# Patient Record
Sex: Female | Born: 1935 | Race: White | Hispanic: No | Smoking: Never smoker
Health system: Southern US, Community
[De-identification: ages and names within clinical notes are randomized; demographics above are authoritative.]

## PROBLEM LIST (undated history)

## (undated) DIAGNOSIS — I440 Atrioventricular block, first degree: Secondary | ICD-10-CM

## (undated) DIAGNOSIS — I442 Atrioventricular block, complete: Secondary | ICD-10-CM

## (undated) DIAGNOSIS — L920 Granuloma annulare: Secondary | ICD-10-CM

## (undated) DIAGNOSIS — R600 Localized edema: Secondary | ICD-10-CM

## (undated) DIAGNOSIS — M48061 Spinal stenosis, lumbar region without neurogenic claudication: Secondary | ICD-10-CM

## (undated) DIAGNOSIS — I10 Essential (primary) hypertension: Secondary | ICD-10-CM

## (undated) DIAGNOSIS — D693 Immune thrombocytopenic purpura: Secondary | ICD-10-CM

## (undated) DIAGNOSIS — I2510 Atherosclerotic heart disease of native coronary artery without angina pectoris: Secondary | ICD-10-CM

## (undated) DIAGNOSIS — A0472 Enterocolitis due to Clostridium difficile, not specified as recurrent: Secondary | ICD-10-CM

## (undated) DIAGNOSIS — IMO0002 Reserved for concepts with insufficient information to code with codable children: Secondary | ICD-10-CM

## (undated) DIAGNOSIS — H409 Unspecified glaucoma: Secondary | ICD-10-CM

## (undated) DIAGNOSIS — Z8672 Personal history of thrombophlebitis: Secondary | ICD-10-CM

## (undated) DIAGNOSIS — I509 Heart failure, unspecified: Secondary | ICD-10-CM

## (undated) DIAGNOSIS — S129XXA Fracture of neck, unspecified, initial encounter: Secondary | ICD-10-CM

## (undated) DIAGNOSIS — T4145XA Adverse effect of unspecified anesthetic, initial encounter: Secondary | ICD-10-CM

## (undated) DIAGNOSIS — T8859XA Other complications of anesthesia, initial encounter: Secondary | ICD-10-CM

## (undated) DIAGNOSIS — E785 Hyperlipidemia, unspecified: Secondary | ICD-10-CM

## (undated) DIAGNOSIS — K559 Vascular disorder of intestine, unspecified: Secondary | ICD-10-CM

## (undated) DIAGNOSIS — I4891 Unspecified atrial fibrillation: Secondary | ICD-10-CM

## (undated) DIAGNOSIS — W19XXXA Unspecified fall, initial encounter: Secondary | ICD-10-CM

## (undated) DIAGNOSIS — N289 Disorder of kidney and ureter, unspecified: Secondary | ICD-10-CM

## (undated) DIAGNOSIS — I428 Other cardiomyopathies: Secondary | ICD-10-CM

## (undated) HISTORY — PX: ROTATOR CUFF REPAIR: SHX139

## (undated) HISTORY — DX: Hyperlipidemia, unspecified: E78.5

## (undated) HISTORY — DX: Atherosclerotic heart disease of native coronary artery without angina pectoris: I25.10

## (undated) HISTORY — DX: Vascular disorder of intestine, unspecified: K55.9

## (undated) HISTORY — DX: Spinal stenosis, lumbar region without neurogenic claudication: M48.061

## (undated) HISTORY — PX: APPENDECTOMY: SHX54

## (undated) HISTORY — DX: Personal history of thrombophlebitis: Z86.72

## (undated) HISTORY — DX: Localized edema: R60.0

## (undated) HISTORY — DX: Atrioventricular block, complete: I44.2

## (undated) HISTORY — DX: Unspecified fall, initial encounter: W19.XXXA

## (undated) HISTORY — DX: Unspecified glaucoma: H40.9

## (undated) HISTORY — DX: Enterocolitis due to Clostridium difficile, not specified as recurrent: A04.72

## (undated) HISTORY — DX: Other cardiomyopathies: I42.8

## (undated) HISTORY — PX: ABDOMINAL HYSTERECTOMY: SHX81

## (undated) HISTORY — DX: Immune thrombocytopenic purpura: D69.3

## (undated) HISTORY — DX: Essential (primary) hypertension: I10

## (undated) HISTORY — DX: Reserved for concepts with insufficient information to code with codable children: IMO0002

## (undated) HISTORY — DX: Atrioventricular block, first degree: I44.0

## (undated) HISTORY — PX: COLONOSCOPY W/ POLYPECTOMY: SHX1380

## (undated) HISTORY — PX: CARDIAC CATHETERIZATION: SHX172

## (undated) HISTORY — DX: Granuloma annulare: L92.0

## (undated) HISTORY — PX: CATARACT EXTRACTION, BILATERAL: SHX1313

## (undated) HISTORY — PX: VERTEBROPLASTY: SHX113

---

## 1999-01-16 ENCOUNTER — Encounter: Payer: Self-pay | Admitting: Orthopedic Surgery

## 1999-01-16 ENCOUNTER — Ambulatory Visit (HOSPITAL_COMMUNITY): Admission: RE | Admit: 1999-01-16 | Discharge: 1999-01-16 | Payer: Self-pay | Admitting: Orthopedic Surgery

## 1999-02-11 ENCOUNTER — Encounter: Payer: Self-pay | Admitting: Orthopedic Surgery

## 1999-02-18 ENCOUNTER — Ambulatory Visit (HOSPITAL_COMMUNITY): Admission: RE | Admit: 1999-02-18 | Discharge: 1999-02-18 | Payer: Self-pay | Admitting: Orthopedic Surgery

## 1999-02-18 ENCOUNTER — Encounter: Payer: Self-pay | Admitting: Orthopedic Surgery

## 2001-09-22 ENCOUNTER — Emergency Department (HOSPITAL_COMMUNITY): Admission: EM | Admit: 2001-09-22 | Discharge: 2001-09-22 | Payer: Self-pay | Admitting: Emergency Medicine

## 2002-07-13 DIAGNOSIS — M48061 Spinal stenosis, lumbar region without neurogenic claudication: Secondary | ICD-10-CM

## 2002-07-13 HISTORY — DX: Spinal stenosis, lumbar region without neurogenic claudication: M48.061

## 2002-11-27 HISTORY — PX: CORNEAL TRANSPLANT: SHX108

## 2003-02-08 ENCOUNTER — Ambulatory Visit (HOSPITAL_BASED_OUTPATIENT_CLINIC_OR_DEPARTMENT_OTHER): Admission: RE | Admit: 2003-02-08 | Discharge: 2003-02-09 | Payer: Self-pay | Admitting: Orthopaedic Surgery

## 2003-12-31 ENCOUNTER — Inpatient Hospital Stay (HOSPITAL_COMMUNITY): Admission: EM | Admit: 2003-12-31 | Discharge: 2004-01-03 | Payer: Self-pay | Admitting: *Deleted

## 2004-06-13 ENCOUNTER — Ambulatory Visit: Payer: Self-pay | Admitting: Family Medicine

## 2004-06-13 ENCOUNTER — Ambulatory Visit (HOSPITAL_COMMUNITY): Admission: RE | Admit: 2004-06-13 | Discharge: 2004-06-13 | Payer: Self-pay | Admitting: Family Medicine

## 2004-06-19 ENCOUNTER — Ambulatory Visit: Payer: Self-pay | Admitting: Internal Medicine

## 2004-06-23 ENCOUNTER — Encounter: Admission: RE | Admit: 2004-06-23 | Discharge: 2004-06-23 | Payer: Self-pay | Admitting: Internal Medicine

## 2004-07-28 ENCOUNTER — Encounter: Admission: RE | Admit: 2004-07-28 | Discharge: 2004-08-25 | Payer: Self-pay | Admitting: Orthopedic Surgery

## 2004-10-11 HISTORY — PX: BACK SURGERY: SHX140

## 2005-08-17 ENCOUNTER — Ambulatory Visit: Payer: Self-pay | Admitting: Internal Medicine

## 2005-08-24 ENCOUNTER — Ambulatory Visit: Payer: Self-pay | Admitting: Hematology and Oncology

## 2005-08-26 ENCOUNTER — Ambulatory Visit: Payer: Self-pay | Admitting: Internal Medicine

## 2005-09-07 ENCOUNTER — Ambulatory Visit: Payer: Self-pay | Admitting: Internal Medicine

## 2005-09-10 HISTORY — PX: COLONOSCOPY: SHX174

## 2005-09-28 ENCOUNTER — Ambulatory Visit: Payer: Self-pay | Admitting: Internal Medicine

## 2005-09-28 ENCOUNTER — Encounter (INDEPENDENT_AMBULATORY_CARE_PROVIDER_SITE_OTHER): Payer: Self-pay | Admitting: Specialist

## 2005-10-13 ENCOUNTER — Ambulatory Visit: Payer: Self-pay | Admitting: Hematology and Oncology

## 2005-10-14 LAB — T4, FREE: Free T4: 1.14 ng/dL (ref 0.89–1.80)

## 2005-10-14 LAB — CBC WITH DIFFERENTIAL/PLATELET
BASO%: 0.7 % (ref 0.0–2.0)
EOS%: 4.6 % (ref 0.0–7.0)
HGB: 12.8 g/dL (ref 11.6–15.9)
MCH: 31.7 pg (ref 26.0–34.0)
MCHC: 35.6 g/dL (ref 32.0–36.0)
RDW: 12 % (ref 11.3–14.5)
WBC: 6.6 10*3/uL (ref 3.9–10.0)
lymph#: 1.4 10*3/uL (ref 0.9–3.3)

## 2005-10-14 LAB — TSH: TSH: 1.471 u[IU]/mL (ref 0.350–5.500)

## 2005-10-14 LAB — IGG, IGA, IGM
IgA: 127 mg/dL (ref 68–378)
IgM, Serum: 92 mg/dL (ref 60–263)

## 2005-10-15 LAB — COMPREHENSIVE METABOLIC PANEL
ALT: 9 U/L (ref 0–40)
Alkaline Phosphatase: 97 U/L (ref 39–117)
Creatinine, Ser: 0.9 mg/dL (ref 0.4–1.2)
Sodium: 141 mEq/L (ref 135–145)
Total Bilirubin: 0.7 mg/dL (ref 0.3–1.2)
Total Protein: 6.6 g/dL (ref 6.0–8.3)

## 2005-10-15 LAB — PROTEIN ELECTROPHORESIS, SERUM
Albumin ELP: 62.5 % (ref 55.8–66.1)
Alpha-1-Globulin: 5.2 % — ABNORMAL HIGH (ref 2.9–4.9)
Beta 2: 3.3 % (ref 3.2–6.5)
Beta Globulin: 6 % (ref 4.7–7.2)

## 2005-11-12 LAB — CBC WITH DIFFERENTIAL/PLATELET
BASO%: 1.1 % (ref 0.0–2.0)
EOS%: 5.1 % (ref 0.0–7.0)
LYMPH%: 21.4 % (ref 14.0–48.0)
MCH: 31.9 pg (ref 26.0–34.0)
MCHC: 34.6 g/dL (ref 32.0–36.0)
MCV: 92.3 fL (ref 81.0–101.0)
MONO%: 5.9 % (ref 0.0–13.0)
NEUT#: 3.6 10*3/uL (ref 1.5–6.5)
Platelets: 103 10*3/uL — ABNORMAL LOW (ref 145–400)
RBC: 4.12 10*6/uL (ref 3.70–5.32)
RDW: 13.1 % (ref 11.3–14.5)

## 2005-11-12 LAB — VITAMIN B12: Vitamin B-12: 426 pg/mL (ref 211–911)

## 2006-01-14 ENCOUNTER — Ambulatory Visit: Payer: Self-pay | Admitting: Hematology and Oncology

## 2006-01-21 LAB — CBC WITH DIFFERENTIAL/PLATELET
BASO%: 0.4 % (ref 0.0–2.0)
Eosinophils Absolute: 0.2 10*3/uL (ref 0.0–0.5)
MCHC: 35.2 g/dL (ref 32.0–36.0)
MCV: 91.8 fL (ref 81.0–101.0)
MONO#: 0.5 10*3/uL (ref 0.1–0.9)
MONO%: 7.7 % (ref 0.0–13.0)
NEUT#: 3.8 10*3/uL (ref 1.5–6.5)
RBC: 4.12 10*6/uL (ref 3.70–5.32)
RDW: 13.5 % (ref 11.3–14.5)
WBC: 5.9 10*3/uL (ref 3.9–10.0)

## 2006-01-21 LAB — COMPREHENSIVE METABOLIC PANEL
ALT: 13 U/L (ref 0–40)
Albumin: 4.3 g/dL (ref 3.5–5.2)
Alkaline Phosphatase: 112 U/L (ref 39–117)
Glucose, Bld: 86 mg/dL (ref 70–99)
Potassium: 4.3 mEq/L (ref 3.5–5.3)
Sodium: 137 mEq/L (ref 135–145)
Total Protein: 6.4 g/dL (ref 6.0–8.3)

## 2006-02-04 ENCOUNTER — Ambulatory Visit (HOSPITAL_COMMUNITY): Admission: RE | Admit: 2006-02-04 | Discharge: 2006-02-04 | Payer: Self-pay | Admitting: Hematology and Oncology

## 2006-02-18 LAB — CBC WITH DIFFERENTIAL/PLATELET
Eosinophils Absolute: 0.2 10*3/uL (ref 0.0–0.5)
LYMPH%: 22.8 % (ref 14.0–48.0)
MCHC: 35 g/dL (ref 32.0–36.0)
MCV: 91.8 fL (ref 81.0–101.0)
MONO%: 7.4 % (ref 0.0–13.0)
NEUT#: 3.9 10*3/uL (ref 1.5–6.5)
Platelets: 100 10*3/uL — ABNORMAL LOW (ref 145–400)
RBC: 4.28 10*6/uL (ref 3.70–5.32)

## 2006-02-18 LAB — BASIC METABOLIC PANEL
Calcium: 9.5 mg/dL (ref 8.4–10.5)
Creatinine, Ser: 0.91 mg/dL (ref 0.40–1.20)
Glucose, Bld: 98 mg/dL (ref 70–99)
Sodium: 141 mEq/L (ref 135–145)

## 2006-03-12 ENCOUNTER — Ambulatory Visit: Payer: Self-pay | Admitting: Hematology and Oncology

## 2006-04-08 ENCOUNTER — Ambulatory Visit: Payer: Self-pay | Admitting: Internal Medicine

## 2006-04-20 ENCOUNTER — Ambulatory Visit: Payer: Self-pay | Admitting: Hematology and Oncology

## 2006-04-22 LAB — BASIC METABOLIC PANEL
CO2: 26 mEq/L (ref 19–32)
Calcium: 9.3 mg/dL (ref 8.4–10.5)
Creatinine, Ser: 0.89 mg/dL (ref 0.40–1.20)
Glucose, Bld: 137 mg/dL — ABNORMAL HIGH (ref 70–99)

## 2006-04-22 LAB — CBC WITH DIFFERENTIAL/PLATELET
Basophils Absolute: 0 10*3/uL (ref 0.0–0.1)
Eosinophils Absolute: 0.3 10*3/uL (ref 0.0–0.5)
HCT: 36.3 % (ref 34.8–46.6)
LYMPH%: 18.4 % (ref 14.0–48.0)
MONO#: 0.3 10*3/uL (ref 0.1–0.9)
NEUT#: 4 10*3/uL (ref 1.5–6.5)
NEUT%: 71.9 % (ref 39.6–76.8)
Platelets: 121 10*3/uL — ABNORMAL LOW (ref 145–400)
WBC: 5.6 10*3/uL (ref 3.9–10.0)

## 2006-04-26 ENCOUNTER — Ambulatory Visit (HOSPITAL_COMMUNITY): Admission: RE | Admit: 2006-04-26 | Discharge: 2006-04-26 | Payer: Self-pay | Admitting: Hematology and Oncology

## 2006-04-30 ENCOUNTER — Ambulatory Visit: Payer: Self-pay | Admitting: Internal Medicine

## 2006-04-30 ENCOUNTER — Inpatient Hospital Stay (HOSPITAL_COMMUNITY): Admission: AD | Admit: 2006-04-30 | Discharge: 2006-05-02 | Payer: Self-pay | Admitting: Internal Medicine

## 2006-05-01 ENCOUNTER — Ambulatory Visit: Payer: Self-pay | Admitting: Endocrinology

## 2006-05-01 ENCOUNTER — Encounter (INDEPENDENT_AMBULATORY_CARE_PROVIDER_SITE_OTHER): Payer: Self-pay | Admitting: Specialist

## 2006-05-01 HISTORY — PX: OTHER SURGICAL HISTORY: SHX169

## 2006-05-07 ENCOUNTER — Ambulatory Visit: Payer: Self-pay | Admitting: Internal Medicine

## 2006-05-07 LAB — CONVERTED CEMR LAB
Basophils Relative: 0.8 % (ref 0.0–1.0)
Chol/HDL Ratio, serum: 5.5
Eosinophil percent: 6 % — ABNORMAL HIGH (ref 0.0–5.0)
HDL: 27.4 mg/dL — ABNORMAL LOW (ref >39.0)
LDL Cholesterol: 97 mg/dL (ref 0–99)
MCHC: 34.2 g/dL (ref 30.0–36.0)
MCV: 93.7 fL (ref 78.0–100.0)
Neutro Abs: 3.7 10*3/uL (ref 1.4–7.7)
Platelets: 108 10*3/uL — ABNORMAL LOW (ref 150–400)
RDW: 12.3 % (ref 11.5–14.6)
Triglyceride fasting, serum: 126 mg/dL (ref 0–149)

## 2006-05-14 ENCOUNTER — Ambulatory Visit: Payer: Self-pay | Admitting: Internal Medicine

## 2006-06-07 ENCOUNTER — Ambulatory Visit: Payer: Self-pay | Admitting: Hematology and Oncology

## 2006-06-08 ENCOUNTER — Ambulatory Visit: Payer: Self-pay | Admitting: Internal Medicine

## 2006-08-02 ENCOUNTER — Ambulatory Visit: Payer: Self-pay | Admitting: Hematology and Oncology

## 2006-08-19 ENCOUNTER — Ambulatory Visit: Payer: Self-pay | Admitting: Internal Medicine

## 2006-08-19 DIAGNOSIS — Z860100 Personal history of colon polyps, unspecified: Secondary | ICD-10-CM | POA: Insufficient documentation

## 2006-08-19 DIAGNOSIS — L538 Other specified erythematous conditions: Secondary | ICD-10-CM

## 2006-08-19 DIAGNOSIS — M810 Age-related osteoporosis without current pathological fracture: Secondary | ICD-10-CM

## 2006-08-19 LAB — BASIC METABOLIC PANEL WITH GFR
BUN: 19 mg/dL (ref 6–23)
CO2: 26 meq/L (ref 19–32)
Calcium: 9.9 mg/dL (ref 8.4–10.5)
Chloride: 106 meq/L (ref 96–112)
Creatinine, Ser: 1 mg/dL (ref 0.40–1.20)
Glucose, Bld: 110 mg/dL — ABNORMAL HIGH (ref 70–99)
Potassium: 4.1 meq/L (ref 3.5–5.3)
Sodium: 141 meq/L (ref 135–145)

## 2006-08-19 LAB — CBC WITH DIFFERENTIAL/PLATELET
Basophils Absolute: 0 10*3/uL (ref 0.0–0.1)
Eosinophils Absolute: 0.2 10*3/uL (ref 0.0–0.5)
HGB: 11.7 g/dL (ref 11.6–15.9)
MONO#: 0.3 10*3/uL (ref 0.1–0.9)
NEUT#: 3.1 10*3/uL (ref 1.5–6.5)
RDW: 14 % (ref 11.3–14.5)
lymph#: 1 10*3/uL (ref 0.9–3.3)

## 2006-09-20 ENCOUNTER — Ambulatory Visit: Payer: Self-pay | Admitting: Hematology and Oncology

## 2006-09-23 LAB — CBC WITH DIFFERENTIAL/PLATELET
BASO%: 0.6 % (ref 0.0–2.0)
Basophils Absolute: 0 10e3/uL (ref 0.0–0.1)
EOS%: 5.2 % (ref 0.0–7.0)
Eosinophils Absolute: 0.3 10e3/uL (ref 0.0–0.5)
HCT: 35 % (ref 34.8–46.6)
HGB: 12.4 g/dL (ref 11.6–15.9)
LYMPH%: 25.2 % (ref 14.0–48.0)
MCH: 32.5 pg (ref 26.0–34.0)
MCHC: 35.5 g/dL (ref 32.0–36.0)
MCV: 91.5 fL (ref 81.0–101.0)
MONO#: 0.4 10e3/uL (ref 0.1–0.9)
MONO%: 7.5 % (ref 0.0–13.0)
NEUT#: 3.1 10e3/uL (ref 1.5–6.5)
NEUT%: 61.5 % (ref 39.6–76.8)
Platelets: 87 10e3/uL — ABNORMAL LOW (ref 145–400)
RBC: 3.82 10e6/uL (ref 3.70–5.32)
RDW: 13.2 % (ref 11.3–14.5)
WBC: 5.1 10e3/uL (ref 3.9–10.0)
lymph#: 1.3 10e3/uL (ref 0.9–3.3)

## 2006-10-21 LAB — CBC WITH DIFFERENTIAL/PLATELET
BASO%: 1.3 % (ref 0.0–2.0)
EOS%: 4.6 % (ref 0.0–7.0)
LYMPH%: 22.5 % (ref 14.0–48.0)
MCH: 33 pg (ref 26.0–34.0)
MCHC: 35.8 g/dL (ref 32.0–36.0)
MCV: 92.2 fL (ref 81.0–101.0)
MONO#: 0.4 10*3/uL (ref 0.1–0.9)
MONO%: 6 % (ref 0.0–13.0)
Platelets: 89 10*3/uL — ABNORMAL LOW (ref 145–400)
RBC: 3.81 10*6/uL (ref 3.70–5.32)
WBC: 6.1 10*3/uL (ref 3.9–10.0)

## 2006-11-16 ENCOUNTER — Ambulatory Visit: Payer: Self-pay | Admitting: Hematology and Oncology

## 2006-11-18 LAB — CBC WITH DIFFERENTIAL/PLATELET
Basophils Absolute: 0 10*3/uL (ref 0.0–0.1)
EOS%: 4 % (ref 0.0–7.0)
Eosinophils Absolute: 0.2 10*3/uL (ref 0.0–0.5)
HGB: 12.6 g/dL (ref 11.6–15.9)
MCV: 92 fL (ref 81.0–101.0)
MONO%: 6.1 % (ref 0.0–13.0)
NEUT#: 3.8 10*3/uL (ref 1.5–6.5)
RBC: 3.82 10*6/uL (ref 3.70–5.32)
RDW: 13.5 % (ref 11.3–14.5)
lymph#: 1.3 10*3/uL (ref 0.9–3.3)

## 2006-11-18 LAB — COMPREHENSIVE METABOLIC PANEL
AST: 15 U/L (ref 0–37)
Albumin: 4.1 g/dL (ref 3.5–5.2)
Alkaline Phosphatase: 99 U/L (ref 39–117)
Calcium: 10.2 mg/dL (ref 8.4–10.5)
Chloride: 105 mEq/L (ref 96–112)
Glucose, Bld: 97 mg/dL (ref 70–99)
Potassium: 4.6 mEq/L (ref 3.5–5.3)
Sodium: 140 mEq/L (ref 135–145)
Total Protein: 6.5 g/dL (ref 6.0–8.3)

## 2006-12-16 LAB — CBC WITH DIFFERENTIAL/PLATELET
BASO%: 0.3 % (ref 0.0–2.0)
EOS%: 3.9 % (ref 0.0–7.0)
LYMPH%: 25.4 % (ref 14.0–48.0)
MCH: 33 pg (ref 26.0–34.0)
MCHC: 35.7 g/dL (ref 32.0–36.0)
MCV: 92.4 fL (ref 81.0–101.0)
MONO%: 7.1 % (ref 0.0–13.0)
Platelets: 88 10*3/uL — ABNORMAL LOW (ref 145–400)
RBC: 3.88 10*6/uL (ref 3.70–5.32)
RDW: 14 % (ref 11.3–14.5)

## 2007-01-11 ENCOUNTER — Ambulatory Visit: Payer: Self-pay | Admitting: Hematology and Oncology

## 2007-01-13 LAB — COMPREHENSIVE METABOLIC PANEL
ALT: 8 U/L (ref 0–35)
Albumin: 4.4 g/dL (ref 3.5–5.2)
CO2: 27 mEq/L (ref 19–32)
Potassium: 4.6 mEq/L (ref 3.5–5.3)
Sodium: 140 mEq/L (ref 135–145)
Total Bilirubin: 0.9 mg/dL (ref 0.3–1.2)
Total Protein: 6.8 g/dL (ref 6.0–8.3)

## 2007-01-13 LAB — CBC WITH DIFFERENTIAL/PLATELET
BASO%: 3.3 % — ABNORMAL HIGH (ref 0.0–2.0)
LYMPH%: 23.3 % (ref 14.0–48.0)
MCHC: 35.7 g/dL (ref 32.0–36.0)
MONO#: 0.4 10*3/uL (ref 0.1–0.9)
NEUT#: 3.5 10*3/uL (ref 1.5–6.5)
Platelets: 104 10*3/uL — ABNORMAL LOW (ref 145–400)
RBC: 3.99 10*6/uL (ref 3.70–5.32)
RDW: 13.2 % (ref 11.3–14.5)
WBC: 5.7 10*3/uL (ref 3.9–10.0)
lymph#: 1.3 10*3/uL (ref 0.9–3.3)

## 2007-03-11 ENCOUNTER — Ambulatory Visit: Payer: Self-pay | Admitting: Hematology and Oncology

## 2007-03-16 LAB — CBC WITH DIFFERENTIAL/PLATELET
BASO%: 0.4 % (ref 0.0–2.0)
EOS%: 4.1 % (ref 0.0–7.0)
HCT: 38.8 % (ref 34.8–46.6)
LYMPH%: 22.6 % (ref 14.0–48.0)
MCH: 33.4 pg (ref 26.0–34.0)
MCHC: 35.8 g/dL (ref 32.0–36.0)
NEUT%: 66.7 % (ref 39.6–76.8)
RBC: 4.15 10*6/uL (ref 3.70–5.32)
lymph#: 1.3 10*3/uL (ref 0.9–3.3)

## 2007-03-16 LAB — COMPREHENSIVE METABOLIC PANEL
ALT: 9 U/L (ref 0–35)
AST: 13 U/L (ref 0–37)
Creatinine, Ser: 1.14 mg/dL (ref 0.40–1.20)
Total Bilirubin: 0.7 mg/dL (ref 0.3–1.2)

## 2007-04-14 ENCOUNTER — Encounter: Payer: Self-pay | Admitting: Internal Medicine

## 2007-05-09 ENCOUNTER — Ambulatory Visit: Payer: Self-pay | Admitting: Hematology and Oncology

## 2007-07-01 ENCOUNTER — Ambulatory Visit: Payer: Self-pay | Admitting: Hematology and Oncology

## 2007-07-05 ENCOUNTER — Encounter: Payer: Self-pay | Admitting: Internal Medicine

## 2007-07-05 LAB — CBC WITH DIFFERENTIAL/PLATELET
Basophils Absolute: 0.1 10*3/uL (ref 0.0–0.1)
EOS%: 4.3 % (ref 0.0–7.0)
HCT: 37.8 % (ref 34.8–46.6)
HGB: 13.3 g/dL (ref 11.6–15.9)
MCH: 33.2 pg (ref 26.0–34.0)
MCV: 94.8 fL (ref 81.0–101.0)
MONO%: 7.6 % (ref 0.0–13.0)
NEUT%: 60 % (ref 39.6–76.8)
Platelets: 106 10*3/uL — ABNORMAL LOW (ref 145–400)

## 2007-07-06 LAB — COMPREHENSIVE METABOLIC PANEL
ALT: 9 U/L (ref 0–35)
AST: 11 U/L (ref 0–37)
Albumin: 4.3 g/dL (ref 3.5–5.2)
Alkaline Phosphatase: 91 U/L (ref 39–117)
BUN: 23 mg/dL (ref 6–23)
CO2: 26 mEq/L (ref 19–32)
Calcium: 10 mg/dL (ref 8.4–10.5)
Chloride: 103 mEq/L (ref 96–112)
Creatinine, Ser: 1 mg/dL (ref 0.40–1.20)
Glucose, Bld: 99 mg/dL (ref 70–99)
Potassium: 3.9 mEq/L (ref 3.5–5.3)
Sodium: 143 mEq/L (ref 135–145)
Total Bilirubin: 0.8 mg/dL (ref 0.3–1.2)
Total Protein: 6.7 g/dL (ref 6.0–8.3)

## 2007-07-06 LAB — VITAMIN B12: Vitamin B-12: 467 pg/mL (ref 211–911)

## 2007-08-26 ENCOUNTER — Ambulatory Visit: Payer: Self-pay | Admitting: Hematology and Oncology

## 2007-09-19 DIAGNOSIS — Z8719 Personal history of other diseases of the digestive system: Secondary | ICD-10-CM

## 2007-09-19 DIAGNOSIS — I10 Essential (primary) hypertension: Secondary | ICD-10-CM | POA: Insufficient documentation

## 2007-09-19 DIAGNOSIS — I809 Phlebitis and thrombophlebitis of unspecified site: Secondary | ICD-10-CM | POA: Insufficient documentation

## 2007-09-27 ENCOUNTER — Ambulatory Visit: Payer: Self-pay | Admitting: Hematology and Oncology

## 2007-10-17 ENCOUNTER — Telehealth (INDEPENDENT_AMBULATORY_CARE_PROVIDER_SITE_OTHER): Payer: Self-pay | Admitting: *Deleted

## 2007-10-25 LAB — CBC WITH DIFFERENTIAL/PLATELET
Basophils Absolute: 0 10*3/uL (ref 0.0–0.1)
Eosinophils Absolute: 0.3 10*3/uL (ref 0.0–0.5)
HCT: 33.9 % — ABNORMAL LOW (ref 34.8–46.6)
HGB: 12 g/dL (ref 11.6–15.9)
MCV: 93.5 fL (ref 81.0–101.0)
MONO%: 7.1 % (ref 0.0–13.0)
NEUT#: 3.3 10*3/uL (ref 1.5–6.5)
NEUT%: 62.1 % (ref 39.6–76.8)
RDW: 13.4 % (ref 11.3–14.5)
lymph#: 1.4 10*3/uL (ref 0.9–3.3)

## 2007-10-25 LAB — COMPREHENSIVE METABOLIC PANEL
Albumin: 4 g/dL (ref 3.5–5.2)
BUN: 18 mg/dL (ref 6–23)
Calcium: 9.2 mg/dL (ref 8.4–10.5)
Chloride: 104 mEq/L (ref 96–112)
Glucose, Bld: 178 mg/dL — ABNORMAL HIGH (ref 70–99)
Potassium: 3.5 mEq/L (ref 3.5–5.3)

## 2007-11-07 ENCOUNTER — Ambulatory Visit: Payer: Self-pay | Admitting: Internal Medicine

## 2007-11-07 ENCOUNTER — Telehealth (INDEPENDENT_AMBULATORY_CARE_PROVIDER_SITE_OTHER): Payer: Self-pay | Admitting: *Deleted

## 2007-11-07 DIAGNOSIS — M79609 Pain in unspecified limb: Secondary | ICD-10-CM

## 2007-11-07 DIAGNOSIS — M7120 Synovial cyst of popliteal space [Baker], unspecified knee: Secondary | ICD-10-CM | POA: Insufficient documentation

## 2007-11-08 ENCOUNTER — Ambulatory Visit: Payer: Self-pay

## 2007-11-08 ENCOUNTER — Encounter: Payer: Self-pay | Admitting: Internal Medicine

## 2007-11-14 ENCOUNTER — Telehealth: Payer: Self-pay | Admitting: Internal Medicine

## 2007-11-18 ENCOUNTER — Ambulatory Visit: Payer: Self-pay | Admitting: Hematology and Oncology

## 2007-11-19 ENCOUNTER — Encounter: Admission: RE | Admit: 2007-11-19 | Discharge: 2007-11-19 | Payer: Self-pay | Admitting: Orthopedic Surgery

## 2007-11-24 ENCOUNTER — Encounter: Payer: Self-pay | Admitting: Internal Medicine

## 2007-12-01 ENCOUNTER — Other Ambulatory Visit: Admission: RE | Admit: 2007-12-01 | Discharge: 2007-12-01 | Payer: Self-pay | Admitting: Hematology and Oncology

## 2007-12-01 ENCOUNTER — Encounter: Payer: Self-pay | Admitting: Hematology and Oncology

## 2007-12-13 ENCOUNTER — Telehealth (INDEPENDENT_AMBULATORY_CARE_PROVIDER_SITE_OTHER): Payer: Self-pay | Admitting: *Deleted

## 2007-12-13 ENCOUNTER — Encounter: Payer: Self-pay | Admitting: Internal Medicine

## 2007-12-13 ENCOUNTER — Observation Stay (HOSPITAL_COMMUNITY): Admission: EM | Admit: 2007-12-13 | Discharge: 2007-12-14 | Payer: Self-pay | Admitting: Emergency Medicine

## 2007-12-13 ENCOUNTER — Ambulatory Visit: Payer: Self-pay | Admitting: Internal Medicine

## 2007-12-15 ENCOUNTER — Telehealth: Payer: Self-pay | Admitting: Physician Assistant

## 2007-12-20 ENCOUNTER — Ambulatory Visit: Payer: Self-pay | Admitting: Internal Medicine

## 2007-12-20 LAB — CONVERTED CEMR LAB
Calcium: 9.3 mg/dL (ref 8.4–10.5)
GFR calc non Af Amer: 64 mL/min
Glucose, Bld: 107 mg/dL — ABNORMAL HIGH (ref 70–99)
Sodium: 139 meq/L (ref 135–145)

## 2008-01-12 ENCOUNTER — Ambulatory Visit: Payer: Self-pay | Admitting: Hematology and Oncology

## 2008-02-01 ENCOUNTER — Ambulatory Visit: Payer: Self-pay | Admitting: Cardiology

## 2008-03-23 ENCOUNTER — Ambulatory Visit: Payer: Self-pay | Admitting: Hematology and Oncology

## 2008-03-27 ENCOUNTER — Encounter: Payer: Self-pay | Admitting: Internal Medicine

## 2008-03-27 LAB — BASIC METABOLIC PANEL
CO2: 25 mEq/L (ref 19–32)
Calcium: 9.5 mg/dL (ref 8.4–10.5)
Chloride: 106 mEq/L (ref 96–112)
Potassium: 4 mEq/L (ref 3.5–5.3)
Sodium: 141 mEq/L (ref 135–145)

## 2008-03-27 LAB — CBC WITH DIFFERENTIAL/PLATELET
BASO%: 0.5 % (ref 0.0–2.0)
Basophils Absolute: 0 10*3/uL (ref 0.0–0.1)
EOS%: 7.7 % — ABNORMAL HIGH (ref 0.0–7.0)
HCT: 33.3 % — ABNORMAL LOW (ref 34.8–46.6)
HGB: 11.9 g/dL (ref 11.6–15.9)
LYMPH%: 25.3 % (ref 14.0–48.0)
MCH: 33.6 pg (ref 26.0–34.0)
MCHC: 35.7 g/dL (ref 32.0–36.0)
MCV: 94.1 fL (ref 81.0–101.0)
MONO%: 6.9 % (ref 0.0–13.0)
NEUT%: 59.6 % (ref 39.6–76.8)
Platelets: 91 10*3/uL — ABNORMAL LOW (ref 145–400)
lymph#: 1.6 10*3/uL (ref 0.9–3.3)

## 2008-04-12 DIAGNOSIS — I428 Other cardiomyopathies: Secondary | ICD-10-CM

## 2008-04-12 HISTORY — DX: Other cardiomyopathies: I42.8

## 2008-05-07 ENCOUNTER — Ambulatory Visit: Payer: Self-pay

## 2008-05-07 ENCOUNTER — Encounter: Payer: Self-pay | Admitting: Internal Medicine

## 2008-05-07 ENCOUNTER — Ambulatory Visit: Payer: Self-pay | Admitting: Internal Medicine

## 2008-05-16 ENCOUNTER — Ambulatory Visit: Payer: Self-pay | Admitting: Internal Medicine

## 2008-05-16 LAB — CONVERTED CEMR LAB
CO2: 30 meq/L (ref 19–32)
Chloride: 110 meq/L (ref 96–112)
GFR calc Af Amer: 68 mL/min
GFR calc non Af Amer: 56 mL/min
Potassium: 4 meq/L (ref 3.5–5.1)
Sodium: 144 meq/L (ref 135–145)

## 2008-05-18 ENCOUNTER — Ambulatory Visit: Payer: Self-pay | Admitting: Hematology and Oncology

## 2008-05-22 ENCOUNTER — Ambulatory Visit: Payer: Self-pay | Admitting: Internal Medicine

## 2008-05-22 DIAGNOSIS — D510 Vitamin B12 deficiency anemia due to intrinsic factor deficiency: Secondary | ICD-10-CM

## 2008-05-22 DIAGNOSIS — I5181 Takotsubo syndrome: Secondary | ICD-10-CM

## 2008-05-22 DIAGNOSIS — Z8679 Personal history of other diseases of the circulatory system: Secondary | ICD-10-CM

## 2008-05-23 ENCOUNTER — Encounter (INDEPENDENT_AMBULATORY_CARE_PROVIDER_SITE_OTHER): Payer: Self-pay | Admitting: *Deleted

## 2008-06-12 ENCOUNTER — Encounter (INDEPENDENT_AMBULATORY_CARE_PROVIDER_SITE_OTHER): Payer: Self-pay | Admitting: *Deleted

## 2008-07-12 ENCOUNTER — Ambulatory Visit: Payer: Self-pay | Admitting: Hematology and Oncology

## 2008-09-07 ENCOUNTER — Ambulatory Visit: Payer: Self-pay | Admitting: Hematology and Oncology

## 2008-09-11 ENCOUNTER — Encounter: Payer: Self-pay | Admitting: Internal Medicine

## 2008-09-11 LAB — CBC WITH DIFFERENTIAL/PLATELET
Basophils Absolute: 0 10*3/uL (ref 0.0–0.1)
Eosinophils Absolute: 0.3 10*3/uL (ref 0.0–0.5)
HGB: 12.9 g/dL (ref 11.6–15.9)
MCV: 94 fL (ref 79.5–101.0)
MONO#: 0.4 10*3/uL (ref 0.1–0.9)
MONO%: 7 % (ref 0.0–14.0)
NEUT#: 3.3 10*3/uL (ref 1.5–6.5)
RBC: 3.9 10*6/uL (ref 3.70–5.45)
RDW: 13.5 % (ref 11.2–14.5)
WBC: 5.3 10*3/uL (ref 3.9–10.3)

## 2008-09-11 LAB — BASIC METABOLIC PANEL
BUN: 31 mg/dL — ABNORMAL HIGH (ref 6–23)
CO2: 23 mEq/L (ref 19–32)
Chloride: 106 mEq/L (ref 96–112)
Glucose, Bld: 100 mg/dL — ABNORMAL HIGH (ref 70–99)
Potassium: 4.2 mEq/L (ref 3.5–5.3)
Sodium: 141 mEq/L (ref 135–145)

## 2008-09-11 LAB — VITAMIN B12: Vitamin B-12: 717 pg/mL (ref 211–911)

## 2008-09-17 ENCOUNTER — Ambulatory Visit: Payer: Self-pay | Admitting: Internal Medicine

## 2008-09-17 DIAGNOSIS — R5383 Other fatigue: Secondary | ICD-10-CM

## 2008-09-17 DIAGNOSIS — R5381 Other malaise: Secondary | ICD-10-CM

## 2008-09-17 DIAGNOSIS — M542 Cervicalgia: Secondary | ICD-10-CM

## 2008-09-18 ENCOUNTER — Ambulatory Visit: Payer: Self-pay | Admitting: Internal Medicine

## 2008-09-18 ENCOUNTER — Encounter: Payer: Self-pay | Admitting: Internal Medicine

## 2008-09-19 ENCOUNTER — Ambulatory Visit: Payer: Self-pay | Admitting: Internal Medicine

## 2008-09-20 ENCOUNTER — Encounter (INDEPENDENT_AMBULATORY_CARE_PROVIDER_SITE_OTHER): Payer: Self-pay | Admitting: *Deleted

## 2008-10-16 ENCOUNTER — Ambulatory Visit: Payer: Self-pay | Admitting: Internal Medicine

## 2008-11-02 ENCOUNTER — Ambulatory Visit: Payer: Self-pay | Admitting: Hematology and Oncology

## 2009-01-01 ENCOUNTER — Ambulatory Visit: Payer: Self-pay | Admitting: Hematology and Oncology

## 2009-02-22 ENCOUNTER — Ambulatory Visit: Payer: Self-pay | Admitting: Hematology and Oncology

## 2009-03-11 ENCOUNTER — Ambulatory Visit: Payer: Self-pay | Admitting: Internal Medicine

## 2009-03-11 DIAGNOSIS — R32 Unspecified urinary incontinence: Secondary | ICD-10-CM

## 2009-03-13 ENCOUNTER — Telehealth (INDEPENDENT_AMBULATORY_CARE_PROVIDER_SITE_OTHER): Payer: Self-pay | Admitting: *Deleted

## 2009-03-13 ENCOUNTER — Encounter: Payer: Self-pay | Admitting: Internal Medicine

## 2009-03-13 LAB — BASIC METABOLIC PANEL
CO2: 22 mEq/L (ref 19–32)
Calcium: 9.6 mg/dL (ref 8.4–10.5)
Chloride: 106 mEq/L (ref 96–112)
Sodium: 139 mEq/L (ref 135–145)

## 2009-03-13 LAB — CBC WITH DIFFERENTIAL/PLATELET
BASO%: 0.5 % (ref 0.0–2.0)
LYMPH%: 20.6 % (ref 14.0–49.7)
MCHC: 34.7 g/dL (ref 31.5–36.0)
MCV: 94.6 fL (ref 79.5–101.0)
MONO%: 5.8 % (ref 0.0–14.0)
NEUT%: 69.1 % (ref 38.4–76.8)
Platelets: 85 10*3/uL — ABNORMAL LOW (ref 145–400)
RBC: 3.67 10*6/uL — ABNORMAL LOW (ref 3.70–5.45)

## 2009-04-08 ENCOUNTER — Ambulatory Visit: Payer: Self-pay | Admitting: Hematology and Oncology

## 2009-05-08 ENCOUNTER — Ambulatory Visit: Payer: Self-pay | Admitting: Hematology and Oncology

## 2009-06-03 ENCOUNTER — Ambulatory Visit: Payer: Self-pay | Admitting: Hematology and Oncology

## 2009-07-04 ENCOUNTER — Ambulatory Visit: Payer: Self-pay | Admitting: Oncology

## 2009-07-11 ENCOUNTER — Ambulatory Visit: Payer: Self-pay | Admitting: Internal Medicine

## 2009-08-22 ENCOUNTER — Telehealth (INDEPENDENT_AMBULATORY_CARE_PROVIDER_SITE_OTHER): Payer: Self-pay | Admitting: *Deleted

## 2009-08-26 ENCOUNTER — Ambulatory Visit: Payer: Self-pay | Admitting: Oncology

## 2009-09-23 ENCOUNTER — Ambulatory Visit: Payer: Self-pay | Admitting: Oncology

## 2009-09-25 ENCOUNTER — Encounter: Payer: Self-pay | Admitting: Internal Medicine

## 2009-09-25 LAB — BASIC METABOLIC PANEL
CO2: 24 mEq/L (ref 19–32)
Calcium: 9.8 mg/dL (ref 8.4–10.5)
Chloride: 103 mEq/L (ref 96–112)
Creatinine, Ser: 1.57 mg/dL — ABNORMAL HIGH (ref 0.40–1.20)
Potassium: 3.8 mEq/L (ref 3.5–5.3)
Sodium: 140 mEq/L (ref 135–145)

## 2009-09-25 LAB — CBC WITH DIFFERENTIAL/PLATELET
BASO%: 0.4 % (ref 0.0–2.0)
MCH: 33.5 pg (ref 25.1–34.0)
MCHC: 35.5 g/dL (ref 31.5–36.0)
MCV: 94.3 fL (ref 79.5–101.0)
MONO%: 8.2 % (ref 0.0–14.0)
NEUT#: 3.6 10*3/uL (ref 1.5–6.5)
RDW: 13.3 % (ref 11.2–14.5)
lymph#: 1.3 10*3/uL (ref 0.9–3.3)

## 2009-10-02 ENCOUNTER — Encounter: Payer: Self-pay | Admitting: Internal Medicine

## 2009-10-02 ENCOUNTER — Ambulatory Visit: Payer: Self-pay | Admitting: Family Medicine

## 2009-10-03 ENCOUNTER — Telehealth: Payer: Self-pay | Admitting: Family Medicine

## 2009-10-07 ENCOUNTER — Ambulatory Visit: Payer: Self-pay | Admitting: Internal Medicine

## 2009-10-07 DIAGNOSIS — I428 Other cardiomyopathies: Secondary | ICD-10-CM | POA: Insufficient documentation

## 2009-10-24 ENCOUNTER — Ambulatory Visit: Payer: Self-pay | Admitting: Oncology

## 2009-11-08 ENCOUNTER — Ambulatory Visit: Payer: Self-pay | Admitting: Internal Medicine

## 2009-11-11 LAB — CONVERTED CEMR LAB
Hgb A1c MFr Bld: 5.7 % (ref 4.6–6.5)
Potassium: 4.2 meq/L (ref 3.5–5.1)

## 2009-11-14 ENCOUNTER — Ambulatory Visit: Payer: Self-pay | Admitting: Internal Medicine

## 2009-11-14 DIAGNOSIS — M2550 Pain in unspecified joint: Secondary | ICD-10-CM | POA: Insufficient documentation

## 2009-12-10 ENCOUNTER — Telehealth: Payer: Self-pay | Admitting: Internal Medicine

## 2009-12-16 ENCOUNTER — Ambulatory Visit: Payer: Self-pay | Admitting: Oncology

## 2010-01-15 ENCOUNTER — Ambulatory Visit: Payer: Self-pay | Admitting: Oncology

## 2010-03-10 ENCOUNTER — Ambulatory Visit: Payer: Self-pay | Admitting: Oncology

## 2010-03-13 ENCOUNTER — Ambulatory Visit: Payer: Self-pay | Admitting: Internal Medicine

## 2010-04-09 ENCOUNTER — Ambulatory Visit: Payer: Self-pay | Admitting: Oncology

## 2010-04-09 ENCOUNTER — Encounter: Payer: Self-pay | Admitting: Internal Medicine

## 2010-04-09 LAB — CBC WITH DIFFERENTIAL/PLATELET
BASO%: 0.3 % (ref 0.0–2.0)
HGB: 11.9 g/dL (ref 11.6–15.9)
LYMPH%: 21.8 % (ref 14.0–49.7)
MCH: 33 pg (ref 25.1–34.0)
MCV: 93.7 fL (ref 79.5–101.0)
NEUT#: 3.6 10*3/uL (ref 1.5–6.5)
NEUT%: 64 % (ref 38.4–76.8)
RBC: 3.61 10*6/uL — ABNORMAL LOW (ref 3.70–5.45)
RDW: 13.4 % (ref 11.2–14.5)

## 2010-04-09 LAB — COMPREHENSIVE METABOLIC PANEL
ALT: 11 U/L (ref 0–35)
Calcium: 9 mg/dL (ref 8.4–10.5)
Creatinine, Ser: 1.48 mg/dL — ABNORMAL HIGH (ref 0.40–1.20)
Potassium: 3.8 mEq/L (ref 3.5–5.3)
Total Protein: 5.8 g/dL — ABNORMAL LOW (ref 6.0–8.3)

## 2010-04-09 LAB — VITAMIN B12: Vitamin B-12: 681 pg/mL (ref 211–911)

## 2010-05-12 ENCOUNTER — Encounter: Payer: Self-pay | Admitting: Internal Medicine

## 2010-05-14 ENCOUNTER — Encounter: Admission: RE | Admit: 2010-05-14 | Discharge: 2010-05-14 | Payer: Self-pay | Admitting: Neurosurgery

## 2010-06-02 ENCOUNTER — Ambulatory Visit: Payer: Self-pay | Admitting: Oncology

## 2010-07-02 ENCOUNTER — Ambulatory Visit: Payer: Self-pay | Admitting: Oncology

## 2010-07-10 ENCOUNTER — Encounter: Payer: Self-pay | Admitting: Internal Medicine

## 2010-07-19 ENCOUNTER — Ambulatory Visit
Admission: RE | Admit: 2010-07-19 | Discharge: 2010-07-19 | Payer: Self-pay | Source: Home / Self Care | Attending: Family Medicine | Admitting: Family Medicine

## 2010-07-19 DIAGNOSIS — M549 Dorsalgia, unspecified: Secondary | ICD-10-CM | POA: Insufficient documentation

## 2010-07-19 DIAGNOSIS — R55 Syncope and collapse: Secondary | ICD-10-CM | POA: Insufficient documentation

## 2010-07-24 ENCOUNTER — Ambulatory Visit
Admission: RE | Admit: 2010-07-24 | Discharge: 2010-07-24 | Payer: Self-pay | Source: Home / Self Care | Attending: Internal Medicine | Admitting: Internal Medicine

## 2010-07-24 DIAGNOSIS — S298XXA Other specified injuries of thorax, initial encounter: Secondary | ICD-10-CM | POA: Insufficient documentation

## 2010-07-25 ENCOUNTER — Telehealth: Payer: Self-pay | Admitting: Internal Medicine

## 2010-07-30 ENCOUNTER — Telehealth: Payer: Self-pay | Admitting: Internal Medicine

## 2010-08-12 NOTE — Assessment & Plan Note (Signed)
Summary: f/u on labs/swh   Vital Signs:  Patient profile:   77 year old female Weight:      184 pounds Temp:     98.6 degrees F oral Pulse rate:   78 / minute Resp:     17 per minute BP sitting:   150 / 82  (left arm)  Vitals Entered By: Rolla Flatten CMA (Nov 14, 2009 1:04 PM) CC: f/u labs, elevated BP, refills Comments REVIEWED MED LIST, PATIENT AGREED DOSE AND INSTRUCTION CORRECT    Primary Care Provider:  Unice Cobble  CC:  f/u labs, elevated BP, and refills.  History of Present Illness: She has multiple concerns; chief is pain in hips & below knees. Stiffness & pain in am which improves over 1 hour. Similar symptoms after sitting for prolonged period. Rx: Advil ? helps, she takes 3 pills two times a day, the second dose at bedtime .Dr Carloyn Manner has diagnosed Spinal Stenosis. She requested review of cx spine films done 09/19/2008: DDD present.Labs 11/08/2009 reviewed & risks discussed. Dr Bensimhon's 10/07/2009 OV reviewed ; CM & HTN stable.Dr Gardiner Ramus OV 03/23 reviewed: Granuloma Annulare. Triamcinolone  Rxed of minimal benefit.Dr Hollice Espy 03/16 also reviewed : creat 1.57, Spironolactone was D/Ced.Creat repeat was 1.2  on 04/29.  Allergies: 1)  ! Pcn 2)  ! Streptomycin 3)  ! Fosamax 4)  ! * Amlodipine 5)  ! Colchicine  Past History:  Past Medical History:  1. 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 Fuch's Dystrophy (Ophth), Dr Thayer Jew, [facility]       --dx'd on cath 2005. EF 29% with minimal distal CAD       --ech 10/09 EF: 55%  2. Hypertension, severe  3. thrombocytopenia(ITP), Dr Jamse Arn  4. granulare annulare  5. phebitis,PMH of   6. lower extremity edema  7. Osteoporosis  8. ? PMH  of ischemic colitis  9. Colonic polyps, hx of 10. Lumbar Stenosis, Dr Glenna Fellows, 2004 11. 1st degree AV block  Review of Systems ENT:  Complains of postnasal drainage. Resp:  Denies cough, shortness of breath, and sputum productive. GU:  Complains of incontinence; No better  with Detrol.Pad worn. MS:  Complains of joint pain, low back pain, and mid back pain; denies joint redness and joint swelling; Plantar fascitis . PMH of Osteoporosis. Derm:  Complains of rash; She had seen Dr Nevada Crane in past with same diagnosis.. Neuro:  Denies brief paralysis, numbness, tingling, and weakness.  Physical Exam  General:  Appears younger than age,in no acute distress; alert,appropriate and cooperative throughout examination Lungs:  Normal respiratory effort, chest expands symmetrically. Lungs are clear to auscultation except for slight bibasilar crackles or rales Heart:  normal rate, regular rhythm, no JVD, no HJR, and grade  1/6 systolic murmur.   Abdomen:  Bowel sounds positive,abdomen soft and non-tender without masses, organomegaly or hernias noted. Extremities:  No clubbing, cyanosis  noted .Normal ,full range of motion of all joints.   Crepitus R > L knee. Neg SLR.  Neurologic:   strength normal in all extremities but slight difficulty raising RLE to climb onto table.  Gait slightly broad Skin:  Scattered Granuloma Annulare lesions Cervical Nodes:  No lymphadenopathy noted Axillary Nodes:  No palpable lymphadenopathy Psych:  memory intact for recent and remote, normally interactive, and good eye contact.     Impression & Recommendations:  Problem # 1:  ARTHRALGIA (ICD-719.40)  Orders: Venipuncture [id number]) TLB-Sedimentation Rate (ESR) (85652-ESR) TLB-Rheumatoid Factor (RA) [id number]) Prescription Created  Electronically [phone])  Problem # 2:  RENAL INSUFFICIENCY (ICD-588.9) improved off Spironolactone  Problem # 3:  HYPERTENSION, BENIGN (ICD-401.1)  Her updated medication list for this problem includes:    Benazepril Hcl 20 Mg Tabs (Benazepril hcl) .Marland Kitchen... 1 by mouth two times a day    Furosemide 40 Mg Tabs (Furosemide) .Marland Kitchen... Take 1/2 to 1 tablet by mouth every day as needed    Carvedilol 6.25 Mg Tabs (Carvedilol) .Marland Kitchen... Take one tablet by mouth twice a  day  Problem # 4:  GRANULOMA ANNULARE (ICD-695.89)  Problem # 5:  OSTEOPOROSIS (ICD-733.00)  Orders: Venipuncture [id number]) T-Vitamin D (25-Hydroxy) [id number])  Complete Medication List: 1)  Benazepril Hcl 20 Mg Tabs (Benazepril hcl) .Marland Kitchen.. 1 by mouth two times a day 2)  Osteo Bi-flex Adv Joint Shield Tabs (Misc natural products) .... 2 tabs once daily 3)  Caltrate 600+d Plus 600-400 Mg-unit Tabs (Calcium carbonate-vit d-min) .... Take 2 tablets by mouth every day 4)  Ocuvite Tabs (Multiple vitamins-minerals) 5)  Omeprazole 20 Mg Cpdr (Omeprazole) .... One by mouth daily 6)  Furosemide 40 Mg Tabs (Furosemide) .... Take 1/2 to 1 tablet by mouth every day as needed 7)  Timoptic 0.25 % Soln (Timolol maleate) .Marland Kitchen.. 1 drop in right eye two times a day 8)  Brimonidine Tartrate 0.2 % Soln (Brimonidine tartrate) .Marland Kitchen.. 1 drop in right eye two times a day 9)  Gabapentin 100 Mg Caps (Gabapentin) .Marland Kitchen.. 1 q 8 hrs as needed leg pain 10)  Cyanocobalamin 1000 Mcg/ml Inj Soln (Cyanocobalamin) .... Inject 1 cc intramuscularly monthly 11)  Fish Oil Oil (Fish oil) .... 1,000mg  once daily 12)  Refresh Tears 0.5 % Soln (Carboxymethylcellulose sodium) .... 2 gtts each eye twice a day 13)  Carvedilol 6.25 Mg Tabs (Carvedilol) .... Take one tablet by mouth twice a day 14)  Triamcinolone Acetonide 0.5 % Oint (Triamcinolone acetonide) .... Apply once daily to affected area. give skin a break after 2 weeks of use. 15)  Tramadol Hcl 50 Mg Tabs (Tramadol hcl) .Marland Kitchen.. 1 every 6 hrs as needed for joint pain  Patient Instructions: 1)  Check your Blood Pressure regularly. If it is above:140/90 ON AVERAGE  you should INCREASE Carvedilol 6.25 mg to 1& 1/2 pills  two times a day . 2)  Limit your Sodium (Salt) to less than 4 grams a day (slightly less than 1 teaspoon) to prevent fluid retention, swelling, or worsening or symptoms. Prescriptions: TRAMADOL HCL 50 MG TABS (TRAMADOL HCL) 1 every 6 hrs as needed for joint pain  #30 x  2   Entered and Authorized by:   Unice Cobble MD   Signed by:   Unice Cobble MD on 11/14/2009   Method used:   Faxed to ...       [facility] [phone]* (retail)       [place], [place]  [address]       Ph: [phone] or [phone]       Fax: [phone]   RxID:   [id number]

## 2010-08-12 NOTE — Letter (Signed)
Summary: [facility]   Imported By: Edmonia James 10/11/2009 11:13:16  _____________________________________________________________________  External Attachment:    Type:   Image     Comment:   External Document

## 2010-08-12 NOTE — Assessment & Plan Note (Signed)
Summary: bp up/ref dr odogwu/cbs   Vital Signs:  Patient profile:   77 year old female Weight:      182.8 pounds BMI:     31.49 Temp:     98.7 degrees F oral Pulse rate:   60 / minute Resp:     17 per minute BP sitting:   126 / 80  (left arm) Cuff size:   large  Vitals Entered By: Georgette Dover CMA (March 13, 2010 10:40 AM) CC: Patient was at the cancer center yesterday and B/P was elevated. Patient checked B/P at home (arm cuff) 201/90 Comments Right Arm B/P: 122/88   Primary Care Provider:  Unice Cobble  CC:  Patient was at the cancer center yesterday and B/P was elevated. Patient checked B/P at home (arm cuff) 201/90.  History of Present Illness: Hypertension Follow-Up      This is an 77 year old woman who presents for Hypertension follow-up.  BP was 200+/ ? @ [place] for B12 for ITP. The patient reports urinary frequency  for weeks and fatigue, but denies lightheadedness and headaches.  Associated symptoms include dyspnea with any incline.  The patient denies the following associated symptoms: chest pain, chest pressure, exercise intolerance, palpitations, syncope, leg edema, and pedal edema.  Compliance with medications (by patient report) has been near 100%.  The patient reports that dietary compliance has been fair.  The patient reports no exercise due to hip OA.  Adjunctive measures currently used by the patient include modified  salt restriction.  BP LUE: faint 170/ strong 140/90; RUE: faint 180/strong 140/100.  Current Medications (verified): 1)  Benazepril Hcl 20 Mg Tabs (Benazepril Hcl) .Marland Kitchen.. 1 By Mouth Two Times A Day 2)  Osteo Bi-Flex Adv Joint Shield  Tabs (Misc Natural Products) .... 2 Tabs Once Daily 3)  Caltrate 600+d Plus 600-400 Mg-Unit  Tabs (Calcium Carbonate-Vit D-Min) .... Take 2 Tablets By Mouth Every Day 4)  Ocuvite   Tabs (Multiple Vitamins-Minerals) 5)  Omeprazole 20 Mg Cpdr (Omeprazole) .... One By Mouth Daily 6)  Furosemide 40 Mg  Tabs  (Furosemide) .... Take 1/2 To 1 Tablet By Mouth Every Day As Needed 7)  Timoptic 0.25 %  Soln (Timolol Maleate) .Marland Kitchen.. 1 Drop in Right Eye Two Times A Day 8)  Brimonidine Tartrate 0.2 %  Soln (Brimonidine Tartrate) .Marland Kitchen.. 1 Drop in Right Eye Two Times A Day 9)  Gabapentin 100 Mg Caps (Gabapentin) .Marland Kitchen.. 1 Q 8 Hrs As Needed Leg Pain 10)  Cyanocobalamin 1000 Mcg/ml Inj Soln (Cyanocobalamin) .... Inject 1 Cc Intramuscularly Monthly 11)  Fish Oil   Oil (Fish Oil) .... 1,000mg  Once Daily 12)  Refresh Tears 0.5 % Soln (Carboxymethylcellulose Sodium) .... 2 Gtts Each Eye Twice A Day 13)  Carvedilol 6.25 Mg Tabs (Carvedilol) .... Take One Tablet By Mouth Twice A Day 14)  Triamcinolone Acetonide 0.5 % Oint (Triamcinolone Acetonide) .... Apply Once Daily To Affected Area. Give Skin A Break After 2 Weeks of Use. 15)  Tramadol Hcl 50 Mg Tabs (Tramadol Hcl) .Marland Kitchen.. 1 Every 6 Hrs As Needed For Joint Pain  Allergies: 1)  ! Pcn 2)  ! Streptomycin 3)  ! Fosamax 4)  ! * Amlodipine 5)  ! Colchicine  Review of Systems General:  Denies chills, fever, and sweats. CV:  Denies bluish discoloration of lips or nails, difficulty breathing at night, difficulty breathing while lying down, and leg cramps with exertion. GU:  Complains of incontinence; denies discharge, dysuria, hematuria, and urinary  hesitancy; Nocturia > 1X, up to 3X occasionally.  Physical Exam  General:  Appears younger than age,in no acute distress; alert,appropriate and cooperative throughout examination Eyes:  No corneal or conjunctival inflammation noted. Asymmetry of pupils; OS> OD Funduscopic exam benign, without hemorrhages, exudates or papilledema. Lungs:  Normal respiratory effort, chest expands symmetrically. Lungs : fine crackles  @ bases. No increased WOB Heart:  regular rhythm, no murmur, no gallop, no rub, no JVD, and bradycardia.   Abdomen:  Bowel sounds positive,abdomen soft and non-tender without masses, organomegaly or hernias noted. No  AAA or bruits Pulses:  R and L carotid,radial,dorsalis pedis and posterior tibial pulses are full and equal bilaterally Extremities:  trace left pedal edema and trace right pedal edema.   Skin:  Granuloma annulare diffusely Psych:  memory intact for recent and remote, normally interactive, good eye contact, and not anxious appearing.     Impression & Recommendations:  Problem # 1:  HYPERTENSION (ICD-401.9) "Hardening of arteries" causes some pseudo elevation of BP(discussed) Her updated medication list for this problem includes:    Benazepril Hcl 20 Mg Tabs (Benazepril hcl) .Marland Kitchen... 1 by mouth two times a day    Furosemide 40 Mg Tabs (Furosemide) .Marland Kitchen... Take 1/2 to 1 tablet by mouth every day as needed    Carvedilol 6.25 Mg Tabs (Carvedilol) .Marland Kitchen... 2 pills two times a day  Complete Medication List: 1)  Benazepril Hcl 20 Mg Tabs (Benazepril hcl) .Marland Kitchen.. 1 by mouth two times a day 2)  Osteo Bi-flex Adv Joint Shield Tabs (Misc natural products) .... 2 tabs once daily 3)  Caltrate 600+d Plus 600-400 Mg-unit Tabs (Calcium carbonate-vit d-min) .... Take 2 tablets by mouth every day 4)  Ocuvite Tabs (Multiple vitamins-minerals) 5)  Omeprazole 20 Mg Cpdr (Omeprazole) .... One by mouth daily 6)  Furosemide 40 Mg Tabs (Furosemide) .... Take 1/2 to 1 tablet by mouth every day as needed 7)  Timoptic 0.25 % Soln (Timolol maleate) .Marland Kitchen.. 1 drop in right eye two times a day 8)  Brimonidine Tartrate 0.2 % Soln (Brimonidine tartrate) .Marland Kitchen.. 1 drop in right eye two times a day 9)  Gabapentin 100 Mg Caps (Gabapentin) .Marland Kitchen.. 1 q 8 hrs as needed leg pain 10)  Cyanocobalamin 1000 Mcg/ml Inj Soln (Cyanocobalamin) .... Inject 1 cc intramuscularly monthly 11)  Fish Oil Oil (Fish oil) .... 1,000mg  once daily 12)  Refresh Tears 0.5 % Soln (Carboxymethylcellulose sodium) .... 2 gtts each eye twice a day 13)  Carvedilol 6.25 Mg Tabs (Carvedilol) .... 2 pills two times a day 14)  Triamcinolone Acetonide 0.5 % Oint (Triamcinolone  acetonide) .... Apply once daily to affected area. give skin a break after 2 weeks of use. 15)  Tramadol Hcl 50 Mg Tabs (Tramadol hcl) .Marland Kitchen.. 1 every 6 hrs as needed for joint pain  Other Orders: Flu Vaccine 9yrs + [id number]) Administration Flu vaccine - MCR [id number]) Flu Vaccine Consent Questions     Do you have a history of severe allergic reactions to this vaccine? no    Any prior history of allergic reactions to egg and/or gelatin? no    Do you have a sensitivity to the preservative Thimersol? no    Do you have a past history of Guillan-Barre Syndrome? no    Do you currently have an acute febrile illness? no    Have you ever had a severe reaction to latex? no    Vaccine information given and explained to patient? yes    Are you currently pregnant?  no    Lot Number:[id number]   Exp Date:01/10/2011   Site Given  Right Deltoid IMers: Flu Vaccine 91yrs + [id number]) Administration Flu vaccine - MCR [id number])  Patient Instructions: 1)  Check your Blood Pressure regularly. If it is above:  140/90 ON AVERAGE you should make an appointment. 2)  Limit your Sodium (Salt) to less than 2 grams a day(slightly less than 1/2 a teaspoon) to prevent fluid retention, swelling, or worsening of symptoms. Marland Kitchenlbmedflu

## 2010-08-12 NOTE — Progress Notes (Signed)
Summary: Bx results.--pls. call back   Phone Note Outgoing Call   Summary of Call: Called pt and LMOM:  Bx shows granuloma annulare which confirms her dx. Typical treatment is topical steroid or doesn't have to use any treatment.  Per her chart I see no hx of sarcoidosis and I don't see any old CXRs on her EMR.  I let Dr. Linna Darner know about the bx results. Pt was given rx for steroid cream to use pern.  Initial call taken by: Beatrice Lecher MD,  October 03, 2009 2:07 PM  Follow-up for Phone Call        Pt. called back to ask what could have caused this?  Kelle Darting CMA  October 04, 2009 12:18 PM   Additional Follow-up for Phone Call Additional follow up Details #1::        There is no "cause" per se.  It is inflammation of the blood vessels.  WE don't know what "causes" it.  It is more common in women thought.  Additional Follow-up by: Beatrice Lecher MD,  October 04, 2009 12:34 PM    Additional Follow-up for Phone Call Additional follow up Details #2::    Left message on pt's voicemail advising her per Dr. Madilyn Fireman  that there is not a known cause but it is an inflammation of blood vessels. It seems to be more common in women. Kelle Darting CMA  October 04, 2009 2:35 PM

## 2010-08-12 NOTE — Progress Notes (Signed)
Summary: refill meds   Phone Note Refill Request Call back at Home Phone [phone] Message from:  Patient on Dec 10, 2009 9:13 AM  Refills Requested: Medication #1:  BENAZEPRIL HCL 20 MG TABS 1 by mouth two times a day  Medication #2:  CARVEDILOL 6.25 MG TABS Take one tablet by mouth twice a [facility]   Method Requested: Fax to [facility] Initial call taken by: Neil Crouch,  Dec 10, 2009 9:14 AM  Follow-up for Phone Call        benazepril rx sent 5/27, carvedilol sent today pt is aware Kevan Rosebush, RN  Dec 10, 2009 2:07 PM     Prescriptions: CARVEDILOL 6.25 MG TABS (CARVEDILOL) Take one tablet by mouth twice a day  #180 x 3   Entered by:   Kevan Rosebush, RN   Authorized by:   Jolaine Artist, MD, [facility]   Signed by:   Kevan Rosebush, RN on 12/10/2009   Method used:   Faxed to ...       [facility] (mail-order)             , [place]         Ph: [phone]       Fax: [phone]   RxID:   [id number]

## 2010-08-12 NOTE — Assessment & Plan Note (Signed)
Summary: Skin rash   Vital Signs:  Patient profile:   77 year old female Height:      64 inches Weight:      182 pounds Temp:     98.6 degrees F oral Pulse rate:   75 / minute BP sitting:   140 / 76  (left arm) Cuff size:   large  Vitals Entered By: Geanie Kenning (October 02, 2009 1:45 PM) CC: rash on arms and legs since 2007- no itching Is Patient Diabetic? No   Primary Care Provider:  Unice Cobble  CC:  rash on arms and legs since 2007- no itching.  History of Present Illness: Has had rash since 2007. Starts like a bump and then spreads into circles.  Just on the arms and legs. Occ itchy but mostly not.  Has used creams in the past, but not sure what.  Did have a bx about 2 years ago but was told it was normal. No alleviating or exacerbating factors.  Occ says it will look bright red but not sure what causes thi.   Habits & Providers  Alcohol-Tobacco-Diet     Alcohol drinks/day: 0     Tobacco Status: never  Exercise-Depression-Behavior     Does Patient Exercise: no     STD Risk: never     Drug Use: never     Seat Belt Use: always  Current Medications (verified): 1)  Benazepril Hcl 20 Mg Tabs (Benazepril Hcl) .Marland Kitchen.. 1 By Mouth Two Times A Day 2)  Osteo Bi-Flex Adv Joint Shield  Tabs (Misc Natural Products) .... 2 Tabs Once Daily 3)  Caltrate 600+d Plus 600-400 Mg-Unit  Tabs (Calcium Carbonate-Vit D-Min) .... Take 2 Tablets By Mouth Every Day 4)  Ocuvite   Tabs (Multiple Vitamins-Minerals) 5)  Omeprazole 20 Mg Cpdr (Omeprazole) .... One By Mouth Daily 6)  Imdur 30 Mg  Tb24 (Isosorbide Mononitrate) .... Take 1 Tablet By Mouth Once A Day 7)  Furosemide 40 Mg  Tabs (Furosemide) .... Take 1/2 To 1 Tablet By Mouth Every Day As Needed 8)  Timoptic 0.25 %  Soln (Timolol Maleate) .Marland Kitchen.. 1 Drop in Right Eye Two Times A Day 9)  Brimonidine Tartrate 0.2 %  Soln (Brimonidine Tartrate) .Marland Kitchen.. 1 Drop in Right Eye Two Times A Day 10)  Gabapentin 100 Mg Caps (Gabapentin) .Marland Kitchen.. 1 Q 8 Hrs As  Needed Leg Pain 11)  Cyanocobalamin 1000 Mcg/ml Inj Soln (Cyanocobalamin) .... Inject 1 Cc Intramuscularly Monthly 12)  Fish Oil   Oil (Fish Oil) .... 1,000mg  Once Daily 13)  Refresh Tears 0.5 % Soln (Carboxymethylcellulose Sodium) .... 2 Gtts Each Eye Twice A Day 14)  Carvedilol 6.25 Mg Tabs (Carvedilol) .... Take One Tablet By Mouth Twice A Day  Allergies (verified): 1)  ! Pcn 2)  ! Streptomycin 3)  ! Fosamax 4)  ! * Amlodipine 5)  ! Colchicine  Comments:  Nurse/Medical Assistant: The patient's medications and allergies were reviewed with the patient and were updated in the Medication and Allergy Lists. Geanie Kenning (October 02, 2009 1:47 PM)  Past History:  Past Medical History: Last updated: 07/11/2009  1. 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 Fuch's Dystrophy (Ophth), Dr Thayer Jew, [facility]       --dx'd on cath 2005. EF 29% with minimal distal CAD       --ech 10/09 EF: 55%  2. Hypertension, severe  3. thrombocytopenia  4. granulare annulare  5. phebitis  6. lower extremity edema  7.  Osteoporosis  8. ? of ischemic colitis  9. Colonic polyps, hx of 10. Lumbar Stenosis, Dr Glenna Fellows, 2004 11. 1st degree AV block  Past Surgical History: Last updated: 09/17/2008 Cataract extraction bilat. 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 (12/2003) LS Disc 4-5- no sx. 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 Skin is a little mre scaley in  these ares. Some ares are more round and macular but still red. They blanch easily.  Largest annular ring is about 8 cm.     Impression & Recommendations:  Problem # 1:  SKIN RASH (ICD-782.1)  Granuloma annulare vs nummular eczema vs tinea.  It blanches and has had a few of the lesions for several years so the last 2 are unlikely.  Will send bx today to see if consistant with G.A.  No hx of diabetes.  The only thing odd about G.A. is here age. Often seen in younger adults.  Will presume G.A and will treat wtih topical steroids. Explained there is no cure. Hx of low platelets s ocompression bandage applied. F/U wound care reviewed.    Her updated medication list for this problem includes:    Triamcinolone Acetonide 0.5 % Oint (Triamcinolone acetonide) .Marland Kitchen... Apply once daily to affected area. give skin a break after 2 weeks of use.  Orders: Biopsy (Punch) Skin, Single Lesion (11100)  Complete Medication List: 1)  Benazepril Hcl 20 Mg Tabs (Benazepril hcl) .Marland Kitchen.. 1 by mouth two times a day 2)  Osteo Bi-flex Adv Joint Shield Tabs (Misc natural products) .... 2 tabs once daily 3)  Caltrate 600+d Plus 600-400 Mg-unit Tabs (Calcium carbonate-vit d-min) .... Take 2 tablets by mouth every day 4)  Ocuvite Tabs (Multiple vitamins-minerals) 5)  Omeprazole 20 Mg Cpdr (Omeprazole) .... One by mouth daily 6)  Imdur 30 Mg Tb24 (Isosorbide mononitrate) .... Take 1 tablet by mouth once a day 7)  Furosemide 40 Mg Tabs (Furosemide) .... Take 1/2 to 1 tablet by mouth every day as needed 8)  Timoptic 0.25 % Soln (Timolol maleate) .Marland Kitchen.. 1 drop in right eye two times a day 9)  Brimonidine Tartrate 0.2 % Soln (Brimonidine tartrate) .Marland Kitchen.. 1 drop in right eye two times a day 10)  Gabapentin 100 Mg Caps (Gabapentin) .Marland Kitchen.. 1 q 8 hrs as needed leg pain 11)  Cyanocobalamin 1000 Mcg/ml Inj Soln (Cyanocobalamin) .... Inject 1 cc intramuscularly monthly 12)  Fish Oil Oil (Fish oil) .... 1,000mg  once daily 13)  Refresh Tears  0.5 % Soln (Carboxymethylcellulose sodium) .... 2 gtts each eye twice a day 14)  Carvedilol 6.25 Mg Tabs (Carvedilol) .... Take one tablet by mouth twice a day 15)  Triamcinolone Acetonide 0.5 % Oint (Triamcinolone acetonide) .... Apply once daily to affected area. give skin a break after 2 weeks of use.   Procedure Note  Biopsy:  Procedure # 1: punch biopsy - 24mm    Size (in cm): 1.2 x 1.0    Region: Right lateral leg below the knee.     Instrument used: 86mm punch    Anesthesia: 1% lidocaine w/epinephrine  Cleaned and prepped with: alcohol and betadine Wound dressing: bacitracin Additional Instructions: F/U wound care review.    Prescriptions: TRIAMCINOLONE ACETONIDE 0.5 % OINT (TRIAMCINOLONE ACETONIDE) Apply once daily to affected area. Give skin a break after 2 weeks of use.  #40 grams. x 4   Entered and Authorized by:   Beatrice Lecher MD   Signed by:   Beatrice Lecher MD on 10/02/2009   Method used:   Electronically to        [facility] [phone]* (retail)       [place], [place]  [address]       Ph: [phone] or [phone]       Fax: [phone]   RxID:   [id number]

## 2010-08-12 NOTE — Progress Notes (Signed)
Summary: Refill request  Phone Note Refill Request   Refills Requested: Medication #1:  SPIRONOLACTONE 25 MG TABS 1/2 by mouth once daily [facility]   Method Requested: Fax to [facility] Initial call taken by: Georgette Dover,  August 22, 2009 4:05 PM    Prescriptions: SPIRONOLACTONE 25 MG TABS (SPIRONOLACTONE) 1/2 by mouth once daily  #90 x 1   Entered by:   Georgette Dover   Authorized by:   Unice Cobble MD   Signed by:   Georgette Dover on 08/22/2009   Method used:   Faxed to ...       [facility]* ([place]ail)       [place]  [address]       Ph: [phone]       Fax: [phone]   RxID:   [id number]   Appended Document: Refill request Creatinine 1.57 on 09/25/2009 @ [facility]; please hold Spironolactone. RepeatBUN,creat , K+ , A1c in 6 weeks (790.29,585,401.9) & see me 2-3 days later  Appended Document: Refill request discussed with pt -- scheduled labs for 4/29 & ov with hopp 5/5

## 2010-08-12 NOTE — Letter (Signed)
Summary: [name]   Imported By: Edmonia James 04/29/2010 16:06:56  _____________________________________________________________________  External Attachment:    Type:   Image     Comment:   External Document

## 2010-08-12 NOTE — Letter (Signed)
Summary: [facility]  [facility]   Imported By: Edmonia James 05/27/2010 09:38:44  _____________________________________________________________________  External Attachment:    Type:   Image     Comment:   External Document

## 2010-08-12 NOTE — Assessment & Plan Note (Signed)
Summary: YEARLY/SL      Allergies Added:   Visit Type:  Follow-up Primary Provider:  Unice Cobble  CC:  no cardiac complaints. Pt went for second opinion about right arm but patient will be treated by Dr. Linna Darner.  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 EF has recovered and is now 55%. She also history of severe hypertension and lower extremity edema.  She returns today for routine followup overall she is doing fairly well. Denies any CP or SOB. Had some problems with sinus drainage which resolved with abx. BP has been labile but overall well controlled. No orthopnea, PND or significant edema.  Current Medications (verified): 1)  Benazepril Hcl 20 Mg Tabs (Benazepril Hcl) .Marland Kitchen.. 1 By Mouth Two Times A Day 2)  Osteo Bi-Flex Adv Joint Shield  Tabs (Misc Natural Products) .... 2 Tabs Once Daily 3)  Caltrate 600+d Plus 600-400 Mg-Unit  Tabs (Calcium Carbonate-Vit D-Min) .... Take 2 Tablets By Mouth Every Day 4)  Ocuvite   Tabs (Multiple Vitamins-Minerals) 5)  Omeprazole 20 Mg Cpdr (Omeprazole) .... One By Mouth Daily 6)  Imdur 30 Mg  Tb24 (Isosorbide Mononitrate) .... Take 1 Tablet By Mouth Once A Day 7)  Furosemide 40 Mg  Tabs (Furosemide) .... Take 1/2 To 1 Tablet By Mouth Every Day As Needed 8)  Timoptic 0.25 %  Soln (Timolol Maleate) .Marland Kitchen.. 1 Drop in Right Eye Two Times A Day 9)  Brimonidine Tartrate 0.2 %  Soln (Brimonidine Tartrate) .Marland Kitchen.. 1 Drop in Right Eye Two Times A Day 10)  Gabapentin 100 Mg Caps (Gabapentin) .Marland Kitchen.. 1 Q 8 Hrs As Needed Leg Pain 11)  Cyanocobalamin 1000 Mcg/ml Inj Soln (Cyanocobalamin) .... Inject 1 Cc Intramuscularly Monthly 12)  Fish Oil   Oil (Fish Oil) .... 1,000mg  Once Daily 13)  Refresh Tears 0.5 % Soln (Carboxymethylcellulose Sodium) .... 2 Gtts Each Eye Twice A Day 14)  Carvedilol 6.25 Mg Tabs (Carvedilol) .... Take One Tablet  By Mouth Twice A Day 15)  Triamcinolone Acetonide 0.5 % Oint (Triamcinolone Acetonide) .... Apply Once Daily To Affected Area. Give Skin A Break After 2 Weeks of Use.  Allergies (verified): 1)  ! Pcn 2)  ! Streptomycin 3)  ! Fosamax 4)  ! * Amlodipine 5)  ! Colchicine  Past History:  Past Medical History: Last updated: 07/11/2009  1. 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 Fuch's Dystrophy (Ophth), Dr Thayer Jew, [facility]       --dx'd on cath 2005. EF 29% with minimal distal CAD       --ech 10/09 EF: 55%  2. Hypertension, severe  3. thrombocytopenia  4. granulare annulare  5. phebitis  6. lower extremity edema  7. Osteoporosis  8. ? of ischemic colitis  9. Colonic polyps, hx of 10. Lumbar Stenosis, Dr Glenna Fellows, 2004 11. 1st degree AV block  Review of Systems       As per HPI and past medical history; otherwise all systems negative.   Vital Signs:  Patient profile:   77 year old female Height:      64 inches Weight:      182.4 pounds Pulse rate:   78 / minute BP sitting:   122 / 72  (left arm) Cuff size:   regular  Vitals Entered By: Lubertha Basque, CNA (October 07, 2009 11:56 AM)  Physical Exam  General:  Elderly. No acure distress. HEENT: normal Neck: supple. no JVD. Carotids 2+ bilat; no bruits. No lymphadenopathy or thryomegaly appreciated. Cor: PMI nondisplaced. Regular rate & rhythm. No rubs, gallops, murmur. Lungs: clear Abdomen: soft, nontender, nondistended. No hepatosplenomegaly. No bruits or masses. Good bowel sounds. Extremities: no cyanosis, clubbing, rash, edema Neuro: alert & orientedx3, cranial nerves grossly intact. moves all 4 extremities w/o difficulty. affect pleasant     Impression & Recommendations:  Problem # 1:  CARDIOMYOPATHY, PRIMARY, DILATED (ICD-425.4) Doing well. EF recovered. No further work-up at this time. Can stop Imdur.  Problem # 2:  HYPERTENSION, BENIGN (ICD-401.1) Blood pressure well controlled. Continue current  regimen.  Patient Instructions: 1)  Your physician recommends that you schedule a follow-up appointment in: 12 months with Dr Haroldine Laws 2)  Your physician has recommended you make the following change in your medication: stop Imdur

## 2010-08-14 NOTE — Assessment & Plan Note (Signed)
Summary: [facility]//lch   Vital Signs:  Patient profile:   77 year old female Weight:      184.2 pounds BMI:     31.73 Temp:     98.6 degrees F oral Pulse rate:   84 / minute Resp:     16 per minute BP sitting:   136 / 90  (left arm) Cuff size:   large  Vitals Entered By: Georgette Dover CMA (July 24, 2010 1:45 PM) CC: Follow-up visit: patient seent at Sat clinic for back pain, patient no better and unable to lay down to sleep due to pain    Primary Care Provider:  Unice Cobble  CC:  Follow-up visit: patient seent at [facility] for back pain and patient no better and unable to lay down to sleep due to pain .  History of Present Illness:      This is an 77 year old female who presents with Chest pain since she fell 01/05 landing on R back  The patient denies resting chest pain, nausea, vomiting, diaphoresis, shortness of breath, and palpitations.  The pain is described as constant.  The pain is located in the right inferopostero  chest.  The pain radiates to the back.  The pain is brought on or made worse by any activity, coughing, deep breathing, and upper body movement.  Rx: NSAIDS "4 @ a time"  Current Medications (verified): 1)  Benazepril Hcl 20 Mg Tabs (Benazepril Hcl) .Marland Kitchen.. 1 By Mouth Two Times A Day 2)  Osteo Bi-Flex Adv Joint Shield  Tabs (Misc Natural Products) .... 2 Tabs Once Daily 3)  Caltrate 600+d Plus 600-400 Mg-Unit  Tabs (Calcium Carbonate-Vit D-Min) .... Take 2 Tablets By Mouth Every Day 4)  Ocuvite   Tabs (Multiple Vitamins-Minerals) 5)  Omeprazole 20 Mg Cpdr (Omeprazole) .... One By Mouth Daily 6)  Furosemide 40 Mg  Tabs (Furosemide) .... Take 1/2 To 1 Tablet By Mouth Every Day As Needed 7)  Timoptic 0.25 %  Soln (Timolol Maleate) .Marland Kitchen.. 1 Drop in Right Eye Two Times A Day 8)  Brimonidine Tartrate 0.2 %  Soln (Brimonidine Tartrate) .Marland Kitchen.. 1 Drop in Right Eye Two Times A Day 9)  Gabapentin 100 Mg Caps (Gabapentin) .Marland Kitchen.. 1 Q 8 Hrs As Needed Leg  Pain 10)  Cyanocobalamin 1000 Mcg/ml Inj Soln (Cyanocobalamin) .... Inject 1 Cc Intramuscularly Monthly 11)  Fish Oil   Oil (Fish Oil) .... 1,000mg  Once Daily 12)  Refresh Tears 0.5 % Soln (Carboxymethylcellulose Sodium) .... 2 Gtts Each Eye Twice A Day 13)  Carvedilol 6.25 Mg Tabs (Carvedilol) .... 2 Pills Two Times A Day 14)  Tramadol Hcl 50 Mg Tabs (Tramadol Hcl) .Marland Kitchen.. 1 Every 6 Hrs As Needed For Joint Pain 15)  Fluticasone Propionate 50 Mcg/act Susp (Fluticasone Propionate) .Marland Kitchen.. 1 Spray Two Times A Day 16)  Hydroxychloroquine Sulfate 200 Mg Tabs (Hydroxychloroquine Sulfate) .Marland Kitchen.. 1-2 X Daily  Allergies: 1)  ! Pcn 2)  ! Streptomycin 3)  ! Fosamax 4)  ! * Amlodipine 5)  ! Colchicine  Physical Exam  General:  in no acute distress; alert,appropriate and cooperative throughout examination Chest Wall:  R posterior rib cage  tenderness to compression & percussion.   Lungs:  Normal respiratory effort, chest expands symmetrically. Lungs : mild rales w/o increased WOB; ? faint rub R iferopost chest wall Heart:  S4 gallop and grade 1 /6 systolic murmur.     Impression & Recommendations:  Problem # 1:  OTHER INJURY OF  CHEST WALL (ICD-959.11)  R/O fractures  Orders: T-Ribs Unilateral 2 Views (71100TC) Prescription Created Electronically [phone])  Problem # 2:  BACK PAIN (ICD-724.5) Assessment: Unchanged  Her updated medication list for this problem includes:    Tramadol Hcl 50 Mg Tabs (Tramadol hcl) .Marland Kitchen... 1 every 6 hrs as needed for joint pain    Meloxicam 7.5 Mg Tabs (Meloxicam) .Marland Kitchen... 1 two times a day as needed chest wall pain  Orders: T-Thoracic Spine 2 Views [phone])  Complete Medication List: 1)  Benazepril Hcl 20 Mg Tabs (Benazepril hcl) .Marland Kitchen.. 1 by mouth two times a day 2)  Osteo Bi-flex Adv Joint Shield Tabs (Misc natural products) .... 2 tabs once daily 3)  Caltrate 600+d Plus 600-400 Mg-unit Tabs (Calcium carbonate-vit d-min) .... Take 2 tablets by mouth every day 4)   Ocuvite Tabs (Multiple vitamins-minerals) 5)  Omeprazole 20 Mg Cpdr (Omeprazole) .... One by mouth daily 6)  Furosemide 40 Mg Tabs (Furosemide) .... Take 1/2 to 1 tablet by mouth every day as needed 7)  Timoptic 0.25 % Soln (Timolol maleate) .Marland Kitchen.. 1 drop in right eye two times a day 8)  Brimonidine Tartrate 0.2 % Soln (Brimonidine tartrate) .Marland Kitchen.. 1 drop in right eye two times a day 9)  Gabapentin 100 Mg Caps (Gabapentin) .Marland Kitchen.. 1 q 8 hrs as needed leg pain 10)  Cyanocobalamin 1000 Mcg/ml Inj Soln (Cyanocobalamin) .... Inject 1 cc intramuscularly monthly 11)  Fish Oil Oil (Fish oil) .... 1,000mg  once daily 12)  Refresh Tears 0.5 % Soln (Carboxymethylcellulose sodium) .... 2 gtts each eye twice a day 13)  Carvedilol 6.25 Mg Tabs (Carvedilol) .... 2 pills two times a day 14)  Tramadol Hcl 50 Mg Tabs (Tramadol hcl) .Marland Kitchen.. 1 every 6 hrs as needed for joint pain 15)  Fluticasone Propionate 50 Mcg/act Susp (Fluticasone propionate) .Marland Kitchen.. 1 spray two times a day 16)  Hydroxychloroquine Sulfate 200 Mg Tabs (Hydroxychloroquine sulfate) .Marland Kitchen.. 1-2 x daily 17)  Meloxicam 7.5 Mg Tabs (Meloxicam) .Marland Kitchen.. 1 two times a day as needed chest wall pain  Patient Instructions: 1)  Report shortness of breath , fever or increasing pain Prescriptions: MELOXICAM 7.5 MG TABS (MELOXICAM) 1 two times a day as needed chest wall pain  #20 x 0   Entered and Authorized by:   Unice Cobble MD   Signed by:   Unice Cobble MD on 07/24/2010   Method used:   Electronically to        [facility] [phone]* (retail)       [place], [place]  [address]       Ph: [phone] or [phone]       Fax: [phone]   RxID:   [id number]    Orders Added: 1)  Est. Patient Level III [id number] 2)  T-Ribs Unilateral 2 Views [71100TC] 3)  T-Thoracic Spine 2 Views [72070TC] 4)  Prescription Created Electronically [id number]

## 2010-08-14 NOTE — Letter (Signed)
Summary: [facility]  [facility]   Imported By: Edmonia James 07/22/2010 14:25:15  _____________________________________________________________________  External Attachment:    Type:   Image     Comment:   External Document

## 2010-08-14 NOTE — Assessment & Plan Note (Signed)
Summary: back pain after fall//[facility] pt//lch   Vital Signs:  Patient profile:   77 year old female Height:      64 inches Weight:      180 pounds BMI:     31.01 Pulse rate:   67 / minute Pulse rhythm:   regular BP sitting:   144 / 82  (left arm) Cuff size:   large  Vitals Entered By: Sherlean Foot CMA (July 19, 2010 10:14 AM) CC: Mid back pain and slight HA after falling against wall on Thurs.    Primary Care Provider:  Unice Cobble  CC:  Mid back pain and slight HA after falling against wall on Thurs. .  History of Present Illness: 77 y/o fem. ns who comes in for eval. of syncopal episode last thursday @ home.  episode obs. by her Vet. who was making a house call on her cat!!!!!!!!!!!!!!!  pt. bent over stoodup and fell backwards and hit her back.  no pain at the time..no loc.  next day...back hurt..............call [facility] and advised to go to  sat clinic for eval  Allergies: 1)  ! Pcn 2)  ! Streptomycin 3)  ! Fosamax 4)  ! * Amlodipine 5)  ! Colchicine  Past History:  Past medical, surgical, family and social histories (including risk factors) reviewed for relevance to current acute and chronic problems.  Past Medical History: Reviewed history from 11/14/2009 and no changes required.  1. 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 Fuch's Dystrophy (Ophth), Dr Thayer Jew, [facility]       --dx'd on cath 2005. EF 29% with minimal distal CAD       --ech 10/09 EF: 55%  2. Hypertension, severe  3. thrombocytopenia(ITP), Dr Jamse Arn  4. granulare annulare  5. phebitis,PMH of   6. lower extremity edema  7. Osteoporosis  8. ? PMH  of ischemic colitis  9. Colonic polyps, hx of 10. Lumbar Stenosis, Dr Glenna Fellows, 2004 11. 1st degree AV block  Past Surgical History: Reviewed history from 09/17/2008 and no changes required. Cataract extraction bilat. 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  (12/2003) LS Disc 4-5- no sx. (10/2004) 3 Cornea transplants Colonoscopy- adenoma (09/2005) Rectal bleed, Colonoscopy- ischemic colitis (05-01-06) Appendectomy  Family History: Reviewed history from 09/17/2008 and no changes required. Mother died of complications from CHF; Osteoporosis w/ fractures, CAD (died @91 )   Father died of complications from colon cancer.  She has 2 sisters, one who is deceased from brain aneurysm, the second is deceased from cancer and CHF.  Maternal aunts 2 or 3:  Colon CA  Social History: Reviewed history from 09/17/2008 and no changes required. Never Smoked Retired Widowed  Alcohol Use - no  Review of Systems      See HPI  Physical Exam  General:  Well-developed,well-nourished,in no acute distress; alert,appropriate and cooperative throughout examination Head:  Normocephalic and atraumatic without obvious abnormalities. No apparent alopecia or balding. Eyes:  No corneal or conjunctival inflammation noted. EOMI. Perrla. Funduscopic exam benign, without hemorrhages, exudates or papilledema. Vision grossly normal. Ears:  External ear exam shows no significant lesions or deformities.  Otoscopic examination reveals clear canals, tympanic membranes are intact bilaterally without bulging, retraction, inflammation or discharge. Hearing is grossly normal bilaterally. Nose:  External nasal examination shows no deformity or inflammation. Nasal mucosa are pink and moist without lesions or exudates. Mouth:  Oral mucosa and oropharynx without lesions or exudates.  Teeth in good repair. Neck:  No deformities, masses, or tenderness noted.............no car. bruits Lungs:  Normal respiratory effort, chest expands symmetrically. Lungs are clear to auscultation, no crackles or wheezes. Heart:  Normal rate and regular rhythm. S1 and S2 normal without gallop, murmur, click, rub or other extra sounds. Msk:  No deformity or scoliosis noted of thoracic or lumbar spine.      Problems:  Medical Problems Added: 1)  Dx of Back Pain  (ICD-724.5) 2)  Dx of Syncope  (ICD-780.2)  Impression & Recommendations:  Problem # 1:  BACK PAIN (ICD-724.5) Assessment New  Her updated medication list for this problem includes:    Tramadol Hcl 50 Mg Tabs (Tramadol hcl) .Marland Kitchen... 1 every 6 hrs as needed for joint pain  Problem # 2:  SYNCOPE (ICD-780.2) Assessment: New  Complete Medication List: 1)  Benazepril Hcl 20 Mg Tabs (Benazepril hcl) .Marland Kitchen.. 1 by mouth two times a day 2)  Osteo Bi-flex Adv Joint Shield Tabs (Misc natural products) .... 2 tabs once daily 3)  Caltrate 600+d Plus 600-400 Mg-unit Tabs (Calcium carbonate-vit d-min) .... Take 2 tablets by mouth every day 4)  Ocuvite Tabs (Multiple vitamins-minerals) 5)  Omeprazole 20 Mg Cpdr (Omeprazole) .... One by mouth daily 6)  Furosemide 40 Mg Tabs (Furosemide) .... Take 1/2 to 1 tablet by mouth every day as needed 7)  Timoptic 0.25 % Soln (Timolol maleate) .Marland Kitchen.. 1 drop in right eye two times a day 8)  Brimonidine Tartrate 0.2 % Soln (Brimonidine tartrate) .Marland Kitchen.. 1 drop in right eye two times a day 9)  Gabapentin 100 Mg Caps (Gabapentin) .Marland Kitchen.. 1 q 8 hrs as needed leg pain 10)  Cyanocobalamin 1000 Mcg/ml Inj Soln (Cyanocobalamin) .... Inject 1 cc intramuscularly monthly 11)  Fish Oil Oil (Fish oil) .... 1,000mg  once daily 12)  Refresh Tears 0.5 % Soln (Carboxymethylcellulose sodium) .... 2 gtts each eye twice a day 13)  Carvedilol 6.25 Mg Tabs (Carvedilol) .... 2 pills two times a day 14)  Triamcinolone Acetonide 0.5 % Oint (Triamcinolone acetonide) .... Apply once daily to affected area. give skin a break after 2 weeks of use. 15)  Tramadol Hcl 50 Mg Tabs (Tramadol hcl) .Marland Kitchen.. 1 every 6 hrs as needed for joint pain 16)  Fluticasone Propionate 50 Mcg/act Susp (Fluticasone propionate) .Marland Kitchen.. 1 spray two times a day  Patient Instructions: 1)  dec. your coreg to 1tab............ 2 x day 2)  bp check 3 x day at home  3)  see  Dr. Linna Darner next week....................   Orders Added: 1)  Est. Patient Level IV [id number]

## 2010-08-14 NOTE — Progress Notes (Signed)
Summary: Radiology Report results  Phone Note Outgoing Call Call back at Home Phone [phone]   Call placed by: Georgette Dover CMA,  July 25, 2010 9:06 AM Call placed to: Patient Summary of Call: Spoke with patient:  1.) You certainly should be hurting; multiple rib fractures are present. These will take weeks to heal. A Fentanyl patch may help control your pain. (see Rx). [facility]  2.) Thoracic Spine: prior treatment of 12th thoracic vertebra; no new fracture seen prior treatment of 12th thoracic vertebra; no new fracture seen  Patient then stated she forgot to mention that Dr.Todd changed her B/P med Carvediol from 2 two times a day to 1 two times a day and patient questions if she should continue med that way. Either way patient needs a rx that she will pick-up to send to [facility] [place]  July 25, 2010 9:11 AM    Follow-up for Phone Call         Change to 25 mg two times a day; monitor BP . Goal = average < 135/85. Call if < 110/60 Follow-up by: Unice Cobble MD,  July 25, 2010 11:22 AM  Additional Follow-up for Phone Call Additional follow up Details #1::        Left message on machine to call back to office. Ernestene Mention CMA  July 25, 2010 3:07 PM   Patient aware. Additional Follow-up by: Ernestene Mention CMA,  July 28, 2010 11:30 AM    New/Updated Medications: CARVEDILOL 25 MG TABS (CARVEDILOL) 1 by mouth two times a day Prescriptions: CARVEDILOL 25 MG TABS (CARVEDILOL) 1 by mouth two times a day  #180 x 1   Entered by:   [place]illiam   Authorized by:   Unice Cobble MD   Signed by:   Georgette Dover CMA on 07/25/2010   Method used:   Print then Give to Patient   RxID:   [id number]

## 2010-08-14 NOTE — Progress Notes (Signed)
Summary: Feet swelling  Phone Note Call from Patient Call back at Home Phone [phone]   Summary of Call: Patient called the office noting that she began Meloxicam last Thurs and she is having trouble with her feet swelling. Patient would like to know if it is possibly due to the Meloxicam. Please advise. Initial call taken by: Ernestene Mention CMA,  July 30, 2010 10:38 AM  Follow-up for Phone Call        take Furosemide 40 mg once daily until off Meloxicam, stop Meloxicam if no better with diuretic Follow-up by: Unice Cobble MD,  July 30, 2010 12:53 PM  Additional Follow-up for Phone Call Additional follow up Details #1::        Left message on machine to call back to office. Ernestene Mention CMA  July 30, 2010 2:16 PM   Patient notified. Ernestene Mention CMA  July 30, 2010 4:02 PM

## 2010-08-18 ENCOUNTER — Telehealth (INDEPENDENT_AMBULATORY_CARE_PROVIDER_SITE_OTHER): Payer: Self-pay | Admitting: *Deleted

## 2010-08-27 ENCOUNTER — Encounter (HOSPITAL_BASED_OUTPATIENT_CLINIC_OR_DEPARTMENT_OTHER): Payer: Medicare Other | Admitting: Hematology and Oncology

## 2010-08-27 DIAGNOSIS — E538 Deficiency of other specified B group vitamins: Secondary | ICD-10-CM

## 2010-08-28 NOTE — Progress Notes (Signed)
Summary: Med concerns  Phone Note Refill Request Call back at Home Phone [phone] Message from:  Patient on August 18, 2010 11:28 AM  Refills Requested: Medication #1:  CARVEDILOL 25 MG TABS 1 by mouth two times a day patient said she received rx from mail order for 25 mg- she said she takes 6.25 mg    Follow-up for Phone Call        Dr.Hopper I do see where patient was changed back to Carvediol 6.25, 2 po bid, but it was not changed on the med list so when patient called for refill med was selected from current med list and she now has 25mg  tab.   Question if patient can take 1/2 of the 25mg  tab daily in place of the 6.25 2,  two times a day, please advise Follow-up by: [place],  August 19, 2010 11:43 AM  Additional Follow-up for Phone Call Additional follow up Details #1::        1/2 two times a day will be same dose; please change in EMR Additional Follow-up by: Unice Cobble MD,  August 19, 2010 12:38 PM    Additional Follow-up for Phone Call Additional follow up Details #2::    Left message on voicemail informing patient ok to take 1/2 two times a day of 25mg  tab. Noted for patient to call if any additional questions or concerns .Georgette Dover CMA  August 19, 2010 2:43 PM   New/Updated Medications: CARVEDILOL 25 MG TABS (CARVEDILOL) 1/2 by mouth two times a day

## 2010-09-15 ENCOUNTER — Ambulatory Visit (INDEPENDENT_AMBULATORY_CARE_PROVIDER_SITE_OTHER)
Admission: RE | Admit: 2010-09-15 | Discharge: 2010-09-15 | Disposition: A | Discharge status: Home / Self Care | Payer: Medicare Other | Source: Ambulatory Visit | Attending: Internal Medicine | Admitting: Internal Medicine

## 2010-09-15 ENCOUNTER — Encounter: Payer: Self-pay | Admitting: Internal Medicine

## 2010-09-15 ENCOUNTER — Ambulatory Visit (INDEPENDENT_AMBULATORY_CARE_PROVIDER_SITE_OTHER): Payer: Medicare Other | Admitting: Internal Medicine

## 2010-09-15 ENCOUNTER — Other Ambulatory Visit: Payer: Self-pay | Admitting: Internal Medicine

## 2010-09-15 DIAGNOSIS — R071 Chest pain on breathing: Secondary | ICD-10-CM

## 2010-09-16 ENCOUNTER — Encounter: Payer: Self-pay | Admitting: Internal Medicine

## 2010-09-16 ENCOUNTER — Other Ambulatory Visit: Payer: Self-pay | Admitting: Internal Medicine

## 2010-09-16 ENCOUNTER — Telehealth (INDEPENDENT_AMBULATORY_CARE_PROVIDER_SITE_OTHER): Payer: Self-pay | Admitting: *Deleted

## 2010-09-16 ENCOUNTER — Ambulatory Visit: Payer: Self-pay | Admitting: Internal Medicine

## 2010-09-16 ENCOUNTER — Ambulatory Visit (INDEPENDENT_AMBULATORY_CARE_PROVIDER_SITE_OTHER)
Admission: RE | Admit: 2010-09-16 | Discharge: 2010-09-16 | Disposition: A | Discharge status: Home / Self Care | Payer: Medicare Other | Source: Ambulatory Visit | Attending: Internal Medicine | Admitting: Internal Medicine

## 2010-09-16 ENCOUNTER — Other Ambulatory Visit (INDEPENDENT_AMBULATORY_CARE_PROVIDER_SITE_OTHER): Payer: Medicare Other

## 2010-09-16 DIAGNOSIS — R0789 Other chest pain: Secondary | ICD-10-CM

## 2010-09-16 DIAGNOSIS — Z01818 Encounter for other preprocedural examination: Secondary | ICD-10-CM

## 2010-09-16 DIAGNOSIS — R071 Chest pain on breathing: Secondary | ICD-10-CM

## 2010-09-16 LAB — BUN: BUN: 25 mg/dL — ABNORMAL HIGH (ref 6–23)

## 2010-09-16 MED ORDER — IOHEXOL 300 MG/ML  SOLN
50.0000 mL | Freq: Once | INTRAMUSCULAR | Status: AC | PRN
  Administered 2010-09-16: 50 mL via INTRAVENOUS

## 2010-09-18 ENCOUNTER — Encounter: Payer: Self-pay | Admitting: Internal Medicine

## 2010-09-23 NOTE — Progress Notes (Signed)
Summary: call-a-nurse  Phone Note Outgoing Call   Details for Reason: [facility] Triage Call Report Triage Record Num: [id number] Operator: Flonnie Hailstone Patient Name: Hurshel Keys Call Date & Time: 09/15/2010 9:25:29PM Patient Phone: [phone] PCP: Unice Cobble Patient Gender: Female PCP Fax : [phone] Patient DOB: [birth year] Practice Name: [facility] Reason for Call: [facility] calling and states pt. had d dimer drawn 3-5 and is 0.78/high. MD not notified. Protocol(s) Used: Office Note Recommended Outcome per Protocol: Information Noted and Sent to Office Reason for Outcome: Caller information to office Care Advice:  ~ 03/  Follow-up for Phone Call        will forward to Dr. Linna Darner to address asap.Marland KitchenMarland KitchenMarland KitchenMardee Munnerlyn CMA  September 16, 2010 10:30 AM   spoke w/ [facility] per instructions have patient come in today for bun,creat and CT angio patient aware of instructions and will be in today for labs ...Marland KitchenMarland KitchenHutton Vano CMA  September 16, 2010 10:35 AM      Appended Document: call-a-nurse     Clinical Lists Changes  Orders: Added new Referral order of Radiology Referral (Radiology) - Signed

## 2010-09-23 NOTE — Assessment & Plan Note (Signed)
Summary: RT SIDE PAIN IN RIBS/RH.....   Vital Signs:  Patient profile:   77 year old female Weight:      184 pounds BMI:     31.70 O2 Sat:      98 % on Room air Temp:     98.5 degrees F oral Pulse rate:   64 / minute Resp:     15 per minute BP sitting:   126 / 78  (left arm) Cuff size:   large  Vitals Entered By: Georgette Dover CMA (September 15, 2010 2:57 PM)  O2 Flow:  Room air CC: Right side rib pain. Onset: Last night , Heartburn   Primary Care Provider:  Unice Cobble  CC:  Right side rib pain. Onset: Last night  and Heartburn.  History of Present Illness:    Onset early this am as R sided chest pain after awakening to go to BR about 4:30 am. Some soreness in area 03/04 pre bedtime. She now reports resting chest pain and nausea, but denies vomiting, diaphoresis, shortness of breath, palpitations, dizziness, light headedness, syncope, and indigestion.  The pain is described as constant and sharp & stabbing..  The pain radiates to the back to scapula.  Episodes of chest pain last >30 minutes.  The pain is brought on or made worse by deep breathing and upper body movement.  The pain is relieved or improved with NSAIDs. The patient denies acid reflux, sour taste in mouth, epigastric pain, and trouble swallowing.  The patient denies the following alarm features: melena, dysphagia, and hematemesis. PMH of fractured ribs ; "this is worse". PMH of phlebitis w/o DVT or PTE.  Current Medications (verified): 1)  Benazepril Hcl 20 Mg Tabs (Benazepril Hcl) .Marland Kitchen.. 1 By Mouth Two Times A Day 2)  Osteo Bi-Flex Adv Joint Shield  Tabs (Misc Natural Products) .... 2 Tabs Once Daily 3)  Caltrate 600+d Plus 600-400 Mg-Unit  Tabs (Calcium Carbonate-Vit D-Min) .... Take 2 Tablets By Mouth Every Day 4)  Ocuvite   Tabs (Multiple Vitamins-Minerals) 5)  Omeprazole 20 Mg Cpdr (Omeprazole) .... One By Mouth Daily 6)  Furosemide 40 Mg  Tabs (Furosemide) .... Take 1/2 To 1 Tablet By Mouth Every Day As Needed 7)   Timoptic 0.25 %  Soln (Timolol Maleate) .Marland Kitchen.. 1 Drop in Right Eye Two Times A Day 8)  Brimonidine Tartrate 0.2 %  Soln (Brimonidine Tartrate) .Marland Kitchen.. 1 Drop in Right Eye Two Times A Day 9)  Gabapentin 100 Mg Caps (Gabapentin) .Marland Kitchen.. 1 Q 8 Hrs As Needed Leg Pain 10)  Cyanocobalamin 1000 Mcg/ml Inj Soln (Cyanocobalamin) .... Inject 1 Cc Intramuscularly Monthly 11)  Fish Oil   Oil (Fish Oil) .... 1,000mg  Once Daily 12)  Refresh Tears 0.5 % Soln (Carboxymethylcellulose Sodium) .... 2 Gtts Each Eye Twice A Day 13)  Carvedilol 25 Mg Tabs (Carvedilol) .... 1/2 By Mouth Two Times A Day 14)  Tramadol Hcl 50 Mg Tabs (Tramadol Hcl) .Marland Kitchen.. 1 Every 6 Hrs As Needed For Joint Pain 15)  Fluticasone Propionate 50 Mcg/act Susp (Fluticasone Propionate) .Marland Kitchen.. 1 Spray Two Times A Day 16)  Hydroxychloroquine Sulfate 200 Mg Tabs (Hydroxychloroquine Sulfate) .Marland Kitchen.. 1-2 X Daily 17)  Meloxicam 7.5 Mg Tabs (Meloxicam) .Marland Kitchen.. 1 Two Times A Day As Needed Chest Wall Pain 18)  Fentanyl 12 Mcg/hr Pt72 (Fentanyl) .Marland Kitchen.. 1-2  Patch Every 3 Days  Allergies: 1)  ! Pcn 2)  ! Streptomycin 3)  ! Fosamax 4)  ! * Amlodipine 5)  ! Colchicine  Physical Exam  General:  Uncomfortable but in no acute distress; alert,appropriate and cooperative throughout examination Eyes:  No corneal or conjunctival inflammation noted.No icterus Mouth:  Oral mucosa and oropharynx without lesions or exudates.  No pharyngeal erythema.   Chest Wall:  chest wall  tenderness R inf rib cage > L   Lungs:  Normal respiratory effort, chest expands symmetrically. Lungs: RLL crackles > LLL; nowheezes. Heart:  normal rate, regular rhythm, no gallop, no rub, no JVD, no HJR, and grade1  /6 systolic murmur.   Abdomen:  Bowel sounds positive,abdomen soft and non-tender without masses, organomegaly or hernias noted. Pulses:  R and L carotid,radial,dorsalis pedis and posterior tibial pulses are full and equal bilaterally Extremities:  1/2 + left  & R pedal edema.  Neg oman's    Neurologic:  alert & oriented X3.   Skin:  granulare annulare lsions Cervical Nodes:  No lymphadenopathy noted Axillary Nodes:  No palpable lymphadenopathy Psych:  memory intact for recent and remote, normally interactive, and good eye contact.     Impression & Recommendations:  Problem # 1:  CHEST WALL PAIN, ACUTE (ICD-786.52)  R/O fracture;doubt PTE Her updated medication list for this problem includes:    Meloxicam 7.5 Mg Tabs (Meloxicam) .Marland Kitchen... 1 two times a day as needed chest wall pain    Meloxicam 7.5 Mg Tabs (Meloxicam) .Marland Kitchen... 1 two times a day as needed for pain  Orders: Venipuncture [id number]) T-D-Dimer Fibrin Derivatives Quantitive [id number]) T-2 View CXR (71020TC) T-Ribs Unilateral 2 Views (71100TC) Prescription Created Electronically [id number])  Complete Medication List: 1)  Benazepril Hcl 20 Mg Tabs (Benazepril hcl) .Marland Kitchen.. 1 by mouth two times a day 2)  Osteo Bi-flex Adv Joint Shield Tabs (Misc natural products) .... 2 tabs once daily 3)  Caltrate 600+d Plus 600-400 Mg-unit Tabs (Calcium carbonate-vit d-min) .... Take 2 tablets by mouth every day 4)  Ocuvite Tabs (Multiple vitamins-minerals) 5)  Omeprazole 20 Mg Cpdr (Omeprazole) .... One by mouth daily 6)  Furosemide 40 Mg Tabs (Furosemide) .... Take 1/2 to 1 tablet by mouth every day as needed 7)  Timoptic 0.25 % Soln (Timolol maleate) .Marland Kitchen.. 1 drop in right eye two times a day 8)  Brimonidine Tartrate 0.2 % Soln (Brimonidine tartrate) .Marland Kitchen.. 1 drop in right eye two times a day 9)  Gabapentin 100 Mg Caps (Gabapentin) .Marland Kitchen.. 1 q 8 hrs as needed leg pain 10)  Cyanocobalamin 1000 Mcg/ml Inj Soln (Cyanocobalamin) .... Inject 1 cc intramuscularly monthly 11)  Fish Oil Oil (Fish oil) .... 1,000mg  once daily 12)  Refresh Tears 0.5 % Soln (Carboxymethylcellulose sodium) .... 2 gtts each eye twice a day 13)  Carvedilol 25 Mg Tabs (Carvedilol) .... 1/2 by mouth two times a day 14)  Tramadol Hcl 50 Mg Tabs (Tramadol hcl) .Marland Kitchen.. 1 every 6  hrs as needed for joint pain 15)  Fluticasone Propionate 50 Mcg/act Susp (Fluticasone propionate) .Marland Kitchen.. 1 spray two times a day 16)  Hydroxychloroquine Sulfate 200 Mg Tabs (Hydroxychloroquine sulfate) .Marland Kitchen.. 1-2 x daily 17)  Meloxicam 7.5 Mg Tabs (Meloxicam) .Marland Kitchen.. 1 two times a day as needed chest wall pain 18)  Fentanyl 12 Mcg/hr Pt72 (Fentanyl) .Marland Kitchen.. 1-2  patch every 3 days 19)  Meloxicam 7.5 Mg Tabs (Meloxicam) .Marland Kitchen.. 1 two times a day as needed for pain  Patient Instructions: 1)  Films @ [employer] office. To ER with ths note if you get worse. Prescriptions: MELOXICAM 7.5 MG TABS (MELOXICAM) 1 two times a day as needed for  pain  #30 x 0   Entered and Authorized by:   Unice Cobble MD   Signed by:   Unice Cobble MD on 09/15/2010   Method used:   Electronically to        [facility] [phone]* (retail)       [place], [place]  [id number]       Ph: [phone] or [phone]       Fax: [phone]   RxID:   [id number]    Orders Added: 1)  Est. Patient Level IV [id number] 2)  Venipuncture [id number] 3)  T-D-Dimer Fibrin Derivatives Quantitive [id number] 4)  T-2 View CXR [71020TC] 5)  T-Ribs Unilateral 2 Views [71100TC] 6)  Prescription Created Electronically [phone]

## 2010-09-23 NOTE — Letter (Signed)
Summary: M/M [facility]  M/M [facility]   Imported By: Laural Benes 09/19/2010 12:24:56  _____________________________________________________________________  External Attachment:    Type:   Image     Comment:   External Document

## 2010-09-24 ENCOUNTER — Other Ambulatory Visit (HOSPITAL_COMMUNITY): Payer: Self-pay | Admitting: Oncology

## 2010-09-24 ENCOUNTER — Encounter (HOSPITAL_BASED_OUTPATIENT_CLINIC_OR_DEPARTMENT_OTHER): Payer: Medicare Other | Admitting: Hematology and Oncology

## 2010-09-24 DIAGNOSIS — E538 Deficiency of other specified B group vitamins: Secondary | ICD-10-CM

## 2010-09-24 DIAGNOSIS — D696 Thrombocytopenia, unspecified: Secondary | ICD-10-CM

## 2010-09-24 DIAGNOSIS — D693 Immune thrombocytopenic purpura: Secondary | ICD-10-CM

## 2010-09-24 LAB — COMPREHENSIVE METABOLIC PANEL
ALT: 9 U/L (ref 0–35)
AST: 15 U/L (ref 0–37)
Calcium: 9.1 mg/dL (ref 8.4–10.5)
Chloride: 100 mEq/L (ref 96–112)
Creatinine, Ser: 1.65 mg/dL — ABNORMAL HIGH (ref 0.40–1.20)

## 2010-09-24 LAB — CBC WITH DIFFERENTIAL/PLATELET
BASO%: 0.5 % (ref 0.0–2.0)
HCT: 31.6 % — ABNORMAL LOW (ref 34.8–46.6)
MCHC: 34 g/dL (ref 31.5–36.0)
MONO#: 0.3 10*3/uL (ref 0.1–0.9)
RBC: 3.31 10*6/uL — ABNORMAL LOW (ref 3.70–5.45)
RDW: 13.6 % (ref 11.2–14.5)
WBC: 4.4 10*3/uL (ref 3.9–10.3)
lymph#: 1 10*3/uL (ref 0.9–3.3)

## 2010-10-01 ENCOUNTER — Ambulatory Visit (INDEPENDENT_AMBULATORY_CARE_PROVIDER_SITE_OTHER): Payer: Medicare Other | Admitting: Internal Medicine

## 2010-10-01 ENCOUNTER — Encounter: Payer: Self-pay | Admitting: Internal Medicine

## 2010-10-01 VITALS — BP 115/70 | HR 75 | Resp 14 | Wt 178.0 lb

## 2010-10-01 DIAGNOSIS — I10 Essential (primary) hypertension: Secondary | ICD-10-CM | POA: Insufficient documentation

## 2010-10-01 DIAGNOSIS — R609 Edema, unspecified: Secondary | ICD-10-CM

## 2010-10-01 DIAGNOSIS — I428 Other cardiomyopathies: Secondary | ICD-10-CM

## 2010-10-01 NOTE — Assessment & Plan Note (Addendum)
See below

## 2010-10-01 NOTE — Assessment & Plan Note (Signed)
Managed well with Lasix. Discussed use of compression hose but she was not interested.

## 2010-10-01 NOTE — Patient Instructions (Signed)
Your physician wants you to follow-up in: 1 year. You will receive a reminder letter in the mail two months in advance. If you don't receive a letter, please call our office to schedule the follow-up appointment.  

## 2010-10-01 NOTE — Assessment & Plan Note (Signed)
BP well controlled. I worry about her BP dropping too low at times and risk of recurrent falls. She will continue to monitor closely. Can cut back on carvedilol as needed. She will continue to follow with Dr. Linna Darner.

## 2010-10-01 NOTE — Assessment & Plan Note (Signed)
Resolved. No evidence of recurrence.

## 2010-10-01 NOTE — Progress Notes (Signed)
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 Cardiac catheterization in 2005 when she suffered a non-ST elevation myocardial infarction, but had only 70-80% lesion in an OM branch at that time.  EF was 29%. Follow-up echo 2009 was 55%. She also history of severe hypertension and lower extremity edema.  Returns for yearly f/u. Back in January had a fall which she attributes to low BP. Had severe side pain which she still struggles with. Did some vacuuming about 2 weeks ago and pain got worse. Saw Dr. Linna Darner who got CT which showed no PE but several non-healed rib fractures. Denies CP, dyspnea, orthopnea. Remains independent and active. Still driving.  + arthitis. BP has been very well controlled. Typically systolics 0000000. Occasionally BP drops below 100 and feels poorly. Follows closely with Dr. Linna Darner. Continues to struggle with LE edeam but responds well to Lasix.    ROS: All systems negative except as listed in HPI, PMH and Problem List.  Past Medical History  Diagnosis Date  . 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 Resolved. --dx'd on cath 2005. EF 29% with minimal distal CAD (small OM1 70-80).  Echo 10/09 EF 55%  . Hypertension     SEVERE  . Idiopathic thrombocytopenic purpura (ITP)     DR. Jamse Arn  . Granuloma annulare   . History of phlebitis   . Lower extremity edema   . Osteoporosis   . Colitis, ischemic   . Hx of colonic polyps   . Lumbar stenosis 2004    DR. MARK ROY  . AV block, 1st degree     Current Outpatient Prescriptions  Medication Sig Dispense Refill  . benazepril (LOTENSIN) 20 MG tablet 1 tab po bid      . beta carotene w/minerals (OCUVITE) tablet Take 1 tablet by mouth daily.        . brimonidine (ALPHAGAN) 0.2 % ophthalmic solution Place 1 drop into the right eye 2 (two) times daily.        . Calcium Carbonate-Vitamin D (CALTRATE  600+D) 600-400 MG-UNIT per tablet Take 2 tablets by mouth daily.        . carboxymethylcellulose (REFRESH TEARS) 0.5 % SOLN Place 2 drops into both eyes 2 (two) times daily.        . cyanocobalamin (,VITAMIN B-12,) 1000 MCG/ML injection Inject 1,000 mcg into the muscle every 30 (thirty) days.        . fluticasone (FLONASE) 50 MCG/ACT nasal spray 1 spray by Nasal route 2 (two) times daily.        . furosemide (LASIX) 40 MG tablet Take 40 mg by mouth daily as needed. PT TAKES 1/2 (HALF) TO 1 TABLET DAILY PRN       . gabapentin (NEURONTIN) 100 MG capsule Take 100 mg by mouth 3 (three) times daily. 1 CAP EVERY 8 HOURS PRN FOR LEG PAIN       . meloxicam (MOBIC) 7.5 MG tablet Take 7.5 mg by mouth daily as needed. 1 TAB BID PRN FOR CHEST WALL PAIN       . Misc Natural Products (OSTEO BI-FLEX ADV JOINT SHIELD) TABS Take 2 tablets by mouth daily.        Marland Kitchen omeprazole (PRILOSEC) 20 MG capsule Take 20 mg by mouth daily.        . timolol (TIMOPTIC) 0.25 % ophthalmic solution Place 1 drop into the right eye 2 (two) times daily.        Marland Kitchen  traMADol (ULTRAM) 50 MG tablet Take 50 mg by mouth every 6 (six) hours as needed.        Marland Kitchen DISCONTD: carvedilol (COREG) 25 MG tablet Take 25 mg by mouth 2 (two) times daily. PT TAKE 1/2 TAB TWICE DAILY      . DISCONTD: fentaNYL (DURAGESIC - DOSED MCG/HR) 12 MCG/HR Place 1 patch onto the skin every 3 (three) days.        Marland Kitchen DISCONTD: hydroxychloroquine (PLAQUENIL) 200 MG tablet Take 1 mg by mouth daily. PT TAKES 1 TAB 1-2 TIMES DAILY       . DISCONTD: Omega-3 Fatty Acids (FISH OIL) 1000 MG CAPS Take 1 capsule by mouth daily.           PHYSICAL EXAM: Filed Vitals:   10/01/10 1536  BP: 115/70  Pulse: 75  Resp: 14  Elderly. No acure distress. HEENT: normal Neck: supple. no JVD. Carotids 2+ bilat; no bruits. No lymphadenopathy or thryomegaly appreciated. Cor: PMI nondisplaced. Regular rate & rhythm. No rubs, gallops, murmur. Lungs: clear Abdomen: soft, nontender,  nondistended. No hepatosplenomegaly. No bruits or masses. Good bowel sounds. Extremities: no cyanosis, clubbing, rash, edema Neuro: alert & orientedx3, cranial nerves grossly intact. moves all 4 extremities w/o difficulty. affect pleasant    ECG: NSR 75 LAFB No ST-T wave abnormalities.    ASSESSMENT & PLAN:

## 2010-10-06 ENCOUNTER — Encounter: Payer: Self-pay | Admitting: Internal Medicine

## 2010-10-22 ENCOUNTER — Encounter (HOSPITAL_BASED_OUTPATIENT_CLINIC_OR_DEPARTMENT_OTHER): Payer: Medicare Other | Admitting: Hematology and Oncology

## 2010-10-22 DIAGNOSIS — E538 Deficiency of other specified B group vitamins: Secondary | ICD-10-CM

## 2010-11-19 ENCOUNTER — Encounter (HOSPITAL_BASED_OUTPATIENT_CLINIC_OR_DEPARTMENT_OTHER): Payer: Medicare Other | Admitting: Hematology and Oncology

## 2010-11-19 DIAGNOSIS — E538 Deficiency of other specified B group vitamins: Secondary | ICD-10-CM

## 2010-11-25 NOTE — Discharge Summary (Signed)
[name] NO.:  [id number]   MEDICAL RECORD NO.:  [id number]          PATIENT TYPE:  OBV   LOCATION:  [id number]                         FACILITY:  [facility]   PHYSICIAN:  Daisy Bjornstad, MD, FACCDATE OF BIRTH:  1923-06-10   DATE OF ADMISSION:  12/13/2007  DATE OF DISCHARGE:  12/14/2007                               DISCHARGE SUMMARY   Cardiologist previously Dr. Sabino Fry.  The patient will be  followed by [name] at this time.   Primary care is Daisy Czar, MD.   DISCHARGING DIAGNOSES:  1. Symptomatic supraventricular tachycardia which resolved in the      emergency room.  Supraventricular tachycardia in the setting of      nausea and vomiting and hypokalemia.  The patient received      Lopressor IV in the emergency room converting to normal sinus      rhythm with a ventricular rate in the 80s.  2. Abnormal EKG.  During this hospitalization, the patient with      pronounced T-wave inversions in lead III and V1 and V2.  Reviewed      extensively by [name].  The patient with no chest discomfort and      negative cardiac enzymes this admission.   Past medical history includes;  1. Questionable Takotsubo by cardiac catheterization in 2005.  Cardiac      catheterization done in 2005 revealed no critical disease with the      exception of some disease in the distal portion of the second OM at      70-80%.  EF at that time during catheterization was 29%.  2. Palpitations.  3. Hypertension.  4. Glaucoma.  5. Non-ST elevated MI in 2000.  6. Hyperlipidemia.  7. Osteoporosis.  8. Thrombocytopenia.  9. Status post left knee arthroscopic surgery.  10.Appendectomy.  11.Hysterectomy.  12.Rotator cuff repair.  13.Cataract surgery slash corneal transplant.   HOSPITAL COURSE:  [name] is an 77 year old Caucasian female with  previous cardiac history as stated above, who presented to [facility]  Emergency Room complaining of palpitations,  nausea, and vomiting.  She  initially called her primary care physician, [name] who referred her  to the ER.  Note [name] just had left knee surgery done last  Wednesday.  In the ER, [name] was found to be in SVT at a rate of  153.  Her blood pressure was 175/117.  She was given IV Lopressor by the  ER physician and converted to normal sinus rhythm with a rate in the  80s.  No further episodes of palpitations, lightheadedness, or  dizziness.  The patient was seen in the ER by [name] and  [name], nurse practitioner.  The patient was admitted for 23-  hour observation with plans to check echocardiogram and lab work.  Dr.  Ron Fry in to see the patient on day of discharge.  12-lead EKG evaluated.  The patient noted to have T-wave inversions in lead III and V2 which  were different from the EKG done on December 13, 2003.  In review  of  patient's lab work, cardiac enzymes have been negative.  Repeat 12-lead  EKG.  Results pending at this time, but if no acute changes, the patient  will be discharged home.  We will note an addendum EKG results.  The  patient's cardiac markers, troponin 0.01, TSH of 0.916, magnesium 2.1.  B-MET, potassium was low at 3.2.  The patient was treated with p.o.  supplement.  BUN and creatinine 18 and 0.84.  H&H stable at 13.4 and  38.5.  The patient is being discharged home.  Her Toprol-XL has been  switched to metoprolol tartrate 25 mg b.i.d., benazepril 20 mg daily,  isosorbide 30 mg daily, aspirin 325 daily, Prilosec daily.  The patient  instructed to continue her eye drops include timolol and brimonidine.  Previous doses of Caltrate and multivitamin.  Tramadol and Vicodin as  previously prescribed.  Fish oil as previously prescribed, and her  furosemide 40 mg states she takes 1-1-1/2 tablets a day as needed as  previously prescribed.  I have asked the patient to hold off on taking  any furosemide until blood work rechecked.  We will arrange  for the  patient to have blood work drawn in the office next week to re-evaluate  electrolytes.  The potassium is stable.  I see no reason she cannot  continue her Lasix as needed at that time.   DURATION OF DISCHARGE ENCOUNTER:  Less than 30 minutes.      [name], ACNP      Daisy Bjornstad, MD, [facility]  Electronically Signed    MB/MEDQ  D:  12/14/2007  T:  12/15/2007  Job:  [id number]   cc:   Daisy Penna. Linna Darner, MD,FACP,FCCP

## 2010-11-25 NOTE — H&P (Signed)
[name], [name]             ACCOUNT NO.:  [id number]   MEDICAL RECORD NO.:  [id number]          PATIENT TYPE:  OBV   LOCATION:  [id number]                         FACILITY:  [facility]   PHYSICIAN:  Shaune Pascal. Bensimhon, MDDATE OF BIRTH:  06-01-1923   DATE OF ADMISSION:  12/13/2007  DATE OF DISCHARGE:                              HISTORY & PHYSICAL   PRIMARY CARDIOLOGIST:  Formerly Dr. Sabino Snipes, now Dr. Glori Bickers.   PRIMARY CARE PHYSICIAN:  Garnette Czech, MD   HISTORY OF PRESENT ILLNESS:  This is an 77 year old Caucasian female  with history of takotsubos by cath in 2005, with complaints of  palpitations on and off since the weekend (4 days).  This a.m. however  she woke up with her heart fluttering which she described as severe.  She could not take deep breath.  She felt weak.  She has some nausea and  vomiting of which she vomited up her morning meds.  As a result of this  she called her primary care physician, Dr. Huey Bienenstock, who referred her to  the ER.   The patient has had recent left knee surgery last Wednesday and  arthroscopic surgery by Dr. Telford Nab.  The patient is still having some  soreness from that left knee and uses the cane for ambulation but  otherwise no other complaints.   The patient also noticed high blood pressure this a.m. at home, where  she used her home blood pressure medication which correlated with ER  admission.  Blood pressure initially 175/117 with the heart rate of 151,  did increase to 182/107 with the heart rate of 158.  She was given IV  Lopressor by ER MD and converted to normal sinus rhythm with a  ventricular rate in the 80s.  She is currently pain free and resting  comfortably.   REVIEW OF SYSTEMS:  Positive for heart fluttering, palpitations, mild  shortness of breath, mild pressure in her chest, some lightheadedness,  nausea, and vomiting.  Otherwise, she is without complaints.   PAST MEDICAL HISTORY:  Includes hypertension,  glaucoma, non-ST-elevated  MI in 2005, hyperlipidemia, osteoporosis, and Takasubo cardiomyopathy  also thrombocytopenia, most recent cath in June 2005, revealing no  critical disease with the exception of some disease and the distal  portion of the second OM at 70-80% with an LVEF during cath at 29%.   PAST SURGICAL HISTORY:  Recent left knee arthroscopy approximately 1-  week ago, appendectomy, hysterectomy, rotator cuff repair, cataract  surgery and corneal transplant.   SOCIAL HISTORY:  Lives in [name] alone.  She has a son who is at  bedside who is very attentive.  She does not smoke.  She does not drink  alcohol.  She is a widow.   FAMILY HISTORY:  Mother died of complications from CHF.  Father died of  complications from colon cancer.  She has 2 sisters, one who is deceased  from brain aneurysm, the second is deceased from cancer and CHF.   CURRENT MEDICATIONS:  1. Caltrate 600 mg once a day.  2. Glucosamine 150 mg once a day.  3.  Prilosec 20 mg once a day.  4. Fish oil 1200 mg daily.  5. Benazepril 20 mg daily.  6. Frusemide 40 mg 1-1/2 to 1 tablet daily as needed.  7. Metoprolol 25 mg daily.  8. Brimonidine eye drops and timolol eye drops to right eye daily.  9. Isosorbide 30 mg daily.  10.Tramadol 50 mg p.r.n.  11.Vicodin 5/325 postoperative pain.   ALLERGIES:  PENICILLIN and MYCIN.   LABORATORY DATA:  Sodium 140, potassium 3.8, chloride 105, CO2 of 22,  BUN 22, creatinine 1.0, and glucose 142.  Hemoglobin 13.4, hematocrit  38.5, white blood cells 8.9, and platelets 130.  CK 129, MB is less than  1.0, troponin less than 0.05.  PTT 27, PT 14.1, and INR 1.1.  Radiology,  chest x-ray revealing no PE with no active lung disease.  EKG currently  reveals normal sinus rhythm with the ventricular rate of 69 beats per  minute.  Admission EKG revealed sinus tachycardia, ventricular rate of  153 beats per minute.   PHYSICAL EXAMINATION:  VITAL SIGNS:  Blood pressure  153/72, heart rate  77, respirations 15, temperature 98.5, and O2 sat 98% on room air.  HEENT:  Head is normocephalic and atraumatic.  Eyes PERRLA.  Mucous  membranes pink and moist..  pink and moist.  Tongue is midline.  NECK:  Supple.  There is no JVD.  No carotid bruits appreciated.  No  thyromegaly is seen.  CARDIOVASCULAR:  Regular rate and rhythm with occasional extrasystole  without murmurs, rubs, or gallops.  Pulses are 2+ and equal bilaterally  without bruits.  LUNGS:  Clear to auscultation.  ABDOMEN:  Soft, nontender, and 2+ bowel sounds.  EXTREMITIES:  Without clubbing, cyanosis, or edema.  However, she does  have some left knee soreness and mild swelling at the sight only.  NEURO:  Cranial nerves II through XII are grossly intact.   IMPRESSION:  1. Symptomatic supraventricular tachycardia.  2. Takotsubo cardiomyopathy.  3. Hypertension.  4. Non-ST-elevated myocardial infarction in 2005.  5. Hyperlipidemia.  6. Osteoporosis.  7. Thrombocytopenia.   PLAN:  This is an 77 year old Caucasian female who is formerly a patient  of Dr. Sabino Snipes and is now being assigned to Dr. Glori Bickers,  who has been seen and examined by Dr. Haroldine Laws with symptomatic SVT,  now broken.  We will admit for 23-hour observation, check  echocardiogram, TSH, supplement K, and check mag.  We will change Toprol to Lopressor 25 mg twice a day, beta-blocker will  be limited secondary to bradycardia presently.  Of note, further SVT, we  will discharge home in a.m. with the Card[employer]Net monitor.  If she has  recurrent SVT, we will consult EP to see for possible RFA.      Phill Myron. Purcell Nails, NP      Starr School Bensimhon, MD  Electronically Signed    KML/MEDQ  D:  12/13/2007  T:  12/14/2007  Job:  [id number]   cc:   Garnette Czech, MD

## 2010-11-25 NOTE — Assessment & Plan Note (Signed)
[name]                            CARDIOLOGY OFFICE NOTE   NAME:Fry, Daisy DANIAL                    MRN:          [id number]  DATE:05/07/2008                            DOB:          03/02/1923    PRIMARY CARE PHYSICIAN:  Darrick Penna. Linna Darner, MD, FACP, [facility]   INTERVAL HISTORY:  Ms. [name] is an 77 year old woman who was previously  followed by Dr. Albertine Patricia.  She presents today for her first visit with me.   She has a history of nonischemic cardiomyopathy thought to be tako-tsubo  based on cardiac catheterization in 2005 when she suffered a non-ST  elevation myocardial infarction, but had only 70-80% lesion in an OM  branch at that time.  EF was 29%.  She also has a history of recurrent  SVT.  I think she was hospitalized for this earlier in the year.   She also has a history of severe hypertension.  She presents today for  followup.  Overall, she says she is feeling fairly well.  She has a hard  time getting around due to the leg pain.  She has occasional  palpitations.  Sometimes these are few times a month, sometimes less.  These can last about an hour.  She takes an extra dose of metoprolol and  seems to get better.  She has not had a markedly prolonged episode.  She  denies any syncope or presyncope.   She continues to have problems with lower extremity edema.  She takes  Lasix once or twice a month when needed.  She tries to avoid because it  makes her feel weak.  She has been taking her blood pressure at home and  notes her systolics in the [id number] range.  She denies associated  neurologic defects.   She has not had any chest pain.  She does have chronic dyspnea.  No  orthopnea or PND.  She had an echocardiogram today, which I reviewed  briefly.  Ejection fraction is normalized and EF is 55-60%.  She has  significant diastolic dysfunction with elevated LV pressures.  There is  mild tricuspid regurgitation with mild-to-moderate pulmonary  hypertension.  There is trivial aortic insufficiency and mitral  regurgitation.   CURRENT MEDICATIONS:  1. Metoprolol 25 b.i.d.  2. Benazepril 20 a day.  3. Imdur 30 a day.  4. Prilosec 20 a day.  5. Fish oil 1000 a day.  6. Lasix 40 p.r.n.  7. B12 and other vitamins.   PHYSICAL EXAMINATION:  GENERAL:  She is an elderly woman in no acute  distress.  VITAL SIGNS:  Blood pressure initially was 210/120, when I rechecked it  manually was 226/92, heart rate was 57, weight was 179.  HEENT:  Normal.  NECK:  Supple.  JVP is about 7-8 cm of water with prominent CV waves.  Carotids are 2+ bilaterally without bruits.  There is no lymphadenopathy  or thyromegaly.  CARDIAC:  She has a regular rate and rhythm with an S4.  No obvious  murmurs.  LUNGS:  Clear.  ABDOMEN:  Soft, nontender, nondistended.  No hepatosplenomegaly, no  bruits, no masses.  Good bowel sounds.  EXTREMITIES:  Warm with no  cyanosis or clubbing.  There is 1 to 2+ edema bilaterally.  No rash.  NEUROLOGICAL:  Alert and oriented x3.  Cranial nerves II through XII are  intact.  Moves all four extremities without difficulty.  Affect is  pleasant.   EKG shows sinus bradycardia with LVH at a rate of 57.  There is a first-  degree AV block at 204 milliseconds.   ASSESSMENT AND PLAN:  1. Severe hypertension.  Her blood pressure is markedly elevated.  I      suspect she may also have a component of white coat hypertension.      We have given her a dose of clonidine here in clinic to get her      systolics down around [id number].  I am going to have her keep a blood      pressure log at home, taking her blood pressures twice a day.  We      will double her benazepril to 20 b.i.d. and also add spironolactone      12.5 once a day.  We have chosen not to start Norvasc at this time      due to her lower extremity edema.  I have asked her to follow up      with Dr. Linna Darner in 2 weeks.  She will also get a BMET to check a      potassium next  week.  2. Lower extremity edema.  We are starting spironolactone, this should      help.  We may need to be little bit more aggressive with her Lasix.  3. Supraventricular tachycardia, this is stable.  We discussed the      possibility of an ablation if it gets more frequent.  If she is      having more episodes, we would put a monitor on her and have her      follow up with EP.   DISPOSITION:  We will see her back in several months for followup.     Shaune Pascal. Bensimhon, MD  Electronically Signed    DRB/MedQ  DD: 05/07/2008  DT: 05/08/2008  Job #: [id number]   cc:   Darrick Penna. Linna Darner, MD,FACP,FCCP

## 2010-11-25 NOTE — Assessment & Plan Note (Signed)
[place]                            CARDIOLOGY OFFICE NOTE   NAME:[name], [name]                    MRN:          [id number]  DATE:02/01/2008                            DOB:          1923/05/14    PRIMARY CARDIOLOGIST:  Shaune Pascal. Bensimhon, MD   HISTORY OF PRESENT ILLNESS:  This is a very pleasant 77 year old white  female patient, who was recently hospitalized for 23 hours with  symptomatic SVT, which resolved in the emergency room.  This occurred in  the setting of nausea, vomiting, and hypokalemia.  She received IV  Lopressor and converted to normal sinus rhythm.  She did develop  pronounced T-wave inversion in leads III, V1, and V2 during this time,  but she had no chest discomfort, and cardiac enzymes were negative.  She  does have a question of Tako-Tsubo by cath in 2005.  Otherwise, no  critical disease except for some disease in the distal portion of the  second OM 70-80%, and EF was 29% at that time.   Since the patient has been home, she has had a couple of short-lived  episodes of palpitations last Sunday.  She stated her heart rate got up  to 138.  She laid down and it went away eventually.  She states she does  get a little tightening in her chest when this happens, but eases  spontaneously.  She states it has much improved since she is on the  metoprolol.  Overall, she has been getting along well.   CURRENT MEDICATIONS:  1. Metoprolol 25 mg b.i.d.  2. Benazepril 20 mg daily.  3. Isosorbide 30 mg daily.  4. Prilosec 20 mg daily.  5. Timolol eye drops daily.  6. Brimonidine 0.2% daily.  7. Caltrate 600 mg 2 daily.  8. Fish oil.  9. Ocuvite.  10.Osteo Bi-Flex daily.  11.Furosemide 40 mg p.r.n.   PHYSICAL EXAMINATION:  GENERAL:  This is a very pleasant, young-looking  77 year old white female in no acute distress.  VITALS SIGNS:  Blood pressure 148/96, pulse 78, and weight 172.  NECK:  Without JVD, HJR, bruit, or thyroid  enlargement.  LUNGS:  Clear anterior, posterior and lateral.  HEART:  Regular rate and rhythm at 78 beats per minute.  Normal S1 and  S2.  No murmur, rub, bruit, thrill or heave noted.  ABDOMEN:  Soft without organomegaly, masses, lesions, or abnormal  tenderness.  EXTREMITIES:  Without cyanosis, clubbing, or edema.  She has good distal  pulses.   IMPRESSION:  1. Recurrent supraventricular tachycardia converted to normal sinus      rhythm with IV Lopressor occurred in the setting of nausea,      vomiting, and hypokalemia.  2. Significant T-wave inversion in leads III, V1, and V2 during this      time, resolved when in normal sinus rhythm.  Cardiac enzymes      negative.  3. Question of Tako-Tsubo by cardiac cath in 2005, ejection fraction      29 % at that time.  4. Hypertension.  5. Non-ST-elevation myocardial infarction in 2000.  6. Hyperlipidemia.  7.  Osteoporosis.  8. Thrombocytopenia.  9. Long history of palpitations.   PLAN:  We will order a 2-D echo on this patient to follow up her left  ventricular function as this was not done.  I have told her that she can  increase or take an extra metoprolol if she does have recurrent  palpitations, and she will see Dr. Haroldine Laws back in 2-3 months.  She is  to call if she has any further problems.      Ermalinda Barrios, PA-C  Electronically Signed      Denice Bors. Stanford Breed, MD, [facility]  Electronically Signed   ML/MedQ  DD: 02/01/2008  DT: 02/02/2008  Job #: [id number]

## 2010-11-25 NOTE — Discharge Summary (Signed)
[name], [name]             ACCOUNT NO.:  [id number]   MEDICAL RECORD NO.:  [id number]          PATIENT TYPE:   LOCATION:                                 FACILITY:   PHYSICIAN:  Rosanne Sack, ACNP       DATE OF BIRTH:   DATE OF ADMISSION:  DATE OF DISCHARGE:                               DISCHARGE SUMMARY   ADDENDUM.   In review of final EKG, no new changes noted.  The patient is chest pain  free, maintained on sinus rhythm.  I have arranged for her to have B-MET  drawn on Monday at our office on [facility] and then follow up with  [name] for Dr. Pierre Bali in July 2009.  She knows to call  our office for any concerns or issues prior to that appointment.      Rosanne Sack, ACNP     MB/MEDQ  D:  12/14/2007  T:  12/15/2007  Job:  [id number]   cc:   Posey Boyer, MD

## 2010-11-28 NOTE — Discharge Summary (Signed)
NAME:  [name], [name]                       ACCOUNT NO.:  [id number]   MEDICAL RECORD NO.:  [id number]                   PATIENT TYPE:  INP   LOCATION:  [id number]                                 FACILITY:  [facility]   PHYSICIAN:  Ethelle Lyon, M.D. [facility]         DATE OF BIRTH:  15-Dec-1922   DATE OF ADMISSION:  12/31/2003  DATE OF DISCHARGE:  01/03/2004                                 DISCHARGE SUMMARY   PROCEDURES:  1. Cardiac catheterization.  2. Coronary arteriogram.  3. Left ventriculogram.   HOSPITAL COURSE:  [name] is a 77 year old female with no known history of  coronary artery disease.  At 11 a.m. on the day of admission she had sudden  onset of substernal chest discomfort radiating to her left arm and  associated with nausea and vomiting.  She took Advil, Tums and Pepcid  without relief. She saw her primary care physician at 2:30 and was  transported to the emergency room.  Her chest pain was 8/10 on initial  evaluation.  She was treated with sublingual nitroglycerin which dropped her  pressure but this resolved with a 500 cc fluid bolus.  After that she was  started on low dose IV nitroglycerin and given aspirin and heparin.  Her  pain improved to a 1/10.  She had never had any symptoms before.  She was  admitted for evaluation and treatment.   Her enzymes were elevated indicating an MI and she had Integrilin added to  her medication regimen.  It was felt that cardiac catheterization was  indicated and this was performed on 01/01/2004.  The cardiac catheterization  showed no critical coronary artery disease.  A distal portion of the OM2 had  a 70-80% stenosis, but this was small vessel and there was no other  significant disease.  Her EF was 29% and she had intra-apical and infra-  apical akinesis in a pattern consistent with tachy__________cardiomyopathy.  Medical therapy was recommended.   The next day she was without chest pain.  She was seen by cardiac rehab and  ambulated without chest pain or shortness of breath.  She was given  education on CHF and MI, activity restrictions and referred for outpatient  cardiac rehab.   As part of her elevation a lipid profile was checked.  Her total cholesterol  was 148 with triglycerides of 100, HDL 35, LDL 93.  She had Lipitor added to  her medication regimen and will need follow up LFTs and lipid profile in 6-  12 weeks. Of note, her potassium was low at 3.4 on admission and  supplemented with 40 mEq, but after the surgery and IV hydration she was  3.3.  She was again supplemented and a recheck is pending at the time of  dictation.   Her initial chest x-ray showed a prominence in the right hilum and a 2-view  was recommended and this was performed. The 2-view chest x-ray showed  basilar patchy  markings that were consistent with either infiltrate,  atelectasis, or scar.  The right hilum was unremarkable.   By 01/03/2004 [name] was feeling much better.  She was ambulating without  chest pain or shortness of breath.  She was tentatively stable for discharge  pending  MD evaluation with outpatient follow up arranged.   DISCHARGE CONDITION:  Improved.   DISCHARGE DIAGNOSES:  1. Non-ST segment elevation MI, tachy__________cardiomyopathy without     critical coronary artery disease by catheterization.  2. Hyperlipidemia.  3. Hypertension.  4. Hyperglycemia with a normal hemoglobin A1c.  5. History of osteoarthritis.  6. Osteoporosis.  7. Allergies to MYCINS and PENICILLIN.  8. Status post left corneal transplant, hysterectomy, bilateral cataract     surgery, right rotator cuff and appendectomy.   DISCHARGE INSTRUCTIONS:  1. Activity levels to include no driving for 5 days and no strenuous     activity until cleared by MD.  She is to exercise per cardiac rehab     guidelines.  2. She is to stick to a diet that is low in fat and salt.  3. She is to weigh herself daily, and call for 5-pound gains.  4.  She is to follow up with the PA for Dr. Albertine Patricia on July 11 at 4 p.m.  5. Follow up with Dr. Linna Darner as needed.   DISCHARGE MEDICATIONS:  1. HCTZ/triamterene 25/37.5 mg is on HOLD.  2. Actonel every week.  3. Betimol and Predforte eye drops as prior to admission.  4. AccuSite, calcium, and glucosamine as prior to admission.  5. Aspirin 325 mg daily.  6. Lipitor.  7. Toprol XL 25 mg daily.  8. Lisinopril 5 mg daily.  9. Nitroglycerin p.r.n.      Rosaria Ferries, P.A. [facility]                  Ethelle Lyon, M.D. [facility]    RB/MEDQ  D:  01/03/2004  T:  01/05/2004  Job:  [id number]   cc:   Darrick Penna. Linna Darner, M.D. [facility]   Ethelle Lyon, M.D. [facility]  [address]  [place] [address]

## 2010-11-28 NOTE — Consult Note (Signed)
[name], [name]             ACCOUNT NO.:  [id number]   MEDICAL RECORD NO.:  [id number]          PATIENT TYPE:  INP   LOCATION:  3706                         FACILITY:  [facility]   PHYSICIAN:  Tory Emerald. Benson Norway, MD    DATE OF BIRTH:  1922-08-24   DATE OF CONSULTATION:  04/30/2006  DATE OF DISCHARGE:  05/02/2006                                   CONSULTATION   GASTROENTEROLOGY CONSULTATION FOR [place]:   REASON FOR CONSULTATION:  Hematochezia and abdominal pain.   HISTORY OF PRESENT ILLNESS:  This is an 77 year old female with a past  medical history of bilateral cataract surgeries with two corneal transplants  status post TAH for endometriosis and status post colonoscopy in March 2007  with findings of adenomas and diverticulosis and history of cardiomyopathy  who is admitted to the hospital with acute onset of abdominal pain and  hematochezia.  The patient states that at 12 midnight yesterday she started  to experience an acute onset of hematochezia and abdominal pain that woke  her up from sleep.  Since that time she had too-numerous-to-count bowel  movements which are bloody with clots.  The patient denies any chest pain or  shortness of breath but it is associated with abdominal pain and she noted  having a fever of approximately 100.1.  The patient subsequently presented  to her primary care [name], Dr. Linna Fry who subsequently admitted her to  the hospital for further evaluation and treatment.  The patient did undergo  a colonoscopy by Dr. Scarlette Shorts earlier this year with findings of an  adenoma and diverticulosis but no other significant findings.  At this time  the patient is stable but she does have some abdominal discomfort that  extends from the left lower quadrant up to the left upper quadrant.   PAST MEDICAL HISTORY AND PAST SURGICAL HISTORY:  As stated above.   FAMILY HISTORY:  Noncontributory.   SOCIAL HISTORY:  The patient denies any alcohol, tobacco or illicit  drug  use.   MEDICATIONS:  Caltrate, glucosamine, Ocuvite, Pred-Forte, Alphagan,  Prilosec.   ALLERGIES:  ALLERGIES TO PENICILLIN, STREPTOMYCIN AND FOSAMAX.   REVIEW OF SYSTEMS:  Negative for any chest pain, shortness of breath,  dizziness; positive for fever, no chills; no arthritis, arthralgias; no  dysuria, dysarthria, dysphagia, diarrhea, constipation, coughs or colds.   PHYSICAL EXAMINATION:  VITAL SIGNS:  Blood pressure is 175/81, heart rate is  78, temperature is 99.8, respirations 20.  GENERAL:  The patient is in no acute distress, alert and oriented.  HEENT:  Normocephalic, atraumatic.  Extraocular muscles intact.  Pupils  equal and round, reactive to light.  Neck is supple.  No lymphadenopathy.  RESPIRATORY:  Lungs were clear to auscultation bilaterally.  CARDIOVASCULAR:  Regular rate and rhythm.  ABDOMEN:  Mildly obese, soft, is tender in the left upper quadrant and left  lower quadrant.  No rebound or rigidity.  Positive bowel sounds.  EXTREMITIES:  No clubbing, cyanosis or edema.  RECTAL:  Examination reveals gross blood on the examination finger.   LABORATORY VALUES:  White blood cell count is 12.4, hemoglobin  is 13.7, MCV  is 94.4, platelets at 74.  PT is 13.7, INR 1.0, PTT is 24.  Sodium 139,  potassium 3.4, chloride is 106, CO2 25, glucose 139, BUN is 23, creatinine  0.9.   IMPRESSION:  1. Ischemic colitis.  2. Thrombocytopenia.   After evaluation of the patient, my impression is that she does have  ischemic colitis as she has the constellation symptoms of hematochezia that  is associated with abdominal pain and also elevation of her white blood cell  count and mild fever.  Diverticular bleeds typically do not present in this  manner, it is typically painless hematochezia.  Overall the prognosis is  very good for this type of etiology and no specific interventions are  required, however, I do agree with institution of IV antibiotics at this  time.  Further  evaluation will need to be performed in order to confirm this  diagnosis with a flexible sigmoidoscopy.  Plan is to perform a flexible  sigmoidoscopy tomorrow morning with probable biopsies.  Further  recommendations pending the examination      Tory Emerald. Benson Norway, MD  Electronically Signed     PDH/MEDQ  D:  04/30/2006  T:  05/02/2006  Job:  [id number]   cc:   Darrick Penna. Daisy Darner, MD,FACP,FCCP

## 2010-11-28 NOTE — H&P (Signed)
NAME:  MARIFER, AMAYA NO.:  [id number]   MEDICAL RECORD NO.:  [id number]                   PATIENT TYPE:  EMS   LOCATION:  [facility]                                 FACILITY:  [facility]   PHYSICIAN:  Ethelle Lyon, M.D. [facility]         DATE OF BIRTH:  March 09, 1923   DATE OF ADMISSION:  12/31/2003  DATE OF DISCHARGE:                                HISTORY & PHYSICAL   CHIEF COMPLAINT:  Chest pain.   HISTORY OF PRESENT ILLNESS:  Ms. [name] is a 77 year old woman without prior  history of cardiovascular disease.  At approximately 11 this morning, she  suffered the sudden onset of substernal chest discomfort radiating to her  left arm and left scapula, associated with nausea and vomiting but no  dyspnea, palpitations, or lightheadedness.  She self medicated with Advil,  Tums, and Pepcid without relief.  The pain continued essentially unchanged.  After speaking with her primary care doctor at approximately 2:30 p.m., she  subsequently presented to the emergency room.  She had continued 8/10 chest  discomfort on presentation.  Here, she was treated with nitroglycerin,  aspirin, and heparin with prompt improvement in her pain to 1/10 or less at  which it remains.  She has never had similar pain.  She denies any  antecedent exertional dyspnea, PND, orthopnea, edema, claudication, TIA, or  stroke.   PAST MEDICAL HISTORY:  1. Hypertension.  2. Glaucoma.  3. Status post left corneal transplant.  4. Status post appendectomy.  5. Status post bilateral cataracts repairs.  6. Status post right rotator cuff repair.  7. Status post TAH.   ALLERGIES:  1. STREPTOMYCIN.  2. PENICILLIN.   CURRENT MEDICATIONS:  1. HCTZ/Triamterene 25/37.5 one per day.  2. Actonel every week to the left eye.  3. Betimol b.i.d.  4. Predforte to the left eye b.i.d.  5. Ocuvite one tab b.i.d.  6. Calcium.  7. Glucosamine.   SOCIAL HISTORY:  The patient lives alone in [place].  She  is active  and does her own house cleaning and groceries.  She has a son who  accompanies her today.  She denies tobacco and alcohol use.   FAMILY HISTORY:  Remarkable for her mother dying at 35 with coronary  disease.  Father died at 19 of a malignancy.  She has two sisters, one died  of a cerebral hemorrhage apparently related to an aneurism.   REVIEW OF SYSTEMS:  Remarkable for very mild chronic exertional dyspnea  which is not limiting, urinary frequency which is chronic, and arthralgias  in her knees.  It is otherwise negative in detail except as above.   PHYSICAL EXAMINATION:  GENERAL:  This is a generally well-appearing elderly  woman in no distress.  VITAL SIGNS:  Heart rate 61, a blood pressure of 151/85, and a temperature  of 98.1.  Oxygen saturation is 100% on 2 liters.  NECK:  She has no jugular venous distention  and no thyromegaly.  LUNGS:  Scant rales at both bases.  They are otherwise clear to  auscultation.  HEART:  She has a nondisplaced point-of-maximal cardiac impulse.  There is a  regular rate and rhythm without murmur, rub, S3, or S4.  ABDOMEN:  Soft, nondistended, and nontender.  There is no hepatosplenomegaly  and no mass.  Bowel sounds are normal.  Scars are consistent with prior  hysterectomy and appendectomy.  EXTREMITIES:  Warm without clubbing, cyanosis, edema, or ulceration.  Carotid pulses are 2+ bilaterally without bruits.  Femoral pulses are 2+  bilaterally without bruit.  Dorsalis pedis pulses are 2+ bilaterally.   Chest x-ray is pending.   Electrocardiogram demonstrates a normal sinus rhythm at 61 beats per minute.  An initial electrocardiogram had 1-mm of ST elevation in lead V2 which  resolved on subsequent electrocardiograms.  There is persistent T wave  inversion and ST depression in lead V5 __________ .   LABORATORY STUDIES:  Remarkable for a hematocrit of 37, platelets 115.  Creatinine 1.3, glucose 131.  Troponin 0.76, CK-MB 3.5.  INR  1.0.   IMPRESSION/RECOMMENDATIONS:  1. Chest pain.  The patient presents with non-ST segment elevation     myocardial infarction with mild enzyme elevation and ST depressions on     electrocardiogram.  We will treat her with continued aspirin, heparin,     and nitroglycerin.  We will initiate eptifibatide and beta-blocker and     attempt to make her completely pain free.  Assuming this can be     accomplished, we will proceed to cardiac catheterization in the morning.     Risks and potential benefits were discussed with the patient and her son     who agree to proceed.  2. Lipids.  Check fasting lipid profile.  Initiate high dose statin.  3. Hypertension.  Well controlled on current therapy.  We will hold     diuretics in anticipation of cardiac catheterization.  We will initiate     beta-blocker instead.  4. Thrombocytopenia.  Observe closely on antiplatelet therapy.  5. Elevated glucose.  Check hemoglobin A1c.                                                Ethelle Lyon, M.D. [facility]    WED/MEDQ  D:  12/31/2003  T:  01/01/2004  Job:  [id number]   cc:   Darrick Penna. Linna Darner, M.D. [facility]

## 2010-11-28 NOTE — Discharge Summary (Signed)
NAMEEYTHEL, HENKEN             ACCOUNT NO.:  [id number]   MEDICAL RECORD NO.:  [id number]          PATIENT TYPE:  INP   LOCATION:  3706                         FACILITY:  [facility]   PHYSICIAN:  Sean A. Loanne Drilling, MD    DATE OF BIRTH:  1922/12/28   DATE OF ADMISSION:  04/30/2006  DATE OF DISCHARGE:  05/02/2006                                 DISCHARGE SUMMARY   REASON FOR ADMISSION:  Lower GI bleed.   HISTORY OF PRESENT ILLNESS:  This is an 77 year old woman admitted by Dr.  Linna Darner with lower GI bleed on April 30, 2006.  Please refer to his  dictated history and physical for details.   HOSPITAL COURSE:  The patient was admitted and given intravenous fluid.  She  was seen in consultation by GI who did a flexible sigmoidoscopy on May 01, 2006.  She had some inflammation in the sigmoid colon and biopsies were  taken, the results of which are pending at the time of discharge.  She  continued to feel better and by May 02, 2006 she was alert, oriented,  eating a regular diet and was ambulatory and was, thus, discharged home.  She was initially on Cipro, but GI did not recommend continuing it.   She also had thrombocytopenia during her hospitalization which changed very  little over the course of her hospitalization.   DIAGNOSES AT THE TIME OF DISCHARGE:  1. Lower GI bleed, due to nonspecific colitis.  2. Anemia due to number one.  3. Thrombocytopenia which has apparently already been evaluated by      hematology.  4. Other chronic medical problems as noted in the history and physical.   MEDICATIONS AT THE TIME OF DISCHARGE:  Same as prior to admission.   DIET AND EXERCISE:  No restriction.   FOLLOWUP:  Dr. Linna Darner within the next week.   FOCUSED FOLLOWUP:  She will need a followup CBC and she will need a full  colonoscopy if she has not had one already.           ______________________________  Jacelyn Pi Loanne Drilling, MD     SAE/MEDQ  D:  05/02/2006  T:  05/02/2006  Job:   [id number]

## 2010-11-28 NOTE — Cardiovascular Report (Signed)
NAME:  [name], [name]                       ACCOUNT NO.:  [id number]   MEDICAL RECORD NO.:  [id number]                   PATIENT TYPE:  INP   LOCATION:  4742                                 FACILITY:  [facility]   PHYSICIAN:  Junious Silk, M.D. [facility]         DATE OF BIRTH:  03/23/1923   DATE OF PROCEDURE:  01/01/2004  DATE OF DISCHARGE:                              CARDIAC CATHETERIZATION   PROCEDURE PERFORMED:  Left heart catheterization with coronary angiography  and left ventriculography.   INDICATION:  [name] is a 77 year old woman who presented to the hospital  with chest pain which had been proceeded by nausea and vomiting.  Troponin  markers were elevated consistent with a possible non-Q-wave myocardial  infarction.  EKG did not show any acute abnormalities.  She was admitted and  stabilized on medical therapy.  She was referred for cardiac  catheterization.   CATHETERIZATION PROCEDURAL NOTE:  A 6 French sheath was placed in the right  femoral artery.  Coronary angiography was performed with standard Judkins 6  French catheters.  Left ventriculography was performed with an angled  pigtail catheter.  At the conclusion of the procedure, an Angio-Seal  vascular closure device was placed in the right femoral artery with good  hemostasis.  There were no complications.   CATHETERIZATION RESULTS:   HEMODYNAMICS:  1. Left ventricular pressure 112/22.  2. Aortic pressure 116/66.  3. There is no aortic valve gradient.   LEFT VENTRICULOGRAM:  There is akinesis of the anterior apical and  inferoapical walls.  The base of the heart both anterior and inferior bases  are hyperdynamic.  Ejection fraction is calculated at 29%.   CORONARY ARTERIOGRAPHY (CO-DOMINANT):  Left main is normal.   Left anterior descending artery gives rise to a normal size first diagonal  and small second and third diagonal branch.  The LAD is normal.   Left circumflex gives rise to a small first  marginal and a very large second  obtuse marginal, small third marginal and two small posterior lateral  branches.  In the distal portion of the large second obtuse marginal branch,  there is a diffuse 70-80%.  However, the vessel is very small in caliber at  that point.   Right coronary artery is a small co-dominant vessel giving rise to a small  posterior descending artery and a small posterior lateral branch.   IMPRESSION:  1. Left ventricular systolic function in a pattern as described which is     consistent with probable Takotsubo cardiomyopathy.  2. No significant coronary artery disease identified with the exception of     some disease in the very distal portion of the second obtuse marginal     branch.   In conclusion, the etiology of the patient's chest pain and enzyme  elevations and LV dysfunction appears to be a Takotsubo cardiomyopathy.   Recommendations for medical therapy.  Junious Silk, M.D. [facility]    MWP/MEDQ  D:  01/01/2004  T:  01/02/2004  Job:  [id number]   cc:   Darrick Penna. Linna Darner, M.D. [facility]

## 2010-11-28 NOTE — H&P (Signed)
NAMEMARLEINA, METTER NO.:  [id number]   MEDICAL RECORD NO.:  [id number]          PATIENT TYPE:  INP   LOCATION:  3706                         FACILITY:  [facility]   PHYSICIAN:  Darrick Penna. Hopper, MD,FACP,FCCPDATE OF BIRTH:  April 20[birth year] 1925   DATE OF ADMISSION:  04/30/2006  DATE OF DISCHARGE:  05/02/2006                                HISTORY & PHYSICAL   Ramiah Hnat was admitted in the office on April 30, 2006, to evaluate  abdominal pain associated with rectal bleeding.   She experienced crampy pain in the lower abdomen in the left lower quadrant  with cramping across the lower abdomen.  This occurred the evening of 10/18;  as of midnight, she began to pass bright red rectal blood.  This was  associated with chills and diaphoresis, and nausea and vomiting x3.   Significantly, she has a history of diverticulosis and adenoma at  colonoscopy in March of this year.  She has had a hysterectomy for  endometriosis.  She may have residual ovarian tissue.  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  She was hospitalized remotely in 1972 with phlebitis  while on hormonal replacement therapy.  She has had a rotator cuff repair in  2004.  She was hospitalized in 2005 with chest pain and found to have  cardiomyopathy at catheterization.  Medical problems include degenerative  disk disease at L4-L5.  She has had no surgery for this.  She has also had  osteoporosis diagnosed by bone mineral density studies.  She has a history  of Fuke's disease of the eyes.  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 Dayton Martes, dermatologist.  She also has thrombocytopenia and  has been followed by Dr. Jamse Arn from whom  she also has been receiving B12  injections.   Family history is positive for osteoporosis and fractures in her mother,  colon cancer in her father, and 2 or 3 maternal aunts have had  colon cancer.   She has never smoked and does not drink.   She is allergic or intolerant to:  1. PENICILLIN.  2. STREPTOMYCIN.  3. FOSAMAX.   She is on:  1. Caltrate 600 with vitamin D twice a day.  2. Glucosamine 3 g twice daily.  3. Ocuvite 2 daily.  4. Pred Forte 1 drop in the left eye.  5. Betimol 1 drop twice a day to the right eye.  6. Alphagan 1 drop twice daily to the right eye.  7. Prilosec daily.  8. Halogetsol Propionate 0.05% topically for her granuloma annulare as      needed.   Review of systems is negative for chest pain or palpations or shortness of  breath.  She has had no genitourinary symptoms.  She has diffuse arthralgias  and is on Glucosamine as noted.  She did experience some dizziness on 10/15,  but has had no loss of consciousness or localizing neuropsychiatric deficits  or symptoms.  The remainder of the review of systems was reviewed  and was  negative.   Weight is 176.6, which is down almost 3 pounds.  Temperature is 99.1.  Pulse  64.  Regular respiratory rate 20.  Blood pressure was 126/74.  She has arterial artery narrowing.  She has postoperative changes of the  pupils bilaterally.  She has an upper partial plate.  The tympanic membranes  are dull.  Nares are patent.  She has no lymphadenopathy in the head, neck, or axilla.  Thyroid is normal  to palpation.  She has an S4 gallop with slurring.  She has mild rales, but no increase with her breathing.  She has no rubs.  She is tender in the right lower quadrant and bowel sounds are generally  decreased.  Crepitus with knees is present without fusiform enlargement or effusions.  She is generally weak, as manifested by some difficulty getting on the exam  table.  She has an irregular, serpiginous rash at the left elbow, right  forearm, and right elbow, a well as the right lower extremity in a scattered  distribution.  Other than the weakness, there were no localizing musculoskeletal or  neurologic  deficits.  Skin is otherwise warm and dry.   She is now admitted with a diverticular bleed in the context of fever and  nausea and vomiting.   She will be placed on IV antibiotics, and GI consultation will be requested.      Darrick Penna. Linna Darner, MD,FACP,FCCP  Electronically Signed     WFH/MEDQ  D:  05/03/2006  T:  05/03/2006  Job:  [id number]

## 2010-11-28 NOTE — Op Note (Signed)
NAME:  [name], [name]                       ACCOUNT NO.:  [id number]   MEDICAL RECORD NO.:  [id number]                   PATIENT TYPE:  AMB   LOCATION:  DSC                                  FACILITY:  [facility]   PHYSICIAN:  Vonna Kotyk. Durward Fortes, M.D.            DATE OF BIRTH:  01/16/1923   DATE OF PROCEDURE:  02/08/2003  DATE OF DISCHARGE:                                 OPERATIVE REPORT   PREOPERATIVE DIAGNOSIS:  1. Rotator cuff tear right shoulder with impingement.  2. Degenerative joint disease acromioclavicular joint.   POSTOPERATIVE DIAGNOSIS:  1. Rotator cuff tear right shoulder with impingement.  2. Degenerative joint disease acromioclavicular joint.   PROCEDURE:  1. Rotator cuff tear repair with acromioplasty.  2. Distal clavicle resection.   SURGEON:  Vonna Kotyk. Durward Fortes, M.D.   ASSISTANT:  Vonita Moss. Duffy, P.A.   ANESTHESIA:  General orotracheal.   COMPLICATIONS:  None.   HISTORY:  77 year old female fell approximately seven weeks ago and has been  having difficulty raising her arm over her head since that time.  She has  evidence of a biceps rupture and films reveal a decreased space between the  humeral head and the acromion.  She has had an MRI scan that reveals a very  large, full thickness rotator cuff tear with probable biceps tendon rupture.  She has moderate AC joint degenerative changes and a lateral down sloping  acromion with mild curvature of the anterior aspect of the acromion.  She is  now to have open rotator cuff tear repair.   PROCEDURE:  With the patient comfortable on the operating table and under  general endotracheal anesthesia, the patient was placed in a semi-sitting  position using the shoulder frame.  The right shoulder was then prepped with  DuraPrep from the base of the neck circumferentially below the elbows.  Sterile draping was performed.  A slightly oblique incision was made  beginning at the [facility] joint and closing over the anterior  aspect of the  shoulder approximately 2 to 2 1/2 inches in length. By sharp dissection, the  incision was carried down through the subcutaneous tissue.  The [facility] joint was  then incised along the skin incision.  The incision was then carried further  distally through the deltoid fascia.  Self-retaining retractors were  inserted.  Subperiosteal dissection was performed along the distal cm of  clavicle and the distal clavicle was removed.  Any remaining spurs were  removed with a rasp.  There was an obvious impingement beneath the acromion  and, accordingly, an anterior inferior acromioplasty was performed with the  oscillating saw.  Bone wax was applied to both the bleeding surface of the  distal clavicle as well as the acromion.  The rotator cuff was then  explored.  There was a tear of the supraspinatus and infraspinatus from the  anterior aspect of the humeral head extending in a V with an apex  superiorly  and posteriorly.  I was able to retrieve the edges after manual dissection  and then sharply debride them with a 15 blade knife.  A repair was performed  with 0 Tycron suture from the apex to the anterior aspect of the shoulder.  Bleeding bone was established over the anterior aspect of the shoulder.  It  had a very nice repair without a need for an anchor.  The wound was then  irrigated with saline solution.  The arm was placed through a range of  motion without evidence of impingement.  The deltoid fascia was closed with  a running 0 Vicryl, the subcu with 2-0 Vicryl, and the skin was closed with  skin clips.  A sterile, bulky dressing was applied followed by a sling.  The  patient tolerated the procedure well.   PLAN:  Recovery care, Percocet for pain, she did have an interscalene block  for postoperative pain control.  She will return to the office in seven  days.                                               Vonna Kotyk. Durward Fortes, M.D.    PWW/MEDQ  D:  02/08/2003  T:  02/08/2003   Job:  [id number]

## 2010-12-17 ENCOUNTER — Encounter (HOSPITAL_BASED_OUTPATIENT_CLINIC_OR_DEPARTMENT_OTHER): Payer: Medicare Other | Admitting: Hematology and Oncology

## 2010-12-17 DIAGNOSIS — E538 Deficiency of other specified B group vitamins: Secondary | ICD-10-CM

## 2011-01-14 ENCOUNTER — Encounter: Payer: Medicare Other | Admitting: Hematology and Oncology

## 2011-01-16 ENCOUNTER — Encounter (HOSPITAL_BASED_OUTPATIENT_CLINIC_OR_DEPARTMENT_OTHER): Payer: Medicare Other | Admitting: Hematology and Oncology

## 2011-01-16 DIAGNOSIS — E538 Deficiency of other specified B group vitamins: Secondary | ICD-10-CM

## 2011-02-11 ENCOUNTER — Encounter: Payer: Self-pay | Admitting: Internal Medicine

## 2011-02-11 ENCOUNTER — Encounter (HOSPITAL_BASED_OUTPATIENT_CLINIC_OR_DEPARTMENT_OTHER): Payer: Medicare Other | Admitting: Hematology and Oncology

## 2011-02-11 DIAGNOSIS — E538 Deficiency of other specified B group vitamins: Secondary | ICD-10-CM

## 2011-03-11 ENCOUNTER — Encounter (HOSPITAL_BASED_OUTPATIENT_CLINIC_OR_DEPARTMENT_OTHER): Payer: Medicare Other | Admitting: Hematology and Oncology

## 2011-03-11 DIAGNOSIS — E538 Deficiency of other specified B group vitamins: Secondary | ICD-10-CM

## 2011-04-08 ENCOUNTER — Other Ambulatory Visit: Payer: Self-pay | Admitting: Hematology and Oncology

## 2011-04-08 ENCOUNTER — Telehealth: Payer: Self-pay | Admitting: *Deleted

## 2011-04-08 ENCOUNTER — Encounter (HOSPITAL_BASED_OUTPATIENT_CLINIC_OR_DEPARTMENT_OTHER): Payer: Medicare Other | Admitting: Hematology and Oncology

## 2011-04-08 DIAGNOSIS — I959 Hypotension, unspecified: Secondary | ICD-10-CM

## 2011-04-08 DIAGNOSIS — E538 Deficiency of other specified B group vitamins: Secondary | ICD-10-CM

## 2011-04-08 DIAGNOSIS — D693 Immune thrombocytopenic purpura: Secondary | ICD-10-CM

## 2011-04-08 DIAGNOSIS — L989 Disorder of the skin and subcutaneous tissue, unspecified: Secondary | ICD-10-CM

## 2011-04-08 LAB — DIFFERENTIAL
Basophils Absolute: 0
Basophils Relative: 0
Eosinophils Absolute: 0.3
Eosinophils Relative: 4
Lymphocytes Relative: 19
Lymphs Abs: 1.4
Monocytes Absolute: 0.6
Monocytes Relative: 9
Neutro Abs: 5.1
Neutrophils Relative %: 68

## 2011-04-08 LAB — COMPREHENSIVE METABOLIC PANEL
BUN: 24 mg/dL — ABNORMAL HIGH (ref 6–23)
CO2: 27 mEq/L (ref 19–32)
Calcium: 9.9 mg/dL (ref 8.4–10.5)
Chloride: 102 mEq/L (ref 96–112)
Creatinine, Ser: 1.47 mg/dL — ABNORMAL HIGH (ref 0.50–1.10)

## 2011-04-08 LAB — CBC WITH DIFFERENTIAL/PLATELET
BASO%: 0.3 % (ref 0.0–2.0)
Basophils Absolute: 0 10e3/uL (ref 0.0–0.1)
EOS%: 4.1 % (ref 0.0–7.0)
Eosinophils Absolute: 0.2 10e3/uL (ref 0.0–0.5)
HCT: 36.7 % (ref 34.8–46.6)
HGB: 12.9 g/dL (ref 11.6–15.9)
LYMPH%: 17 % (ref 14.0–49.7)
MCH: 32.8 pg (ref 25.1–34.0)
MCHC: 35.2 g/dL (ref 31.5–36.0)
MCV: 93.2 fL (ref 79.5–101.0)
MONO#: 0.4 10e3/uL (ref 0.1–0.9)
MONO%: 6.8 % (ref 0.0–14.0)
NEUT#: 3.9 10e3/uL (ref 1.5–6.5)
NEUT%: 71.8 % (ref 38.4–76.8)
Platelets: 73 10e3/uL — ABNORMAL LOW (ref 145–400)
RBC: 3.94 10e6/uL (ref 3.70–5.45)
RDW: 13.9 % (ref 11.2–14.5)
WBC: 5.5 10e3/uL (ref 3.9–10.3)
lymph#: 0.9 10e3/uL (ref 0.9–3.3)

## 2011-04-08 LAB — CBC
HCT: 38.2
Hemoglobin: 13.3
MCHC: 34.8
MCV: 95
Platelets: 108 — ABNORMAL LOW
RBC: 4.02
RDW: 13.2
WBC: 7.5

## 2011-04-08 LAB — BONE MARROW EXAM

## 2011-04-08 LAB — VITAMIN B12: Vitamin B-12: 1121 pg/mL — ABNORMAL HIGH (ref 211–911)

## 2011-04-08 LAB — CHROMOSOME ANALYSIS, BONE MARROW

## 2011-04-08 NOTE — Telephone Encounter (Signed)
ROBIN HAYES NP  CALLED RE PT C/O FEELING FATIGUE  THIS AM B/P CHECKED AT OFFICE VISIT  AND WAS 88/59 PT HAD TAKEN BENAZEPRIL 20 MG AND CARVEDILOL 12 .5 MG THIS  AM  INFORMED  ROBIN IF PT  WANTS TO HOLD PM DOSES OF MEDS IF AFTER B/P CHECKED REMAINS LOW MAY  ALSO INSTRUCTED FOR PT TO MONITOR B/P OVER THE  NEXT FEW DAYS AND TO CALL OFFICE WITH READINGS  MAY NEED TO ADJUST MEDS WILL  FORWARD  TO DR Haroldine Laws FOR REVIEW./CY

## 2011-04-09 LAB — BASIC METABOLIC PANEL
Calcium: 8.7
GFR calc Af Amer: >60
GFR calc non Af Amer: >60
Glucose, Bld: 110 — ABNORMAL HIGH
Potassium: 3.2 — ABNORMAL LOW
Sodium: 140

## 2011-04-09 LAB — DIFFERENTIAL
Basophils Absolute: 0
Basophils Relative: 1
Eosinophils Absolute: 0.1
Eosinophils Relative: 1
Lymphocytes Relative: 7 — ABNORMAL LOW
Lymphs Abs: 0.6 — ABNORMAL LOW
Monocytes Absolute: 0.5
Monocytes Relative: 6
Neutro Abs: 7.6
Neutrophils Relative %: 86 — ABNORMAL HIGH

## 2011-04-09 LAB — CBC
Hemoglobin: 13.4
RBC: 4.02

## 2011-04-09 LAB — POCT CARDIAC MARKERS
Myoglobin, poc: 129
Operator id: 275321
Troponin i, poc: <0.05

## 2011-04-09 LAB — PROTIME-INR: Prothrombin Time: 14.1

## 2011-04-09 LAB — LIPID PANEL
Cholesterol: 112
LDL Cholesterol: 69
Total CHOL/HDL Ratio: 4.7

## 2011-04-09 LAB — POCT I-STAT, CHEM 8
Creatinine, Ser: 1
HCT: 38
Hemoglobin: 12.9
Potassium: 3.8
Sodium: 140
TCO2: 22

## 2011-04-09 LAB — MAGNESIUM: Magnesium: 2.1

## 2011-04-09 LAB — TROPONIN I: Troponin I: 0.01

## 2011-04-09 LAB — CK TOTAL AND CKMB (NOT AT ARMC)
CK, MB: 1.2
Total CK: 32

## 2011-04-09 LAB — TSH: TSH: 0.916

## 2011-04-13 NOTE — Telephone Encounter (Signed)
Can we bring her in to see scott for quick f/u on BP and meds? thanks

## 2011-04-15 NOTE — Telephone Encounter (Signed)
SPOKE WITH MS Postle  IS MONITORING B/P, HAS NOT BEEN  THAT LOW SINCE  THAT DAY OF MESSAGE . STATES FEELS FINE . INFORMED PT TO CONT TO MONITOR  B/P IF RUNS CONSISTENTLY LOW WILL  MAKE AN APPT WITH SCOTT WEAVER PAC  PER DR BENSIMHON PT VERBALIZED UNSDERSTANDING./CY

## 2011-05-04 ENCOUNTER — Other Ambulatory Visit: Payer: Self-pay | Admitting: Internal Medicine

## 2011-05-05 MED ORDER — CARVEDILOL 25 MG PO TABS: 25.0000 mg | ORAL_TABLET | Freq: Two times a day (BID) | ORAL | Status: DC

## 2011-05-06 ENCOUNTER — Encounter (HOSPITAL_BASED_OUTPATIENT_CLINIC_OR_DEPARTMENT_OTHER): Payer: Medicare Other | Admitting: Hematology and Oncology

## 2011-05-06 DIAGNOSIS — E538 Deficiency of other specified B group vitamins: Secondary | ICD-10-CM

## 2011-05-12 ENCOUNTER — Other Ambulatory Visit: Payer: Self-pay | Admitting: Internal Medicine

## 2011-05-12 MED ORDER — BENAZEPRIL HCL 20 MG PO TABS: 20.0000 mg | ORAL_TABLET | Freq: Every day | ORAL | Status: DC

## 2011-05-12 NOTE — Telephone Encounter (Signed)
Pt needs refill called into [facility]. Pt said she doesn't have any refills left. Please call in asap.

## 2011-05-15 ENCOUNTER — Other Ambulatory Visit: Payer: Self-pay | Admitting: *Deleted

## 2011-05-15 MED ORDER — BENAZEPRIL HCL 20 MG PO TABS: 20.0000 mg | ORAL_TABLET | Freq: Every day | ORAL | Status: DC

## 2011-05-21 ENCOUNTER — Ambulatory Visit (INDEPENDENT_AMBULATORY_CARE_PROVIDER_SITE_OTHER): Payer: Medicare Other | Admitting: Internal Medicine

## 2011-05-21 DIAGNOSIS — K148 Other diseases of tongue: Secondary | ICD-10-CM

## 2011-05-21 DIAGNOSIS — L920 Granuloma annulare: Secondary | ICD-10-CM

## 2011-05-21 DIAGNOSIS — L538 Other specified erythematous conditions: Secondary | ICD-10-CM

## 2011-05-21 NOTE — Progress Notes (Signed)
  Subjective:    Patient ID: [name], female    DOB: 30-Sep-1922, 77 y.o.   MRN: [id number]  HPI Oral lesion: Onset:6 months ago Trigger/injury:no , found incidentally @appt  for  dental cleaning Pain, redness swelling:no except slight tenderness with sharp food shards  Constitutional: Fever, chills, sweats, weight change:no Heme: Abnormal bruising or clotting, lymphadenopathy:no Treatment/response:Dentist referred her to Oral Surgeon , but she never went   Past medical history: Granuloma Annulare; she is being  seen at [place]     Objective:   Physical ExamGeneral appearance is of good health and nourishment; no acute distress or increased work of breathing is present.  No  lymphadenopathy about the head, neck, or axilla noted.   Eyes: No conjunctival inflammation or lid edema is present.   Ears:  External ear exam shows no significant lesions or deformities.  Otoscopic examination reveals clear canals, tympanic membranes are intact bilaterally without bulging, retraction, inflammation or discharge.  Nose:  External nasal examination shows no deformity or inflammation. Nasal mucosa are pink and moist without lesions or exudates. No septal dislocation .No obstruction to airflow.   Oral exam: Dental hygiene is good; lips and gums are healthy appearing.There is no oropharyngeal erythema or exudate noted. Upper & lower partials . There is a shallow 10 by 3 mm noninflamed deficit along the lateral aspect of the tongue on the left.  Neck:  No deformities, thyromegaly, masses, or tenderness noted.    Skin: Warm & dry w/o jaundice or tenting. Granuloma Annulare lesions of extremities         Assessment & Plan:  #1 lesion of the tongue present x6 months in the context of known granuloma annulare. She has been reticent to see the oral surgeon; I will ask her to see her dermatologist. The lesion appears bland  and apparently has not progressed  significantly in 6 months. She has no lymphadenopathy.

## 2011-05-21 NOTE — Patient Instructions (Signed)
.  Share results with Dr Sharol Roussel

## 2011-05-22 ENCOUNTER — Other Ambulatory Visit: Payer: Self-pay

## 2011-05-22 MED ORDER — BENAZEPRIL HCL 20 MG PO TABS: 20.0000 mg | ORAL_TABLET | Freq: Every day | ORAL | Status: DC

## 2011-05-22 NOTE — Telephone Encounter (Signed)
.   Requested Prescriptions   Pending Prescriptions Disp Refills  . benazepril (LOTENSIN) 20 MG tablet 90 tablet 4    Sig: Take 1 tablet (20 mg total) by mouth daily.

## 2011-05-26 ENCOUNTER — Other Ambulatory Visit: Payer: Self-pay | Admitting: Internal Medicine

## 2011-05-27 ENCOUNTER — Other Ambulatory Visit: Payer: Self-pay | Admitting: Internal Medicine

## 2011-05-27 NOTE — Telephone Encounter (Signed)
She should check with her pharmacist as to the cost; IF  it is not prohibitive I'll  be happy to prescribe it twice a day as needed to affected joint

## 2011-05-27 NOTE — Telephone Encounter (Signed)
Discuss with patient  

## 2011-05-27 NOTE — Telephone Encounter (Signed)
Spoke with patient, patient's grand-daughter had a rx for Voltaren gel and patient used on her joints and it really helped. Patient would like to know if Dr.Hopper would ok for her to have a rx for med.   Dr.Hopper please advise

## 2011-05-28 MED ORDER — DICLOFENAC SODIUM 1 % TD GEL: TRANSDERMAL | Status: DC

## 2011-05-28 NOTE — Telephone Encounter (Signed)
Patient called and left message on voicemail stating she is ok with paying for med, it will cost her 79 co-pay. RX sent to pharmacy

## 2011-05-28 NOTE — Telephone Encounter (Signed)
Addended by: Secundino Ginger on: 05/28/2011 01:28 PM   Modules accepted: Orders

## 2011-06-01 ENCOUNTER — Other Ambulatory Visit: Payer: Self-pay | Admitting: *Deleted

## 2011-06-01 ENCOUNTER — Other Ambulatory Visit: Payer: Self-pay | Admitting: Hematology and Oncology

## 2011-06-02 ENCOUNTER — Telehealth: Payer: Self-pay | Admitting: Hematology and Oncology

## 2011-06-02 NOTE — Telephone Encounter (Signed)
Returned pt's call and r/s 11/21 appt to 11/23 @ 3:45 pm. Ok to moved 11/21 inj to 11/23 per thu.

## 2011-06-03 ENCOUNTER — Ambulatory Visit: Payer: Medicare Other

## 2011-06-05 ENCOUNTER — Ambulatory Visit (HOSPITAL_BASED_OUTPATIENT_CLINIC_OR_DEPARTMENT_OTHER): Payer: Medicare Other

## 2011-06-05 VITALS — BP 175/85 | HR 62 | Temp 97.9°F

## 2011-06-05 DIAGNOSIS — E538 Deficiency of other specified B group vitamins: Secondary | ICD-10-CM

## 2011-06-05 DIAGNOSIS — D510 Vitamin B12 deficiency anemia due to intrinsic factor deficiency: Secondary | ICD-10-CM

## 2011-06-05 MED ORDER — CYANOCOBALAMIN 1000 MCG/ML IJ SOLN
1000.0000 ug | Freq: Once | INTRAMUSCULAR | Status: AC
  Administered 2011-06-05: 1000 ug via INTRAMUSCULAR

## 2011-06-11 ENCOUNTER — Ambulatory Visit: Payer: Medicare Other

## 2011-06-17 ENCOUNTER — Telehealth: Payer: Self-pay | Admitting: *Deleted

## 2011-06-17 NOTE — Telephone Encounter (Signed)
Pt called and stated she will be seeing her dermatologist on  07/02/11.    Dermatologist would like to know if pt could take  Dapsone  Since pt  Has problems with low platelets. Pt's    Phone    [phone].

## 2011-07-01 ENCOUNTER — Telehealth: Payer: Self-pay | Admitting: Hematology and Oncology

## 2011-07-01 ENCOUNTER — Ambulatory Visit (HOSPITAL_BASED_OUTPATIENT_CLINIC_OR_DEPARTMENT_OTHER): Payer: Medicare Other

## 2011-07-01 VITALS — BP 144/73 | HR 56 | Temp 98.5°F

## 2011-07-01 DIAGNOSIS — D510 Vitamin B12 deficiency anemia due to intrinsic factor deficiency: Secondary | ICD-10-CM

## 2011-07-01 MED ORDER — CYANOCOBALAMIN 1000 MCG/ML IJ SOLN
1000.0000 ug | Freq: Once | INTRAMUSCULAR | Status: AC
  Administered 2011-07-01: 1000 ug via INTRAMUSCULAR

## 2011-07-01 NOTE — Telephone Encounter (Signed)
Pt stopped by today after inj to check on 2013 appts. appts scheduled per mosaiq orders and pt given schedule for jan thru march.

## 2011-07-22 DIAGNOSIS — L538 Other specified erythematous conditions: Secondary | ICD-10-CM | POA: Diagnosis not present

## 2011-07-22 DIAGNOSIS — K141 Geographic tongue: Secondary | ICD-10-CM | POA: Diagnosis not present

## 2011-07-22 DIAGNOSIS — Z79899 Other long term (current) drug therapy: Secondary | ICD-10-CM | POA: Diagnosis not present

## 2011-07-22 DIAGNOSIS — B370 Candidal stomatitis: Secondary | ICD-10-CM | POA: Diagnosis not present

## 2011-07-29 ENCOUNTER — Ambulatory Visit (HOSPITAL_BASED_OUTPATIENT_CLINIC_OR_DEPARTMENT_OTHER): Payer: Medicare Other

## 2011-07-29 VITALS — BP 112/65 | HR 65 | Temp 98.7°F

## 2011-07-29 DIAGNOSIS — D510 Vitamin B12 deficiency anemia due to intrinsic factor deficiency: Secondary | ICD-10-CM | POA: Diagnosis not present

## 2011-07-29 MED ORDER — CYANOCOBALAMIN 1000 MCG/ML IJ SOLN
1000.0000 ug | Freq: Once | INTRAMUSCULAR | Status: AC
  Administered 2011-07-29: 1000 ug via INTRAMUSCULAR

## 2011-08-24 DIAGNOSIS — H353190 Nonexudative age-related macular degeneration, unspecified eye, stage unspecified: Secondary | ICD-10-CM | POA: Diagnosis not present

## 2011-08-24 DIAGNOSIS — Z961 Presence of intraocular lens: Secondary | ICD-10-CM | POA: Diagnosis not present

## 2011-08-24 DIAGNOSIS — Z947 Corneal transplant status: Secondary | ICD-10-CM | POA: Diagnosis not present

## 2011-08-24 DIAGNOSIS — H18519 Endothelial corneal dystrophy, unspecified eye: Secondary | ICD-10-CM | POA: Diagnosis not present

## 2011-08-26 ENCOUNTER — Ambulatory Visit (HOSPITAL_BASED_OUTPATIENT_CLINIC_OR_DEPARTMENT_OTHER): Payer: Medicare Other

## 2011-08-26 VITALS — BP 157/69 | HR 67 | Temp 98.9°F

## 2011-08-26 DIAGNOSIS — D510 Vitamin B12 deficiency anemia due to intrinsic factor deficiency: Secondary | ICD-10-CM | POA: Diagnosis not present

## 2011-08-26 MED ORDER — CYANOCOBALAMIN 1000 MCG/ML IJ SOLN
1000.0000 ug | Freq: Once | INTRAMUSCULAR | Status: AC
  Administered 2011-08-26: 1000 ug via INTRAMUSCULAR

## 2011-09-03 ENCOUNTER — Other Ambulatory Visit: Payer: Self-pay | Admitting: Internal Medicine

## 2011-09-07 DIAGNOSIS — B370 Candidal stomatitis: Secondary | ICD-10-CM | POA: Diagnosis not present

## 2011-09-07 DIAGNOSIS — L538 Other specified erythematous conditions: Secondary | ICD-10-CM | POA: Diagnosis not present

## 2011-09-07 DIAGNOSIS — K141 Geographic tongue: Secondary | ICD-10-CM | POA: Diagnosis not present

## 2011-09-07 DIAGNOSIS — Z79899 Other long term (current) drug therapy: Secondary | ICD-10-CM | POA: Diagnosis not present

## 2011-09-14 DIAGNOSIS — H17819 Minor opacity of cornea, unspecified eye: Secondary | ICD-10-CM | POA: Diagnosis not present

## 2011-09-14 DIAGNOSIS — H18519 Endothelial corneal dystrophy, unspecified eye: Secondary | ICD-10-CM | POA: Diagnosis not present

## 2011-09-23 ENCOUNTER — Ambulatory Visit (HOSPITAL_BASED_OUTPATIENT_CLINIC_OR_DEPARTMENT_OTHER): Payer: Medicare Other

## 2011-09-23 ENCOUNTER — Other Ambulatory Visit (HOSPITAL_BASED_OUTPATIENT_CLINIC_OR_DEPARTMENT_OTHER): Payer: Medicare Other | Admitting: Lab

## 2011-09-23 VITALS — BP 129/71 | HR 70 | Temp 98.0°F

## 2011-09-23 DIAGNOSIS — D510 Vitamin B12 deficiency anemia due to intrinsic factor deficiency: Secondary | ICD-10-CM | POA: Diagnosis not present

## 2011-09-23 DIAGNOSIS — L989 Disorder of the skin and subcutaneous tissue, unspecified: Secondary | ICD-10-CM | POA: Diagnosis not present

## 2011-09-23 DIAGNOSIS — E538 Deficiency of other specified B group vitamins: Secondary | ICD-10-CM

## 2011-09-23 LAB — COMPREHENSIVE METABOLIC PANEL
AST: 16 U/L (ref 0–37)
Albumin: 4 g/dL (ref 3.5–5.2)
Alkaline Phosphatase: 80 U/L (ref 39–117)
Calcium: 9.4 mg/dL (ref 8.4–10.5)
Chloride: 104 mEq/L (ref 96–112)
Glucose, Bld: 125 mg/dL — ABNORMAL HIGH (ref 70–99)
Potassium: 4.2 mEq/L (ref 3.5–5.3)
Sodium: 139 mEq/L (ref 135–145)
Total Protein: 5.9 g/dL — ABNORMAL LOW (ref 6.0–8.3)

## 2011-09-23 LAB — CBC WITH DIFFERENTIAL/PLATELET
Basophils Absolute: 0 10*3/uL (ref 0.0–0.1)
Eosinophils Absolute: 0 10*3/uL (ref 0.0–0.5)
HGB: 11.3 g/dL — ABNORMAL LOW (ref 11.6–15.9)
MCV: 100.1 fL (ref 79.5–101.0)
MONO%: 7.5 % (ref 0.0–14.0)
NEUT#: 3.4 10*3/uL (ref 1.5–6.5)
RBC: 3.27 10*6/uL — ABNORMAL LOW (ref 3.70–5.45)
RDW: 12.4 % (ref 11.2–14.5)
WBC: 5 10*3/uL (ref 3.9–10.3)
lymph#: 1.2 10*3/uL (ref 0.9–3.3)

## 2011-09-23 MED ORDER — CYANOCOBALAMIN 1000 MCG/ML IJ SOLN
1000.0000 ug | Freq: Once | INTRAMUSCULAR | Status: AC
  Administered 2011-09-23: 1000 ug via INTRAMUSCULAR

## 2011-09-25 ENCOUNTER — Ambulatory Visit (HOSPITAL_BASED_OUTPATIENT_CLINIC_OR_DEPARTMENT_OTHER): Payer: Medicare Other | Admitting: Hematology and Oncology

## 2011-09-25 ENCOUNTER — Encounter: Payer: Self-pay | Admitting: Hematology and Oncology

## 2011-09-25 ENCOUNTER — Telehealth: Payer: Self-pay | Admitting: Hematology and Oncology

## 2011-09-25 VITALS — BP 176/77 | HR 67 | Temp 97.0°F | Ht 63.5 in | Wt 184.0 lb

## 2011-09-25 DIAGNOSIS — D693 Immune thrombocytopenic purpura: Secondary | ICD-10-CM | POA: Diagnosis not present

## 2011-09-25 DIAGNOSIS — L538 Other specified erythematous conditions: Secondary | ICD-10-CM

## 2011-09-25 DIAGNOSIS — D649 Anemia, unspecified: Secondary | ICD-10-CM

## 2011-09-25 DIAGNOSIS — D539 Nutritional anemia, unspecified: Secondary | ICD-10-CM

## 2011-09-25 DIAGNOSIS — L920 Granuloma annulare: Secondary | ICD-10-CM | POA: Insufficient documentation

## 2011-09-25 NOTE — Progress Notes (Signed)
CC:   [name]. Daisy Darner, MD,FACP,FCCP Daisy Shelling, MD  IDENTIFYING STATEMENT:  The patient is an 78 year old woman with ITP who presents for followup.  INTERVAL HISTORY:  [name] is seen at [facility] for disseminated granuloma annulare.  She is status post Plaquenil therapy, G6PD.  She is now on dapsone and tacrolimus.  Tolerating therapy reasonably well.  Has had no bruising or bleeding in relation to her ITP.  Has good energy level.  Receives B12 every month.  MEDICATIONS:  Reviewed and updated.  ALLERGIES:  Carvedilol, streptomycin, penicillin and Norvasc.  PHYSICAL EXAMINATION:  General:  A well-appearing, well-nourished woman in no distress.  Vitals:  Pulse 67, blood pressure 136/77, temp 97, respirations 20, weight 184 pounds.  HEENT:  Head is atraumatic, normocephalic.  Sclerae anicteric.  Mouth moist.  Neck:  Supple. Abdomen:  Soft.  Skin:  Notes granuloma annulare lesions.  LAB DATA:  On 09/23/2011 white cell count 5, hemoglobin 11.3, hematocrit 32.7, platelets 93 (73), sodium 139, potassium 4.2, chloride 104, CO2 27, BUN 24, creatinine 1.44, glucose 125, T bilirubin 0.8, alkaline phosphatase 80, AST 16, ALT 9, calcium 9.4.  Vitamin B12 917.  IMPRESSION AND PLAN:  [name] is an 78 year old woman with ITP (idiopathic thrombocytopenic purpura).  She has never been treated. Platelets have improved to 93,000.  She has no bleeding.  She is on dapsone for disseminated granuloma annulare and her counts appear to be holding well.  She is also on tacrolimus and clotrimazole troches for geographic tongue managed by Dr. Sharol Roussel at [facility].  She continues on monthly vitamin B12 injections. I  will see her sooner rather than later and have her return in 4 months' time with labs.    ______________________________ Nira Retort, M.D. LIO/MEDQ  D:  09/25/2011  T:  09/25/2011  Job:  [id number]

## 2011-09-25 NOTE — Patient Instructions (Signed)
I have seen and examined the patient and agree with above.  [name]., M.D.  Patient to follow up as instructed.   Current Outpatient Prescriptions  Medication Sig Dispense Refill  . benazepril (LOTENSIN) 20 MG tablet Take 1 tablet (20 mg total) by mouth daily.  90 tablet  4  . beta carotene w/minerals (OCUVITE) tablet Take 1 tablet by mouth daily.        . brimonidine (ALPHAGAN) 0.2 % ophthalmic solution Place 1 drop into the right eye 2 (two) times daily.        . Calcium Carbonate-Vitamin D (CALTRATE 600+D) 600-400 MG-UNIT per tablet Take 2 tablets by mouth daily.        . carboxymethylcellulose (REFRESH TEARS) 0.5 % SOLN Place 1 drop into both eyes 2 (two) times daily.       . carvedilol (COREG) 25 MG tablet Take 12.5 mg by mouth 2 (two) times daily with a meal.      . clotrimazole (MYCELEX) 10 MG troche Take 10 mg by mouth every other day.      . cyanocobalamin (,VITAMIN B-12,) 1000 MCG/ML injection Inject 1,000 mcg into the muscle every 30 (thirty) days.        . dapsone 25 MG tablet Take 50 mg by mouth daily.      . diclofenac sodium (VOLTAREN) 1 % GEL as needed. Twice daily to joints      . fluticasone (FLONASE) 50 MCG/ACT nasal spray USE 1 SPRAY IN EACH NOSTRIL TWICE A DAY  16 g  3  . furosemide (LASIX) 40 MG tablet Take 40 mg by mouth daily as needed. PT TAKES 1/2 (HALF) TO 1 TABLET DAILY PRN       . gabapentin (NEURONTIN) 100 MG capsule Take 100 mg by mouth 3 (three) times daily. 1 CAP EVERY 8 HOURS PRN FOR LEG PAIN       . methocarbamol (ROBAXIN) 500 MG tablet Take by mouth.        . Misc Natural Products (OSTEO BI-FLEX ADV JOINT SHIELD) TABS Take 2 tablets by mouth daily.        Marland Kitchen omeprazole (PRILOSEC) 20 MG capsule Take 20 mg by mouth daily.        . tacrolimus (PROGRAF) 0.5 MG capsule Swish and spit 0.5 mg 2 (two) times daily.      . timolol (TIMOPTIC) 0.25 % ophthalmic solution Place 1 drop into the right eye 2 (two) times daily.              March 2013    Sunday Monday Tuesday Wednesday Thursday Friday Saturday                 1   2            3   4   5   6   7   8   9            10    11   12   13    LAB MO     2:00 PM  (15 min.)  [facility] [place] [facility]   INJECTION   2:30 PM  (15 min.)  [facility] Inj Nurse  [facility] MEDICAL ONCOLOG[facility] 14   15   EST PT 30   2:45 PM  (30 min.)  Daisy Retort, MD  [facility] MEDICAL ONCOLOGY 16       Cycle 1, Treatment  5     17   18   19  Happy Birthday!   20   21   22   23            24   25   26   27   28   29   30            31                                 Treatment Details  09/23/2011 - Cycle 1, Treatment 5     Nursing Communication: [place]: cyanocobalamin ((VITAMIN B-12))

## 2011-09-25 NOTE — Progress Notes (Signed)
This office note has been dictated.

## 2011-09-25 NOTE — Telephone Encounter (Signed)
Gave pt calendar for Injection every month until July with MD visit and labs

## 2011-10-10 DIAGNOSIS — J9600 Acute respiratory failure, unspecified whether with hypoxia or hypercapnia: Secondary | ICD-10-CM | POA: Diagnosis not present

## 2011-10-10 DIAGNOSIS — E872 Acidosis: Secondary | ICD-10-CM | POA: Diagnosis not present

## 2011-10-10 DIAGNOSIS — T1490XA Injury, unspecified, initial encounter: Secondary | ICD-10-CM | POA: Diagnosis not present

## 2011-10-10 DIAGNOSIS — J811 Chronic pulmonary edema: Secondary | ICD-10-CM | POA: Diagnosis not present

## 2011-10-10 DIAGNOSIS — Z90710 Acquired absence of both cervix and uterus: Secondary | ICD-10-CM | POA: Diagnosis not present

## 2011-10-10 DIAGNOSIS — R6889 Other general symptoms and signs: Secondary | ICD-10-CM | POA: Diagnosis not present

## 2011-10-10 DIAGNOSIS — Z4682 Encounter for fitting and adjustment of non-vascular catheter: Secondary | ICD-10-CM | POA: Diagnosis not present

## 2011-10-10 DIAGNOSIS — Z781 Physical restraint status: Secondary | ICD-10-CM | POA: Diagnosis not present

## 2011-10-10 DIAGNOSIS — R1310 Dysphagia, unspecified: Secondary | ICD-10-CM | POA: Diagnosis not present

## 2011-10-10 DIAGNOSIS — I495 Sick sinus syndrome: Secondary | ICD-10-CM | POA: Diagnosis not present

## 2011-10-10 DIAGNOSIS — N179 Acute kidney failure, unspecified: Secondary | ICD-10-CM | POA: Diagnosis not present

## 2011-10-10 DIAGNOSIS — Z043 Encounter for examination and observation following other accident: Secondary | ICD-10-CM | POA: Diagnosis not present

## 2011-10-10 DIAGNOSIS — R0609 Other forms of dyspnea: Secondary | ICD-10-CM | POA: Diagnosis not present

## 2011-10-10 DIAGNOSIS — R6521 Severe sepsis with septic shock: Secondary | ICD-10-CM | POA: Diagnosis not present

## 2011-10-10 DIAGNOSIS — I469 Cardiac arrest, cause unspecified: Secondary | ICD-10-CM | POA: Diagnosis not present

## 2011-10-10 DIAGNOSIS — Z5189 Encounter for other specified aftercare: Secondary | ICD-10-CM | POA: Diagnosis not present

## 2011-10-10 DIAGNOSIS — R633 Feeding difficulties: Secondary | ICD-10-CM | POA: Diagnosis not present

## 2011-10-10 DIAGNOSIS — S12100A Unspecified displaced fracture of second cervical vertebra, initial encounter for closed fracture: Secondary | ICD-10-CM | POA: Diagnosis not present

## 2011-10-10 DIAGNOSIS — T17908A Unspecified foreign body in respiratory tract, part unspecified causing other injury, initial encounter: Secondary | ICD-10-CM | POA: Diagnosis not present

## 2011-10-10 DIAGNOSIS — I252 Old myocardial infarction: Secondary | ICD-10-CM | POA: Diagnosis not present

## 2011-10-10 DIAGNOSIS — J984 Other disorders of lung: Secondary | ICD-10-CM | POA: Diagnosis not present

## 2011-10-10 DIAGNOSIS — M899 Disorder of bone, unspecified: Secondary | ICD-10-CM | POA: Diagnosis not present

## 2011-10-10 DIAGNOSIS — R918 Other nonspecific abnormal finding of lung field: Secondary | ICD-10-CM | POA: Diagnosis not present

## 2011-10-10 DIAGNOSIS — Z79899 Other long term (current) drug therapy: Secondary | ICD-10-CM | POA: Diagnosis not present

## 2011-10-10 DIAGNOSIS — S2249XA Multiple fractures of ribs, unspecified side, initial encounter for closed fracture: Secondary | ICD-10-CM | POA: Diagnosis not present

## 2011-10-10 DIAGNOSIS — I471 Supraventricular tachycardia: Secondary | ICD-10-CM | POA: Diagnosis not present

## 2011-10-10 DIAGNOSIS — I10 Essential (primary) hypertension: Secondary | ICD-10-CM | POA: Diagnosis not present

## 2011-10-10 DIAGNOSIS — S129XXA Fracture of neck, unspecified, initial encounter: Secondary | ICD-10-CM | POA: Diagnosis not present

## 2011-10-10 DIAGNOSIS — IMO0002 Reserved for concepts with insufficient information to code with codable children: Secondary | ICD-10-CM | POA: Diagnosis not present

## 2011-10-10 DIAGNOSIS — R4182 Altered mental status, unspecified: Secondary | ICD-10-CM | POA: Diagnosis not present

## 2011-10-10 DIAGNOSIS — J690 Pneumonitis due to inhalation of food and vomit: Secondary | ICD-10-CM | POA: Diagnosis not present

## 2011-10-10 DIAGNOSIS — S0990XA Unspecified injury of head, initial encounter: Secondary | ICD-10-CM | POA: Diagnosis not present

## 2011-10-10 DIAGNOSIS — Z9089 Acquired absence of other organs: Secondary | ICD-10-CM | POA: Diagnosis not present

## 2011-10-10 DIAGNOSIS — S12000A Unspecified displaced fracture of first cervical vertebra, initial encounter for closed fracture: Secondary | ICD-10-CM | POA: Diagnosis not present

## 2011-10-10 DIAGNOSIS — S22009A Unspecified fracture of unspecified thoracic vertebra, initial encounter for closed fracture: Secondary | ICD-10-CM | POA: Diagnosis not present

## 2011-10-10 DIAGNOSIS — I517 Cardiomegaly: Secondary | ICD-10-CM | POA: Diagnosis not present

## 2011-10-10 DIAGNOSIS — Z66 Do not resuscitate: Secondary | ICD-10-CM | POA: Diagnosis not present

## 2011-10-10 DIAGNOSIS — M949 Disorder of cartilage, unspecified: Secondary | ICD-10-CM | POA: Diagnosis not present

## 2011-10-10 DIAGNOSIS — S12600A Unspecified displaced fracture of seventh cervical vertebra, initial encounter for closed fracture: Secondary | ICD-10-CM | POA: Diagnosis not present

## 2011-10-10 DIAGNOSIS — I4510 Unspecified right bundle-branch block: Secondary | ICD-10-CM | POA: Diagnosis not present

## 2011-10-10 DIAGNOSIS — S0180XA Unspecified open wound of other part of head, initial encounter: Secondary | ICD-10-CM | POA: Diagnosis not present

## 2011-10-10 DIAGNOSIS — R319 Hematuria, unspecified: Secondary | ICD-10-CM | POA: Diagnosis not present

## 2011-10-10 DIAGNOSIS — R197 Diarrhea, unspecified: Secondary | ICD-10-CM | POA: Diagnosis not present

## 2011-10-10 DIAGNOSIS — I498 Other specified cardiac arrhythmias: Secondary | ICD-10-CM | POA: Diagnosis not present

## 2011-10-10 DIAGNOSIS — M1710 Unilateral primary osteoarthritis, unspecified knee: Secondary | ICD-10-CM | POA: Diagnosis not present

## 2011-10-10 DIAGNOSIS — S0993XA Unspecified injury of face, initial encounter: Secondary | ICD-10-CM | POA: Diagnosis not present

## 2011-10-10 DIAGNOSIS — R579 Shock, unspecified: Secondary | ICD-10-CM | POA: Diagnosis not present

## 2011-10-10 DIAGNOSIS — L538 Other specified erythematous conditions: Secondary | ICD-10-CM | POA: Diagnosis not present

## 2011-10-10 DIAGNOSIS — I4949 Other premature depolarization: Secondary | ICD-10-CM | POA: Diagnosis not present

## 2011-10-10 DIAGNOSIS — Z880 Allergy status to penicillin: Secondary | ICD-10-CM | POA: Diagnosis not present

## 2011-10-10 DIAGNOSIS — R000 Tachycardia, unspecified: Secondary | ICD-10-CM | POA: Diagnosis not present

## 2011-10-10 DIAGNOSIS — Z8674 Personal history of sudden cardiac arrest: Secondary | ICD-10-CM | POA: Diagnosis not present

## 2011-10-10 DIAGNOSIS — D649 Anemia, unspecified: Secondary | ICD-10-CM | POA: Diagnosis not present

## 2011-10-10 DIAGNOSIS — S2239XA Fracture of one rib, unspecified side, initial encounter for closed fracture: Secondary | ICD-10-CM | POA: Diagnosis not present

## 2011-10-10 DIAGNOSIS — T17208A Unspecified foreign body in pharynx causing other injury, initial encounter: Secondary | ICD-10-CM | POA: Diagnosis not present

## 2011-10-10 DIAGNOSIS — X58XXXA Exposure to other specified factors, initial encounter: Secondary | ICD-10-CM | POA: Diagnosis not present

## 2011-10-10 DIAGNOSIS — Z947 Corneal transplant status: Secondary | ICD-10-CM | POA: Diagnosis not present

## 2011-10-10 DIAGNOSIS — J90 Pleural effusion, not elsewhere classified: Secondary | ICD-10-CM | POA: Diagnosis not present

## 2011-10-10 DIAGNOSIS — R069 Unspecified abnormalities of breathing: Secondary | ICD-10-CM | POA: Diagnosis not present

## 2011-10-10 DIAGNOSIS — M4310 Spondylolisthesis, site unspecified: Secondary | ICD-10-CM | POA: Diagnosis not present

## 2011-10-10 DIAGNOSIS — Z9181 History of falling: Secondary | ICD-10-CM | POA: Diagnosis not present

## 2011-10-10 DIAGNOSIS — E440 Moderate protein-calorie malnutrition: Secondary | ICD-10-CM | POA: Diagnosis not present

## 2011-10-10 DIAGNOSIS — R609 Edema, unspecified: Secondary | ICD-10-CM | POA: Diagnosis not present

## 2011-10-10 DIAGNOSIS — E870 Hyperosmolality and hypernatremia: Secondary | ICD-10-CM | POA: Diagnosis not present

## 2011-10-10 DIAGNOSIS — M25469 Effusion, unspecified knee: Secondary | ICD-10-CM | POA: Diagnosis not present

## 2011-10-10 HISTORY — DX: Fracture of neck, unspecified, initial encounter: S12.9XXA

## 2011-10-20 ENCOUNTER — Ambulatory Visit: Payer: Medicare Other

## 2011-10-22 ENCOUNTER — Telehealth: Payer: Self-pay | Admitting: *Deleted

## 2011-10-22 NOTE — Telephone Encounter (Signed)
Received call from son Nai Magazine wanting to give md update on pt's status :   Pt fell at home 1 week ago on Saturday.   Pt  Broke 4 vertebrae in neck.   Pt is at [facility] now.  Pt had flat line x 2 ;  Pt is stable but in critical condiition.   Pt wanted to inform md. Barry's   Phone      [phone].

## 2011-11-06 DIAGNOSIS — D61818 Other pancytopenia: Secondary | ICD-10-CM | POA: Diagnosis not present

## 2011-11-06 DIAGNOSIS — IMO0002 Reserved for concepts with insufficient information to code with codable children: Secondary | ICD-10-CM | POA: Diagnosis not present

## 2011-11-06 DIAGNOSIS — E876 Hypokalemia: Secondary | ICD-10-CM | POA: Diagnosis not present

## 2011-11-06 DIAGNOSIS — E860 Dehydration: Secondary | ICD-10-CM | POA: Diagnosis not present

## 2011-11-06 DIAGNOSIS — I495 Sick sinus syndrome: Secondary | ICD-10-CM | POA: Diagnosis not present

## 2011-11-06 DIAGNOSIS — E538 Deficiency of other specified B group vitamins: Secondary | ICD-10-CM | POA: Diagnosis not present

## 2011-11-06 DIAGNOSIS — I10 Essential (primary) hypertension: Secondary | ICD-10-CM | POA: Diagnosis not present

## 2011-11-06 DIAGNOSIS — S12000A Unspecified displaced fracture of first cervical vertebra, initial encounter for closed fracture: Secondary | ICD-10-CM | POA: Diagnosis not present

## 2011-11-06 DIAGNOSIS — D62 Acute posthemorrhagic anemia: Secondary | ICD-10-CM | POA: Diagnosis not present

## 2011-11-06 DIAGNOSIS — S12100A Unspecified displaced fracture of second cervical vertebra, initial encounter for closed fracture: Secondary | ICD-10-CM | POA: Diagnosis not present

## 2011-11-06 DIAGNOSIS — M4000 Postural kyphosis, site unspecified: Secondary | ICD-10-CM | POA: Diagnosis not present

## 2011-11-06 DIAGNOSIS — I498 Other specified cardiac arrhythmias: Secondary | ICD-10-CM | POA: Diagnosis not present

## 2011-11-06 DIAGNOSIS — S129XXA Fracture of neck, unspecified, initial encounter: Secondary | ICD-10-CM | POA: Diagnosis not present

## 2011-11-06 DIAGNOSIS — J189 Pneumonia, unspecified organism: Secondary | ICD-10-CM | POA: Diagnosis not present

## 2011-11-06 DIAGNOSIS — M5030 Other cervical disc degeneration, unspecified cervical region: Secondary | ICD-10-CM | POA: Diagnosis not present

## 2011-11-06 DIAGNOSIS — I469 Cardiac arrest, cause unspecified: Secondary | ICD-10-CM | POA: Diagnosis not present

## 2011-11-06 DIAGNOSIS — L538 Other specified erythematous conditions: Secondary | ICD-10-CM | POA: Diagnosis not present

## 2011-11-06 DIAGNOSIS — R110 Nausea: Secondary | ICD-10-CM | POA: Diagnosis not present

## 2011-11-06 DIAGNOSIS — K5900 Constipation, unspecified: Secondary | ICD-10-CM | POA: Diagnosis not present

## 2011-11-06 DIAGNOSIS — I4891 Unspecified atrial fibrillation: Secondary | ICD-10-CM | POA: Diagnosis not present

## 2011-11-06 DIAGNOSIS — Z5189 Encounter for other specified aftercare: Secondary | ICD-10-CM | POA: Diagnosis not present

## 2011-11-06 DIAGNOSIS — R6889 Other general symptoms and signs: Secondary | ICD-10-CM | POA: Diagnosis not present

## 2011-11-06 DIAGNOSIS — R609 Edema, unspecified: Secondary | ICD-10-CM | POA: Diagnosis not present

## 2011-11-06 DIAGNOSIS — Z9181 History of falling: Secondary | ICD-10-CM | POA: Diagnosis not present

## 2011-11-06 DIAGNOSIS — J690 Pneumonitis due to inhalation of food and vomit: Secondary | ICD-10-CM | POA: Diagnosis not present

## 2011-11-06 DIAGNOSIS — Z8674 Personal history of sudden cardiac arrest: Secondary | ICD-10-CM | POA: Diagnosis not present

## 2011-11-06 DIAGNOSIS — IMO0001 Reserved for inherently not codable concepts without codable children: Secondary | ICD-10-CM | POA: Diagnosis not present

## 2011-11-06 DIAGNOSIS — S12600A Unspecified displaced fracture of seventh cervical vertebra, initial encounter for closed fracture: Secondary | ICD-10-CM | POA: Diagnosis not present

## 2011-11-06 DIAGNOSIS — R059 Cough, unspecified: Secondary | ICD-10-CM | POA: Diagnosis not present

## 2011-11-09 DIAGNOSIS — R609 Edema, unspecified: Secondary | ICD-10-CM | POA: Diagnosis not present

## 2011-11-09 DIAGNOSIS — L538 Other specified erythematous conditions: Secondary | ICD-10-CM | POA: Diagnosis not present

## 2011-11-09 DIAGNOSIS — I10 Essential (primary) hypertension: Secondary | ICD-10-CM | POA: Diagnosis not present

## 2011-11-09 DIAGNOSIS — I4891 Unspecified atrial fibrillation: Secondary | ICD-10-CM | POA: Diagnosis not present

## 2011-11-09 DIAGNOSIS — S129XXA Fracture of neck, unspecified, initial encounter: Secondary | ICD-10-CM | POA: Diagnosis not present

## 2011-11-12 DIAGNOSIS — E876 Hypokalemia: Secondary | ICD-10-CM | POA: Diagnosis not present

## 2011-11-16 DIAGNOSIS — R609 Edema, unspecified: Secondary | ICD-10-CM | POA: Diagnosis not present

## 2011-11-16 DIAGNOSIS — R05 Cough: Secondary | ICD-10-CM | POA: Diagnosis not present

## 2011-11-19 DIAGNOSIS — K5900 Constipation, unspecified: Secondary | ICD-10-CM | POA: Diagnosis not present

## 2011-11-23 DIAGNOSIS — E876 Hypokalemia: Secondary | ICD-10-CM | POA: Diagnosis not present

## 2011-11-24 ENCOUNTER — Ambulatory Visit: Payer: Medicare Other

## 2011-11-26 DIAGNOSIS — R110 Nausea: Secondary | ICD-10-CM | POA: Diagnosis not present

## 2011-11-27 DIAGNOSIS — E860 Dehydration: Secondary | ICD-10-CM | POA: Diagnosis not present

## 2011-11-28 DIAGNOSIS — R05 Cough: Secondary | ICD-10-CM | POA: Diagnosis not present

## 2011-11-30 DIAGNOSIS — R110 Nausea: Secondary | ICD-10-CM | POA: Diagnosis not present

## 2011-11-30 DIAGNOSIS — R05 Cough: Secondary | ICD-10-CM | POA: Diagnosis not present

## 2011-11-30 DIAGNOSIS — J189 Pneumonia, unspecified organism: Secondary | ICD-10-CM | POA: Diagnosis not present

## 2011-12-02 DIAGNOSIS — S12000A Unspecified displaced fracture of first cervical vertebra, initial encounter for closed fracture: Secondary | ICD-10-CM | POA: Diagnosis not present

## 2011-12-02 DIAGNOSIS — S12600A Unspecified displaced fracture of seventh cervical vertebra, initial encounter for closed fracture: Secondary | ICD-10-CM | POA: Diagnosis not present

## 2011-12-02 DIAGNOSIS — S12100A Unspecified displaced fracture of second cervical vertebra, initial encounter for closed fracture: Secondary | ICD-10-CM | POA: Diagnosis not present

## 2011-12-12 DIAGNOSIS — Z5189 Encounter for other specified aftercare: Secondary | ICD-10-CM | POA: Diagnosis not present

## 2011-12-12 DIAGNOSIS — S129XXA Fracture of neck, unspecified, initial encounter: Secondary | ICD-10-CM | POA: Diagnosis not present

## 2011-12-12 DIAGNOSIS — I469 Cardiac arrest, cause unspecified: Secondary | ICD-10-CM | POA: Diagnosis not present

## 2011-12-12 DIAGNOSIS — E538 Deficiency of other specified B group vitamins: Secondary | ICD-10-CM | POA: Diagnosis not present

## 2011-12-12 DIAGNOSIS — Z9181 History of falling: Secondary | ICD-10-CM | POA: Diagnosis not present

## 2011-12-12 DIAGNOSIS — I10 Essential (primary) hypertension: Secondary | ICD-10-CM | POA: Diagnosis not present

## 2011-12-12 DIAGNOSIS — L538 Other specified erythematous conditions: Secondary | ICD-10-CM | POA: Diagnosis not present

## 2011-12-12 DIAGNOSIS — I495 Sick sinus syndrome: Secondary | ICD-10-CM | POA: Diagnosis not present

## 2011-12-12 DIAGNOSIS — J690 Pneumonitis due to inhalation of food and vomit: Secondary | ICD-10-CM | POA: Diagnosis not present

## 2011-12-17 DIAGNOSIS — I10 Essential (primary) hypertension: Secondary | ICD-10-CM | POA: Diagnosis not present

## 2011-12-17 DIAGNOSIS — S129XXA Fracture of neck, unspecified, initial encounter: Secondary | ICD-10-CM | POA: Diagnosis not present

## 2011-12-17 DIAGNOSIS — L538 Other specified erythematous conditions: Secondary | ICD-10-CM | POA: Diagnosis not present

## 2011-12-17 DIAGNOSIS — E538 Deficiency of other specified B group vitamins: Secondary | ICD-10-CM | POA: Diagnosis not present

## 2011-12-22 ENCOUNTER — Ambulatory Visit (INDEPENDENT_AMBULATORY_CARE_PROVIDER_SITE_OTHER): Payer: Medicare Other | Admitting: Internal Medicine

## 2011-12-22 ENCOUNTER — Emergency Department (HOSPITAL_COMMUNITY): Payer: Medicare Other

## 2011-12-22 ENCOUNTER — Inpatient Hospital Stay (HOSPITAL_COMMUNITY)
Admission: EM | Admit: 2011-12-22 | Discharge: 2011-12-28 | DRG: 372 | Disposition: A | Discharge status: Home Health | Payer: Medicare Other | Attending: Internal Medicine | Admitting: Internal Medicine

## 2011-12-22 ENCOUNTER — Encounter (HOSPITAL_COMMUNITY): Payer: Self-pay | Admitting: *Deleted

## 2011-12-22 ENCOUNTER — Telehealth: Payer: Self-pay | Admitting: *Deleted

## 2011-12-22 ENCOUNTER — Ambulatory Visit: Payer: Medicare Other

## 2011-12-22 VITALS — BP 160/92 | HR 135 | Temp 100.1°F | Wt 169.0 lb

## 2011-12-22 DIAGNOSIS — R112 Nausea with vomiting, unspecified: Secondary | ICD-10-CM | POA: Diagnosis not present

## 2011-12-22 DIAGNOSIS — D510 Vitamin B12 deficiency anemia due to intrinsic factor deficiency: Secondary | ICD-10-CM

## 2011-12-22 DIAGNOSIS — R109 Unspecified abdominal pain: Secondary | ICD-10-CM | POA: Diagnosis not present

## 2011-12-22 DIAGNOSIS — I129 Hypertensive chronic kidney disease with stage 1 through stage 4 chronic kidney disease, or unspecified chronic kidney disease: Secondary | ICD-10-CM | POA: Diagnosis present

## 2011-12-22 DIAGNOSIS — M810 Age-related osteoporosis without current pathological fracture: Secondary | ICD-10-CM

## 2011-12-22 DIAGNOSIS — R000 Tachycardia, unspecified: Secondary | ICD-10-CM | POA: Diagnosis not present

## 2011-12-22 DIAGNOSIS — T148XXA Other injury of unspecified body region, initial encounter: Secondary | ICD-10-CM

## 2011-12-22 DIAGNOSIS — R609 Edema, unspecified: Secondary | ICD-10-CM

## 2011-12-22 DIAGNOSIS — I471 Supraventricular tachycardia, unspecified: Secondary | ICD-10-CM | POA: Diagnosis not present

## 2011-12-22 DIAGNOSIS — T07XXXA Unspecified multiple injuries, initial encounter: Secondary | ICD-10-CM

## 2011-12-22 DIAGNOSIS — D72829 Elevated white blood cell count, unspecified: Secondary | ICD-10-CM | POA: Diagnosis present

## 2011-12-22 DIAGNOSIS — R32 Unspecified urinary incontinence: Secondary | ICD-10-CM

## 2011-12-22 DIAGNOSIS — M2550 Pain in unspecified joint: Secondary | ICD-10-CM

## 2011-12-22 DIAGNOSIS — Z66 Do not resuscitate: Secondary | ICD-10-CM | POA: Diagnosis present

## 2011-12-22 DIAGNOSIS — N189 Chronic kidney disease, unspecified: Secondary | ICD-10-CM | POA: Diagnosis present

## 2011-12-22 DIAGNOSIS — K529 Noninfective gastroenteritis and colitis, unspecified: Secondary | ICD-10-CM

## 2011-12-22 DIAGNOSIS — A419 Sepsis, unspecified organism: Secondary | ICD-10-CM | POA: Diagnosis not present

## 2011-12-22 DIAGNOSIS — D693 Immune thrombocytopenic purpura: Secondary | ICD-10-CM

## 2011-12-22 DIAGNOSIS — N259 Disorder resulting from impaired renal tubular function, unspecified: Secondary | ICD-10-CM

## 2011-12-22 DIAGNOSIS — R110 Nausea: Secondary | ICD-10-CM

## 2011-12-22 DIAGNOSIS — I5181 Takotsubo syndrome: Secondary | ICD-10-CM

## 2011-12-22 DIAGNOSIS — Z8679 Personal history of other diseases of the circulatory system: Secondary | ICD-10-CM

## 2011-12-22 DIAGNOSIS — K449 Diaphragmatic hernia without obstruction or gangrene: Secondary | ICD-10-CM | POA: Diagnosis not present

## 2011-12-22 DIAGNOSIS — A0472 Enterocolitis due to Clostridium difficile, not specified as recurrent: Principal | ICD-10-CM | POA: Diagnosis present

## 2011-12-22 DIAGNOSIS — I10 Essential (primary) hypertension: Secondary | ICD-10-CM

## 2011-12-22 DIAGNOSIS — J9600 Acute respiratory failure, unspecified whether with hypoxia or hypercapnia: Secondary | ICD-10-CM | POA: Diagnosis not present

## 2011-12-22 DIAGNOSIS — M542 Cervicalgia: Secondary | ICD-10-CM

## 2011-12-22 DIAGNOSIS — I428 Other cardiomyopathies: Secondary | ICD-10-CM

## 2011-12-22 DIAGNOSIS — N179 Acute kidney failure, unspecified: Secondary | ICD-10-CM | POA: Diagnosis not present

## 2011-12-22 DIAGNOSIS — M79609 Pain in unspecified limb: Secondary | ICD-10-CM

## 2011-12-22 DIAGNOSIS — L538 Other specified erythematous conditions: Secondary | ICD-10-CM

## 2011-12-22 DIAGNOSIS — E871 Hypo-osmolality and hyponatremia: Secondary | ICD-10-CM | POA: Diagnosis not present

## 2011-12-22 DIAGNOSIS — I252 Old myocardial infarction: Secondary | ICD-10-CM | POA: Diagnosis not present

## 2011-12-22 DIAGNOSIS — I809 Phlebitis and thrombophlebitis of unspecified site: Secondary | ICD-10-CM

## 2011-12-22 DIAGNOSIS — E876 Hypokalemia: Secondary | ICD-10-CM | POA: Diagnosis present

## 2011-12-22 DIAGNOSIS — R1084 Generalized abdominal pain: Secondary | ICD-10-CM | POA: Diagnosis not present

## 2011-12-22 DIAGNOSIS — D631 Anemia in chronic kidney disease: Secondary | ICD-10-CM | POA: Diagnosis present

## 2011-12-22 DIAGNOSIS — J9690 Respiratory failure, unspecified, unspecified whether with hypoxia or hypercapnia: Secondary | ICD-10-CM

## 2011-12-22 DIAGNOSIS — M7120 Synovial cyst of popliteal space [Baker], unspecified knee: Secondary | ICD-10-CM

## 2011-12-22 DIAGNOSIS — N039 Chronic nephritic syndrome with unspecified morphologic changes: Secondary | ICD-10-CM | POA: Diagnosis present

## 2011-12-22 DIAGNOSIS — K5289 Other specified noninfective gastroenteritis and colitis: Secondary | ICD-10-CM | POA: Diagnosis not present

## 2011-12-22 DIAGNOSIS — R5381 Other malaise: Secondary | ICD-10-CM

## 2011-12-22 DIAGNOSIS — R197 Diarrhea, unspecified: Secondary | ICD-10-CM | POA: Diagnosis not present

## 2011-12-22 DIAGNOSIS — Z8719 Personal history of other diseases of the digestive system: Secondary | ICD-10-CM

## 2011-12-22 DIAGNOSIS — R55 Syncope and collapse: Secondary | ICD-10-CM

## 2011-12-22 DIAGNOSIS — K2990 Gastroduodenitis, unspecified, without bleeding: Secondary | ICD-10-CM | POA: Diagnosis not present

## 2011-12-22 DIAGNOSIS — D696 Thrombocytopenia, unspecified: Secondary | ICD-10-CM

## 2011-12-22 DIAGNOSIS — S298XXA Other specified injuries of thorax, initial encounter: Secondary | ICD-10-CM

## 2011-12-22 DIAGNOSIS — Z86010 Personal history of colonic polyps: Secondary | ICD-10-CM

## 2011-12-22 DIAGNOSIS — L920 Granuloma annulare: Secondary | ICD-10-CM

## 2011-12-22 HISTORY — DX: Fracture of neck, unspecified, initial encounter: S12.9XXA

## 2011-12-22 HISTORY — DX: Other complications of anesthesia, initial encounter: T88.59XA

## 2011-12-22 HISTORY — DX: Adverse effect of unspecified anesthetic, initial encounter: T41.45XA

## 2011-12-22 LAB — BASIC METABOLIC PANEL
BUN: 17 mg/dL (ref 6–23)
CO2: 22 mEq/L (ref 19–32)
Calcium: 8.2 mg/dL — ABNORMAL LOW (ref 8.4–10.5)
Chloride: 104 mEq/L (ref 96–112)
Creatinine, Ser: 0.93 mg/dL (ref 0.50–1.10)
GFR calc Af Amer: 62 mL/min — ABNORMAL LOW (ref >90)
GFR calc non Af Amer: 54 mL/min — ABNORMAL LOW (ref >90)
Glucose, Bld: 105 mg/dL — ABNORMAL HIGH (ref 70–99)
Potassium: 2.7 mEq/L — CL (ref 3.5–5.1)
Sodium: 139 mEq/L (ref 135–145)

## 2011-12-22 LAB — URINALYSIS, ROUTINE W REFLEX MICROSCOPIC
Glucose, UA: NEGATIVE mg/dL
Hgb urine dipstick: NEGATIVE
Ketones, ur: 40 mg/dL — AB
Nitrite: NEGATIVE
Protein, ur: 30 mg/dL — AB
Specific Gravity, Urine: 1.023 (ref 1.005–1.030)
Urobilinogen, UA: 0.2 mg/dL (ref 0.0–1.0)
pH: 6 (ref 5.0–8.0)

## 2011-12-22 LAB — CBC
HCT: 30.6 % — ABNORMAL LOW (ref 36.0–46.0)
Hemoglobin: 10.5 g/dL — ABNORMAL LOW (ref 12.0–15.0)
MCH: 33 pg (ref 26.0–34.0)
MCHC: 34.3 g/dL (ref 30.0–36.0)
MCV: 96.2 fL (ref 78.0–100.0)
Platelets: 107 10*3/uL — ABNORMAL LOW (ref 150–400)
RBC: 3.18 MIL/uL — ABNORMAL LOW (ref 3.87–5.11)
RDW: 13.8 % (ref 11.5–15.5)
WBC: 14 10*3/uL — ABNORMAL HIGH (ref 4.0–10.5)

## 2011-12-22 LAB — URINE MICROSCOPIC-ADD ON

## 2011-12-22 LAB — TROPONIN I: Troponin I: <0.3 ng/mL (ref <0.30)

## 2011-12-22 MED ORDER — POTASSIUM CHLORIDE 10 MEQ/100ML IV SOLN
10.0000 meq | Freq: Once | INTRAVENOUS | Status: AC
  Administered 2011-12-22: 10 meq via INTRAVENOUS
  Filled 2011-12-22: qty 100

## 2011-12-22 MED ORDER — ONDANSETRON HCL 4 MG/2ML IJ SOLN
4.0000 mg | Freq: Three times a day (TID) | INTRAMUSCULAR | Status: AC | PRN
  Administered 2011-12-23: 4 mg via INTRAVENOUS
  Filled 2011-12-22: qty 2

## 2011-12-22 MED ORDER — POTASSIUM CHLORIDE CRYS ER 20 MEQ PO TBCR
60.0000 meq | EXTENDED_RELEASE_TABLET | Freq: Once | ORAL | Status: DC
  Filled 2011-12-22: qty 3

## 2011-12-22 MED ORDER — ACETAMINOPHEN 325 MG PO TABS
650.0000 mg | ORAL_TABLET | Freq: Once | ORAL | Status: DC
  Filled 2011-12-22: qty 2

## 2011-12-22 MED ORDER — CIPROFLOXACIN IN D5W 400 MG/200ML IV SOLN
400.0000 mg | Freq: Once | INTRAVENOUS | Status: AC
  Administered 2011-12-22: 400 mg via INTRAVENOUS
  Filled 2011-12-22: qty 200

## 2011-12-22 MED ORDER — ONDANSETRON HCL 4 MG/2ML IJ SOLN
INTRAMUSCULAR | Status: AC
  Administered 2011-12-22: 15:00:00
  Filled 2011-12-22: qty 2

## 2011-12-22 MED ORDER — SODIUM CHLORIDE 0.9 % IV BOLUS (SEPSIS): 1000.0000 mL | Freq: Once | INTRAVENOUS | Status: DC

## 2011-12-22 MED ORDER — SODIUM CHLORIDE 0.9 % IV SOLN
INTRAVENOUS | Status: AC
  Administered 2011-12-23: via INTRAVENOUS

## 2011-12-22 MED ORDER — METOPROLOL TARTRATE 1 MG/ML IV SOLN
2.5000 mg | Freq: Once | INTRAVENOUS | Status: AC
  Administered 2011-12-23: 2.5 mg via INTRAVENOUS
  Filled 2011-12-22: qty 5

## 2011-12-22 MED ORDER — POTASSIUM CHLORIDE 10 MEQ/100ML IV SOLN
10.0000 meq | Freq: Once | INTRAVENOUS | Status: AC
  Administered 2011-12-23: 10 meq via INTRAVENOUS

## 2011-12-22 MED ORDER — IOHEXOL 300 MG/ML  SOLN
100.0000 mL | Freq: Once | INTRAMUSCULAR | Status: AC | PRN
  Administered 2011-12-22: 100 mL via INTRAVENOUS

## 2011-12-22 MED ORDER — CIPROFLOXACIN IN D5W 400 MG/200ML IV SOLN
400.0000 mg | Freq: Two times a day (BID) | INTRAVENOUS | Status: DC
  Administered 2011-12-23 – 2011-12-25 (×5): 400 mg via INTRAVENOUS
  Filled 2011-12-22 (×7): qty 200

## 2011-12-22 MED ORDER — METRONIDAZOLE IN NACL 5-0.79 MG/ML-% IV SOLN
500.0000 mg | Freq: Once | INTRAVENOUS | Status: AC
  Administered 2011-12-22: 500 mg via INTRAVENOUS
  Filled 2011-12-22: qty 100

## 2011-12-22 MED ORDER — SODIUM CHLORIDE 0.9 % IV BOLUS (SEPSIS)
1000.0000 mL | Freq: Once | INTRAVENOUS | Status: AC
  Administered 2011-12-22: 1000 mL via INTRAVENOUS

## 2011-12-22 MED ORDER — METRONIDAZOLE IN NACL 5-0.79 MG/ML-% IV SOLN
500.0000 mg | Freq: Three times a day (TID) | INTRAVENOUS | Status: DC
  Administered 2011-12-23 – 2011-12-28 (×16): 500 mg via INTRAVENOUS
  Filled 2011-12-22 (×18): qty 100

## 2011-12-22 MED ORDER — ONDANSETRON HCL 4 MG/2ML IJ SOLN
INTRAMUSCULAR | Status: AC
  Administered 2011-12-22: 18:00:00
  Filled 2011-12-22: qty 2

## 2011-12-22 MED ORDER — ONDANSETRON HCL 4 MG/2ML IJ SOLN
INTRAMUSCULAR | Status: AC
  Administered 2011-12-22: 4 mg
  Filled 2011-12-22: qty 2

## 2011-12-22 NOTE — ED Notes (Signed)
Attempted to get second line started for antibiotic administration.  No success.  IV nurse paged.

## 2011-12-22 NOTE — ED Notes (Signed)
Attempted to call report to floor. Nurse unavailable to take report.

## 2011-12-22 NOTE — ED Notes (Signed)
[id number] Expected date:<BR> Expected time: 2:47 PM<BR> Means of arrival:<BR> Comments:<BR> M35 - Abd pain, NVD, tachycardic 130

## 2011-12-22 NOTE — ED Provider Notes (Signed)
History    78yF with n/v/d and abdominal pain. Has been going for days but worsening. Seen by PCP and referred to ED because of tachycardia. Pt denies CP or SOB. NBNB emesis and no blood in stool. Pain is diffuse and crampy and somewhat worse on L side. Constant. No appreciable exacerbating or relieving factors. No urinary complaints. Pt with recent admission to [facility] and after fall and sustained cervical fx which apparently being treated conservatively. She reports coding during hospitalization and having rib fxs from CPR. Discharged to rehab and just returned home a couple days ago. Was tx'd for aspiration pneumonia in hospital but has not been on abx in several weeks.   CSN: [id number]  Arrival date & time 12/22/11  1459   First MD Initiated Contact with Patient 12/22/11 1613      Chief Complaint  Patient presents with  . Tachycardia    (Consider location/radiation/quality/duration/timing/severity/associated sxs/prior treatment) HPI  Past Medical History  Diagnosis Date  . 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 Resolved. --dx'd on cath 2005. EF 29% with minimal distal CAD (small OM1 70-80).  Echo 10/09 EF 55%  . Hypertension     SEVERE  . Idiopathic thrombocytopenic purpura (ITP)     DR. Jamse Arn  . Granuloma annulare   . History of phlebitis   . Lower extremity edema   . Osteoporosis   . Colitis, ischemic   . Fall     with non-healing rib fractures  . Lumbar stenosis 2004    DR. MARK ROY  . AV block, 1st degree   . Edema   . Complication of anesthesia     hard to wake up  . MI (myocardial infarction)     Non ST elevation 2005 per pt  . Compression fracture of C-spine 10/10/2011    [place] [address] at home, tx at [facility]    Past Surgical History  Procedure Date  . Cataract extraction, bilateral   . Colonoscopy w/ polypectomy   . Abdominal hysterectomy   . Rotator cuff repair   . Corneal transplant 11-27-02    left eye  . Cardiac catheterization     12/2003   . Back surgery 10/2004    LS Disc 4-5 no sx.  . Colonoscopy 09/2005    adenoma  . Rectal bleed 05-01-06    colonoscopy-ischemic colitis  . Appendectomy   . Vertebroplasty     Family History  Problem Relation Age of Onset  . Coronary artery disease Mother     History  Substance Use Topics  . Smoking status: Never Smoker   . Smokeless tobacco: Never Used  . Alcohol Use: No    OB History    Grav Para Term Preterm Abortions TAB SAB Ect Mult Living                  Review of Systems   Review of symptoms negative unless otherwise noted in HPI.   Allergies  Penicillins; Alendronate sodium; Colchicine; Streptomycin; and Morphine and related  Home Medications   Current Outpatient Rx  Name Route Sig Dispense Refill  . BENAZEPRIL HCL 20 MG PO TABS Oral Take 20 mg by mouth daily.    . OCUVITE PO TABS Oral Take 1 tablet by mouth daily.      Marland Kitchen BRIMONIDINE TARTRATE 0.2 % OP SOLN Right Eye Place 1 drop into the right eye 2 (two) times daily.      Marland Kitchen CARBOXYMETHYLCELLULOSE SODIUM 0.5 % OP SOLN Both Eyes Place 1  drop into both eyes 2 (two) times daily.     Marland Kitchen CARVEDILOL 25 MG PO TABS Oral Take 25 mg by mouth 2 (two) times daily with a meal.    . FLUTICASONE PROPIONATE 50 MCG/ACT NA SUSP  USE 1 SPRAY IN EACH NOSTRIL TWICE A DAY 16 g 3  . FUROSEMIDE 40 MG PO TABS Oral Take 40 mg by mouth daily as needed. PT TAKES 1/2 (HALF) TO 1 TABLET DAILY PRN for edema    . OSTEO BI-FLEX ADV JOINT SHIELD PO TABS Oral Take 2 tablets by mouth daily.      Marland Kitchen OMEPRAZOLE 20 MG PO CPDR Oral Take 20 mg by mouth daily.      Marland Kitchen TIMOLOL MALEATE 0.25 % OP SOLN Right Eye Place 1 drop into the right eye 2 (two) times daily.        BP 144/84  Pulse 136  Temp(Src) 100.8 F (38.2 C) (Rectal)  Resp 28  Wt 160 lb (72.576 kg)  SpO2 97%  Physical Exam  Nursing note and vitals reviewed. Constitutional: She appears well-developed and well-nourished. No distress.  HENT:  Head: Normocephalic and atraumatic.    Eyes: Conjunctivae are normal. Right eye exhibits no discharge. Left eye exhibits no discharge.  Neck: Neck supple.  Cardiovascular: Regular rhythm and normal heart sounds.  Exam reveals no gallop and no friction rub.   No murmur heard.      Tachycardic  Pulmonary/Chest: Effort normal and breath sounds normal. No respiratory distress.  Abdominal: Soft. She exhibits no distension and no mass. There is tenderness. There is no guarding.       Mild diffuse tenderness, worse on left side. No rebound or guarding. No distention.  Genitourinary:       No CVA tenderness  Musculoskeletal: She exhibits no edema and no tenderness.  Neurological: She is alert.  Skin: Skin is warm and dry.  Psychiatric: She has a normal mood and affect. Her behavior is normal. Thought content normal.    ED Course  Procedures (including critical care time)  Labs Reviewed  CBC - Abnormal; Notable for the following:    WBC 14.0 (*)    RBC 3.18 (*)    Hemoglobin 10.5 (*)    HCT 30.6 (*)    Platelets 107 (*)    All other components within normal limits  BASIC METABOLIC PANEL - Abnormal; Notable for the following:    Potassium 2.7 (*)    Glucose, Bld 105 (*)    Calcium 8.2 (*)    GFR calc non Af Amer 54 (*)    GFR calc Af Amer 62 (*)    All other components within normal limits  URINALYSIS, ROUTINE W REFLEX MICROSCOPIC - Abnormal; Notable for the following:    APPearance CLOUDY (*)    Bilirubin Urine SMALL (*)    Ketones, ur 40 (*)    Protein, ur 30 (*)    Leukocytes, UA SMALL (*)    All other components within normal limits  TROPONIN I  URINE MICROSCOPIC-ADD ON  CLOSTRIDIUM DIFFICILE BY PCR   Dg Chest 2 View  12/22/2011  *RADIOLOGY REPORT*  Clinical Data: Nausea vomiting and diarrhea.  History of myocardial infarction.  The patient is in a back brace.  CHEST - 2 VIEW  Comparison: 09/15/2010  Findings: Osteopenia.  Lower thoracic compression deformity has undergone prior augmentation.  Moderate thoracic  spondylosis.  Lateral view degraded by patient arm position.  The frontal view is degraded by overlying artifact from  the patients back brace.  There are numerous right rib fractures which are nonacute.  Mild cardiomegaly.  Costophrenic angle blunting on the frontal is due to pleural thickening.  No gross pneumothorax. No congestive failure.  Probable pleural thickening adjacent right rib fractures.  Mild bibasilar volume loss.  Apparent nodular density at the left lung base favored to represent an area of scarring and is similar to on the 2012 plain film.  Hyperinflation.  IMPRESSION:  1.  Moderately degraded exam, secondary to the patient's back brace. 2.  Hyperinflation and cardiomegaly, without acute disease.  Original Report Authenticated By: Areta Haber, M.D.   Ct Abdomen Pelvis W Contrast  12/22/2011  *RADIOLOGY REPORT*  Clinical Data: Pain.  CT ABDOMEN AND PELVIS WITH CONTRAST  Technique:  Multidetector CT imaging of the abdomen and pelvis was performed following the standard protocol during bolus administration of intravenous contrast.  Contrast: 157mL OMNIPAQUE IOHEXOL 300 MG/ML  SOLN  Comparison: None.  Findings: Small left effusion.  There is no focal liver abnormality.  The gallbladder appears normal.  No biliary dilatation.  The pancreas is unremarkable.  Normal appearance of the spleen.  The adrenal glands are both normal. Bilateral renal hypodensities. Likely cysts.  No obstructive uropathy.  The urinary bladder appears normal.  Postoperative changes suggestive of hysterectomy.  There is a moderate size hiatal hernia.  The small bowel loops have a normal course and caliber.  The proximal colon is unremarkable. From the splenic flexure through the rectum there is abnormal bowel wall edema, mucosal enhancement, and pericolonic inflammatory change. No perforation identified.  No significant free fluid or abnormal fluid collections identified.  No pneumatosis identified. The portal vein appears  normal and patent.  Contrast opacification of the superior mesenteric artery and inferior mesenteric artery noted.  Review of the visualized osseous structures is significant for multilevel degenerative disc disease.  T12 compression fracture is again noted and appears to have been treated with bone cement.  IMPRESSION: 1.  Examination is positive for acute colitis involving the left colon from the hepatic flexure through the rectum. 2.  No evidence for bowel perforation or abscess formation.  Original Report Authenticated By: Angelita Ingles, M.D.   EKG:  Rhythm: Sinus tachycardia Rate: 129 Axis: Left Intervals: First degree AV block. Abnormal R wave progression ST segments: Nonspecific ST changes. There is T-wave flattening anteriorly.   1. Colitis   2. Sepsis       MDM  78 year old female with abdominal pain and fever. CT scan is significant for colitis. There is no evidence of abscess or perforation. Given fever and ongoing tachycardia despite fluid bolus we'll admit for further treatment and evaluation. Hypokalemia. Replete. Do not think primary cardiac reason for tachycardia. Sinus tach and likely from fever/inflammatory response. Pt with no CP or SOB.        Virgel Manifold, MD 12/22/11 2325

## 2011-12-22 NOTE — ED Notes (Signed)
Pt.'s heart rate 121.  Pt. Comfortable sitting with family.  Denies nausea, vomiting.

## 2011-12-22 NOTE — Patient Instructions (Addendum)
Share records with [facility] ER Triage Nurse . Please take all actual pill bottles to the hospital for reviewed. The name of the prescribing physician should be noted. Active meds should be documented.  [id number] Home Health Certification  & Plan of Care  For 6/18-8/16/2013  reviewed & completed. Discrepancies noted between EMR Med List & meds listed on form . Handwritten notes made on form  & request made for clarification as follows: Please document the name of the prescribing caregiver beside each medication listed on the patient's Plan of Care medication list.  This is to enhance continuity of care and prevent potential  adverse drug:drug interactions due to duplication of medications as branded and generic forms; repeated medication changes with hospitalizations and/or  at outpatient subspecialty visits; and lack of the EMR in some medical practices. Such risk is greatest with polypharmacy especially among  geriatric patients as has been documented repeatedly by Dr. Elliot Cousin and his associates in multiple long-term studies.

## 2011-12-22 NOTE — H&P (Addendum)
[name] is an 77 y.o. female.   Chief Complaint: n/v HPI: 78 yo female with hx of ischemic colitis, htn, svt, cardiomyopathy (likely Tako Tsubo, dx 2005), nonst elevation MI 2005, c/o n/v, intermittently while at [facility] and then the n/v, became worse and also started having some abdominal pain, today.  diffuse, dull ache, fairly constant.  Pt had n/v x3-4 times today, "just dry heaves",  Also notes diarrhea a few times today, "mucous and watery".  Notes subjective fever, chills. Denies constipation, brbpr, black stool.    Pt was seen by Dr. Linna Darner today and sent to ED for evaluation.    In ED, CT scan abdomen and pelvis showed evidence of left sided colitis.   Past Medical History  Diagnosis Date  . 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 Resolved. --dx'd on cath 2005. EF 29% with minimal distal CAD (small OM1 70-80).  Echo 10/09 EF 55%  . Hypertension     SEVERE  . Idiopathic thrombocytopenic purpura (ITP)     DR. Jamse Arn  . Granuloma annulare   . History of phlebitis   . Lower extremity edema   . Osteoporosis   . Colitis, ischemic   . Fall     with non-healing rib fractures  . Lumbar stenosis 2004    DR. MARK ROY  . AV block, 1st degree   . Edema   . Complication of anesthesia     hard to wake up  . MI (myocardial infarction)     Non ST elevation 2005 per pt  . Compression fracture of C-spine 10/10/2011    [place] [address] at home, tx at [facility]    Past Surgical History  Procedure Date  . Cataract extraction, bilateral   . Colonoscopy w/ polypectomy   . Abdominal hysterectomy   . Rotator cuff repair   . Corneal transplant 11-27-02    left eye  . Cardiac catheterization     12/2003  . Back surgery 10/2004    LS Disc 4-5 no sx.  . Colonoscopy 09/2005    adenoma  . Rectal bleed 05-01-06    colonoscopy-ischemic colitis  . Appendectomy   . Vertebroplasty     Family History  Problem Relation Age of Onset  . Coronary artery disease Mother    Social  History:  reports that she has never smoked. She has never used smokeless tobacco. She reports that she does not drink alcohol or use illicit drugs.  Allergies:  Allergies  Allergen Reactions  . Penicillins     REACTION: joint edema  . Alendronate Sodium   . Colchicine     REACTION: hair loss  . Streptomycin   . Morphine And Related     nausea     (Not in a hospital admission)  Results for orders placed during the hospital encounter of 12/22/11 (from the past 48 hour(s))  CBC     Status: Abnormal   Collection Time   12/22/11  4:45 PM      Component Value Range Comment   WBC 14.0 (*) 4.0 - 10.5 (K/uL)    RBC 3.18 (*) 3.87 - 5.11 (MIL/uL)    Hemoglobin 10.5 (*) 12.0 - 15.0 (g/dL)    HCT 30.6 (*) 36.0 - 46.0 (%)    MCV 96.2  78.0 - 100.0 (fL)    MCH 33.0  26.0 - 34.0 (pg)    MCHC 34.3  30.0 - 36.0 (g/dL)    RDW 13.8  11.5 - 15.5 (%)  Platelets 107 (*) 150 - 400 (K/uL)   TROPONIN I     Status: Normal   Collection Time   12/22/11  4:45 PM      Component Value Range Comment   Troponin I <0.30  <0.30 (ng/mL)   BASIC METABOLIC PANEL     Status: Abnormal   Collection Time   12/22/11  4:45 PM      Component Value Range Comment   Sodium 139  135 - 145 (mEq/L)    Potassium 2.7 (*) 3.5 - 5.1 (mEq/L)    Chloride 104  96 - 112 (mEq/L)    CO2 22  19 - 32 (mEq/L)    Glucose, Bld 105 (*) 70 - 99 (mg/dL)    BUN 17  6 - 23 (mg/dL)    Creatinine, Ser 0.93  0.50 - 1.10 (mg/dL)    Calcium 8.2 (*) 8.4 - 10.5 (mg/dL)    GFR calc non Af Amer 54 (*) >90 (mL/min)    GFR calc Af Amer 62 (*) >90 (mL/min)   URINALYSIS, ROUTINE W REFLEX MICROSCOPIC     Status: Abnormal   Collection Time   12/22/11  5:05 PM      Component Value Range Comment   Color, Urine YELLOW  YELLOW     APPearance CLOUDY (*) CLEAR     Specific Gravity, Urine 1.023  1.005 - 1.030     pH 6.0  5.0 - 8.0     Glucose, UA NEGATIVE  NEGATIVE (mg/dL)    Hgb urine dipstick NEGATIVE  NEGATIVE     Bilirubin Urine SMALL (*)  NEGATIVE     Ketones, ur 40 (*) NEGATIVE (mg/dL)    Protein, ur 30 (*) NEGATIVE (mg/dL)    Urobilinogen, UA 0.2  0.0 - 1.0 (mg/dL)    Nitrite NEGATIVE  NEGATIVE     Leukocytes, UA SMALL (*) NEGATIVE    URINE MICROSCOPIC-ADD ON     Status: Normal   Collection Time   12/22/11  5:05 PM      Component Value Range Comment   WBC, UA 3-6  <3 (WBC/hpf)    Urine-Other MUCOUS PRESENT      Dg Chest 2 View  12/22/2011  *RADIOLOGY REPORT*  Clinical Data: Nausea vomiting and diarrhea.  History of myocardial infarction.  The patient is in a back brace.  CHEST - 2 VIEW  Comparison: 09/15/2010  Findings: Osteopenia.  Lower thoracic compression deformity has undergone prior augmentation.  Moderate thoracic spondylosis.  Lateral view degraded by patient arm position.  The frontal view is degraded by overlying artifact from the patients back brace.  There are numerous right rib fractures which are nonacute.  Mild cardiomegaly.  Costophrenic angle blunting on the frontal is due to pleural thickening.  No gross pneumothorax. No congestive failure.  Probable pleural thickening adjacent right rib fractures.  Mild bibasilar volume loss.  Apparent nodular density at the left lung base favored to represent an area of scarring and is similar to on the 2012 plain film.  Hyperinflation.  IMPRESSION:  1.  Moderately degraded exam, secondary to the patient's back brace. 2.  Hyperinflation and cardiomegaly, without acute disease.  Original Report Authenticated By: Areta Haber, M.D.   Ct Abdomen Pelvis W Contrast  12/22/2011  *RADIOLOGY REPORT*  Clinical Data: Pain.  CT ABDOMEN AND PELVIS WITH CONTRAST  Technique:  Multidetector CT imaging of the abdomen and pelvis was performed following the standard protocol during bolus administration of intravenous contrast.  Contrast: 155mL OMNIPAQUE  IOHEXOL 300 MG/ML  SOLN  Comparison: None.  Findings: Small left effusion.  There is no focal liver abnormality.  The gallbladder appears  normal.  No biliary dilatation.  The pancreas is unremarkable.  Normal appearance of the spleen.  The adrenal glands are both normal. Bilateral renal hypodensities. Likely cysts.  No obstructive uropathy.  The urinary bladder appears normal.  Postoperative changes suggestive of hysterectomy.  There is a moderate size hiatal hernia.  The small bowel loops have a normal course and caliber.  The proximal colon is unremarkable. From the splenic flexure through the rectum there is abnormal bowel wall edema, mucosal enhancement, and pericolonic inflammatory change. No perforation identified.  No significant free fluid or abnormal fluid collections identified.  No pneumatosis identified. The portal vein appears normal and patent.  Contrast opacification of the superior mesenteric artery and inferior mesenteric artery noted.  Review of the visualized osseous structures is significant for multilevel degenerative disc disease.  T12 compression fracture is again noted and appears to have been treated with bone cement.  IMPRESSION: 1.  Examination is positive for acute colitis involving the left colon from the hepatic flexure through the rectum. 2.  No evidence for bowel perforation or abscess formation.  Original Report Authenticated By: Angelita Ingles, M.D.    Review of Systems  Constitutional: Positive for fever and chills. Negative for weight loss, malaise/fatigue and diaphoresis.  HENT: Negative for hearing loss, ear pain, nosebleeds, congestion, neck pain, tinnitus and ear discharge.   Eyes: Negative for blurred vision, double vision, photophobia, pain, discharge and redness.  Respiratory: Negative for cough, hemoptysis, sputum production, shortness of breath, wheezing and stridor.   Cardiovascular: Negative for chest pain, palpitations, orthopnea, claudication, leg swelling and PND.  Gastrointestinal: Positive for nausea, vomiting, abdominal pain and diarrhea. Negative for heartburn, constipation, blood in  stool and melena.  Genitourinary: Negative for dysuria, urgency, frequency, hematuria and flank pain.  Musculoskeletal: Negative for myalgias, back pain, joint pain and falls.  Skin: Positive for rash. Negative for itching.  Neurological: Negative for dizziness, tingling, tremors, sensory change, speech change, focal weakness, seizures, loss of consciousness, weakness and headaches.  Endo/Heme/Allergies: Negative for environmental allergies and polydipsia. Does not bruise/bleed easily.  Psychiatric/Behavioral: Negative for depression, suicidal ideas, hallucinations, memory loss and substance abuse. The patient is not nervous/anxious and does not have insomnia.     Blood pressure 144/84, pulse 136, temperature 100.8 F (38.2 C), temperature source Rectal, resp. rate 28, weight 72.576 kg (160 lb), SpO2 97.00%. Physical Exam  Constitutional: She is oriented to person, place, and time. She appears well-developed and well-nourished. No distress.  HENT:  Head: Normocephalic and atraumatic.  Mouth/Throat: No oropharyngeal exudate.  Eyes: Conjunctivae are normal. Pupils are equal, round, and reactive to light. Right eye exhibits no discharge. Left eye exhibits no discharge. No scleral icterus.  Neck: Normal range of motion. Neck supple. No JVD present. No tracheal deviation present. No thyromegaly present.  Cardiovascular: Normal rate, regular rhythm, normal heart sounds and intact distal pulses.  Exam reveals no gallop and no friction rub.   No murmur heard. Respiratory: Effort normal and breath sounds normal. No stridor. No respiratory distress. She has no wheezes. She has no rales. She exhibits no tenderness.  GI: Soft. Bowel sounds are normal. She exhibits no distension and no mass. There is no tenderness. There is no rebound and no guarding.  Musculoskeletal: Normal range of motion. She exhibits no edema and no tenderness.  Lymphadenopathy:    She  has no cervical adenopathy.  Neurological: She  is alert and oriented to person, place, and time. She has normal reflexes. She displays normal reflexes. No cranial nerve deficit. She exhibits normal muscle tone. Coordination normal.  Skin: Skin is warm and dry. No rash noted. She is not diaphoretic. No erythema. No pallor.  Psychiatric: She has a normal mood and affect. Her behavior is normal. Judgment and thought content normal.     Assessment/Plan Abdominal pain secondary to colitis Npo except medications and ice chips NS iv Cipro iv and Flagyl iv Consider GI consult (Dr. Henrene Pastor, primary GI)   Diarrhea: Check stool for fecal leukocytes, culture, c. Diff.   Fever ? secoadnry to colitis: continue to monitor Blood cx x 2 sets  Tachycardia ? seconadry to not getting her carvedilol today Metoprolol 2.5mg  iv x1 and then Carvedilol  Check tsh, check cpk, mb, trop i q6h prn chech cardiac echo  Anemia: Repeat cbc in am  Hypokalemia likely secondary to n/v, and lasix Replete   Jani Gravel 12/22/2011, 10:05 PM

## 2011-12-22 NOTE — ED Notes (Signed)
MD at bedside. 

## 2011-12-22 NOTE — ED Notes (Signed)
Sent from MD office for tachycardia

## 2011-12-22 NOTE — ED Notes (Signed)
Potassium supplement IV stopped for pt to go to CT....will restart upon return

## 2011-12-22 NOTE — Progress Notes (Signed)
  Subjective:    Patient ID: [name], female    DOB: 06/02/1923, 78 y.o.   MRN: [id number]  HPI  NAUSEA & ABDOMINAL PAIN:  The complicated course was reviewed based on records from [facility]; Problem List updated. At this time she continues to be nauseated and  Is having constant mid to lower abdominal discomfort. She is having essentially no stools; any stools are essentially mucus. Nausea actually was present throughout her hospitalization but exacerbated last week while in rehabilitation  Previous treatment: anti nausea pill ; she was unable to tolerate this There are no historical factors related to the diarrhea as such as travel, sick contacts, suspicious due to water, or change in diet other than that related to the hospitalization.   Red Flags Fever: chills w/o fever  Bloody stools:no Recent antibiotics:Cefepime post arrest X 8 days Immuncompromised: Pancytopenia in the context of B12 deficiency ; not on Dapsone    Review of Systems Vomiting: dry heaves but constant nausea since 6/9. NPO since 6/9 excetp some liquids  Weight loss:10#  Decreased urine output: ?  Lightheadedness: no   There is uncertainty as to her exact medications. She's been off the B12 since March.       Objective:   Physical Exam  She appears somewhat debilitated and fatigued but not acutely ill  She is wearing a neck and thorax brace which limits exam of the neck.  She has no scleral icterus or jaundice.  Oropharynx reveals good dental hygiene and absence of erythema. Tongue is moist  The lung bases are clear with minimal rales. She is no increased work of breathing  She has an S4 tachycardia  Bowel sounds are slightly decreased. Abdomen soft but tender in the mid abdominal area.  She has trace-1/2+ edema.  All pulses are intact but the pedal pulses are decreased due to the edema. Carotid artery pulses could not be checked due to the brace        Assessment & Plan:  #1  recurrent, persistent nausea  #2 abdominal discomfort  #3 history of cardiac arrest with  aspiration  #4 paroxysmal tachycardia at [facility] for which she was on amiodarone for a period time  #5 uncontrolled hypertension; her present antihypertensive regimen is unclear  #6 B12 deficiency; no supplementation x2 months  # 7 granuloma annulare  Plan: The intractable nausea and abdominal pain will most likely require parenteral medications. The tachycardia and uncontrolled hypertension place her for extremely high risk for recurrent cardiac event. These risks were discussed with her and her son Alvester Chou. Readmission is mandatory as simply monitoring labs and following up in 24-48 hours places her health/life at great risk potentially

## 2011-12-22 NOTE — Telephone Encounter (Signed)
Patient called and canceled her appt for today. Per the patient, she is sick and needs to see her PCP. Desk RN notified. JMW

## 2011-12-23 DIAGNOSIS — R112 Nausea with vomiting, unspecified: Secondary | ICD-10-CM

## 2011-12-23 DIAGNOSIS — R000 Tachycardia, unspecified: Secondary | ICD-10-CM

## 2011-12-23 DIAGNOSIS — R1084 Generalized abdominal pain: Secondary | ICD-10-CM

## 2011-12-23 DIAGNOSIS — R197 Diarrhea, unspecified: Secondary | ICD-10-CM

## 2011-12-23 LAB — CBC
HCT: 29.8 % — ABNORMAL LOW (ref 36.0–46.0)
Hemoglobin: 10.2 g/dL — ABNORMAL LOW (ref 12.0–15.0)
MCV: 96.4 fL (ref 78.0–100.0)
RBC: 3.09 MIL/uL — ABNORMAL LOW (ref 3.87–5.11)
WBC: 15.9 10*3/uL — ABNORMAL HIGH (ref 4.0–10.5)

## 2011-12-23 LAB — CARDIAC PANEL(CRET KIN+CKTOT+MB+TROPI)
CK, MB: 1.4 ng/mL (ref 0.3–4.0)
CK, MB: 1.5 ng/mL (ref 0.3–4.0)
Relative Index: INVALID (ref 0.0–2.5)
Relative Index: INVALID (ref 0.0–2.5)
Total CK: 20 U/L (ref 7–177)
Total CK: 24 U/L (ref 7–177)
Troponin I: <0.3 ng/mL (ref <0.30)
Troponin I: <0.3 ng/mL (ref <0.30)
Troponin I: <0.3 ng/mL (ref <0.30)

## 2011-12-23 LAB — HEPATIC FUNCTION PANEL
Alkaline Phosphatase: 92 U/L (ref 39–117)
Indirect Bilirubin: 0.5 mg/dL (ref 0.3–0.9)
Total Bilirubin: 0.7 mg/dL (ref 0.3–1.2)
Total Protein: 5.1 g/dL — ABNORMAL LOW (ref 6.0–8.3)

## 2011-12-23 LAB — DIFFERENTIAL
Basophils Absolute: 0 10*3/uL (ref 0.0–0.1)
Eosinophils Relative: 0 % (ref 0–5)
Lymphocytes Relative: 4 % — ABNORMAL LOW (ref 12–46)
Lymphs Abs: 0.6 10*3/uL — ABNORMAL LOW (ref 0.7–4.0)
Monocytes Absolute: 1.1 10*3/uL — ABNORMAL HIGH (ref 0.1–1.0)
Monocytes Relative: 7 % (ref 3–12)
Neutro Abs: 14.2 10*3/uL — ABNORMAL HIGH (ref 1.7–7.7)

## 2011-12-23 LAB — COMPREHENSIVE METABOLIC PANEL
Albumin: 2.6 g/dL — ABNORMAL LOW (ref 3.5–5.2)
Alkaline Phosphatase: 92 U/L (ref 39–117)
BUN: 16 mg/dL (ref 6–23)
Calcium: 7.7 mg/dL — ABNORMAL LOW (ref 8.4–10.5)
GFR calc Af Amer: 59 mL/min — ABNORMAL LOW (ref >90)
Glucose, Bld: 106 mg/dL — ABNORMAL HIGH (ref 70–99)
Potassium: 2.9 mEq/L — ABNORMAL LOW (ref 3.5–5.1)
Total Protein: 5.1 g/dL — ABNORMAL LOW (ref 6.0–8.3)

## 2011-12-23 LAB — LIPASE, BLOOD: Lipase: 7 U/L — ABNORMAL LOW (ref 11–59)

## 2011-12-23 LAB — CLOSTRIDIUM DIFFICILE BY PCR: Toxigenic C. Difficile by PCR: POSITIVE — AB

## 2011-12-23 MED ORDER — BRIMONIDINE TARTRATE 0.2 % OP SOLN
1.0000 [drp] | Freq: Two times a day (BID) | OPHTHALMIC | Status: DC
  Administered 2011-12-23 – 2011-12-28 (×12): 1 [drp] via OPHTHALMIC
  Filled 2011-12-23: qty 5

## 2011-12-23 MED ORDER — SODIUM CHLORIDE 0.9 % IJ SOLN
3.0000 mL | Freq: Two times a day (BID) | INTRAMUSCULAR | Status: DC
  Administered 2011-12-23 – 2011-12-28 (×10): 3 mL via INTRAVENOUS

## 2011-12-23 MED ORDER — BENAZEPRIL HCL 20 MG PO TABS
20.0000 mg | ORAL_TABLET | Freq: Every day | ORAL | Status: DC
  Administered 2011-12-25 – 2011-12-28 (×4): 20 mg via ORAL
  Filled 2011-12-23 (×6): qty 1

## 2011-12-23 MED ORDER — PANTOPRAZOLE SODIUM 40 MG PO TBEC
40.0000 mg | DELAYED_RELEASE_TABLET | Freq: Every day | ORAL | Status: DC
  Administered 2011-12-23 – 2011-12-25 (×2): 40 mg via ORAL
  Filled 2011-12-23 (×4): qty 1

## 2011-12-23 MED ORDER — POLYVINYL ALCOHOL 1.4 % OP SOLN
1.0000 [drp] | Freq: Two times a day (BID) | OPHTHALMIC | Status: DC
  Administered 2011-12-23 – 2011-12-28 (×12): 1 [drp] via OPHTHALMIC
  Filled 2011-12-23: qty 15

## 2011-12-23 MED ORDER — CARVEDILOL 25 MG PO TABS
25.0000 mg | ORAL_TABLET | Freq: Two times a day (BID) | ORAL | Status: DC
  Administered 2011-12-23 – 2011-12-28 (×3): 25 mg via ORAL
  Filled 2011-12-23 (×14): qty 1

## 2011-12-23 MED ORDER — CARBOXYMETHYLCELLULOSE SODIUM 0.5 % OP SOLN: 1.0000 [drp] | Freq: Two times a day (BID) | OPHTHALMIC | Status: DC

## 2011-12-23 MED ORDER — OSTEO BI-FLEX ADV JOINT SHIELD PO TABS
2.0000 | ORAL_TABLET | Freq: Every day | ORAL | Status: DC
Start: 2011-12-23 — End: 2011-12-23

## 2011-12-23 MED ORDER — HYDROMORPHONE HCL PF 1 MG/ML IJ SOLN: 0.5000 mg | INTRAMUSCULAR | Status: DC | PRN

## 2011-12-23 MED ORDER — POTASSIUM CHLORIDE 10 MEQ/100ML IV SOLN
10.0000 meq | Freq: Once | INTRAVENOUS | Status: AC
  Administered 2011-12-23: 10 meq via INTRAVENOUS
  Filled 2011-12-23: qty 100

## 2011-12-23 MED ORDER — FLUTICASONE PROPIONATE 50 MCG/ACT NA SUSP
2.0000 | Freq: Every day | NASAL | Status: DC
  Administered 2011-12-23 – 2011-12-28 (×5): 2 via NASAL
  Filled 2011-12-23: qty 16

## 2011-12-23 MED ORDER — OCUVITE PO TABS
1.0000 | ORAL_TABLET | Freq: Every day | ORAL | Status: DC
  Administered 2011-12-23: 1 via ORAL
  Filled 2011-12-23 (×6): qty 1

## 2011-12-23 MED ORDER — PROMETHAZINE HCL 25 MG/ML IJ SOLN
12.5000 mg | Freq: Four times a day (QID) | INTRAMUSCULAR | Status: DC | PRN
  Administered 2011-12-23: 12.5 mg via INTRAVENOUS
  Filled 2011-12-23: qty 1

## 2011-12-23 MED ORDER — DIPHENHYDRAMINE HCL 50 MG/ML IJ SOLN: 12.5000 mg | Freq: Four times a day (QID) | INTRAMUSCULAR | Status: DC | PRN

## 2011-12-23 MED ORDER — TIMOLOL MALEATE 0.25 % OP SOLN
1.0000 [drp] | Freq: Two times a day (BID) | OPHTHALMIC | Status: DC
  Administered 2011-12-23 – 2011-12-26 (×8): 1 [drp] via OPHTHALMIC
  Filled 2011-12-23: qty 5

## 2011-12-23 NOTE — Progress Notes (Signed)
Pt. c-diff is pos. Dr. Garwin Brothers HAS BEEN NOTIFIED NO NEW ORDERS.

## 2011-12-23 NOTE — Progress Notes (Signed)
   CARE MANAGEMENT NOTE 12/23/2011  Patient:  [name], [name]   Account Number:  [id number]  Date Initiated:  12/23/2011  Documentation initiated by:  Olga Coaster  Subjective/Objective Assessment:   ADMITTED WITH Abdominal pain secondary to colitis     Action/Plan:   PATIENT RESIDES IN A NURSING FACILITY [facility]; SOCIAL WORKER REFERRAL PLACED; PATIENT IS ACTIVE WITH [facility] HH[facility]/OT/RN   Anticipated DC Date:  12/30/2011   Anticipated DC Plan:  ASSISTED LIVING / REST HOME  In-house referral  Clinical Social Worker      DC Forensic scientist  CM consult               Status of service:  In process, will continue to follow Medicare Important Message given?  NA - LOS <3 / Initial given by admissions (If response is "NO", the following Medicare IM given date fields will be blank)  Per UR Regulation:  Reviewed for med. necessity/level of care/duration of stay  Comments:  12/23/2011- B Torres Hardenbrook RN, BSN, MHA

## 2011-12-23 NOTE — Progress Notes (Signed)
Patient ID: [name], female   DOB: 1923/07/08, 78 y.o.   MRN: [id number]  [facility] PROGRESS NOTE  [name] [id number] DOB: January 25, 1923 DOA: 12/22/2011 PCP: Unice Cobble, MD  Brief narrative: Pt is 78 yo female admitted on 12/22/2011 with main concern of abdominal pain associated with non bloody diarrhea. Found to be C. Diff positive.  Consultants:  none  Procedures:  none  Antibiotics:  Ciprofloxacin 6/11 -->  Flagyl 6/11 -->  HPI/Subjective: Pt denies any pain at this time but reports persistent diarrhea.   Objective: Filed Vitals:   12/23/11 0500 12/23/11 0612 12/23/11 1429 12/23/11 2150  BP:  125/76 102/63 92/55  Pulse:  122 59 55  Temp:  98.3 F (36.8 C) 97.7 F (36.5 C) 97.7 F (36.5 C)  TempSrc:  Oral Axillary Oral  Resp:  20 19 20   Height:      Weight: 78.2 kg (172 lb 6.4 oz) 78.2 kg (172 lb 6.4 oz)    SpO2:  96% 96% 94%    Intake/Output Summary (Last 24 hours) at 12/23/11 2234 Last data filed at 12/23/11 1728  Gross per 24 hour  Intake 1425.33 ml  Output     25 ml  Net 1400.33 ml    Exam:   General:  Pleasant and alert, not in acute distress, neck collar in place  Cardiovascular: Regular rhythm but tachycardic, S1/S2 present  Respiratory: Clear to auscultation bilaterally with minimal bibasilar crackles  Abdomen: Soft, non tender and non distended, bowel sounds present  Data Reviewed: Basic Metabolic Panel:  Lab [id number] 0244 12/22/11 1645  NA 134* 139  K 2.9* 2.7*  CL 100 104  CO2 23 22  GLUCOSE 106* 105*  BUN 16 17  CREATININE 0.97 0.93  CALCIUM 7.7* 8.2*   Liver Function Tests:  Lab 12/23/11 0244 12/23/11 0240  AST 14 14  ALT 7 8  ALKPHOS 92 92  BILITOT 0.7 0.7  PROT 5.1* 5.1*  ALBUMIN 2.6* 2.6*    Lab 12/23/11 0240  LIPASE 7*  AMYLASE --   CBC:  Lab 12/23/11 0244 12/22/11 1645  WBC 15.9* 14.0*  NEUTROABS 14.2* --  HGB 10.2* 10.5*  HCT 29.8* 30.6*  MCV 96.4 96.2  PLT 103* 107*    Cardiac Enzymes:  Lab 12/23/11 1450 12/23/11 0825 12/23/11 0240 12/22/11 1645  CKTOTAL 24 17 20  --  CKMB 1.5 1.4 1.3 --  CKMBINDEX -- -- -- --  TROPONINI <0.30 <0.30 <0.30 <0.30    Recent Results (from the past 240 hour(s))  CLOSTRIDIUM DIFFICILE BY PCR     Status: Abnormal   Collection Time   12/22/11  8:54 PM      Component Value Range Status Comment   C difficile by pcr POSITIVE (*) NEGATIVE Final      Studies:  Dg Chest 2 View 12/22/2011   IMPRESSION:   1.  Moderately degraded exam, secondary to the patient's back brace.  2.  Hyperinflation and cardiomegaly, without acute disease.    Ct Abdomen Pelvis W Contrast 12/22/2011    IMPRESSION:  1.  Examination is positive for acute colitis involving the left colon from the hepatic flexure through the rectum.  2.  No evidence for bowel perforation or abscess formation.     Scheduled Meds:   . sodium chloride   Intravenous STAT  . acetaminophen  650 mg Oral Once  . benazepril  20 mg Oral Daily  . beta carotene w/minerals  1 tablet Oral Daily  . brimonidine  1 drop Right Eye BID  . carvedilol  25 mg Oral BID WC  . ciprofloxacin  400 mg Intravenous Once  . ciprofloxacin  400 mg Intravenous Q12H  . fluticasone  2 spray Each Nare Daily  . metoprolol  2.5 mg Intravenous Once  . metronidazole  500 mg Intravenous Once  . metronidazole  500 mg Intravenous Q8H  . pantoprazole  40 mg Oral Q1200  . polyvinyl alcohol  1 drop Both Eyes BID  . potassium chloride  10 mEq Intravenous Once  . potassium chloride  10 mEq Intravenous Once  . potassium chloride  60 mEq Oral Once  . sodium chloride  1,000 mL Intravenous Once  . sodium chloride  3 mL Intravenous Q12H  . timolol  1 drop Right Eye BID  . DISCONTD: carboxymethylcellulose  1 drop Both Eyes BID  . DISCONTD: Osteo Bi-Flex Adv Joint Shield  2 tablet Oral Daily   Continuous Infusions:    Assessment/Plan  Principal Problem:  *Colitis - please note that C. Diff is  positive - will continue Ciprofloxacin and Flagyl as noted above - continue to provide supportive care with IVF and antiemetics as needed - supplement electrolytes as indicated  Hypokalemia - secondary to diarrhea - will supplement as indicated and will check BMP in AM  Hyponatremia - secondary to volume loss - will continue IVF - BMP in AM  Anemia of chronic disease - Hg and Hct remain stable and at pt's baseline - CBC in AM  Leukocytosis - secondary to colitis - will continue antibiotics as noted above - CBC in AM  Thrombocytopenia - likely reactive - no signs of acute bleeding - CBC in AM  Code Status: DNR Family Communication: Pt at bedside and family over the phone Disposition Plan: Not ready for discharge   Faye Ramsay, MD  [facility] Pager [phone]  If 7PM-7AM, please contact night-coverage [email or web address] Password [facility] 12/23/2011, 10:34 PM    LOS: 1 day

## 2011-12-23 NOTE — Progress Notes (Signed)
PHARMACIST - PHYSICIAN ORDER COMMUNICATION  CONCERNING: P&T Medication Policy on Herbal Medications  DESCRIPTION:  This patient's order for:  [place]  has been noted.  This product(s) is classified as an "herbal" or natural product. Due to a lack of definitive safety studies or FDA approval, nonstandard manufacturing practices, plus the potential risk of unknown drug-drug interactions while on inpatient medications, the Pharmacy and Therapeutics Committee does not permit the use of "herbal" or natural products of this type within [facility].   ACTION TAKEN: The pharmacy department is unable to verify this order at this time and your patient has been informed of this safety policy. Please reevaluate patient's clinical condition at discharge and address if the herbal or natural product(s) should be resumed at that time.   Dorrene German 12/23/2011 1:35 AM

## 2011-12-23 NOTE — Progress Notes (Signed)
Pt has an order for stool sample to be sent, Rn called lab because it did not seem as stool sample had been sent but  staff Roderic Ovens) at lab said they have stool sample and it has been sent out to [facility] and they were waiting on the results. Still awaiting results c'diff and other stool results.---[name]. Amri Lien, rn

## 2011-12-24 ENCOUNTER — Inpatient Hospital Stay (HOSPITAL_COMMUNITY): Payer: Medicare Other

## 2011-12-24 ENCOUNTER — Telehealth: Payer: Self-pay | Admitting: Internal Medicine

## 2011-12-24 DIAGNOSIS — R197 Diarrhea, unspecified: Secondary | ICD-10-CM

## 2011-12-24 DIAGNOSIS — R112 Nausea with vomiting, unspecified: Secondary | ICD-10-CM

## 2011-12-24 DIAGNOSIS — R000 Tachycardia, unspecified: Secondary | ICD-10-CM

## 2011-12-24 DIAGNOSIS — R1084 Generalized abdominal pain: Secondary | ICD-10-CM

## 2011-12-24 LAB — BASIC METABOLIC PANEL
BUN: 25 mg/dL — ABNORMAL HIGH (ref 6–23)
CO2: 21 mEq/L (ref 19–32)
Calcium: 7.6 mg/dL — ABNORMAL LOW (ref 8.4–10.5)
Calcium: 7.7 mg/dL — ABNORMAL LOW (ref 8.4–10.5)
Creatinine, Ser: 1.56 mg/dL — ABNORMAL HIGH (ref 0.50–1.10)
GFR calc Af Amer: 34 mL/min — ABNORMAL LOW (ref >90)
GFR calc non Af Amer: 29 mL/min — ABNORMAL LOW (ref >90)
GFR calc non Af Amer: 29 mL/min — ABNORMAL LOW (ref >90)
Glucose, Bld: 130 mg/dL — ABNORMAL HIGH (ref 70–99)
Sodium: 136 mEq/L (ref 135–145)
Sodium: 137 mEq/L (ref 135–145)

## 2011-12-24 LAB — CBC
MCH: 32.6 pg (ref 26.0–34.0)
MCHC: 33.7 g/dL (ref 30.0–36.0)
MCV: 96.6 fL (ref 78.0–100.0)
Platelets: 116 10*3/uL — ABNORMAL LOW (ref 150–400)
RBC: 2.61 MIL/uL — ABNORMAL LOW (ref 3.87–5.11)

## 2011-12-24 LAB — RETICULOCYTES: Retic Count, Absolute: 67.6 10*3/uL (ref 19.0–186.0)

## 2011-12-24 MED ORDER — POTASSIUM CHLORIDE 10 MEQ/100ML IV SOLN
10.0000 meq | INTRAVENOUS | Status: AC
  Administered 2011-12-24 – 2011-12-25 (×2): 10 meq via INTRAVENOUS
  Filled 2011-12-24 (×6): qty 100

## 2011-12-24 MED ORDER — POTASSIUM CHLORIDE 10 MEQ/100ML IV SOLN
10.0000 meq | INTRAVENOUS | Status: AC
  Administered 2011-12-24 (×4): 10 meq via INTRAVENOUS
  Filled 2011-12-24 (×4): qty 100

## 2011-12-24 NOTE — Progress Notes (Signed)
Pt. Had decreased urine output today. Bladder scan performed. 38mL noted on scan. MD made aware. Renal ultrasound ordered prior to this finding. No new orders received. Also spoke with MD to clarify potassium runs. At this time pt. Is receiving 4th run of potassium. Order given to check potassium level after 4th dose is finished infusing. If potassium levels within normal limits, order given to discontinue additional runs that were ordered. If potassium level low, give additional ordered doses of potassium. Will carry out orders as given and continue to monitor pt. Adair Lauderback, Katherine Roan

## 2011-12-24 NOTE — Telephone Encounter (Signed)
The cardiologist  work at both hospitals & there is cardiac monitor at both hospitals. If EMT routes the patient from [facility], it is because there were no open cardiac  beds there.

## 2011-12-24 NOTE — Telephone Encounter (Signed)
Daughter in law, Lealer Rabon called and stated patient was taken to  [facility] not [facility], and is not really being treated for the heart concern you had for her. Can call [name] at [phone] FYI she is not on [facility]

## 2011-12-24 NOTE — Progress Notes (Signed)
PT Cancellation Note  Treatment cancelled today due to states she is up to [facility] frequently and does not want to walk yet.[name] 12/24/2011, 2:41 PM [phone]

## 2011-12-24 NOTE — Progress Notes (Signed)
Patient ID: [name], female   DOB: 17-Apr-1923, 78 y.o.   MRN: [id number]  [facility] PROGRESS NOTE  [name] [id number] DOB: 09-16-1922 DOA: 12/22/2011 PCP: Unice Cobble, MD  Brief narrative: Pt is 78 yo female admitted on 12/22/2011 with main concern of abdominal pain associated with non bloody diarrhea. Found to be C. Diff positive.  Consultants:  None  Procedures:  None  Antibiotics:  Ciprofloxacin 6/11 -->  Flagyl 6/11 -->  HPI/Subjective: No events overnight. Pt still reports diarrhea and no significant improvement.   Objective: Filed Vitals:   12/24/11 0500 12/24/11 0508 12/24/11 0820 12/24/11 1100  BP:  118/61 117/66 110/70  Pulse: 56 59 58 57  Temp:  97.3 F (36.3 C) 97.6 F (36.4 C)   TempSrc:  Oral Oral   Resp:  22 20   Height:      Weight:  79.1 kg (174 lb 6.1 oz)    SpO2:  95% 97%     Intake/Output Summary (Last 24 hours) at 12/24/11 1439 Last data filed at 12/24/11 0930  Gross per 24 hour  Intake    520 ml  Output      0 ml  Net    520 ml    Exam:   General:  Pt is alert, follows commands appropriately, not in acute distress  Cardiovascular: Regular rate and rhythm, S1/S2, no murmurs, no rubs, no gallops  Respiratory: Clear to auscultation bilaterally but slightly decreased at bases, no wheezing, no crackles, no rhonchi  Abdomen: Soft, non tender, non distended, bowel sounds present, no guarding  Extremities: No edema, pulses DP and PT palpable bilaterally  Neuro: Grossly nonfocal  Data Reviewed: Basic Metabolic Panel: Lab [id number] 12/23/11 0244 12/22/11 1645  NA 136 134* 139  K 2.6* 2.9* 2.7*  CL 103 100 104  CO2 21 23 22   GLUCOSE 95 106* 105*  BUN 24* 16 17  CREATININE 1.56* 0.97 0.93  CALCIUM 7.6* 7.7* 8.2*   Liver Function Tests: Lab 12/23/11 0244 12/23/11 0240  AST 14 14  ALT 7 8  ALKPHOS 92 92  BILITOT 0.7 0.7  PROT 5.1* 5.1*  ALBUMIN 2.6* 2.6*   Lab 12/23/11 0240  LIPASE 7*    AMYLASE --   CBC: Lab 12/24/11 0432 12/23/11 0244 12/22/11 1645  WBC 10.5 15.9* 14.0*  HGB 8.5* 10.2* 10.5*  HCT 25.2* 29.8* 30.6*  MCV 96.6 96.4 96.2  PLT 116* 103* 107*   Cardiac Enzymes: Lab 12/23/11 1450 12/23/11 0825 12/23/11 0240 12/22/11 1645  CKTOTAL 24 17 20  --  CKMB 1.5 1.4 1.3 --  TROPONINI <0.30 <0.30 <0.30 <0.30    Recent Results (from the past 240 hour(s))  CLOSTRIDIUM DIFFICILE BY PCR     Status: Abnormal   Collection Time   12/22/11  8:54 PM      Component Value Range Status Comment   C difficile by pcr POSITIVE (*) NEGATIVE Final   CULTURE, BLOOD (ROUTINE X 2)     Status: Normal (Preliminary result)   Collection Time   12/23/11  1:55 AM      Component Value Range Status Comment   Specimen Description BLOOD RIGHT ANTECUBITAL   Final    Special Requests BOTTLES DRAWN AEROBIC AND ANAEROBIC 5CC   Final    Culture  Setup Time [id number]   Final    Culture     Final    Value:        BLOOD CULTURE RECEIVED NO GROWTH TO DATE  CULTURE WILL BE HELD FOR 5 DAYS BEFORE ISSUING A FINAL NEGATIVE REPORT   Report Status PENDING   Incomplete   CULTURE, BLOOD (ROUTINE X 2)     Status: Normal (Preliminary result)   Collection Time   12/23/11  2:05 AM      Component Value Range Status Comment   Specimen Description BLOOD R HAND   Final    Special Requests Normal BOTTLES DRAWN AEROBIC ONLY [facility]   Final    Culture  Setup Time [id number]   Final    Culture     Final    Value:        BLOOD CULTURE RECEIVED NO GROWTH TO DATE CULTURE WILL BE HELD FOR 5 DAYS BEFORE ISSUING A FINAL NEGATIVE REPORT   Report Status PENDING   Incomplete      Studies:  Dg Chest 2 View 12/22/2011    IMPRESSION:   1.  Moderately degraded exam, secondary to the patient's back brace.  2.  Hyperinflation and cardiomegaly, without acute disease.   Ct Abdomen Pelvis W Contrast 12/22/2011    IMPRESSION:  1.  Examination is positive for acute colitis involving the left colon from the hepatic flexure  through the rectum.  2.  No evidence for bowel perforation or abscess formation.   Scheduled Meds:   . acetaminophen  650 mg Oral Once  . benazepril  20 mg Oral Daily  . beta carotene w/minerals  1 tablet Oral Daily  . brimonidine  1 drop Right Eye BID  . carvedilol  25 mg Oral BID WC  . ciprofloxacin  400 mg Intravenous Q12H  . fluticasone  2 spray Each Nare Daily  . metronidazole  500 mg Intravenous Q8H  . pantoprazole  40 mg Oral Q1200  . polyvinyl alcohol  1 drop Both Eyes BID  . potassium chloride  10 mEq Intravenous Once  . potassium chloride  10 mEq Intravenous Q1 Hr x 4  . potassium chloride  60 mEq Oral Once  . timolol  1 drop Right Eye BID   Continuous Infusions:    Assessment/Plan:  Principal Problem:  *Colitis  - please note that C. Diff is positive  - will continue Ciprofloxacin and Flagyl as noted above  - continue to provide supportive care with IVF and antiemetics as needed  - supplement electrolytes as indicated   Hypokalemia  - secondary to diarrhea  - slow recovery since pt refusing PO form of supplementations and wants to have KCl runs at low rate due to pain - will supplement as indicated and will check BMP in AM   Hyponatremia  - secondary to volume loss  - now resolved - BMP in AM   Anemia of chronic disease  - Hg and Hct drop from yesterday - FOBT check - CBC in AM   Leukocytosis  - secondary to colitis  - now resolved - will continue antibiotics as noted above  - CBC in AM   Thrombocytopenia  - likely reactive  - no signs of acute bleeding  - remains stable and at pt's baseline - CBC in AM   Acute renal failure - unclear etiology at this time - will check urine sodium and creatinine - renal US - continue IVF - BMP in AM  Code Status: DNR Family Communication: Pt at bedside Disposition Plan: Not medically stable for discharge  Faye Ramsay, MD  [facility] Pager [phone]  If 7PM-7AM, please  contact night-coverage [email or web address] Password [facility] 12/24/2011, 2:39 PM  LOS: 2 days

## 2011-12-25 DIAGNOSIS — R000 Tachycardia, unspecified: Secondary | ICD-10-CM

## 2011-12-25 DIAGNOSIS — R197 Diarrhea, unspecified: Secondary | ICD-10-CM

## 2011-12-25 DIAGNOSIS — R112 Nausea with vomiting, unspecified: Secondary | ICD-10-CM

## 2011-12-25 DIAGNOSIS — R1084 Generalized abdominal pain: Secondary | ICD-10-CM

## 2011-12-25 LAB — CBC
HCT: 28.2 % — ABNORMAL LOW (ref 36.0–46.0)
Hemoglobin: 9.4 g/dL — ABNORMAL LOW (ref 12.0–15.0)
MCH: 32.2 pg (ref 26.0–34.0)
MCHC: 33.3 g/dL (ref 30.0–36.0)
MCV: 96.6 fL (ref 78.0–100.0)

## 2011-12-25 LAB — BASIC METABOLIC PANEL
BUN: 23 mg/dL (ref 6–23)
CO2: 21 mEq/L (ref 19–32)
Calcium: 7.8 mg/dL — ABNORMAL LOW (ref 8.4–10.5)
GFR calc non Af Amer: 29 mL/min — ABNORMAL LOW (ref >90)
Glucose, Bld: 102 mg/dL — ABNORMAL HIGH (ref 70–99)

## 2011-12-25 LAB — CARDIAC PANEL(CRET KIN+CKTOT+MB+TROPI)
CK, MB: 1.5 ng/mL (ref 0.3–4.0)
Relative Index: INVALID (ref 0.0–2.5)
Total CK: 11 U/L (ref 7–177)

## 2011-12-25 LAB — FECAL LACTOFERRIN, QUANT

## 2011-12-25 LAB — VITAMIN B12: Vitamin B-12: 1824 pg/mL — ABNORMAL HIGH (ref 211–911)

## 2011-12-25 LAB — FERRITIN: Ferritin: 170 ng/mL (ref 10–291)

## 2011-12-25 LAB — IRON AND TIBC: Iron: 19 ug/dL — ABNORMAL LOW (ref 42–135)

## 2011-12-25 MED ORDER — FAMOTIDINE 20 MG PO TABS
20.0000 mg | ORAL_TABLET | Freq: Every day | ORAL | Status: DC
  Filled 2011-12-25 (×5): qty 1

## 2011-12-25 MED ORDER — PROMETHAZINE HCL 25 MG PO TABS: 12.5000 mg | ORAL_TABLET | ORAL | Status: DC | PRN

## 2011-12-25 MED ORDER — ENSURE COMPLETE PO LIQD
237.0000 mL | Freq: Two times a day (BID) | ORAL | Status: DC
  Administered 2011-12-25 – 2011-12-28 (×2): 237 mL via ORAL

## 2011-12-25 MED ORDER — POTASSIUM CHLORIDE 10 MEQ/100ML IV SOLN
10.0000 meq | INTRAVENOUS | Status: DC
  Administered 2011-12-25: 10 meq via INTRAVENOUS
  Filled 2011-12-25 (×4): qty 100

## 2011-12-25 MED ORDER — SODIUM CHLORIDE 0.9 % IV BOLUS (SEPSIS)
500.0000 mL | Freq: Once | INTRAVENOUS | Status: AC
  Administered 2011-12-25: 500 mL via INTRAVENOUS

## 2011-12-25 MED ORDER — SODIUM CHLORIDE 0.9 % IV SOLN
INTRAVENOUS | Status: DC
  Administered 2011-12-25 – 2011-12-26 (×2): via INTRAVENOUS

## 2011-12-25 MED ORDER — FERROUS SULFATE 325 (65 FE) MG PO TABS
325.0000 mg | ORAL_TABLET | Freq: Two times a day (BID) | ORAL | Status: DC
  Administered 2011-12-25 – 2011-12-27 (×5): 325 mg via ORAL
  Filled 2011-12-25 (×9): qty 1

## 2011-12-25 MED ORDER — POTASSIUM CHLORIDE 10 MEQ/100ML IV SOLN
10.0000 meq | INTRAVENOUS | Status: AC
  Administered 2011-12-25 – 2011-12-26 (×3): 10 meq via INTRAVENOUS
  Filled 2011-12-25 (×3): qty 100

## 2011-12-25 NOTE — Progress Notes (Addendum)
INITIAL ADULT NUTRITION ASSESSMENT Date: 12/25/2011   Time: 1:24 PM Reason for Assessment: Nutrition risk   ASSESSMENT: Female 78 y.o.  Dx: Colitis  Food/Nutrition Related Hx: Pt with a fall in March 2013 with resulting compression fracture of C-spine. Pt reports she has not really eaten well since then. Pt was hospitalized at [facility] when she had the fall and then completed 5 weeks of rehabilitation and went home Friday PTA. Pt reports when she had the fall something happened that "messed up" her esophagus. Pt with history of aspiration pneumonia and said that she was followed by an SLP at [facility] and at the rehab facility and her swallowing function has improved. Pt states she did not eat well at [facility] because she was on puree food. Pt states she ate probably 1 meal/day at rehab because she did not have an appetite. Pt denies any problems chewing and states she has an upper partial denture.  Pt reports having dry heaves for the past 2 weeks. Pt with constant nausea since 12/20/11 with abdominal pain however pt denies any nausea or abdominal pain today. Pt positive for C. Difficile, however reports diarrhea improved with only 3-4 episodes so far today. Pt reports the potassium she is getting is making liquids taste bitter to her except for juice. Pt with history of vitamin B 12 deficiency but states she was not on medication for this at [facility] or at the rehab facility. Vitamin B 12 level yesterday was above normal limits. Pt states her usual weight is 169 pounds which indicates a 15 pound unintended weight loss in the past 3 months. Noted pt's current weight reported at 178 pounds, however pt with +1 RLE and LLE edema per nursing. Pt with neck brace but she says it is not preventing her from eating and she is able to feed herself. Pt reports not eating much of her breakfast because she is tired of hospital food. Pt interested in getting Ensure - will order.   Hx:  Past Medical History  Diagnosis Date    . 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 Resolved. --dx'd on cath 2005. EF 29% with minimal distal CAD (small OM1 70-80).  Echo 10/09 EF 55%  . Hypertension     SEVERE  . Idiopathic thrombocytopenic purpura (ITP)     DR. Jamse Arn  . Granuloma annulare   . History of phlebitis   . Lower extremity edema   . Osteoporosis   . Colitis, ischemic   . Fall     with non-healing rib fractures  . Lumbar stenosis 2004    DR. MARK ROY  . AV block, 1st degree   . Edema   . Complication of anesthesia     hard to wake up  . MI (myocardial infarction)     Non ST elevation 2005 per pt  . Compression fracture of C-spine 10/10/2011    Fell at home, tx at [place]:  Scheduled Meds:   . acetaminophen  650 mg Oral Once  . benazepril  20 mg Oral Daily  . beta carotene w/minerals  1 tablet Oral Daily  . brimonidine  1 drop Right Eye BID  . carvedilol  25 mg Oral BID WC  . ciprofloxacin  400 mg Intravenous Q12H  . fluticasone  2 spray Each Nare Daily  . metronidazole  500 mg Intravenous Q8H  . pantoprazole  40 mg Oral Q1200  . polyvinyl alcohol  1 drop Both Eyes BID  . potassium  chloride  10 mEq Intravenous Q1 Hr x 4  . potassium chloride  10 mEq Intravenous Q1 Hr x 6  . potassium chloride  10 mEq Intravenous Q4H  . potassium chloride  60 mEq Oral Once  . sodium chloride  1,000 mL Intravenous Once  . sodium chloride  500 mL Intravenous Once  . sodium chloride  3 mL Intravenous Q12H  . timolol  1 drop Right Eye BID  . DISCONTD: potassium chloride  10 mEq Intravenous Q1 Hr x 4   Continuous Infusions:   . sodium chloride 50 mL/hr at 12/25/11 1108   PRN Meds:.diphenhydrAMINE, HYDROmorphone (DILAUDID) injection, promethazine  Ht: 5\' 6"  (167.6 cm)  Wt: 178 lb 8 oz (80.967 kg) with +1 RLE and LLE edema  Ideal Wt: 130 lb % Ideal Wt: 137  Usual Wt: 184 lb in March 2013 before pt's fall % Usual Wt: 97  Wt Readings from Last 3 Encounters:  12/25/11 178 lb 8  oz (80.967 kg)  12/22/11 169 lb (76.658 kg)  09/25/11 184 lb (83.462 kg)    Body mass index is 28.81 kg/(m^2).   Labs:  CMP     Component Value Date/Time   NA 136 12/25/2011 0525   K 3.0* 12/25/2011 0525   CL 106 12/25/2011 0525   CO2 21 12/25/2011 0525   GLUCOSE 102* 12/25/2011 0525   GLUCOSE 97 05/07/2006 1443   BUN 23 12/25/2011 0525   CREATININE 1.56* 12/25/2011 0525   CALCIUM 7.8* 12/25/2011 0525   PROT 5.1* 12/23/2011 0244   ALBUMIN 2.6* 12/23/2011 0244   AST 14 12/23/2011 0244   ALT 7 12/23/2011 0244   ALKPHOS 92 12/23/2011 0244   BILITOT 0.7 12/23/2011 0244   GFRNONAA 29* 12/25/2011 0525   GFRAA 33* 12/25/2011 0525    Intake/Output Summary (Last 24 hours) at 12/25/11 1410 Last data filed at 12/25/11 1408  Gross per 24 hour  Intake   1340 ml  Output      0 ml  Net   1340 ml   Last BM - 12/25/11  Diet Order: Dysphagia 3, thin liquid   IVF:    sodium chloride Last Rate: 50 mL/hr at 12/25/11 1108    Estimated Nutritional Needs:   Kcal: 1550-1800 Protein: 95-105g Fluid: 1.5-1.8L  NUTRITION DIAGNOSIS: -Inadequate oral intake (NI-2.1).  Status: Ongoing -Pt meets criteria for severe PCM of chronic illness AEB 8.1% weight loss in the past 3 months with <75% energy intake for the past month with <50% energy intake for the past week   RELATED TO: poor appetite, C. Difficile   AS EVIDENCE BY: <50% meal intake  MONITORING/EVALUATION(Goals): Pt to consume >75% of meals/supplements  EDUCATION NEEDS: -Education needs addressed - encouraged small frequent high calorie/protein meals/snacks/beverages/supplements   INTERVENTION: Ensure Complete BID. Recommend Florastor to help with diarrhea. Will monitor.   Dietitian #: [phone]  [place] Per approved criteria  -Severe malnutrition in the context of chronic illness    Glory Rosebush 12/25/2011, 1:24 PM

## 2011-12-25 NOTE — Progress Notes (Signed)
Patient potassium came back critical at 0120.it was 2.7.  MD gave to orders to address low potassium level during the previous shift. Will give remaining potassium per md order.

## 2011-12-25 NOTE — Progress Notes (Signed)
Patient ID: [name], female   DOB: [birth year], 78 y.o.   MRN: [id number]  [facility] PROGRESS NOTE  MILANYA MUKHERJEE [id number] DOB: May 06, 1923 DOA: 12/22/2011 PCP: Unice Cobble, MD   Antibiotics:  Flagyl 12/22/2011 -->  HPI/Subjective: Pt reports feeling better, no events overnight.  Objective: Filed Vitals:   12/25/11 0700 12/25/11 0900 12/25/11 1100 12/25/11 1408  BP: 110/65 136/85 146/76 129/72  Pulse: 54 55 59 59  Temp: 97.5 F (36.4 C)   97.6 F (36.4 C)  TempSrc: Oral   Oral  Resp: 16   18  Height:      Weight: 80.967 kg (178 lb 8 oz)     SpO2: 94%   99%    Intake/Output Summary (Last 24 hours) at 12/25/11 1620 Last data filed at 12/25/11 1408  Gross per 24 hour  Intake   1340 ml  Output      0 ml  Net   1340 ml    Exam:   General:  Pt is alert, follows commands appropriately, not in acute distress  Cardiovascular: Regular rate and rhythm, S1/S2, no murmurs, no rubs, no gallops  Respiratory: Clear to auscultation bilaterally, no wheezing, no crackles, no rhonchi  Abdomen: Soft, non tender, non distended, bowel sounds present, no guarding  Extremities: No edema, pulses DP and PT palpable bilaterally  Neuro: Grossly nonfocal  Data Reviewed: Basic Metabolic Panel:  Lab [id number] 12/24/11 1415 12/24/11 0432 12/23/11 0244 12/22/11 1645  NA 136 137 136 134* 139  K 3.0* 3.1* 2.6* 2.9* --  CL 106 104 103 100 104  CO2 21 23 21 23 22   GLUCOSE 102* 130* 95 106* 105*  BUN 23 25* 24* 16 17  CREATININE 1.56* 1.53* 1.56* 0.97 0.93  CALCIUM 7.8* 7.7* 7.6* 7.7* 8.2*   Liver Function Tests:  Lab 12/23/11 0244 12/23/11 0240  AST 14 14  ALT 7 8  ALKPHOS 92 92  BILITOT 0.7 0.7  PROT 5.1* 5.1*  ALBUMIN 2.6* 2.6*   CBC:  Lab 12/25/11 0525 12/24/11 0432 12/23/11 0244 12/22/11 1645  WBC 8.1 10.5 15.9* 14.0*  HGB 9.4* 8.5* 10.2* 10.5*  HCT 28.2* 25.2* 29.8* 30.6*  MCV 96.6 96.6 96.4 96.2  PLT 143* 116* 103* 107*   Cardiac  Enzymes:  Lab 12/24/11 2335 12/23/11 1450 12/23/11 0825 12/23/11 0240 12/22/11 1645  CKTOTAL 11 24 17 20  --  CKMB 1.5 1.5 1.4 1.3 --  CKMBINDEX -- -- -- -- --  TROPONINI <0.30 <0.30 <0.30 <0.30 <0.30   C Diff POSITIVE   Studies:  US Renal 12/24/2011    IMPRESSION:   Bilateral renal cortical thinning.  No hydronephrosis.    Scheduled Meds:   . acetaminophen  650 mg Oral Once  . benazepril  20 mg Oral Daily  . beta carotene w/minerals  1 tablet Oral Daily  . brimonidine  1 drop Right Eye BID  . carvedilol  25 mg Oral BID WC  . famotidine  20 mg Oral Daily  . feeding supplement  237 mL Oral BID BM  . fluticasone  2 spray Each Nare Daily  . metronidazole  500 mg Intravenous Q8H  . polyvinyl alcohol  1 drop Both Eyes BID  . potassium chloride  10 mEq Intravenous Q1 Hr x 4  . potassium chloride  10 mEq Intravenous Q1 Hr x 6  . potassium chloride  10 mEq Intravenous Q4H  . timolol  1 drop Right Eye BID   Continuous Infusions:   .  sodium chloride 50 mL/hr at 12/25/11 1108     Assessment/Plan:  Principal Problem:  *Colitis  - please note that C. Diff is positive  - will continue Flagyl as noted above, d/c Ciprofloxacin - continue to provide supportive care with IVF and antiemetics as needed  - supplement electrolytes as indicated   Hypokalemia  - secondary to diarrhea  - slow recovery since pt refusing PO form of supplementations and wants to have KCl runs at low rate due to pain  - will supplement as indicated and will check BMP in AM   Hyponatremia  - secondary to volume loss  - now resolved  - BMP in AM   Anemia of chronic disease  - Hg and Hct stable - FOBT positive - will continue to monitor - CBC in AM   Leukocytosis  - secondary to colitis  - now resolved  - will continue antibiotic as noted above  - CBC in AM   Thrombocytopenia  - likely reactive  - no signs of acute bleeding  - now improving and at pt's baseline  - CBC in AM   Acute renal  failure  - unclear etiology at this time  - will check urine sodium and creatinine  - renal US unremarkable for acute findings - continue IVF  - BMP in AM  Code Status: DNR Family Communication: Pt and son at bedside Disposition Plan: Plan to d/c in 2-3 days  Faye Ramsay, MD  [facility] Pager [phone]  If 7PM-7AM, please contact night-coverage [email or web address] Password [email or web address] 12/25/2011, 4:20 PM   LOS: 3 days

## 2011-12-25 NOTE — Evaluation (Signed)
Physical Therapy Evaluation Patient Details Name: [name] MRN: [id number] DOB: 1923/04/04 Today's Date: 12/25/2011 Time: [id number] PT Time Calculation (min): 25 min  PT Assessment / Plan / Recommendation Clinical Impression  78 y.o. female admitted with N/V/D.  Pt previously had 3 cervical compression fractures and had completed 4 weeks of rehab.  She was home just a few days before being admitted here. Pt ambulates well with RW, no LOB. Expect pt can return home with HHPT, pt has needed DME.     PT Assessment  Patient needs continued PT services    Follow Up Recommendations  Home health PT    Barriers to Discharge None      lEquipment Recommendations  None recommended by PT    Recommendations for Other Services OT consult   Frequency Min 3X/week    Precautions / Restrictions Precautions Precautions: None Required Braces or Orthoses: Cervical Brace Cervical Brace: Hard collar Restrictions Weight Bearing Restrictions: No   Pertinent Vitals/Pain *no c/o pain**      Mobility  Transfers Transfers: Sit to Stand;Stand to Sit Sit to Stand: From toilet;3: Mod assist Stand to Sit: To chair/3-in-1 Ambulation/Gait Ambulation/Gait Assistance: 5: Supervision Ambulation Distance (Feet): 80 Feet Assistive device: Rolling walker Ambulation/Gait Assistance Details: with cervical brace Gait Pattern: Within Functional Limits Stairs: No Wheelchair Mobility Wheelchair Mobility: No    Exercises     PT Diagnosis: Generalized weakness  PT Problem List: Decreased activity tolerance PT Treatment Interventions: Gait training;Functional mobility training;Therapeutic activities;Patient/family education   PT Goals Acute Rehab PT Goals PT Goal Formulation: With patient Time For Goal Achievement: 01/01/12 Potential to Achieve Goals: Good Pt will go Supine/Side to Sit: with modified independence;with HOB 0 degrees PT Goal: Supine/Side to Sit - Progress: Goal set today Pt will  go Sit to Stand: with modified independence PT Goal: Sit to Stand - Progress: Goal set today Pt will Ambulate: 51 - 150 feet;with rolling walker PT Goal: Ambulate - Progress: Goal set today  Visit Information  Last PT Received On: 12/25/11 Assistance Needed: +1    Subjective Data  Subjective: I was in rehab 5 weeks, then went home for a few days, then came here.  Patient Stated Goal: DC home   Prior Functioning  Home Living Lives With: Alone Available Help at Discharge: Home health Home Access: Level entry Home Layout: One level Bathroom Shower/Tub: Multimedia programmer: Standard Home Adaptive Equipment: Raised toilet seat with rails;Walker - rolling;Built-in shower seat;Straight cane Prior Function Level of Independence: Independent with assistive device(s) Communication Communication: No difficulties    Cognition  Overall Cognitive Status: Appears within functional limits for tasks assessed/performed Arousal/Alertness: Awake/alert Orientation Level: Appears intact for tasks assessed Behavior During Session: [facility] for tasks performed    Extremity/Trunk Assessment Right Upper Extremity Assessment RUE ROM/Strength/Tone: [facility] for tasks assessed Left Upper Extremity Assessment LUE ROM/Strength/Tone: WFL for tasks assessed Right Lower Extremity Assessment RLE ROM/Strength/Tone: Within functional levels RLE Sensation: WFL - Light Touch RLE Coordination: WFL - gross/fine motor Left Lower Extremity Assessment LLE ROM/Strength/Tone: Within functional levels LLE Sensation: WFL - Light Touch LLE Coordination: WFL - gross/fine motor Trunk Assessment Trunk Assessment: Normal   Balance    End of Session PT - End of Session Equipment Utilized During Treatment: Cervical collar Patient left: in bed   Blondell Reveal Kistler 12/25/2011, 1:25 PM [id number]

## 2011-12-26 DIAGNOSIS — R1084 Generalized abdominal pain: Secondary | ICD-10-CM

## 2011-12-26 DIAGNOSIS — R000 Tachycardia, unspecified: Secondary | ICD-10-CM

## 2011-12-26 DIAGNOSIS — R112 Nausea with vomiting, unspecified: Secondary | ICD-10-CM

## 2011-12-26 DIAGNOSIS — R197 Diarrhea, unspecified: Secondary | ICD-10-CM

## 2011-12-26 LAB — CBC
HCT: 26.7 % — ABNORMAL LOW (ref 36.0–46.0)
Hemoglobin: 9 g/dL — ABNORMAL LOW (ref 12.0–15.0)
MCH: 32.4 pg (ref 26.0–34.0)
MCHC: 33.7 g/dL (ref 30.0–36.0)
MCV: 96 fL (ref 78.0–100.0)
RBC: 2.78 MIL/uL — ABNORMAL LOW (ref 3.87–5.11)

## 2011-12-26 LAB — BASIC METABOLIC PANEL
BUN: 18 mg/dL (ref 6–23)
CO2: 19 mEq/L (ref 19–32)
Calcium: 7.7 mg/dL — ABNORMAL LOW (ref 8.4–10.5)
GFR calc non Af Amer: 35 mL/min — ABNORMAL LOW (ref >90)
Glucose, Bld: 97 mg/dL (ref 70–99)

## 2011-12-26 MED ORDER — TIMOLOL MALEATE 0.25 % OP SOLN
1.0000 [drp] | Freq: Two times a day (BID) | OPHTHALMIC | Status: DC
  Administered 2011-12-26 – 2011-12-28 (×4): 1 [drp] via OPHTHALMIC
  Filled 2011-12-26: qty 5

## 2011-12-26 MED ORDER — MAGNESIUM SULFATE 40 MG/ML IJ SOLN
2.0000 g | Freq: Once | INTRAMUSCULAR | Status: AC
  Administered 2011-12-26: 2 g via INTRAVENOUS
  Filled 2011-12-26: qty 50

## 2011-12-26 MED ORDER — POTASSIUM CHLORIDE 10 MEQ/100ML IV SOLN
10.0000 meq | INTRAVENOUS | Status: AC
  Administered 2011-12-26 (×4): 10 meq via INTRAVENOUS
  Filled 2011-12-26 (×4): qty 100

## 2011-12-26 NOTE — Progress Notes (Signed)
Patient ID: [name], female   DOB: 1923/04/20, 78 y.o.   MRN: [id number]  [facility] PROGRESS NOTE  [name] [id number] DOB: 02-10-1923 DOA: 12/22/2011 PCP: Unice Cobble, MD  Antibiotics:  Flagyl 12/22/2011 -->  HPI/Subjective:  Pt reports feeling better, no events overnight.  Objective: Filed Vitals:   12/26/11 0100 12/26/11 0423 12/26/11 0925 12/26/11 1535  BP: 107/65 121/66 128/71 118/67  Pulse: 56 57 62 60  Temp: 98.1 F (36.7 C) 97.9 F (36.6 C)  98.5 F (36.9 C)  TempSrc: Oral Oral  Oral  Resp: 18 20  20   Height:      Weight:      SpO2: 96% 96%  97%    Intake/Output Summary (Last 24 hours) at 12/26/11 1617 Last data filed at 12/26/11 1536  Gross per 24 hour  Intake   1150 ml  Output    150 ml  Net   1000 ml    Exam:   General:  Pt is alert, follows commands appropriately, not in acute distress  Cardiovascular: Regular rhythm, bradycardic, S1/S2, no murmurs, no rubs, no gallops  Respiratory: Clear to auscultation bilaterally but slightly decreased at bases, no wheezing, no crackles, no rhonchi  Abdomen: Soft, non tender, non distended, bowel sounds present, no guarding  Extremities: No edema, pulses DP and PT palpable bilaterally  Neuro: Grossly nonfocal  Data Reviewed: Basic Metabolic Panel:  Lab [id number] 12/25/11 0525 12/24/11 1415 12/24/11 0432 12/23/11 0244  NA 134* 136 137 136 134*  K 3.1* 3.0* 3.1* 2.6* --  CL 107 106 104 103 100  CO2 19 21 23 21 23   GLUCOSE 97 102* 130* 95 106*  BUN 18 23 25* 24* 16  CREATININE 1.32* 1.56* 1.53* 1.56* 0.97  CALCIUM 7.7* 7.8* 7.7* 7.6* 7.7*  MG 1.3* -- -- -- --   Liver Function Tests:  Lab 12/23/11 0244 12/23/11 0240  AST 14 14  ALT 7 8  ALKPHOS 92 92  BILITOT 0.7 0.7  PROT 5.1* 5.1*  ALBUMIN 2.6* 2.6*    Lab 12/23/11 0240  LIPASE 7*  AMYLASE --   CBC:  Lab 12/26/11 0508 12/25/11 0525 12/24/11 0432 12/23/11 0244 12/22/11 1645  WBC 5.5 8.1 10.5 15.9* 14.0*    HGB 9.0* 9.4* 8.5* 10.2* 10.5*  HCT 26.7* 28.2* 25.2* 29.8* 30.6*  MCV 96.0 96.6 96.6 96.4 96.2  PLT 164 143* 116* 103* 107*   Cardiac Enzymes:  Lab 12/24/11 2335 12/23/11 1450 12/23/11 0825 12/23/11 0240 12/22/11 1645  CKTOTAL 11 24 17 20  --  CKMB 1.5 1.5 1.4 1.3 --  CKMBINDEX -- -- -- -- --  TROPONINI <0.30 <0.30 <0.30 <0.30 <0.30    Recent Results (from the past 240 hour(s))  CLOSTRIDIUM DIFFICILE BY PCR     Status: Abnormal   Collection Time   12/22/11  8:54 PM      Component Value Range Status Comment   C difficile by pcr POSITIVE (*) NEGATIVE Final   CULTURE, BLOOD (ROUTINE X 2)     Status: Normal (Preliminary result)   Collection Time   12/23/11  1:55 AM      Component Value Range Status Comment   Specimen Description BLOOD RIGHT ANTECUBITAL   Final    Special Requests BOTTLES DRAWN AEROBIC AND ANAEROBIC 5CC   Final    Culture  Setup Time [id number]   Final    Culture     Final    Value:  BLOOD CULTURE RECEIVED NO GROWTH TO DATE CULTURE WILL BE HELD FOR 5 DAYS BEFORE ISSUING A FINAL NEGATIVE REPORT   Report Status PENDING   Incomplete   CULTURE, BLOOD (ROUTINE X 2)     Status: Normal (Preliminary result)   Collection Time   12/23/11  2:05 AM      Component Value Range Status Comment   Specimen Description BLOOD R HAND   Final    Special Requests Normal BOTTLES DRAWN AEROBIC ONLY 2CC   Final    Culture  Setup Time [id number]   Final    Culture     Final    Value:        BLOOD CULTURE RECEIVED NO GROWTH TO DATE CULTURE WILL BE HELD FOR 5 DAYS BEFORE ISSUING A FINAL NEGATIVE REPORT   Report Status PENDING   Incomplete   STOOL CULTURE     Status: Normal (Preliminary result)   Collection Time   12/24/11 11:47 AM      Component Value Range Status Comment   Specimen Description STOOL   Final    Special Requests NONE   Final    Culture NO SUSPICIOUS COLONIES, CONTINUING TO HOLD   Final    Report Status PENDING   Incomplete      Studies: No results  found.  Scheduled Meds:   . acetaminophen  650 mg Oral Once  . benazepril  20 mg Oral Daily  . beta carotene w/minerals  1 tablet Oral Daily  . brimonidine  1 drop Right Eye BID  . carvedilol  25 mg Oral BID WC  . famotidine  20 mg Oral Daily  . feeding supplement  237 mL Oral BID BM  . ferrous sulfate  325 mg Oral BID WC  . fluticasone  2 spray Each Nare Daily  . metronidazole  500 mg Intravenous Q8H  . polyvinyl alcohol  1 drop Both Eyes BID  . potassium chloride  10 mEq Intravenous Q4H  . potassium chloride  10 mEq Intravenous Q1 Hr x 4  . potassium chloride  60 mEq Oral Once  . sodium chloride  1,000 mL Intravenous Once  . sodium chloride  3 mL Intravenous Q12H  . timolol  1 drop Right Eye BID  . DISCONTD: timolol  1 drop Right Eye BID   Continuous Infusions:   . sodium chloride 50 mL/hr at 12/25/11 1108     Assessment/Plan:  Principal Problem:  *Colitis  - please note that C. Diff is positive  - will continue Flagyl as noted above, d/c Ciprofloxacin  - continue to provide supportive care with IVF and antiemetics as needed  - supplement electrolytes as indicated   Hypokalemia  - secondary to diarrhea  - slow recovery since pt refusing PO form of supplementations and wants to have KCl runs at low rate due to pain  - will supplement as indicated and will check BMP in AM   Hypomagnesemia - will supplement - check Mg in AM  Hyponatremia  - secondary to volume loss  - now resolved  - BMP in AM   Anemia of chronic disease  - Hg and Hct stable  - FOBT positive  - will continue to monitor  - CBC in AM   Leukocytosis  - secondary to colitis  - now resolved  - will continue antibiotic as noted above  - CBC in AM   Thrombocytopenia  - likely reactive  - no signs of acute bleeding  - now improving and  at pt's baseline  - CBC in AM   Acute renal failure  - unclear etiology at this time  - creatinine trending down - renal US unremarkable for acute  findings  - continue IVF  - BMP in AM   Code Status: DNR  Family Communication: Pt and son at bedside  Disposition Plan: Plan to d/c in 2-3 days   Faye Ramsay, MD  [facility] Pager [phone]  If 7PM-7AM, please contact night-coverage [email or web address] Password [email or web address] 12/26/2011, 4:17 PM   LOS: 4 days

## 2011-12-27 DIAGNOSIS — R000 Tachycardia, unspecified: Secondary | ICD-10-CM

## 2011-12-27 DIAGNOSIS — R112 Nausea with vomiting, unspecified: Secondary | ICD-10-CM

## 2011-12-27 DIAGNOSIS — R197 Diarrhea, unspecified: Secondary | ICD-10-CM

## 2011-12-27 DIAGNOSIS — R1084 Generalized abdominal pain: Secondary | ICD-10-CM

## 2011-12-27 LAB — CBC
HCT: 27.9 % — ABNORMAL LOW (ref 36.0–46.0)
MCH: 32.2 pg (ref 26.0–34.0)
MCV: 95.5 fL (ref 78.0–100.0)
Platelets: 165 10*3/uL (ref 150–400)
RDW: 14.1 % (ref 11.5–15.5)

## 2011-12-27 LAB — BASIC METABOLIC PANEL
BUN: 14 mg/dL (ref 6–23)
CO2: 21 mEq/L (ref 19–32)
Calcium: 8.3 mg/dL — ABNORMAL LOW (ref 8.4–10.5)
Chloride: 109 mEq/L (ref 96–112)
Creatinine, Ser: 1.19 mg/dL — ABNORMAL HIGH (ref 0.50–1.10)
GFR calc Af Amer: 46 mL/min — ABNORMAL LOW (ref >90)

## 2011-12-27 LAB — MAGNESIUM: Magnesium: 1.8 mg/dL (ref 1.5–2.5)

## 2011-12-27 NOTE — Progress Notes (Signed)
Patient ID: [name], female   DOB: 22-Nov-1922, 78 y.o.   MRN: [id number]  [facility] PROGRESS NOTE  [name] [id number] DOB: 25-Jun-1923 DOA: 12/22/2011 PCP: Unice Cobble, MD   Consultants:  None  Procedures:  None  Antibiotics:  Flagyl 12/22/2011 -->   HPI/Subjective: Pt feels better this AM, no events overnight.  Objective: Filed Vitals:   12/27/11 0128 12/27/11 0717 12/27/11 1047 12/27/11 1300  BP: 104/59 170/77 124/69 128/73  Pulse: 56 64 60 67  Temp: 98.3 F (36.8 C) 98.2 F (36.8 C)  98.5 F (36.9 C)  TempSrc: Oral Oral    Resp: 20 20  19   Height:      Weight:      SpO2: 96% 98%  99%    Intake/Output Summary (Last 24 hours) at 12/27/11 1506 Last data filed at 12/27/11 1435  Gross per 24 hour  Intake 1952.5 ml  Output      0 ml  Net 1952.5 ml    Exam:   General:  Pt is alert, follows commands appropriately, not in acute distress  Cardiovascular: Regular rate and rhythm, S1/S2, no murmurs, no rubs, no gallops  Respiratory: Clear to auscultation bilaterally, no wheezing, no crackles, no rhonchi  Abdomen: Soft, non tender, non distended, bowel sounds present, no guarding  Extremities: No edema, pulses DP and PT palpable bilaterally  Neuro: Grossly nonfocal  Data Reviewed: Basic Metabolic Panel:  Lab [id number] 12/26/11 0508 12/25/11 0525 12/24/11 2335 12/24/11 1415 12/24/11 0432  NA 137 134* 136 -- 137 136  K 3.7 3.1* 3.0* 2.7* 3.1* --  CL 109 107 106 -- 104 103  CO2 21 19 21  -- 23 21  GLUCOSE 101* 97 102* -- 130* 95  BUN 14 18 23  -- 25* 24*  CREATININE 1.19* 1.32* 1.56* -- 1.53* 1.56*  CALCIUM 8.3* 7.7* 7.8* -- 7.7* 7.6*  MG 1.8 1.3* -- -- -- --  PHOS -- -- -- -- -- --   Liver Function Tests:  Lab 12/23/11 0244 12/23/11 0240  AST 14 14  ALT 7 8  ALKPHOS 92 92  BILITOT 0.7 0.7  PROT 5.1* 5.1*  ALBUMIN 2.6* 2.6*    Lab 12/23/11 0240  LIPASE 7*  AMYLASE --   CBC:  Lab 12/27/11 0520 12/26/11  0508 12/25/11 0525 12/24/11 0432 12/23/11 0244  WBC 5.6 5.5 8.1 10.5 15.9*  NEUTROABS -- -- -- -- 14.2*  HGB 9.4* 9.0* 9.4* 8.5* 10.2*  HCT 27.9* 26.7* 28.2* 25.2* 29.8*  MCV 95.5 96.0 96.6 96.6 96.4  PLT 165 164 143* 116* 103*   Cardiac Enzymes:  Lab 12/24/11 2335 12/23/11 1450 12/23/11 0825 12/23/11 0240 12/22/11 1645  CKTOTAL 11 24 17 20  --  CKMB 1.5 1.5 1.4 1.3 --  CKMBINDEX -- -- -- -- --  TROPONINI <0.30 <0.30 <0.30 <0.30 <0.30     Recent Results (from the past 240 hour(s))  CLOSTRIDIUM DIFFICILE BY PCR     Status: Abnormal   Collection Time   12/22/11  8:54 PM      Component Value Range Status Comment   C difficile by pcr POSITIVE (*) NEGATIVE Final   CULTURE, BLOOD (ROUTINE X 2)     Status: Normal (Preliminary result)   Collection Time   12/23/11  1:55 AM      Component Value Range Status Comment   Specimen Description BLOOD RIGHT ANTECUBITAL   Final    Special Requests BOTTLES DRAWN AEROBIC AND ANAEROBIC 5CC   Final  Culture  Setup Time [id number]   Final    Culture     Final    Value:        BLOOD CULTURE RECEIVED NO GROWTH TO DATE CULTURE WILL BE HELD FOR 5 DAYS BEFORE ISSUING A FINAL NEGATIVE REPORT   Report Status PENDING   Incomplete   CULTURE, BLOOD (ROUTINE X 2)     Status: Normal (Preliminary result)   Collection Time   12/23/11  2:05 AM      Component Value Range Status Comment   Specimen Description BLOOD R HAND   Final    Special Requests Normal BOTTLES DRAWN AEROBIC ONLY 2CC   Final    Culture  Setup Time [id number]   Final    Culture     Final    Value:        BLOOD CULTURE RECEIVED NO GROWTH TO DATE CULTURE WILL BE HELD FOR 5 DAYS BEFORE ISSUING A FINAL NEGATIVE REPORT   Report Status PENDING   Incomplete   STOOL CULTURE     Status: Normal (Preliminary result)   Collection Time   12/24/11 11:47 AM      Component Value Range Status Comment   Specimen Description STOOL   Final    Special Requests NONE   Final    Culture NO SUSPICIOUS COLONIES,  CONTINUING TO HOLD   Final    Report Status PENDING   Incomplete      Studies: No results found.  Scheduled Meds:   . acetaminophen Oral Once  . benazepril Oral Daily  . beta carotene w/minerals Oral Daily  . brimonidine Right Eye BID  . carvedilol Oral BID WC  . famotidine Oral Daily  . feeding supplement Oral BID BM  . ferrous sulfate Oral BID WC  . fluticasone Each Nare Daily  . metronidazole Intravenous Q8H  . polyvinyl alcohol Both Eyes BID  . potassium chloride Intravenous Q1 Hr x 4  . sodium chloride Intravenous Q12H  . timolol Right Eye BID   Continuous Infusions:   . sodium chloride 50 mL/hr at 12/26/11 1736     Assessment/Plan:  Principal Problem:  *Colitis  - please note that C. Diff is positive  - pt clinically improved - will continue Flagyl as noted above - continue to provide supportive care with IVF and antiemetics as needed  - supplement electrolytes as indicated   Hypokalemia  - secondary to diarrhea  - slow recovery since pt refusing PO form of supplementations and wants to have KCl runs at low rate due to pain  - now resolved and within normal limits this AM - will supplement as indicated and will check BMP in AM   Hypomagnesemia  - supplemented   Hyponatremia  - secondary to volume loss  - now resolved  - BMP in AM   Anemia of chronic disease  - Hg and Hct stable  - FOBT positive  - will continue to monitor  - CBC in AM   Leukocytosis  - secondary to colitis  - now resolved  - will continue antibiotic as noted above  - CBC in AM   Thrombocytopenia  - likely reactive  - no signs of acute bleeding  - now improving and at pt's baseline  - CBC in AM   Acute renal failure  - unclear etiology at this time  - creatinine trending down  - renal US unremarkable for acute findings  - BMP in AM   Code Status: DNR  Family  Communication: Pt and son at bedside  Disposition Plan: Plan to d/c in 2-3 days  Faye Ramsay,  MD  [facility] Pager [phone]  If 7PM-7AM, please contact night-coverage [email or web address] Password [email or web address] 12/27/2011, 3:06 PM   LOS: 5 days

## 2011-12-27 NOTE — Progress Notes (Signed)
Cm spoke with pt concerning dc planning. Pt states active with [facility]. Per pt choice [facility] to continue to provide [facility] services upon discharge. No Dme needs stated. Per pt adult daughter to assist in home care. [facility] notified of potential discharge 12/28/11. PCP Dr.Hopper.   Arlean Hopping [phone]

## 2011-12-28 DIAGNOSIS — R197 Diarrhea, unspecified: Secondary | ICD-10-CM

## 2011-12-28 DIAGNOSIS — R1084 Generalized abdominal pain: Secondary | ICD-10-CM

## 2011-12-28 DIAGNOSIS — R112 Nausea with vomiting, unspecified: Secondary | ICD-10-CM

## 2011-12-28 DIAGNOSIS — R000 Tachycardia, unspecified: Secondary | ICD-10-CM

## 2011-12-28 LAB — BASIC METABOLIC PANEL
BUN: 11 mg/dL (ref 6–23)
BUN: 10 mg/dL (ref 6–23)
Calcium: 7.8 mg/dL — ABNORMAL LOW (ref 8.4–10.5)
Calcium: 8 mg/dL — ABNORMAL LOW (ref 8.4–10.5)
Creatinine, Ser: 1.04 mg/dL (ref 0.50–1.10)
GFR calc Af Amer: 53 mL/min — ABNORMAL LOW (ref >90)
GFR calc Af Amer: 54 mL/min — ABNORMAL LOW (ref >90)
GFR calc non Af Amer: 46 mL/min — ABNORMAL LOW (ref >90)
GFR calc non Af Amer: 47 mL/min — ABNORMAL LOW (ref >90)
Glucose, Bld: 94 mg/dL (ref 70–99)
Potassium: 3.3 mEq/L — ABNORMAL LOW (ref 3.5–5.1)

## 2011-12-28 LAB — STOOL CULTURE

## 2011-12-28 LAB — CBC
HCT: 25.8 % — ABNORMAL LOW (ref 36.0–46.0)
Hemoglobin: 8.8 g/dL — ABNORMAL LOW (ref 12.0–15.0)
MCH: 32.8 pg (ref 26.0–34.0)
MCHC: 34.1 g/dL (ref 30.0–36.0)
RDW: 14.1 % (ref 11.5–15.5)

## 2011-12-28 MED ORDER — METRONIDAZOLE 500 MG PO TABS: 500.0000 mg | ORAL_TABLET | Freq: Three times a day (TID) | ORAL | Status: DC

## 2011-12-28 MED ORDER — POTASSIUM CHLORIDE 10 MEQ/100ML IV SOLN
10.0000 meq | INTRAVENOUS | Status: AC
  Administered 2011-12-28: 10 meq via INTRAVENOUS
  Filled 2011-12-28 (×4): qty 100

## 2011-12-28 NOTE — Discharge Summary (Signed)
Physician Discharge Summary  TYAUNA EDMUNDS [id number] DOB: [birth year] DOA: 12/22/2011  PCP: Unice Cobble, MD  Admit date: 12/22/2011 Discharge date: 12/28/2011  Recommendations for Outpatient Follow-up:  1. Pt will need to see PCP in 1-2 weeks post discharge. Please check BMP and magnesium level as this was very low during the hospital stay and has required supplementation over the few days period.   Discharge Diagnoses: C. DIff Colitis Principal Problem:  *Colitis  Discharge Condition: Stable  Diet recommendation: Regular  History of present illness:  78 yo female with hx of ischemic colitis, htn, svt, cardiomyopathy (likely Tako Tsubo, dx 2005), nonst elevation MI 2005, c/o n/v, intermittently while at [facility] and then the n/v, became worse and also started having some abdominal pain, today. diffuse, dull ache, fairly constant. Pt had n/v x3-4 times the day of admission, "just dry heaves". Notes subjective fever, chills. Denies constipation, brbpr, black stool. Pt was seen by Dr. Linna Darner the day of admission and was sent to ED for evaluation.   Hospital Course:  Principal Problem:  *Colitis - pt tested positive for C. Difficile colitis - she was started on Flagyl PO 500 mg TID and she wool continue to take the medication upon discharge for additional 10 days - she has responded well to the medical therapy, with IVF, and antiemetics as needed - her diet was advanced and she has tolerated regular diet and has not had any further diarrhea or nausea, vomiting - in addition she was able to ambulate and participate in physical therapy  - her electrolytes were adequately supplemented, specifically potassium adn magnesium  Acute on Chronic renal failure - IVF fluids were provided as this was thought to be mostly pre renal in etiology - creatinine on discharge was within normal limits which is at pt's baseline   Procedures:  none  Discharge Exam: Filed Vitals:   12/28/11 0434  BP: 164/73  Pulse: 60  Temp: 97.9 F (36.6 C)  Resp: 20   Filed Vitals:   12/27/11 1047 12/27/11 1300 12/28/11 0009 12/28/11 0434  BP: 124/69 128/73 110/64 164/73  Pulse: 60 67 56 60  Temp:  98.5 F (36.9 C) 97.7 F (36.5 C) 97.9 F (36.6 C)  TempSrc:   Oral Oral  Resp:  19 20 20   Height:      Weight:    84.5 kg (186 lb 4.6 oz)  SpO2:  99% 96% 96%    General: Pt is alert, follows commands appropriately, not in acute distress Cardiovascular: Regular rate and rhythm, S1/S2 +, no murmurs, no rubs, no gallops Respiratory: Clear to auscultation bilaterally, no wheezing, no crackles, no rhonchi Abdominal: Soft, non tender, non distended, bowel sounds +, no guarding Extremities: no edema, no cyanosis, pulses palpable bilaterally DP and PT Neuro: Grossly nonfocal  Discharge Instructions  Discharge Orders    Future Appointments: Provider: Department: Dept Phone: Center:   01/19/2012 1:00 PM [facility] [phone] None   02/09/2012 2:30 PM Jarvis Morgan [facility] [phone] None   02/09/2012 3:00 PM Nira Retort, MD [facility] [phone] None     Future Orders Please Complete By Expires   Diet - low sodium heart healthy      Increase activity slowly        Medication List  As of 12/28/2011  1:06 PM   TAKE these medications         benazepril 20 MG tablet   Commonly known as: LOTENSIN   Take  20 mg by mouth daily.      beta carotene w/minerals tablet   Take 1 tablet by mouth daily.      brimonidine 0.2 % ophthalmic solution   Commonly known as: ALPHAGAN   Place 1 drop into the right eye 2 (two) times daily.      carvedilol 25 MG tablet   Commonly known as: COREG   Take 25 mg by mouth 2 (two) times daily with a meal.      fluticasone 50 MCG/ACT nasal spray   Commonly known as: FLONASE   USE 1 SPRAY IN EACH NOSTRIL TWICE A DAY      furosemide 40 MG tablet   Commonly known as: LASIX   Take 40 mg by mouth daily  as needed. PT TAKES 1/2 (HALF) TO 1 TABLET DAILY PRN for edema      metroNIDAZOLE 500 MG tablet   Commonly known as: FLAGYL   Take 1 tablet (500 mg total) by mouth 3 (three) times daily.      omeprazole 20 MG capsule   Commonly known as: PRILOSEC   Take 20 mg by mouth daily.      Osteo Bi-Flex Adv Joint Shield Tabs   Take 2 tablets by mouth daily.      REFRESH TEARS 0.5 % Soln   Generic drug: carboxymethylcellulose   Place 1 drop into both eyes 2 (two) times daily.      timolol 0.25 % ophthalmic solution   Commonly known as: TIMOPTIC   Place 1 drop into the right eye 2 (two) times daily.           Follow-up Information    Follow up with Unice Cobble, MD in 2 weeks.   Contact information:   [address] [facility] [phone]           The results of significant diagnostics from this hospitalization (including imaging, microbiology, ancillary and laboratory) are listed below for reference.    Significant Diagnostic Studies: Dg Chest 2 View  12/22/2011  *RADIOLOGY REPORT*  Clinical Data: Nausea vomiting and diarrhea.  History of myocardial infarction.  The patient is in a back brace.  CHEST - 2 VIEW  Comparison: 09/15/2010  Findings: Osteopenia.  Lower thoracic compression deformity has undergone prior augmentation.  Moderate thoracic spondylosis.  Lateral view degraded by patient arm position.  The frontal view is degraded by overlying artifact from the patients back brace.  There are numerous right rib fractures which are nonacute.  Mild cardiomegaly.  Costophrenic angle blunting on the frontal is due to pleural thickening.  No gross pneumothorax. No congestive failure.  Probable pleural thickening adjacent right rib fractures.  Mild bibasilar volume loss.  Apparent nodular density at the left lung base favored to represent an area of scarring and is similar to on the 2012 plain film.  Hyperinflation.  IMPRESSION:  1.   Moderately degraded exam, secondary to the patient's back brace. 2.  Hyperinflation and cardiomegaly, without acute disease.  Original Report Authenticated By: Areta Haber, M.D.   Ct Abdomen Pelvis W Contrast  12/22/2011  *RADIOLOGY REPORT*  Clinical Data: Pain.  CT ABDOMEN AND PELVIS WITH CONTRAST  Technique:  Multidetector CT imaging of the abdomen and pelvis was performed following the standard protocol during bolus administration of intravenous contrast.  Contrast: 179mL OMNIPAQUE IOHEXOL 300 MG/ML  SOLN  Comparison: None.  Findings: Small left effusion.  There is no focal liver abnormality.  The  gallbladder appears normal.  No biliary dilatation.  The pancreas is unremarkable.  Normal appearance of the spleen.  The adrenal glands are both normal. Bilateral renal hypodensities. Likely cysts.  No obstructive uropathy.  The urinary bladder appears normal.  Postoperative changes suggestive of hysterectomy.  There is a moderate size hiatal hernia.  The small bowel loops have a normal course and caliber.  The proximal colon is unremarkable. From the splenic flexure through the rectum there is abnormal bowel wall edema, mucosal enhancement, and pericolonic inflammatory change. No perforation identified.  No significant free fluid or abnormal fluid collections identified.  No pneumatosis identified. The portal vein appears normal and patent.  Contrast opacification of the superior mesenteric artery and inferior mesenteric artery noted.  Review of the visualized osseous structures is significant for multilevel degenerative disc disease.  T12 compression fracture is again noted and appears to have been treated with bone cement.  IMPRESSION: 1.  Examination is positive for acute colitis involving the left colon from the hepatic flexure through the rectum. 2.  No evidence for bowel perforation or abscess formation.  Original Report Authenticated By: Angelita Ingles, M.D.   US Renal  12/24/2011  *RADIOLOGY REPORT*   Clinical Data: Acute renal failure  RENAL/URINARY TRACT ULTRASOUND COMPLETE  Comparison: CT 12/22/2011  Findings:  Right Kidney = 9.4 cm.  Mild cortical thinning.  No hydronephrosis.  Left kidney = 9.7 cm.  Mild cortical thinning.  No hydronephrosis.  Bladder:  Collapsed  IMPRESSION:  Bilateral renal cortical thinning.  No hydronephrosis.  Original Report Authenticated By: Suzy Bouchard, M.D.    Microbiology: Recent Results (from the past 240 hour(s))  CLOSTRIDIUM DIFFICILE BY PCR     Status: Abnormal   Collection Time   12/22/11  8:54 PM      Component Value Range Status Comment   C difficile by pcr POSITIVE (*) NEGATIVE Final   CULTURE, BLOOD (ROUTINE X 2)     Status: Normal (Preliminary result)   Collection Time   12/23/11  1:55 AM      Component Value Range Status Comment   Specimen Description BLOOD RIGHT ANTECUBITAL   Final    Special Requests BOTTLES DRAWN AEROBIC AND ANAEROBIC 5CC   Final    Culture  Setup Time [id number]   Final    Culture     Final    Value:        BLOOD CULTURE RECEIVED NO GROWTH TO DATE CULTURE WILL BE HELD FOR 5 DAYS BEFORE ISSUING A FINAL NEGATIVE REPORT   Report Status PENDING   Incomplete   CULTURE, BLOOD (ROUTINE X 2)     Status: Normal (Preliminary result)   Collection Time   12/23/11  2:05 AM      Component Value Range Status Comment   Specimen Description BLOOD R HAND   Final    Special Requests Normal BOTTLES DRAWN AEROBIC ONLY 2CC   Final    Culture  Setup Time [id number]   Final    Culture     Final    Value:        BLOOD CULTURE RECEIVED NO GROWTH TO DATE CULTURE WILL BE HELD FOR 5 DAYS BEFORE ISSUING A FINAL NEGATIVE REPORT   Report Status PENDING   Incomplete   STOOL CULTURE     Status: Normal   Collection Time   12/24/11 11:47 AM      Component Value Range Status Comment   Specimen Description STOOL   Final  Special Requests NONE   Final    Culture     Final    Value: NO SALMONELLA, SHIGELLA, CAMPYLOBACTER, YERSINIA, OR E.COLI  0157:H7 ISOLATED   Report Status 12/28/2011 FINAL   Final      Labs: Basic Metabolic Panel:  Lab [id number] 1150 12/28/11 0428 12/27/11 0520 12/26/11 0508 12/25/11 0525  NA 137 138 137 134* 136  K 4.0 3.3* 3.7 3.1* 3.0*  CL 108 111 109 107 106  CO2 22 21 21 19 21   GLUCOSE 189* 94 101* 97 102*  BUN 10 11 14 18 23   CREATININE 1.04 1.06 1.19* 1.32* 1.56*  CALCIUM 8.0* 7.8* 8.3* 7.7* 7.8*  MG -- -- 1.8 1.3* --  PHOS -- -- -- -- --   Liver Function Tests:  Lab 12/23/11 0244 12/23/11 0240  AST 14 14  ALT 7 8  ALKPHOS 92 92  BILITOT 0.7 0.7  PROT 5.1* 5.1*  ALBUMIN 2.6* 2.6*    Lab 12/23/11 0240  LIPASE 7*  AMYLASE --   No results found for this basename: AMMONIA:5 in the last 168 hours CBC:  Lab 12/28/11 0428 12/27/11 0520 12/26/11 0508 12/25/11 0525 12/24/11 0432 12/23/11 0244  WBC 4.8 5.6 5.5 8.1 10.5 --  NEUTROABS -- -- -- -- -- 14.2*  HGB 8.8* 9.4* 9.0* 9.4* 8.5* --  HCT 25.8* 27.9* 26.7* 28.2* 25.2* --  MCV 96.3 95.5 96.0 96.6 96.6 --  PLT 132* 165 164 143* 116* --   Cardiac Enzymes:  Lab 12/24/11 2335 12/23/11 1450 12/23/11 0825 12/23/11 0240 12/22/11 1645  CKTOTAL 11 24 17 20  --  CKMB 1.5 1.5 1.4 1.3 --  CKMBINDEX -- -- -- -- --  TROPONINI <0.30 <0.30 <0.30 <0.30 <0.30   Time coordinating discharge: Over 30 minutes  Signed:  12/28/2011, 1:06 PM  Faye Ramsay, MD  [facility] Pager [phone]  If 7PM-7AM, please contact night-coverage [email or web address] Password [email or web address]

## 2011-12-28 NOTE — Discharge Instructions (Signed)
Antibiotic-Associated Diarrhea You or your child's diarrhea is due to taking an antibiotic medicine. This is a common reaction to these drugs. It does not mean you are allergic to the medicine. The diarrhea is usually not severe. The stools will return to normal several days after completing the antibiotic treatment. If a diaper rash occurs, you can wash the irritated area carefully. Then apply a cream or an ointment to protect the skin. Normally it is best to complete the antibiotic treatment. To reduce symptoms avoid:  Apple and grape fruit juices.   Dairy products.   Beans.  Encourage plenty of clear fluids, such as water or sports drinks. Cultured dairy products such as yogurt may help restore normal intestinal bacteria. Medicines to control the diarrhea may help reduce symptoms. But these should not be used if the stool is bloody.  SEEK IMMEDIATE MEDICAL CARE IF:   Diarrhea does not improve after completing the antibiotic medicine.   You notice a fever, bloody stools, increased pain, or vomiting.  Document Released: 08/06/2004 Document Revised: 06/18/2011 Document Reviewed: 02/02/2008 [facility] Patient Information 2012 [place].

## 2011-12-28 NOTE — Progress Notes (Signed)
Physical Therapy Treatment Patient Details Name: [name] MRN: [id number] DOB: [birth year] Today's Date: 12/28/2011 Time: [id number] PT Time Calculation (min): 20 min  PT Assessment / Plan / Recommendation Comments on Treatment Session  pt states she is only concerned about walking and wants to go home today    Follow Up Recommendations  Home health PT    Barriers to Discharge        Equipment Recommendations  None recommended by PT    Recommendations for Other Services    Frequency Min 3X/week   Plan Discharge plan remains appropriate;Frequency remains appropriate    Precautions / Restrictions Precautions Precautions: Other (comment) Required Braces or Orthoses: Cervical Brace Cervical Brace: Hard collar   Pertinent Vitals/Pain See gait details for pain/issues    Mobility  Bed Mobility Bed Mobility: Not assessed Transfers Transfers: Sit to Stand;Stand to Sit Sit to Stand: 5: Supervision;From chair/3-in-1;With upper extremity assist;With armrests Stand to Sit: To chair/3-in-1;5: Supervision;With upper extremity assist;With armrests Details for Transfer Assistance: cues for hand placement Ambulation/Gait Ambulation/Gait Assistance: 5: Supervision Ambulation Distance (Feet): 8 Feet Assistive device: Rolling walker Ambulation/Gait Assistance Details: pt declined to amb further due to IV site pain (K+); RN notified and stated there was nothing she could do; pt did not want to go outside of room stating she wasn't allowed to do that; returned to chair and elevated LUE; pt reported increased comfort Gait Pattern: Within Functional Limits    Exercises     PT Diagnosis:    PT Problem List:   PT Treatment Interventions:     PT Goals Acute Rehab PT Goals Time For Goal Achievement: 01/01/12 Potential to Achieve Goals: Good Pt will go Sit to Stand: with modified independence PT Goal: Sit to Stand - Progress: Progressing toward goal  Visit Information  Last PT  Received On: 12/28/11 Assistance Needed: +1    Subjective Data  Subjective: I can't go outside the room   Cognition  Overall Cognitive Status: Appears within functional limits for tasks assessed/performed Arousal/Alertness: Awake/alert Orientation Level: Appears intact for tasks assessed Behavior During Session: Anxious    Balance     End of Session PT - End of Session Equipment Utilized During Treatment: Cervical collar Activity Tolerance: Patient limited by pain Patient left: in chair;with call bell/phone within reach    [facility] 12/28/2011, 10:54 AM

## 2011-12-29 ENCOUNTER — Telehealth: Payer: Self-pay

## 2011-12-29 DIAGNOSIS — IMO0001 Reserved for inherently not codable concepts without codable children: Secondary | ICD-10-CM | POA: Diagnosis not present

## 2011-12-29 DIAGNOSIS — A0472 Enterocolitis due to Clostridium difficile, not specified as recurrent: Secondary | ICD-10-CM | POA: Diagnosis not present

## 2011-12-29 DIAGNOSIS — IMO0002 Reserved for concepts with insufficient information to code with codable children: Secondary | ICD-10-CM | POA: Diagnosis not present

## 2011-12-29 DIAGNOSIS — I428 Other cardiomyopathies: Secondary | ICD-10-CM | POA: Diagnosis not present

## 2011-12-29 LAB — CULTURE, BLOOD (ROUTINE X 2)
Culture  Setup Time: 201306120824
Culture  Setup Time: 201306120824
Special Requests: NORMAL

## 2011-12-29 NOTE — Telephone Encounter (Signed)
I tried to reach patient again, patient stated she is unable to come in today and she prefers to be seen tomorrow. Schedule appointment with Dr.Hopper for 11am tomorrow. Patient aware if change in symptoms patient to be seen at Emergency room (urgently)

## 2011-12-29 NOTE — Telephone Encounter (Signed)
Anda Kraft from [facility] was calling to inform us that she feels patient needs to be seen for severe swelling, patient was recently d/c'ed from the hospital.  I informed Anda Kraft I will contact the patient directly(for she was not with the patient at the time of call)  to offer 4:00 pm appointment with Debbrah Alar at the [facility] location.  Anda Kraft also needs to know; 1.) If Dr.Hopper will be the attending MD to manage patient's orders Anda Kraft indicated they will contact the orthro doctor also)?  2.) If the answer to #1 is yes, order needed for skilled nursing to evaluate and monitor x 1 week  3.) If the answer to #1 is yes, order needed for Pyhsical Therapy 2 x weekly x 4 weeks  Dr.Hopper to advise on these above concerns.  I called patient to offer appointment, no answer, no answering service

## 2011-12-30 ENCOUNTER — Telehealth: Payer: Self-pay | Admitting: *Deleted

## 2011-12-30 ENCOUNTER — Encounter (HOSPITAL_COMMUNITY): Payer: Self-pay | Admitting: Emergency Medicine

## 2011-12-30 ENCOUNTER — Emergency Department (HOSPITAL_COMMUNITY): Payer: Medicare Other

## 2011-12-30 ENCOUNTER — Emergency Department (HOSPITAL_COMMUNITY)
Admission: EM | Admit: 2011-12-30 | Discharge: 2011-12-30 | Disposition: A | Discharge status: Home / Self Care | Payer: Medicare Other | Attending: Emergency Medicine | Admitting: Emergency Medicine

## 2011-12-30 ENCOUNTER — Ambulatory Visit: Payer: Medicare Other | Admitting: Internal Medicine

## 2011-12-30 DIAGNOSIS — R110 Nausea: Secondary | ICD-10-CM | POA: Diagnosis not present

## 2011-12-30 DIAGNOSIS — R079 Chest pain, unspecified: Secondary | ICD-10-CM | POA: Diagnosis not present

## 2011-12-30 DIAGNOSIS — I10 Essential (primary) hypertension: Secondary | ICD-10-CM | POA: Insufficient documentation

## 2011-12-30 DIAGNOSIS — I252 Old myocardial infarction: Secondary | ICD-10-CM | POA: Insufficient documentation

## 2011-12-30 DIAGNOSIS — R609 Edema, unspecified: Secondary | ICD-10-CM | POA: Insufficient documentation

## 2011-12-30 DIAGNOSIS — R000 Tachycardia, unspecified: Secondary | ICD-10-CM

## 2011-12-30 LAB — CBC
MCH: 31.9 pg (ref 26.0–34.0)
MCHC: 33.6 g/dL (ref 30.0–36.0)
MCV: 94.8 fL (ref 78.0–100.0)
Platelets: 133 10*3/uL — ABNORMAL LOW (ref 150–400)
RDW: 14.3 % (ref 11.5–15.5)

## 2011-12-30 LAB — URINALYSIS, ROUTINE W REFLEX MICROSCOPIC
Protein, ur: NEGATIVE mg/dL
Urobilinogen, UA: 0.2 mg/dL (ref 0.0–1.0)

## 2011-12-30 LAB — BASIC METABOLIC PANEL
Calcium: 8.7 mg/dL (ref 8.4–10.5)
GFR calc Af Amer: 66 mL/min — ABNORMAL LOW (ref >90)
GFR calc non Af Amer: 57 mL/min — ABNORMAL LOW (ref >90)
Glucose, Bld: 96 mg/dL (ref 70–99)
Sodium: 140 mEq/L (ref 135–145)

## 2011-12-30 LAB — POCT I-STAT TROPONIN I: Troponin i, poc: 0.01 ng/mL (ref 0.00–0.08)

## 2011-12-30 LAB — URINE MICROSCOPIC-ADD ON

## 2011-12-30 LAB — CARDIAC PANEL(CRET KIN+CKTOT+MB+TROPI): Total CK: 22 U/L (ref 7–177)

## 2011-12-30 LAB — DIFFERENTIAL
Basophils Absolute: 0 10*3/uL (ref 0.0–0.1)
Basophils Relative: 1 % (ref 0–1)
Eosinophils Absolute: 0.2 10*3/uL (ref 0.0–0.7)
Eosinophils Relative: 2 % (ref 0–5)

## 2011-12-30 MED ORDER — SODIUM CHLORIDE 0.9 % IV SOLN
INTRAVENOUS | Status: DC
  Administered 2011-12-30: 999 mL via INTRAVENOUS

## 2011-12-30 MED ORDER — METOPROLOL TARTRATE 1 MG/ML IV SOLN
INTRAVENOUS | Status: AC
  Filled 2011-12-30: qty 5

## 2011-12-30 MED ORDER — METOPROLOL TARTRATE 1 MG/ML IV SOLN: 5.0000 mg | Freq: Once | INTRAVENOUS | Status: DC

## 2011-12-30 MED ORDER — ONDANSETRON HCL 4 MG/2ML IJ SOLN
4.0000 mg | Freq: Once | INTRAMUSCULAR | Status: AC
  Administered 2011-12-30: 4 mg via INTRAVENOUS

## 2011-12-30 MED ORDER — ONDANSETRON HCL 4 MG/2ML IJ SOLN
INTRAMUSCULAR | Status: AC
  Filled 2011-12-30: qty 2

## 2011-12-30 MED ORDER — METOPROLOL TARTRATE 1 MG/ML IV SOLN
5.0000 mg | Freq: Once | INTRAVENOUS | Status: AC
  Administered 2011-12-30: 5 mg via INTRAVENOUS
  Filled 2011-12-30: qty 5

## 2011-12-30 MED ORDER — ONDANSETRON HCL 4 MG PO TABS: 4.0000 mg | ORAL_TABLET | Freq: Four times a day (QID) | ORAL | Status: AC

## 2011-12-30 NOTE — Discharge Instructions (Signed)
Your blood tests, do not show any damage to your heart.  Your heart rate is controlled.  Use Coreg 25 mg twice a day as prescribed by your doctor.  Use Zofran for nausea and vomiting.  Followup with your physician, for reevaluation.  Return for worse or uncontrolled symptoms

## 2011-12-30 NOTE — Telephone Encounter (Signed)
[facility] Triage Call Report Triage Record Num: [id number] Operator: Ozella Almond Patient Name: [name] Call Date & Time: 12/29/2011 9:44:59PM Patient Phone: [phone] PCP: Unice Cobble Patient Gender: Female PCP Fax : [phone] Patient DOB: 09/29/1921 Practice Name[facility] Reason for Call: Caller: Amy/Other; PCP: Unice Cobble; CB#: [phone]; Call regarding Just released for colitis and c-diff, now has bad swelling in legs, ankles, and left arm; Amy/Granddaughter calling regarding swelling to bil legs/ankles, left arm and hand, painful to walk. Denies SOB, cough. Onset of swelling since she was in hospital from 6/11-6/17. Has appt at office for f/u 12/30/11. Noticed left hand and arm swollen 12/29/11. Afebrile. Emergent s/s for Edema-Atraumatic r/o per protocol except for see in 24 hrs due to edema newly worse than usual pattern or newly developed in last 12 hrs. Pt wanted RN to call her granddaughter Amy and notify her, spoke with Amy per pts request. States PT was at home earlier today and no change in edema and PT made her the appt for 11 am 12/30/11. Advised to proceed to ED if s/s change or worsen and compliant. Protocol(s) Used: Edema, Atraumatic Recommended Outcome per Protocol: See Provider within 24 hours Reason for Outcome: Edema newly worse than usual pattern OR newly developed in last 12 hours Care Advice: ~ Call provider if symptoms worsen or new symptoms develop. ~ Rest in a reclining chair or elevate head and chest on 2 or 3 pillows when lying down to make breathing easier. Go to ED IMMEDIATELY if developing increased shortness of breath, continuous cough, worsening fatigue, or unable to perform ADLs. ~ ~ SYMPTOM / CONDITION MANAGEMENT ~ IMMEDIATE ACTION LEG CARE: - Avoid prolonged sitting or standing; take a break to move around every hour or so. - Keep legs raised when sitting, resting or sleeping; when possible raise  legs above level of the heart for 20 -30 minutes. - Do not cross your legs. - Wear loose, non-restrictive clothing, especially around waist, groin area and legs. - Consider using support hose if recommended by your provider. ~ 12/29/2011 10:11:30PM Page 1 of 1 [email or web address]

## 2011-12-30 NOTE — ED Notes (Signed)
Repeat ECG given to Dr. Vanessa Kick

## 2011-12-30 NOTE — Telephone Encounter (Signed)
Pt seen in ED 

## 2011-12-30 NOTE — ED Notes (Signed)
Pt. Stated, I started having bad nausea yesterday with chest pressure.

## 2011-12-30 NOTE — Telephone Encounter (Signed)
Appointment was canceled per scheduled Marye Round), patient was taken to the hospital

## 2011-12-30 NOTE — ED Provider Notes (Addendum)
History     CSN: [id number]  Arrival date & time 12/30/11  0945   First MD Initiated Contact with Patient 12/30/11 0957      Chief Complaint  Patient presents with  . Nausea    "chest pressure"    (Consider location/radiation/quality/duration/timing/severity/associated sxs/prior treatment) The history is provided by the patient, medical records and a relative. The history is limited by the condition of the patient.   the patient is an 78 year old, female, with a history of cervical fracture, myocardial infarction, cardiomyopathy, and tachybradycardia syndrome.  She presents to emergency department complaining of nausea since last night along with a fluttering feeling in her chest.  She denies pain in her chest.  She denies vomiting, fevers, chills, or sweating.  She has had a slight cough.  She denies urinary tract symptoms, or diarrhea.  Level V caveat applies for urgent need for intervention.  Because of tachycardia  Past Medical History  Diagnosis Date  . 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 Resolved. --dx'd on cath 2005. EF 29% with minimal distal CAD (small OM1 70-80).  Echo 10/09 EF 55%  . Hypertension     SEVERE  . Idiopathic thrombocytopenic purpura (ITP)     DR. Jamse Arn  . Granuloma annulare   . History of phlebitis   . Lower extremity edema   . Osteoporosis   . Colitis, ischemic   . Fall     with non-healing rib fractures  . Lumbar stenosis 2004    DR. MARK ROY  . AV block, 1st degree   . Edema   . Complication of anesthesia     hard to wake up  . MI (myocardial infarction)     Non ST elevation 2005 per pt  . Compression fracture of C-spine 10/10/2011    [place] at home, tx at [facility]    Past Surgical History  Procedure Date  . Cataract extraction, bilateral   . Colonoscopy w/ polypectomy   . Abdominal hysterectomy   . Rotator cuff repair   . Corneal transplant 11-27-02    left eye  . Cardiac catheterization     12/2003  . Back surgery  10/2004    LS Disc 4-5 no sx.  . Colonoscopy 09/2005    adenoma  . Rectal bleed 05-01-06    colonoscopy-ischemic colitis  . Appendectomy   . Vertebroplasty     Family History  Problem Relation Age of Onset  . Coronary artery disease Mother     History  Substance Use Topics  . Smoking status: Never Smoker   . Smokeless tobacco: Never Used  . Alcohol Use: No    OB History    Grav Para Term Preterm Abortions TAB SAB Ect Mult Living                  Review of Systems  Constitutional: Positive for chills. Negative for fever.  HENT: Negative for congestion.   Respiratory: Positive for cough. Negative for chest tightness and shortness of breath.   Cardiovascular: Positive for leg swelling. Negative for chest pain and palpitations.  Gastrointestinal: Positive for nausea. Negative for vomiting, abdominal pain and diarrhea.  Genitourinary: Negative for dysuria.  Neurological: Negative for light-headedness and headaches.  Hematological: Bruises/bleeds easily.  Psychiatric/Behavioral: Negative for confusion.  All other systems reviewed and are negative.    Allergies  Penicillins; Alendronate sodium; Colchicine; Streptomycin; and Morphine and related  Home Medications   Current Outpatient Rx  Name Route Sig Dispense Refill  .  BENAZEPRIL HCL 20 MG PO TABS Oral Take 20 mg by mouth daily.    . OCUVITE PO TABS Oral Take 1 tablet by mouth daily.      Marland Kitchen BRIMONIDINE TARTRATE 0.2 % OP SOLN Right Eye Place 1 drop into the right eye 2 (two) times daily.      Marland Kitchen CARBOXYMETHYLCELLULOSE SODIUM 0.5 % OP SOLN Both Eyes Place 1 drop into both eyes 2 (two) times daily.     Marland Kitchen CARVEDILOL 25 MG PO TABS Oral Take 12.5 mg by mouth 2 (two) times daily with a meal.     . FLUTICASONE PROPIONATE 50 MCG/ACT NA SUSP  USE 1 SPRAY IN EACH NOSTRIL TWICE A DAY 16 g 3  . METRONIDAZOLE 500 MG PO TABS Oral Take 1 tablet (500 mg total) by mouth 3 (three) times daily. 30 tablet 0  . OSTEO BI-FLEX ADV JOINT SHIELD  PO TABS Oral Take 2 tablets by mouth daily.      Marland Kitchen OMEPRAZOLE 20 MG PO CPDR Oral Take 20 mg by mouth daily.      Marland Kitchen TIMOLOL MALEATE 0.25 % OP SOLN Right Eye Place 1 drop into the right eye 2 (two) times daily.        BP 184/66  Pulse 130  Temp 97.3 F (36.3 C) (Oral)  Resp 18  SpO2 99%  Physical Exam  Nursing note and vitals reviewed. Constitutional: She is oriented to person, place, and time. She appears well-developed and well-nourished. No distress.  HENT:  Head: Normocephalic and atraumatic.  Eyes: Conjunctivae and EOM are normal.  Neck:       Wearing a cervical collar since her neck fracture several months ago  Cardiovascular:  No murmur heard.      Tachycardia  Pulmonary/Chest: Effort normal. No respiratory distress. She has no rales.  Abdominal: Soft. There is no tenderness.  Musculoskeletal: She exhibits edema and tenderness.       Bilateral pitting edema, without, discoloration or temperature change  Neurological: She is alert and oriented to person, place, and time.  Skin: Skin is warm and dry.  Psychiatric: Thought content normal.    ED Course  Procedures (including critical care time) 78 year old, female, with history of cardiomyopathy, and tachybradycardia syndrome, and myocardial infarction, presents with nausea, and tachycardia.  We will slow her heart rate, down, and perform laboratory testing, and a chest x-ray, for further evaluation   Labs Reviewed  CBC  DIFFERENTIAL  BASIC METABOLIC PANEL  URINALYSIS, ROUTINE W REFLEX MICROSCOPIC  CARDIAC PANEL(CRET KIN+CKTOT+MB+TROPI)   No results found.   No diagnosis found.  2:19 PM sxs resolved Hr 60s Pt is supposed to take coreg 25 mg bid. She admits she takes only 1/2 tab (12.5 mg) bid  ECG. Normal sinus rhythm at 78 beats per minute with occasional PVC. Normal axis. Normal intervals. Normal.  ST and T-wave Impression sinus rhythm with occasional PVC  MDM  Nausea, with tachycardia- resolved in  ed No acs.        Barbara Cower, MD 12/30/11 [place], MD 12/30/11 [place], MD 02/07/12 1108

## 2011-12-31 ENCOUNTER — Encounter: Payer: Self-pay | Admitting: Internal Medicine

## 2011-12-31 ENCOUNTER — Ambulatory Visit (INDEPENDENT_AMBULATORY_CARE_PROVIDER_SITE_OTHER): Payer: Medicare Other | Admitting: Internal Medicine

## 2011-12-31 VITALS — BP 126/78 | HR 72 | Temp 98.3°F | Wt 184.0 lb

## 2011-12-31 DIAGNOSIS — A0472 Enterocolitis due to Clostridium difficile, not specified as recurrent: Secondary | ICD-10-CM | POA: Diagnosis not present

## 2011-12-31 DIAGNOSIS — R0609 Other forms of dyspnea: Secondary | ICD-10-CM | POA: Diagnosis not present

## 2011-12-31 DIAGNOSIS — I428 Other cardiomyopathies: Secondary | ICD-10-CM | POA: Diagnosis not present

## 2011-12-31 DIAGNOSIS — Z8679 Personal history of other diseases of the circulatory system: Secondary | ICD-10-CM | POA: Diagnosis not present

## 2011-12-31 DIAGNOSIS — R609 Edema, unspecified: Secondary | ICD-10-CM

## 2011-12-31 DIAGNOSIS — R0989 Other specified symptoms and signs involving the circulatory and respiratory systems: Secondary | ICD-10-CM

## 2011-12-31 DIAGNOSIS — R069 Unspecified abnormalities of breathing: Secondary | ICD-10-CM

## 2011-12-31 DIAGNOSIS — I509 Heart failure, unspecified: Secondary | ICD-10-CM | POA: Diagnosis not present

## 2011-12-31 DIAGNOSIS — N259 Disorder resulting from impaired renal tubular function, unspecified: Secondary | ICD-10-CM

## 2011-12-31 LAB — BRAIN NATRIURETIC PEPTIDE: Pro B Natriuretic peptide (BNP): 239 pg/mL — ABNORMAL HIGH (ref 0.0–100.0)

## 2011-12-31 MED ORDER — AMBULATORY NON FORMULARY MEDICATION: Status: DC

## 2011-12-31 MED ORDER — FUROSEMIDE 40 MG PO TABS: 40.0000 mg | ORAL_TABLET | Freq: Every day | ORAL | Status: DC

## 2011-12-31 NOTE — Progress Notes (Signed)
  Subjective:    Patient ID: [name], female    DOB: 11-12-1922, 78 y.o.   MRN: [id number]  [facility] records were reviewed. The most recent ER visit on 6/19 for persistent nausea and "fluttering" in her chest. Blood pressure was 184/66, pulse 130, respiratory rate 18 and O2 sats 99%. Her carvedilol was to be increased to 25 mg twice a day; but she had concerns about this .  Labs performed 6/19 were reviewed. Creatinine was 0.89; GFR 57; hematocrit 33, platelet count 133,000. Cardiac enzymes were negative.  She been hospitalized 6/11-17 with pseudomembranous colitis related to C. difficile infection. Her Discharge labs 12/28/11 revealed a potassium of 3.3, GFR 46, and hematocrit 25.8. She was diagnosed as having colitis with C. difficile    Review of Systems She stopped her metronidazole day before yesterday because of nausea. This had been prescribed for 10 days. She is not had nausea since 6/19. She denies vomiting or diarrhea at this time but stool is loose. She denies fever, chills, or sweats. She also is not having chest pain, palpitations, dyspnea, or paroxysmal nocturnal dyspnea. Because of the significant edema she is not able to wear shoes. She states her furosemide is outdated and has not been taken since 6/8.     Objective:   Physical Exam   She appears adequately nourished and in no acute distress  Skin is pale but dry.  She has low-grade rales at the bases. There is no increased work of breathing at this time  A slow S4 is suggested. There is no S3 gallop.  Abdomen is soft; bowel sounds are slightly decreased. There is no tenderness to palpation.  She has 1+ edema to mid shin.  Pedal pulses are decreased by the edema.  She is alert and oriented          Assessment & Plan:   #1 C. difficile colitis  #2 nausea with metronidazole  #3 brady- tachycardia syndrome; pulse presently controlled  #4 clinical congestive heart failure with rales and  edema.  Plan: Probiotic will be prescribed. She'll be asked to try to take the metronidazole  1/2 pill  3  times a day.  Clinical indications for carvedilol 25 mg twice a day will be discussed. Sodium restriction encouraged. BNP will be checked as a baseline.

## 2011-12-31 NOTE — Patient Instructions (Addendum)
Avoid ingestion of  excess salt/sodium.Cook with pepper & other spices . Use the salt substitute "No Salt" OR the Mrs Deliah Boston products to season food @ the table. Avoid foods which taste salty or "vinegary" as their sodium contentet will be high.  Please try to go on My Chart within the next 24 hours to allow me to release the results directly to you.     [id number]: [place]  For 6/18-8/16/13  reviewed & completed. Discrepancies noted between EMR Med List & meds listed on form . A copy of the 7/9 office visit with Mr. Kathlen Mody was sent with the certification form. Medication list as of that date was also included. Handwritten notes made on form  & request made for clarification as follows: Please document the name of the prescribing caregiver beside each medication listed on the patient's Plan of Care medication list.  This is to enhance continuity of care and prevent potential  adverse drug:drug interactions due to duplication of medications as branded and generic forms; repeated medication changes with hospitalizations and/or  at outpatient subspecialty visits; and lack of the EMR in some medical practices. Such risk is greatest with polypharmacy especially among  geriatric patients as has been documented repeatedly by Dr. Elliot Cousin and his associates in multiple long-term studies.  02/09/12: Form resubmitted with a copy of the 02/03/12 office visit with Richardson Dopp, [place] with its updated medical list [id number]: Same certification form [id number] was seen for the third time. I signed it and noted that this had been completed 01/24/12 with addendum on 7/30 as noted above.

## 2011-12-31 NOTE — Assessment & Plan Note (Signed)
Renal function has improved as of 6/19; GFR was 57 only minimally reduced

## 2011-12-31 NOTE — Addendum Note (Signed)
Addended by: Logan Bores on: 12/31/2011 03:05 PM   Modules accepted: Orders

## 2012-01-01 DIAGNOSIS — I428 Other cardiomyopathies: Secondary | ICD-10-CM | POA: Diagnosis not present

## 2012-01-01 DIAGNOSIS — IMO0002 Reserved for concepts with insufficient information to code with codable children: Secondary | ICD-10-CM | POA: Diagnosis not present

## 2012-01-01 DIAGNOSIS — A0472 Enterocolitis due to Clostridium difficile, not specified as recurrent: Secondary | ICD-10-CM | POA: Diagnosis not present

## 2012-01-01 DIAGNOSIS — IMO0001 Reserved for inherently not codable concepts without codable children: Secondary | ICD-10-CM | POA: Diagnosis not present

## 2012-01-04 ENCOUNTER — Telehealth: Payer: Self-pay | Admitting: Internal Medicine

## 2012-01-04 DIAGNOSIS — I428 Other cardiomyopathies: Secondary | ICD-10-CM | POA: Diagnosis not present

## 2012-01-04 DIAGNOSIS — IMO0002 Reserved for concepts with insufficient information to code with codable children: Secondary | ICD-10-CM | POA: Diagnosis not present

## 2012-01-04 DIAGNOSIS — A0472 Enterocolitis due to Clostridium difficile, not specified as recurrent: Secondary | ICD-10-CM | POA: Diagnosis not present

## 2012-01-04 DIAGNOSIS — IMO0001 Reserved for inherently not codable concepts without codable children: Secondary | ICD-10-CM | POA: Diagnosis not present

## 2012-01-04 NOTE — Telephone Encounter (Signed)
Home Health called and is hoping to get a verbal order to see the patient.  Callback - [phone] Manuela Schwartz).  She stated it was fine to leave a voice mail if you she did not pick up. Thanks!

## 2012-01-04 NOTE — Telephone Encounter (Signed)
I spoke with [name] and she was just verifying the order sent over on the 20 th

## 2012-01-04 NOTE — Telephone Encounter (Signed)
Left message on voicemail for [name] informing her to clarify what type of verbal order she is requesting, we faxed over an order for skilled nursing and PT on 12/31/11.

## 2012-01-05 DIAGNOSIS — A0472 Enterocolitis due to Clostridium difficile, not specified as recurrent: Secondary | ICD-10-CM | POA: Diagnosis not present

## 2012-01-05 DIAGNOSIS — I428 Other cardiomyopathies: Secondary | ICD-10-CM | POA: Diagnosis not present

## 2012-01-05 DIAGNOSIS — IMO0001 Reserved for inherently not codable concepts without codable children: Secondary | ICD-10-CM | POA: Diagnosis not present

## 2012-01-05 DIAGNOSIS — IMO0002 Reserved for concepts with insufficient information to code with codable children: Secondary | ICD-10-CM | POA: Diagnosis not present

## 2012-01-06 DIAGNOSIS — IMO0001 Reserved for inherently not codable concepts without codable children: Secondary | ICD-10-CM | POA: Diagnosis not present

## 2012-01-06 DIAGNOSIS — IMO0002 Reserved for concepts with insufficient information to code with codable children: Secondary | ICD-10-CM | POA: Diagnosis not present

## 2012-01-06 DIAGNOSIS — A0472 Enterocolitis due to Clostridium difficile, not specified as recurrent: Secondary | ICD-10-CM | POA: Diagnosis not present

## 2012-01-06 DIAGNOSIS — I428 Other cardiomyopathies: Secondary | ICD-10-CM | POA: Diagnosis not present

## 2012-01-07 DIAGNOSIS — IMO0002 Reserved for concepts with insufficient information to code with codable children: Secondary | ICD-10-CM | POA: Diagnosis not present

## 2012-01-07 DIAGNOSIS — A0472 Enterocolitis due to Clostridium difficile, not specified as recurrent: Secondary | ICD-10-CM | POA: Diagnosis not present

## 2012-01-07 DIAGNOSIS — I428 Other cardiomyopathies: Secondary | ICD-10-CM | POA: Diagnosis not present

## 2012-01-07 DIAGNOSIS — IMO0001 Reserved for inherently not codable concepts without codable children: Secondary | ICD-10-CM | POA: Diagnosis not present

## 2012-01-08 DIAGNOSIS — IMO0001 Reserved for inherently not codable concepts without codable children: Secondary | ICD-10-CM | POA: Diagnosis not present

## 2012-01-08 DIAGNOSIS — A0472 Enterocolitis due to Clostridium difficile, not specified as recurrent: Secondary | ICD-10-CM | POA: Diagnosis not present

## 2012-01-08 DIAGNOSIS — IMO0002 Reserved for concepts with insufficient information to code with codable children: Secondary | ICD-10-CM | POA: Diagnosis not present

## 2012-01-08 DIAGNOSIS — I428 Other cardiomyopathies: Secondary | ICD-10-CM | POA: Diagnosis not present

## 2012-01-11 DIAGNOSIS — A0472 Enterocolitis due to Clostridium difficile, not specified as recurrent: Secondary | ICD-10-CM | POA: Diagnosis not present

## 2012-01-11 DIAGNOSIS — IMO0001 Reserved for inherently not codable concepts without codable children: Secondary | ICD-10-CM | POA: Diagnosis not present

## 2012-01-11 DIAGNOSIS — I428 Other cardiomyopathies: Secondary | ICD-10-CM | POA: Diagnosis not present

## 2012-01-11 DIAGNOSIS — IMO0002 Reserved for concepts with insufficient information to code with codable children: Secondary | ICD-10-CM | POA: Diagnosis not present

## 2012-01-12 DIAGNOSIS — IMO0002 Reserved for concepts with insufficient information to code with codable children: Secondary | ICD-10-CM | POA: Diagnosis not present

## 2012-01-12 DIAGNOSIS — I428 Other cardiomyopathies: Secondary | ICD-10-CM | POA: Diagnosis not present

## 2012-01-12 DIAGNOSIS — A0472 Enterocolitis due to Clostridium difficile, not specified as recurrent: Secondary | ICD-10-CM | POA: Diagnosis not present

## 2012-01-12 DIAGNOSIS — IMO0001 Reserved for inherently not codable concepts without codable children: Secondary | ICD-10-CM | POA: Diagnosis not present

## 2012-01-13 DIAGNOSIS — IMO0001 Reserved for inherently not codable concepts without codable children: Secondary | ICD-10-CM | POA: Diagnosis not present

## 2012-01-13 DIAGNOSIS — S12100A Unspecified displaced fracture of second cervical vertebra, initial encounter for closed fracture: Secondary | ICD-10-CM | POA: Diagnosis not present

## 2012-01-13 DIAGNOSIS — S12000A Unspecified displaced fracture of first cervical vertebra, initial encounter for closed fracture: Secondary | ICD-10-CM | POA: Diagnosis not present

## 2012-01-13 DIAGNOSIS — S12600A Unspecified displaced fracture of seventh cervical vertebra, initial encounter for closed fracture: Secondary | ICD-10-CM | POA: Diagnosis not present

## 2012-01-13 DIAGNOSIS — I428 Other cardiomyopathies: Secondary | ICD-10-CM | POA: Diagnosis not present

## 2012-01-13 DIAGNOSIS — IMO0002 Reserved for concepts with insufficient information to code with codable children: Secondary | ICD-10-CM | POA: Diagnosis not present

## 2012-01-13 DIAGNOSIS — A0472 Enterocolitis due to Clostridium difficile, not specified as recurrent: Secondary | ICD-10-CM | POA: Diagnosis not present

## 2012-01-14 DIAGNOSIS — A0472 Enterocolitis due to Clostridium difficile, not specified as recurrent: Secondary | ICD-10-CM | POA: Diagnosis not present

## 2012-01-14 DIAGNOSIS — IMO0002 Reserved for concepts with insufficient information to code with codable children: Secondary | ICD-10-CM | POA: Diagnosis not present

## 2012-01-14 DIAGNOSIS — IMO0001 Reserved for inherently not codable concepts without codable children: Secondary | ICD-10-CM | POA: Diagnosis not present

## 2012-01-14 DIAGNOSIS — I428 Other cardiomyopathies: Secondary | ICD-10-CM | POA: Diagnosis not present

## 2012-01-15 ENCOUNTER — Telehealth: Payer: Self-pay | Admitting: Internal Medicine

## 2012-01-15 DIAGNOSIS — IMO0001 Reserved for inherently not codable concepts without codable children: Secondary | ICD-10-CM | POA: Diagnosis not present

## 2012-01-15 DIAGNOSIS — IMO0002 Reserved for concepts with insufficient information to code with codable children: Secondary | ICD-10-CM | POA: Diagnosis not present

## 2012-01-15 DIAGNOSIS — I428 Other cardiomyopathies: Secondary | ICD-10-CM | POA: Diagnosis not present

## 2012-01-15 DIAGNOSIS — A0472 Enterocolitis due to Clostridium difficile, not specified as recurrent: Secondary | ICD-10-CM | POA: Diagnosis not present

## 2012-01-15 NOTE — Telephone Encounter (Signed)
would like orders from hopper. current orders ran out that were wrt. by Jani Gravel Requesting from current til 8.16.13 for congestive heart failure Call back # [phone] pam/[facility]

## 2012-01-15 NOTE — Telephone Encounter (Signed)
Would like order to continue seeing patient for COPD and edema to lower extremities. (Medication management). Orders originally came from [place], pending appointment with Cardiology [facility]- this coming Tuesday) due to ongoing edema.   Dr.Hopper please advise

## 2012-01-15 NOTE — Telephone Encounter (Signed)
Please call when you can regarding letter (6.25.13) recv. From labs dated 6.20.13 Call back # [phone]

## 2012-01-15 NOTE — Telephone Encounter (Signed)
OK 

## 2012-01-15 NOTE — Telephone Encounter (Signed)
Unable to reach pam or leave a message

## 2012-01-15 NOTE — Telephone Encounter (Signed)
New problem:  Per pam from [facility] at patient home now . C/o 3 + edema  Leg swollen.  pt broke neck about 3 months ago, unable to come to appt on Monday.  Has appt on Tuesday with Richardson Dopp @ 2 pm

## 2012-01-15 NOTE — Telephone Encounter (Signed)
Spoke with Jeannene Patella, gave the ok per Dr.Hopper

## 2012-01-18 ENCOUNTER — Ambulatory Visit: Payer: Medicare Other | Admitting: Nurse Practitioner

## 2012-01-18 DIAGNOSIS — A0472 Enterocolitis due to Clostridium difficile, not specified as recurrent: Secondary | ICD-10-CM | POA: Diagnosis not present

## 2012-01-18 DIAGNOSIS — IMO0001 Reserved for inherently not codable concepts without codable children: Secondary | ICD-10-CM | POA: Diagnosis not present

## 2012-01-18 DIAGNOSIS — I428 Other cardiomyopathies: Secondary | ICD-10-CM | POA: Diagnosis not present

## 2012-01-18 DIAGNOSIS — IMO0002 Reserved for concepts with insufficient information to code with codable children: Secondary | ICD-10-CM | POA: Diagnosis not present

## 2012-01-19 ENCOUNTER — Ambulatory Visit (HOSPITAL_BASED_OUTPATIENT_CLINIC_OR_DEPARTMENT_OTHER): Payer: Medicare Other

## 2012-01-19 ENCOUNTER — Ambulatory Visit (INDEPENDENT_AMBULATORY_CARE_PROVIDER_SITE_OTHER): Payer: Medicare Other | Admitting: Physician Assistant

## 2012-01-19 ENCOUNTER — Encounter: Payer: Self-pay | Admitting: Physician Assistant

## 2012-01-19 VITALS — BP 126/76 | Ht 65.0 in | Wt 179.0 lb

## 2012-01-19 VITALS — BP 135/74 | HR 65 | Temp 97.4°F

## 2012-01-19 DIAGNOSIS — I5181 Takotsubo syndrome: Secondary | ICD-10-CM

## 2012-01-19 DIAGNOSIS — D510 Vitamin B12 deficiency anemia due to intrinsic factor deficiency: Secondary | ICD-10-CM | POA: Diagnosis not present

## 2012-01-19 DIAGNOSIS — R609 Edema, unspecified: Secondary | ICD-10-CM

## 2012-01-19 DIAGNOSIS — I428 Other cardiomyopathies: Secondary | ICD-10-CM | POA: Diagnosis not present

## 2012-01-19 DIAGNOSIS — I10 Essential (primary) hypertension: Secondary | ICD-10-CM | POA: Diagnosis not present

## 2012-01-19 LAB — HEPATIC FUNCTION PANEL
Alkaline Phosphatase: 70 U/L (ref 39–117)
Bilirubin, Direct: 0.1 mg/dL (ref 0.0–0.3)

## 2012-01-19 LAB — CBC WITH DIFFERENTIAL/PLATELET
Basophils Absolute: 0 10*3/uL (ref 0.0–0.1)
Eosinophils Absolute: 0.3 10*3/uL (ref 0.0–0.7)
Lymphocytes Relative: 28.8 % (ref 12.0–46.0)
MCHC: 33.7 g/dL (ref 30.0–36.0)
Neutrophils Relative %: 55.4 % (ref 43.0–77.0)
RDW: 14.9 % — ABNORMAL HIGH (ref 11.5–14.6)

## 2012-01-19 LAB — BASIC METABOLIC PANEL
CO2: 27 mEq/L (ref 19–32)
Calcium: 8.7 mg/dL (ref 8.4–10.5)
Creatinine, Ser: 1.3 mg/dL — ABNORMAL HIGH (ref 0.4–1.2)
Glucose, Bld: 98 mg/dL (ref 70–99)

## 2012-01-19 MED ORDER — CYANOCOBALAMIN 1000 MCG/ML IJ SOLN
1000.0000 ug | Freq: Once | INTRAMUSCULAR | Status: AC
  Administered 2012-01-19: 1000 ug via INTRAMUSCULAR

## 2012-01-19 MED ORDER — BENAZEPRIL HCL 20 MG PO TABS: 20.0000 mg | ORAL_TABLET | Freq: Every day | ORAL | Status: DC

## 2012-01-19 MED ORDER — FUROSEMIDE 40 MG PO TABS: ORAL_TABLET | ORAL | Status: DC

## 2012-01-19 MED ORDER — POTASSIUM CHLORIDE CRYS ER 20 MEQ PO TBCR: EXTENDED_RELEASE_TABLET | ORAL | Status: DC

## 2012-01-19 NOTE — Patient Instructions (Addendum)
Your physician has recommended you make the following change in your medication: INCREASE LASIX TO 60 MG TWICE DAILY FOR 2 DAYS; THEN START 40 MG TWICE DAILY  INCREASE POTASSIUM 20 MEQ TWICE DAILY FOR 2 DAYS; THEN START POTASSIUM 20 [place]   Your physician recommends that you return for lab work in: [place], BMET, CBC W/DIFF, LFT  REPEAT BMET 1 WEEK 01/26/12  Your physician has requested that you have an echocardiogram. Echocardiography is a painless test that uses sound waves to create images of your heart. It provides your doctor with information about the size and shape of your heart and how well your heart's chambers and valves are working. This procedure takes approximately one hour. There are no restrictions for this procedure.   Your physician recommends that you schedule a follow-up appointment in: 02/03/12 @ 11:10 WITH SCOTT WEAVER, [facility],   Your physician recommends that you schedule a follow-up appointment in: [place] DX CARDIOMYOPATHY

## 2012-01-19 NOTE — Progress Notes (Signed)
[place],   [address] Phone: [phone] Fax:  [phone]  Date:  01/19/2012   Name:  [name]   DOB:  [birth year]   MRN:  [id number]  PCP:  Unice Cobble, MD  Primary Cardiologist:  Dr. Glori Bickers  Primary Electrophysiologist:  None    History of Present Illness: MYONA OBANDO is a 78 y.o. female who returns for evaluation of edema.  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  LHC in 6/05 in setting of NSTEMI: ant apical AK and inf-apical AK, EF 29%, dOM2 70-80% (small); findings c/w Tako-Tsubo CM.  EF has recovered.  Last echo 10/09: EF 55%, mild LVH, mild AI, mild MAC, mild MR, mild LAE, mild PR, mild TR, mild to mod increased peak PASP.  Also has a h/o HTN, SVT and LE edema.  Last seen by Dr. Glori Bickers in 09/2010.  Edema has been managed in past with Lasix.  Admitted to [facility] in 09/2011 with a cervical spine fracture.  She did not have surgery.  She remained there for 4 weeks and then went to outpatient rehabilitation for 4 weeks.  Admitted to [facility] in 12/2011 with CDiff colitis.    She started noticing worsening lower extremity edema and she was discharged from [facility].  Her lower extremity edema is the worst it has been.  She has not noticed any worsening shortness of breath with exertion.  She probably describes class IIb symptoms.  She denies orthopnea or PND.  She denies chest pain.  She denies syncope or near-syncope.  She denies any recent palpitations.  Of note, she did go to emergency room about a month ago with tachycardia and her usual dose of beta blocker was restarted.  Wt Readings from Last 3 Encounters:  01/19/12 179 lb (81.194 kg)  12/31/11 184 lb (83.462 kg)  12/28/11 186 lb 4.6 oz (84.5 kg)     Potassium  Date/Time Value Range Status  12/30/2011 10:36 AM 3.6  3.5 - 5.1 mEq/L Final     Creatinine, Ser  Date/Time Value Range  Status  12/30/2011 10:36 AM 0.89  0.50 - 1.10 mg/dL Final     ALT  Date/Time Value Range Status  12/23/2011  2:44 AM 7  0 - 35 U/L Final     TSH  Date/Time Value Range Status  12/23/2011  2:44 AM 2.246  0.350 - 4.500 uIU/mL Final   Hemoglobin  Date/Time Value Range Status  12/30/2011 10:36 AM 11.1* 12.0 - 15.0 g/dL Final   Pro B Natriuretic peptide (BNP)  Date/Time Value Range Status  12/31/2011  1:59 PM 239.0* 0.0 - 100.0 pg/mL Final    Past Medical History  Diagnosis Date  . 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 Resolved. --dx'd on cath 2005. EF 29% with minimal distal CAD (small OM1 70-80).  Echo 10/09 EF 55%  . Hypertension     SEVERE  . Idiopathic thrombocytopenic purpura (ITP)     DR. Jamse Arn  . Granuloma annulare   . History of phlebitis   . Lower extremity edema   . Osteoporosis   . Colitis, ischemic   . Fall     with non-healing rib fractures  . Lumbar stenosis 2004    DR. MARK ROY  . AV block, 1st degree   . Edema   . Complication of anesthesia     hard to wake up  .  MI (myocardial infarction)     Non ST elevation 2005 per pt  . Compression fracture of C-spine 10/10/2011    [place] [facility] at home, tx at [facility]  . Diarrhea associated with pseudomembranous colitis 6/11-17/2013    [facility]    Current Outpatient Prescriptions  Medication Sig Dispense Refill  . [place] Referral: Arville Go   Dr.William F. Linna Darner will manage patient care/orders  1.) Skilled Nursing to evaluate and monitor 2.) Physical Therapy, 2 x weekly for 4 weeks Home Health Referral: [facility]  1 each  0  . benazepril (LOTENSIN) 20 MG tablet Take 20 mg by mouth daily.      . beta carotene w/minerals (OCUVITE) tablet Take 1 tablet by mouth daily.        . brimonidine (ALPHAGAN) 0.2 % ophthalmic solution Place 1 drop into the right eye 2 (two) times daily.        . carboxymethylcellulose (REFRESH TEARS) 0.5 % SOLN Place 1 drop into both  eyes 2 (two) times daily.       . carvedilol (COREG) 25 MG tablet Take 25 mg by mouth 2 (two) times daily with a meal.       . fluticasone (FLONASE) 50 MCG/ACT nasal spray USE 1 SPRAY IN EACH NOSTRIL TWICE A DAY  16 g  3  . furosemide (LASIX) 40 MG tablet Take 1 tablet (40 mg total) by mouth daily.  90 tablet  1  . Misc Natural Products (OSTEO BI-FLEX ADV JOINT SHIELD) TABS Take 2 tablets by mouth daily.        Marland Kitchen omeprazole (PRILOSEC) 20 MG capsule Take 20 mg by mouth daily.        . timolol (TIMOPTIC) 0.25 % ophthalmic solution Place 1 drop into the right eye 2 (two) times daily.         No current facility-administered medications for this visit.   Facility-Administered Medications Ordered in Other Visits  Medication Dose Route Frequency Provider Last Rate Last Dose  . cyanocobalamin ((VITAMIN B-12)) injection 1,000 mcg  1,000 mcg Intramuscular Once Nira Retort, MD   1,000 mcg at 01/19/12 1316    Allergies: Allergies  Allergen Reactions  . Penicillins     REACTION: joint edema  . Alendronate Sodium   . Colchicine     REACTION: hair loss  . Streptomycin   . Morphine And Related     nausea    History  Substance Use Topics  . Smoking status: Never Smoker   . Smokeless tobacco: Never Used  . Alcohol Use: No     ROS:  Please see the history of present illness.   All other systems reviewed and negative.   PHYSICAL EXAM: VS:  BP 126/76  Ht 5\' 5"  (1.651 m)  Wt 179 lb (81.194 kg)  BMI 29.79 kg/m2 Well nourished, well developed, in no acute distress HEENT: normal Neck: + JVD Cardiac:  normal S1, S2; RRR; no murmur Lungs:  Coarse Breath sounds at the bases bilaterally, no wheezing, rhonchi  Abd: soft, nontender, no hepatomegaly Ext: Tight 2+ bilateral LE edema to the knees Skin: warm and dry Neuro:  CNs 2-12 intact, no focal abnormalities noted  EKG:  Sinus bradycardia, heart rate 58, left axis deviation, nonspecific ST-T wave changes, no change from  prior   ASSESSMENT AND PLAN:  1.  Edema She has clinical signs of volume overload.  She is not particularly more short of breath though.  She did have a mildly elevated  BNP recently. She had a recent admission with colitis and has been deconditioned from her cervical spine fracture. I will increase her Lasix to 60 mg twice a day for 2 days.  She will then take 40 mg twice a day. Add potassium 20 mEq twice a day for 2 days then continue 20 mEq once daily. Check a basic metabolic panel, CBC, LFTs and urinalysis today. Repeat a basic metabolic panel in one week. Obtain a followup echocardiogram to reassess her LV function.  I suspect she has diastolic heart failure. She has been asked to keep her legs elevated as much as possible. She will return for followup with me in 2 weeks. I will arrange for her to followup with Dr. Haroldine Laws in the CHF clinic in approximately 6 weeks.  2.  Tako-Tsubo Cardiomyopathy EF had recovered in the past.  Proceed with relook echo as noted above.  3.  Hypertension Controlled.   Signed, Richardson Dopp, PA-C  2:02 PM 01/19/2012

## 2012-01-20 ENCOUNTER — Telehealth: Payer: Self-pay | Admitting: *Deleted

## 2012-01-20 DIAGNOSIS — IMO0001 Reserved for inherently not codable concepts without codable children: Secondary | ICD-10-CM | POA: Diagnosis not present

## 2012-01-20 DIAGNOSIS — A0472 Enterocolitis due to Clostridium difficile, not specified as recurrent: Secondary | ICD-10-CM | POA: Diagnosis not present

## 2012-01-20 DIAGNOSIS — I428 Other cardiomyopathies: Secondary | ICD-10-CM | POA: Diagnosis not present

## 2012-01-20 DIAGNOSIS — I10 Essential (primary) hypertension: Secondary | ICD-10-CM

## 2012-01-20 DIAGNOSIS — IMO0002 Reserved for concepts with insufficient information to code with codable children: Secondary | ICD-10-CM | POA: Diagnosis not present

## 2012-01-20 LAB — URINALYSIS
Bilirubin Urine: NEGATIVE
Ketones, ur: NEGATIVE
Leukocytes, UA: NEGATIVE
Urine Glucose: NEGATIVE
pH: 6 (ref 5.0–8.0)

## 2012-01-20 NOTE — Telephone Encounter (Signed)
pt notified of U/A results

## 2012-01-20 NOTE — Telephone Encounter (Signed)
pt notified of lab results and to stay on K+ 20 BID, will have repeat bmet 7/16 when she comes in for echo ssame day, states already drinking ensure and she will increase to 2-3 times daily

## 2012-01-20 NOTE — Telephone Encounter (Signed)
Message copied by Michae Kava on Wed Jan 20, 2012 10:05 AM ------      Message from: [place], [place] T      Created: Wed Jan 20, 2012  8:27 AM       Continue K+20 mEq bid (instead of decreasing to once daily after 2 days).        -  Have repeat BMET done Monday 01/25/12.      Hgb fairly stable.      Plt count has dropped slightly.      She has ITP and is followed by Dr. Jamse Arn (heme).        -  Keep appt with Dr. Jamse Arn this month.        -  Please fax copy of labs to Dr. Jamse Arn.      Protein levels somewhat low.        -  Try to drink Ensure/Boost 2-3 times per day.      U/A pending.      Richardson Dopp, PA-C  8:27 AM 01/20/2012

## 2012-01-20 NOTE — Telephone Encounter (Signed)
Message copied by Michae Kava on Wed Jan 20, 2012  2:39 PM ------      Message from: [place], [place] T      Created: Wed Jan 20, 2012  1:28 PM       Please notify patient that the lab results are ok.      Richardson Dopp, PA-C  1:28 PM 01/20/2012

## 2012-01-21 DIAGNOSIS — A0472 Enterocolitis due to Clostridium difficile, not specified as recurrent: Secondary | ICD-10-CM | POA: Diagnosis not present

## 2012-01-21 DIAGNOSIS — I428 Other cardiomyopathies: Secondary | ICD-10-CM | POA: Diagnosis not present

## 2012-01-21 DIAGNOSIS — IMO0002 Reserved for concepts with insufficient information to code with codable children: Secondary | ICD-10-CM | POA: Diagnosis not present

## 2012-01-21 DIAGNOSIS — IMO0001 Reserved for inherently not codable concepts without codable children: Secondary | ICD-10-CM | POA: Diagnosis not present

## 2012-01-22 DIAGNOSIS — IMO0001 Reserved for inherently not codable concepts without codable children: Secondary | ICD-10-CM | POA: Diagnosis not present

## 2012-01-22 DIAGNOSIS — IMO0002 Reserved for concepts with insufficient information to code with codable children: Secondary | ICD-10-CM | POA: Diagnosis not present

## 2012-01-22 DIAGNOSIS — I428 Other cardiomyopathies: Secondary | ICD-10-CM | POA: Diagnosis not present

## 2012-01-22 DIAGNOSIS — A0472 Enterocolitis due to Clostridium difficile, not specified as recurrent: Secondary | ICD-10-CM | POA: Diagnosis not present

## 2012-01-24 DIAGNOSIS — I428 Other cardiomyopathies: Secondary | ICD-10-CM

## 2012-01-24 DIAGNOSIS — IMO0002 Reserved for concepts with insufficient information to code with codable children: Secondary | ICD-10-CM

## 2012-01-24 DIAGNOSIS — IMO0001 Reserved for inherently not codable concepts without codable children: Secondary | ICD-10-CM | POA: Diagnosis not present

## 2012-01-24 DIAGNOSIS — A0472 Enterocolitis due to Clostridium difficile, not specified as recurrent: Secondary | ICD-10-CM | POA: Diagnosis not present

## 2012-01-25 DIAGNOSIS — I428 Other cardiomyopathies: Secondary | ICD-10-CM | POA: Diagnosis not present

## 2012-01-25 DIAGNOSIS — IMO0001 Reserved for inherently not codable concepts without codable children: Secondary | ICD-10-CM | POA: Diagnosis not present

## 2012-01-25 DIAGNOSIS — A0472 Enterocolitis due to Clostridium difficile, not specified as recurrent: Secondary | ICD-10-CM | POA: Diagnosis not present

## 2012-01-25 DIAGNOSIS — IMO0002 Reserved for concepts with insufficient information to code with codable children: Secondary | ICD-10-CM | POA: Diagnosis not present

## 2012-01-26 ENCOUNTER — Telehealth: Payer: Self-pay | Admitting: *Deleted

## 2012-01-26 ENCOUNTER — Other Ambulatory Visit: Payer: Self-pay

## 2012-01-26 ENCOUNTER — Ambulatory Visit (HOSPITAL_COMMUNITY): Payer: Medicare Other | Attending: Cardiovascular Disease | Admitting: Radiology

## 2012-01-26 ENCOUNTER — Other Ambulatory Visit (INDEPENDENT_AMBULATORY_CARE_PROVIDER_SITE_OTHER): Payer: Medicare Other

## 2012-01-26 DIAGNOSIS — I517 Cardiomegaly: Secondary | ICD-10-CM | POA: Diagnosis not present

## 2012-01-26 DIAGNOSIS — I252 Old myocardial infarction: Secondary | ICD-10-CM | POA: Diagnosis not present

## 2012-01-26 DIAGNOSIS — IMO0002 Reserved for concepts with insufficient information to code with codable children: Secondary | ICD-10-CM | POA: Diagnosis not present

## 2012-01-26 DIAGNOSIS — I428 Other cardiomyopathies: Secondary | ICD-10-CM

## 2012-01-26 DIAGNOSIS — A0472 Enterocolitis due to Clostridium difficile, not specified as recurrent: Secondary | ICD-10-CM | POA: Diagnosis not present

## 2012-01-26 DIAGNOSIS — I10 Essential (primary) hypertension: Secondary | ICD-10-CM | POA: Diagnosis not present

## 2012-01-26 DIAGNOSIS — R609 Edema, unspecified: Secondary | ICD-10-CM | POA: Insufficient documentation

## 2012-01-26 DIAGNOSIS — IMO0001 Reserved for inherently not codable concepts without codable children: Secondary | ICD-10-CM | POA: Diagnosis not present

## 2012-01-26 DIAGNOSIS — I5181 Takotsubo syndrome: Secondary | ICD-10-CM | POA: Insufficient documentation

## 2012-01-26 DIAGNOSIS — E785 Hyperlipidemia, unspecified: Secondary | ICD-10-CM | POA: Insufficient documentation

## 2012-01-26 LAB — BASIC METABOLIC PANEL
BUN: 33 mg/dL — ABNORMAL HIGH (ref 6–23)
Chloride: 102 mEq/L (ref 96–112)
Creatinine, Ser: 1.6 mg/dL — ABNORMAL HIGH (ref 0.4–1.2)
Glucose, Bld: 104 mg/dL — ABNORMAL HIGH (ref 70–99)
Potassium: 4.5 mEq/L (ref 3.5–5.1)

## 2012-01-26 NOTE — Progress Notes (Signed)
Echocardiogram performed.  

## 2012-01-26 NOTE — Telephone Encounter (Signed)
Message copied by Michae Kava on Tue Jan 26, 2012  5:34 PM ------      Message from: [place], [place] T      Created: Tue Jan 26, 2012  5:11 PM       Creatinine up some      Continue current Rx       Repeat BMET in 5 days.      Richardson Dopp, PA-C  5:11 PM 01/26/2012

## 2012-01-27 DIAGNOSIS — IMO0001 Reserved for inherently not codable concepts without codable children: Secondary | ICD-10-CM | POA: Diagnosis not present

## 2012-01-27 DIAGNOSIS — I428 Other cardiomyopathies: Secondary | ICD-10-CM | POA: Diagnosis not present

## 2012-01-27 DIAGNOSIS — IMO0002 Reserved for concepts with insufficient information to code with codable children: Secondary | ICD-10-CM | POA: Diagnosis not present

## 2012-01-27 DIAGNOSIS — A0472 Enterocolitis due to Clostridium difficile, not specified as recurrent: Secondary | ICD-10-CM | POA: Diagnosis not present

## 2012-01-28 DIAGNOSIS — IMO0001 Reserved for inherently not codable concepts without codable children: Secondary | ICD-10-CM | POA: Diagnosis not present

## 2012-01-28 DIAGNOSIS — I428 Other cardiomyopathies: Secondary | ICD-10-CM | POA: Diagnosis not present

## 2012-01-28 DIAGNOSIS — IMO0002 Reserved for concepts with insufficient information to code with codable children: Secondary | ICD-10-CM | POA: Diagnosis not present

## 2012-01-28 DIAGNOSIS — A0472 Enterocolitis due to Clostridium difficile, not specified as recurrent: Secondary | ICD-10-CM | POA: Diagnosis not present

## 2012-01-29 DIAGNOSIS — A0472 Enterocolitis due to Clostridium difficile, not specified as recurrent: Secondary | ICD-10-CM | POA: Diagnosis not present

## 2012-01-29 DIAGNOSIS — IMO0002 Reserved for concepts with insufficient information to code with codable children: Secondary | ICD-10-CM | POA: Diagnosis not present

## 2012-01-29 DIAGNOSIS — I428 Other cardiomyopathies: Secondary | ICD-10-CM | POA: Diagnosis not present

## 2012-01-29 DIAGNOSIS — IMO0001 Reserved for inherently not codable concepts without codable children: Secondary | ICD-10-CM | POA: Diagnosis not present

## 2012-02-01 DIAGNOSIS — A0472 Enterocolitis due to Clostridium difficile, not specified as recurrent: Secondary | ICD-10-CM | POA: Diagnosis not present

## 2012-02-01 DIAGNOSIS — I428 Other cardiomyopathies: Secondary | ICD-10-CM | POA: Diagnosis not present

## 2012-02-01 DIAGNOSIS — IMO0001 Reserved for inherently not codable concepts without codable children: Secondary | ICD-10-CM | POA: Diagnosis not present

## 2012-02-01 DIAGNOSIS — IMO0002 Reserved for concepts with insufficient information to code with codable children: Secondary | ICD-10-CM | POA: Diagnosis not present

## 2012-02-02 ENCOUNTER — Encounter: Payer: Self-pay | Admitting: *Deleted

## 2012-02-02 DIAGNOSIS — I428 Other cardiomyopathies: Secondary | ICD-10-CM | POA: Diagnosis not present

## 2012-02-02 DIAGNOSIS — IMO0002 Reserved for concepts with insufficient information to code with codable children: Secondary | ICD-10-CM | POA: Diagnosis not present

## 2012-02-02 DIAGNOSIS — IMO0001 Reserved for inherently not codable concepts without codable children: Secondary | ICD-10-CM | POA: Diagnosis not present

## 2012-02-02 DIAGNOSIS — A0472 Enterocolitis due to Clostridium difficile, not specified as recurrent: Secondary | ICD-10-CM | POA: Diagnosis not present

## 2012-02-03 ENCOUNTER — Encounter: Payer: Self-pay | Admitting: Physician Assistant

## 2012-02-03 ENCOUNTER — Telehealth: Payer: Self-pay | Admitting: *Deleted

## 2012-02-03 ENCOUNTER — Other Ambulatory Visit (INDEPENDENT_AMBULATORY_CARE_PROVIDER_SITE_OTHER): Payer: Medicare Other

## 2012-02-03 ENCOUNTER — Ambulatory Visit (INDEPENDENT_AMBULATORY_CARE_PROVIDER_SITE_OTHER): Payer: Medicare Other | Admitting: Physician Assistant

## 2012-02-03 VITALS — BP 97/54 | HR 53 | Ht 65.0 in | Wt 169.0 lb

## 2012-02-03 DIAGNOSIS — I5181 Takotsubo syndrome: Secondary | ICD-10-CM

## 2012-02-03 DIAGNOSIS — I428 Other cardiomyopathies: Secondary | ICD-10-CM | POA: Diagnosis not present

## 2012-02-03 DIAGNOSIS — I10 Essential (primary) hypertension: Secondary | ICD-10-CM

## 2012-02-03 DIAGNOSIS — R609 Edema, unspecified: Secondary | ICD-10-CM

## 2012-02-03 DIAGNOSIS — D693 Immune thrombocytopenic purpura: Secondary | ICD-10-CM

## 2012-02-03 NOTE — Telephone Encounter (Signed)
[name] called back & stated if you could leave the verbal OK on her voicemail at [phone], she will have an order faxed over for signature.

## 2012-02-03 NOTE — Telephone Encounter (Signed)
Okay; diagnosis is adult failure to thrive; debilitation post prolonged hospitalization

## 2012-02-03 NOTE — Patient Instructions (Addendum)
Your physician has recommended you make the following change in your medication: DECREASE LASIX TO 40 MG DAILY; DECREASE POTASSIUM TO 20 MEQ 1 TABLET EVERY OTHER DAY  Your physician recommends that you return for lab work in: [place] A PRESCRIPTION TO HAVE A REPEAT BMET DONE ON 7/30 WHEN YOU GO TO YOUR HEMATOLOGIST, PLEASE HAVE THEM FAX SCOTT WEAVER, [place], [phone] FAX#  [place] APPT WITH DR. Haroldine Laws IN [place]

## 2012-02-03 NOTE — Telephone Encounter (Signed)
Left message with verbal ok and to return call with any further concerns.

## 2012-02-03 NOTE — Telephone Encounter (Signed)
Verbal ok to extend the physical therapy to help with Pt balancing issue. This is for a total of 6 more visit.Please advise

## 2012-02-03 NOTE — Progress Notes (Signed)
[facility] [place], [place]  [address] Phone: [phone] Fax:  [phone]  Date:  02/03/2012   Name:  [name]   DOB:  28-Oct-1922   MRN:  [id number]  PCP:  Unice Cobble, MD  Primary Cardiologist:  Dr. Glori Bickers  Primary Electrophysiologist:  None    History of Present Illness: [name] is a 78 y.o. female who returns for follow up.  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  LHC in 6/05 in setting of NSTEMI: ant apical AK and inf-apical AK, EF 29%, dOM2 70-80% (small); findings c/w Tako-Tsubo CM.  EF recovered.    Echo 10/09: EF 55%, mild LVH, mild AI, mild MAC, mild MR, mild LAE, mild PR, mild TR, mild to mod increased peak PASP.    Also has a h/o HTN, SVT, ITP and LE edema.    Last seen by Dr. Glori Bickers in 09/2010.  Edema has been managed in past with Lasix.  Admitted to [facility] in 09/2011 with a cervical spine fracture.  She did not have surgery.  She remained there for 4 weeks and then went to outpatient rehabilitation for 4 weeks.  Admitted to [facility] in 12/2011 with CDiff colitis.    I saw her 01/19/12 with complaints of worsening LE edema.  I adjusted her Lasix.  Labs indicated a fairly stable creatinine and hemoglobin.  Platelet count was somewhat lower and she was asked to follow up with her hematologist.  Her albumin was low and she was asked to increase her protein intake.  She had no proteinuria on urinalysis.  Follow up echo 01/26/12: EF 55%, mild LAE, PASP 34.  She is doing better.  Her activity is increasing.  Her LE edema is improved.  She denies significant dyspnea with exertion.  She denies chest pain.  She denies orthopnea, PND.  She denies lightheadedness or dizziness  Wt Readings from Last 3 Encounters:  02/03/12 169 lb (76.658 kg)  01/19/12 179 lb (81.194 kg)  12/31/11 184 lb (83.462 kg)    Potassium  Date/Time Value Range Status    01/26/2012 11:14 AM 4.5  3.5 - 5.1 mEq/L Final  01/19/2012  3:26 PM 3.5  3.5 - 5.1 mEq/L Final  12/30/2011 10:36 AM 3.6  3.5 - 5.1 mEq/L Final     Creatinine, Ser  Date/Time Value Range Status  01/26/2012 11:14 AM 1.6* 0.4 - 1.2 mg/dL Final  01/19/2012  3:26 PM 1.3* 0.4 - 1.2 mg/dL Final  12/30/2011 10:36 AM 0.89  0.50 - 1.10 mg/dL Final    Past Medical History  Diagnosis Date  . 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 Resolved. --dx'd on cath 2005. EF 29% with minimal distal CAD (small OM1 70-80).  Echo 10/09 EF 55%;  echo 01/26/12: EF 55%, mild LAE, PASP 34.  Marland Kitchen Hypertension     SEVERE  . Idiopathic thrombocytopenic purpura (ITP)     DR. Jamse Arn  . Granuloma annulare   . History of phlebitis   . Lower extremity edema   . Osteoporosis   . Colitis, ischemic   . Fall     with non-healing rib fractures  . Lumbar stenosis 2004    DR. MARK ROY  . AV block, 1st degree   . Complication of anesthesia     hard to wake up  . CAD (coronary artery disease)     Non ST elevation 2005 ;  LHC in 6/05 in setting of NSTEMI: ant apical AK and inf-apical AK, EF 29%, dOM2 70-80% (small); findings c/w Tako-Tsubo CM  . Compression fracture of C-spine 10/10/2011    Fell at home, tx at [facility]  . Diarrhea associated with pseudomembranous colitis 6/11-17/2013    [facility]  . Bradycardia   . Dyslipidemia     Current Outpatient Prescriptions  Medication Sig Dispense Refill  . [place] Referral: Arville Go   Dr.William F. Linna Darner will manage patient care/orders  1.) Skilled Nursing to evaluate and monitor 2.) Physical Therapy, 2 x weekly for 4 weeks Home Health Referral: [facility]  1 each  0  . benazepril (LOTENSIN) 20 MG tablet Take 1 tablet (20 mg total) by mouth daily.  30 tablet  11  . beta carotene w/minerals (OCUVITE) tablet Take 1 tablet by mouth 2 (two) times daily.       . brimonidine (ALPHAGAN) 0.2 % ophthalmic solution Place 1 drop into  the right eye 2 (two) times daily.        . carboxymethylcellulose (REFRESH TEARS) 0.5 % SOLN Place 1 drop into both eyes 2 (two) times daily.       . carvedilol (COREG) 25 MG tablet Take 25 mg by mouth 2 (two) times daily with a meal.       . fluticasone (FLONASE) 50 MCG/ACT nasal spray USE 1 SPRAY IN EACH NOSTRIL TWICE A DAY  16 g  3  . furosemide (LASIX) 40 MG tablet FOR 2 DAYS TAKE 60 MG TWICE DAILY; AFTER THE 2 DAYS THEN START 40 MG TWICE DAILY  90 tablet  6  . Misc Natural Products (OSTEO BI-FLEX ADV JOINT SHIELD) TABS Take 2 tablets by mouth daily.        Marland Kitchen omeprazole (PRILOSEC) 20 MG capsule Take 20 mg by mouth daily.        . potassium chloride SA (K-DUR,KLOR-CON) 20 MEQ tablet FOR 2 DAYS TAKE 1 TABLET TWICE DAILY; AFTER THE 2 DAYS START POTASSIUM 20 MEQ DAILY  90 tablet  6  . timolol (TIMOPTIC) 0.25 % ophthalmic solution Place 1 drop into the right eye 2 (two) times daily.          Allergies: Allergies  Allergen Reactions  . Penicillins     REACTION: joint edema  . Alendronate Sodium   . Carvedilol   . Colchicine     REACTION: hair loss  . Norvasc (Amlodipine Besylate)   . Streptomycin   . Morphine And Related     nausea    History  Substance Use Topics  . Smoking status: Never Smoker   . Smokeless tobacco: Never Used  . Alcohol Use: No     ROS:  Please see the history of present illness.   All other systems reviewed and negative.   PHYSICAL EXAM: VS:  BP 97/54  Pulse 53  Ht 5\' 5"  (1.651 m)  Wt 169 lb (76.658 kg)  BMI 28.12 kg/m2 Well nourished, well developed, in no acute distress HEENT: normal Neck: Cervical collar in place Cardiac:  normal S1, S2; RRR; no murmur Lungs:  Coarse Breath sounds at the bases bilaterally, no wheezing, rhonchi  Abd: soft, nontender, no hepatomegaly Ext: 1+ bilateral LE edema to the knees Skin: warm and dry Neuro:  CNs 2-12 intact, no focal abnormalities noted  EKG:  Sinus bradycardia, heart rate 53, left axis deviation,  nonspecific ST-T wave changes, no change from prior   ASSESSMENT AND PLAN:  1.  Edema Edema much improved.  She is down 10 pounds.  Decrease Lasix to 40 mg daily and potassium 20 mEq every other day.  Her creatinine had increased recently.  She is due for repeat basic metabolic panel today.  Check another basic metabolic panel in about a week.  I suspect her edema is multifactorial.  She does have compression stockings at home.  I've asked her to try to use these.  She knows to contact us if her edema worsens.  2.  Tako-Tsubo Cardiomyopathy Ejection fraction remains normal.  Continue current therapy.  Follow up with Dr. Haroldine Laws as planned.  3.  Hypertension Controlled.  4.  Idiopathic thrombocytopenic purpura Followed by hematology.  SignedRichardson Dopp, PA-C  11:49 AM 02/03/2012

## 2012-02-03 NOTE — Telephone Encounter (Signed)
Left message to call office

## 2012-02-04 ENCOUNTER — Telehealth: Payer: Self-pay | Admitting: *Deleted

## 2012-02-04 ENCOUNTER — Other Ambulatory Visit (INDEPENDENT_AMBULATORY_CARE_PROVIDER_SITE_OTHER): Payer: Medicare Other

## 2012-02-04 DIAGNOSIS — I428 Other cardiomyopathies: Secondary | ICD-10-CM

## 2012-02-04 DIAGNOSIS — R609 Edema, unspecified: Secondary | ICD-10-CM

## 2012-02-04 DIAGNOSIS — IMO0002 Reserved for concepts with insufficient information to code with codable children: Secondary | ICD-10-CM | POA: Diagnosis not present

## 2012-02-04 DIAGNOSIS — IMO0001 Reserved for inherently not codable concepts without codable children: Secondary | ICD-10-CM | POA: Diagnosis not present

## 2012-02-04 DIAGNOSIS — A0472 Enterocolitis due to Clostridium difficile, not specified as recurrent: Secondary | ICD-10-CM | POA: Diagnosis not present

## 2012-02-04 LAB — BASIC METABOLIC PANEL
BUN: 22 mg/dL (ref 6–23)
CO2: 26 mEq/L (ref 19–32)
Calcium: 9.8 mg/dL (ref 8.4–10.5)
Creatinine, Ser: 1.2 mg/dL (ref 0.4–1.2)
GFR: 46.93 mL/min — ABNORMAL LOW (ref >60.00)
Glucose, Bld: 84 mg/dL (ref 70–99)
Sodium: 137 mEq/L (ref 135–145)

## 2012-02-04 NOTE — Telephone Encounter (Signed)
Pt called and notified of lab results. Concepcion Living, CMA

## 2012-02-04 NOTE — Telephone Encounter (Signed)
Message copied by Iona Hansen on Thu Feb 04, 2012  4:15 PM ------      Message from: [place], [place] T      Created: Thu Feb 04, 2012  2:14 PM       Please notify patient that the lab results are ok.      Richardson Dopp, PA-C  2:14 PM 02/04/2012

## 2012-02-05 DIAGNOSIS — I428 Other cardiomyopathies: Secondary | ICD-10-CM | POA: Diagnosis not present

## 2012-02-05 DIAGNOSIS — IMO0002 Reserved for concepts with insufficient information to code with codable children: Secondary | ICD-10-CM | POA: Diagnosis not present

## 2012-02-05 DIAGNOSIS — A0472 Enterocolitis due to Clostridium difficile, not specified as recurrent: Secondary | ICD-10-CM | POA: Diagnosis not present

## 2012-02-05 DIAGNOSIS — IMO0001 Reserved for inherently not codable concepts without codable children: Secondary | ICD-10-CM | POA: Diagnosis not present

## 2012-02-05 DIAGNOSIS — Z0279 Encounter for issue of other medical certificate: Secondary | ICD-10-CM

## 2012-02-08 DIAGNOSIS — A0472 Enterocolitis due to Clostridium difficile, not specified as recurrent: Secondary | ICD-10-CM | POA: Diagnosis not present

## 2012-02-08 DIAGNOSIS — IMO0001 Reserved for inherently not codable concepts without codable children: Secondary | ICD-10-CM | POA: Diagnosis not present

## 2012-02-08 DIAGNOSIS — IMO0002 Reserved for concepts with insufficient information to code with codable children: Secondary | ICD-10-CM | POA: Diagnosis not present

## 2012-02-08 DIAGNOSIS — I428 Other cardiomyopathies: Secondary | ICD-10-CM | POA: Diagnosis not present

## 2012-02-09 ENCOUNTER — Other Ambulatory Visit: Payer: Self-pay | Admitting: Hematology and Oncology

## 2012-02-09 ENCOUNTER — Ambulatory Visit (HOSPITAL_BASED_OUTPATIENT_CLINIC_OR_DEPARTMENT_OTHER): Payer: Medicare Other | Admitting: Hematology and Oncology

## 2012-02-09 ENCOUNTER — Telehealth: Payer: Self-pay | Admitting: Hematology and Oncology

## 2012-02-09 ENCOUNTER — Encounter: Payer: Self-pay | Admitting: Hematology and Oncology

## 2012-02-09 ENCOUNTER — Other Ambulatory Visit (HOSPITAL_BASED_OUTPATIENT_CLINIC_OR_DEPARTMENT_OTHER): Payer: Medicare Other | Admitting: Lab

## 2012-02-09 VITALS — BP 157/88 | HR 103 | Temp 97.0°F | Ht 65.0 in | Wt 171.5 lb

## 2012-02-09 DIAGNOSIS — D696 Thrombocytopenia, unspecified: Secondary | ICD-10-CM

## 2012-02-09 DIAGNOSIS — E538 Deficiency of other specified B group vitamins: Secondary | ICD-10-CM

## 2012-02-09 DIAGNOSIS — D539 Nutritional anemia, unspecified: Secondary | ICD-10-CM | POA: Diagnosis not present

## 2012-02-09 DIAGNOSIS — D693 Immune thrombocytopenic purpura: Secondary | ICD-10-CM | POA: Diagnosis not present

## 2012-02-09 LAB — CBC WITH DIFFERENTIAL/PLATELET
BASO%: 1.2 % (ref 0.0–2.0)
Basophils Absolute: 0.1 10*3/uL (ref 0.0–0.1)
EOS%: 6.2 % (ref 0.0–7.0)
HGB: 10.5 g/dL — ABNORMAL LOW (ref 11.6–15.9)
MCH: 32.9 pg (ref 25.1–34.0)
MCHC: 33.9 g/dL (ref 31.5–36.0)
MCV: 97 fL (ref 79.5–101.0)
MONO%: 6.8 % (ref 0.0–14.0)
RBC: 3.19 10*6/uL — ABNORMAL LOW (ref 3.70–5.45)
RDW: 13.8 % (ref 11.2–14.5)
lymph#: 1.3 10*3/uL (ref 0.9–3.3)

## 2012-02-09 LAB — COMPREHENSIVE METABOLIC PANEL
Alkaline Phosphatase: 105 U/L (ref 39–117)
BUN: 28 mg/dL — ABNORMAL HIGH (ref 6–23)
CO2: 26 mEq/L (ref 19–32)
Creatinine, Ser: 1.47 mg/dL — ABNORMAL HIGH (ref 0.50–1.10)
Glucose, Bld: 95 mg/dL (ref 70–99)
Total Bilirubin: 0.3 mg/dL (ref 0.3–1.2)
Total Protein: 6 g/dL (ref 6.0–8.3)

## 2012-02-09 LAB — FERRITIN: Ferritin: 48 ng/mL (ref 10–291)

## 2012-02-09 LAB — IRON AND TIBC
%SAT: 31 % (ref 20–55)
Iron: 91 ug/dL (ref 42–145)
TIBC: 290 ug/dL (ref 250–470)
UIBC: 199 ug/dL (ref 125–400)

## 2012-02-09 LAB — FOLATE: Folate: 8.4 ng/mL

## 2012-02-09 NOTE — Progress Notes (Signed)
This office note has been dictated.

## 2012-02-09 NOTE — Patient Instructions (Addendum)
[name]  [id number]  [place] Discharge Instructions  RECOMMENDATIONS MADE BY THE CONSULTANT AND ANY TEST RESULTS WILL BE SENT TO YOUR REFERRING DOCTOR.   EXAM FINDINGS BY MD TODAY AND SIGNS AND SYMPTOMS TO REPORT TO CLINIC OR PRIMARY MD:   Your current list of medications are: Current Outpatient Prescriptions  Medication Sig Dispense Refill  . [place] Referral: Arville Go   Dr.William F. Linna Darner will manage patient care/orders  1.) Skilled Nursing to evaluate and monitor 2.) Physical Therapy, 2 x weekly for 4 weeks Home Health Referral: [facility]  1 each  0  . benazepril (LOTENSIN) 20 MG tablet Take 1 tablet (20 mg total) by mouth daily.  30 tablet  11  . beta carotene w/minerals (OCUVITE) tablet Take 1 tablet by mouth 2 (two) times daily.       . brimonidine (ALPHAGAN) 0.2 % ophthalmic solution Place 1 drop into the right eye 2 (two) times daily.        . carboxymethylcellulose (REFRESH TEARS) 0.5 % SOLN Place 1 drop into both eyes 2 (two) times daily.       . carvedilol (COREG) 25 MG tablet Take 25 mg by mouth 2 (two) times daily with a meal.       . fluticasone (FLONASE) 50 MCG/ACT nasal spray USE 1 SPRAY IN EACH NOSTRIL TWICE A DAY  16 g  3  . furosemide (LASIX) 40 MG tablet Take 1 tablet (40 mg total) by mouth daily.      . Misc Natural Products (OSTEO BI-FLEX ADV JOINT SHIELD) TABS Take 2 tablets by mouth daily.        Marland Kitchen omeprazole (PRILOSEC) 20 MG capsule Take 20 mg by mouth daily.        . potassium chloride SA (K-DUR,KLOR-CON) 20 MEQ tablet TAKE 1 TABLET EVERY OTHER DAY      . timolol (TIMOPTIC) 0.25 % ophthalmic solution Place 1 drop into the right eye 2 (two) times daily.           INSTRUCTIONS GIVEN AND DISCUSSED:   SPECIAL INSTRUCTIONS/FOLLOW-UP:  See above.  I acknowledge that I have been informed and understand all the instructions given to me and received a copy. I do not have any more questions at this  time, but understand that I may call the [place] at [phone] during business hours should I have any further questions or need assistance in obtaining follow-up care.

## 2012-02-09 NOTE — Progress Notes (Signed)
CC:   Darrick Penna. Linna Darner, MD,FACP,FCCP  IDENTIFYING STATEMENT:  The patient is an 78 year old woman with ITP who presents for followup.  INTERVAL HISTORY:  Ms. Moffa fell in March and fractured her cervical bone.  She was managed with a neck brace and rehab.  She also contracted C difficile and pneumonia.  She is back to baseline.  She tells me that she discontinued medications that were prescribed for her disseminated granuloma annulare.  She currently has no problems with bleeding or bruising.  She reports good energy levels.  Recently re-initiated vitamin B12 monthly.  MEDICATIONS:  Reviewed and updated.  PHYSICAL EXAMINATION:  General:  A well-appearing woman in no current distress.  Vitals:  Pulse 103, blood pressure 157/88, temperature 97, respirations 20, weight 171.5 pounds.  HEENT:  Head is atraumatic, normocephalic.  Sclerae anicteric.  Mouth moist.  Chest:  Clear. Abdomen:  Soft, nontender.  Bowel sounds present.  Extremities:  No edema.  Skin:  No petechiae.  LABORATORY DATA:  01/23/2012 white cell count 4.7, hemoglobin 10.5, hematocrit 31, platelets 65 (93).  CMET, B12 pending.  IMPRESSION AND PLAN:  Ms. Clauss is an 78 year old woman with ITP (idiopathic thrombocytopenic purpura).  She has never been treated.  The platelets are in the mid 60s and she has no evidence of bleeding.  She has also had a recent illness. She will continue observation and will continue to receive vitamin B12 supplementation every month.  Anemia panel will be included in labs drawn today.   She follows up in 3 months' time.    ______________________________ Nira Retort, M.D. LIO/MEDQ  D:  02/09/2012  T:  02/09/2012  Job:  [id number]

## 2012-02-09 NOTE — Telephone Encounter (Signed)
appts made and printed for pt aom °

## 2012-02-10 DIAGNOSIS — A0472 Enterocolitis due to Clostridium difficile, not specified as recurrent: Secondary | ICD-10-CM | POA: Diagnosis not present

## 2012-02-10 DIAGNOSIS — IMO0002 Reserved for concepts with insufficient information to code with codable children: Secondary | ICD-10-CM | POA: Diagnosis not present

## 2012-02-10 DIAGNOSIS — I428 Other cardiomyopathies: Secondary | ICD-10-CM | POA: Diagnosis not present

## 2012-02-10 DIAGNOSIS — IMO0001 Reserved for inherently not codable concepts without codable children: Secondary | ICD-10-CM | POA: Diagnosis not present

## 2012-02-11 DIAGNOSIS — I428 Other cardiomyopathies: Secondary | ICD-10-CM | POA: Diagnosis not present

## 2012-02-11 DIAGNOSIS — A0472 Enterocolitis due to Clostridium difficile, not specified as recurrent: Secondary | ICD-10-CM | POA: Diagnosis not present

## 2012-02-11 DIAGNOSIS — IMO0002 Reserved for concepts with insufficient information to code with codable children: Secondary | ICD-10-CM | POA: Diagnosis not present

## 2012-02-11 DIAGNOSIS — IMO0001 Reserved for inherently not codable concepts without codable children: Secondary | ICD-10-CM | POA: Diagnosis not present

## 2012-02-12 ENCOUNTER — Telehealth: Payer: Self-pay | Admitting: *Deleted

## 2012-02-12 ENCOUNTER — Telehealth: Payer: Self-pay | Admitting: Nurse Practitioner

## 2012-02-12 DIAGNOSIS — I428 Other cardiomyopathies: Secondary | ICD-10-CM | POA: Diagnosis not present

## 2012-02-12 DIAGNOSIS — IMO0002 Reserved for concepts with insufficient information to code with codable children: Secondary | ICD-10-CM | POA: Diagnosis not present

## 2012-02-12 DIAGNOSIS — IMO0001 Reserved for inherently not codable concepts without codable children: Secondary | ICD-10-CM | POA: Diagnosis not present

## 2012-02-12 DIAGNOSIS — I10 Essential (primary) hypertension: Secondary | ICD-10-CM

## 2012-02-12 DIAGNOSIS — R609 Edema, unspecified: Secondary | ICD-10-CM

## 2012-02-12 DIAGNOSIS — A0472 Enterocolitis due to Clostridium difficile, not specified as recurrent: Secondary | ICD-10-CM | POA: Diagnosis not present

## 2012-02-12 NOTE — Telephone Encounter (Signed)
Message copied by Earlie Counts on Fri Feb 12, 2012  2:23 PM ------      Message from: Sophronia Simas I      Created: Fri Feb 12, 2012  1:12 PM       Call Mrs Kennard - ferritin a little low.  Can she begin OTC iron BID.  Avoid constipation.            Thanks

## 2012-02-12 NOTE — Telephone Encounter (Signed)
Informed pt per Dr. Hollice Espy not below.

## 2012-02-12 NOTE — Telephone Encounter (Signed)
Spoke with pt and reviewed lab results faxed to Richardson Dopp, [place] from [facility]. Pt will come in for BMP on February 24, 2012.

## 2012-02-16 ENCOUNTER — Ambulatory Visit (HOSPITAL_BASED_OUTPATIENT_CLINIC_OR_DEPARTMENT_OTHER): Payer: Medicare Other

## 2012-02-16 VITALS — BP 168/75 | HR 67 | Temp 97.4°F

## 2012-02-16 DIAGNOSIS — D510 Vitamin B12 deficiency anemia due to intrinsic factor deficiency: Secondary | ICD-10-CM

## 2012-02-16 DIAGNOSIS — E538 Deficiency of other specified B group vitamins: Secondary | ICD-10-CM

## 2012-02-16 MED ORDER — CYANOCOBALAMIN 1000 MCG/ML IJ SOLN
1000.0000 ug | Freq: Once | INTRAMUSCULAR | Status: AC
  Administered 2012-02-16: 1000 ug via INTRAMUSCULAR

## 2012-02-17 DIAGNOSIS — IMO0002 Reserved for concepts with insufficient information to code with codable children: Secondary | ICD-10-CM | POA: Diagnosis not present

## 2012-02-17 DIAGNOSIS — A0472 Enterocolitis due to Clostridium difficile, not specified as recurrent: Secondary | ICD-10-CM | POA: Diagnosis not present

## 2012-02-17 DIAGNOSIS — I428 Other cardiomyopathies: Secondary | ICD-10-CM | POA: Diagnosis not present

## 2012-02-17 DIAGNOSIS — IMO0001 Reserved for inherently not codable concepts without codable children: Secondary | ICD-10-CM | POA: Diagnosis not present

## 2012-02-18 DIAGNOSIS — IMO0002 Reserved for concepts with insufficient information to code with codable children: Secondary | ICD-10-CM | POA: Diagnosis not present

## 2012-02-18 DIAGNOSIS — I428 Other cardiomyopathies: Secondary | ICD-10-CM | POA: Diagnosis not present

## 2012-02-18 DIAGNOSIS — IMO0001 Reserved for inherently not codable concepts without codable children: Secondary | ICD-10-CM | POA: Diagnosis not present

## 2012-02-18 DIAGNOSIS — A0472 Enterocolitis due to Clostridium difficile, not specified as recurrent: Secondary | ICD-10-CM | POA: Diagnosis not present

## 2012-02-19 DIAGNOSIS — IMO0001 Reserved for inherently not codable concepts without codable children: Secondary | ICD-10-CM | POA: Diagnosis not present

## 2012-02-19 DIAGNOSIS — IMO0002 Reserved for concepts with insufficient information to code with codable children: Secondary | ICD-10-CM | POA: Diagnosis not present

## 2012-02-19 DIAGNOSIS — A0472 Enterocolitis due to Clostridium difficile, not specified as recurrent: Secondary | ICD-10-CM | POA: Diagnosis not present

## 2012-02-19 DIAGNOSIS — I428 Other cardiomyopathies: Secondary | ICD-10-CM | POA: Diagnosis not present

## 2012-02-24 ENCOUNTER — Other Ambulatory Visit (INDEPENDENT_AMBULATORY_CARE_PROVIDER_SITE_OTHER): Payer: Medicare Other

## 2012-02-24 DIAGNOSIS — I10 Essential (primary) hypertension: Secondary | ICD-10-CM | POA: Diagnosis not present

## 2012-02-24 DIAGNOSIS — R609 Edema, unspecified: Secondary | ICD-10-CM | POA: Diagnosis not present

## 2012-02-24 LAB — BASIC METABOLIC PANEL
CO2: 27 mEq/L (ref 19–32)
Calcium: 9 mg/dL (ref 8.4–10.5)
Creatinine, Ser: 1.3 mg/dL — ABNORMAL HIGH (ref 0.4–1.2)
GFR: 42.65 mL/min — ABNORMAL LOW (ref >60.00)
Sodium: 139 mEq/L (ref 135–145)

## 2012-02-25 ENCOUNTER — Telehealth: Payer: Self-pay | Admitting: *Deleted

## 2012-02-25 NOTE — Telephone Encounter (Signed)
pt notified of lab results

## 2012-02-25 NOTE — Telephone Encounter (Signed)
Message copied by Michae Kava on Thu Feb 25, 2012 11:28 AM ------      Message from: [place], [place] T      Created: Wed Feb 24, 2012  5:45 PM       Stable      Richardson Dopp, [place]  5:44 PM 02/24/2012

## 2012-03-01 ENCOUNTER — Encounter (HOSPITAL_COMMUNITY): Payer: Medicare Other

## 2012-03-03 ENCOUNTER — Ambulatory Visit (HOSPITAL_COMMUNITY)
Admission: RE | Admit: 2012-03-03 | Discharge: 2012-03-03 | Disposition: A | Discharge status: Home / Self Care | Payer: Medicare Other | Source: Ambulatory Visit | Attending: Internal Medicine | Admitting: Internal Medicine

## 2012-03-03 ENCOUNTER — Encounter (HOSPITAL_COMMUNITY): Payer: Self-pay

## 2012-03-03 VITALS — BP 140/82 | HR 56 | Ht 66.0 in | Wt 170.0 lb

## 2012-03-03 DIAGNOSIS — I509 Heart failure, unspecified: Secondary | ICD-10-CM | POA: Diagnosis not present

## 2012-03-03 DIAGNOSIS — I5032 Chronic diastolic (congestive) heart failure: Secondary | ICD-10-CM | POA: Diagnosis not present

## 2012-03-03 DIAGNOSIS — I10 Essential (primary) hypertension: Secondary | ICD-10-CM

## 2012-03-03 MED ORDER — SPIRONOLACTONE 25 MG PO TABS: 25.0000 mg | ORAL_TABLET | Freq: Every day | ORAL | Status: DC

## 2012-03-03 NOTE — Assessment & Plan Note (Addendum)
Although she is doing fairly well. I think she likely has 8-10 ponds of fluid on board. Will start Spironolactone 12.5 mg daily. Reinforced need for daily weights and reviewed use of sliding scale diuretics. She is instructed to take an additional Lasix in her weight is 170 or greater. Follow up in 2 weeks. Repeat BMET at that time.

## 2012-03-03 NOTE — Progress Notes (Signed)
Patient ID: CERES AILLS, female   DOB: 02/18/1923, 78 y.o.   MRN: [id number] HPI: [name] is a 78 y.o. female who returns for evaluation of edema.   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 LHC in 6/05 in setting of NSTEMI: ant apical AK and inf-apical AK, EF 29%, dOM2 70-80% (small). EF has recovered. Also has a h/o HTN, SVT and LE edema.   In 6/13 presented to ED with tachycardia after reducing carvedilol to 12.5 mg bid. Carvedilol increased to 25 mg twice a day.   7/13 ECHO EF 55% 02/2012 Potassium 4.2 Creatinine 1.3  She returns for follow up. In July she started taking lasix 1-2 times daily titrating as needed. Intermittent dyspnea walking up stairs - no change. Weight reduced to 167 pounds. Swelling in lower extremity improved. No orthopnea, PND or CP.    ROS: All systems negative except as listed in HPI, PMH and Problem List.  Past Medical History  Diagnosis Date  . 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 Resolved. --dx'd on cath 2005. EF 29% with minimal distal CAD (small OM1 70-80).  Echo 10/09 EF 55%;  echo 01/26/12: EF 55%, mild LAE, PASP 34.  Marland Kitchen Hypertension     SEVERE  . Idiopathic thrombocytopenic purpura (ITP)     DR. Jamse Arn  . Granuloma annulare   . History of phlebitis   . Lower extremity edema   . Osteoporosis   . Colitis, ischemic   . Fall     with non-healing rib fractures  . Lumbar stenosis 2004    DR. MARK ROY  . AV block, 1st degree   . Complication of anesthesia     hard to wake up  . CAD (coronary artery disease)     Non ST elevation 2005 ; LHC in 6/05 in setting of NSTEMI: ant apical AK and inf-apical AK, EF 29%, dOM2 70-80% (small); findings c/w Tako-Tsubo CM  . Compression fracture of C-spine 10/10/2011    Fell at home, tx at [facility]  . Diarrhea associated with pseudomembranous colitis 6/11-17/2013    [facility]  . Bradycardia   . Dyslipidemia     Current Outpatient Prescriptions    Medication Sig Dispense Refill  . [place] Referral: Arville Go   Dr.William F. Linna Darner will manage patient care/orders  1.) Skilled Nursing to evaluate and monitor 2.) Physical Therapy, 2 x weekly for 4 weeks Home Health Referral: [facility]  1 each  0  . benazepril (LOTENSIN) 20 MG tablet Take 1 tablet (20 mg total) by mouth daily.  30 tablet  11  . beta carotene w/minerals (OCUVITE) tablet Take 1 tablet by mouth 2 (two) times daily.       . Bisacodyl (DULCOLAX PO) Take 1 tablet by mouth daily.      . brimonidine (ALPHAGAN) 0.2 % ophthalmic solution Place 1 drop into the right eye 2 (two) times daily.        . carboxymethylcellulose (REFRESH TEARS) 0.5 % SOLN Place 1 drop into both eyes 2 (two) times daily.       . carvedilol (COREG) 25 MG tablet Take 25 mg by mouth 2 (two) times daily with a meal.       . Ferrous Sulfate (IRON) 325 (65 FE) MG TABS Take 1 tablet by mouth 2 (two) times daily.      . fluticasone (FLONASE) 50 MCG/ACT nasal spray USE 1 SPRAY IN EACH NOSTRIL TWICE A  DAY  16 g  3  . furosemide (LASIX) 40 MG tablet Take 1 tablet (40 mg total) by mouth daily.      . Misc Natural Products (OSTEO BI-FLEX ADV JOINT SHIELD) TABS Take 2 tablets by mouth daily.        Marland Kitchen omeprazole (PRILOSEC) 20 MG capsule Take 20 mg by mouth daily.        . potassium chloride SA (K-DUR,KLOR-CON) 20 MEQ tablet TAKE 1 TABLET EVERY OTHER DAY      . timolol (TIMOPTIC) 0.25 % ophthalmic solution Place 1 drop into the right eye 2 (two) times daily.           PHYSICAL EXAM: Filed Vitals:   03/03/12 1036  BP: 140/82  Pulse: 56   Weight change:  General:  Elderly Well appearing. No resp difficulty HEENT: normal Neck: supple. JVP 9-10. Carotids 2+ bilaterally; no bruits. No lymphadenopathy or thryomegaly appreciated. Cor: PMI normal. Regular rate & rhythm. No rubs, gallops or murmurs. Lungs: clear Abdomen: soft, nontender, nondistended. No hepatosplenomegaly. No bruits  or masses. Good bowel sounds. Extremities: no cyanosis, clubbing, rash, R and LLE 1-2+edema. Granuloma annulare lesions Neuro: alert & orientedx3, cranial nerves grossly intact. Moves all 4 extremities w/o difficulty. Affect pleasant.      ASSESSMENT & PLAN:

## 2012-03-03 NOTE — Patient Instructions (Addendum)
Take Spironoactone 12.5 mg once a day  If your weight is > than 170 pounds take an additional Lasix.   Do the following things EVERYDAY: 1) Weigh yourself in the morning before breakfast. Write it down and keep it in a log. 2) Take your medicines as prescribed 3) Eat low salt foods-Limit salt (sodium) to 2000 mg per day.  4) Stay as active as you can everyday 5) Limit all fluids for the day to less than 2 liters  Please wear TED hose daily  Check BMET next Thursday  Follow up in 2 weeks.

## 2012-03-06 NOTE — Assessment & Plan Note (Signed)
BP upper limit of normal. Will not push too hard given her age and risk of symptomatic hypotension.

## 2012-03-10 ENCOUNTER — Other Ambulatory Visit (INDEPENDENT_AMBULATORY_CARE_PROVIDER_SITE_OTHER): Payer: Medicare Other

## 2012-03-10 DIAGNOSIS — I5022 Chronic systolic (congestive) heart failure: Secondary | ICD-10-CM

## 2012-03-10 LAB — BASIC METABOLIC PANEL
BUN: 38 mg/dL — ABNORMAL HIGH (ref 6–23)
Chloride: 105 mEq/L (ref 96–112)
Creatinine, Ser: 2 mg/dL — ABNORMAL HIGH (ref 0.4–1.2)
GFR: 24.88 mL/min — ABNORMAL LOW (ref >60.00)
Potassium: 4.7 mEq/L (ref 3.5–5.1)

## 2012-03-11 NOTE — Progress Notes (Signed)
Labs only

## 2012-03-15 ENCOUNTER — Ambulatory Visit (HOSPITAL_BASED_OUTPATIENT_CLINIC_OR_DEPARTMENT_OTHER): Payer: Medicare Other

## 2012-03-15 VITALS — BP 111/65 | HR 58 | Temp 98.2°F

## 2012-03-15 DIAGNOSIS — D510 Vitamin B12 deficiency anemia due to intrinsic factor deficiency: Secondary | ICD-10-CM | POA: Diagnosis not present

## 2012-03-15 MED ORDER — CYANOCOBALAMIN 1000 MCG/ML IJ SOLN
1000.0000 ug | Freq: Once | INTRAMUSCULAR | Status: AC
  Administered 2012-03-15: 1000 ug via INTRAMUSCULAR

## 2012-03-16 ENCOUNTER — Ambulatory Visit (HOSPITAL_COMMUNITY)
Admission: RE | Admit: 2012-03-16 | Discharge: 2012-03-16 | Disposition: A | Discharge status: Home / Self Care | Payer: Medicare Other | Source: Ambulatory Visit | Attending: Internal Medicine | Admitting: Internal Medicine

## 2012-03-16 ENCOUNTER — Encounter (HOSPITAL_COMMUNITY): Payer: Self-pay

## 2012-03-16 VITALS — BP 148/62 | HR 66 | Ht 66.0 in | Wt 167.1 lb

## 2012-03-16 DIAGNOSIS — I509 Heart failure, unspecified: Secondary | ICD-10-CM | POA: Insufficient documentation

## 2012-03-16 DIAGNOSIS — I5032 Chronic diastolic (congestive) heart failure: Secondary | ICD-10-CM

## 2012-03-16 LAB — BASIC METABOLIC PANEL
BUN: 55 mg/dL — ABNORMAL HIGH (ref 6–23)
Chloride: 105 mEq/L (ref 96–112)
GFR calc Af Amer: 19 mL/min — ABNORMAL LOW (ref >90)
Glucose, Bld: 99 mg/dL (ref 70–99)
Potassium: 5.2 mEq/L — ABNORMAL HIGH (ref 3.5–5.1)
Sodium: 142 mEq/L (ref 135–145)

## 2012-03-16 MED ORDER — SPIRONOLACTONE 25 MG PO TABS: 12.5000 mg | ORAL_TABLET | Freq: Every day | ORAL | Status: DC

## 2012-03-16 NOTE — Patient Instructions (Addendum)
Take Spironolactone 12.5 mg daily  Follow up in 3 weeks.   Do the following things EVERYDAY: 1) Weigh yourself in the morning before breakfast. Write it down and keep it in a log. 2) Take your medicines as prescribed 3) Eat low salt foods-Limit salt (sodium) to 2000 mg per day.  4) Stay as active as you can everyday 5) Limit all fluids for the day to less than 2 liters

## 2012-03-16 NOTE — Progress Notes (Signed)
Patient ID: [name], female   DOB: 1923/03/11, 78 y.o.   MRN: [id number] HPI: [name] is a 78 y.o. female who returns for evaluation of edema.   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 LHC in 6/05 in setting of NSTEMI: ant apical AK and inf-apical AK, EF 29%, dOM2 70-80% (small). EF has recovered. Also has a h/o HTN, SVT and LE edema. Discharged form [facility] after a fall which resulted in a cervical fracture.   In 6/13 presented to ED with tachycardia after reducing carvedilol to 12.5 mg bid. Carvedilol increased to 25 mg twice a day.   7/13 ECHO EF 55% 02/2012 Potassium 4.2 Creatinine 1.3 03/10/12 Potassium 4.7 Creatinine 2.0   She returns for follow up. Last visit she started Spironolactone 12.5 mg daily and she was instructed to take an additional Lasix in her weight is 170 or greater. She has been taking Spironolactone 25 mg day. Breathing better. Mild nausea daily after after taking medication. Spironolactone only new medication added.  Denies SOB/PND/Orthopnea. Mild dyspnea going up inclined surfaces. Denies dizziness. Weight decreased to 165 pounds. Decreased lower extremity edema.     ROS: All systems negative except as listed in HPI, PMH and Problem List.  Past Medical History  Diagnosis Date  . 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 Resolved. --dx'd on cath 2005. EF 29% with minimal distal CAD (small OM1 70-80).  Echo 10/09 EF 55%;  echo 01/26/12: EF 55%, mild LAE, PASP 34.  Marland Kitchen Hypertension     SEVERE  . Idiopathic thrombocytopenic purpura (ITP)     DR. Jamse Arn  . Granuloma annulare   . History of phlebitis   . Lower extremity edema   . Osteoporosis   . Colitis, ischemic   . Fall     with non-healing rib fractures  . Lumbar stenosis 2004    DR. MARK ROY  . AV block, 1st degree   . Complication of anesthesia     hard to wake up  . CAD (coronary artery disease)     Non ST elevation 2005 ; LHC in 6/05 in setting of  NSTEMI: ant apical AK and inf-apical AK, EF 29%, dOM2 70-80% (small); findings c/w Tako-Tsubo CM  . Compression fracture of C-spine 10/10/2011    Fell at home, tx at [facility]  . Diarrhea associated with pseudomembranous colitis 6/11-17/2013    [facility]  . Bradycardia   . Dyslipidemia     Current Outpatient Prescriptions  Medication Sig Dispense Refill  . [place] Referral: Arville Go   Dr.William F. Linna Darner will manage patient care/orders  1.) Skilled Nursing to evaluate and monitor 2.) Physical Therapy, 2 x weekly for 4 weeks Home Health Referral: [facility]  1 each  0  . benazepril (LOTENSIN) 20 MG tablet Take 1 tablet (20 mg total) by mouth daily.  30 tablet  11  . beta carotene w/minerals (OCUVITE) tablet Take 1 tablet by mouth 2 (two) times daily.       . brimonidine (ALPHAGAN) 0.2 % ophthalmic solution Place 1 drop into the right eye 2 (two) times daily.        . carboxymethylcellulose (REFRESH TEARS) 0.5 % SOLN Place 1 drop into both eyes 2 (two) times daily.       . carvedilol (COREG) 25 MG tablet Take 25 mg by mouth 2 (two) times daily with a meal.       . Ferrous Sulfate (IRON)  325 (65 FE) MG TABS Take 1 tablet by mouth 2 (two) times daily.      . fluticasone (FLONASE) 50 MCG/ACT nasal spray USE 1 SPRAY IN EACH NOSTRIL TWICE A DAY  16 g  3  . furosemide (LASIX) 40 MG tablet Take 1 tablet (40 mg total) by mouth daily.      . Misc Natural Products (OSTEO BI-FLEX ADV JOINT SHIELD) TABS Take 2 tablets by mouth daily.        Marland Kitchen omeprazole (PRILOSEC) 20 MG capsule Take 20 mg by mouth daily.        . potassium chloride SA (K-DUR,KLOR-CON) 20 MEQ tablet TAKE 1 TABLET EVERY OTHER DAY      . spironolactone (ALDACTONE) 25 MG tablet Take 1 tablet (25 mg total) by mouth daily.  30 tablet  3  . timolol (TIMOPTIC) 0.25 % ophthalmic solution Place 1 drop into the right eye 2 (two) times daily.        . Bisacodyl (DULCOLAX PO) Take 1 tablet by mouth daily.         No current facility-administered medications for this encounter.   Facility-Administered Medications Ordered in Other Encounters  Medication Dose Route Frequency Provider Last Rate Last Dose  . cyanocobalamin ((VITAMIN B-12)) injection 1,000 mcg  1,000 mcg Intramuscular Once Nira Retort, MD   1,000 mcg at 03/15/12 1400     PHYSICAL EXAM: Filed Vitals:   03/16/12 0951  BP: 148/62  Pulse: 66   Weight change:  167  (170) General:  Elderly Well appearing. No resp difficulty HEENT: normal Neck: supple. JVP 5-6. Carotids 2+ bilaterally; no bruits. No lymphadenopathy or thryomegaly appreciated. Cor: PMI normal. Regular rate & rhythm. No rubs, gallops or murmurs. Lungs: clear Abdomen: soft, nontender, nondistended. No hepatosplenomegaly. No bruits or masses. Good bowel sounds. Extremities: no cyanosis, clubbing, rash, no R and LLE edema. Ted hose in place  Granuloma annulare lesions Neuro: alert & orientedx3, cranial nerves grossly intact. Moves all 4 extremities w/o difficulty. Affect pleasant.      ASSESSMENT & PLAN:

## 2012-03-16 NOTE — Assessment & Plan Note (Signed)
Volume status stable. Reviewed most recent BMET. Creatinine elevated from 1.3 to 2.0. BMET repeated with creatinine trending up to 2.4 and potassium 5.2. Instructed to stop spironolactone. Repeat BMET next week. Reinforced daily weights and low salt food choices. Follow up in 3 weeks.

## 2012-03-25 ENCOUNTER — Ambulatory Visit (INDEPENDENT_AMBULATORY_CARE_PROVIDER_SITE_OTHER): Payer: Medicare Other | Admitting: Internal Medicine

## 2012-03-25 ENCOUNTER — Encounter: Payer: Self-pay | Admitting: Internal Medicine

## 2012-03-25 VITALS — BP 122/74 | HR 59 | Temp 98.2°F | Wt 167.4 lb

## 2012-03-25 DIAGNOSIS — L608 Other nail disorders: Secondary | ICD-10-CM

## 2012-03-25 DIAGNOSIS — L659 Nonscarring hair loss, unspecified: Secondary | ICD-10-CM

## 2012-03-25 DIAGNOSIS — N289 Disorder of kidney and ureter, unspecified: Secondary | ICD-10-CM

## 2012-03-25 DIAGNOSIS — R5381 Other malaise: Secondary | ICD-10-CM | POA: Diagnosis not present

## 2012-03-25 DIAGNOSIS — R5383 Other fatigue: Secondary | ICD-10-CM

## 2012-03-25 DIAGNOSIS — I5032 Chronic diastolic (congestive) heart failure: Secondary | ICD-10-CM

## 2012-03-25 NOTE — Patient Instructions (Addendum)
Share mailed  results with all MDs seen

## 2012-03-25 NOTE — Addendum Note (Signed)
Addended by: Modena Morrow D on: 03/25/2012 06:19 PM   Modules accepted: Orders

## 2012-03-25 NOTE — Progress Notes (Signed)
  Subjective:    Patient ID: [name], female    DOB: 04-Feb-1923, 78 y.o.   MRN: [id number]  HPI She describes fatigue as well as hair loss and some fingernail changes in the last 2 months since she left rehabilitation.Also she stays cold  Her weight has been stable; she denies any abdominal pain. Stools are dark but she is on iron supplement from her Hematologist. She sleeps poorly, but denies apnea  She is being followed in the [facility]    Review of Systems Her renal function has varied over the last 2 months significantly. Her creatinine has been as low as 1.2; as of 9/4 it risen to the highest value at 2.44. BUN is 55, GFR 17, and potassium 5.2. Creatinine was 2.0 on 8/29. She is noticing good urine output with her present furosemide dose.  She does have some incomplete voiding. She denies hematuria, pyuria, or dysuria.     Objective:   Physical Exam Gen.:  well-nourished; in no acute distress; hair is full but thin especially over the crown Eyes: Extraocular motion intact; no lid lag or proptosis  Neck: Thyroid is small without nodularity Heart: Normal rhythm and slow rate without significant murmur, gallop, or extra heart sounds. No neck vein distention at 0 Lungs: Chest clear to auscultation ; minor rales at the bases without increased work of breathing  Abdomen: Active bowel sounds; no organomegaly, masses, or tenderness  Neuro:Deep tendon reflexes are equal and within normal limits; no tremor  Skin: Warm and dry without significant lesions or rashes; nail dehiscence since fourth right and right thumbnail Psych: Normally communicative and interactive; no abnormal mood or affect clinically.         Assessment & Plan:  #1 nail changes in hair loss; thyroid function needs to be assessed. The most likely cause of these changes is the severe prolonged illness from which she is recovering  #2 heart failure, excellent response to medications and present  regimen  #3 fatigue  #4 progressive renal insufficiency. She is not on any medications which could be contributing to this.I shall communicate with her Cardiologist as to indication for Nephrology consult.

## 2012-03-26 LAB — BASIC METABOLIC PANEL
CO2: 20 mEq/L (ref 19–32)
Chloride: 106 mEq/L (ref 96–112)
Creat: 2.39 mg/dL — ABNORMAL HIGH (ref 0.50–1.10)
Potassium: 4.6 mEq/L (ref 3.5–5.3)

## 2012-04-07 ENCOUNTER — Ambulatory Visit (HOSPITAL_COMMUNITY)
Admission: RE | Admit: 2012-04-07 | Discharge: 2012-04-07 | Disposition: A | Discharge status: Home / Self Care | Payer: Medicare Other | Source: Ambulatory Visit | Attending: Internal Medicine | Admitting: Internal Medicine

## 2012-04-07 VITALS — BP 126/68 | HR 67 | Wt 171.5 lb

## 2012-04-07 DIAGNOSIS — I509 Heart failure, unspecified: Secondary | ICD-10-CM | POA: Diagnosis not present

## 2012-04-07 DIAGNOSIS — N179 Acute kidney failure, unspecified: Secondary | ICD-10-CM | POA: Diagnosis not present

## 2012-04-07 DIAGNOSIS — Z72820 Sleep deprivation: Secondary | ICD-10-CM | POA: Diagnosis not present

## 2012-04-07 DIAGNOSIS — I5032 Chronic diastolic (congestive) heart failure: Secondary | ICD-10-CM

## 2012-04-07 DIAGNOSIS — Z7689 Persons encountering health services in other specified circumstances: Secondary | ICD-10-CM

## 2012-04-07 LAB — BASIC METABOLIC PANEL
CO2: 23 mEq/L (ref 19–32)
Calcium: 9.3 mg/dL (ref 8.4–10.5)
Chloride: 109 mEq/L (ref 96–112)
Glucose, Bld: 94 mg/dL (ref 70–99)
Sodium: 141 mEq/L (ref 135–145)

## 2012-04-07 NOTE — Progress Notes (Addendum)
HPI: [name] is a 78 y.o. 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 LHC in 6/05 in setting of NSTEMI: ant apical AK and inf-apical AK, EF 29%, dOM2 70-80% (small). EF has recovered. Also has a h/o HTN, SVT and LE edema. Discharged form [place] after a fall which resulted in a cervical fracture.   In 6/13 presented to ED with tachycardia after reducing carvedilol to 12.5 mg bid. Carvedilol increased to 25 mg twice a day.   7/13 ECHO EF 55% 02/2012 Potassium 4.2 Creatinine 1.3 03/10/12 Potassium 4.7 Creatinine 2.0 04/07/12 Potassium 4.6 Creatinine 2.39  She returns for follow up.  Arlyce Harman recently stopped due to hyperkalemia, has resolved on follow up lab.  167 pounds at home.  Denies dyspnea, orthopnea, or PND.  No edema.  No dizziness.   Not sleeping well.      ROS: All systems negative except as listed in HPI, PMH and Problem List.  Past Medical History  Diagnosis Date  . 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 Resolved. --dx'd on cath 2005. EF 29% with minimal distal CAD (small OM1 70-80).  Echo 10/09 EF 55%;  echo 01/26/12: EF 55%, mild LAE, PASP 34.  Marland Kitchen Hypertension     SEVERE  . Idiopathic thrombocytopenic purpura (ITP)     DR. Jamse Arn  . Granuloma annulare   . History of phlebitis   . Lower extremity edema   . Osteoporosis   . Colitis, ischemic   . Fall     with non-healing rib fractures  . Lumbar stenosis 2004    DR. MARK ROY  . AV block, 1st degree   . Complication of anesthesia     hard to wake up  . CAD (coronary artery disease)     Non ST elevation 2005 ; LHC in 6/05 in setting of NSTEMI: ant apical AK and inf-apical AK, EF 29%, dOM2 70-80% (small); findings c/w Tako-Tsubo CM  . Compression fracture of C-spine 10/10/2011    Fell at home, tx at [facility]  . Diarrhea associated with pseudomembranous colitis 6/11-17/2013    [facility]  . Bradycardia   . Dyslipidemia     Current Outpatient Prescriptions  Medication Sig  Dispense Refill  . benazepril (LOTENSIN) 20 MG tablet Take 1 tablet (20 mg total) by mouth daily.  30 tablet  11  . beta carotene w/minerals (OCUVITE) tablet Take 1 tablet by mouth daily.       . Bisacodyl (DULCOLAX PO) Take 1 tablet by mouth daily.      . brimonidine (ALPHAGAN) 0.2 % ophthalmic solution Place 1 drop into the right eye 2 (two) times daily.        Marland Kitchen CALCIUM-VITAMIN D PO Take by mouth 2 (two) times daily.      . carboxymethylcellulose (REFRESH TEARS) 0.5 % SOLN Place 1 drop into both eyes 2 (two) times daily.       . carvedilol (COREG) 25 MG tablet Take 25 mg by mouth 2 (two) times daily with a meal.       . Ferrous Sulfate (IRON) 325 (65 FE) MG TABS Take 1 tablet by mouth 2 (two) times daily.      . fluticasone (FLONASE) 50 MCG/ACT nasal spray USE 1 SPRAY IN EACH NOSTRIL TWICE A DAY  16 g  3  . furosemide (LASIX) 40 MG tablet Take 1 tablet (40 mg total) by mouth daily.      Marland Kitchen ibuprofen (ADVIL,MOTRIN) 200 MG tablet Take 200  mg by mouth at bedtime.      . Misc Natural Products (OSTEO BI-FLEX ADV JOINT SHIELD) TABS Take 2 tablets by mouth daily.        Marland Kitchen omeprazole (PRILOSEC) 20 MG capsule Take 20 mg by mouth daily.        . potassium chloride SA (K-DUR,KLOR-CON) 20 MEQ tablet TAKE 1 TABLET EVERY OTHER DAY      . timolol (BETIMOL) 0.5 % ophthalmic solution Place 1 drop into the right eye 2 (two) times daily.      . timolol (TIMOPTIC) 0.25 % ophthalmic solution Place 1 drop into the right eye 2 (two) times daily.           PHYSICAL EXAM: Filed Vitals:   04/07/12 1403  BP: 126/68  Pulse: 67  Weight: 171 lb 8 oz (77.792 kg)  SpO2: 100%    General:  Elderly Well appearing. No resp difficulty HEENT: normal Neck: supple. JVP 5-6. Carotids 2+ bilaterally; no bruits. No lymphadenopathy or thryomegaly appreciated. Cor: PMI normal. Regular rate & rhythm. No rubs, gallops or murmurs. Lungs: clear Abdomen: soft, nontender, nondistended. No hepatosplenomegaly. No bruits or masses.  Good bowel sounds. Extremities: no cyanosis, clubbing, rash, no edema. Ted hose in place  Granuloma annulare lesions Neuro: alert & orientedx3, cranial nerves grossly intact. Moves all 4 extremities w/o difficulty. Affect pleasant.      ASSESSMENT & PLAN:

## 2012-04-07 NOTE — Patient Instructions (Addendum)
Hold potassium or lasix if weight is 166 pounds or less.  Stop Alleve.    Try tylenol 650 mg nightly.  Try melatonin 1 tab nightly for sleep.  Labs today.  Follow up 3 months

## 2012-04-11 ENCOUNTER — Other Ambulatory Visit: Payer: Self-pay | Admitting: *Deleted

## 2012-04-11 DIAGNOSIS — Z7689 Persons encountering health services in other specified circumstances: Secondary | ICD-10-CM | POA: Insufficient documentation

## 2012-04-11 DIAGNOSIS — N179 Acute kidney failure, unspecified: Secondary | ICD-10-CM | POA: Insufficient documentation

## 2012-04-11 NOTE — Assessment & Plan Note (Signed)
Patient has been waking up several hours after falling asleep.  Have asked her to try melatonin 5 mg nightly to assist with sleep.

## 2012-04-11 NOTE — Assessment & Plan Note (Signed)
Volume status looks good.  Will continue lasix 40 mg daily with instruction to hold on days weight is 166 pounds or less.  Will check labs today with recent elevated Cr.  Have instructed her to stop taking Aleve and try tylenol for pain management.

## 2012-04-11 NOTE — Assessment & Plan Note (Signed)
ARF in setting of spiro and aleve use.  [name] has been stopped and patient has been instructed to stop aleve use.  Use tylenol instead.  Will recheck labs today.

## 2012-04-12 ENCOUNTER — Ambulatory Visit: Payer: Medicare Other

## 2012-04-12 ENCOUNTER — Ambulatory Visit (HOSPITAL_BASED_OUTPATIENT_CLINIC_OR_DEPARTMENT_OTHER): Payer: Medicare Other | Admitting: Hematology and Oncology

## 2012-04-12 ENCOUNTER — Encounter: Payer: Self-pay | Admitting: Hematology and Oncology

## 2012-04-12 ENCOUNTER — Telehealth: Payer: Self-pay | Admitting: Hematology and Oncology

## 2012-04-12 ENCOUNTER — Other Ambulatory Visit (HOSPITAL_BASED_OUTPATIENT_CLINIC_OR_DEPARTMENT_OTHER): Payer: Medicare Other | Admitting: Lab

## 2012-04-12 VITALS — BP 128/65 | HR 60 | Temp 98.4°F | Resp 18 | Ht 66.0 in | Wt 170.0 lb

## 2012-04-12 DIAGNOSIS — D693 Immune thrombocytopenic purpura: Secondary | ICD-10-CM

## 2012-04-12 DIAGNOSIS — Z23 Encounter for immunization: Secondary | ICD-10-CM

## 2012-04-12 DIAGNOSIS — D539 Nutritional anemia, unspecified: Secondary | ICD-10-CM | POA: Diagnosis not present

## 2012-04-12 DIAGNOSIS — D510 Vitamin B12 deficiency anemia due to intrinsic factor deficiency: Secondary | ICD-10-CM

## 2012-04-12 DIAGNOSIS — D696 Thrombocytopenia, unspecified: Secondary | ICD-10-CM

## 2012-04-12 DIAGNOSIS — E538 Deficiency of other specified B group vitamins: Secondary | ICD-10-CM

## 2012-04-12 LAB — BASIC METABOLIC PANEL (CC13)
CO2: 23 mEq/L (ref 22–29)
Calcium: 9.3 mg/dL (ref 8.4–10.4)
Chloride: 106 mEq/L (ref 98–107)
Creatinine: 1.9 mg/dL — ABNORMAL HIGH (ref 0.6–1.1)
Glucose: 96 mg/dl (ref 70–99)

## 2012-04-12 LAB — CBC WITH DIFFERENTIAL/PLATELET
Basophils Absolute: 0 10*3/uL (ref 0.0–0.1)
Eosinophils Absolute: 0.4 10*3/uL (ref 0.0–0.5)
HCT: 29.7 % — ABNORMAL LOW (ref 34.8–46.6)
HGB: 10.2 g/dL — ABNORMAL LOW (ref 11.6–15.9)
LYMPH%: 24.7 % (ref 14.0–49.7)
MCV: 95.2 fL (ref 79.5–101.0)
MONO#: 0.4 10*3/uL (ref 0.1–0.9)
MONO%: 6.9 % (ref 0.0–14.0)
NEUT#: 3.3 10*3/uL (ref 1.5–6.5)
NEUT%: 60.2 % (ref 38.4–76.8)
Platelets: 53 10*3/uL — ABNORMAL LOW (ref 145–400)
WBC: 5.6 10*3/uL (ref 3.9–10.3)

## 2012-04-12 MED ORDER — CYANOCOBALAMIN 1000 MCG/ML IJ SOLN
1000.0000 ug | Freq: Once | INTRAMUSCULAR | Status: AC
  Administered 2012-04-12: 1000 ug via INTRAMUSCULAR

## 2012-04-12 MED ORDER — INFLUENZA VIRUS VACC SPLIT PF IM SUSP
0.5000 mL | Freq: Once | INTRAMUSCULAR | Status: AC
  Administered 2012-04-12: 0.5 mL via INTRAMUSCULAR
  Filled 2012-04-12: qty 0.5

## 2012-04-12 NOTE — Telephone Encounter (Signed)
Printed and gv pt appt.

## 2012-04-12 NOTE — Patient Instructions (Addendum)
[name]  [id number]   [facility] - AFTER VISIT SUMMARY   **RECOMMENDATIONS MADE BY THE CONSULTANT AND ANY TEST    RESULTS WILL BE SENT TO YOUR REFERRING DOCTORS.   YOUR EXAM FINDINGS, LABS AND RESULTS WERE DISCUSSED BY YOUR MD TODAY.  YOU CAN GO TO THE  WEB SITE FOR INSTRUCTIONS ON HOW TO ASSESS MY CHART FOR ADDITIONAL INFORMATION AS NEEDED.  Your Updated drug allergies are: Allergies as of 04/12/2012 - Review Complete 04/12/2012  Allergen Reaction Noted  . Penicillins  09/19/2007  . Alendronate sodium  09/19/2007  . Carvedilol  02/02/2012  . Colchicine    . Norvasc (amlodipine besylate)  02/02/2012  . Streptomycin  09/19/2007  . Morphine and related  12/22/2011    Your current list of medications are: Current Outpatient Prescriptions  Medication Sig Dispense Refill  . benazepril (LOTENSIN) 20 MG tablet Take 1 tablet (20 mg total) by mouth daily.  30 tablet  11  . beta carotene w/minerals (OCUVITE) tablet Take 1 tablet by mouth daily.       . Bisacodyl (DULCOLAX PO) Take 1 tablet by mouth daily.      . brimonidine (ALPHAGAN) 0.2 % ophthalmic solution Place 1 drop into the right eye 2 (two) times daily.        Marland Kitchen CALCIUM-VITAMIN D PO Take by mouth 2 (two) times daily.      . carboxymethylcellulose (REFRESH TEARS) 0.5 % SOLN Place 1 drop into both eyes 2 (two) times daily.       . carvedilol (COREG) 25 MG tablet Take 25 mg by mouth 2 (two) times daily with a meal.       . Ferrous Sulfate (IRON) 325 (65 FE) MG TABS Take 1 tablet by mouth 2 (two) times daily.      . fluticasone (FLONASE) 50 MCG/ACT nasal spray USE 1 SPRAY IN EACH NOSTRIL TWICE A DAY  16 g  3  . furosemide (LASIX) 40 MG tablet Take 1 tablet (40 mg total) by mouth daily.      Marland Kitchen ibuprofen (ADVIL,MOTRIN) 200 MG tablet Take 200 mg by mouth at bedtime.      . Misc Natural Products (OSTEO BI-FLEX ADV JOINT SHIELD) TABS Take 2 tablets by mouth daily.        Marland Kitchen omeprazole (PRILOSEC) 20 MG  capsule Take 20 mg by mouth daily.        . potassium chloride SA (K-DUR,KLOR-CON) 20 MEQ tablet TAKE 1 TABLET EVERY OTHER DAY      . timolol (BETIMOL) 0.5 % ophthalmic solution Place 1 drop into the right eye 2 (two) times daily.       Current Facility-Administered Medications  Medication Dose Route Frequency Provider Last Rate Last Dose  . cyanocobalamin ((VITAMIN B-12)) injection 1,000 mcg  1,000 mcg Intramuscular Once Nira Retort, MD         INSTRUCTIONS GIVEN AND DISCUSSED:  See attached schedule   SPECIAL INSTRUCTIONS/FOLLOW-UP:  See above.  I acknowledge that I have been informed and understand all the instructions given to me and received a copy.I know to contact the clinic, my physician, or go to the emergency Department if any problems should occur.   I do not have any more questions at this time, but understand that I may call the [facility] at [phone] during business hours should I have any further questions or need assistance in obtaining follow-up care.

## 2012-04-12 NOTE — Progress Notes (Signed)
CC:   Darrick Penna. Linna Darner, MD,FACP,FCCP  IDENTIFYING STATEMENT:  The patient is an 78 year old woman with ITP who presents for followup.  INTERVAL HISTORY:  Ms. Belluomini has had no issues since her last visit. Does not note any bruising or petechiae on the skin.  Also denies bleeding.  Has good energy levels.  Today's CBC notes a white cell count of 5.6, hemoglobin 10.2, hematocrit 29.7, platelets 63 (65).  MEDICATIONS:  Reviewed and updated.  PHYSICAL EXAM:  General:  The patient is a well-appearing, well- nourished woman in no distress.  Vitals:  Pulse 60, blood pressure 128/65, temperature 98.4, respirations 18, weight 170 pounds.  HEENT: Head is atraumatic, normocephalic.  Sclerae anicteric.  Mouth moist. Chest/CVS:  Unremarkable.  Abdomen:  Soft, nontender.  Bowel sounds present.  Extremities:  No edema.  Skin:  No petechiae but notes areas of previous disseminated granuloma annulare.  IMPRESSION AND PLAN:  Ms. Pogue is an 78 year old woman with ITP (idiopathic thrombocytopenic purpura).  Her platelets are trending downwards.  Even though she has no evidence of bleeding I think she requires therapy.  She is still hesitant.  Does not want prednisone due to side effects.  I agree.  The patient has had a previous fall secondary to confusion and lives at home alone so I am not inclined to treat with prednisone.  IVIG is an option. We discussed logistics and side effects of therapy.  The patient would rather wait a little.  She returns in 2 weeks' time for CBC.  If her platelets continue to trend downwards will go ahead with treatment.    ______________________________ Nira Retort, M.D. LIO/MEDQ  D:  04/12/2012  T:  04/12/2012  Job:  [id number]

## 2012-04-12 NOTE — Progress Notes (Signed)
This office note has been dictated.

## 2012-04-14 DIAGNOSIS — IMO0002 Reserved for concepts with insufficient information to code with codable children: Secondary | ICD-10-CM | POA: Diagnosis not present

## 2012-04-26 ENCOUNTER — Other Ambulatory Visit (HOSPITAL_BASED_OUTPATIENT_CLINIC_OR_DEPARTMENT_OTHER): Payer: Medicare Other | Admitting: Lab

## 2012-04-26 DIAGNOSIS — D693 Immune thrombocytopenic purpura: Secondary | ICD-10-CM

## 2012-04-26 DIAGNOSIS — D510 Vitamin B12 deficiency anemia due to intrinsic factor deficiency: Secondary | ICD-10-CM

## 2012-04-26 LAB — CBC WITH DIFFERENTIAL/PLATELET
BASO%: 0.6 % (ref 0.0–2.0)
Basophils Absolute: 0 10*3/uL (ref 0.0–0.1)
EOS%: 7.3 % — ABNORMAL HIGH (ref 0.0–7.0)
MCH: 32.9 pg (ref 25.1–34.0)
MCHC: 34.6 g/dL (ref 31.5–36.0)
MCV: 95 fL (ref 79.5–101.0)
MONO%: 6.4 % (ref 0.0–14.0)
NEUT%: 58.2 % (ref 38.4–76.8)
RDW: 14.1 % (ref 11.2–14.5)
lymph#: 1.6 10*3/uL (ref 0.9–3.3)

## 2012-04-28 ENCOUNTER — Telehealth: Payer: Self-pay | Admitting: *Deleted

## 2012-04-28 ENCOUNTER — Other Ambulatory Visit: Payer: Self-pay | Admitting: *Deleted

## 2012-04-28 NOTE — Telephone Encounter (Signed)
Spoke with pt and informed pt re:  Platelets are a little better as per md's instructions.   Gave pt appt date and time for lab rechecked on 05/16/12 at 115 pm prior to B12 injection.   Pt voiced understanding.

## 2012-05-13 DIAGNOSIS — S3282XA Multiple fractures of pelvis without disruption of pelvic ring, initial encounter for closed fracture: Secondary | ICD-10-CM

## 2012-05-13 HISTORY — DX: Multiple fractures of pelvis without disruption of pelvic ring, initial encounter for closed fracture: S32.82XA

## 2012-05-16 ENCOUNTER — Other Ambulatory Visit (HOSPITAL_BASED_OUTPATIENT_CLINIC_OR_DEPARTMENT_OTHER): Payer: Medicare Other | Admitting: Lab

## 2012-05-16 ENCOUNTER — Ambulatory Visit (HOSPITAL_BASED_OUTPATIENT_CLINIC_OR_DEPARTMENT_OTHER): Payer: Medicare Other

## 2012-05-16 VITALS — BP 135/68 | HR 67 | Temp 97.3°F

## 2012-05-16 DIAGNOSIS — D510 Vitamin B12 deficiency anemia due to intrinsic factor deficiency: Secondary | ICD-10-CM

## 2012-05-16 DIAGNOSIS — D693 Immune thrombocytopenic purpura: Secondary | ICD-10-CM

## 2012-05-16 LAB — CBC WITH DIFFERENTIAL/PLATELET
Basophils Absolute: 0 10*3/uL (ref 0.0–0.1)
EOS%: 5.7 % (ref 0.0–7.0)
HGB: 10.9 g/dL — ABNORMAL LOW (ref 11.6–15.9)
MCH: 33.2 pg (ref 25.1–34.0)
MCV: 95.9 fL (ref 79.5–101.0)
MONO%: 8.8 % (ref 0.0–14.0)
NEUT#: 3 10*3/uL (ref 1.5–6.5)
RBC: 3.28 10*6/uL — ABNORMAL LOW (ref 3.70–5.45)
RDW: 13.8 % (ref 11.2–14.5)
lymph#: 1.7 10*3/uL (ref 0.9–3.3)

## 2012-05-16 MED ORDER — CYANOCOBALAMIN 1000 MCG/ML IJ SOLN
1000.0000 ug | Freq: Once | INTRAMUSCULAR | Status: AC
  Administered 2012-05-16: 1000 ug via INTRAMUSCULAR

## 2012-05-18 DIAGNOSIS — S322XXA Fracture of coccyx, initial encounter for closed fracture: Secondary | ICD-10-CM | POA: Diagnosis present

## 2012-05-18 DIAGNOSIS — M1710 Unilateral primary osteoarthritis, unspecified knee: Secondary | ICD-10-CM | POA: Diagnosis not present

## 2012-05-18 DIAGNOSIS — IMO0002 Reserved for concepts with insufficient information to code with codable children: Secondary | ICD-10-CM | POA: Diagnosis not present

## 2012-05-18 DIAGNOSIS — L538 Other specified erythematous conditions: Secondary | ICD-10-CM | POA: Diagnosis not present

## 2012-05-18 DIAGNOSIS — R6889 Other general symptoms and signs: Secondary | ICD-10-CM | POA: Diagnosis not present

## 2012-05-18 DIAGNOSIS — R000 Tachycardia, unspecified: Secondary | ICD-10-CM | POA: Diagnosis present

## 2012-05-18 DIAGNOSIS — S3210XA Unspecified fracture of sacrum, initial encounter for closed fracture: Secondary | ICD-10-CM | POA: Diagnosis not present

## 2012-05-18 DIAGNOSIS — I2510 Atherosclerotic heart disease of native coronary artery without angina pectoris: Secondary | ICD-10-CM | POA: Diagnosis present

## 2012-05-18 DIAGNOSIS — Z9181 History of falling: Secondary | ICD-10-CM | POA: Diagnosis not present

## 2012-05-18 DIAGNOSIS — M25559 Pain in unspecified hip: Secondary | ICD-10-CM | POA: Diagnosis not present

## 2012-05-18 DIAGNOSIS — I959 Hypotension, unspecified: Secondary | ICD-10-CM | POA: Diagnosis not present

## 2012-05-18 DIAGNOSIS — J9819 Other pulmonary collapse: Secondary | ICD-10-CM | POA: Diagnosis not present

## 2012-05-18 DIAGNOSIS — N179 Acute kidney failure, unspecified: Secondary | ICD-10-CM | POA: Diagnosis not present

## 2012-05-18 DIAGNOSIS — Z66 Do not resuscitate: Secondary | ICD-10-CM | POA: Diagnosis present

## 2012-05-18 DIAGNOSIS — K5900 Constipation, unspecified: Secondary | ICD-10-CM | POA: Diagnosis not present

## 2012-05-18 DIAGNOSIS — Z5189 Encounter for other specified aftercare: Secondary | ICD-10-CM | POA: Diagnosis not present

## 2012-05-18 DIAGNOSIS — Z9889 Other specified postprocedural states: Secondary | ICD-10-CM | POA: Diagnosis not present

## 2012-05-18 DIAGNOSIS — S32509A Unspecified fracture of unspecified pubis, initial encounter for closed fracture: Secondary | ICD-10-CM | POA: Diagnosis present

## 2012-05-18 DIAGNOSIS — Z90710 Acquired absence of both cervix and uterus: Secondary | ICD-10-CM | POA: Diagnosis not present

## 2012-05-18 DIAGNOSIS — I10 Essential (primary) hypertension: Secondary | ICD-10-CM | POA: Diagnosis not present

## 2012-05-18 DIAGNOSIS — M25569 Pain in unspecified knee: Secondary | ICD-10-CM | POA: Diagnosis not present

## 2012-05-18 DIAGNOSIS — H3530 Unspecified macular degeneration: Secondary | ICD-10-CM | POA: Diagnosis not present

## 2012-05-18 DIAGNOSIS — M1990 Unspecified osteoarthritis, unspecified site: Secondary | ICD-10-CM | POA: Diagnosis not present

## 2012-05-18 DIAGNOSIS — Z947 Corneal transplant status: Secondary | ICD-10-CM | POA: Diagnosis not present

## 2012-05-18 DIAGNOSIS — S329XXA Fracture of unspecified parts of lumbosacral spine and pelvis, initial encounter for closed fracture: Secondary | ICD-10-CM | POA: Diagnosis not present

## 2012-05-18 DIAGNOSIS — I471 Supraventricular tachycardia: Secondary | ICD-10-CM | POA: Diagnosis not present

## 2012-05-18 DIAGNOSIS — Z0489 Encounter for examination and observation for other specified reasons: Secondary | ICD-10-CM | POA: Diagnosis not present

## 2012-05-18 DIAGNOSIS — Z9089 Acquired absence of other organs: Secondary | ICD-10-CM | POA: Diagnosis not present

## 2012-05-18 DIAGNOSIS — R52 Pain, unspecified: Secondary | ICD-10-CM | POA: Diagnosis not present

## 2012-05-18 DIAGNOSIS — Z043 Encounter for examination and observation following other accident: Secondary | ICD-10-CM | POA: Diagnosis not present

## 2012-05-18 DIAGNOSIS — I252 Old myocardial infarction: Secondary | ICD-10-CM | POA: Diagnosis not present

## 2012-05-19 DIAGNOSIS — M1710 Unilateral primary osteoarthritis, unspecified knee: Secondary | ICD-10-CM | POA: Diagnosis not present

## 2012-05-19 DIAGNOSIS — R000 Tachycardia, unspecified: Secondary | ICD-10-CM | POA: Diagnosis not present

## 2012-05-19 DIAGNOSIS — Z0489 Encounter for examination and observation for other specified reasons: Secondary | ICD-10-CM | POA: Diagnosis not present

## 2012-05-19 DIAGNOSIS — M25569 Pain in unspecified knee: Secondary | ICD-10-CM | POA: Diagnosis not present

## 2012-05-19 DIAGNOSIS — Z043 Encounter for examination and observation following other accident: Secondary | ICD-10-CM | POA: Diagnosis not present

## 2012-05-19 DIAGNOSIS — S322XXA Fracture of coccyx, initial encounter for closed fracture: Secondary | ICD-10-CM | POA: Diagnosis not present

## 2012-05-19 DIAGNOSIS — S32509A Unspecified fracture of unspecified pubis, initial encounter for closed fracture: Secondary | ICD-10-CM | POA: Diagnosis not present

## 2012-05-19 DIAGNOSIS — S329XXA Fracture of unspecified parts of lumbosacral spine and pelvis, initial encounter for closed fracture: Secondary | ICD-10-CM | POA: Diagnosis not present

## 2012-05-20 DIAGNOSIS — I10 Essential (primary) hypertension: Secondary | ICD-10-CM | POA: Diagnosis not present

## 2012-05-20 DIAGNOSIS — N179 Acute kidney failure, unspecified: Secondary | ICD-10-CM | POA: Diagnosis not present

## 2012-05-20 DIAGNOSIS — S32509A Unspecified fracture of unspecified pubis, initial encounter for closed fracture: Secondary | ICD-10-CM | POA: Diagnosis not present

## 2012-05-21 DIAGNOSIS — S32509A Unspecified fracture of unspecified pubis, initial encounter for closed fracture: Secondary | ICD-10-CM | POA: Diagnosis not present

## 2012-05-21 DIAGNOSIS — I10 Essential (primary) hypertension: Secondary | ICD-10-CM | POA: Diagnosis not present

## 2012-05-21 DIAGNOSIS — N179 Acute kidney failure, unspecified: Secondary | ICD-10-CM | POA: Diagnosis not present

## 2012-05-22 DIAGNOSIS — I10 Essential (primary) hypertension: Secondary | ICD-10-CM | POA: Diagnosis not present

## 2012-05-22 DIAGNOSIS — N179 Acute kidney failure, unspecified: Secondary | ICD-10-CM | POA: Diagnosis not present

## 2012-05-22 DIAGNOSIS — S32509A Unspecified fracture of unspecified pubis, initial encounter for closed fracture: Secondary | ICD-10-CM | POA: Diagnosis not present

## 2012-05-23 DIAGNOSIS — Z947 Corneal transplant status: Secondary | ICD-10-CM | POA: Diagnosis not present

## 2012-05-23 DIAGNOSIS — M169 Osteoarthritis of hip, unspecified: Secondary | ICD-10-CM | POA: Diagnosis not present

## 2012-05-23 DIAGNOSIS — S329XXA Fracture of unspecified parts of lumbosacral spine and pelvis, initial encounter for closed fracture: Secondary | ICD-10-CM | POA: Diagnosis not present

## 2012-05-23 DIAGNOSIS — M5137 Other intervertebral disc degeneration, lumbosacral region: Secondary | ICD-10-CM | POA: Diagnosis not present

## 2012-05-23 DIAGNOSIS — H3530 Unspecified macular degeneration: Secondary | ICD-10-CM | POA: Diagnosis not present

## 2012-05-23 DIAGNOSIS — M1990 Unspecified osteoarthritis, unspecified site: Secondary | ICD-10-CM | POA: Diagnosis not present

## 2012-05-23 DIAGNOSIS — M62838 Other muscle spasm: Secondary | ICD-10-CM | POA: Diagnosis not present

## 2012-05-23 DIAGNOSIS — Z5189 Encounter for other specified aftercare: Secondary | ICD-10-CM | POA: Diagnosis not present

## 2012-05-23 DIAGNOSIS — S72009D Fracture of unspecified part of neck of unspecified femur, subsequent encounter for closed fracture with routine healing: Secondary | ICD-10-CM | POA: Diagnosis not present

## 2012-05-23 DIAGNOSIS — R279 Unspecified lack of coordination: Secondary | ICD-10-CM | POA: Diagnosis not present

## 2012-05-23 DIAGNOSIS — K5900 Constipation, unspecified: Secondary | ICD-10-CM | POA: Diagnosis not present

## 2012-05-23 DIAGNOSIS — I10 Essential (primary) hypertension: Secondary | ICD-10-CM | POA: Diagnosis not present

## 2012-05-23 DIAGNOSIS — S3210XA Unspecified fracture of sacrum, initial encounter for closed fracture: Secondary | ICD-10-CM | POA: Diagnosis not present

## 2012-05-23 DIAGNOSIS — Z9181 History of falling: Secondary | ICD-10-CM | POA: Diagnosis not present

## 2012-05-23 DIAGNOSIS — IMO0001 Reserved for inherently not codable concepts without codable children: Secondary | ICD-10-CM | POA: Diagnosis not present

## 2012-05-23 DIAGNOSIS — IMO0002 Reserved for concepts with insufficient information to code with codable children: Secondary | ICD-10-CM | POA: Diagnosis not present

## 2012-05-23 DIAGNOSIS — N179 Acute kidney failure, unspecified: Secondary | ICD-10-CM | POA: Diagnosis not present

## 2012-05-23 DIAGNOSIS — S32509A Unspecified fracture of unspecified pubis, initial encounter for closed fracture: Secondary | ICD-10-CM | POA: Diagnosis not present

## 2012-05-23 DIAGNOSIS — M810 Age-related osteoporosis without current pathological fracture: Secondary | ICD-10-CM | POA: Diagnosis not present

## 2012-05-23 DIAGNOSIS — I252 Old myocardial infarction: Secondary | ICD-10-CM | POA: Diagnosis not present

## 2012-05-23 DIAGNOSIS — L538 Other specified erythematous conditions: Secondary | ICD-10-CM | POA: Diagnosis not present

## 2012-05-23 DIAGNOSIS — R262 Difficulty in walking, not elsewhere classified: Secondary | ICD-10-CM | POA: Diagnosis not present

## 2012-05-23 DIAGNOSIS — I2510 Atherosclerotic heart disease of native coronary artery without angina pectoris: Secondary | ICD-10-CM | POA: Diagnosis not present

## 2012-05-23 DIAGNOSIS — M6281 Muscle weakness (generalized): Secondary | ICD-10-CM | POA: Diagnosis not present

## 2012-05-23 DIAGNOSIS — R609 Edema, unspecified: Secondary | ICD-10-CM | POA: Diagnosis not present

## 2012-05-23 DIAGNOSIS — R6889 Other general symptoms and signs: Secondary | ICD-10-CM | POA: Diagnosis not present

## 2012-05-23 DIAGNOSIS — D62 Acute posthemorrhagic anemia: Secondary | ICD-10-CM | POA: Diagnosis not present

## 2012-05-23 DIAGNOSIS — S329XXB Fracture of unspecified parts of lumbosacral spine and pelvis, initial encounter for open fracture: Secondary | ICD-10-CM | POA: Diagnosis not present

## 2012-05-25 DIAGNOSIS — D62 Acute posthemorrhagic anemia: Secondary | ICD-10-CM | POA: Diagnosis not present

## 2012-05-25 DIAGNOSIS — I10 Essential (primary) hypertension: Secondary | ICD-10-CM | POA: Diagnosis not present

## 2012-05-25 DIAGNOSIS — I2510 Atherosclerotic heart disease of native coronary artery without angina pectoris: Secondary | ICD-10-CM | POA: Diagnosis not present

## 2012-05-25 DIAGNOSIS — S329XXB Fracture of unspecified parts of lumbosacral spine and pelvis, initial encounter for open fracture: Secondary | ICD-10-CM | POA: Diagnosis not present

## 2012-06-07 ENCOUNTER — Other Ambulatory Visit: Payer: Self-pay | Admitting: Nurse Practitioner

## 2012-06-07 DIAGNOSIS — M62838 Other muscle spasm: Secondary | ICD-10-CM | POA: Diagnosis not present

## 2012-06-07 DIAGNOSIS — I10 Essential (primary) hypertension: Secondary | ICD-10-CM | POA: Diagnosis not present

## 2012-06-07 DIAGNOSIS — D62 Acute posthemorrhagic anemia: Secondary | ICD-10-CM | POA: Diagnosis not present

## 2012-06-08 DIAGNOSIS — M810 Age-related osteoporosis without current pathological fracture: Secondary | ICD-10-CM | POA: Diagnosis not present

## 2012-06-10 ENCOUNTER — Telehealth: Payer: Self-pay | Admitting: Hematology and Oncology

## 2012-06-10 ENCOUNTER — Telehealth: Payer: Self-pay | Admitting: *Deleted

## 2012-06-10 NOTE — Telephone Encounter (Signed)
Received call from daughter in-law [name] wanting to let md know re:  Pt will not be able to keep f/u appts on 06/14/12 - due to pt fell 2 1/2 weeks ago and injured her pelvis.   Pt was at [facility] for several days and is now at [facility] for rehab. [name] stated pt can bear weight minimally.  Informed [name] that when pt is discharged from rehab center and stable, [name] can call nurse back for a rescheduled appt to be made for pt.   [name] voiced understanding. [name]'s  Phone    [phone].

## 2012-06-10 NOTE — Telephone Encounter (Signed)
Moved 12/3 f/u from [facility] to [facility]. lmonvm for pt re change w/new time for 12/3 @ 2pm.

## 2012-06-14 ENCOUNTER — Telehealth: Payer: Self-pay | Admitting: *Deleted

## 2012-06-14 ENCOUNTER — Ambulatory Visit: Payer: Medicare Other

## 2012-06-14 ENCOUNTER — Ambulatory Visit: Payer: Medicare Other | Admitting: Hematology and Oncology

## 2012-06-14 ENCOUNTER — Other Ambulatory Visit: Payer: Medicare Other | Admitting: Lab

## 2012-06-14 DIAGNOSIS — S72009D Fracture of unspecified part of neck of unspecified femur, subsequent encounter for closed fracture with routine healing: Secondary | ICD-10-CM | POA: Diagnosis not present

## 2012-06-14 NOTE — Telephone Encounter (Signed)
Spoke with pt's nurse at [facility] and informed her of pt needing Vit B12 injection while in rehab.   Faxed signed order for B12  1000 mcg IM x 1 dose to [facility] as ok per md. [facility]  Phone    [phone]  ;   Fax    [phone].

## 2012-06-16 DIAGNOSIS — I10 Essential (primary) hypertension: Secondary | ICD-10-CM | POA: Diagnosis not present

## 2012-06-16 DIAGNOSIS — M62838 Other muscle spasm: Secondary | ICD-10-CM | POA: Diagnosis not present

## 2012-06-16 DIAGNOSIS — S329XXB Fracture of unspecified parts of lumbosacral spine and pelvis, initial encounter for open fracture: Secondary | ICD-10-CM | POA: Diagnosis not present

## 2012-06-16 DIAGNOSIS — M810 Age-related osteoporosis without current pathological fracture: Secondary | ICD-10-CM | POA: Diagnosis not present

## 2012-06-16 DIAGNOSIS — D62 Acute posthemorrhagic anemia: Secondary | ICD-10-CM | POA: Diagnosis not present

## 2012-06-16 DIAGNOSIS — R609 Edema, unspecified: Secondary | ICD-10-CM | POA: Diagnosis not present

## 2012-06-25 DIAGNOSIS — Z602 Problems related to living alone: Secondary | ICD-10-CM | POA: Diagnosis not present

## 2012-06-25 DIAGNOSIS — IMO0001 Reserved for inherently not codable concepts without codable children: Secondary | ICD-10-CM | POA: Diagnosis not present

## 2012-06-25 DIAGNOSIS — I10 Essential (primary) hypertension: Secondary | ICD-10-CM | POA: Diagnosis not present

## 2012-06-25 DIAGNOSIS — M6281 Muscle weakness (generalized): Secondary | ICD-10-CM | POA: Diagnosis not present

## 2012-06-25 DIAGNOSIS — I2510 Atherosclerotic heart disease of native coronary artery without angina pectoris: Secondary | ICD-10-CM | POA: Diagnosis not present

## 2012-06-27 DIAGNOSIS — I2510 Atherosclerotic heart disease of native coronary artery without angina pectoris: Secondary | ICD-10-CM | POA: Diagnosis not present

## 2012-06-27 DIAGNOSIS — M6281 Muscle weakness (generalized): Secondary | ICD-10-CM | POA: Diagnosis not present

## 2012-06-27 DIAGNOSIS — IMO0001 Reserved for inherently not codable concepts without codable children: Secondary | ICD-10-CM | POA: Diagnosis not present

## 2012-06-27 DIAGNOSIS — I10 Essential (primary) hypertension: Secondary | ICD-10-CM | POA: Diagnosis not present

## 2012-06-27 DIAGNOSIS — Z602 Problems related to living alone: Secondary | ICD-10-CM | POA: Diagnosis not present

## 2012-06-28 ENCOUNTER — Telehealth: Payer: Self-pay

## 2012-06-28 ENCOUNTER — Telehealth: Payer: Self-pay | Admitting: *Deleted

## 2012-06-28 NOTE — Telephone Encounter (Signed)
[name] is alert; diagnosis is adult failure to thrive

## 2012-06-28 NOTE — Telephone Encounter (Signed)
Left message giving verbal order

## 2012-06-28 NOTE — Telephone Encounter (Signed)
Message left on voicemail, please return call with verbal order for Occupational Therapy 2 x weekly x 3 weeks  [facility] please advise, Last OV 03/25/12

## 2012-06-28 NOTE — Telephone Encounter (Signed)
Error

## 2012-06-29 DIAGNOSIS — IMO0001 Reserved for inherently not codable concepts without codable children: Secondary | ICD-10-CM | POA: Diagnosis not present

## 2012-06-29 DIAGNOSIS — M6281 Muscle weakness (generalized): Secondary | ICD-10-CM | POA: Diagnosis not present

## 2012-06-29 DIAGNOSIS — I2510 Atherosclerotic heart disease of native coronary artery without angina pectoris: Secondary | ICD-10-CM | POA: Diagnosis not present

## 2012-06-29 DIAGNOSIS — Z602 Problems related to living alone: Secondary | ICD-10-CM | POA: Diagnosis not present

## 2012-06-29 DIAGNOSIS — I10 Essential (primary) hypertension: Secondary | ICD-10-CM | POA: Diagnosis not present

## 2012-06-30 DIAGNOSIS — Z602 Problems related to living alone: Secondary | ICD-10-CM | POA: Diagnosis not present

## 2012-06-30 DIAGNOSIS — IMO0001 Reserved for inherently not codable concepts without codable children: Secondary | ICD-10-CM | POA: Diagnosis not present

## 2012-06-30 DIAGNOSIS — M6281 Muscle weakness (generalized): Secondary | ICD-10-CM | POA: Diagnosis not present

## 2012-06-30 DIAGNOSIS — I2510 Atherosclerotic heart disease of native coronary artery without angina pectoris: Secondary | ICD-10-CM | POA: Diagnosis not present

## 2012-06-30 DIAGNOSIS — I10 Essential (primary) hypertension: Secondary | ICD-10-CM | POA: Diagnosis not present

## 2012-07-02 DIAGNOSIS — M6281 Muscle weakness (generalized): Secondary | ICD-10-CM | POA: Diagnosis not present

## 2012-07-02 DIAGNOSIS — I10 Essential (primary) hypertension: Secondary | ICD-10-CM | POA: Diagnosis not present

## 2012-07-02 DIAGNOSIS — I2510 Atherosclerotic heart disease of native coronary artery without angina pectoris: Secondary | ICD-10-CM | POA: Diagnosis not present

## 2012-07-02 DIAGNOSIS — IMO0001 Reserved for inherently not codable concepts without codable children: Secondary | ICD-10-CM | POA: Diagnosis not present

## 2012-07-02 DIAGNOSIS — Z602 Problems related to living alone: Secondary | ICD-10-CM | POA: Diagnosis not present

## 2012-07-04 DIAGNOSIS — I10 Essential (primary) hypertension: Secondary | ICD-10-CM | POA: Diagnosis not present

## 2012-07-04 DIAGNOSIS — M6281 Muscle weakness (generalized): Secondary | ICD-10-CM | POA: Diagnosis not present

## 2012-07-04 DIAGNOSIS — Z602 Problems related to living alone: Secondary | ICD-10-CM | POA: Diagnosis not present

## 2012-07-04 DIAGNOSIS — I2510 Atherosclerotic heart disease of native coronary artery without angina pectoris: Secondary | ICD-10-CM | POA: Diagnosis not present

## 2012-07-04 DIAGNOSIS — IMO0001 Reserved for inherently not codable concepts without codable children: Secondary | ICD-10-CM | POA: Diagnosis not present

## 2012-07-05 DIAGNOSIS — IMO0001 Reserved for inherently not codable concepts without codable children: Secondary | ICD-10-CM | POA: Diagnosis not present

## 2012-07-05 DIAGNOSIS — I10 Essential (primary) hypertension: Secondary | ICD-10-CM | POA: Diagnosis not present

## 2012-07-05 DIAGNOSIS — M6281 Muscle weakness (generalized): Secondary | ICD-10-CM | POA: Diagnosis not present

## 2012-07-05 DIAGNOSIS — I2510 Atherosclerotic heart disease of native coronary artery without angina pectoris: Secondary | ICD-10-CM | POA: Diagnosis not present

## 2012-07-05 DIAGNOSIS — Z602 Problems related to living alone: Secondary | ICD-10-CM | POA: Diagnosis not present

## 2012-07-07 ENCOUNTER — Encounter (HOSPITAL_COMMUNITY): Payer: Medicare Other

## 2012-07-07 DIAGNOSIS — Z602 Problems related to living alone: Secondary | ICD-10-CM | POA: Diagnosis not present

## 2012-07-07 DIAGNOSIS — I10 Essential (primary) hypertension: Secondary | ICD-10-CM | POA: Diagnosis not present

## 2012-07-07 DIAGNOSIS — I2510 Atherosclerotic heart disease of native coronary artery without angina pectoris: Secondary | ICD-10-CM | POA: Diagnosis not present

## 2012-07-07 DIAGNOSIS — M6281 Muscle weakness (generalized): Secondary | ICD-10-CM | POA: Diagnosis not present

## 2012-07-07 DIAGNOSIS — IMO0001 Reserved for inherently not codable concepts without codable children: Secondary | ICD-10-CM | POA: Diagnosis not present

## 2012-07-08 ENCOUNTER — Telehealth: Payer: Self-pay | Admitting: Internal Medicine

## 2012-07-08 DIAGNOSIS — Z602 Problems related to living alone: Secondary | ICD-10-CM | POA: Diagnosis not present

## 2012-07-08 DIAGNOSIS — I2510 Atherosclerotic heart disease of native coronary artery without angina pectoris: Secondary | ICD-10-CM | POA: Diagnosis not present

## 2012-07-08 DIAGNOSIS — M6281 Muscle weakness (generalized): Secondary | ICD-10-CM | POA: Diagnosis not present

## 2012-07-08 DIAGNOSIS — I10 Essential (primary) hypertension: Secondary | ICD-10-CM | POA: Diagnosis not present

## 2012-07-08 DIAGNOSIS — IMO0001 Reserved for inherently not codable concepts without codable children: Secondary | ICD-10-CM | POA: Diagnosis not present

## 2012-07-08 NOTE — Telephone Encounter (Signed)
Having sciatic nerve issues and needs advice,pls call

## 2012-07-08 NOTE — Telephone Encounter (Signed)
lmovm for pt to return call.  

## 2012-07-11 DIAGNOSIS — I2510 Atherosclerotic heart disease of native coronary artery without angina pectoris: Secondary | ICD-10-CM | POA: Diagnosis not present

## 2012-07-11 DIAGNOSIS — I10 Essential (primary) hypertension: Secondary | ICD-10-CM | POA: Diagnosis not present

## 2012-07-11 DIAGNOSIS — M6281 Muscle weakness (generalized): Secondary | ICD-10-CM | POA: Diagnosis not present

## 2012-07-11 DIAGNOSIS — IMO0001 Reserved for inherently not codable concepts without codable children: Secondary | ICD-10-CM | POA: Diagnosis not present

## 2012-07-11 DIAGNOSIS — Z602 Problems related to living alone: Secondary | ICD-10-CM | POA: Diagnosis not present

## 2012-07-11 NOTE — Telephone Encounter (Signed)
Not able to call in med w/out seeing pt- I believe Dr Linna Darner returns tomorrow if pt needs appt

## 2012-07-11 NOTE — Telephone Encounter (Signed)
Discussed with pt. See phone note.

## 2012-07-11 NOTE — Telephone Encounter (Signed)
Pt states she has having pain in right hip & down into leg, she thinks it is due to a sciatic nerve. Pt states that the pain has been going on for a week & the pt would like something call into pharmacy. I advised pt she may need to be seen & the pt states she did not want to be seen by anyone other than Dr. Linna Darner & she really just wants something for the pain called in the pharmacy. Please advise.

## 2012-07-12 ENCOUNTER — Ambulatory Visit (HOSPITAL_BASED_OUTPATIENT_CLINIC_OR_DEPARTMENT_OTHER)
Admission: RE | Admit: 2012-07-12 | Discharge: 2012-07-12 | Disposition: A | Discharge status: Home / Self Care | Payer: Medicare Other | Source: Ambulatory Visit | Attending: Internal Medicine | Admitting: Internal Medicine

## 2012-07-12 ENCOUNTER — Encounter: Payer: Self-pay | Admitting: Internal Medicine

## 2012-07-12 ENCOUNTER — Ambulatory Visit (INDEPENDENT_AMBULATORY_CARE_PROVIDER_SITE_OTHER): Payer: Medicare Other | Admitting: Internal Medicine

## 2012-07-12 VITALS — BP 124/86 | HR 96 | Temp 97.7°F | Wt 169.8 lb

## 2012-07-12 DIAGNOSIS — M5416 Radiculopathy, lumbar region: Secondary | ICD-10-CM

## 2012-07-12 DIAGNOSIS — M810 Age-related osteoporosis without current pathological fracture: Secondary | ICD-10-CM

## 2012-07-12 DIAGNOSIS — IMO0002 Reserved for concepts with insufficient information to code with codable children: Secondary | ICD-10-CM | POA: Insufficient documentation

## 2012-07-12 DIAGNOSIS — M47817 Spondylosis without myelopathy or radiculopathy, lumbosacral region: Secondary | ICD-10-CM | POA: Diagnosis not present

## 2012-07-12 MED ORDER — FENTANYL 25 MCG/HR TD PT72: 1.0000 | MEDICATED_PATCH | TRANSDERMAL | Status: DC

## 2012-07-12 NOTE — Patient Instructions (Addendum)
Order for x-rays entered into  the computer; these will be performed at [facility]. No appointment is necessary.   If you activate My Chart; the results can be released to you as soon as they populate from the lab. If you choose not to use this program; the labs have to be reviewed, copied & mailed causing a delay in getting the results to you.      [address] [place]  For 06/25/12-08/23/12  reviewed & completed. Discrepancies noted between EMR Med List & meds listed on form . Handwritten notes made on form  & request made for clarification . NSAIDS & Benadryl medication are  " as needed" and should not be taken on a regular basis. These should be taken as little as possible because Benadryl  may affect level of alertness and balance with increased risk of falling and NSAIDS increase GI & cardiac risks. Fentanyl patch added to list

## 2012-07-12 NOTE — Progress Notes (Signed)
  Subjective:    Patient ID: [name], female    DOB: Dec 15, 1922, 78 y.o.   MRN: [id number]  HPI HIP PAIN: Location: R iliosacral area  Quality: ?  Onset:  December 20,2013 Worse with: walking  Better with:Advil helps; Tylenol does not   Radiation:lateral leg  to R ankle  Trauma: pelvic fracture 05/18/12, Dr Corena Herter ,[facility]    PMH of osteoporosis with vertebroplasty Previously on dapsone until 4/13      Review of Systems Fecal/urinary incontinence: no Numbness/Weakness:no Fever/chills/sweats: no Night pain: yes Unexplained weight loss:no     Objective:   Physical Exam Gen.: adequately nourished in appearance. Alert, appropriate and cooperative throughout exam.                                                                                 Musculoskeletal/extremities: Accentuated curvature of upper thoracic  spine. . No clubbing, cyanosis,  deformity noted. Range of motion  Decreased in lower extremities. Tone & strength decreased in legs.Joints normal. Nail health  Good. Trace edema. She is able to lie flat and sit up with moderate help. Straight leg raising is negative bilaterally. Pain to light percussion over the right iliosacral joints Vascular:  dorsalis pedis and  posterior tibial pulses are full and equal.  Neurologic: Alert and oriented x3. Deep tendon reflexes symmetrical and normal.  Gait is slow and methodical with short steps        Skin: scattered granuloma annulare rash. Lymph: No cervical, axillary lymphadenopathy present. Psych: Mood and affect are normal. Normally interactive                                                                                         Assessment & Plan:  #1 low back pain with possible L4-5 radicular pain. Distribution of discomfort is not suggest sciatica  #2 recent history of pelvic fracture  #3 Oxycodone intolerance  Plan: See orders and recommendations

## 2012-07-14 ENCOUNTER — Encounter: Payer: Self-pay | Admitting: *Deleted

## 2012-07-14 ENCOUNTER — Ambulatory Visit: Payer: Medicare Other | Admitting: Internal Medicine

## 2012-07-14 DIAGNOSIS — M6281 Muscle weakness (generalized): Secondary | ICD-10-CM | POA: Diagnosis not present

## 2012-07-14 DIAGNOSIS — S7290XD Unspecified fracture of unspecified femur, subsequent encounter for closed fracture with routine healing: Secondary | ICD-10-CM | POA: Diagnosis not present

## 2012-07-14 DIAGNOSIS — Z23 Encounter for immunization: Secondary | ICD-10-CM

## 2012-07-14 DIAGNOSIS — IMO0001 Reserved for inherently not codable concepts without codable children: Secondary | ICD-10-CM | POA: Diagnosis not present

## 2012-07-14 DIAGNOSIS — I10 Essential (primary) hypertension: Secondary | ICD-10-CM | POA: Diagnosis not present

## 2012-07-14 DIAGNOSIS — I2510 Atherosclerotic heart disease of native coronary artery without angina pectoris: Secondary | ICD-10-CM | POA: Diagnosis not present

## 2012-07-14 DIAGNOSIS — Z602 Problems related to living alone: Secondary | ICD-10-CM | POA: Diagnosis not present

## 2012-07-19 DIAGNOSIS — IMO0001 Reserved for inherently not codable concepts without codable children: Secondary | ICD-10-CM | POA: Diagnosis not present

## 2012-07-19 DIAGNOSIS — I10 Essential (primary) hypertension: Secondary | ICD-10-CM | POA: Diagnosis not present

## 2012-07-19 DIAGNOSIS — M6281 Muscle weakness (generalized): Secondary | ICD-10-CM | POA: Diagnosis not present

## 2012-07-19 DIAGNOSIS — Z602 Problems related to living alone: Secondary | ICD-10-CM | POA: Diagnosis not present

## 2012-07-19 DIAGNOSIS — I2510 Atherosclerotic heart disease of native coronary artery without angina pectoris: Secondary | ICD-10-CM | POA: Diagnosis not present

## 2012-07-21 DIAGNOSIS — I2510 Atherosclerotic heart disease of native coronary artery without angina pectoris: Secondary | ICD-10-CM | POA: Diagnosis not present

## 2012-07-21 DIAGNOSIS — Z602 Problems related to living alone: Secondary | ICD-10-CM | POA: Diagnosis not present

## 2012-07-21 DIAGNOSIS — I10 Essential (primary) hypertension: Secondary | ICD-10-CM | POA: Diagnosis not present

## 2012-07-21 DIAGNOSIS — IMO0001 Reserved for inherently not codable concepts without codable children: Secondary | ICD-10-CM | POA: Diagnosis not present

## 2012-07-21 DIAGNOSIS — M6281 Muscle weakness (generalized): Secondary | ICD-10-CM | POA: Diagnosis not present

## 2012-07-22 DIAGNOSIS — I2510 Atherosclerotic heart disease of native coronary artery without angina pectoris: Secondary | ICD-10-CM | POA: Diagnosis not present

## 2012-07-22 DIAGNOSIS — I10 Essential (primary) hypertension: Secondary | ICD-10-CM | POA: Diagnosis not present

## 2012-07-22 DIAGNOSIS — M6281 Muscle weakness (generalized): Secondary | ICD-10-CM | POA: Diagnosis not present

## 2012-07-22 DIAGNOSIS — Z602 Problems related to living alone: Secondary | ICD-10-CM | POA: Diagnosis not present

## 2012-07-22 DIAGNOSIS — IMO0001 Reserved for inherently not codable concepts without codable children: Secondary | ICD-10-CM | POA: Diagnosis not present

## 2012-07-26 DIAGNOSIS — Z602 Problems related to living alone: Secondary | ICD-10-CM | POA: Diagnosis not present

## 2012-07-26 DIAGNOSIS — I10 Essential (primary) hypertension: Secondary | ICD-10-CM | POA: Diagnosis not present

## 2012-07-26 DIAGNOSIS — M6281 Muscle weakness (generalized): Secondary | ICD-10-CM | POA: Diagnosis not present

## 2012-07-26 DIAGNOSIS — IMO0001 Reserved for inherently not codable concepts without codable children: Secondary | ICD-10-CM | POA: Diagnosis not present

## 2012-07-26 DIAGNOSIS — I2510 Atherosclerotic heart disease of native coronary artery without angina pectoris: Secondary | ICD-10-CM | POA: Diagnosis not present

## 2012-07-28 ENCOUNTER — Telehealth: Payer: Self-pay | Admitting: Internal Medicine

## 2012-07-28 DIAGNOSIS — M6281 Muscle weakness (generalized): Secondary | ICD-10-CM | POA: Diagnosis not present

## 2012-07-28 DIAGNOSIS — I2510 Atherosclerotic heart disease of native coronary artery without angina pectoris: Secondary | ICD-10-CM | POA: Diagnosis not present

## 2012-07-28 DIAGNOSIS — I10 Essential (primary) hypertension: Secondary | ICD-10-CM | POA: Diagnosis not present

## 2012-07-28 DIAGNOSIS — IMO0001 Reserved for inherently not codable concepts without codable children: Secondary | ICD-10-CM | POA: Diagnosis not present

## 2012-07-28 DIAGNOSIS — Z602 Problems related to living alone: Secondary | ICD-10-CM | POA: Diagnosis not present

## 2012-07-28 MED ORDER — CARVEDILOL 25 MG PO TABS: 25.0000 mg | ORAL_TABLET | Freq: Two times a day (BID) | ORAL | Status: DC

## 2012-07-28 NOTE — Telephone Encounter (Signed)
Refill: Carvedilol 25mg  tab. Take 1 tablet twice daily with meals. Qty 180.

## 2012-08-01 ENCOUNTER — Other Ambulatory Visit: Payer: Self-pay | Admitting: *Deleted

## 2012-08-01 DIAGNOSIS — Z602 Problems related to living alone: Secondary | ICD-10-CM | POA: Diagnosis not present

## 2012-08-01 DIAGNOSIS — IMO0001 Reserved for inherently not codable concepts without codable children: Secondary | ICD-10-CM | POA: Diagnosis not present

## 2012-08-01 DIAGNOSIS — M6281 Muscle weakness (generalized): Secondary | ICD-10-CM | POA: Diagnosis not present

## 2012-08-01 DIAGNOSIS — I10 Essential (primary) hypertension: Secondary | ICD-10-CM | POA: Diagnosis not present

## 2012-08-01 DIAGNOSIS — I2510 Atherosclerotic heart disease of native coronary artery without angina pectoris: Secondary | ICD-10-CM | POA: Diagnosis not present

## 2012-08-01 MED ORDER — CARVEDILOL 25 MG PO TABS: 25.0000 mg | ORAL_TABLET | Freq: Two times a day (BID) | ORAL | Status: DC

## 2012-08-01 NOTE — Telephone Encounter (Signed)
Refill for coreg sent to [facility].

## 2012-08-04 DIAGNOSIS — M6281 Muscle weakness (generalized): Secondary | ICD-10-CM | POA: Diagnosis not present

## 2012-08-04 DIAGNOSIS — IMO0001 Reserved for inherently not codable concepts without codable children: Secondary | ICD-10-CM | POA: Diagnosis not present

## 2012-08-04 DIAGNOSIS — I2510 Atherosclerotic heart disease of native coronary artery without angina pectoris: Secondary | ICD-10-CM | POA: Diagnosis not present

## 2012-08-04 DIAGNOSIS — Z602 Problems related to living alone: Secondary | ICD-10-CM | POA: Diagnosis not present

## 2012-08-04 DIAGNOSIS — I10 Essential (primary) hypertension: Secondary | ICD-10-CM | POA: Diagnosis not present

## 2012-08-05 ENCOUNTER — Telehealth: Payer: Self-pay | Admitting: Oncology

## 2012-08-05 NOTE — Telephone Encounter (Signed)
Talked to patient and r/s appt from 08/10/12 lab, MD and injections to 08/18/12 date per pt rqst, she is aware of appt

## 2012-08-10 ENCOUNTER — Other Ambulatory Visit: Payer: Medicare Other | Admitting: Lab

## 2012-08-10 ENCOUNTER — Ambulatory Visit: Payer: Medicare Other

## 2012-08-10 ENCOUNTER — Ambulatory Visit: Payer: Medicare Other | Admitting: Oncology

## 2012-08-11 DIAGNOSIS — IMO0001 Reserved for inherently not codable concepts without codable children: Secondary | ICD-10-CM | POA: Diagnosis not present

## 2012-08-11 DIAGNOSIS — M899 Disorder of bone, unspecified: Secondary | ICD-10-CM | POA: Diagnosis not present

## 2012-08-11 DIAGNOSIS — S329XXA Fracture of unspecified parts of lumbosacral spine and pelvis, initial encounter for closed fracture: Secondary | ICD-10-CM | POA: Diagnosis not present

## 2012-08-12 ENCOUNTER — Telehealth (HOSPITAL_COMMUNITY): Payer: Self-pay | Admitting: Cardiology

## 2012-08-12 DIAGNOSIS — I428 Other cardiomyopathies: Secondary | ICD-10-CM

## 2012-08-12 MED ORDER — BENAZEPRIL HCL 20 MG PO TABS: 20.0000 mg | ORAL_TABLET | Freq: Every day | ORAL | Status: DC

## 2012-08-12 NOTE — Telephone Encounter (Signed)
Pt called to request a refill of meds

## 2012-08-15 DIAGNOSIS — I2510 Atherosclerotic heart disease of native coronary artery without angina pectoris: Secondary | ICD-10-CM | POA: Diagnosis not present

## 2012-08-15 DIAGNOSIS — M6281 Muscle weakness (generalized): Secondary | ICD-10-CM | POA: Diagnosis not present

## 2012-08-15 DIAGNOSIS — I10 Essential (primary) hypertension: Secondary | ICD-10-CM | POA: Diagnosis not present

## 2012-08-15 DIAGNOSIS — IMO0001 Reserved for inherently not codable concepts without codable children: Secondary | ICD-10-CM | POA: Diagnosis not present

## 2012-08-15 DIAGNOSIS — Z602 Problems related to living alone: Secondary | ICD-10-CM | POA: Diagnosis not present

## 2012-08-17 DIAGNOSIS — Z9181 History of falling: Secondary | ICD-10-CM | POA: Diagnosis not present

## 2012-08-17 DIAGNOSIS — M810 Age-related osteoporosis without current pathological fracture: Secondary | ICD-10-CM | POA: Diagnosis not present

## 2012-08-18 ENCOUNTER — Telehealth: Payer: Self-pay | Admitting: Oncology

## 2012-08-18 ENCOUNTER — Ambulatory Visit: Payer: Medicare Other

## 2012-08-18 ENCOUNTER — Ambulatory Visit (HOSPITAL_BASED_OUTPATIENT_CLINIC_OR_DEPARTMENT_OTHER): Payer: Medicare Other | Admitting: Oncology

## 2012-08-18 ENCOUNTER — Other Ambulatory Visit (HOSPITAL_BASED_OUTPATIENT_CLINIC_OR_DEPARTMENT_OTHER): Payer: Medicare Other | Admitting: Lab

## 2012-08-18 VITALS — BP 150/97 | HR 99 | Temp 98.0°F | Resp 18 | Ht 66.0 in | Wt 171.2 lb

## 2012-08-18 DIAGNOSIS — D510 Vitamin B12 deficiency anemia due to intrinsic factor deficiency: Secondary | ICD-10-CM | POA: Diagnosis not present

## 2012-08-18 DIAGNOSIS — D696 Thrombocytopenia, unspecified: Secondary | ICD-10-CM

## 2012-08-18 DIAGNOSIS — D693 Immune thrombocytopenic purpura: Secondary | ICD-10-CM

## 2012-08-18 DIAGNOSIS — R002 Palpitations: Secondary | ICD-10-CM | POA: Diagnosis not present

## 2012-08-18 LAB — CBC WITH DIFFERENTIAL/PLATELET
Basophils Absolute: 0 10*3/uL (ref 0.0–0.1)
Eosinophils Absolute: 0.2 10*3/uL (ref 0.0–0.5)
HGB: 11.8 g/dL (ref 11.6–15.9)
MONO#: 0.5 10*3/uL (ref 0.1–0.9)
NEUT#: 3.3 10*3/uL (ref 1.5–6.5)
RBC: 3.58 10*6/uL — ABNORMAL LOW (ref 3.70–5.45)
RDW: 13.2 % (ref 11.2–14.5)
WBC: 5.7 10*3/uL (ref 3.9–10.3)
lymph#: 1.7 10*3/uL (ref 0.9–3.3)

## 2012-08-18 MED ORDER — CYANOCOBALAMIN 1000 MCG/ML IJ SOLN
1000.0000 ug | Freq: Once | INTRAMUSCULAR | Status: AC
  Administered 2012-08-18: 1000 ug via INTRAMUSCULAR

## 2012-08-18 NOTE — Patient Instructions (Addendum)
1.  Issue:  Thrombocytopenia (low platelet count). 2.  Potential cause: ITP (immune destruction of platelet) vs early presentation of bone marrow failure. 3.  Stable platelet count for the past few years.  4.  Recommendation:  Observation.  If platelet drop to less than 30, we may need to repeat bone marrow biopsy to rule out bone marrow problem and start appropriate therapy.

## 2012-08-18 NOTE — Progress Notes (Signed)
[facility]  Telephone:([phone]) [phone] Fax:(336) [phone]   OFFICE PROGRESS NOTE   Cc:  Unice Cobble, MD  DIAGNOSIS:  Chronic thrombocytopenia, unclear etiology.  Negative bone marrow biopsy in 2011.   CURRENT THERAPY:  Watchful observation.   INTERVAL HISTORY: [name] 79 y.o. female returns for regular follow up.  She complained of falling twice last year.  She denied chest pain, palpitation, SOB, dizziness, seizure with these episodes.  She has intermittent palpitation, feeling flutter in her chest the past few weeks.  These occur when she was at rest and did not worsen with activities. She plans to see Dr. Haroldine Laws next week to discuss this with him.  She has mild fatigue due to age; however, she still lives by herself and is independent of all activities of daily living.   Patient denies fever, anorexia, weight loss, fatigue, headache, visual changes, confusion, drenching night sweats, palpable lymph node swelling, mucositis, odynophagia, dysphagia, nausea vomiting, jaundice, chest pain, palpitation, shortness of breath, dyspnea on exertion, productive cough, gum bleeding, epistaxis, hematemesis, hemoptysis, abdominal pain, abdominal swelling, early satiety, melena, hematochezia, hematuria, skin rash, spontaneous bleeding, joint swelling, joint pain, heat or cold intolerance, bowel bladder incontinence, back pain, focal motor weakness, paresthesia, depression, suicidal or homicidal ideation, feeling hopelessness.   Past Medical History  Diagnosis Date  . 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 Resolved. --dx'd on cath 2005. EF 29% with minimal distal CAD (small OM1 70-80).  Echo 10/09 EF 55%;  echo 01/26/12: EF 55%, mild LAE, PASP 34.  Marland Kitchen Hypertension     SEVERE  . Idiopathic thrombocytopenic purpura (ITP)     DR. Jamse Arn  . Granuloma annulare   . History of phlebitis   . Lower extremity edema   . Osteoporosis   . Colitis, ischemic   .  Fall     with non-healing rib fractures  . Lumbar stenosis 2004    DR. MARK ROY  . AV block, 1st degree   . Complication of anesthesia     hard to wake up  . CAD (coronary artery disease)     Non ST elevation 2005 ; LHC in 6/05 in setting of NSTEMI: ant apical AK and inf-apical AK, EF 29%, dOM2 70-80% (small); findings c/w Tako-Tsubo CM  . Compression fracture of C-spine 10/10/2011    Fell at home, tx at [facility]  . Diarrhea associated with pseudomembranous colitis 6/11-17/2013    [facility]  . Bradycardia   . Dyslipidemia   . Multiple pelvic fractures 05/2012    Dr Adaline Sill , [facility]    Past Surgical History  Procedure Date  . Cataract extraction, bilateral   . Colonoscopy w/ polypectomy   . Abdominal hysterectomy   . Rotator cuff repair   . Corneal transplant 11-27-02    left eye  . Cardiac catheterization     12/2003  . Back surgery 10/2004    LS Disc 4-5 no sx.  . Colonoscopy 09/2005    adenoma  . Rectal bleed 05-01-06    colonoscopy-ischemic colitis  . Appendectomy   . Vertebroplasty     Dr Gladstone Lighter    Current Outpatient Prescriptions  Medication Sig Dispense Refill  . benazepril (LOTENSIN) 20 MG tablet Take 1 tablet (20 mg total) by mouth daily.  30 tablet  11  . beta carotene w/minerals (OCUVITE) tablet Take 1 tablet by mouth daily.       . brimonidine (ALPHAGAN) 0.2 % ophthalmic solution Place 1 drop into  the right eye 2 (two) times daily.        . Calcium Carbonate-Vitamin D (CALTRATE 600+D PO) Take by mouth 2 (two) times daily.      . carboxymethylcellulose (REFRESH TEARS) 0.5 % SOLN Place 1 drop into both eyes 2 (two) times daily.       . carvedilol (COREG) 25 MG tablet Take 1 tablet (25 mg total) by mouth 2 (two) times daily with a meal.  180 tablet  1  . Cyanocobalamin 1000 MCG/ML KIT Inject as directed every 30 (thirty) days.      . fluticasone (FLONASE) 50 MCG/ACT nasal spray Place 1 spray into the nose 2 (two) times daily.       . furosemide (LASIX) 40 MG  tablet Take 1 tablet (40 mg total) by mouth daily.      Marland Kitchen ibuprofen (ADVIL,MOTRIN) 200 MG tablet Take 200 mg by mouth as needed.       . Misc Natural Products (OSTEO BI-FLEX ADV JOINT SHIELD) TABS Take 2 tablets by mouth daily.        Marland Kitchen omeprazole (PRILOSEC) 20 MG capsule Take 20 mg by mouth daily.        . potassium chloride SA (K-DUR,KLOR-CON) 20 MEQ tablet TAKE 1 TABLET EVERY OTHER DAY      . TERIPARATIDE, RECOMBINANT, [place] Inject 20 mcg into the skin daily.      . timolol (BETIMOL) 0.5 % ophthalmic solution Place 1 drop into the right eye 2 (two) times daily.        ALLERGIES:  is allergic to oxycodone; penicillins; alendronate sodium; colchicine; norvasc; streptomycin; and morphine and related.  REVIEW OF SYSTEMS:  The rest of the 14-point review of system was negative.   Filed Vitals:   08/18/12 1340  BP: 150/97  Pulse: 99  Temp: 98 F (36.7 C)  Resp: 18   Wt Readings from Last 3 Encounters:  08/18/12 171 lb 3.2 oz (77.656 kg)  07/12/12 169 lb 12.8 oz (77.021 kg)  04/12/12 170 lb (77.111 kg)   ECOG Performance status: 1  PHYSICAL EXAMINATION:   General:  well-nourished in no acute distress.  Eyes:  no scleral icterus.  ENT:  There were no oropharyngeal lesions.  Neck was without thyromegaly.  Lymphatics:  Negative cervical, supraclavicular or axillary adenopathy.  Respiratory: lungs were clear bilaterally without wheezing or crackles.  Cardiovascular:  Regular rate and rhythm, S1/S2, without murmur, rub or gallop.  There was no pedal edema.  GI:  abdomen was soft, flat, nontender, nondistended, without organomegaly.  Muscoloskeletal:  no spinal tenderness of palpation of vertebral spine.  Skin exam was without echymosis, petichae.  Neuro exam was nonfocal.  Patient was able to get on and off exam table without assistance.  Gait was normal.  Patient was alerted and oriented.  Attention was good.   Language was appropriate.  Mood was normal without depression.  Speech was not  pressured.  Thought content was not tangential.      LABORATORY/RADIOLOGY DATA:  Lab Results  Component Value Date   WBC 5.7 08/18/2012   HGB 11.8 08/18/2012   HCT 34.4* 08/18/2012   PLT 61* 08/18/2012   GLUCOSE 96 04/12/2012   CHOL  Value: 112        ATP III CLASSIFICATION:  <200     mg/dL   Desirable  200-239  mg/dL   Borderline High  >=240    mg/dL   High 12/14/2007   TRIG 97 12/14/2007   HDL 24*  12/14/2007   LDLCALC  Value: 69        Total Cholesterol/HDL:CHD Risk Coronary Heart Disease Risk Table                     Men   Women  1/2 Average Risk   3.4   3.3 12/14/2007   ALKPHOS 105 02/09/2012   ALT 8 02/09/2012   AST 16 02/09/2012   NA 140 04/12/2012   K 4.7 04/12/2012   CL 106 04/12/2012   CREATININE 1.9* 04/12/2012   BUN 36.0* 04/12/2012   CO2 23 04/12/2012   INR 1.1 12/13/2007   HGBA1C 5.7 11/08/2009     ASSESSMENT AND PLAN:   1.  Issue:  Thrombocytopenia  2.  Potential cause: ITP (immune destruction of platelet; however, this normally causes much lower thrombocytopenia) vs early presentation of bone marrow failure (however, her bone marrow biopsy in 2011 was negative).  3.  Status:  Stable platelet count for the past few years.  4.  Recommendation:  Observation.  If platelet drop to less than 30, we may need to repeat bone marrow biopsy to rule out bone marrow problem and start appropriate therapy.  There is low risk of spontaneous bleeding with platelet >30.   5.  Palpitation:  She had history of cardiac arrest in the past.  I strongly advised her to discuss this issue with her B[place]hon next week but present to ED if she has chest pain or persistent palpation in the mean time.   [name] expressed informed understanding and agreed with watchful observation.   The length of time of the face-to-face encounter was 15 minutes. More than 50% of time was spent counseling and coordination of care.

## 2012-08-18 NOTE — Telephone Encounter (Signed)
Gave pt appt for 1 year lab,injection and ML until February 2014

## 2012-08-24 ENCOUNTER — Encounter (HOSPITAL_COMMUNITY): Payer: Medicare Other

## 2012-08-29 ENCOUNTER — Ambulatory Visit (HOSPITAL_COMMUNITY)
Admission: RE | Admit: 2012-08-29 | Discharge: 2012-08-29 | Disposition: A | Discharge status: Home / Self Care | Payer: Medicare Other | Source: Ambulatory Visit | Attending: Internal Medicine | Admitting: Internal Medicine

## 2012-08-29 ENCOUNTER — Encounter (HOSPITAL_COMMUNITY): Payer: Self-pay

## 2012-08-29 VITALS — BP 132/90 | HR 99 | Wt 171.1 lb

## 2012-08-29 DIAGNOSIS — I5032 Chronic diastolic (congestive) heart failure: Secondary | ICD-10-CM

## 2012-08-29 DIAGNOSIS — I509 Heart failure, unspecified: Secondary | ICD-10-CM | POA: Diagnosis not present

## 2012-08-29 NOTE — Progress Notes (Signed)
Patient ID: [name], female   DOB: April 23, 1923, 79 y.o.   MRN: [id number] PCP: Dr Linna Darner HPI: [name] is a 79 y.o. 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 LHC in 6/05 in setting of NSTEMI: ant apical AK and inf-apical AK, EF 29%, dOM2 70-80% (small). EF has recovered. Also has a h/o HTN, SVT and fractures pelvis 05/2012. 02/2012  Discharged form [facility] after a fall which resulted in a cervical fracture.   In 6/13 presented to ED with tachycardia after reducing carvedilol to 12.5 mg bid. Carvedilol increased to 25 mg twice a day.   7/13 ECHO EF 55%  She returns for follow up.  Denies SOB/PND/Orthopnea. Sleeps on one pillow. Weight at home 167-171 pounds. She has not required any extra Lasix. She only takes extra lasix if she is bloated or she has lower extremity edema. Ambulates with a cane.  Compliant with medications. Follows low salt diet. Lives alone and continues perform all ADLs independently.   ROS: All systems negative except as listed in HPI, PMH and Problem List.  Past Medical History  Diagnosis Date  . 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 Resolved. --dx'd on cath 2005. EF 29% with minimal distal CAD (small OM1 70-80).  Echo 10/09 EF 55%;  echo 01/26/12: EF 55%, mild LAE, PASP 34.  Marland Kitchen Hypertension     SEVERE  . Idiopathic thrombocytopenic purpura (ITP)     DR. Jamse Arn  . Granuloma annulare   . History of phlebitis   . Lower extremity edema   . Osteoporosis   . Colitis, ischemic   . Fall     with non-healing rib fractures  . Lumbar stenosis 2004    DR. MARK ROY  . AV block, 1st degree   . Complication of anesthesia     hard to wake up  . CAD (coronary artery disease)     Non ST elevation 2005 ; LHC in 6/05 in setting of NSTEMI: ant apical AK and inf-apical AK, EF 29%, dOM2 70-80% (small); findings c/w Tako-Tsubo CM  . Compression fracture of C-spine 10/10/2011    Fell at home, tx at [facility]  . Diarrhea associated  with pseudomembranous colitis 6/11-17/2013    [facility]  . Bradycardia   . Dyslipidemia   . Multiple pelvic fractures 05/2012    Dr Adaline Sill , [facility]    Current Outpatient Prescriptions  Medication Sig Dispense Refill  . benazepril (LOTENSIN) 20 MG tablet Take 1 tablet (20 mg total) by mouth daily.  30 tablet  11  . beta carotene w/minerals (OCUVITE) tablet Take 1 tablet by mouth daily.       . brimonidine (ALPHAGAN) 0.2 % ophthalmic solution Place 1 drop into the right eye 2 (two) times daily.        . Calcium Carbonate-Vitamin D (CALTRATE 600+D PO) Take by mouth 2 (two) times daily.      . carboxymethylcellulose (REFRESH TEARS) 0.5 % SOLN Place 1 drop into both eyes 2 (two) times daily.       . carvedilol (COREG) 25 MG tablet Take 1 tablet (25 mg total) by mouth 2 (two) times daily with a meal.  180 tablet  1  . Cyanocobalamin 1000 MCG/ML KIT Inject as directed every 30 (thirty) days.      . fluticasone (FLONASE) 50 MCG/ACT nasal spray Place 1 spray into the nose 2 (two) times daily.       . furosemide (LASIX) 40 MG tablet  Take 1 tablet (40 mg total) by mouth daily.      Marland Kitchen ibuprofen (ADVIL,MOTRIN) 200 MG tablet Take 200 mg by mouth as needed.       . Misc Natural Products (OSTEO BI-FLEX ADV JOINT SHIELD) TABS Take 2 tablets by mouth daily.        Marland Kitchen omeprazole (PRILOSEC) 20 MG capsule Take 20 mg by mouth daily.        . potassium chloride SA (K-DUR,KLOR-CON) 20 MEQ tablet TAKE 1 TABLET EVERY OTHER DAY      . TERIPARATIDE, RECOMBINANT,  Inject 20 mcg into the skin daily.      . timolol (BETIMOL) 0.5 % ophthalmic solution Place 1 drop into the right eye 2 (two) times daily.       No current facility-administered medications for this encounter.     PHYSICAL EXAM: Filed Vitals:   08/29/12 1514  BP: 132/90  Pulse: 99  Weight: 171 lb 1.9 oz (77.62 kg)  SpO2: 97%    General:  Elderly Well appearing. No resp difficulty HEENT: normal Neck: supple. JVP 5-6. Carotids 2+  bilaterally; no bruits. No lymphadenopathy or thryomegaly appreciated. Cor: PMI normal. Regular rate & rhythm. No rubs, gallops or murmurs. Lungs: clear Abdomen: soft, nontender, nondistended. No hepatosplenomegaly. No bruits or masses. Good bowel sounds. Extremities: no cyanosis, clubbing, rash, no edema. Neuro: alert & orientedx3, cranial nerves grossly intact. Moves all 4 extremities w/o difficulty. Affect pleasant.      ASSESSMENT & PLAN:

## 2012-08-29 NOTE — Assessment & Plan Note (Signed)
Volume status stable. Continue current diuretic regimen. She will continue to take an additional Lasix for bloating or lower extremity edema. Weight target provided in past however she prefers to take extra lasix based on her symptoms. Follow up in 4 months.

## 2012-08-29 NOTE — Patient Instructions (Addendum)
Follow up in 4 months with Dr Haroldine Laws   Do the following things EVERYDAY: 1) Weigh yourself in the morning before breakfast. Write it down and keep it in a log. 2) Take your medicines as prescribed 3) Eat low salt foods-Limit salt (sodium) to 2000 mg per day.  4) Stay as active as you can everyday 5) Limit all fluids for the day to less than 2 liters

## 2012-08-30 DIAGNOSIS — H18519 Endothelial corneal dystrophy, unspecified eye: Secondary | ICD-10-CM | POA: Diagnosis not present

## 2012-08-30 DIAGNOSIS — Z961 Presence of intraocular lens: Secondary | ICD-10-CM | POA: Diagnosis not present

## 2012-08-30 DIAGNOSIS — Z947 Corneal transplant status: Secondary | ICD-10-CM | POA: Diagnosis not present

## 2012-08-30 DIAGNOSIS — H353190 Nonexudative age-related macular degeneration, unspecified eye, stage unspecified: Secondary | ICD-10-CM | POA: Diagnosis not present

## 2012-09-02 DIAGNOSIS — Z947 Corneal transplant status: Secondary | ICD-10-CM | POA: Diagnosis not present

## 2012-09-02 DIAGNOSIS — H17819 Minor opacity of cornea, unspecified eye: Secondary | ICD-10-CM | POA: Diagnosis not present

## 2012-09-02 DIAGNOSIS — H353190 Nonexudative age-related macular degeneration, unspecified eye, stage unspecified: Secondary | ICD-10-CM | POA: Diagnosis not present

## 2012-09-02 DIAGNOSIS — Z961 Presence of intraocular lens: Secondary | ICD-10-CM | POA: Diagnosis not present

## 2012-09-02 DIAGNOSIS — Z48298 Encounter for aftercare following other organ transplant: Secondary | ICD-10-CM | POA: Diagnosis not present

## 2012-09-12 ENCOUNTER — Encounter: Payer: Self-pay | Admitting: Internal Medicine

## 2012-09-15 ENCOUNTER — Ambulatory Visit (HOSPITAL_BASED_OUTPATIENT_CLINIC_OR_DEPARTMENT_OTHER): Payer: Medicare Other

## 2012-09-15 VITALS — BP 128/63 | HR 72 | Temp 98.2°F

## 2012-09-15 DIAGNOSIS — D510 Vitamin B12 deficiency anemia due to intrinsic factor deficiency: Secondary | ICD-10-CM | POA: Diagnosis not present

## 2012-09-15 MED ORDER — CYANOCOBALAMIN 1000 MCG/ML IJ SOLN
1000.0000 ug | Freq: Once | INTRAMUSCULAR | Status: AC
  Administered 2012-09-15: 1000 ug via INTRAMUSCULAR

## 2012-10-07 ENCOUNTER — Other Ambulatory Visit: Payer: Self-pay | Admitting: Oncology

## 2012-10-07 DIAGNOSIS — E538 Deficiency of other specified B group vitamins: Secondary | ICD-10-CM

## 2012-10-13 ENCOUNTER — Ambulatory Visit (HOSPITAL_BASED_OUTPATIENT_CLINIC_OR_DEPARTMENT_OTHER): Payer: Medicare Other

## 2012-10-13 VITALS — BP 142/79 | HR 98 | Temp 98.6°F

## 2012-10-13 DIAGNOSIS — D510 Vitamin B12 deficiency anemia due to intrinsic factor deficiency: Secondary | ICD-10-CM | POA: Diagnosis not present

## 2012-10-13 DIAGNOSIS — E538 Deficiency of other specified B group vitamins: Secondary | ICD-10-CM

## 2012-10-13 MED ORDER — CYANOCOBALAMIN 1000 MCG/ML IJ SOLN
1000.0000 ug | Freq: Once | INTRAMUSCULAR | Status: AC
  Administered 2012-10-13: 1000 ug via INTRAMUSCULAR

## 2012-10-28 ENCOUNTER — Telehealth: Payer: Self-pay | Admitting: *Deleted

## 2012-10-28 NOTE — Telephone Encounter (Signed)
Lm informing pt that we will be closed on New Years Day . gv appt d/t for 07/14/13@2pm . Also made her aware that i will mail her a letter/cal as well...td

## 2012-11-10 ENCOUNTER — Ambulatory Visit (HOSPITAL_BASED_OUTPATIENT_CLINIC_OR_DEPARTMENT_OTHER): Payer: Medicare Other

## 2012-11-10 ENCOUNTER — Other Ambulatory Visit (HOSPITAL_BASED_OUTPATIENT_CLINIC_OR_DEPARTMENT_OTHER): Payer: Medicare Other | Admitting: Lab

## 2012-11-10 ENCOUNTER — Encounter: Payer: Self-pay | Admitting: Oncology

## 2012-11-10 VITALS — BP 121/63 | HR 102 | Temp 98.5°F

## 2012-11-10 DIAGNOSIS — E538 Deficiency of other specified B group vitamins: Secondary | ICD-10-CM

## 2012-11-10 DIAGNOSIS — D696 Thrombocytopenia, unspecified: Secondary | ICD-10-CM | POA: Diagnosis not present

## 2012-11-10 DIAGNOSIS — D510 Vitamin B12 deficiency anemia due to intrinsic factor deficiency: Secondary | ICD-10-CM

## 2012-11-10 LAB — CBC WITH DIFFERENTIAL/PLATELET
Eosinophils Absolute: 0.3 10*3/uL (ref 0.0–0.5)
MONO#: 0.5 10*3/uL (ref 0.1–0.9)
NEUT#: 4.5 10*3/uL (ref 1.5–6.5)
RBC: 3.65 10*6/uL — ABNORMAL LOW (ref 3.70–5.45)
RDW: 13.2 % (ref 11.2–14.5)
WBC: 6.7 10*3/uL (ref 3.9–10.3)

## 2012-11-10 MED ORDER — CYANOCOBALAMIN 1000 MCG/ML IJ SOLN
1000.0000 ug | Freq: Once | INTRAMUSCULAR | Status: AC
  Administered 2012-11-10: 1000 ug via INTRAMUSCULAR

## 2012-11-15 ENCOUNTER — Other Ambulatory Visit: Payer: Medicare Other | Admitting: Lab

## 2012-11-16 DIAGNOSIS — M810 Age-related osteoporosis without current pathological fracture: Secondary | ICD-10-CM | POA: Diagnosis not present

## 2012-11-29 DIAGNOSIS — H353190 Nonexudative age-related macular degeneration, unspecified eye, stage unspecified: Secondary | ICD-10-CM | POA: Diagnosis not present

## 2012-11-29 DIAGNOSIS — H18519 Endothelial corneal dystrophy, unspecified eye: Secondary | ICD-10-CM | POA: Diagnosis not present

## 2012-11-29 DIAGNOSIS — Z961 Presence of intraocular lens: Secondary | ICD-10-CM | POA: Diagnosis not present

## 2012-11-29 DIAGNOSIS — Z947 Corneal transplant status: Secondary | ICD-10-CM | POA: Diagnosis not present

## 2012-11-29 DIAGNOSIS — H43819 Vitreous degeneration, unspecified eye: Secondary | ICD-10-CM | POA: Diagnosis not present

## 2012-12-13 ENCOUNTER — Other Ambulatory Visit: Payer: Medicare Other

## 2012-12-15 ENCOUNTER — Other Ambulatory Visit: Payer: Medicare Other | Admitting: Lab

## 2012-12-15 ENCOUNTER — Ambulatory Visit (HOSPITAL_BASED_OUTPATIENT_CLINIC_OR_DEPARTMENT_OTHER): Payer: Medicare Other

## 2012-12-15 VITALS — BP 138/69 | HR 101 | Temp 98.3°F

## 2012-12-15 DIAGNOSIS — E538 Deficiency of other specified B group vitamins: Secondary | ICD-10-CM

## 2012-12-15 MED ORDER — CYANOCOBALAMIN 1000 MCG/ML IJ SOLN
1000.0000 ug | Freq: Once | INTRAMUSCULAR | Status: AC
  Administered 2012-12-15: 1000 ug via INTRAMUSCULAR

## 2012-12-23 ENCOUNTER — Other Ambulatory Visit: Payer: Self-pay | Admitting: Internal Medicine

## 2013-01-10 ENCOUNTER — Other Ambulatory Visit: Payer: Medicare Other | Admitting: Lab

## 2013-01-12 ENCOUNTER — Ambulatory Visit (HOSPITAL_BASED_OUTPATIENT_CLINIC_OR_DEPARTMENT_OTHER): Payer: Medicare Other

## 2013-01-12 VITALS — BP 116/62 | HR 112 | Temp 98.7°F

## 2013-01-12 DIAGNOSIS — E538 Deficiency of other specified B group vitamins: Secondary | ICD-10-CM | POA: Diagnosis not present

## 2013-01-12 MED ORDER — CYANOCOBALAMIN 1000 MCG/ML IJ SOLN
1000.0000 ug | Freq: Once | INTRAMUSCULAR | Status: AC
  Administered 2013-01-12: 1000 ug via INTRAMUSCULAR

## 2013-01-29 ENCOUNTER — Other Ambulatory Visit: Payer: Self-pay | Admitting: Internal Medicine

## 2013-01-30 DIAGNOSIS — L538 Other specified erythematous conditions: Secondary | ICD-10-CM | POA: Diagnosis not present

## 2013-01-30 DIAGNOSIS — Z79899 Other long term (current) drug therapy: Secondary | ICD-10-CM | POA: Diagnosis not present

## 2013-01-30 DIAGNOSIS — K141 Geographic tongue: Secondary | ICD-10-CM | POA: Diagnosis not present

## 2013-02-07 ENCOUNTER — Other Ambulatory Visit: Payer: Self-pay

## 2013-02-07 ENCOUNTER — Ambulatory Visit (INDEPENDENT_AMBULATORY_CARE_PROVIDER_SITE_OTHER): Payer: Medicare Other | Admitting: *Deleted

## 2013-02-07 ENCOUNTER — Ambulatory Visit (HOSPITAL_COMMUNITY)
Admission: RE | Admit: 2013-02-07 | Discharge: 2013-02-07 | Disposition: A | Discharge status: Home / Self Care | Payer: Medicare Other | Source: Ambulatory Visit | Attending: Internal Medicine | Admitting: Internal Medicine

## 2013-02-07 ENCOUNTER — Encounter (HOSPITAL_COMMUNITY): Payer: Self-pay

## 2013-02-07 VITALS — BP 138/80 | HR 100 | Wt 175.1 lb

## 2013-02-07 DIAGNOSIS — I2510 Atherosclerotic heart disease of native coronary artery without angina pectoris: Secondary | ICD-10-CM | POA: Diagnosis not present

## 2013-02-07 DIAGNOSIS — I428 Other cardiomyopathies: Secondary | ICD-10-CM

## 2013-02-07 DIAGNOSIS — E785 Hyperlipidemia, unspecified: Secondary | ICD-10-CM | POA: Diagnosis not present

## 2013-02-07 DIAGNOSIS — M810 Age-related osteoporosis without current pathological fracture: Secondary | ICD-10-CM | POA: Diagnosis not present

## 2013-02-07 DIAGNOSIS — I252 Old myocardial infarction: Secondary | ICD-10-CM | POA: Diagnosis not present

## 2013-02-07 DIAGNOSIS — I5032 Chronic diastolic (congestive) heart failure: Secondary | ICD-10-CM | POA: Diagnosis not present

## 2013-02-07 DIAGNOSIS — R0602 Shortness of breath: Secondary | ICD-10-CM | POA: Diagnosis not present

## 2013-02-07 DIAGNOSIS — N179 Acute kidney failure, unspecified: Secondary | ICD-10-CM

## 2013-02-07 DIAGNOSIS — D510 Vitamin B12 deficiency anemia due to intrinsic factor deficiency: Secondary | ICD-10-CM

## 2013-02-07 DIAGNOSIS — Z79899 Other long term (current) drug therapy: Secondary | ICD-10-CM | POA: Diagnosis not present

## 2013-02-07 DIAGNOSIS — I10 Essential (primary) hypertension: Secondary | ICD-10-CM | POA: Insufficient documentation

## 2013-02-07 DIAGNOSIS — D696 Thrombocytopenia, unspecified: Secondary | ICD-10-CM | POA: Diagnosis not present

## 2013-02-07 DIAGNOSIS — K559 Vascular disorder of intestine, unspecified: Secondary | ICD-10-CM | POA: Diagnosis not present

## 2013-02-07 DIAGNOSIS — I440 Atrioventricular block, first degree: Secondary | ICD-10-CM | POA: Insufficient documentation

## 2013-02-07 DIAGNOSIS — I5181 Takotsubo syndrome: Secondary | ICD-10-CM | POA: Diagnosis not present

## 2013-02-07 DIAGNOSIS — I509 Heart failure, unspecified: Secondary | ICD-10-CM

## 2013-02-07 LAB — BASIC METABOLIC PANEL
GFR: 45.46 mL/min — ABNORMAL LOW (ref >60.00)
Glucose, Bld: 93 mg/dL (ref 70–99)
Potassium: 4.4 mEq/L (ref 3.5–5.1)
Sodium: 140 mEq/L (ref 135–145)

## 2013-02-07 MED ORDER — BENAZEPRIL HCL 20 MG PO TABS: 20.0000 mg | ORAL_TABLET | Freq: Every day | ORAL | Status: DC

## 2013-02-07 NOTE — Assessment & Plan Note (Signed)
Volume status stable. Occasional dyspnea. Continue current diuretic regimen. Check BMET and pro bnp. Repeat ECHO. Follow up in 6 months.

## 2013-02-07 NOTE — Patient Instructions (Addendum)
The current medical regimen is effective;  continue present plan and medications.  Your physician has requested that you have an echocardiogram. Echocardiography is a painless test that uses sound waves to create images of your heart. It provides your doctor with information about the size and shape of your heart and how well your heart's chambers and valves are working. This procedure takes approximately one hour. There are no restrictions for this procedure.  Parking code for August 0002.   Follow up in 6 months   Do the following things EVERYDAY: 1) Weigh yourself in the morning before breakfast. Write it down and keep it in a log. 2) Take your medicines as prescribed 3) Eat low salt foods-Limit salt (sodium) to 2000 mg per day.  4) Stay as active as you can everyday 5) Limit all fluids for the day to less than 2 liters

## 2013-02-07 NOTE — Progress Notes (Signed)
Patient ID: [name], female   DOB: [birth year], 79 y.o.   MRN: [id number] PCP: Dr Linna Darner HPI: [name] is a 79 y.o. 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 LHC in 6/05 in setting of NSTEMI: ant apical AK and inf-apical AK, EF 29%, dOM2 70-80% (small). EF has recovered. Also has a h/o HTN, SVT and fractures pelvis 05/2012. 02/2012  Discharged form [place] after a fall which resulted in a cervical fracture.   7/13 ECHO EF 55%  She returns for follow up.  Denies SOB/PND/Orthopnea. She does admit to occasional dyspnea with exertion. Sleeps on one pillow. Weight at home 165-170  pounds. She has not required any extra Lasix. She will take extra lasix when she is bloated.  Ambulates with a cane.  Compliant with medications. Follows low salt diet. Lives alone and continues perform all ADLs independently.   ROS: All systems negative except as listed in HPI, PMH and Problem List.  Past Medical History  Diagnosis Date  . 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 Resolved. --dx'd on cath 2005. EF 29% with minimal distal CAD (small OM1 70-80).  Echo 10/09 EF 55%;  echo 01/26/12: EF 55%, mild LAE, PASP 34.  Marland Kitchen Hypertension     SEVERE  . Idiopathic thrombocytopenic purpura (ITP)     DR. Jamse Arn  . Granuloma annulare   . History of phlebitis   . Lower extremity edema   . Osteoporosis   . Colitis, ischemic   . Fall     with non-healing rib fractures  . Lumbar stenosis 2004    DR. MARK ROY  . AV block, 1st degree   . Complication of anesthesia     hard to wake up  . CAD (coronary artery disease)     Non ST elevation 2005 ; LHC in 6/05 in setting of NSTEMI: ant apical AK and inf-apical AK, EF 29%, dOM2 70-80% (small); findings c/w Tako-Tsubo CM  . Compression fracture of C-spine 10/10/2011    Fell at home, tx at [facility]  . Diarrhea associated with pseudomembranous colitis 6/11-17/2013    [facility]  . Bradycardia   . Dyslipidemia   . Multiple  pelvic fractures 05/2012    Dr Adaline Sill , [facility]    Current Outpatient Prescriptions  Medication Sig Dispense Refill  . benazepril (LOTENSIN) 20 MG tablet Take 1 tablet (20 mg total) by mouth daily.  30 tablet  11  . beta carotene w/minerals (OCUVITE) tablet Take 1 tablet by mouth daily.       . bisacodyl (DULCOLAX) 5 MG EC tablet Take 5 mg by mouth daily as needed for constipation.      . brimonidine (ALPHAGAN) 0.2 % ophthalmic solution Place 1 drop into the right eye 2 (two) times daily.        . Calcium Carbonate-Vitamin D (CALTRATE 600+D PO) Take by mouth 2 (two) times daily.      . carboxymethylcellulose (REFRESH TEARS) 0.5 % SOLN Place 1 drop into both eyes 2 (two) times daily.       . carvedilol (COREG) 25 MG tablet Take 1 tablet (25 mg total) by mouth 2 (two) times daily with a meal.  180 tablet  1  . Cyanocobalamin 1000 MCG/ML KIT Inject as directed every 30 (thirty) days.      . fluticasone (FLONASE) 50 MCG/ACT nasal spray Place 1 spray into the nose 2 (two) times daily.       . furosemide (LASIX) 40  MG tablet TAKE 1 TABLET (40 MG TOTAL) BY MOUTH DAILY.  90 tablet  1  . ibuprofen (ADVIL,MOTRIN) 200 MG tablet Take 200 mg by mouth as needed.       . Misc Natural Products (OSTEO BI-FLEX ADV JOINT SHIELD) TABS Take 2 tablets by mouth daily.        Marland Kitchen omeprazole (PRILOSEC) 20 MG capsule Take 20 mg by mouth daily.        . potassium chloride SA (K-DUR,KLOR-CON) 20 MEQ tablet TAKE 1 TABLET EVERY OTHER DAY      . TERIPARATIDE, RECOMBINANT, [place] Inject 20 mcg into the skin daily.      . timolol (BETIMOL) 0.5 % ophthalmic solution Place 1 drop into the right eye 2 (two) times daily.      . timolol (TIMOPTIC) 0.25 % ophthalmic solution Place 1 drop into the right eye 2 (two) times daily.       No current facility-administered medications for this encounter.     PHYSICAL EXAM: Filed Vitals:   02/07/13 1119  BP: 138/80  Pulse: 100  Weight: 175 lb 1.9 oz (79.434 kg)  SpO2: 97%     General:  Elderly Well appearing. No resp difficulty HEENT: normal Neck: supple. JVP ~6. Carotids 2+ bilaterally; no bruits. No lymphadenopathy or thryomegaly appreciated. Cor: PMI normal. Regular rate & rhythm. No rubs, gallops or murmurs. Lungs: clear Abdomen: soft, nontender, nondistended. No hepatosplenomegaly. No bruits or masses. Good bowel sounds. Extremities: no cyanosis, clubbing, rash, no edema. Neuro: alert & orientedx3, cranial nerves grossly intact. Moves all 4 extremities w/o difficulty. Affect pleasant.       ASSESSMENT & PLAN:

## 2013-02-07 NOTE — Assessment & Plan Note (Signed)
Stable  Continue current regimen  

## 2013-02-09 DIAGNOSIS — S322XXA Fracture of coccyx, initial encounter for closed fracture: Secondary | ICD-10-CM | POA: Diagnosis not present

## 2013-02-09 DIAGNOSIS — M549 Dorsalgia, unspecified: Secondary | ICD-10-CM | POA: Diagnosis not present

## 2013-02-09 DIAGNOSIS — IMO0001 Reserved for inherently not codable concepts without codable children: Secondary | ICD-10-CM | POA: Diagnosis not present

## 2013-02-09 DIAGNOSIS — S72009D Fracture of unspecified part of neck of unspecified femur, subsequent encounter for closed fracture with routine healing: Secondary | ICD-10-CM | POA: Diagnosis not present

## 2013-02-09 DIAGNOSIS — M169 Osteoarthritis of hip, unspecified: Secondary | ICD-10-CM | POA: Diagnosis not present

## 2013-02-09 DIAGNOSIS — S3210XA Unspecified fracture of sacrum, initial encounter for closed fracture: Secondary | ICD-10-CM | POA: Diagnosis not present

## 2013-02-15 ENCOUNTER — Ambulatory Visit (HOSPITAL_COMMUNITY)
Admission: RE | Admit: 2013-02-15 | Discharge: 2013-02-15 | Disposition: A | Discharge status: Home / Self Care | Payer: Medicare Other | Source: Ambulatory Visit | Attending: Internal Medicine | Admitting: Internal Medicine

## 2013-02-15 DIAGNOSIS — I5181 Takotsubo syndrome: Secondary | ICD-10-CM | POA: Insufficient documentation

## 2013-02-15 DIAGNOSIS — I5032 Chronic diastolic (congestive) heart failure: Secondary | ICD-10-CM

## 2013-02-15 DIAGNOSIS — I509 Heart failure, unspecified: Secondary | ICD-10-CM | POA: Diagnosis not present

## 2013-02-15 DIAGNOSIS — R0989 Other specified symptoms and signs involving the circulatory and respiratory systems: Secondary | ICD-10-CM | POA: Insufficient documentation

## 2013-02-15 DIAGNOSIS — I10 Essential (primary) hypertension: Secondary | ICD-10-CM | POA: Diagnosis not present

## 2013-02-15 DIAGNOSIS — R0609 Other forms of dyspnea: Secondary | ICD-10-CM | POA: Insufficient documentation

## 2013-02-15 DIAGNOSIS — I517 Cardiomegaly: Secondary | ICD-10-CM

## 2013-02-15 NOTE — Progress Notes (Signed)
Echo Lab  2D Echocardiogram completed.  [place], RDCS 02/15/2013 12:01 PM

## 2013-02-16 ENCOUNTER — Ambulatory Visit (HOSPITAL_BASED_OUTPATIENT_CLINIC_OR_DEPARTMENT_OTHER): Payer: Medicare Other

## 2013-02-16 ENCOUNTER — Other Ambulatory Visit (HOSPITAL_BASED_OUTPATIENT_CLINIC_OR_DEPARTMENT_OTHER): Payer: Medicare Other | Admitting: Lab

## 2013-02-16 VITALS — BP 138/70 | HR 109 | Temp 98.6°F

## 2013-02-16 DIAGNOSIS — E538 Deficiency of other specified B group vitamins: Secondary | ICD-10-CM

## 2013-02-16 DIAGNOSIS — D696 Thrombocytopenia, unspecified: Secondary | ICD-10-CM | POA: Diagnosis not present

## 2013-02-16 DIAGNOSIS — D510 Vitamin B12 deficiency anemia due to intrinsic factor deficiency: Secondary | ICD-10-CM

## 2013-02-16 LAB — CBC WITH DIFFERENTIAL/PLATELET
Basophils Absolute: 0 10*3/uL (ref 0.0–0.1)
Eosinophils Absolute: 0.3 10*3/uL (ref 0.0–0.5)
HCT: 34.3 % — ABNORMAL LOW (ref 34.8–46.6)
HGB: 11.8 g/dL (ref 11.6–15.9)
MONO#: 0.5 10*3/uL (ref 0.1–0.9)
NEUT#: 4.3 10*3/uL (ref 1.5–6.5)
NEUT%: 65.7 % (ref 38.4–76.8)
WBC: 6.6 10*3/uL (ref 3.9–10.3)
lymph#: 1.4 10*3/uL (ref 0.9–3.3)

## 2013-02-16 LAB — COMPREHENSIVE METABOLIC PANEL (CC13)
Albumin: 3.5 g/dL (ref 3.5–5.0)
BUN: 18 mg/dL (ref 7.0–26.0)
CO2: 26 mEq/L (ref 22–29)
Calcium: 9.4 mg/dL (ref 8.4–10.4)
Chloride: 107 mEq/L (ref 98–109)
Creatinine: 1.1 mg/dL (ref 0.6–1.1)
Potassium: 4.6 mEq/L (ref 3.5–5.1)

## 2013-02-16 MED ORDER — CYANOCOBALAMIN 1000 MCG/ML IJ SOLN
1000.0000 ug | Freq: Once | INTRAMUSCULAR | Status: AC
  Administered 2013-02-16: 1000 ug via INTRAMUSCULAR

## 2013-02-17 ENCOUNTER — Telehealth: Payer: Self-pay | Admitting: *Deleted

## 2013-02-17 NOTE — Telephone Encounter (Signed)
Message copied by Britt Bottom on Fri Feb 17, 2013 12:44 PM ------      Message from: Azzie Glatter      Created: Fri Feb 17, 2013 10:57 AM                   ----- Message -----         From: Maryanna Shape, NP         Sent: 02/16/2013   3:53 PM           To: Faylene Kurtz, RN            Call pt. Plt count is stable. Recommend continued observation. ------

## 2013-02-17 NOTE — Telephone Encounter (Signed)
Left msg with instructions.  SLJ

## 2013-02-22 DIAGNOSIS — IMO0002 Reserved for concepts with insufficient information to code with codable children: Secondary | ICD-10-CM | POA: Diagnosis not present

## 2013-02-22 DIAGNOSIS — IMO0001 Reserved for inherently not codable concepts without codable children: Secondary | ICD-10-CM | POA: Diagnosis not present

## 2013-02-22 DIAGNOSIS — M542 Cervicalgia: Secondary | ICD-10-CM | POA: Diagnosis not present

## 2013-02-22 DIAGNOSIS — M5030 Other cervical disc degeneration, unspecified cervical region: Secondary | ICD-10-CM | POA: Diagnosis not present

## 2013-02-22 DIAGNOSIS — M7989 Other specified soft tissue disorders: Secondary | ICD-10-CM | POA: Diagnosis not present

## 2013-02-22 DIAGNOSIS — M549 Dorsalgia, unspecified: Secondary | ICD-10-CM | POA: Diagnosis not present

## 2013-03-16 ENCOUNTER — Ambulatory Visit (HOSPITAL_BASED_OUTPATIENT_CLINIC_OR_DEPARTMENT_OTHER): Payer: Medicare Other

## 2013-03-16 VITALS — BP 125/63 | HR 77 | Temp 98.5°F

## 2013-03-16 DIAGNOSIS — E538 Deficiency of other specified B group vitamins: Secondary | ICD-10-CM | POA: Diagnosis not present

## 2013-03-16 MED ORDER — CYANOCOBALAMIN 1000 MCG/ML IJ SOLN
1000.0000 ug | Freq: Once | INTRAMUSCULAR | Status: AC
  Administered 2013-03-16: 1000 ug via INTRAMUSCULAR

## 2013-03-30 ENCOUNTER — Telehealth (HOSPITAL_COMMUNITY): Payer: Self-pay | Admitting: Adult Health

## 2013-03-30 NOTE — Telephone Encounter (Signed)
Left a message.   ECHO EF stable. No change.    CLEGG,AMY NP-C 4:27 PM

## 2013-04-10 NOTE — Telephone Encounter (Signed)
Pt aware.

## 2013-04-11 DIAGNOSIS — H43819 Vitreous degeneration, unspecified eye: Secondary | ICD-10-CM | POA: Diagnosis not present

## 2013-04-11 DIAGNOSIS — H353190 Nonexudative age-related macular degeneration, unspecified eye, stage unspecified: Secondary | ICD-10-CM | POA: Diagnosis not present

## 2013-04-11 DIAGNOSIS — H18519 Endothelial corneal dystrophy, unspecified eye: Secondary | ICD-10-CM | POA: Diagnosis not present

## 2013-04-11 DIAGNOSIS — Z961 Presence of intraocular lens: Secondary | ICD-10-CM | POA: Diagnosis not present

## 2013-04-11 DIAGNOSIS — Z947 Corneal transplant status: Secondary | ICD-10-CM | POA: Diagnosis not present

## 2013-04-13 ENCOUNTER — Ambulatory Visit (HOSPITAL_BASED_OUTPATIENT_CLINIC_OR_DEPARTMENT_OTHER): Payer: Medicare Other

## 2013-04-13 VITALS — BP 127/71 | HR 103 | Temp 98.4°F

## 2013-04-13 DIAGNOSIS — D510 Vitamin B12 deficiency anemia due to intrinsic factor deficiency: Secondary | ICD-10-CM | POA: Diagnosis not present

## 2013-04-13 DIAGNOSIS — E538 Deficiency of other specified B group vitamins: Secondary | ICD-10-CM

## 2013-04-13 MED ORDER — CYANOCOBALAMIN 1000 MCG/ML IJ SOLN
1000.0000 ug | Freq: Once | INTRAMUSCULAR | Status: AC
  Administered 2013-04-13: 1000 ug via INTRAMUSCULAR

## 2013-05-05 DIAGNOSIS — Z23 Encounter for immunization: Secondary | ICD-10-CM | POA: Diagnosis not present

## 2013-05-17 ENCOUNTER — Other Ambulatory Visit: Payer: Self-pay | Admitting: Hematology and Oncology

## 2013-05-17 ENCOUNTER — Telehealth: Payer: Self-pay | Admitting: Hematology and Oncology

## 2013-05-17 DIAGNOSIS — D696 Thrombocytopenia, unspecified: Secondary | ICD-10-CM

## 2013-05-17 DIAGNOSIS — D510 Vitamin B12 deficiency anemia due to intrinsic factor deficiency: Secondary | ICD-10-CM

## 2013-05-17 NOTE — Telephone Encounter (Signed)
pt aware of appt for tomorrow 11/6

## 2013-05-18 ENCOUNTER — Telehealth: Payer: Self-pay | Admitting: Hematology and Oncology

## 2013-05-18 ENCOUNTER — Encounter (INDEPENDENT_AMBULATORY_CARE_PROVIDER_SITE_OTHER): Payer: Self-pay

## 2013-05-18 ENCOUNTER — Other Ambulatory Visit (HOSPITAL_BASED_OUTPATIENT_CLINIC_OR_DEPARTMENT_OTHER): Payer: Medicare Other | Admitting: Lab

## 2013-05-18 ENCOUNTER — Ambulatory Visit: Payer: Medicare Other

## 2013-05-18 ENCOUNTER — Ambulatory Visit (HOSPITAL_BASED_OUTPATIENT_CLINIC_OR_DEPARTMENT_OTHER): Payer: Medicare Other | Admitting: Hematology and Oncology

## 2013-05-18 VITALS — BP 114/73 | HR 108 | Temp 98.0°F | Resp 19 | Ht 66.0 in | Wt 176.6 lb

## 2013-05-18 DIAGNOSIS — D696 Thrombocytopenia, unspecified: Secondary | ICD-10-CM

## 2013-05-18 DIAGNOSIS — E538 Deficiency of other specified B group vitamins: Secondary | ICD-10-CM

## 2013-05-18 DIAGNOSIS — N189 Chronic kidney disease, unspecified: Secondary | ICD-10-CM

## 2013-05-18 DIAGNOSIS — D510 Vitamin B12 deficiency anemia due to intrinsic factor deficiency: Secondary | ICD-10-CM | POA: Diagnosis not present

## 2013-05-18 LAB — CBC & DIFF AND RETIC
Basophils Absolute: 0 10*3/uL (ref 0.0–0.1)
EOS%: 5.3 % (ref 0.0–7.0)
Eosinophils Absolute: 0.4 10*3/uL (ref 0.0–0.5)
HGB: 12 g/dL (ref 11.6–15.9)
Immature Retic Fract: 4.9 % (ref 1.60–10.00)
MCH: 32.2 pg (ref 25.1–34.0)
MONO%: 6.6 % (ref 0.0–14.0)
NEUT#: 4.1 10*3/uL (ref 1.5–6.5)
RBC: 3.73 10*6/uL (ref 3.70–5.45)
RDW: 13.2 % (ref 11.2–14.5)
Retic %: 1.78 % (ref 0.70–2.10)
Retic Ct Abs: 66.39 10*3/uL (ref 33.70–90.70)
lymph#: 1.6 10*3/uL (ref 0.9–3.3)

## 2013-05-18 LAB — COMPREHENSIVE METABOLIC PANEL (CC13)
ALT: <6 U/L (ref 0–55)
AST: 13 U/L (ref 5–34)
Alkaline Phosphatase: 94 U/L (ref 40–150)
BUN: 41.3 mg/dL — ABNORMAL HIGH (ref 7.0–26.0)
Calcium: 10.1 mg/dL (ref 8.4–10.4)
Chloride: 107 mEq/L (ref 98–109)
Creatinine: 1.7 mg/dL — ABNORMAL HIGH (ref 0.6–1.1)

## 2013-05-18 LAB — MORPHOLOGY: PLT EST: DECREASED

## 2013-05-18 LAB — VITAMIN B12: Vitamin B-12: 915 pg/mL — ABNORMAL HIGH (ref 211–911)

## 2013-05-18 MED ORDER — CYANOCOBALAMIN 1000 MCG/ML IJ SOLN
INTRAMUSCULAR | Status: AC
  Filled 2013-05-18: qty 1

## 2013-05-18 MED ORDER — CYANOCOBALAMIN 1000 MCG/ML IJ SOLN
1000.0000 ug | Freq: Once | INTRAMUSCULAR | Status: AC
  Administered 2013-05-18: 1000 ug via INTRAMUSCULAR

## 2013-05-18 NOTE — Telephone Encounter (Signed)
GV AND PRINTED APPT SCHED AND AVS FOR PT FOR aPRIL

## 2013-05-18 NOTE — Progress Notes (Signed)
[place] OFFICE PROGRESS NOTE  Unice Cobble, MD DIAGNOSIS:  Chronic thrombocytopenia with mild anemia  SUMMARY OF HEMATOLOGIC HISTORY: This is a pleasant 79 year old lady with background history of anemia and thrombocytopenia. She had bone marrow aspirate and biopsy in 2011 which were negative for malignancy INTERVAL HISTORY: [name] 79 y.o. female returns for further followup. She was started on B12 injections for a while for history of B12 deficiency. She was never placed on oral B12 supplements.The patient denies any recent signs or symptoms of bleeding such as spontaneous epistaxis, hematuria or hematochezia. She denies any recent fever, chills, night sweats or abnormal weight loss  I have reviewed the past medical history, past surgical history, social history and family history with the patient and they are unchanged from previous note.  ALLERGIES:  is allergic to oxycodone; penicillins; alendronate sodium; colchicine; norvasc; streptomycin; and morphine and related.  MEDICATIONS:  Current Outpatient Prescriptions  Medication Sig Dispense Refill  . benazepril (LOTENSIN) 20 MG tablet Take 1 tablet (20 mg total) by mouth daily.  30 tablet  11  . beta carotene w/minerals (OCUVITE) tablet Take 1 tablet by mouth daily.       . brimonidine (ALPHAGAN) 0.2 % ophthalmic solution Place 1 drop into the right eye 2 (two) times daily.        . Calcium Carbonate-Vitamin D (CALTRATE 600+D PO) Take by mouth 2 (two) times daily.      . carboxymethylcellulose (REFRESH TEARS) 0.5 % SOLN Place 1 drop into both eyes 2 (two) times daily.       . carvedilol (COREG) 25 MG tablet Take 1 tablet (25 mg total) by mouth 2 (two) times daily with a meal.  180 tablet  1  . Cyanocobalamin 1000 MCG/ML KIT Inject as directed every 30 (thirty) days.      Marland Kitchen docusate sodium (COLACE) 100 MG capsule Take 100 mg by mouth daily. Stool softener      . fluticasone (FLONASE) 50 MCG/ACT nasal spray  Place 1 spray into the nose 2 (two) times daily.       . furosemide (LASIX) 40 MG tablet TAKE 1 TABLET (40 MG TOTAL) BY MOUTH DAILY.  90 tablet  1  . ibuprofen (ADVIL,MOTRIN) 200 MG tablet Take 200 mg by mouth as needed.       . Misc Natural Products (OSTEO BI-FLEX ADV JOINT SHIELD) TABS Take 2 tablets by mouth daily.        Marland Kitchen omeprazole (PRILOSEC) 20 MG capsule Take 20 mg by mouth daily.        . potassium chloride SA (K-DUR,KLOR-CON) 20 MEQ tablet TAKE 1 TABLET EVERY OTHER DAY      . TERIPARATIDE, RECOMBINANT, [place] Inject 20 mcg into the skin daily.      . timolol (BETIMOL) 0.5 % ophthalmic solution Place 1 drop into the right eye 2 (two) times daily.       No current facility-administered medications for this visit.     REVIEW OF SYSTEMS:   Constitutional: Denies fevers, chills or night sweats Eyes: Denies blurriness of vision Ears, nose, mouth, throat, and face: Denies mucositis or sore throat Respiratory: Denies cough, dyspnea or wheezes Cardiovascular: Denies palpitation, chest discomfort or lower extremity swelling Gastrointestinal:  Denies nausea, heartburn or change in bowel habits Skin: Denies abnormal skin rashes Lymphatics: Denies new lymphadenopathy or easy bruising Neurological:Denies numbness, tingling or new weaknesses Behavioral/Psych: Mood is stable, no new changes  All other systems were reviewed with the patient  and are negative.  PHYSICAL EXAMINATION: ECOG PERFORMANCE STATUS: 0 - Asymptomatic  Filed Vitals:   05/18/13 1413  BP: 114/73  Pulse: 108  Temp: 98 F (36.7 C)  Resp: 19   Filed Weights   05/18/13 1413  Weight: 176 lb 9.6 oz (80.105 kg)    GENERAL:alert, no distress and comfortable SKIN: skin color, texture, turgor are normal, no rashes or significant lesions EYES: normal, Conjunctiva are pink and non-injected, sclera clear OROPHARYNX:no exudate, no erythema and lips, buccal mucosa, and tongue normal  NECK: supple, thyroid normal size,  non-tender, without nodularity LYMPH:  no palpable lymphadenopathy in the cervical, axillary or inguinal LUNGS: clear to auscultation and percussion with normal breathing effort HEART: regular rate & rhythm and no murmurs and no lower extremity edema ABDOMEN:abdomen soft, non-tender and normal bowel sounds Musculoskeletal:no cyanosis of digits and no clubbing  NEURO: alert & oriented x 3 with fluent speech, no focal motor/sensory deficits  LABORATORY DATA:  I have reviewed the data as listed Results for orders placed in visit on 05/18/13 (from the past 48 hour(s))  CBC & DIFF AND RETIC     Status: Abnormal   Collection Time    05/18/13  1:46 PM      Result Value Range   WBC 6.6  3.9 - 10.3 10e3/uL   NEUT# 4.1  1.5 - 6.5 10e3/uL   HGB 12.0  11.6 - 15.9 g/dL   HCT 35.0  34.8 - 46.6 %   Platelets 82 (*) 145 - 400 10e3/uL   MCV 93.8  79.5 - 101.0 fL   MCH 32.2  25.1 - 34.0 pg   MCHC 34.3  31.5 - 36.0 g/dL   RBC 3.73  3.70 - 5.45 10e6/uL   RDW 13.2  11.2 - 14.5 %   lymph# 1.6  0.9 - 3.3 10e3/uL   MONO# 0.4  0.1 - 0.9 10e3/uL   Eosinophils Absolute 0.4  0.0 - 0.5 10e3/uL   Basophils Absolute 0.0  0.0 - 0.1 10e3/uL   NEUT% 63.1  38.4 - 76.8 %   LYMPH% 24.7  14.0 - 49.7 %   MONO% 6.6  0.0 - 14.0 %   EOS% 5.3  0.0 - 7.0 %   BASO% 0.3  0.0 - 2.0 %   Retic % 1.78  0.70 - 2.10 %   Retic Ct Abs 66.39  33.70 - 90.70 10e3/uL   Immature Retic Fract 4.90  1.60 - 10.00 %  COMPREHENSIVE METABOLIC PANEL (0000000)     Status: Abnormal   Collection Time    05/18/13  1:46 PM      Result Value Range   Sodium 144  136 - 145 mEq/L   Potassium 4.5  3.5 - 5.1 mEq/L   Chloride 107  98 - 109 mEq/L   CO2 24  22 - 29 mEq/L   Glucose 116  70 - 140 mg/dl   BUN 41.3 (*) 7.0 - 26.0 mg/dL   Creatinine 1.7 (*) 0.6 - 1.1 mg/dL   Total Bilirubin 0.48  0.20 - 1.20 mg/dL   Alkaline Phosphatase 94  40 - 150 U/L   AST 13  5 - 34 U/L   ALT <6  0 - 55 U/L   Total Protein 6.6  6.4 - 8.3 g/dL   Albumin 3.7  3.5 -  5.0 g/dL   Calcium 10.1  8.4 - 10.4 mg/dL   Anion Gap 13 (*) 3 - 11 mEq/L  MORPHOLOGY     Status: None  Collection Time    05/18/13  1:46 PM      Result Value Range   Polychromasia Slight  Slight   Tear Drop Cells Few  Negative   Ovalocytes Few  Negative   Helmet Cell Few  Negative   White Cell Comments C/W auto diff     Other Comments Oc variant lymph     PLT EST Decreased  Adequate    ASSESSMENT:  #1 anemia, resolved #2 history of B12 deficiency #3 thrombocytopenia  PLAN:  #1 anemia, resolved Because of her anemia is unknown, I suspect she may have anemia chronic disease. She is asymptomatic. #2 history of B12 deficiency And recheck a B12 level today. I'm giving her one more B12 injection and then recommend she take over-the-counter oral B12 supplements #3 thrombocytopenia She could have ITP. Her platelet count is improving. We will just observe. She does not require transfusion on medications for this #4 chronic kidney disease She'll continue followup with her primary doctor for this.  All questions were answered. The patient knows to call the clinic with any problems, questions or concerns. No barriers to learning was detected.  I spent 25 minutes counseling the patient face to face. The total time spent in the appointment was 40 minutes and more than 50% was on counseling.     [facility], [place], MD 05/18/2013 2:59 PM

## 2013-05-18 NOTE — Progress Notes (Signed)
B12 injection given to pt by desk nurse Cameo,  RN.

## 2013-06-05 DIAGNOSIS — Z5181 Encounter for therapeutic drug level monitoring: Secondary | ICD-10-CM | POA: Diagnosis not present

## 2013-06-05 DIAGNOSIS — L538 Other specified erythematous conditions: Secondary | ICD-10-CM | POA: Diagnosis not present

## 2013-06-05 DIAGNOSIS — Z79899 Other long term (current) drug therapy: Secondary | ICD-10-CM | POA: Diagnosis not present

## 2013-06-15 ENCOUNTER — Ambulatory Visit: Payer: Medicare Other

## 2013-07-13 ENCOUNTER — Ambulatory Visit: Payer: Medicare Other

## 2013-07-14 ENCOUNTER — Ambulatory Visit: Payer: Medicare Other

## 2013-07-26 ENCOUNTER — Telehealth: Payer: Self-pay

## 2013-07-26 NOTE — Telephone Encounter (Addendum)
Left message for call back. Non-identifiable.  Medication and allergies:  Reviewed and updated  90 day supply/mail order: n/a Local pharmacy:  [facility]   Immunizations due:  Tdap and Shingle-to receive upon appt. As long as insurance covers.     A/P: No changes to personal, family history or past surgical hx Pap-No longer receive CCS- 04/30/06-Osteoporosis MMg-No longer receive  BD- 06/16/12 Flu-05/05/13 PNA-12/14/07   To Discuss with Provider: Not at this time.

## 2013-07-27 ENCOUNTER — Encounter: Payer: Self-pay | Admitting: Internal Medicine

## 2013-07-27 ENCOUNTER — Ambulatory Visit (INDEPENDENT_AMBULATORY_CARE_PROVIDER_SITE_OTHER): Payer: Medicare Other | Admitting: Internal Medicine

## 2013-07-27 VITALS — BP 145/71 | HR 72 | Temp 98.7°F | Ht 62.5 in | Wt 175.6 lb

## 2013-07-27 DIAGNOSIS — R000 Tachycardia, unspecified: Secondary | ICD-10-CM

## 2013-07-27 DIAGNOSIS — N259 Disorder resulting from impaired renal tubular function, unspecified: Secondary | ICD-10-CM

## 2013-07-27 DIAGNOSIS — Z0000 Encounter for general adult medical examination without abnormal findings: Secondary | ICD-10-CM

## 2013-07-27 DIAGNOSIS — I10 Essential (primary) hypertension: Secondary | ICD-10-CM | POA: Diagnosis not present

## 2013-07-27 DIAGNOSIS — I509 Heart failure, unspecified: Secondary | ICD-10-CM

## 2013-07-27 DIAGNOSIS — I5032 Chronic diastolic (congestive) heart failure: Secondary | ICD-10-CM

## 2013-07-27 DIAGNOSIS — R0989 Other specified symptoms and signs involving the circulatory and respiratory systems: Secondary | ICD-10-CM

## 2013-07-27 LAB — BASIC METABOLIC PANEL
BUN: 26 mg/dL — ABNORMAL HIGH (ref 6–23)
CHLORIDE: 104 meq/L (ref 96–112)
CO2: 30 meq/L (ref 19–32)
Calcium: 9.4 mg/dL (ref 8.4–10.5)
Creatinine, Ser: 1.8 mg/dL — ABNORMAL HIGH (ref 0.4–1.2)
GFR: 27.99 mL/min — ABNORMAL LOW (ref >60.00)
GLUCOSE: 104 mg/dL — AB (ref 70–99)
POTASSIUM: 4.3 meq/L (ref 3.5–5.1)
SODIUM: 142 meq/L (ref 135–145)

## 2013-07-27 LAB — BRAIN NATRIURETIC PEPTIDE: Pro B Natriuretic peptide (BNP): 49 pg/mL (ref 0.0–100.0)

## 2013-07-27 LAB — TSH: TSH: 2.25 u[IU]/mL (ref 0.35–5.50)

## 2013-07-27 NOTE — Progress Notes (Signed)
Subjective:    Patient ID: [name], female    DOB: [birth year], 80 y.o.   MRN: [id number]  HPI Medicare Wellness Visit: Psychosocial and medical history were reviewed as required by Medicare (history related to abuse, antisocial behavior , firearm risk). Social history: Caffeine: 1 cup coffee/ day  , Alcohol: no  , Tobacco ERD:EYCXK Exercise:none Personal safety/fall risk:uses cane Limitations of activities of daily living:house cleaning Seatbelt/ smoke alarm use:yes Healthcare Power of Attorney/Living Will status: in place Ophthalmologic exam status:UTD Hearing evaluation status:not current Orientation: Oriented X 3 Memory and recall: good Math testing: good Depression/anxiety assessment: no Foreign travel history:never Immunization status for influenza/pneumonia/ shingles /tetanus: Transfusion history:? Post cx fracture 2013 Preventive health care maintenance status: Colonoscopy/BMD/mammogram/Pap as per protocol/standard care:aged out of colonoscopy; self exams Dental care:every 6 mos Chart reviewed and updated. Active issues reviewed and addressed as documented below.    Review of Systems  Blood pressure  average 140/72 Compliant with anti hypertemsive medication. No lightheadedness or other adverse medication effect described.  Significant headaches, epistaxis, chest pain, palpitations,  claudication, paroxysmal nocturnal dyspnea, or edema absent. Intermittent dyspnea with waist flexion or rapid walking. Edema controlled with diuretic. Creatinine was 1.7 in 11/14.      Objective:   Physical Exam  Gen.: well-nourished in appearance. Alert, appropriate and cooperative throughout exam.Appears younger than stated age  Head: Normocephalic without obvious abnormalities  Eyes: No corneal or conjunctival inflammation noted. Pupils unequal .Extraocular motion intact.  Ears: External  ear exam reveals no significant lesions or deformities. Canals clear .TMs normal. Hearing  is grossly normal bilaterally. Nose: External nasal exam reveals no deformity or inflammation. Nasal mucosa are pink and moist. No lesions or exudates noted.  Mouth: Oral mucosa and oropharynx reveal no lesions or exudates. Teeth in good repair. Neck: No deformities, masses, or tenderness noted. Range of motion good . Thyroid normal.NVD to jaw line Lungs: Normal respiratory effort; chest expands symmetrically. Lungs : minor bibasilar  rales; increased work of breathing. Heart: Rate 108 ;regular rhythm with gallop . Normal S1 and S2. No  click or rub. Slow flow murmur. Abdomen: Bowel sounds normal; abdomen soft and nontender. No masses, organomegaly or hernias noted. Genitalia:  as per Gyn                                  Musculoskeletal/extremities:   Accentuated curvature of upper thoracic spine. No clubbing, cyanosis, edema, or significant extremity  deformity noted. Tone & strength normal. Hand joints normal . Fingernail  health good. Able to lie down & sit up w/o help. Negative SLR bilaterally Vascular: Carotid, radial artery, dorsalis pedis and  posterior tibial pulses are full and equal. No bruits present. Neurologic: Alert and oriented x3. Deep tendon reflexes symmetrical and normal.        Skin: Intact with scattered granuloma annulare  lesions . Lymph: No cervical, axillary lymphadenopathy present. Psych: Mood and affect are normal. Normally interactive  Assessment & Plan:  #1 Medicare Wellness Exam; criteria met ; data entered #2 Problem List/Diagnoses reviewed #3 tachycardia with NVD & rales Plan:  Assessments made/ Orders entered

## 2013-07-27 NOTE — Patient Instructions (Signed)
Your next office appointment will be determined based upon review of your pending labs &  x-rays. Those instructions will be transmitted to you through My Chart  or by mail if you're not using this system.   Order for x-rays entered into  the computer; these will be performed at [facility] @ [address]. No appointment is necessary.

## 2013-07-27 NOTE — Progress Notes (Signed)
Pre visit review using our clinic review tool, if applicable. No additional management support is needed unless otherwise documented below in the visit note. 

## 2013-07-28 ENCOUNTER — Ambulatory Visit (HOSPITAL_BASED_OUTPATIENT_CLINIC_OR_DEPARTMENT_OTHER)
Admission: RE | Admit: 2013-07-28 | Discharge: 2013-07-28 | Disposition: A | Discharge status: Home / Self Care | Payer: Medicare Other | Source: Ambulatory Visit | Attending: Internal Medicine | Admitting: Internal Medicine

## 2013-07-28 DIAGNOSIS — R0989 Other specified symptoms and signs involving the circulatory and respiratory systems: Secondary | ICD-10-CM | POA: Diagnosis not present

## 2013-07-28 DIAGNOSIS — R000 Tachycardia, unspecified: Secondary | ICD-10-CM | POA: Insufficient documentation

## 2013-07-28 DIAGNOSIS — K449 Diaphragmatic hernia without obstruction or gangrene: Secondary | ICD-10-CM | POA: Diagnosis not present

## 2013-07-28 DIAGNOSIS — J984 Other disorders of lung: Secondary | ICD-10-CM | POA: Insufficient documentation

## 2013-07-28 DIAGNOSIS — I5032 Chronic diastolic (congestive) heart failure: Secondary | ICD-10-CM

## 2013-08-10 ENCOUNTER — Other Ambulatory Visit (HOSPITAL_COMMUNITY): Payer: Self-pay | Admitting: Cardiology

## 2013-08-10 DIAGNOSIS — I509 Heart failure, unspecified: Secondary | ICD-10-CM

## 2013-08-14 ENCOUNTER — Ambulatory Visit (HOSPITAL_BASED_OUTPATIENT_CLINIC_OR_DEPARTMENT_OTHER)
Admission: RE | Admit: 2013-08-14 | Discharge: 2013-08-14 | Disposition: A | Discharge status: Home / Self Care | Payer: Medicare Other | Source: Ambulatory Visit | Attending: Internal Medicine | Admitting: Internal Medicine

## 2013-08-14 ENCOUNTER — Ambulatory Visit (HOSPITAL_COMMUNITY)
Admission: RE | Admit: 2013-08-14 | Discharge: 2013-08-14 | Disposition: A | Discharge status: Home / Self Care | Payer: Medicare Other | Source: Ambulatory Visit | Attending: Internal Medicine | Admitting: Internal Medicine

## 2013-08-14 VITALS — BP 102/86 | HR 99 | Wt 175.0 lb

## 2013-08-14 DIAGNOSIS — I10 Essential (primary) hypertension: Secondary | ICD-10-CM | POA: Diagnosis not present

## 2013-08-14 DIAGNOSIS — I5181 Takotsubo syndrome: Secondary | ICD-10-CM

## 2013-08-14 DIAGNOSIS — I252 Old myocardial infarction: Secondary | ICD-10-CM | POA: Insufficient documentation

## 2013-08-14 DIAGNOSIS — I498 Other specified cardiac arrhythmias: Secondary | ICD-10-CM | POA: Diagnosis not present

## 2013-08-14 DIAGNOSIS — N183 Chronic kidney disease, stage 3 unspecified: Secondary | ICD-10-CM | POA: Diagnosis not present

## 2013-08-14 DIAGNOSIS — I509 Heart failure, unspecified: Secondary | ICD-10-CM | POA: Diagnosis not present

## 2013-08-14 DIAGNOSIS — I5032 Chronic diastolic (congestive) heart failure: Secondary | ICD-10-CM | POA: Diagnosis not present

## 2013-08-14 DIAGNOSIS — I491 Atrial premature depolarization: Secondary | ICD-10-CM

## 2013-08-14 DIAGNOSIS — N184 Chronic kidney disease, stage 4 (severe): Secondary | ICD-10-CM | POA: Insufficient documentation

## 2013-08-14 NOTE — Progress Notes (Signed)
Echo Lab  2D Echocardiogram completed.  [place], RDCS 08/14/2013 11:26 AM

## 2013-08-14 NOTE — Patient Instructions (Signed)
Follow up in 6months with Dr.Bensimhon.  

## 2013-08-14 NOTE — Progress Notes (Signed)
Patient ID: [name], female   DOB: 01-03-1923, 80 y.o.   MRN: [id number] PCP: Dr Linna Darner  HPI: [name] is a 80 y.o. 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 LHC in 6/05 in setting of NSTEMI: ant apical AK and inf-apical AK, EF 29%, dOM2 70-80% (small). EF has recovered. Also has a h/o HTN, SVT and fractures pelvis 05/2012. 02/2012  Discharged form [facility] after a fall which resulted in a cervical fracture.   01/2012 ECHO EF 55% 08/14/13 ECHO EF 55%, normal RV size and systolic function.   She returns for follow up. Occasional has chest pain that last for a few seconds but goes away. +Bendopnea. Denies SOB/PND. Admits to mild dyspnea shopping but says it's about the same.  Compliant with medications.   ECG: Ectopic atrial rhythm (abnormal P wave morphology and axis), rate 103, otherwise normal  Labs (1/15): K 4.3, creatinine 1.8, BNP 49  ROS: All systems negative except as listed in HPI, PMH and Problem List.  Past Medical History  Diagnosis Date  . 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 Resolved. --dx'd on cath 2005. EF 29% with minimal distal CAD (small OM1 70-80).  Echo 10/09 EF 55%;  echo 01/26/12: EF 55%, mild LAE, PASP 34.  Marland Kitchen Hypertension     SEVERE  . Idiopathic thrombocytopenic purpura (ITP)   . Granuloma annulare   . History of phlebitis   . Lower extremity edema   . Osteoporosis   . Colitis, ischemic   . Fall     with non-healing rib fractures  . Lumbar stenosis 2004    DR. MARK ROY  . AV block, 1st degree   . Complication of anesthesia     post anesthesia excessive somnulence  . CAD (coronary artery disease)     Non ST elevation 2005 ; LHC in 6/05 in setting of NSTEMI: ant apical AK and inf-apical AK, EF 29%, dOM2 70-80% (small); findings c/w Tako-Tsubo CM  . Compression fracture of C-spine 10/10/2011    Fell at home, tx at [facility]  . Diarrhea associated with pseudomembranous colitis 6/11-17/2013    [facility]   . Bradycardia   . Dyslipidemia   . Multiple pelvic fractures 05/2012    Dr Adaline Sill , [facility]    Current Outpatient Prescriptions  Medication Sig Dispense Refill  . benazepril (LOTENSIN) 20 MG tablet Take 1 tablet (20 mg total) by mouth daily.  30 tablet  11  . beta carotene w/minerals (OCUVITE) tablet Take 1 tablet by mouth daily.       . brimonidine (ALPHAGAN) 0.2 % ophthalmic solution Place 1 drop into the right eye 2 (two) times daily.        . Calcium Carbonate-Vitamin D (CALTRATE 600+D PO) Take by mouth 2 (two) times daily.      . carboxymethylcellulose (REFRESH TEARS) 0.5 % SOLN Place 1 drop into both eyes 2 (two) times daily.       . carvedilol (COREG) 25 MG tablet Take 1 tablet (25 mg total) by mouth 2 (two) times daily with a meal.  180 tablet  1  . Cyanocobalamin 1000 MCG/ML KIT Take 1,000 mcg by mouth daily.       . diphenhydrAMINE (BENADRYL) 25 MG tablet Take 25 mg by mouth at bedtime.      . docusate sodium (COLACE) 100 MG capsule Take 100 mg by mouth daily. Stool softener      . fluticasone (FLONASE) 50 MCG/ACT nasal spray  Place 1 spray into the nose 2 (two) times daily.       . furosemide (LASIX) 40 MG tablet TAKE 1 TABLET (40 MG TOTAL) BY MOUTH DAILY.  90 tablet  1  . ibuprofen (ADVIL,MOTRIN) 200 MG tablet Take 200 mg by mouth as needed.       . Misc Natural Products (OSTEO BI-FLEX ADV JOINT SHIELD) TABS Take 2 tablets by mouth daily.        Marland Kitchen omeprazole (PRILOSEC) 20 MG capsule Take 20 mg by mouth daily.        . potassium chloride SA (K-DUR,KLOR-CON) 20 MEQ tablet TAKE 1 TABLET EVERY OTHER DAY      . TERIPARATIDE, RECOMBINANT, [place] Inject 20 mcg into the skin daily.      . timolol (BETIMOL) 0.5 % ophthalmic solution Place 1 drop into the right eye 2 (two) times daily.       No current facility-administered medications for this encounter.     PHYSICAL EXAM: There were no vitals filed for this visit.  General:  Elderly Well appearing. No resp difficulty HEENT:  normal Neck: supple. JVP 8 cm. Carotids 2+ bilaterally; no bruits. No lymphadenopathy or thyromegaly appreciated. Cor: PMI normal. Regular rate & rhythm. No rubs, gallops or murmurs. Lungs: clear Abdomen: soft, nontender, nondistended. No hepatosplenomegaly. No bruits or masses. Good bowel sounds. Extremities: no cyanosis, clubbing, rash, no edema. Neuro: alert & orientedx3, cranial nerves grossly intact. Moves all 4 extremities w/o difficulty. Affect pleasant.    ASSESSMENT & PLAN: 1. Chronic diastolic CHF: History of Takotsubo cardiomyopathy, EF has recovered.  I reviewed her echo today, EF is 55-60% with normal RV.  Probably mild volume overload on exam but stable symptomatically.  BNP not elevated in 1/15.  I will have her continue Lasix 40 mg daily as well as Coreg and benazepril at current doses.  2. CKD: Stable with creatinine 1.8.  3. CAD: No ischemic symptoms.  Nonobstructive CAD on prior cath. She is not on a statin.  Probably would not be much benefit to starting one at this point.  4. Ectopic atrial rhythm: Noted on ECG today, rate mildly elevated.  This does not appear to be a rhythm that would benefit from anticoagulation.  Will continue Coreg for rate control and follow.    Loralie Champagne 08/14/2013

## 2013-08-15 NOTE — Addendum Note (Signed)
Encounter addended by: Evalee Mutton, CCT on: 08/15/2013  9:09 AM<BR>     Documentation filed: Charges VN

## 2013-08-16 ENCOUNTER — Telehealth: Payer: Self-pay | Admitting: Internal Medicine

## 2013-08-16 DIAGNOSIS — Z87311 Personal history of (healed) other pathological fracture: Secondary | ICD-10-CM | POA: Diagnosis not present

## 2013-08-16 DIAGNOSIS — Z8781 Personal history of (healed) traumatic fracture: Secondary | ICD-10-CM | POA: Diagnosis not present

## 2013-08-16 DIAGNOSIS — K219 Gastro-esophageal reflux disease without esophagitis: Secondary | ICD-10-CM | POA: Diagnosis not present

## 2013-08-16 DIAGNOSIS — H547 Unspecified visual loss: Secondary | ICD-10-CM | POA: Diagnosis not present

## 2013-08-16 DIAGNOSIS — M810 Age-related osteoporosis without current pathological fracture: Secondary | ICD-10-CM | POA: Diagnosis not present

## 2013-08-16 NOTE — Telephone Encounter (Signed)
Relevant patient education mailed to patient.  

## 2013-08-17 ENCOUNTER — Ambulatory Visit: Payer: Medicare Other | Admitting: Oncology

## 2013-08-17 ENCOUNTER — Other Ambulatory Visit: Payer: Medicare Other | Admitting: Lab

## 2013-08-17 ENCOUNTER — Ambulatory Visit: Payer: Medicare Other

## 2013-09-04 DIAGNOSIS — I252 Old myocardial infarction: Secondary | ICD-10-CM | POA: Diagnosis not present

## 2013-09-04 DIAGNOSIS — Z48810 Encounter for surgical aftercare following surgery on the sense organs: Secondary | ICD-10-CM | POA: Diagnosis not present

## 2013-09-04 DIAGNOSIS — H2640 Unspecified secondary cataract: Secondary | ICD-10-CM | POA: Diagnosis not present

## 2013-09-04 DIAGNOSIS — H353190 Nonexudative age-related macular degeneration, unspecified eye, stage unspecified: Secondary | ICD-10-CM | POA: Diagnosis not present

## 2013-09-04 DIAGNOSIS — H579 Unspecified disorder of eye and adnexa: Secondary | ICD-10-CM | POA: Diagnosis not present

## 2013-09-04 DIAGNOSIS — H43819 Vitreous degeneration, unspecified eye: Secondary | ICD-10-CM | POA: Diagnosis not present

## 2013-09-04 DIAGNOSIS — Z8739 Personal history of other diseases of the musculoskeletal system and connective tissue: Secondary | ICD-10-CM | POA: Diagnosis not present

## 2013-09-04 DIAGNOSIS — Z48298 Encounter for aftercare following other organ transplant: Secondary | ICD-10-CM | POA: Diagnosis not present

## 2013-09-04 DIAGNOSIS — Z9889 Other specified postprocedural states: Secondary | ICD-10-CM | POA: Diagnosis not present

## 2013-09-04 DIAGNOSIS — Z947 Corneal transplant status: Secondary | ICD-10-CM | POA: Diagnosis not present

## 2013-09-04 DIAGNOSIS — Z83518 Family history of other specified eye disorder: Secondary | ICD-10-CM | POA: Diagnosis not present

## 2013-09-04 DIAGNOSIS — Z8669 Personal history of other diseases of the nervous system and sense organs: Secondary | ICD-10-CM | POA: Diagnosis not present

## 2013-09-06 ENCOUNTER — Other Ambulatory Visit (HOSPITAL_COMMUNITY): Payer: Self-pay | Admitting: Cardiology

## 2013-09-06 DIAGNOSIS — I428 Other cardiomyopathies: Secondary | ICD-10-CM

## 2013-09-06 MED ORDER — BENAZEPRIL HCL 20 MG PO TABS: 20.0000 mg | ORAL_TABLET | Freq: Every day | ORAL | Status: DC

## 2013-10-05 ENCOUNTER — Telehealth: Payer: Self-pay | Admitting: Hematology and Oncology

## 2013-10-05 NOTE — Telephone Encounter (Signed)
cld pt n/a-cld son and gave message that Dr appt had been chgd  per NGon 4/23 to 10:30 lab Dr & 11-son understood and states he will give mother the message

## 2013-10-25 ENCOUNTER — Other Ambulatory Visit: Payer: Self-pay

## 2013-10-25 DIAGNOSIS — I10 Essential (primary) hypertension: Secondary | ICD-10-CM

## 2013-10-25 MED ORDER — CARVEDILOL 25 MG PO TABS: 25.0000 mg | ORAL_TABLET | Freq: Two times a day (BID) | ORAL | Status: DC

## 2013-11-02 ENCOUNTER — Other Ambulatory Visit (HOSPITAL_BASED_OUTPATIENT_CLINIC_OR_DEPARTMENT_OTHER): Payer: Medicare Other

## 2013-11-02 ENCOUNTER — Encounter: Payer: Self-pay | Admitting: Hematology and Oncology

## 2013-11-02 ENCOUNTER — Ambulatory Visit (HOSPITAL_BASED_OUTPATIENT_CLINIC_OR_DEPARTMENT_OTHER): Payer: Medicare Other | Admitting: Hematology and Oncology

## 2013-11-02 ENCOUNTER — Ambulatory Visit: Payer: Medicare Other

## 2013-11-02 ENCOUNTER — Telehealth: Payer: Self-pay | Admitting: Hematology and Oncology

## 2013-11-02 VITALS — BP 169/73 | HR 77 | Temp 98.3°F | Resp 18 | Ht 62.5 in | Wt 179.5 lb

## 2013-11-02 DIAGNOSIS — N189 Chronic kidney disease, unspecified: Secondary | ICD-10-CM

## 2013-11-02 DIAGNOSIS — E538 Deficiency of other specified B group vitamins: Secondary | ICD-10-CM | POA: Diagnosis not present

## 2013-11-02 DIAGNOSIS — D510 Vitamin B12 deficiency anemia due to intrinsic factor deficiency: Secondary | ICD-10-CM

## 2013-11-02 DIAGNOSIS — D696 Thrombocytopenia, unspecified: Secondary | ICD-10-CM | POA: Diagnosis not present

## 2013-11-02 DIAGNOSIS — D693 Immune thrombocytopenic purpura: Secondary | ICD-10-CM

## 2013-11-02 LAB — CBC & DIFF AND RETIC
BASO%: 0.6 % (ref 0.0–2.0)
Basophils Absolute: 0 10*3/uL (ref 0.0–0.1)
EOS ABS: 0.4 10*3/uL (ref 0.0–0.5)
EOS%: 6.2 % (ref 0.0–7.0)
HEMATOCRIT: 36 % (ref 34.8–46.6)
HGB: 12.4 g/dL (ref 11.6–15.9)
Immature Retic Fract: 2.9 % (ref 1.60–10.00)
LYMPH%: 21.4 % (ref 14.0–49.7)
MCH: 31.9 pg (ref 25.1–34.0)
MCHC: 34.4 g/dL (ref 31.5–36.0)
MCV: 92.5 fL (ref 79.5–101.0)
MONO#: 0.6 10*3/uL (ref 0.1–0.9)
MONO%: 9.1 % (ref 0.0–14.0)
NEUT%: 62.7 % (ref 38.4–76.8)
NEUTROS ABS: 4.4 10*3/uL (ref 1.5–6.5)
PLATELETS: 71 10*3/uL — AB (ref 145–400)
RBC: 3.89 10*6/uL (ref 3.70–5.45)
RDW: 12.9 % (ref 11.2–14.5)
RETIC %: 1.3 % (ref 0.70–2.10)
Retic Ct Abs: 50.57 10*3/uL (ref 33.70–90.70)
WBC: 7 10*3/uL (ref 3.9–10.3)
lymph#: 1.5 10*3/uL (ref 0.9–3.3)

## 2013-11-02 LAB — MORPHOLOGY: PLT EST: DECREASED

## 2013-11-02 LAB — COMPREHENSIVE METABOLIC PANEL (CC13)
ALBUMIN: 3.8 g/dL (ref 3.5–5.0)
ALK PHOS: 86 U/L (ref 40–150)
ALT: 6 U/L (ref 0–55)
AST: 15 U/L (ref 5–34)
Anion Gap: 10 mEq/L (ref 3–11)
BUN: 31.7 mg/dL — ABNORMAL HIGH (ref 7.0–26.0)
CO2: 23 mEq/L (ref 22–29)
Calcium: 9.7 mg/dL (ref 8.4–10.4)
Chloride: 109 mEq/L (ref 98–109)
Creatinine: 1.6 mg/dL — ABNORMAL HIGH (ref 0.6–1.1)
GLUCOSE: 88 mg/dL (ref 70–140)
POTASSIUM: 4.3 meq/L (ref 3.5–5.1)
SODIUM: 143 meq/L (ref 136–145)
TOTAL PROTEIN: 6.5 g/dL (ref 6.4–8.3)
Total Bilirubin: 0.48 mg/dL (ref 0.20–1.20)

## 2013-11-02 LAB — VITAMIN B12: VITAMIN B 12: 1899 pg/mL — AB (ref 211–911)

## 2013-11-02 NOTE — Progress Notes (Signed)
[place] OFFICE PROGRESS NOTE  Unice Cobble, MD DIAGNOSIS:  Chronic thrombocytopenia and vitamin B12 deficiency  SUMMARY OF HEMATOLOGIC HISTORY: This is a pleasant lady with background history of anemia and thrombocytopenia. She had bone marrow aspirate and biopsy in 2011 which were negative for malignancy. The thrombocytopenia it is thought to be related to ITP. The anemia resolved with vitamin B12 replacement. INTERVAL HISTORY: [name] 80 y.o. female returns for further followup. She has no symptoms of anemia.. She has easy bruising. The patient denies any recent signs or symptoms of bleeding such as spontaneous epistaxis, hematuria or hematochezia.  I have reviewed the past medical history, past surgical history, social history and family history with the patient and they are unchanged from previous note.  ALLERGIES:  is allergic to oxycodone; penicillins; alendronate sodium; colchicine; norvasc; streptomycin; and morphine and related.  MEDICATIONS:  Current Outpatient Prescriptions  Medication Sig Dispense Refill  . benazepril (LOTENSIN) 20 MG tablet Take 1 tablet (20 mg total) by mouth daily.  30 tablet  11  . beta carotene w/minerals (OCUVITE) tablet Take 1 tablet by mouth daily.       . brimonidine (ALPHAGAN) 0.2 % ophthalmic solution Place 1 drop into the right eye 2 (two) times daily.        . Calcium Carbonate-Vitamin D (CALTRATE 600+D PO) Take by mouth 2 (two) times daily.      . carboxymethylcellulose (REFRESH TEARS) 0.5 % SOLN Place 1 drop into both eyes 2 (two) times daily.       . carvedilol (COREG) 25 MG tablet Take 1 tablet (25 mg total) by mouth 2 (two) times daily with a meal.  180 tablet  1  . Cyanocobalamin 1000 MCG/ML KIT Take 1,000 mcg by mouth daily.       . diphenhydrAMINE (BENADRYL) 25 MG tablet Take 25 mg by mouth at bedtime.      . docusate sodium (COLACE) 100 MG capsule Take 100 mg by mouth daily. Stool softener      .  fluticasone (FLONASE) 50 MCG/ACT nasal spray Place 1 spray into the nose 2 (two) times daily.       . furosemide (LASIX) 40 MG tablet TAKE 1 TABLET (40 MG TOTAL) BY MOUTH DAILY.  90 tablet  1  . ibuprofen (ADVIL,MOTRIN) 200 MG tablet Take 200 mg by mouth as needed.       . Misc Natural Products (OSTEO BI-FLEX ADV JOINT SHIELD) TABS Take 2 tablets by mouth daily.        Marland Kitchen omeprazole (PRILOSEC) 20 MG capsule Take 20 mg by mouth daily.        . TERIPARATIDE, RECOMBINANT,  Inject 20 mcg into the skin daily.      . timolol (BETIMOL) 0.5 % ophthalmic solution Place 1 drop into the right eye 2 (two) times daily.       No current facility-administered medications for this visit.     REVIEW OF SYSTEMS:   Constitutional: Denies fevers, chills or night sweats Eyes: Denies blurriness of vision Ears, nose, mouth, throat, and face: Denies mucositis or sore throat Respiratory: Denies cough, dyspnea or wheezes Cardiovascular: Denies palpitation, chest discomfort or lower extremity swelling Gastrointestinal:  Denies nausea, heartburn or change in bowel habits Skin: Denies abnormal skin rashes Lymphatics: Denies new lymphadenopathy or easy bruising Neurological:Denies numbness, tingling or new weaknesses Behavioral/Psych: Mood is stable, no new changes  All other systems were reviewed with the patient and are negative.  PHYSICAL EXAMINATION: ECOG  PERFORMANCE STATUS: 0 - Asymptomatic  Filed Vitals:   11/02/13 1043  BP: 169/73  Pulse: 77  Temp: 98.3 F (36.8 C)  Resp: 18   Filed Weights   11/02/13 1043  Weight: 179 lb 8 oz (81.421 kg)    GENERAL:alert, no distress and comfortable SKIN: skin color, texture, turgor are normal, no rashes or significant lesions EYES: normal, Conjunctiva are pink and non-injected, sclera clear Musculoskeletal:no cyanosis of digits and no clubbing  NEURO: alert & oriented x 3 with fluent speech, no focal motor/sensory deficits  LABORATORY DATA:  I have  reviewed the data as listed Results for orders placed in visit on 11/02/13 (from the past 48 hour(s))  CBC & DIFF AND RETIC     Status: Abnormal   Collection Time    11/02/13 10:32 AM      Result Value Ref Range   WBC 7.0  3.9 - 10.3 10e3/uL   NEUT# 4.4  1.5 - 6.5 10e3/uL   HGB 12.4  11.6 - 15.9 g/dL   HCT 36.0  34.8 - 46.6 %   Platelets 71 (*) 145 - 400 10e3/uL   MCV 92.5  79.5 - 101.0 fL   MCH 31.9  25.1 - 34.0 pg   MCHC 34.4  31.5 - 36.0 g/dL   RBC 3.89  3.70 - 5.45 10e6/uL   RDW 12.9  11.2 - 14.5 %   lymph# 1.5  0.9 - 3.3 10e3/uL   MONO# 0.6  0.1 - 0.9 10e3/uL   Eosinophils Absolute 0.4  0.0 - 0.5 10e3/uL   Basophils Absolute 0.0  0.0 - 0.1 10e3/uL   NEUT% 62.7  38.4 - 76.8 %   LYMPH% 21.4  14.0 - 49.7 %   MONO% 9.1  0.0 - 14.0 %   EOS% 6.2  0.0 - 7.0 %   BASO% 0.6  0.0 - 2.0 %   Retic % 1.30  0.70 - 2.10 %   Retic Ct Abs 50.57  33.70 - 90.70 10e3/uL   Immature Retic Fract 2.90  1.60 - 10.00 %  MORPHOLOGY     Status: None   Collection Time    11/02/13 10:32 AM      Result Value Ref Range   Tear Drop Cells Few  Negative   Ovalocytes Few  Negative   Helmet Cell rare  Negative   White Cell Comments few Variant Lymphs     PLT EST Decreased  Adequate  COMPREHENSIVE METABOLIC PANEL (OJ50)     Status: Abnormal   Collection Time    11/02/13 10:32 AM      Result Value Ref Range   Sodium 143  136 - 145 mEq/L   Potassium 4.3  3.5 - 5.1 mEq/L   Chloride 109  98 - 109 mEq/L   CO2 23  22 - 29 mEq/L   Glucose 88  70 - 140 mg/dl   BUN 31.7 (*) 7.0 - 26.0 mg/dL   Creatinine 1.6 (*) 0.6 - 1.1 mg/dL   Total Bilirubin 0.48  0.20 - 1.20 mg/dL   Alkaline Phosphatase 86  40 - 150 U/L   AST 15  5 - 34 U/L   ALT 6  0 - 55 U/L   Total Protein 6.5  6.4 - 8.3 g/dL   Albumin 3.8  3.5 - 5.0 g/dL   Calcium 9.7  8.4 - 10.4 mg/dL   Anion Gap 10  3 - 11 mEq/L    Lab Results  Component Value Date   WBC  7.0 11/02/2013   HGB 12.4 11/02/2013   HCT 36.0 11/02/2013   MCV 92.5 11/02/2013   PLT  71* 11/02/2013   ASSESSMENT & PLAN:  #1 anemia, resolved Because of her anemia is unknown, I suspect she may have anemia chronic disease. She is asymptomatic. #2 Vitamin B12 deficiency She is taking  over-the-counter oral B12 supplements. I will recheck her vitamin B12 in her next visit and we will give her injection if the over the counter replacement is not adequate. #3 thrombocytopenia She could have ITP. Her platelet count is stable. We will just observe. She does not require transfusion on medications for this #4 chronic kidney disease She'll continue followup with her primary doctor for this. All questions were answered. The patient knows to call the clinic with any problems, questions or concerns. No barriers to learning was detected.  I spent 15 minutes counseling the patient face to face. The total time spent in the appointment was 20 minutes and more than 50% was on counseling.     Heath Lark, MD 11/02/2013 10:29 PM

## 2013-11-02 NOTE — Telephone Encounter (Signed)
Mailed the pt per April 2016 appt calendar

## 2013-11-03 ENCOUNTER — Telehealth: Payer: Self-pay | Admitting: Hematology and Oncology

## 2013-11-03 NOTE — Telephone Encounter (Signed)
lvm for pt regarding to April 2016 appt....mailed pt appt sched/avs and letter °

## 2013-11-06 ENCOUNTER — Telehealth (HOSPITAL_COMMUNITY): Payer: Self-pay | Admitting: Cardiology

## 2013-11-06 DIAGNOSIS — I428 Other cardiomyopathies: Secondary | ICD-10-CM

## 2013-11-06 MED ORDER — BENAZEPRIL HCL 20 MG PO TABS: 20.0000 mg | ORAL_TABLET | Freq: Every day | ORAL | Status: DC

## 2013-11-06 NOTE — Telephone Encounter (Signed)
Pt called to request meds be sent to [facility] as no co pay applies for this pharmacy

## 2013-11-07 ENCOUNTER — Other Ambulatory Visit: Payer: Self-pay | Admitting: *Deleted

## 2013-11-07 DIAGNOSIS — I10 Essential (primary) hypertension: Secondary | ICD-10-CM

## 2013-11-07 MED ORDER — CARVEDILOL 25 MG PO TABS: 25.0000 mg | ORAL_TABLET | Freq: Two times a day (BID) | ORAL | Status: DC

## 2013-11-07 MED ORDER — FUROSEMIDE 40 MG PO TABS: ORAL_TABLET | ORAL | Status: DC

## 2013-11-21 ENCOUNTER — Telehealth: Payer: Self-pay | Admitting: Internal Medicine

## 2013-11-21 NOTE — Telephone Encounter (Signed)
Patient states for the last few weeks, she has had rectal pain. She reports she has to take several laxatives to have a bowel movement then the bowel movement is painful. Also, reports her stomach hurts when she eats. Scheduled with Nicoletta Ba, PA on 11/22/13 at 2:00 PM.

## 2013-11-22 ENCOUNTER — Ambulatory Visit (INDEPENDENT_AMBULATORY_CARE_PROVIDER_SITE_OTHER): Payer: Medicare Other | Admitting: Physician Assistant

## 2013-11-22 ENCOUNTER — Encounter: Payer: Self-pay | Admitting: Physician Assistant

## 2013-11-22 VITALS — BP 130/88 | HR 100 | Ht 62.5 in | Wt 178.4 lb

## 2013-11-22 DIAGNOSIS — K5900 Constipation, unspecified: Secondary | ICD-10-CM

## 2013-11-22 DIAGNOSIS — K602 Anal fissure, unspecified: Secondary | ICD-10-CM | POA: Diagnosis not present

## 2013-11-22 DIAGNOSIS — R1032 Left lower quadrant pain: Secondary | ICD-10-CM | POA: Diagnosis not present

## 2013-11-22 NOTE — Patient Instructions (Signed)
We have given you samples of ANA-LEX, a Hydrocortisone Acetate 2% Lidocaine HCL 2%.  Use 3-4 times daily. Use 2 weeks then as needed.   After the 2 weeks if you want more we can send a prescription. Call us at [phone].   Take Milk of Magnesia every other days for bowels.   You have been scheduled for a CT scan of the abdomen and pelvis at [facility] ([address]---this is in the same building as [facility]).   You are scheduled on Friday 5-15 at 3:00 PM . You should arrive  At 2:45 PM  prior to your appointment time for registration. Please follow the written instructions below on the day of your exam:  WARNING: IF YOU ARE ALLERGIC TO IODINE/X-RAY DYE, PLEASE NOTIFY RADIOLOGY IMMEDIATELY AT [phone]! YOU WILL BE GIVEN A 13 HOUR PREMEDICATION PREP.  1) Do not eat or drink anything after 11:00 am  (4 hours prior to your test) 2) You have been given 2 bottles of oral contrast to drink. The solution may taste better if refrigerated, but do NOT add ice or any other liquid to this solution. Shake  well before drinking.    Drink 1 bottle of contrast @ 1:00 PM  (2 hours prior to your exam)  Drink 1 bottle of contrast @ 2:00 PM  (1 hour prior to your exam)  You may take any medications as prescribed with a small amount of water except for the following: Metformin, Glucophage, Glucovance, Avandamet, Riomet, Fortamet, Actoplus Met, Janumet, Glumetza or Metaglip. The above medications must be held the day of the exam AND 48 hours after the exam.  The purpose of you drinking the oral contrast is to aid in the visualization of your intestinal tract. The contrast solution may cause some diarrhea. Before your exam is started, you will be given a small amount of fluid to drink. Depending on your individual set of symptoms, you may also receive an intravenous injection of x-ray contrast/dye. Plan on being at [facility] for 30 minutes or long, depending on the type of exam you  are having performed.  If you have any questions regarding your exam or if you need to reschedule, you may call the CT department at [phone] between the hours of 8:00 am and 5:00 pm, Monday-Friday.  ________________________________________________________________________   

## 2013-11-23 ENCOUNTER — Encounter: Payer: Self-pay | Admitting: Physician Assistant

## 2013-11-23 NOTE — Progress Notes (Signed)
Subjective:    Patient ID: [name], female    DOB: 10/17/1922, 80 y.o.   MRN: [id number]  HPI [name] is a pleasant 80 year old white female known to Dr. Henrene Pastor who has not been seen in several years. She had undergone a colonoscopy in March of 2007 was found to have diverticulosis and a diminutive rectal polyp which was removed. Path was consistent with a tubular adenoma. She also had EGD in March of 2007 finding of an 8 cm hiatal hernia and an esophageal stricture. Patient had an episode of ischemic colitis in October 2007 and had undergone a flexible sigmoidoscopy at that time. Other medical problems include congestive heart failure, cardiomyopathy, hypertension, and history of granuloma annulare or. She also carries a diagnosis of the ITP and has had thrombocytopenia for many years. Patient comes in today because of new onset of constipation. She has had symptoms over the past 3 weeks and is taking a laxative every 3-4 days usually milk of magnesia to produce a bowel movement. She says is quite unusual for her to be constipated. She also has been having rectal pain over the past couple weeks. She does not recall any particular episodes of very hard stools or straining but says that she's having pain with defecation and has pain for a few minutes after a bowel movement. The pain seems to be internal. She has not had any bleeding no fever or chills. She has had some minimal abdominal discomfort which she attributes to constipation. Her appetite is fine and she's been eating without difficulty.   Review of Systems  Constitutional: Negative.   HENT: Negative.   Eyes: Negative.   Respiratory: Negative.   Gastrointestinal: Positive for constipation and anal bleeding.  Endocrine: Negative.   Genitourinary: Negative.   Musculoskeletal: Negative.   Skin: Positive for rash.  Allergic/Immunologic: Negative.   Neurological: Negative.   Hematological: Negative.   Psychiatric/Behavioral:  Negative.    Outpatient Prescriptions Prior to Visit  Medication Sig Dispense Refill  . benazepril (LOTENSIN) 20 MG tablet Take 1 tablet (20 mg total) by mouth daily.  90 tablet  3  . beta carotene w/minerals (OCUVITE) tablet Take 1 tablet by mouth daily.       . brimonidine (ALPHAGAN) 0.2 % ophthalmic solution Place 1 drop into the right eye 2 (two) times daily.        . Calcium Carbonate-Vitamin D (CALTRATE 600+D PO) Take by mouth 2 (two) times daily.      . carboxymethylcellulose (REFRESH TEARS) 0.5 % SOLN Place 1 drop into both eyes 2 (two) times daily.       . carvedilol (COREG) 25 MG tablet Take 1 tablet (25 mg total) by mouth 2 (two) times daily with a meal.  180 tablet  1  . Cyanocobalamin 1000 MCG/ML KIT Take 1,000 mcg by mouth daily.       . diphenhydrAMINE (BENADRYL) 25 MG tablet Take 25 mg by mouth at bedtime.      . docusate sodium (COLACE) 100 MG capsule Take 100 mg by mouth daily. Stool softener      . fluticasone (FLONASE) 50 MCG/ACT nasal spray Place 1 spray into the nose 2 (two) times daily.       . furosemide (LASIX) 40 MG tablet TAKE 1 TABLET (40 MG TOTAL) BY MOUTH DAILY.  90 tablet  1  . ibuprofen (ADVIL,MOTRIN) 200 MG tablet Take 200 mg by mouth as needed.       . Misc Natural Products (OSTEO  BI-FLEX ADV JOINT SHIELD) TABS Take 2 tablets by mouth daily.        Marland Kitchen omeprazole (PRILOSEC) 20 MG capsule Take 20 mg by mouth daily.        . TERIPARATIDE, RECOMBINANT, [place] Inject 20 mcg into the skin daily.      . timolol (BETIMOL) 0.5 % ophthalmic solution Place 1 drop into the right eye 2 (two) times daily.       No facility-administered medications prior to visit.   Allergies  Allergen Reactions  . Oxycodone     Dizziness   . Penicillins     REACTION: joint edema  . Alendronate Sodium   . Colchicine     REACTION: hair loss  . Norvasc [Amlodipine Besylate]   . Streptomycin   . Morphine And Related     nausea   Patient Active Problem List   Diagnosis Date Noted  .  CKD (chronic kidney disease) stage 3, GFR 30-59 ml/min 08/14/2013  . Ectopic atrial rhythm 08/14/2013  . Acute renal failure 04/11/2012  . Diastolic CHF, chronic 81/27/5170  . Respiratory failure 12/22/2011  . Fractures Involving Multiple Body Regions 12/22/2011  . ITP (idiopathic thrombocytopenic purpura) 09/25/2011  . Edema   . SYNCOPE 07/19/2010  . RENAL INSUFFICIENCY 11/14/2009  . ARTHRALGIA 11/14/2009  . CARDIOMYOPATHY, PRIMARY, DILATED 10/07/2009  . URINARY INCONTINENCE 03/11/2009  . ANEMIA, PERNICIOUS 05/22/2008  . TAKOTSUBO SYNDROME 05/22/2008  . SUPRAVENTRICULAR TACHYCARDIA, PAROXYSMAL, HX OF 05/22/2008  . POPLITEAL CYST 11/07/2007  . HYPERTENSION 09/19/2007  . PHLEBITIS 09/19/2007  . ISCHEMIC COLITIS, HX OF 09/19/2007  . 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 OSTEOPOROSIS 08/19/2006  . COLONIC POLYPS, HX OF 08/19/2006   History  Substance Use Topics  . Smoking status: Never Smoker   . Smokeless tobacco: Never Used  . Alcohol Use: No      family history includes Breast cancer in her sister and sister; Colon cancer in her father; Coronary artery disease in her mother; Heart failure in her mother; Kidney disease in her mother; Rectal cancer in her sister; Renal cancer in her mother; Stroke in her father. There is no history of Diabetes.  Objective:   Physical Exam    well-developed elderly white female in no acute distress, pleasant blood pressure 130/88 pulse 100 height 5 foot 2 weight 178. HEENT ;nontraumatic normocephalic EOMI PERRLA sclera anicteric, Supple; no JVD, Cardiovascular; regular rate and rhythm with S1-S2 no murmur or gallop, Pulmonary; clear bilaterally, Abdomen ;soft nondistended bowel sounds are active she is very minimally tender in the left lower quadrant is no guarding or rebound no palpable mass or hepatosplenomegaly, Rectal ;exam no external lesions noted she is exquisitely tender on digital exam in the anal sphincter more so on the left side cannot  definitely palpate a fissure anoscopy is not done due to patient discomfort, Ext; no clubbing cyanosis or edema skin warm and dry, Psych; mood and affect appropriate        Assessment & Plan:  #70  80 year old female with 3 week history of new onset constipation-etiology not clear with no history of chronic constipation and no recent changes in diet meds etc. Will rule out mild diverticulitis or occult lesion #2 acute rectal pain x2 weeks-exam consistent with an anal fissure #3 history of diverticulosis #4 history of adenomatous colon polyps last colonoscopy 2007 #5 history of cardiomyopathy #6 congestive heart failure #7 chronic thrombocytopenia/ITP #8 history of ischemic colitis 2007 #9 chronic renal insufficiency #9 chronic B12 deficiency /pernicious anemia  Plan;  schedule for CT scan of the abdomen and pelvis no IV contrast Encourage patient to take milk of magnesia on an every other day basis, or start MiraLax 17 g in 8 ounces of water daily She was provided with several samples of Analex- hydrocortisone/lidocaine Cream to use 3-4 times daily for the anal fissure. We discussed nature of slow healing with fissures and she's encouraged the patient over the next several weeks. If she fails to have any improvement over the next couple weeks may add Cardizem gel.

## 2013-11-23 NOTE — Progress Notes (Signed)
Agree with initial assessment and plans 

## 2013-11-24 ENCOUNTER — Encounter: Payer: Self-pay | Admitting: Internal Medicine

## 2013-11-24 ENCOUNTER — Inpatient Hospital Stay: Admission: RE | Admit: 2013-11-24 | Payer: Medicare Other | Source: Ambulatory Visit

## 2013-11-29 ENCOUNTER — Ambulatory Visit (INDEPENDENT_AMBULATORY_CARE_PROVIDER_SITE_OTHER)
Admission: RE | Admit: 2013-11-29 | Discharge: 2013-11-29 | Disposition: A | Discharge status: Home / Self Care | Payer: Medicare Other | Source: Ambulatory Visit | Attending: Physician Assistant | Admitting: Physician Assistant

## 2013-11-29 DIAGNOSIS — K5900 Constipation, unspecified: Secondary | ICD-10-CM

## 2013-11-29 DIAGNOSIS — N281 Cyst of kidney, acquired: Secondary | ICD-10-CM | POA: Diagnosis not present

## 2013-11-29 DIAGNOSIS — R1032 Left lower quadrant pain: Secondary | ICD-10-CM

## 2013-12-01 ENCOUNTER — Other Ambulatory Visit: Payer: Self-pay | Admitting: *Deleted

## 2013-12-01 MED ORDER — LIDOCAINE (ANORECTAL) 5 % EX CREA: TOPICAL_CREAM | CUTANEOUS | Status: DC

## 2013-12-05 ENCOUNTER — Telehealth: Payer: Self-pay | Admitting: *Deleted

## 2013-12-05 NOTE — Telephone Encounter (Signed)
I called the patient and left a message.  I told her the pharmacy [facility] [address] in [place], [place].faxed to advise they didn't have the Recticare.  I spoke to the pharmacy today 12-05-2013  And they said they offered the patient a substitute and she declined it, saying she didn't need it.

## 2013-12-20 DIAGNOSIS — Z48810 Encounter for surgical aftercare following surgery on the sense organs: Secondary | ICD-10-CM | POA: Diagnosis not present

## 2013-12-20 DIAGNOSIS — Z8669 Personal history of other diseases of the nervous system and sense organs: Secondary | ICD-10-CM | POA: Diagnosis not present

## 2013-12-20 DIAGNOSIS — Z947 Corneal transplant status: Secondary | ICD-10-CM | POA: Diagnosis not present

## 2013-12-20 DIAGNOSIS — H353190 Nonexudative age-related macular degeneration, unspecified eye, stage unspecified: Secondary | ICD-10-CM | POA: Diagnosis not present

## 2013-12-20 DIAGNOSIS — Z961 Presence of intraocular lens: Secondary | ICD-10-CM | POA: Diagnosis not present

## 2013-12-20 DIAGNOSIS — H579 Unspecified disorder of eye and adnexa: Secondary | ICD-10-CM | POA: Diagnosis not present

## 2013-12-20 DIAGNOSIS — H18519 Endothelial corneal dystrophy, unspecified eye: Secondary | ICD-10-CM | POA: Diagnosis not present

## 2014-02-06 ENCOUNTER — Encounter (HOSPITAL_COMMUNITY): Payer: Self-pay | Admitting: Vascular Surgery

## 2014-02-08 DIAGNOSIS — I10 Essential (primary) hypertension: Secondary | ICD-10-CM | POA: Diagnosis not present

## 2014-02-08 DIAGNOSIS — S32810A Multiple fractures of pelvis with stable disruption of pelvic ring, initial encounter for closed fracture: Secondary | ICD-10-CM | POA: Diagnosis not present

## 2014-02-08 DIAGNOSIS — IMO0001 Reserved for inherently not codable concepts without codable children: Secondary | ICD-10-CM | POA: Diagnosis not present

## 2014-02-08 DIAGNOSIS — I252 Old myocardial infarction: Secondary | ICD-10-CM | POA: Diagnosis not present

## 2014-02-08 DIAGNOSIS — Z9849 Cataract extraction status, unspecified eye: Secondary | ICD-10-CM | POA: Diagnosis not present

## 2014-02-08 DIAGNOSIS — Z961 Presence of intraocular lens: Secondary | ICD-10-CM | POA: Diagnosis not present

## 2014-03-08 ENCOUNTER — Ambulatory Visit (HOSPITAL_COMMUNITY)
Admission: RE | Admit: 2014-03-08 | Discharge: 2014-03-08 | Disposition: A | Discharge status: Home / Self Care | Payer: Medicare Other | Source: Ambulatory Visit | Attending: Internal Medicine | Admitting: Internal Medicine

## 2014-03-08 VITALS — BP 142/80 | HR 107 | Wt 173.8 lb

## 2014-03-08 DIAGNOSIS — I129 Hypertensive chronic kidney disease with stage 1 through stage 4 chronic kidney disease, or unspecified chronic kidney disease: Secondary | ICD-10-CM | POA: Diagnosis not present

## 2014-03-08 DIAGNOSIS — Z8679 Personal history of other diseases of the circulatory system: Secondary | ICD-10-CM | POA: Insufficient documentation

## 2014-03-08 DIAGNOSIS — I498 Other specified cardiac arrhythmias: Secondary | ICD-10-CM | POA: Diagnosis not present

## 2014-03-08 DIAGNOSIS — I509 Heart failure, unspecified: Secondary | ICD-10-CM

## 2014-03-08 DIAGNOSIS — I2510 Atherosclerotic heart disease of native coronary artery without angina pectoris: Secondary | ICD-10-CM | POA: Insufficient documentation

## 2014-03-08 DIAGNOSIS — R000 Tachycardia, unspecified: Secondary | ICD-10-CM | POA: Insufficient documentation

## 2014-03-08 DIAGNOSIS — N189 Chronic kidney disease, unspecified: Secondary | ICD-10-CM | POA: Insufficient documentation

## 2014-03-08 DIAGNOSIS — I5032 Chronic diastolic (congestive) heart failure: Secondary | ICD-10-CM | POA: Diagnosis not present

## 2014-03-08 LAB — PRO B NATRIURETIC PEPTIDE: PRO B NATRI PEPTIDE: 310.3 pg/mL (ref 0–450)

## 2014-03-08 LAB — BASIC METABOLIC PANEL
Anion gap: 12 (ref 5–15)
BUN: 28 mg/dL — ABNORMAL HIGH (ref 6–23)
CO2: 22 mEq/L (ref 19–32)
Calcium: 10.3 mg/dL (ref 8.4–10.5)
Chloride: 104 mEq/L (ref 96–112)
Creatinine, Ser: 1.41 mg/dL — ABNORMAL HIGH (ref 0.50–1.10)
GFR calc Af Amer: 37 mL/min — ABNORMAL LOW (ref >90)
GFR, EST NON AFRICAN AMERICAN: 32 mL/min — AB (ref >90)
GLUCOSE: 107 mg/dL — AB (ref 70–99)
POTASSIUM: 4.7 meq/L (ref 3.7–5.3)
SODIUM: 138 meq/L (ref 137–147)

## 2014-03-08 NOTE — Progress Notes (Signed)
Patient ID: [name], female   DOB: 1922/09/12, 80 y.o.   MRN: [id number] PCP: Dr Linna Darner  HPI: [name] is a 80 y.o. 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 LHC in 6/05 in setting of NSTEMI: ant apical AK and inf-apical AK, EF 29%, dOM2 70-80% (small). EF has recovered. Also has a h/o HTN, SVT and fractures pelvis 05/2012. 02/2012  Discharged form [facility] after a fall which resulted in a cervical fracture.   01/2012 ECHO EF 55% 08/14/13 ECHO EF 55%, normal RV size and systolic function.   She returns for follow up. Doing well. Does all ADLs on her own with cane. Occasional very sharp chest pain when turns over at night. Denies SOB/PND/Orthopnea. No exertional CP. Compliant with medications.   Labs (1/15): K 4.3, creatinine 1.8, BNP 49  ROS: All systems negative except as listed in HPI, PMH and Problem List.  Past Medical History  Diagnosis Date  . 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 Resolved. --dx'd on cath 2005. EF 29% with minimal distal CAD (small OM1 70-80).  Echo 10/09 EF 55%;  echo 01/26/12: EF 55%, mild LAE, PASP 34.  Marland Kitchen Hypertension     SEVERE  . Idiopathic thrombocytopenic purpura (ITP)   . Granuloma annulare   . History of phlebitis   . Lower extremity edema   . Osteoporosis   . Colitis, ischemic   . Fall     with non-healing rib fractures  . Lumbar stenosis 2004    DR. MARK ROY  . AV block, 1st degree   . Complication of anesthesia     post anesthesia excessive somnulence  . CAD (coronary artery disease)     Non ST elevation 2005 ; LHC in 6/05 in setting of NSTEMI: ant apical AK and inf-apical AK, EF 29%, dOM2 70-80% (small); findings c/w Tako-Tsubo CM  . Compression fracture of C-spine 10/10/2011    Fell at home, tx at [facility]  . Diarrhea associated with pseudomembranous colitis 6/11-17/2013    [facility]  . Bradycardia   . Dyslipidemia   . Multiple pelvic fractures 05/2012    Dr Adaline Sill , [facility]  .  Glaucoma     Current Outpatient Prescriptions  Medication Sig Dispense Refill  . benazepril (LOTENSIN) 20 MG tablet Take 1 tablet (20 mg total) by mouth daily.  90 tablet  3  . beta carotene w/minerals (OCUVITE) tablet Take 1 tablet by mouth daily.       . brimonidine (ALPHAGAN) 0.2 % ophthalmic solution Place 1 drop into the right eye 2 (two) times daily.        . Calcium Carbonate-Vitamin D (CALTRATE 600+D PO) Take by mouth 2 (two) times daily.      . carboxymethylcellulose (REFRESH TEARS) 0.5 % SOLN Place 1 drop into both eyes 2 (two) times daily.       . carvedilol (COREG) 25 MG tablet Take 1 tablet (25 mg total) by mouth 2 (two) times daily with a meal.  180 tablet  1  . Cyanocobalamin 1000 MCG/ML KIT Take 1,000 mcg by mouth daily.       . diphenhydrAMINE (BENADRYL) 25 MG tablet Take 25 mg by mouth at bedtime.      . docusate sodium (COLACE) 100 MG capsule Take 100 mg by mouth daily. Stool softener      . fluticasone (FLONASE) 50 MCG/ACT nasal spray Place 1 spray into the nose 2 (two) times daily.       Marland Kitchen  furosemide (LASIX) 40 MG tablet TAKE 1 TABLET (40 MG TOTAL) BY MOUTH DAILY.  90 tablet  1  . ibuprofen (ADVIL,MOTRIN) 200 MG tablet Take 200 mg by mouth as needed.       . Lidocaine, Anorectal, (RECTICARE) 5 % CREA Apply cream as needed  30 g  0  . Misc Natural Products (OSTEO BI-FLEX ADV JOINT SHIELD) TABS Take 2 tablets by mouth daily.        Marland Kitchen omeprazole (PRILOSEC) 20 MG capsule Take 20 mg by mouth daily.        . TERIPARATIDE, RECOMBINANT, [place] Inject 20 mcg into the skin daily.      . timolol (BETIMOL) 0.5 % ophthalmic solution Place 1 drop into the right eye 2 (two) times daily.       No current facility-administered medications for this encounter.     PHYSICAL EXAM: Filed Vitals:   03/08/14 1521  BP: 142/80  Pulse: 107  Weight: 173 lb 12.8 oz (78.835 kg)  SpO2: 97%    General:  Elderly Well appearing. No resp difficulty HEENT: normal Neck: supple. JVP 7 cm. Carotids  2+ bilaterally; no bruits. No lymphadenopathy or thyromegaly appreciated. Cor: PMI normal. Tachy regular No rubs, gallops or murmurs. Lungs: clear Abdomen: soft, nontender, nondistended. No hepatosplenomegaly. No bruits or masses. Good bowel sounds. Extremities: no cyanosis, clubbing, rash, no edema. Neuro: alert & orientedx3, cranial nerves grossly intact. Moves all 4 extremities w/o difficulty. Affect pleasant.   ECG: Sinus tach 108 1avb (232 ms) No ST-T wave abnormalities.    ASSESSMENT & PLAN: 1. Chronic diastolic CHF: History of Takotsubo cardiomyopathy, EF has recovered.   Volume status looks good. Symptoms stable. Continue Lasix 40 mg daily as well as Coreg and benazepril at current doses.  2. CKD: Followed by PCP 3. CAD: No ischemic symptoms.  Nonobstructive CAD on prior cath. 4. Sinus tachycardia - no clear source. Check CBC, TSH, BMET, BNP  Daniel Bensimhon,MD 4:10 PM

## 2014-03-08 NOTE — Patient Instructions (Signed)
Your physician recommends that you schedule a follow-up appointment in: 12 months  Do the following things EVERYDAY: 1) Weigh yourself in the morning before breakfast. Write it down and keep it in a log. 2) Take your medicines as prescribed 3) Eat low salt foods-Limit salt (sodium) to 2000 mg per day.  4) Stay as active as you can everyday 5) Limit all fluids for the day to less than 2 liters 6)

## 2014-03-10 NOTE — Addendum Note (Signed)
Encounter addended by: Georga Kaufmann, CCT on: 03/10/2014 10:08 AM<BR>     Documentation filed: Charges VN

## 2014-03-30 DIAGNOSIS — Z23 Encounter for immunization: Secondary | ICD-10-CM | POA: Diagnosis not present

## 2014-05-23 ENCOUNTER — Other Ambulatory Visit: Payer: Self-pay | Admitting: Hematology and Oncology

## 2014-06-27 ENCOUNTER — Other Ambulatory Visit: Payer: Self-pay | Admitting: Nurse Practitioner

## 2014-06-27 DIAGNOSIS — Z947 Corneal transplant status: Secondary | ICD-10-CM | POA: Diagnosis not present

## 2014-06-27 DIAGNOSIS — Z961 Presence of intraocular lens: Secondary | ICD-10-CM | POA: Diagnosis not present

## 2014-06-27 DIAGNOSIS — H1851 Endothelial corneal dystrophy: Secondary | ICD-10-CM | POA: Diagnosis not present

## 2014-06-27 DIAGNOSIS — H3531 Nonexudative age-related macular degeneration: Secondary | ICD-10-CM | POA: Diagnosis not present

## 2014-08-30 DIAGNOSIS — M810 Age-related osteoporosis without current pathological fracture: Secondary | ICD-10-CM | POA: Diagnosis not present

## 2014-08-30 DIAGNOSIS — H539 Unspecified visual disturbance: Secondary | ICD-10-CM | POA: Diagnosis not present

## 2014-08-30 DIAGNOSIS — Z9181 History of falling: Secondary | ICD-10-CM | POA: Diagnosis not present

## 2014-08-30 DIAGNOSIS — K219 Gastro-esophageal reflux disease without esophagitis: Secondary | ICD-10-CM | POA: Diagnosis not present

## 2014-08-30 DIAGNOSIS — Z87310 Personal history of (healed) osteoporosis fracture: Secondary | ICD-10-CM | POA: Diagnosis not present

## 2014-09-04 ENCOUNTER — Other Ambulatory Visit: Payer: Self-pay | Admitting: Internal Medicine

## 2014-09-10 DIAGNOSIS — Z9842 Cataract extraction status, left eye: Secondary | ICD-10-CM | POA: Diagnosis not present

## 2014-09-10 DIAGNOSIS — Z885 Allergy status to narcotic agent status: Secondary | ICD-10-CM | POA: Diagnosis not present

## 2014-09-10 DIAGNOSIS — Z888 Allergy status to other drugs, medicaments and biological substances status: Secondary | ICD-10-CM | POA: Diagnosis not present

## 2014-09-10 DIAGNOSIS — Z880 Allergy status to penicillin: Secondary | ICD-10-CM | POA: Diagnosis not present

## 2014-09-10 DIAGNOSIS — H43813 Vitreous degeneration, bilateral: Secondary | ICD-10-CM | POA: Diagnosis not present

## 2014-09-10 DIAGNOSIS — Z881 Allergy status to other antibiotic agents status: Secondary | ICD-10-CM | POA: Diagnosis not present

## 2014-09-10 DIAGNOSIS — H409 Unspecified glaucoma: Secondary | ICD-10-CM | POA: Diagnosis not present

## 2014-09-10 DIAGNOSIS — Z48298 Encounter for aftercare following other organ transplant: Secondary | ICD-10-CM | POA: Diagnosis not present

## 2014-09-10 DIAGNOSIS — H1789 Other corneal scars and opacities: Secondary | ICD-10-CM | POA: Diagnosis not present

## 2014-09-10 DIAGNOSIS — Z947 Corneal transplant status: Secondary | ICD-10-CM | POA: Diagnosis not present

## 2014-09-10 DIAGNOSIS — H26492 Other secondary cataract, left eye: Secondary | ICD-10-CM | POA: Diagnosis not present

## 2014-09-10 DIAGNOSIS — Z961 Presence of intraocular lens: Secondary | ICD-10-CM | POA: Diagnosis not present

## 2014-09-10 DIAGNOSIS — Z9841 Cataract extraction status, right eye: Secondary | ICD-10-CM | POA: Diagnosis not present

## 2014-09-10 DIAGNOSIS — I252 Old myocardial infarction: Secondary | ICD-10-CM | POA: Diagnosis not present

## 2014-09-10 DIAGNOSIS — I10 Essential (primary) hypertension: Secondary | ICD-10-CM | POA: Diagnosis not present

## 2014-09-10 DIAGNOSIS — H35063 Retinal vasculitis, bilateral: Secondary | ICD-10-CM | POA: Diagnosis not present

## 2014-09-10 DIAGNOSIS — H3531 Nonexudative age-related macular degeneration: Secondary | ICD-10-CM | POA: Diagnosis not present

## 2014-09-11 DIAGNOSIS — Z780 Asymptomatic menopausal state: Secondary | ICD-10-CM | POA: Diagnosis not present

## 2014-09-11 DIAGNOSIS — M810 Age-related osteoporosis without current pathological fracture: Secondary | ICD-10-CM | POA: Diagnosis not present

## 2014-09-11 LAB — HM DEXA SCAN

## 2014-11-05 ENCOUNTER — Other Ambulatory Visit (HOSPITAL_BASED_OUTPATIENT_CLINIC_OR_DEPARTMENT_OTHER): Payer: Medicare Other

## 2014-11-05 ENCOUNTER — Encounter: Payer: Self-pay | Admitting: Hematology and Oncology

## 2014-11-05 ENCOUNTER — Ambulatory Visit (HOSPITAL_BASED_OUTPATIENT_CLINIC_OR_DEPARTMENT_OTHER): Payer: Medicare Other | Admitting: Hematology and Oncology

## 2014-11-05 VITALS — BP 143/79 | HR 102 | Temp 98.0°F | Resp 18 | Ht 62.0 in | Wt 174.8 lb

## 2014-11-05 DIAGNOSIS — N183 Chronic kidney disease, stage 3 unspecified: Secondary | ICD-10-CM

## 2014-11-05 DIAGNOSIS — D693 Immune thrombocytopenic purpura: Secondary | ICD-10-CM

## 2014-11-05 DIAGNOSIS — D510 Vitamin B12 deficiency anemia due to intrinsic factor deficiency: Secondary | ICD-10-CM | POA: Diagnosis not present

## 2014-11-05 DIAGNOSIS — D631 Anemia in chronic kidney disease: Secondary | ICD-10-CM

## 2014-11-05 LAB — CBC & DIFF AND RETIC
BASO%: 0.6 % (ref 0.0–2.0)
Basophils Absolute: 0 10*3/uL (ref 0.0–0.1)
EOS ABS: 0.6 10*3/uL — AB (ref 0.0–0.5)
EOS%: 8.6 % — ABNORMAL HIGH (ref 0.0–7.0)
HCT: 33.2 % — ABNORMAL LOW (ref 34.8–46.6)
HEMOGLOBIN: 11.2 g/dL — AB (ref 11.6–15.9)
IMMATURE RETIC FRACT: 3.4 % (ref 1.60–10.00)
LYMPH#: 1.5 10*3/uL (ref 0.9–3.3)
LYMPH%: 23 % (ref 14.0–49.7)
MCH: 32.4 pg (ref 25.1–34.0)
MCHC: 33.7 g/dL (ref 31.5–36.0)
MCV: 96 fL (ref 79.5–101.0)
MONO#: 0.5 10*3/uL (ref 0.1–0.9)
MONO%: 8 % (ref 0.0–14.0)
NEUT%: 59.8 % (ref 38.4–76.8)
NEUTROS ABS: 3.9 10*3/uL (ref 1.5–6.5)
Platelets: 74 10*3/uL — ABNORMAL LOW (ref 145–400)
RBC: 3.46 10*6/uL — ABNORMAL LOW (ref 3.70–5.45)
RDW: 14 % (ref 11.2–14.5)
RETIC %: 2.14 % — AB (ref 0.70–2.10)
Retic Ct Abs: 74.04 10*3/uL (ref 33.70–90.70)
WBC: 6.6 10*3/uL (ref 3.9–10.3)

## 2014-11-05 LAB — CHCC SMEAR

## 2014-11-05 LAB — VITAMIN B12: VITAMIN B 12: 476 pg/mL (ref 211–911)

## 2014-11-05 NOTE — Assessment & Plan Note (Signed)
This is a chronic issue and she has not needed treatment for a long time. As long as her platelet count stays above 50,000, she can remain on antiplatelet agents or anticoagulation therapy.

## 2014-11-05 NOTE — Progress Notes (Signed)
[place], [place] SUMMARY OF HEMATOLOGIC HISTORY:  This is a pleasant lady with background history of anemia and thrombocytopenia. She had bone marrow aspirate and biopsy in 2011 which were negative for malignancy. The thrombocytopenia it is thought to be related to ITP. The anemia resolved with vitamin B12 replacement. INTERVAL HISTORY: [name] 81 y.o. female returns for further follow-up. She feels well. Denies recent bleeding. She complained of mild fatigue. Denies any chest pain or shortness of breath.  I have reviewed the past medical history, past surgical history, social history and family history with the patient and they are unchanged from previous note.  ALLERGIES:  is allergic to oxycodone; penicillins; alendronate sodium; colchicine; norvasc; streptomycin; and morphine and related.  MEDICATIONS:  Current Outpatient Prescriptions  Medication Sig Dispense Refill  . benazepril (LOTENSIN) 20 MG tablet Take 1 tablet (20 mg total) by mouth daily. 90 tablet 3  . beta carotene w/minerals (OCUVITE) tablet Take 1 tablet by mouth daily.     . brimonidine (ALPHAGAN) 0.2 % ophthalmic solution Place 1 drop into the right eye 2 (two) times daily.      . Calcium Carbonate-Vitamin D (CALTRATE 600+D PO) Take by mouth 2 (two) times daily.    . carboxymethylcellulose (REFRESH TEARS) 0.5 % SOLN Place 1 drop into both eyes 2 (two) times daily.     . carvedilol (COREG) 25 MG tablet TAKE 1 TABLET TWICE DAILY  WITH  A  MEAL 180 tablet 0  . Cyanocobalamin 1000 MCG/ML KIT Take 1,000 mcg by mouth daily.     Marland Kitchen denosumab (PROLIA) 60 MG/ML SOLN injection Inject 60 mg into the skin every 6 (six) months. Administer in upper arm, thigh, or abdomen    . diphenhydrAMINE (BENADRYL) 25 MG tablet Take 25 mg by mouth at bedtime.    . docusate sodium (COLACE) 100 MG capsule Take 100 mg by mouth daily. Stool softener    . furosemide (LASIX) 40 MG tablet TAKE 1  TABLET (40 MG TOTAL) BY MOUTH DAILY. 90 tablet 1  . ibuprofen (ADVIL,MOTRIN) 200 MG tablet Take 200 mg by mouth as needed.     . Misc Natural Products (OSTEO BI-FLEX ADV JOINT SHIELD) TABS Take 2 tablets by mouth daily.      Marland Kitchen omeprazole (PRILOSEC) 20 MG capsule Take 20 mg by mouth daily.      . timolol (BETIMOL) 0.5 % ophthalmic solution Place 1 drop into the right eye 2 (two) times daily.     No current facility-administered medications for this visit.     REVIEW OF SYSTEMS:   Constitutional: Denies fevers, chills or night sweats Eyes: Denies blurriness of vision Ears, nose, mouth, throat, and face: Denies mucositis or sore throat Respiratory: Denies cough, dyspnea or wheezes Cardiovascular: Denies palpitation, chest discomfort or lower extremity swelling Gastrointestinal:  Denies nausea, heartburn or change in bowel habits Skin: Denies abnormal skin rashes Lymphatics: Denies new lymphadenopathy or easy bruising Neurological:Denies numbness, tingling or new weaknesses Behavioral/Psych: Mood is stable, no new changes  All other systems were reviewed with the patient and are negative.  PHYSICAL EXAMINATION: ECOG PERFORMANCE STATUS: 0 - Asymptomatic  Filed Vitals:   11/05/14 1437  BP: 143/79  Pulse: 102  Temp: 98 F (36.7 C)  Resp: 18   Filed Weights   11/05/14 1437  Weight: 174 lb 12.8 oz (79.289 kg)    GENERAL:alert, no distress and comfortable SKIN: skin color, texture, turgor are normal, no rashes  or significant lesions EYES: normal, Conjunctiva are pink and non-injected, sclera clear Musculoskeletal:no cyanosis of digits and no clubbing  NEURO: alert & oriented x 3 with fluent speech, no focal motor/sensory deficits  LABORATORY DATA:  I have reviewed the data as listed Results for orders placed or performed in visit on 11/05/14 (from the past 48 hour(s))  CBC & Diff and Retic     Status: Abnormal   Collection Time: 11/05/14  3:25 PM  Result Value Ref Range   WBC  6.6 3.9 - 10.3 10e3/uL   NEUT# 3.9 1.5 - 6.5 10e3/uL   HGB 11.2 (L) 11.6 - 15.9 g/dL   HCT 33.2 (L) 34.8 - 46.6 %   Platelets 74 (L) 145 - 400 10e3/uL   MCV 96.0 79.5 - 101.0 fL   MCH 32.4 25.1 - 34.0 pg   MCHC 33.7 31.5 - 36.0 g/dL   RBC 3.46 (L) 3.70 - 5.45 10e6/uL   RDW 14.0 11.2 - 14.5 %   lymph# 1.5 0.9 - 3.3 10e3/uL   MONO# 0.5 0.1 - 0.9 10e3/uL   Eosinophils Absolute 0.6 (H) 0.0 - 0.5 10e3/uL   Basophils Absolute 0.0 0.0 - 0.1 10e3/uL   NEUT% 59.8 38.4 - 76.8 %   LYMPH% 23.0 14.0 - 49.7 %   MONO% 8.0 0.0 - 14.0 %   EOS% 8.6 (H) 0.0 - 7.0 %   BASO% 0.6 0.0 - 2.0 %   Retic % 2.14 (H) 0.70 - 2.10 %   Retic Ct Abs 74.04 33.70 - 90.70 10e3/uL   Immature Retic Fract 3.40 1.60 - 10.00 %    Lab Results  Component Value Date   WBC 6.6 11/05/2014   HGB 11.2* 11/05/2014   HCT 33.2* 11/05/2014   MCV 96.0 11/05/2014   PLT 74* 11/05/2014   ASSESSMENT & PLAN:  ITP (idiopathic thrombocytopenic purpura) This is a chronic issue and she has not needed treatment for a long time. As long as her platelet count stays above 50,000, she can remain on antiplatelet agents or anticoagulation therapy.   ANEMIA, PERNICIOUS She has history of pernicious anemia and anemia of chronic failure. She was switched to oral vitamin B-12 for over a year. Recheck serum B-12 level is pending. I will call her with test results. I recommend close follow-up with PCP in the future.    All questions were answered. The patient knows to call the clinic with any problems, questions or concerns. No barriers to learning was detected.  I spent 15 minutes counseling the patient face to face. The total time spent in the appointment was 20 minutes and more than 50% was on counseling.     [facility], Teriah Muela, MD 4/25/20163:48 PM

## 2014-11-05 NOTE — Assessment & Plan Note (Signed)
She has history of pernicious anemia and anemia of chronic failure. She was switched to oral vitamin B-12 for over a year. Recheck serum B-12 level is pending. I will call her with test results. I recommend close follow-up with PCP in the future.

## 2014-11-06 ENCOUNTER — Telehealth: Payer: Self-pay | Admitting: *Deleted

## 2014-11-06 LAB — IRON AND TIBC CHCC
%SAT: 29 % (ref 21–57)
IRON: 77 ug/dL (ref 41–142)
TIBC: 264 ug/dL (ref 236–444)
UIBC: 187 ug/dL (ref 120–384)

## 2014-11-06 LAB — FERRITIN CHCC: Ferritin: 50 ng/ml (ref 9–269)

## 2014-11-06 NOTE — Telephone Encounter (Signed)
-----   Message from Heath Lark, MD sent at 11/06/2014  9:09 AM EDT ----- Regarding: vitamin B12 level Please let her know tests results are good. Continue monitoring with primary doctor next year

## 2014-12-06 ENCOUNTER — Encounter: Payer: Self-pay | Admitting: *Deleted

## 2014-12-06 ENCOUNTER — Telehealth: Payer: Self-pay | Admitting: *Deleted

## 2014-12-06 NOTE — Telephone Encounter (Signed)
Pre-Visit Call completed with patient and chart updated.   Pre-Visit Info documented in Specialty Comments under [employer].    

## 2014-12-07 ENCOUNTER — Ambulatory Visit (INDEPENDENT_AMBULATORY_CARE_PROVIDER_SITE_OTHER): Payer: Medicare Other | Admitting: Internal Medicine

## 2014-12-07 ENCOUNTER — Encounter: Payer: Self-pay | Admitting: Internal Medicine

## 2014-12-07 VITALS — BP 124/72 | HR 109 | Temp 98.2°F | Ht 62.0 in | Wt 175.0 lb

## 2014-12-07 DIAGNOSIS — M810 Age-related osteoporosis without current pathological fracture: Secondary | ICD-10-CM

## 2014-12-07 DIAGNOSIS — D510 Vitamin B12 deficiency anemia due to intrinsic factor deficiency: Secondary | ICD-10-CM

## 2014-12-07 DIAGNOSIS — I5032 Chronic diastolic (congestive) heart failure: Secondary | ICD-10-CM

## 2014-12-07 DIAGNOSIS — N183 Chronic kidney disease, stage 3 unspecified: Secondary | ICD-10-CM

## 2014-12-07 DIAGNOSIS — I10 Essential (primary) hypertension: Secondary | ICD-10-CM

## 2014-12-07 DIAGNOSIS — D693 Immune thrombocytopenic purpura: Secondary | ICD-10-CM | POA: Diagnosis not present

## 2014-12-07 DIAGNOSIS — Z23 Encounter for immunization: Secondary | ICD-10-CM

## 2014-12-07 LAB — BASIC METABOLIC PANEL
BUN: 25 mg/dL — AB (ref 6–23)
CO2: 28 mEq/L (ref 19–32)
Calcium: 9.3 mg/dL (ref 8.4–10.5)
Chloride: 104 mEq/L (ref 96–112)
Creatinine, Ser: 1.42 mg/dL — ABNORMAL HIGH (ref 0.40–1.20)
GFR: 36.92 mL/min — AB (ref >60.00)
Glucose, Bld: 93 mg/dL (ref 70–99)
Potassium: 4.6 mEq/L (ref 3.5–5.1)
SODIUM: 138 meq/L (ref 135–145)

## 2014-12-07 MED ORDER — BENAZEPRIL HCL 20 MG PO TABS: 20.0000 mg | ORAL_TABLET | Freq: Every day | ORAL | Status: DC

## 2014-12-07 MED ORDER — CARVEDILOL 25 MG PO TABS: 25.0000 mg | ORAL_TABLET | Freq: Two times a day (BID) | ORAL | Status: DC

## 2014-12-07 MED ORDER — FUROSEMIDE 40 MG PO TABS: 40.0000 mg | ORAL_TABLET | Freq: Every day | ORAL | Status: DC

## 2014-12-07 NOTE — Assessment & Plan Note (Signed)
Seems to be well-controlled, cont  with benazepril, carvedilol and Lasix, check a BMP

## 2014-12-07 NOTE — Assessment & Plan Note (Addendum)
Follow-up at [facility] Reports she took Forteo before, then she was  switch to prolia, first injection February 2016.  Plan: Continue calcium, vitamin D and prolia as directed by Dr. Raynelle Bring

## 2014-12-07 NOTE — Assessment & Plan Note (Signed)
Seems to be doing very well from the cardiac standpoint. History of  TAko-Tsubo  cardiomyopathy

## 2014-12-07 NOTE — Patient Instructions (Signed)
Get your blood work before you leave    

## 2014-12-07 NOTE — Progress Notes (Signed)
Pre visit review using our clinic review tool, if applicable. No additional management support is needed unless otherwise documented below in the visit note. 

## 2014-12-07 NOTE — Progress Notes (Signed)
Subjective:    Patient ID: [name], female    DOB: 24-Nov-1922, 81 y.o.   MRN: [id number]  DOS:  12/07/2014 Type of visit - description : Transferring from Dr. Linna Darner Interval history: Hypertension, good compliance of medication, blood pressure is very good today. History of ITP, follow-up by hematology, has been quite stable, last visit with hematology reviewed, follow-up with them when necessary, follow-up by PCP Anemia, pernicious: Also used to see hematology, now to be  follow-up by primary. Cardiovascular: History of bradycardia, cardiomyopathy, CAD: Last visit with cardiology 2013, she seemed stable Osteoporosis, on prolia, calcium and vitamin D   Review of Systems  Denies chest pain or difficulty breathing. No palpitations Seldom she feels like "something is going to happen", presyncope?Marland Kitchen She simply leans over or sits down and symptoms passed within few seconds. No actual syncope. No nausea, vomiting, diarrhea or blood in the stools. No cough or sputum production.  Past Medical History  Diagnosis Date  . 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 Resolved. --dx'd on cath 2005. EF 29% with minimal distal CAD (small OM1 70-80).  Echo 10/09 EF 55%;  echo 01/26/12: EF 55%, mild LAE, PASP 34.  Marland Kitchen Hypertension     SEVERE  . Idiopathic thrombocytopenic purpura (ITP)   . Granuloma annulare   . History of phlebitis   . Lower extremity edema   . Osteoporosis   . Colitis, ischemic   . Fall     with non-healing rib fractures  . Lumbar stenosis 2004    DR. MARK ROY  . AV block, 1st degree   . Complication of anesthesia     post anesthesia excessive somnulence  . CAD (coronary artery disease)     Non ST elevation 2005 ; LHC in 6/05 in setting of NSTEMI: ant apical AK and inf-apical AK, EF 29%, dOM2 70-80% (small); findings c/w Tako-Tsubo CM  . Compression fracture of C-spine 10/10/2011    Fell at home, tx at [facility]  . Diarrhea associated with  pseudomembranous colitis 6/11-17/2013    [facility]  . Bradycardia   . Dyslipidemia   . Multiple pelvic fractures 05/2012    Dr Adaline Sill , [facility]  . Glaucoma     Past Surgical History  Procedure Laterality Date  . Cataract extraction, bilateral    . Colonoscopy w/ polypectomy    . Abdominal hysterectomy    . Rotator cuff repair    . Corneal transplant  11-27-02    left eye  . Cardiac catheterization      12/2003  . Back surgery  10/2004    LS Disc 4-5 no sx.  . Colonoscopy  09/2005    adenoma  . Rectal bleed  05-01-06    colonoscopy-ischemic colitis  . Appendectomy    . Vertebroplasty      Dr Gladstone Lighter    History   Social History  . Marital Status: Widowed    Spouse Name: N/A  . Number of Children: 1  . Years of Education: N/A   Occupational History  . retired    Social History Main Topics  . Smoking status: Never Smoker   . Smokeless tobacco: Never Used  . Alcohol Use: No  . Drug Use: No  . Sexual Activity: Not on file   Other Topics Concern  . Not on file   Social History Narrative   Widowed   Lives by herself in a town house   Son [name]  Medication List       This list is accurate as of: 12/07/14 11:59 PM.  Always use your most recent med list.               benazepril 20 MG tablet  Commonly known as:  LOTENSIN  Take 1 tablet (20 mg total) by mouth daily.     beta carotene w/minerals tablet  Take 1 tablet by mouth daily.     brimonidine 0.2 % ophthalmic solution  Commonly known as:  ALPHAGAN  Place 1 drop into the right eye 2 (two) times daily.     CALTRATE 600+D PO  Take by mouth 2 (two) times daily.     carvedilol 25 MG tablet  Commonly known as:  COREG  Take 1 tablet (25 mg total) by mouth 2 (two) times daily with a meal.     Cyanocobalamin 1000 MCG/ML Kit  Take 1,000 mcg by mouth daily.     denosumab 60 MG/ML Soln injection  Commonly known as:  PROLIA  Inject 60 mg into the skin every 6 (six) months.  Administer in upper arm, thigh, or abdomen     diphenhydrAMINE 25 MG tablet  Commonly known as:  BENADRYL  Take 25 mg by mouth at bedtime.     docusate sodium 100 MG capsule  Commonly known as:  COLACE  Take 100 mg by mouth daily. Stool softener     furosemide 40 MG tablet  Commonly known as:  LASIX  Take 1 tablet (40 mg total) by mouth daily.     ibuprofen 200 MG tablet  Commonly known as:  ADVIL,MOTRIN  Take 200 mg by mouth as needed.     omeprazole 20 MG capsule  Commonly known as:  PRILOSEC  Take 20 mg by mouth daily.     OSTEO BI-FLEX ADV JOINT SHIELD Tabs  Take 2 tablets by mouth daily.     REFRESH TEARS 0.5 % Soln  Generic drug:  carboxymethylcellulose  Place 1 drop into both eyes 2 (two) times daily.     timolol 0.5 % ophthalmic solution  Commonly known as:  BETIMOL  Place 1 drop into the right eye 2 (two) times daily.           Objective:   Physical Exam BP 124/72 mmHg  Pulse 109  Temp(Src) 98.2 F (36.8 C) (Oral)  Ht _0  (1.575 m)  Wt 175 lb (79.379 kg)  BMI 32.00 kg/m2  SpO2 99% General:   Well developed, well nourished . NAD.  HEENT:  Normocephalic . Face symmetric, atraumatic Lungs:  CTA B Normal respiratory effort, no intercostal retractions, no accessory muscle use. Heart: tachycardic,  no murmur.  no pretibial edema bilaterally  Abdomen:  Not distended, soft, non-tender. No rebound or rigidity. No mass,organomegaly Skin: Not pale. Not jaundice Neurologic:  alert & oriented X3.  Speech normal, gait appropriate for age and unassisted Psych--  Cognition and judgment appear intact.  Cooperative with normal attention span and concentration.  Behavior appropriate. No anxious or depressed appearing.       Assessment & Plan:    Chart reviewed:  Flu vaccine-- 03/13/14  Tdap-- 2013 per patient  PNA-- 12/14/07 (23)  Shingles-- has not had due to cost   Pap-- none found  MMG-- none found, has not had in a long time per pt

## 2014-12-07 NOTE — Assessment & Plan Note (Addendum)
  history of pernicious anemia and anemia of chronic dz. She was switched to oral vitamin B-12 ~ 2015 .  Last B12 April 2016 was okay, last hemoglobin 11.2. Plan: Continue with oral supplements

## 2014-12-07 NOTE — Assessment & Plan Note (Signed)
Creatinine has been around 1.4-1.8. Labs today

## 2014-12-07 NOTE — Assessment & Plan Note (Addendum)
Used to see hematology, last visit 11/05/2014, they released her to the care of PCP. Last CBC April 2016, platelets 74,000. Plan: Monitor in 4 months, will reconsult hematology if platelets go below 50,000

## 2015-02-27 DIAGNOSIS — Z961 Presence of intraocular lens: Secondary | ICD-10-CM | POA: Diagnosis not present

## 2015-02-27 DIAGNOSIS — H3531 Nonexudative age-related macular degeneration: Secondary | ICD-10-CM | POA: Diagnosis not present

## 2015-02-27 DIAGNOSIS — H1851 Endothelial corneal dystrophy: Secondary | ICD-10-CM | POA: Diagnosis not present

## 2015-02-27 DIAGNOSIS — Z947 Corneal transplant status: Secondary | ICD-10-CM | POA: Diagnosis not present

## 2015-03-05 DIAGNOSIS — Z23 Encounter for immunization: Secondary | ICD-10-CM | POA: Diagnosis not present

## 2015-03-19 DIAGNOSIS — N289 Disorder of kidney and ureter, unspecified: Secondary | ICD-10-CM | POA: Diagnosis not present

## 2015-03-19 DIAGNOSIS — M810 Age-related osteoporosis without current pathological fracture: Secondary | ICD-10-CM | POA: Diagnosis not present

## 2015-03-19 DIAGNOSIS — Z90710 Acquired absence of both cervix and uterus: Secondary | ICD-10-CM | POA: Diagnosis not present

## 2015-03-19 DIAGNOSIS — L659 Nonscarring hair loss, unspecified: Secondary | ICD-10-CM | POA: Diagnosis not present

## 2015-03-19 DIAGNOSIS — Z780 Asymptomatic menopausal state: Secondary | ICD-10-CM | POA: Diagnosis not present

## 2015-03-19 DIAGNOSIS — Z87311 Personal history of (healed) other pathological fracture: Secondary | ICD-10-CM | POA: Diagnosis not present

## 2015-03-19 DIAGNOSIS — Z79899 Other long term (current) drug therapy: Secondary | ICD-10-CM | POA: Diagnosis not present

## 2015-03-19 DIAGNOSIS — H547 Unspecified visual loss: Secondary | ICD-10-CM | POA: Diagnosis not present

## 2015-03-19 DIAGNOSIS — K219 Gastro-esophageal reflux disease without esophagitis: Secondary | ICD-10-CM | POA: Diagnosis not present

## 2015-03-19 DIAGNOSIS — Z9181 History of falling: Secondary | ICD-10-CM | POA: Diagnosis not present

## 2015-04-09 ENCOUNTER — Encounter: Payer: Self-pay | Admitting: Internal Medicine

## 2015-04-09 ENCOUNTER — Ambulatory Visit (INDEPENDENT_AMBULATORY_CARE_PROVIDER_SITE_OTHER): Payer: Medicare Other | Admitting: Internal Medicine

## 2015-04-09 VITALS — BP 126/74 | HR 70 | Temp 98.0°F | Ht 62.0 in | Wt 173.1 lb

## 2015-04-09 DIAGNOSIS — N183 Chronic kidney disease, stage 3 unspecified: Secondary | ICD-10-CM

## 2015-04-09 DIAGNOSIS — D693 Immune thrombocytopenic purpura: Secondary | ICD-10-CM | POA: Diagnosis not present

## 2015-04-09 DIAGNOSIS — I10 Essential (primary) hypertension: Secondary | ICD-10-CM

## 2015-04-09 DIAGNOSIS — Z09 Encounter for follow-up examination after completed treatment for conditions other than malignant neoplasm: Secondary | ICD-10-CM

## 2015-04-09 LAB — CBC WITH DIFFERENTIAL/PLATELET
Basophils Absolute: 0 10*3/uL (ref 0.0–0.1)
Basophils Relative: 0.3 % (ref 0.0–3.0)
EOS ABS: 0.4 10*3/uL (ref 0.0–0.7)
Eosinophils Relative: 5.6 % — ABNORMAL HIGH (ref 0.0–5.0)
HCT: 36.5 % (ref 36.0–46.0)
HEMOGLOBIN: 12.3 g/dL (ref 12.0–15.0)
LYMPHS PCT: 19.5 % (ref 12.0–46.0)
Lymphs Abs: 1.3 10*3/uL (ref 0.7–4.0)
MCHC: 33.7 g/dL (ref 30.0–36.0)
MCV: 93.9 fl (ref 78.0–100.0)
Monocytes Absolute: 0.4 10*3/uL (ref 0.1–1.0)
Monocytes Relative: 6.3 % (ref 3.0–12.0)
Neutro Abs: 4.5 10*3/uL (ref 1.4–7.7)
Neutrophils Relative %: 68.3 % (ref 43.0–77.0)
Platelets: 65 10*3/uL — ABNORMAL LOW (ref 150.0–400.0)
RBC: 3.89 Mil/uL (ref 3.87–5.11)
RDW: 14.3 % (ref 11.5–15.5)
WBC: 6.5 10*3/uL (ref 4.0–10.5)

## 2015-04-09 NOTE — Progress Notes (Signed)
Subjective:    Patient ID: [name], female    DOB: 21-Mar-1923, 81 y.o.   MRN: [id number]  DOS:  04/09/2015 Type of visit - description : Routine visit Interval history: In general feeling well, no concerns. Already had a flu shot. Since the last time she was here, she saw Dr. Raynelle Bring for osteoporosis management.    Review of Systems  Denies any chest pain, difficulty breathing. Edema well-controlled No nausea, vomiting, diarrhea blood in the stools.  Past Medical History  Diagnosis Date  . 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 Resolved. --dx'd on cath 2005. EF 29% with minimal distal CAD (small OM1 70-80).  Echo 10/09 EF 55%;  echo 01/26/12: EF 55%, mild LAE, PASP 34.  Marland Kitchen Hypertension     SEVERE  . Idiopathic thrombocytopenic purpura (ITP)   . Granuloma annulare   . History of phlebitis   . Lower extremity edema   . Osteoporosis   . Colitis, ischemic   . Fall     with non-healing rib fractures  . Lumbar stenosis 2004    DR. MARK ROY  . AV block, 1st degree   . Complication of anesthesia     post anesthesia excessive somnulence  . CAD (coronary artery disease)     Non ST elevation 2005 ; LHC in 6/05 in setting of NSTEMI: ant apical AK and inf-apical AK, EF 29%, dOM2 70-80% (small); findings c/w Tako-Tsubo CM  . Compression fracture of C-spine 10/10/2011    Fell at home, tx at [facility]  . Diarrhea associated with pseudomembranous colitis 6/11-17/2013    [facility]  . Bradycardia   . Dyslipidemia   . Multiple pelvic fractures 05/2012    Dr Adaline Sill , [facility]  . Glaucoma     Past Surgical History  Procedure Laterality Date  . Cataract extraction, bilateral    . Colonoscopy w/ polypectomy    . Abdominal hysterectomy    . Rotator cuff repair    . Corneal transplant  11-27-02    left eye  . Cardiac catheterization      12/2003  . Back surgery  10/2004    LS Disc 4-5 no sx.  . Colonoscopy  09/2005    adenoma  . Rectal bleed  05-01-06   colonoscopy-ischemic colitis  . Appendectomy    . Vertebroplasty      Dr Gladstone Lighter    Social History   Social History  . Marital Status: Widowed    Spouse Name: N/A  . Number of Children: 1  . Years of Education: N/A   Occupational History  . retired    Social History Main Topics  . Smoking status: Never Smoker   . Smokeless tobacco: Never Used  . Alcohol Use: No  . Drug Use: No  . Sexual Activity: Not on file   Other Topics Concern  . Not on file   Social History Narrative   Widowed   Lives by herself in a town house   Son Medea Deines            Medication List       This list is accurate as of: 04/09/15  4:51 PM.  Always use your most recent med list.               benazepril 20 MG tablet  Commonly known as:  LOTENSIN  Take 1 tablet (20 mg total) by mouth daily.     beta carotene w/minerals tablet  Take 1 tablet  by mouth daily.     brimonidine 0.2 % ophthalmic solution  Commonly known as:  ALPHAGAN  Place 1 drop into the right eye 2 (two) times daily.     CALCIUM 600 PO  Take 2 tablets by mouth daily.     carvedilol 25 MG tablet  Commonly known as:  COREG  Take 1 tablet (25 mg total) by mouth 2 (two) times daily with a meal.     vitamin B-12 1000 MCG tablet  Commonly known as:  CYANOCOBALAMIN  Take 1,000 mcg by mouth daily.     Cyanocobalamin 1000 MCG/ML Kit  Take 1,000 mcg by mouth daily.     denosumab 60 MG/ML Soln injection  Commonly known as:  PROLIA  Inject 60 mg into the skin every 6 (six) months. Administer in upper arm, thigh, or abdomen     diphenhydrAMINE 25 MG tablet  Commonly known as:  BENADRYL  Take 25 mg by mouth at bedtime.     docusate sodium 100 MG capsule  Commonly known as:  COLACE  Take 100 mg by mouth daily. Stool softener     furosemide 40 MG tablet  Commonly known as:  LASIX  Take 1 tablet (40 mg total) by mouth daily.     ibuprofen 200 MG tablet  Commonly known as:  ADVIL,MOTRIN  Take 200 mg by mouth as  needed.     omeprazole 20 MG capsule  Commonly known as:  PRILOSEC  Take 20 mg by mouth daily.     OSTEO BI-FLEX ADV JOINT SHIELD Tabs  Take 2 tablets by mouth daily.     REFRESH TEARS 0.5 % Soln  Generic drug:  carboxymethylcellulose  Place 1 drop into both eyes 2 (two) times daily.     timolol 0.5 % ophthalmic solution  Commonly known as:  BETIMOL  Place 1 drop into the right eye 2 (two) times daily.     Vitamin D3 3000 UNITS Tabs  Take 1 tablet by mouth daily.           Objective:   Physical Exam BP 126/74 mmHg  Pulse 70  Temp(Src) 98 F (36.7 C) (Oral)  Ht _0  (1.575 m)  Wt 173 lb 2 oz (78.529 kg)  BMI 31.66 kg/m2  SpO2 98% General:   Well developed, well nourished . NAD.  HEENT:  Normocephalic . Face symmetric, atraumatic Lungs:  Dry crackles at bases Normal respiratory effort, no intercostal retractions, no accessory muscle use. Heart: RRR,  no murmur.  No pretibial edema bilaterally  Skin: Not pale. Not jaundice Neurologic:  alert & oriented X3.  Speech normal, gait appropriate for age and unassisted Psych--  Cognition and judgment appear intact.  Cooperative with normal attention span and concentration.  Behavior appropriate. No anxious or depressed appearing.      Assessment & Plan:   Assessment > HTN severe CKD Dyslipidemia Hematology: --ITP-- hematology visit for 11-05-14, released to the care of PCP , check CBCs every 4 months, consult hematology if platelets <50 K --Pernicious anemia --Anemia of chronic disease CV: --CAD --Tako Tsubo cardiomyopathy --AV block first-degree DJD: --Compression fractures --Lumbar stenosis Dr. Carloyn Manner --Multiple pelvic fractures , cervical spine compression  fracture 2013 [facility] --H/o nonhealing rib  Osteoporosis: Dr. Agapito Games; s/p forteo, on Prolia H/o pseudomembranous colitis 2013 Glaucoma  S/p cornea transplant h/o granuloma annulare  Plan Hypertension: Well controlled CKD:  labs elsewhere  03/19/2015: Creatinine 1.2, potassium 4.3. Recommend to avoid excessive NSAIDs for renal protection. A tablet  of Motrin 3 or 4 times a week I think is acceptable. CAD: Asymptomatic, not on statins, not interested on medications. I think that given her age and the fact that she is doing well, she won'tl need to treat her cholesterol aggressively. Osteoporosis: Had Prolia  #2 02-2015. Dr. Raynelle Bring is managing, she is on calcium and vitamin D. ITP: Due for a CBC Return to the office in 4-5 months

## 2015-04-09 NOTE — Progress Notes (Signed)
Pre visit review using our clinic review tool, if applicable. No additional management support is needed unless otherwise documented below in the visit note. 

## 2015-04-09 NOTE — Patient Instructions (Signed)
Get your blood work before you leave    Next visit  for a  routine checkup in 4-5 months, nonfasting  (30 minutes) Please schedule an appointment at the front desk

## 2015-04-09 NOTE — Assessment & Plan Note (Signed)
Hypertension: Well controlled CKD:  labs elsewhere 03/19/2015: Creatinine 1.2, potassium 4.3. Recommend to avoid excessive NSAIDs for renal protection. A tablet of Motrin 3 or 4 times a week I think is acceptable. CAD: Asymptomatic, not on statins, not interested on medications. I think that given her age and the fact that she is doing well, she won'tl need to treat her cholesterol aggressively. Osteoporosis: Had Prolia  #2 02-2015. Dr. Raynelle Bring is managing, she is on calcium and vitamin D. ITP: Due for a CBC Return to the office in 4-5 months

## 2015-04-10 NOTE — Patient Outreach (Signed)
[facility]) Care Management  04/10/2015  [name] 16-May-1923 [id number]   Referral from [employer] Tier 2 List, assigned Candie Mile, RN.  Thanks, Ronnell Freshwater. [place], [place] Assistant Phone: [phone] Fax: [phone]

## 2015-04-12 ENCOUNTER — Other Ambulatory Visit: Payer: Self-pay | Admitting: Internal Medicine

## 2015-04-12 ENCOUNTER — Other Ambulatory Visit: Payer: Self-pay | Admitting: *Deleted

## 2015-04-15 ENCOUNTER — Encounter: Payer: Self-pay | Admitting: *Deleted

## 2015-04-15 NOTE — Patient Outreach (Signed)
[facility]) Care Management  04/15/2015  KHIYA WESTHOFF 1923/05/24 [id number]  RN Health Coach telephone to patient. [employer] compliance verified. RN Health Coach spoke with patient and described the services available through [employer] Patient declined service. RN Health Coach will send an outreach packet to patient.  [place] Care Management [phone]

## 2015-04-19 NOTE — Patient Outreach (Signed)
[facility]) Care Management  04/19/2015  [name] 12/17/1922 [id number]   Notification from Candie Mile, RN to close case due to patient refused [place] Management services.  Thanks, Ronnell Freshwater. [place], [place] Assistant Phone: [phone] Fax: [phone]

## 2015-05-22 DIAGNOSIS — Z66 Do not resuscitate: Secondary | ICD-10-CM | POA: Diagnosis not present

## 2015-05-22 DIAGNOSIS — Z885 Allergy status to narcotic agent status: Secondary | ICD-10-CM | POA: Diagnosis not present

## 2015-05-22 DIAGNOSIS — I5181 Takotsubo syndrome: Secondary | ICD-10-CM | POA: Diagnosis not present

## 2015-05-22 DIAGNOSIS — I4891 Unspecified atrial fibrillation: Secondary | ICD-10-CM | POA: Diagnosis not present

## 2015-05-22 DIAGNOSIS — Z79899 Other long term (current) drug therapy: Secondary | ICD-10-CM | POA: Diagnosis not present

## 2015-05-22 DIAGNOSIS — Z888 Allergy status to other drugs, medicaments and biological substances status: Secondary | ICD-10-CM | POA: Diagnosis not present

## 2015-05-22 DIAGNOSIS — R072 Precordial pain: Secondary | ICD-10-CM | POA: Diagnosis not present

## 2015-05-22 DIAGNOSIS — E876 Hypokalemia: Secondary | ICD-10-CM | POA: Diagnosis not present

## 2015-05-22 DIAGNOSIS — Z880 Allergy status to penicillin: Secondary | ICD-10-CM | POA: Diagnosis not present

## 2015-05-22 DIAGNOSIS — K219 Gastro-esophageal reflux disease without esophagitis: Secondary | ICD-10-CM | POA: Diagnosis not present

## 2015-05-22 DIAGNOSIS — I10 Essential (primary) hypertension: Secondary | ICD-10-CM | POA: Diagnosis not present

## 2015-05-22 DIAGNOSIS — K449 Diaphragmatic hernia without obstruction or gangrene: Secondary | ICD-10-CM | POA: Diagnosis not present

## 2015-05-22 DIAGNOSIS — I5030 Unspecified diastolic (congestive) heart failure: Secondary | ICD-10-CM | POA: Diagnosis not present

## 2015-05-22 DIAGNOSIS — R9431 Abnormal electrocardiogram [ECG] [EKG]: Secondary | ICD-10-CM | POA: Diagnosis not present

## 2015-05-22 DIAGNOSIS — R000 Tachycardia, unspecified: Secondary | ICD-10-CM | POA: Diagnosis not present

## 2015-05-22 DIAGNOSIS — R079 Chest pain, unspecified: Secondary | ICD-10-CM | POA: Diagnosis not present

## 2015-05-23 DIAGNOSIS — R079 Chest pain, unspecified: Secondary | ICD-10-CM | POA: Diagnosis not present

## 2015-08-12 ENCOUNTER — Ambulatory Visit (INDEPENDENT_AMBULATORY_CARE_PROVIDER_SITE_OTHER): Payer: Medicare Other | Admitting: Internal Medicine

## 2015-08-12 ENCOUNTER — Encounter: Payer: Self-pay | Admitting: Internal Medicine

## 2015-08-12 VITALS — BP 122/74 | HR 87 | Temp 97.9°F | Ht 62.0 in | Wt 163.6 lb

## 2015-08-12 DIAGNOSIS — I10 Essential (primary) hypertension: Secondary | ICD-10-CM

## 2015-08-12 DIAGNOSIS — D693 Immune thrombocytopenic purpura: Secondary | ICD-10-CM | POA: Diagnosis not present

## 2015-08-12 DIAGNOSIS — N183 Chronic kidney disease, stage 3 unspecified: Secondary | ICD-10-CM

## 2015-08-12 DIAGNOSIS — I5032 Chronic diastolic (congestive) heart failure: Secondary | ICD-10-CM

## 2015-08-12 NOTE — Patient Instructions (Addendum)
BEFORE YOU LEAVE THE OFFICE:  Go to the lab, get your blood work done.  GO TO THE FRONT DESK   Schedule a routine office visit or check up to be done in  3 months  Fasting is optional  Front desk:      30

## 2015-08-12 NOTE — Progress Notes (Addendum)
Subjective:    Patient ID: [name], female    DOB: October 03, 1922, 82 y.o.   MRN: [id number]  DOS:  08/12/2015 Type of visit - description :  Interval history:  Admitted to an outside hospital ~11-04/2015 with chest pain, palpitations, elevated phosphorus ; D-dimer was a slightly positive. Cardiac enzymes were negative, chest pain was felt to be a typical, chest CT negative for PE. She was released home. Labs from 05-2015: Normal TSH, A1c 5.1, hemoglobin 11.2. Platelets 62. Creatinine 1.0, potassium 3.6. LFTs normal. Phosphorus 2.3.  Review of Systems  Currently feeling well. Denies chest pain or difficulty breathing or lower extremity edema. No nausea, vomiting, diarrhea  Past Medical History  Diagnosis Date  . Cardiomyopathy, nonischemic ([place]) 04/2008    Likely Tako-Tsubo. Resolved. --dx'd on cath 2005. EF 29% with minimal distal CAD (small OM1 70-80).  Echo 10/09 EF 55%;  echo 01/26/12: EF 55%, mild LAE, PASP 34.  Marland Kitchen Hypertension     SEVERE  . Idiopathic thrombocytopenic purpura (ITP)   . Granuloma annulare   . History of phlebitis   . Lower extremity edema   . Osteoporosis   . Colitis, ischemic ([place])   . Fall     with non-healing rib fractures  . Lumbar stenosis 2004    DR. MARK ROY  . AV block, 1st degree   . Complication of anesthesia     post anesthesia excessive somnulence  . CAD (coronary artery disease)     Non ST elevation 2005 ; LHC in 6/05 in setting of NSTEMI: ant apical AK and inf-apical AK, EF 29%, dOM2 70-80% (small); findings c/w Tako-Tsubo CM  . Compression fracture of C-spine ([place]) 10/10/2011    Fell at home, tx at [facility]  . Diarrhea associated with pseudomembranous colitis 6/11-17/2013    [facility]  . Bradycardia   . Dyslipidemia   . Multiple pelvic fractures ([place]) 05/2012    Dr Adaline Sill , [facility]  . Glaucoma     Past Surgical History  Procedure Laterality Date  . Cataract extraction, bilateral    . Colonoscopy w/ polypectomy    .  Abdominal hysterectomy    . Rotator cuff repair    . Corneal transplant  11-27-02    left eye  . Cardiac catheterization      12/2003  . Back surgery  10/2004    LS Disc 4-5 no sx.  . Colonoscopy  09/2005    adenoma  . Rectal bleed  05-01-06    colonoscopy-ischemic colitis  . Appendectomy    . Vertebroplasty      Dr Gladstone Lighter    Social History   Social History  . Marital Status: Widowed    Spouse Name: N/A  . Number of Children: 1  . Years of Education: N/A   Occupational History  . retired    Social History Main Topics  . Smoking status: Never Smoker   . Smokeless tobacco: Never Used  . Alcohol Use: No  . Drug Use: No  . Sexual Activity: Not on file   Other Topics Concern  . Not on file   Social History Narrative   Widowed   Lives by herself in a town house, drives    Son Cherye Handcock            Medication List       This list is accurate as of: 08/12/15  9:07 PM.  Always use your most recent med list.  benazepril 20 MG tablet  Commonly known as:  LOTENSIN  Take 1 tablet (20 mg total) by mouth daily.     beta carotene w/minerals tablet  Take 1 tablet by mouth daily.     brimonidine 0.2 % ophthalmic solution  Commonly known as:  ALPHAGAN  Place 1 drop into the right eye 2 (two) times daily.     CALCIUM 600 PO  Take 2 tablets by mouth daily.     carvedilol 25 MG tablet  Commonly known as:  COREG  Take 1 tablet (25 mg total) by mouth 2 (two) times daily with a meal.     denosumab 60 MG/ML Soln injection  Commonly known as:  PROLIA  Inject 60 mg into the skin every 6 (six) months. Administer in upper arm, thigh, or abdomen     diphenhydrAMINE 25 MG tablet  Commonly known as:  BENADRYL  Take 25 mg by mouth at bedtime.     docusate sodium 100 MG capsule  Commonly known as:  COLACE  Take 100 mg by mouth daily. Stool softener     furosemide 40 MG tablet  Commonly known as:  LASIX  Take 1 tablet (40 mg total) by mouth daily.      ibuprofen 200 MG tablet  Commonly known as:  ADVIL,MOTRIN  Take 200 mg by mouth as needed.     omeprazole 20 MG capsule  Commonly known as:  PRILOSEC  Take 20 mg by mouth daily.     OSTEO BI-FLEX ADV JOINT SHIELD Tabs  Take 2 tablets by mouth daily.     REFRESH TEARS 0.5 % Soln  Generic drug:  carboxymethylcellulose  Place 1 drop into both eyes 2 (two) times daily.     timolol 0.5 % ophthalmic solution  Commonly known as:  BETIMOL  Place 1 drop into the right eye 2 (two) times daily.     vitamin B-12 1000 MCG tablet  Commonly known as:  CYANOCOBALAMIN  Take 1,000 mcg by mouth daily.     Vitamin D3 3000 units Tabs  Take 1 tablet by mouth daily.           Objective:   Physical Exam BP 122/74 mmHg  Pulse 87  Temp(Src) 97.9 F (36.6 C) (Oral)  Ht 5\' 2"  (1.575 m)  Wt 163 lb 9.6 oz (74.208 kg)  BMI 29.92 kg/m2  SpO2 97% General:   Well developed, well nourished . NAD.  HEENT:  Normocephalic . Face symmetric, atraumatic Lungs:  Dry crackles @ bases Normal respiratory effort, no intercostal retractions, no accessory muscle use. Heart: RRR,  no murmur.  No pretibial edema bilaterally  Skin: Not pale. Not jaundice Neurologic:  alert & oriented X3.  Speech normal, gait appropriate for age and unassisted Psych--  Cognition and judgment appear intact.  Cooperative with normal attention span and concentration.  Behavior appropriate. No anxious or depressed appearing.      Assessment & Plan:   Assessment > HTN severe CKD: creat ~1.4, 1.5 Dyslipidemia Hematology: --ITP-- hematology visit for 11-05-14, released to the care of PCP , check CBCs every 4 months, consult hematology if platelets <50 K --Pernicious anemia --Anemia of chronic disease CV: --CAD --Tako Tsubo cardiomyopathy --AV block first-degree DJD: --Compression fractures --Lumbar stenosis Dr. Carloyn Manner --Multiple pelvic fractures , cervical spine compression  fracture 2013 [facility] --H/o nonhealing rib    Osteoporosis: Dr. Agapito Games; s/p forteo, on Prolia H/o pseudomembranous colitis 2013 Glaucoma  S/p cornea transplant h/o granuloma annulare  Plan HTN:  Well-controlled, check BMP and phosphorus CKD: Creatinine at the hospital 1.0, usually 1.4, 1.5. Check a BMP. ITP: CBC 2 months ago stable. CAD, cardiomyopathy: Status post admission to an outside hospital with chest pain and palpitations, now stable. No change. Plan: RTC 3  months. Refill medications as needed.

## 2015-08-12 NOTE — Progress Notes (Signed)
Pre visit review using our clinic review tool, if applicable. No additional management support is needed unless otherwise documented below in the visit note. 

## 2015-08-13 LAB — BASIC METABOLIC PANEL
BUN: 33 mg/dL — ABNORMAL HIGH (ref 6–23)
CALCIUM: 10 mg/dL (ref 8.4–10.5)
CO2: 29 mEq/L (ref 19–32)
Chloride: 103 mEq/L (ref 96–112)
Creatinine, Ser: 1.53 mg/dL — ABNORMAL HIGH (ref 0.40–1.20)
GFR: 33.82 mL/min — AB (ref >60.00)
GLUCOSE: 99 mg/dL (ref 70–99)
Potassium: 4.4 mEq/L (ref 3.5–5.1)
SODIUM: 140 meq/L (ref 135–145)

## 2015-08-13 LAB — PHOSPHORUS: PHOSPHORUS: 2.8 mg/dL (ref 2.3–4.6)

## 2015-08-26 ENCOUNTER — Other Ambulatory Visit: Payer: Self-pay | Admitting: Internal Medicine

## 2015-09-16 DIAGNOSIS — M810 Age-related osteoporosis without current pathological fracture: Secondary | ICD-10-CM | POA: Diagnosis not present

## 2015-09-16 DIAGNOSIS — Z961 Presence of intraocular lens: Secondary | ICD-10-CM | POA: Diagnosis not present

## 2015-09-16 DIAGNOSIS — H353130 Nonexudative age-related macular degeneration, bilateral, stage unspecified: Secondary | ICD-10-CM | POA: Diagnosis not present

## 2015-09-16 DIAGNOSIS — I10 Essential (primary) hypertension: Secondary | ICD-10-CM | POA: Diagnosis not present

## 2015-09-16 DIAGNOSIS — I252 Old myocardial infarction: Secondary | ICD-10-CM | POA: Diagnosis not present

## 2015-09-16 DIAGNOSIS — H401130 Primary open-angle glaucoma, bilateral, stage unspecified: Secondary | ICD-10-CM | POA: Diagnosis not present

## 2015-09-16 DIAGNOSIS — H26492 Other secondary cataract, left eye: Secondary | ICD-10-CM | POA: Diagnosis not present

## 2015-09-16 DIAGNOSIS — H01025 Squamous blepharitis left lower eyelid: Secondary | ICD-10-CM | POA: Diagnosis not present

## 2015-09-16 DIAGNOSIS — Z880 Allergy status to penicillin: Secondary | ICD-10-CM | POA: Diagnosis not present

## 2015-09-16 DIAGNOSIS — H04123 Dry eye syndrome of bilateral lacrimal glands: Secondary | ICD-10-CM | POA: Diagnosis not present

## 2015-09-16 DIAGNOSIS — H01022 Squamous blepharitis right lower eyelid: Secondary | ICD-10-CM | POA: Diagnosis not present

## 2015-09-16 DIAGNOSIS — H01024 Squamous blepharitis left upper eyelid: Secondary | ICD-10-CM | POA: Diagnosis not present

## 2015-09-16 DIAGNOSIS — Z947 Corneal transplant status: Secondary | ICD-10-CM | POA: Diagnosis not present

## 2015-09-16 DIAGNOSIS — Z888 Allergy status to other drugs, medicaments and biological substances status: Secondary | ICD-10-CM | POA: Diagnosis not present

## 2015-09-16 DIAGNOSIS — H43813 Vitreous degeneration, bilateral: Secondary | ICD-10-CM | POA: Diagnosis not present

## 2015-09-16 DIAGNOSIS — H01021 Squamous blepharitis right upper eyelid: Secondary | ICD-10-CM | POA: Diagnosis not present

## 2015-09-23 ENCOUNTER — Encounter (HOSPITAL_BASED_OUTPATIENT_CLINIC_OR_DEPARTMENT_OTHER): Payer: Self-pay | Admitting: *Deleted

## 2015-09-23 ENCOUNTER — Inpatient Hospital Stay (HOSPITAL_BASED_OUTPATIENT_CLINIC_OR_DEPARTMENT_OTHER)
Admission: EM | Admit: 2015-09-23 | Discharge: 2015-09-27 | DRG: 690 | Disposition: A | Discharge status: Home / Self Care | Payer: Medicare Other | Attending: Internal Medicine | Admitting: Internal Medicine

## 2015-09-23 ENCOUNTER — Emergency Department (HOSPITAL_BASED_OUTPATIENT_CLINIC_OR_DEPARTMENT_OTHER): Payer: Medicare Other

## 2015-09-23 DIAGNOSIS — Z66 Do not resuscitate: Secondary | ICD-10-CM | POA: Diagnosis present

## 2015-09-23 DIAGNOSIS — I2510 Atherosclerotic heart disease of native coronary artery without angina pectoris: Secondary | ICD-10-CM | POA: Diagnosis present

## 2015-09-23 DIAGNOSIS — M810 Age-related osteoporosis without current pathological fracture: Secondary | ICD-10-CM | POA: Diagnosis present

## 2015-09-23 DIAGNOSIS — I429 Cardiomyopathy, unspecified: Secondary | ICD-10-CM | POA: Diagnosis not present

## 2015-09-23 DIAGNOSIS — Z885 Allergy status to narcotic agent status: Secondary | ICD-10-CM

## 2015-09-23 DIAGNOSIS — N189 Chronic kidney disease, unspecified: Secondary | ICD-10-CM

## 2015-09-23 DIAGNOSIS — N183 Chronic kidney disease, stage 3 (moderate): Secondary | ICD-10-CM | POA: Diagnosis not present

## 2015-09-23 DIAGNOSIS — K30 Functional dyspepsia: Secondary | ICD-10-CM | POA: Diagnosis present

## 2015-09-23 DIAGNOSIS — D638 Anemia in other chronic diseases classified elsewhere: Secondary | ICD-10-CM | POA: Diagnosis present

## 2015-09-23 DIAGNOSIS — I129 Hypertensive chronic kidney disease with stage 1 through stage 4 chronic kidney disease, or unspecified chronic kidney disease: Secondary | ICD-10-CM | POA: Diagnosis present

## 2015-09-23 DIAGNOSIS — N184 Chronic kidney disease, stage 4 (severe): Secondary | ICD-10-CM | POA: Diagnosis present

## 2015-09-23 DIAGNOSIS — N12 Tubulo-interstitial nephritis, not specified as acute or chronic: Secondary | ICD-10-CM | POA: Diagnosis present

## 2015-09-23 DIAGNOSIS — I10 Essential (primary) hypertension: Secondary | ICD-10-CM | POA: Diagnosis present

## 2015-09-23 DIAGNOSIS — I252 Old myocardial infarction: Secondary | ICD-10-CM

## 2015-09-23 DIAGNOSIS — I5032 Chronic diastolic (congestive) heart failure: Secondary | ICD-10-CM | POA: Diagnosis present

## 2015-09-23 DIAGNOSIS — D693 Immune thrombocytopenic purpura: Secondary | ICD-10-CM | POA: Diagnosis present

## 2015-09-23 DIAGNOSIS — R05 Cough: Secondary | ICD-10-CM | POA: Diagnosis not present

## 2015-09-23 DIAGNOSIS — B961 Klebsiella pneumoniae [K. pneumoniae] as the cause of diseases classified elsewhere: Secondary | ICD-10-CM | POA: Diagnosis present

## 2015-09-23 DIAGNOSIS — Z800 Family history of malignant neoplasm of digestive organs: Secondary | ICD-10-CM

## 2015-09-23 DIAGNOSIS — N179 Acute kidney failure, unspecified: Secondary | ICD-10-CM | POA: Diagnosis not present

## 2015-09-23 DIAGNOSIS — H409 Unspecified glaucoma: Secondary | ICD-10-CM | POA: Diagnosis present

## 2015-09-23 DIAGNOSIS — R509 Fever, unspecified: Secondary | ICD-10-CM | POA: Diagnosis not present

## 2015-09-23 DIAGNOSIS — N10 Acute tubulo-interstitial nephritis: Principal | ICD-10-CM | POA: Diagnosis present

## 2015-09-23 DIAGNOSIS — I5033 Acute on chronic diastolic (congestive) heart failure: Secondary | ICD-10-CM

## 2015-09-23 DIAGNOSIS — R112 Nausea with vomiting, unspecified: Secondary | ICD-10-CM | POA: Diagnosis not present

## 2015-09-23 DIAGNOSIS — B9620 Unspecified Escherichia coli [E. coli] as the cause of diseases classified elsewhere: Secondary | ICD-10-CM | POA: Diagnosis present

## 2015-09-23 DIAGNOSIS — Z8672 Personal history of thrombophlebitis: Secondary | ICD-10-CM

## 2015-09-23 DIAGNOSIS — I5181 Takotsubo syndrome: Secondary | ICD-10-CM | POA: Diagnosis present

## 2015-09-23 DIAGNOSIS — Z881 Allergy status to other antibiotic agents status: Secondary | ICD-10-CM

## 2015-09-23 DIAGNOSIS — Z79899 Other long term (current) drug therapy: Secondary | ICD-10-CM

## 2015-09-23 DIAGNOSIS — E785 Hyperlipidemia, unspecified: Secondary | ICD-10-CM | POA: Diagnosis present

## 2015-09-23 DIAGNOSIS — Z803 Family history of malignant neoplasm of breast: Secondary | ICD-10-CM

## 2015-09-23 DIAGNOSIS — Z880 Allergy status to penicillin: Secondary | ICD-10-CM

## 2015-09-23 DIAGNOSIS — Z8051 Family history of malignant neoplasm of kidney: Secondary | ICD-10-CM

## 2015-09-23 DIAGNOSIS — I440 Atrioventricular block, first degree: Secondary | ICD-10-CM | POA: Diagnosis present

## 2015-09-23 DIAGNOSIS — Z888 Allergy status to other drugs, medicaments and biological substances status: Secondary | ICD-10-CM

## 2015-09-23 DIAGNOSIS — N390 Urinary tract infection, site not specified: Secondary | ICD-10-CM

## 2015-09-23 DIAGNOSIS — Z8249 Family history of ischemic heart disease and other diseases of the circulatory system: Secondary | ICD-10-CM

## 2015-09-23 DIAGNOSIS — D510 Vitamin B12 deficiency anemia due to intrinsic factor deficiency: Secondary | ICD-10-CM | POA: Diagnosis present

## 2015-09-23 DIAGNOSIS — Z823 Family history of stroke: Secondary | ICD-10-CM

## 2015-09-23 LAB — CBC WITH DIFFERENTIAL/PLATELET
Basophils Absolute: 0 10*3/uL (ref 0.0–0.1)
Basophils Relative: 0 %
Eosinophils Absolute: 0.1 10*3/uL (ref 0.0–0.7)
Eosinophils Relative: 1 %
HCT: 38 % (ref 36.0–46.0)
Hemoglobin: 12.9 g/dL (ref 12.0–15.0)
Lymphocytes Relative: 7 %
Lymphs Abs: 0.3 10*3/uL — ABNORMAL LOW (ref 0.7–4.0)
MCH: 32 pg (ref 26.0–34.0)
MCHC: 33.9 g/dL (ref 30.0–36.0)
MCV: 94.3 fL (ref 78.0–100.0)
Monocytes Absolute: 0.2 10*3/uL (ref 0.1–1.0)
Monocytes Relative: 4 %
Neutro Abs: 4.3 10*3/uL (ref 1.7–7.7)
Neutrophils Relative %: 88 %
Platelets: 53 10*3/uL — ABNORMAL LOW (ref 150–400)
RBC: 4.03 MIL/uL (ref 3.87–5.11)
RDW: 13.3 % (ref 11.5–15.5)
WBC: 5 10*3/uL (ref 4.0–10.5)

## 2015-09-23 LAB — COMPREHENSIVE METABOLIC PANEL
ALT: 12 U/L — ABNORMAL LOW (ref 14–54)
AST: 23 U/L (ref 15–41)
Albumin: 3.7 g/dL (ref 3.5–5.0)
Alkaline Phosphatase: 73 U/L (ref 38–126)
Anion gap: 9 (ref 5–15)
BUN: 33 mg/dL — ABNORMAL HIGH (ref 6–20)
CO2: 24 mmol/L (ref 22–32)
Calcium: 8.2 mg/dL — ABNORMAL LOW (ref 8.9–10.3)
Chloride: 107 mmol/L (ref 101–111)
Creatinine, Ser: 1.36 mg/dL — ABNORMAL HIGH (ref 0.44–1.00)
GFR calc Af Amer: 38 mL/min — ABNORMAL LOW (ref >60)
GFR calc non Af Amer: 33 mL/min — ABNORMAL LOW (ref >60)
Glucose, Bld: 166 mg/dL — ABNORMAL HIGH (ref 65–99)
Potassium: 4.3 mmol/L (ref 3.5–5.1)
Sodium: 140 mmol/L (ref 135–145)
Total Bilirubin: 1.1 mg/dL (ref 0.3–1.2)
Total Protein: 6.2 g/dL — ABNORMAL LOW (ref 6.5–8.1)

## 2015-09-23 LAB — URINALYSIS, ROUTINE W REFLEX MICROSCOPIC
Bilirubin Urine: NEGATIVE
Glucose, UA: NEGATIVE mg/dL
Ketones, ur: 15 mg/dL — AB
Nitrite: POSITIVE — AB
Protein, ur: NEGATIVE mg/dL
Specific Gravity, Urine: 1.017 (ref 1.005–1.030)
pH: 6.5 (ref 5.0–8.0)

## 2015-09-23 LAB — LIPASE, BLOOD: Lipase: 25 U/L (ref 11–51)

## 2015-09-23 LAB — TROPONIN I: Troponin I: <0.03 ng/mL (ref <0.031)

## 2015-09-23 LAB — URINE MICROSCOPIC-ADD ON

## 2015-09-23 LAB — I-STAT CG4 LACTIC ACID, ED: Lactic Acid, Venous: 1.51 mmol/L (ref 0.5–2.0)

## 2015-09-23 MED ORDER — ONDANSETRON 4 MG PO TBDP
4.0000 mg | ORAL_TABLET | Freq: Once | ORAL | Status: AC
  Administered 2015-09-23: 4 mg via ORAL
  Filled 2015-09-23: qty 1

## 2015-09-23 MED ORDER — ACETAMINOPHEN 500 MG PO TABS
1000.0000 mg | ORAL_TABLET | Freq: Once | ORAL | Status: AC
Start: 2015-09-23 — End: 2015-09-23
  Administered 2015-09-23: 1000 mg via ORAL
  Filled 2015-09-23: qty 2

## 2015-09-23 MED ORDER — SODIUM CHLORIDE 0.9 % IV BOLUS (SEPSIS)
1000.0000 mL | Freq: Once | INTRAVENOUS | Status: AC
  Administered 2015-09-23: 1000 mL via INTRAVENOUS

## 2015-09-23 MED ORDER — CEFTRIAXONE SODIUM 1 G IJ SOLR
1.0000 g | Freq: Once | INTRAMUSCULAR | Status: AC
  Administered 2015-09-23: 1 g via INTRAVENOUS
  Filled 2015-09-23: qty 10

## 2015-09-23 NOTE — ED Notes (Addendum)
N/V/D that started today.  Reports that her chest feels funny and she is SOB.  Pt dry heaving in triage.

## 2015-09-23 NOTE — ED Provider Notes (Signed)
CSN: [id number]     Arrival date & time 09/23/15  1854 History   First MD Initiated Contact with Patient 09/23/15 1952     Chief Complaint  Patient presents with  . Emesis     (Consider location/radiation/quality/duration/timing/severity/associated sxs/prior Treatment) HPI Patient presents to the emergency department with nausea, vomiting, diarrhea that started last night.  The patient states that she has been unable to keep any fluids down by mouth.  She states she has had some cough for the last 2 weeks.  She denies headache, blurred vision, back pain, neck pain, dysuria, incontinence, bloody stool, hematemesis, weakness, dizziness, lightheadedness, rash, near syncope or syncope.  Patient states she did not take any medications prior to arrival Past Medical History  Diagnosis Date  . Cardiomyopathy, nonischemic ([place]) 04/2008    Likely Tako-Tsubo. Resolved. --dx'd on cath 2005. EF 29% with minimal distal CAD (small OM1 70-80).  Echo 10/09 EF 55%;  echo 01/26/12: EF 55%, mild LAE, PASP 34.  Marland Kitchen Hypertension     SEVERE  . Idiopathic thrombocytopenic purpura (ITP)   . Granuloma annulare   . History of phlebitis   . Lower extremity edema   . Osteoporosis   . Colitis, ischemic ([place])   . Fall     with non-healing rib fractures  . Lumbar stenosis 2004    DR. MARK ROY  . AV block, 1st degree   . Complication of anesthesia     post anesthesia excessive somnulence  . CAD (coronary artery disease)     Non ST elevation 2005 ; LHC in 6/05 in setting of NSTEMI: ant apical AK and inf-apical AK, EF 29%, dOM2 70-80% (small); findings c/w Tako-Tsubo CM  . Compression fracture of C-spine ([place]) 10/10/2011    Fell at home, tx at [facility]  . Diarrhea associated with pseudomembranous colitis 6/11-17/2013    [facility]  . Bradycardia   . Dyslipidemia   . Multiple pelvic fractures ([place]) 05/2012    Dr Adaline Sill , [facility]  . Glaucoma    Past Surgical History  Procedure Laterality Date  . Cataract  extraction, bilateral    . Colonoscopy w/ polypectomy    . Abdominal hysterectomy    . Rotator cuff repair    . Corneal transplant  11-27-02    left eye  . Cardiac catheterization      12/2003  . Back surgery  10/2004    LS Disc 4-5 no sx.  . Colonoscopy  09/2005    adenoma  . Rectal bleed  05-01-06    colonoscopy-ischemic colitis  . Appendectomy    . Vertebroplasty      Dr Gladstone Lighter   Family History  Problem Relation Age of Onset  . Coronary artery disease Mother   . Heart failure Mother   . Renal cancer Mother   . Kidney disease Mother   . Stroke Father     in his 53s  . Colon cancer Father   . Diabetes Neg Hx   . Breast cancer Sister      X 2  . Rectal cancer Sister   . Breast cancer Sister    Social History  Substance Use Topics  . Smoking status: Never Smoker   . Smokeless tobacco: Never Used  . Alcohol Use: No   OB History    No data available     Review of Systems  All other systems negative except as documented in the HPI. All pertinent positives and negatives as reviewed in the HPI.  Allergies  Morphine and related; Oxycodone; Penicillins; Colchicine; Hydroxychloroquine; Norvasc; Ofloxacin; Streptomycin; Tramadol; and Alendronate sodium  Home Medications   Prior to Admission medications   Medication Sig Start Date End Date Taking? Authorizing Provider  benazepril (LOTENSIN) 20 MG tablet Take 1 tablet (20 mg total) by mouth daily. 08/27/15   Colon Branch, MD  beta carotene w/minerals (OCUVITE) tablet Take 1 tablet by mouth daily.     Historical Provider, MD  brimonidine (ALPHAGAN) 0.2 % ophthalmic solution Place 1 drop into the right eye 2 (two) times daily.      Historical Provider, MD  Calcium Carbonate (CALCIUM 600 PO) Take 2 tablets by mouth daily.    Historical Provider, MD  carboxymethylcellulose (REFRESH TEARS) 0.5 % SOLN Place 1 drop into both eyes 2 (two) times daily.     Historical Provider, MD  carvedilol (COREG) 25 MG tablet Take 1 tablet (25 mg  total) by mouth 2 (two) times daily with a meal. 08/27/15   Colon Branch, MD  Cholecalciferol (VITAMIN D3) 3000 UNITS TABS Take 1 tablet by mouth daily.    Historical Provider, MD  denosumab (PROLIA) 60 MG/ML SOLN injection Inject 60 mg into the skin every 6 (six) months. Administer in upper arm, thigh, or abdomen    Historical Provider, MD  diphenhydrAMINE (BENADRYL) 25 MG tablet Take 25 mg by mouth at bedtime.    Historical Provider, MD  docusate sodium (COLACE) 100 MG capsule Take 100 mg by mouth daily. Stool softener    Historical Provider, MD  furosemide (LASIX) 40 MG tablet Take 1 tablet (40 mg total) by mouth daily. 08/27/15   Colon Branch, MD  ibuprofen (ADVIL,MOTRIN) 200 MG tablet Take 200 mg by mouth as needed.     Historical Provider, MD  Misc Natural Products (OSTEO BI-FLEX ADV JOINT SHIELD) TABS Take 2 tablets by mouth daily.      Historical Provider, MD  omeprazole (PRILOSEC) 20 MG capsule Take 20 mg by mouth daily.      Historical Provider, MD  timolol (BETIMOL) 0.5 % ophthalmic solution Place 1 drop into the right eye 2 (two) times daily.    Historical Provider, MD  vitamin B-12 (CYANOCOBALAMIN) 1000 MCG tablet Take 1,000 mcg by mouth daily.    Historical Provider, MD   BP 110/61 mmHg  Pulse 118  Temp(Src) 101.4 F (38.6 C) (Oral)  Resp 22  Ht 5\' 4"  (1.626 m)  Wt 74.844 kg  BMI 28.31 kg/m2  SpO2 92% Physical Exam  Constitutional: She is oriented to person, place, and time. She appears well-developed and well-nourished. No distress.  HENT:  Head: Normocephalic and atraumatic.  Mouth/Throat: Oropharynx is clear and moist.  Eyes: Pupils are equal, round, and reactive to light.  Neck: Normal range of motion. Neck supple.  Cardiovascular: Normal rate, regular rhythm and normal heart sounds.  Exam reveals no gallop and no friction rub.   No murmur heard. Pulmonary/Chest: Effort normal and breath sounds normal. No respiratory distress. She has no wheezes.  Abdominal: Soft. Bowel  sounds are normal. She exhibits no distension. There is no tenderness. There is no rebound and no guarding.  Neurological: She is alert and oriented to person, place, and time. She exhibits normal muscle tone. Coordination normal.  Skin: Skin is warm and dry. No rash noted. No erythema.  Psychiatric: She has a normal mood and affect. Her behavior is normal.  Nursing note and vitals reviewed.   ED Course  Procedures (including critical care time) Labs  Review Labs Reviewed  URINALYSIS, ROUTINE W REFLEX MICROSCOPIC (NOT AT [facility]) - Abnormal; Notable for the following:    APPearance CLOUDY (*)    Hgb urine dipstick SMALL (*)    Ketones, ur 15 (*)    Nitrite POSITIVE (*)    Leukocytes, UA MODERATE (*)    All other components within normal limits  COMPREHENSIVE METABOLIC PANEL - Abnormal; Notable for the following:    Glucose, Bld 166 (*)    BUN 33 (*)    Creatinine, Ser 1.36 (*)    Calcium 8.2 (*)    Total Protein 6.2 (*)    ALT 12 (*)    GFR calc non Af Amer 33 (*)    GFR calc Af Amer 38 (*)    All other components within normal limits  CBC WITH DIFFERENTIAL/PLATELET - Abnormal; Notable for the following:    Platelets 53 (*)    Lymphs Abs 0.3 (*)    All other components within normal limits  URINE MICROSCOPIC-ADD ON - Abnormal; Notable for the following:    Squamous Epithelial / LPF 0-5 (*)    Bacteria, UA MANY (*)    All other components within normal limits  URINE CULTURE  LIPASE, BLOOD  TROPONIN I  I-STAT CG4 LACTIC ACID, ED    Imaging Review Dg Chest 2 View  09/23/2015  CLINICAL DATA:  Cough with fever EXAM: CHEST  2 VIEW COMPARISON:  07/28/2013 FINDINGS: Chronic hyperinflation. There is no edema, consolidation, effusion, or pneumothorax. Chronic borderline cardiomegaly and aortic tortuosity. Moderate hiatal hernia. Remote right rib fractures. No acute osseous finding. IMPRESSION: Stable.  No evidence of acute cardiopulmonary disease. Electronically Signed   By: Monte Fantasia M.D.   On: 09/23/2015 21:01   I have personally reviewed and evaluated these images and lab results as part of my medical decision-making.   EKG Interpretation None        Patient be admitted to the hospital for observation for her pyelonephritis.  Patient is advised of the plan and all questions were answered.  I spoke with the [facility] who agrees to accept the patient in transfer  Dalia Heading, PA-C 09/24/15 [place], MD 09/24/15 1744

## 2015-09-23 NOTE — ED Notes (Signed)
Have asked pt. For urine but Pt. Unable at this time.

## 2015-09-23 NOTE — Progress Notes (Signed)
82 yo F hx HTN, ITP, diastolic CHF, stress CM, CKD.  Presents with fever 102F rectal and N/V, diarrhea.  UA shows UTI.  Got Rocephin.  Plan to obs overnight, PO challenge tomorrow and then home.  To med surg bed, OBS status.

## 2015-09-24 ENCOUNTER — Encounter (HOSPITAL_COMMUNITY): Payer: Self-pay | Admitting: Family Medicine

## 2015-09-24 DIAGNOSIS — B961 Klebsiella pneumoniae [K. pneumoniae] as the cause of diseases classified elsewhere: Secondary | ICD-10-CM | POA: Diagnosis present

## 2015-09-24 DIAGNOSIS — I2510 Atherosclerotic heart disease of native coronary artery without angina pectoris: Secondary | ICD-10-CM | POA: Diagnosis present

## 2015-09-24 DIAGNOSIS — M810 Age-related osteoporosis without current pathological fracture: Secondary | ICD-10-CM | POA: Diagnosis present

## 2015-09-24 DIAGNOSIS — Z885 Allergy status to narcotic agent status: Secondary | ICD-10-CM | POA: Diagnosis not present

## 2015-09-24 DIAGNOSIS — D693 Immune thrombocytopenic purpura: Secondary | ICD-10-CM | POA: Diagnosis present

## 2015-09-24 DIAGNOSIS — Z880 Allergy status to penicillin: Secondary | ICD-10-CM | POA: Diagnosis not present

## 2015-09-24 DIAGNOSIS — Z888 Allergy status to other drugs, medicaments and biological substances status: Secondary | ICD-10-CM | POA: Diagnosis not present

## 2015-09-24 DIAGNOSIS — B9620 Unspecified Escherichia coli [E. coli] as the cause of diseases classified elsewhere: Secondary | ICD-10-CM | POA: Diagnosis present

## 2015-09-24 DIAGNOSIS — E785 Hyperlipidemia, unspecified: Secondary | ICD-10-CM | POA: Diagnosis present

## 2015-09-24 DIAGNOSIS — I252 Old myocardial infarction: Secondary | ICD-10-CM | POA: Diagnosis not present

## 2015-09-24 DIAGNOSIS — Z881 Allergy status to other antibiotic agents status: Secondary | ICD-10-CM | POA: Diagnosis not present

## 2015-09-24 DIAGNOSIS — K30 Functional dyspepsia: Secondary | ICD-10-CM | POA: Diagnosis present

## 2015-09-24 DIAGNOSIS — D510 Vitamin B12 deficiency anemia due to intrinsic factor deficiency: Secondary | ICD-10-CM | POA: Diagnosis present

## 2015-09-24 DIAGNOSIS — I5032 Chronic diastolic (congestive) heart failure: Secondary | ICD-10-CM | POA: Diagnosis not present

## 2015-09-24 DIAGNOSIS — Z8672 Personal history of thrombophlebitis: Secondary | ICD-10-CM | POA: Diagnosis not present

## 2015-09-24 DIAGNOSIS — I129 Hypertensive chronic kidney disease with stage 1 through stage 4 chronic kidney disease, or unspecified chronic kidney disease: Secondary | ICD-10-CM | POA: Diagnosis present

## 2015-09-24 DIAGNOSIS — D638 Anemia in other chronic diseases classified elsewhere: Secondary | ICD-10-CM | POA: Diagnosis present

## 2015-09-24 DIAGNOSIS — I10 Essential (primary) hypertension: Secondary | ICD-10-CM | POA: Diagnosis not present

## 2015-09-24 DIAGNOSIS — N12 Tubulo-interstitial nephritis, not specified as acute or chronic: Secondary | ICD-10-CM | POA: Diagnosis not present

## 2015-09-24 DIAGNOSIS — N179 Acute kidney failure, unspecified: Secondary | ICD-10-CM | POA: Diagnosis not present

## 2015-09-24 DIAGNOSIS — I440 Atrioventricular block, first degree: Secondary | ICD-10-CM | POA: Diagnosis present

## 2015-09-24 DIAGNOSIS — Z8249 Family history of ischemic heart disease and other diseases of the circulatory system: Secondary | ICD-10-CM | POA: Diagnosis not present

## 2015-09-24 DIAGNOSIS — N10 Acute tubulo-interstitial nephritis: Secondary | ICD-10-CM | POA: Diagnosis present

## 2015-09-24 DIAGNOSIS — H409 Unspecified glaucoma: Secondary | ICD-10-CM | POA: Diagnosis present

## 2015-09-24 DIAGNOSIS — Z79899 Other long term (current) drug therapy: Secondary | ICD-10-CM | POA: Diagnosis not present

## 2015-09-24 DIAGNOSIS — R112 Nausea with vomiting, unspecified: Secondary | ICD-10-CM | POA: Diagnosis not present

## 2015-09-24 DIAGNOSIS — Z66 Do not resuscitate: Secondary | ICD-10-CM | POA: Diagnosis present

## 2015-09-24 DIAGNOSIS — Z8051 Family history of malignant neoplasm of kidney: Secondary | ICD-10-CM | POA: Diagnosis not present

## 2015-09-24 DIAGNOSIS — Z803 Family history of malignant neoplasm of breast: Secondary | ICD-10-CM | POA: Diagnosis not present

## 2015-09-24 DIAGNOSIS — I5181 Takotsubo syndrome: Secondary | ICD-10-CM | POA: Diagnosis present

## 2015-09-24 DIAGNOSIS — Z800 Family history of malignant neoplasm of digestive organs: Secondary | ICD-10-CM | POA: Diagnosis not present

## 2015-09-24 DIAGNOSIS — Z823 Family history of stroke: Secondary | ICD-10-CM | POA: Diagnosis not present

## 2015-09-24 DIAGNOSIS — N183 Chronic kidney disease, stage 3 (moderate): Secondary | ICD-10-CM | POA: Diagnosis not present

## 2015-09-24 DIAGNOSIS — I429 Cardiomyopathy, unspecified: Secondary | ICD-10-CM | POA: Diagnosis present

## 2015-09-24 LAB — CBC
HEMATOCRIT: 32.7 % — AB (ref 36.0–46.0)
HEMOGLOBIN: 10.8 g/dL — AB (ref 12.0–15.0)
MCH: 31.9 pg (ref 26.0–34.0)
MCHC: 33 g/dL (ref 30.0–36.0)
MCV: 96.5 fL (ref 78.0–100.0)
Platelets: 47 10*3/uL — ABNORMAL LOW (ref 150–400)
RBC: 3.39 MIL/uL — ABNORMAL LOW (ref 3.87–5.11)
RDW: 14.1 % (ref 11.5–15.5)
WBC: 3.2 10*3/uL — ABNORMAL LOW (ref 4.0–10.5)

## 2015-09-24 LAB — BASIC METABOLIC PANEL
Anion gap: 9 (ref 5–15)
BUN: 35 mg/dL — AB (ref 6–20)
CO2: 22 mmol/L (ref 22–32)
Calcium: 7.5 mg/dL — ABNORMAL LOW (ref 8.9–10.3)
Chloride: 111 mmol/L (ref 101–111)
Creatinine, Ser: 1.43 mg/dL — ABNORMAL HIGH (ref 0.44–1.00)
GFR calc Af Amer: 36 mL/min — ABNORMAL LOW (ref >60)
GFR, EST NON AFRICAN AMERICAN: 31 mL/min — AB (ref >60)
GLUCOSE: 107 mg/dL — AB (ref 65–99)
Potassium: 3.8 mmol/L (ref 3.5–5.1)
Sodium: 142 mmol/L (ref 135–145)

## 2015-09-24 LAB — INFLUENZA PANEL BY PCR (TYPE A & B)
H1N1 flu by pcr: NOT DETECTED
INFLBPCR: NEGATIVE
Influenza A By PCR: NEGATIVE

## 2015-09-24 MED ORDER — BENAZEPRIL HCL 20 MG PO TABS
20.0000 mg | ORAL_TABLET | Freq: Every day | ORAL | Status: DC
  Administered 2015-09-24 – 2015-09-27 (×4): 20 mg via ORAL
  Filled 2015-09-24 (×4): qty 1

## 2015-09-24 MED ORDER — ACETAMINOPHEN 325 MG PO TABS
650.0000 mg | ORAL_TABLET | Freq: Four times a day (QID) | ORAL | Status: DC | PRN
  Administered 2015-09-25 – 2015-09-27 (×4): 650 mg via ORAL
  Filled 2015-09-24 (×4): qty 2

## 2015-09-24 MED ORDER — ACETAMINOPHEN 650 MG RE SUPP: 650.0000 mg | Freq: Four times a day (QID) | RECTAL | Status: DC | PRN

## 2015-09-24 MED ORDER — DEXTROSE 5 % IV SOLN
1.0000 g | INTRAVENOUS | Status: DC
  Administered 2015-09-24 – 2015-09-26 (×3): 1 g via INTRAVENOUS
  Filled 2015-09-24 (×3): qty 10

## 2015-09-24 MED ORDER — CARVEDILOL 25 MG PO TABS
25.0000 mg | ORAL_TABLET | Freq: Two times a day (BID) | ORAL | Status: DC
  Administered 2015-09-24 – 2015-09-27 (×7): 25 mg via ORAL
  Filled 2015-09-24 (×10): qty 1

## 2015-09-24 MED ORDER — SODIUM CHLORIDE 0.9 % IV SOLN
INTRAVENOUS | Status: DC
  Administered 2015-09-24: 1000 mL via INTRAVENOUS

## 2015-09-24 MED ORDER — SODIUM CHLORIDE 0.9 % IV SOLN
INTRAVENOUS | Status: DC
  Administered 2015-09-24: 05:00:00 via INTRAVENOUS

## 2015-09-24 NOTE — ED Notes (Signed)
Temp on Pt. Orally is 98.1 before Pt. Discharged taken by MD RN

## 2015-09-24 NOTE — Progress Notes (Signed)
PROGRESS NOTE    [name]  [id number]  DOB: 25-Oct-1922  DOA: 09/23/2015 PCP: Kathlene November, MD Outpatient Specialists:   Hospital course: 82 year old female patient with history of HTN, ITP, nonischemic cardiomyopathy (Takotsubo), CAD, chronic diastolic CHF, HLD, diarrhea due to pseudomembranous colitis, pernicious anemia presented to ED with 1 day history of nausea, vomiting, fever, left flank pain and frequent watery crampy bowel movements. In the ED, the patient was febrile to 101.30F, tachycadic, but with normal BP. Na 140, K 4.3, Cr 1.36, AST/ALT normal, WBC 5, Hgb 12.9, Plts 53, lactate normal. UA showed full field WBC and bacteria, and so antibiotics were administered and she was admitted for UTI/pyelonephritis without sepsis. She denied dysuria or urinary frequency.  Assessment & Plan:   Acute pyelonephritis - Patient presented with fever, nausea, vomiting, diarrhea and flank pain. Urine microscopy strongly suggestive of UTI. - Influenza panel PCR negative. Chest x-ray without acute findings. - Other DD: Acute viral GE - Clinically improved. Nausea, vomiting, diarrhea and flank pain have resolved. - Continue IV Rocephin pending urine culture results.  Chronic ITP - Monitor platelets closely. Baseline is not clearly known. Admitted with platelet count of 53 which has dropped to 47. No bleeding reported. - No heparin products. SCDs for DVT prophylaxis.  Essential hypertension - Had soft blood pressures which have improved. Continue benazepril and carvedilol. Holding Lasix temporarily.  Stage III chronic kidney disease - Stable  CAD/nonischemic cardiomyopathy/chronic diastolic CHF - Clinically compensated. Diuretics temporarily on hold, may be resumed in a day or 2.  Anemia/pancytopenia - ? Secondary to acute illness versus from pernicious anemia. Follow CBC in a.m.   DVT prophylaxis: SCDs Code Status: DO NOT RESUSCITATE Family Communication: None at  bedside Disposition Plan: DC home when medically stable, possibly in the next 48 hours   Consultants:  None  Procedures:  None  Antimicrobials:  IV Rocephin 3/13 >  Subjective: Feels better. No further diarrhea, nausea, vomiting or flank pain since last night.  Objective: Filed Vitals:   09/24/15 0030 09/24/15 0034 09/24/15 0200 09/24/15 0524  BP: 82/46 98/56 117/67 104/57  Pulse: 110 109 109 102  Temp:   98.3 F (36.8 C) 97.5 F (36.4 C)  TempSrc:   Oral Oral  Resp:   17 18  Height:      Weight:      SpO2: 92% 95% 97% 95%    Intake/Output Summary (Last 24 hours) at 09/24/15 1447 Last data filed at 09/24/15 1000  Gross per 24 hour  Intake    500 ml  Output    200 ml  Net    300 ml   Filed Weights   09/23/15 1906  Weight: 74.844 kg (165 lb)    Exam:  General exam: Pleasant elderly female lying comfortably supine in bed. Respiratory system: Clear. No increased work of breathing. Cardiovascular system: S1 & S2 heard, RRR. No JVD, murmurs, gallops, clicks or pedal edema. Gastrointestinal system: Abdomen is nondistended, soft and nontender. Normal bowel sounds heard. Central nervous system: Alert and oriented. No focal neurological deficits. Extremities: Symmetric 5 x 5 power.   Data Reviewed: Basic Metabolic Panel:  Recent Labs Lab 09/23/15 2028 09/24/15 0539  NA 140 142  K 4.3 3.8  CL 107 111  CO2 24 22  GLUCOSE 166* 107*  BUN 33* 35*  CREATININE 1.36* 1.43*  CALCIUM 8.2* 7.5*   Liver Function Tests:  Recent Labs Lab 09/23/15 2028  AST 23  ALT 12*  ALKPHOS 73  BILITOT 1.1  PROT 6.2*  ALBUMIN 3.7    Recent Labs Lab 09/23/15 2028  LIPASE 25   No results for input(s): AMMONIA in the last 168 hours. CBC:  Recent Labs Lab 09/23/15 2028 09/24/15 0539  WBC 5.0 3.2*  NEUTROABS 4.3  --   HGB 12.9 10.8*  HCT 38.0 32.7*  MCV 94.3 96.5  PLT 53* 47*   Cardiac Enzymes:  Recent Labs Lab 09/23/15 2028  TROPONINI <0.03   BNP  (last 3 results) No results for input(s): PROBNP in the last 8760 hours. CBG: No results for input(s): GLUCAP in the last 168 hours.  No results found for this or any previous visit (from the past 240 hour(s)).       Studies: Dg Chest 2 View  09/23/2015  CLINICAL DATA:  Cough with fever EXAM: CHEST  2 VIEW COMPARISON:  07/28/2013 FINDINGS: Chronic hyperinflation. There is no edema, consolidation, effusion, or pneumothorax. Chronic borderline cardiomegaly and aortic tortuosity. Moderate hiatal hernia. Remote right rib fractures. No acute osseous finding. IMPRESSION: Stable.  No evidence of acute cardiopulmonary disease. Electronically Signed   By: Monte Fantasia M.D.   On: 09/23/2015 21:01        Scheduled Meds: . benazepril  20 mg Oral Daily  . carvedilol  25 mg Oral BID WC  . cefTRIAXone (ROCEPHIN)  IV  1 g Intravenous Q24H   Continuous Infusions: . sodium chloride 125 mL/hr at 09/24/15 0445    Principal Problem:   Pyelonephritis Active Problems:   *ANEMIA, PERNICIOUS   *Essential hypertension   *ITP (idiopathic thrombocytopenic purpura)   *Diastolic CHF, chronic   *CKD (chronic kidney disease) stage 3, GFR 30-59 ml/min    Time spent: 35 minutes.    Vernell Leep, MD, FACP, FHM. [facility] Pager [phone]  If 7PM-7AM, please contact night-coverage [email or web address] Password [email or web address] 09/24/2015, 2:47 PM    LOS: 0 days

## 2015-09-24 NOTE — H&P (Signed)
History and Physical  Patient Name: [name]     [id number]    DOB: May 14, 1923    DOA: 09/23/2015 Referring physician: Irena Cords, PA-C PCP: Kathlene November, MD      Chief Complaint: N/V  HPI: [name] is a 82 y.o. female with a past medical history significant for HTN, ITP, diastolic CHF and hx of Takotsubo, hx of Cdiff, and pernicious anemia who presents with 1d nausea and vomiting and fever.  The patient was in her usual state of health until the day before admission when she developed malaise, nausea and vomiting, left flank pain and frequent watery crampy bowel movements.  She couldn't keep anything down and felt that she had a fever, and so she came to the ER.    In the ED, the patient was febrile to 101.62F, tachycadic, but with normal BP.  Na 140, K 4.3, Cr 1.36, AST/ALT normal, WBC 5, Hgb 12.9, Plts 53, lactate normal.  UA showed full field WBC and bacteria, and so antibiotics were administered and she was admitted for UTI/pyelonephritis without sepsis.  She had no previous dysuria or urinary urgency, only flank pain and chronic urinary frequency. She had no preceding respiratory symptoms.  She had no hematochezia and denies mucus like BMs like when she had C diff before.  No recent antibiotics use.  No recent food exposures.     Review of Systems:  All other systems negative except as just noted or noted in the history of present illness.  Allergies  Allergen Reactions  . Morphine And Related Anaphylaxis and Nausea Only    Per family violently ill  . Oxycodone     Dizziness   . Penicillins     REACTION: joint edema  . Colchicine     REACTION: hair loss  . Hydroxychloroquine Other (See Comments)    unknown  . Norvasc [Amlodipine Besylate]   . Ofloxacin Other (See Comments)    unknown  . Streptomycin Other (See Comments)    Unknown  . Tramadol Itching  . Alendronate Sodium Palpitations    Prior to Admission medications   Medication Sig Start Date  End Date Taking? Authorizing Provider  benazepril (LOTENSIN) 20 MG tablet Take 1 tablet (20 mg total) by mouth daily. 08/27/15   Colon Branch, MD  beta carotene w/minerals (OCUVITE) tablet Take 1 tablet by mouth daily.     Historical Provider, MD  brimonidine (ALPHAGAN) 0.2 % ophthalmic solution Place 1 drop into the right eye 2 (two) times daily.      Historical Provider, MD  Calcium Carbonate (CALCIUM 600 PO) Take 2 tablets by mouth daily.    Historical Provider, MD  carboxymethylcellulose (REFRESH TEARS) 0.5 % SOLN Place 1 drop into both eyes 2 (two) times daily.     Historical Provider, MD  carvedilol (COREG) 25 MG tablet Take 1 tablet (25 mg total) by mouth 2 (two) times daily with a meal. 08/27/15   Colon Branch, MD  Cholecalciferol (VITAMIN D3) 3000 UNITS TABS Take 1 tablet by mouth daily.    Historical Provider, MD  denosumab (PROLIA) 60 MG/ML SOLN injection Inject 60 mg into the skin every 6 (six) months. Administer in upper arm, thigh, or abdomen    Historical Provider, MD  diphenhydrAMINE (BENADRYL) 25 MG tablet Take 25 mg by mouth at bedtime.    Historical Provider, MD  docusate sodium (COLACE) 100 MG capsule Take 100 mg by mouth daily. Stool softener    Historical Provider, MD  furosemide (LASIX) 40 MG tablet Take 1 tablet (40 mg total) by mouth daily. 08/27/15   Colon Branch, MD  ibuprofen (ADVIL,MOTRIN) 200 MG tablet Take 200 mg by mouth as needed.     Historical Provider, MD  Misc Natural Products (OSTEO BI-FLEX ADV JOINT SHIELD) TABS Take 2 tablets by mouth daily.      Historical Provider, MD  omeprazole (PRILOSEC) 20 MG capsule Take 20 mg by mouth daily.      Historical Provider, MD  timolol (BETIMOL) 0.5 % ophthalmic solution Place 1 drop into the right eye 2 (two) times daily.    Historical Provider, MD  vitamin B-12 (CYANOCOBALAMIN) 1000 MCG tablet Take 1,000 mcg by mouth daily.    Historical Provider, MD    Past Medical History  Diagnosis Date  . Cardiomyopathy, nonischemic ([place])  04/2008    Likely Tako-Tsubo. Resolved. --dx'd on cath 2005. EF 29% with minimal distal CAD (small OM1 70-80).  Echo 10/09 EF 55%;  echo 01/26/12: EF 55%, mild LAE, PASP 34.  Marland Kitchen Hypertension     SEVERE  . Idiopathic thrombocytopenic purpura (ITP)   . Granuloma annulare   . History of phlebitis   . Lower extremity edema   . Osteoporosis   . Colitis, ischemic ([place])   . Fall     with non-healing rib fractures  . Lumbar stenosis 2004    DR. MARK ROY  . AV block, 1st degree   . Complication of anesthesia     post anesthesia excessive somnulence  . CAD (coronary artery disease)     Non ST elevation 2005 ; LHC in 6/05 in setting of NSTEMI: ant apical AK and inf-apical AK, EF 29%, dOM2 70-80% (small); findings c/w Tako-Tsubo CM  . Compression fracture of C-spine ([place]) 10/10/2011    Fell at home, tx at [facility]  . Diarrhea associated with pseudomembranous colitis 6/11-17/2013    [facility]  . Bradycardia   . Dyslipidemia   . Multiple pelvic fractures ([place]) 05/2012    Dr Adaline Sill , [facility]  . Glaucoma     Past Surgical History  Procedure Laterality Date  . Cataract extraction, bilateral    . Colonoscopy w/ polypectomy    . Abdominal hysterectomy    . Rotator cuff repair    . Corneal transplant  11-27-02    left eye  . Cardiac catheterization      12/2003  . Back surgery  10/2004    LS Disc 4-5 no sx.  . Colonoscopy  09/2005    adenoma  . Rectal bleed  05-01-06    colonoscopy-ischemic colitis  . Appendectomy    . Vertebroplasty      Dr Gladstone Lighter    Family history: family history includes Breast cancer in her sister and sister; Colon cancer in her father; Coronary artery disease in her mother; Heart failure in her mother; Kidney disease in her mother; Rectal cancer in her sister; Renal cancer in her mother; Stroke in her father. There is no history of Diabetes.  Social History: Patient lives alone.  She is independent with all ADLs and IADLs.  She does not smoke.  She has no history  of dementia.       Physical Exam: BP 117/67 mmHg  Pulse 109  Temp(Src) 98.3 F (36.8 C) (Oral)  Resp 17  Ht 5\' 4"  (1.626 m)  Wt 74.844 kg (165 lb)  BMI 28.31 kg/m2  SpO2 97% General appearance: Frail elderly adult female, alert and in no acute distress.  Eyes: Anicteric, conjunctiva pink, lids and lashes normal.     ENT: No nasal deformity, discharge, or epistaxis.  OP moist without lesions.   Lymph: No cervical or supraclavicular lymphadenopathy. Skin: Warm and dry.  No jaundice.  No suspicious rashes or lesions. Cardiac: Tachycardic, nl S1-S2, no murmurs appreciated.  Capillary refill is brisk.  JVP not visible.  No LE edema.  Radial pulses 2+ and symmetric. Respiratory: Normal respiratory rate and rhythm.  CTAB without rales or wheezes. Abdomen: Abdomen soft without rigidity.  No TTP. No ascites, distension.  No CVA tenderness on either side. MSK: No deformities or effusions. Neuro: Sensorium intact and responding to questions, attention normal.  Speech is fluent.  Moves all extremities equally and with normal coordination.    Psych: Behavior appropriate.  Affect normal.  No evidence of aural or visual hallucinations or delusions.       Labs on Admission:  The metabolic panel shows normal electrolytes and renal function. Normal transaminases and bilirubin and lipase. TNI normal. Lactate normal. UA with pyuria and nitrites and bacteriuria. The complete blood count shows stable thrombocytopenia.   Radiological Exams on Admission: Personally reviewed: Dg Chest 2 View  09/23/2015  CLINICAL DATA:  Cough with fever EXAM: CHEST  2 VIEW COMPARISON:  07/28/2013 FINDINGS: Chronic hyperinflation. There is no edema, consolidation, effusion, or pneumothorax. Chronic borderline cardiomegaly and aortic tortuosity. Moderate hiatal hernia. Remote right rib fractures. No acute osseous finding. IMPRESSION: Stable.  No evidence of acute cardiopulmonary disease. Electronically Signed   By:  Monte Fantasia M.D.   On: 09/23/2015 21:01    EKG: Independently reviewed. Rate 100, QTc 451, no ST changes.    Assessment/Plan 1. Acute pyelonephritis:  This is new.  The patient presents with acute fever, N/V, diarrhea and flank pain.  There are no other focal findings, but UA is strongly suggestive of UTI.  C diff doubted.     -Ceftriaxone 1g daily for 7 days -IVF -Follow urine culture -Rule out influenza   2. ITP, chronic:  Appears stable. -Trend CBC -SCDs for VTE ppx  3. HTN:  -Continue home ACE, BB -Hold home furosemide for now  4. CKD stage 3:  At baseline  5. Chronic diastolic CHF and history of Takotsubo:  Clinically euvolemic  6. History of AoCD and pernicious anemia:  Stable -Trend CBC     DVT PPx: SCDs Diet: Heart healthy Consultants: None Code Status: DO NOT RESUSCITATE Family Communication: None present  Medical decision making: What exists of the patient's previous chart was reviewed in depth and the case was discussed with Irena Cords, PA-C. Patient seen 4:23 AM on 09/24/2015.  Disposition Plan:  I recommend admission to med surg bed, inpatient status.  Clinical condition: stable.  Anticipate treatment with IV antibiotics for 2-3 days and then discharge to SNF or home pending PT evaluation.      Edwin Dada [facility] Pager [phone]

## 2015-09-25 DIAGNOSIS — I10 Essential (primary) hypertension: Secondary | ICD-10-CM

## 2015-09-25 DIAGNOSIS — N183 Chronic kidney disease, stage 3 (moderate): Secondary | ICD-10-CM

## 2015-09-25 DIAGNOSIS — D693 Immune thrombocytopenic purpura: Secondary | ICD-10-CM

## 2015-09-25 DIAGNOSIS — K30 Functional dyspepsia: Secondary | ICD-10-CM | POA: Diagnosis present

## 2015-09-25 DIAGNOSIS — R1013 Epigastric pain: Secondary | ICD-10-CM

## 2015-09-25 DIAGNOSIS — D510 Vitamin B12 deficiency anemia due to intrinsic factor deficiency: Secondary | ICD-10-CM

## 2015-09-25 DIAGNOSIS — N189 Chronic kidney disease, unspecified: Secondary | ICD-10-CM

## 2015-09-25 DIAGNOSIS — N179 Acute kidney failure, unspecified: Secondary | ICD-10-CM | POA: Diagnosis present

## 2015-09-25 DIAGNOSIS — I5032 Chronic diastolic (congestive) heart failure: Secondary | ICD-10-CM

## 2015-09-25 DIAGNOSIS — N12 Tubulo-interstitial nephritis, not specified as acute or chronic: Secondary | ICD-10-CM

## 2015-09-25 LAB — BASIC METABOLIC PANEL
Anion gap: 8 (ref 5–15)
BUN: 33 mg/dL — ABNORMAL HIGH (ref 6–20)
CALCIUM: 7.1 mg/dL — AB (ref 8.9–10.3)
CO2: 21 mmol/L — ABNORMAL LOW (ref 22–32)
CREATININE: 1.1 mg/dL — AB (ref 0.44–1.00)
Chloride: 113 mmol/L — ABNORMAL HIGH (ref 101–111)
GFR, EST AFRICAN AMERICAN: 50 mL/min — AB (ref >60)
GFR, EST NON AFRICAN AMERICAN: 43 mL/min — AB (ref >60)
Glucose, Bld: 91 mg/dL (ref 65–99)
Potassium: 3.7 mmol/L (ref 3.5–5.1)
SODIUM: 142 mmol/L (ref 135–145)

## 2015-09-25 LAB — CBC
HCT: 29.8 % — ABNORMAL LOW (ref 36.0–46.0)
Hemoglobin: 10.2 g/dL — ABNORMAL LOW (ref 12.0–15.0)
MCH: 31.8 pg (ref 26.0–34.0)
MCHC: 34.2 g/dL (ref 30.0–36.0)
MCV: 92.8 fL (ref 78.0–100.0)
PLATELETS: 37 10*3/uL — AB (ref 150–400)
RBC: 3.21 MIL/uL — ABNORMAL LOW (ref 3.87–5.11)
RDW: 13.9 % (ref 11.5–15.5)
WBC: 3.3 10*3/uL — AB (ref 4.0–10.5)

## 2015-09-25 MED ORDER — PANTOPRAZOLE SODIUM 40 MG PO TBEC
40.0000 mg | DELAYED_RELEASE_TABLET | Freq: Every day | ORAL | Status: DC
  Administered 2015-09-25 – 2015-09-27 (×3): 40 mg via ORAL
  Filled 2015-09-25 (×3): qty 1

## 2015-09-25 MED ORDER — ALUM & MAG HYDROXIDE-SIMETH 200-200-20 MG/5ML PO SUSP
15.0000 mL | ORAL | Status: DC | PRN
  Administered 2015-09-25: 15 mL via ORAL

## 2015-09-25 NOTE — Progress Notes (Signed)
[facility] Progress Note  Patient: [name] [id number]   PCP: Kathlene November, MD DOB: [birth year]   DOA: 09/23/2015   DOS: 09/25/2015   Date of Service: the patient was seen and examined on 09/25/2015  Subjective: pt has been having indigestion this AM, no shortness of breath or chest pain, no diarrhea or active bleed, no flank pain.  Nutrition: tolerating oral diet  Brief hospital course: Patient was admitted on 09/23/2015, with complaint of nausea and vomiting with pain, was found to have UTI. Currently further plan is follow urine culture.  Assessment and Plan: 1. Pyelonephritis Present with fever nausea vomiting diarrhea and flank pain. Had evidence of pyuria. Urine culture currently pending. Chest x-ray and influenza PCR is negative. Currently on ceftriaxone, we will transition her to Keflex.  2. Chronic ITP. We will continue to closely monitor platelets. No active bleeding reported. Transfuse if less than 20. At present no evidence of acute worsening and appears to be likely dilutional  3. Acute on chronic kidney injury. Chronic kidney disease stage III. Renal functions appears to be stabilizing. We'll hold IV fluids at present avoid nephrotoxic medications.  4. Chronic diastolic dysfunction. Does not appear to be volume overloaded. What holding Lasix. Probably will be using when necessary. Monitor ins and outs and daily weight  5. Essential hypertension.  Principal Problem:   Pyelonephritis Active Problems:   *ANEMIA, PERNICIOUS   *Essential hypertension   *ITP (idiopathic thrombocytopenic purpura)   *Diastolic CHF, chronic   *CKD (chronic kidney disease) stage 3, GFR 30-59 ml/min   Acute-on-chronic kidney injury ([place])   Indigestion   Activity: request nursing to ambulate and if weak will need PT Bowel regimen: last BM 09/24/2015 DVT Prophylaxis: mechanical compression device Nutrition: tolerating oral diet Advance goals of care discussion:  DNR  HPI: As per the H and P dictated on admission, "[name] is a 82 y.o. female with a past medical history significant for HTN, ITP, diastolic CHF and hx of Takotsubo, hx of Cdiff, and pernicious anemia who presents with 1d nausea and vomiting and fever.  The patient was in her usual state of health until the day before admission when she developed malaise, nausea and vomiting, left flank pain and frequent watery crampy bowel movements. She couldn't keep anything down and felt that she had a fever, and so she came to the ER.   In the ED, the patient was febrile to 101.30F, tachycadic, but with normal BP. Na 140, K 4.3, Cr 1.36, AST/ALT normal, WBC 5, Hgb 12.9, Plts 53, lactate normal. UA showed full field WBC and bacteria, and so antibiotics were administered and she was admitted for UTI/pyelonephritis without sepsis.  She had no previous dysuria or urinary urgency, only flank pain and chronic urinary frequency. She had no preceding respiratory symptoms. She had no hematochezia and denies mucus like BMs like when she had C diff before. No recent antibiotics use. No recent food exposures." Procedures: none Consultants: none Antibiotics: Anti-infectives    Start     Dose/Rate Route Frequency Ordered Stop   09/24/15 2000  cefTRIAXone (ROCEPHIN) 1 g in dextrose 5 % 50 mL IVPB     1 g 100 mL/hr over 30 Minutes Intravenous Every 24 hours 09/24/15 0435     09/23/15 2300  cefTRIAXone (ROCEPHIN) 1 g in dextrose 5 % 50 mL IVPB     1 g 100 mL/hr over 30 Minutes Intravenous  Once 09/23/15 2254 09/24/15 0014      Family  Communication: no family was present at bedside, at the time of interview.   Disposition:  Expected discharge date:09/26/2015 Barriers to safe discharge: physical therapy eval   Intake/Output Summary (Last 24 hours) at 09/25/15 0920 Last data filed at 09/25/15 0500  Gross per 24 hour  Intake 2112.5 ml  Output   1401 ml  Net  711.5 ml   Filed Weights    09/23/15 1906  Weight: 74.844 kg (165 lb)    Objective: Physical Exam: Filed Vitals:   09/24/15 0524 09/24/15 1456 09/24/15 2143 09/25/15 0540  BP: 104/57 130/67 120/66 133/59  Pulse: 102 68 104 66  Temp: 97.5 F (36.4 C) 98.2 F (36.8 C) 98.7 F (37.1 C) 98.3 F (36.8 C)  TempSrc: Oral Oral Oral Oral  Resp: 18 18 18 18   Height:      Weight:      SpO2: 95% 97% 95% 96%     General: Appear in mild distress, mp Rash; Oral Mucosa moist. Cardiovascular: S1 and S2 Present, no Murmur, no JVD Respiratory: Bilateral Air entry present and Clear to Auscultation, no Crackles, no wheezes Abdomen: Bowel Sound present, Soft and no tenderness Extremities: no Pedal edema, no calf tenderness Neurology: Grossly no focal neuro deficit.  Data Reviewed: CBC:  Recent Labs Lab 09/23/15 2028 09/24/15 0539 09/25/15 0448  WBC 5.0 3.2* 3.3*  NEUTROABS 4.3  --   --   HGB 12.9 10.8* 10.2*  HCT 38.0 32.7* 29.8*  MCV 94.3 96.5 92.8  PLT 53* 47* 37*   Basic Metabolic Panel:  Recent Labs Lab 09/23/15 2028 09/24/15 0539 09/25/15 0448  NA 140 142 142  K 4.3 3.8 3.7  CL 107 111 113*  CO2 24 22 21*  GLUCOSE 166* 107* 91  BUN 33* 35* 33*  CREATININE 1.36* 1.43* 1.10*  CALCIUM 8.2* 7.5* 7.1*   Liver Function Tests:  Recent Labs Lab 09/23/15 2028  AST 23  ALT 12*  ALKPHOS 73  BILITOT 1.1  PROT 6.2*  ALBUMIN 3.7    Recent Labs Lab 09/23/15 2028  LIPASE 25   No results for input(s): AMMONIA in the last 168 hours.  Cardiac Enzymes:  Recent Labs Lab 09/23/15 2028  TROPONINI <0.03    BNP (last 3 results) No results for input(s): BNP in the last 8760 hours.  CBG: No results for input(s): GLUCAP in the last 168 hours.  No results found for this or any previous visit (from the past 240 hour(s)).   Studies: No results found.   Scheduled Meds: . benazepril  20 mg Oral Daily  . carvedilol  25 mg Oral BID WC  . cefTRIAXone (ROCEPHIN)  IV  1 g Intravenous Q24H  .  pantoprazole  40 mg Oral Daily   Continuous Infusions:  PRN Meds: acetaminophen **OR** acetaminophen, alum & mag hydroxide-simeth  Time spent: 30 minutes  Author: Berle Mull, MD [facility] Pager: [phone] 09/25/2015 9:20 AM  If 7PM-7AM, please contact night-coverage at [email or web address], password [facility]

## 2015-09-26 LAB — BASIC METABOLIC PANEL
Anion gap: 8 (ref 5–15)
BUN: 20 mg/dL (ref 6–20)
CHLORIDE: 112 mmol/L — AB (ref 101–111)
CO2: 21 mmol/L — AB (ref 22–32)
CREATININE: 0.95 mg/dL (ref 0.44–1.00)
Calcium: 7.3 mg/dL — ABNORMAL LOW (ref 8.9–10.3)
GFR calc non Af Amer: 51 mL/min — ABNORMAL LOW (ref >60)
GFR, EST AFRICAN AMERICAN: 59 mL/min — AB (ref >60)
GLUCOSE: 108 mg/dL — AB (ref 65–99)
Potassium: 3.9 mmol/L (ref 3.5–5.1)
Sodium: 141 mmol/L (ref 135–145)

## 2015-09-26 LAB — MAGNESIUM: Magnesium: 1.6 mg/dL — ABNORMAL LOW (ref 1.7–2.4)

## 2015-09-26 MED ORDER — ONDANSETRON HCL 4 MG/2ML IJ SOLN
4.0000 mg | Freq: Four times a day (QID) | INTRAMUSCULAR | Status: DC | PRN
  Administered 2015-09-26: 4 mg via INTRAVENOUS
  Filled 2015-09-26: qty 2

## 2015-09-26 MED ORDER — MAGNESIUM SULFATE 2 GM/50ML IV SOLN
2.0000 g | Freq: Once | INTRAVENOUS | Status: AC
  Administered 2015-09-26: 2 g via INTRAVENOUS
  Filled 2015-09-26: qty 50

## 2015-09-26 NOTE — Progress Notes (Signed)
[facility] Progress Note  Patient: [name] [id number]   PCP: Daisy November, MD DOB: 1923/03/14   DOA: 09/23/2015   DOS: 09/26/2015   Date of Service: the patient was seen and examined on 09/26/2015  Subjective: Patient denies any current complaint but continues to complain of fatigue and tiredness. Was dizzy yesterday while ambulating  Nutrition: tolerating oral diet  Brief hospital course: Patient was admitted on 09/23/2015, with complaint of nausea and vomiting with pain, was found to have UTI. Currently further plan is follow urine culture.  Assessment and Plan: 1. Pyelonephritis gram-negative rods in the urine Present with fever nausea vomiting diarrhea and flank pain. Had evidence of pyuria. Sensitivity pending. Chest x-ray and influenza PCR is negative. Currently on ceftriaxone, we will transition her to Keflex., Await sensitivity prior to discharge  2. Chronic ITP. We will continue to closely monitor platelets. No active bleeding reported. Transfuse if less than 20. At present no evidence of acute worsening and appears to be likely dilutional  3. Acute on chronic kidney injury. Chronic kidney disease stage III. Renal functions appears to be stabilizing. We'll continue to hold IV fluids at present avoid nephrotoxic medications.  4. Chronic diastolic dysfunction. Does not appear to be volume overloaded. What holding Lasix. Probably will be using when necessary. Monitor ins and outs and daily weight  5. Essential hypertension. Her pressure mildly elevated this morning. Orthostatics are negative. Continue to closely monitor  Activity: request nursing to ambulate and if weak will need PT Bowel regimen: last BM 09/26/2015, she mentions she is having daily loose one bowel movement DVT Prophylaxis: mechanical compression device Nutrition: tolerating oral diet Advance goals of care discussion: DNR  HPI: As per the H and P dictated on admission, "[name]  is a 82 y.o. female with a past medical history significant for HTN, ITP, diastolic CHF and hx of Takotsubo, hx of Cdiff, and pernicious anemia who presents with 1d nausea and vomiting and fever.  The patient was in her usual state of health until the day before admission when she developed malaise, nausea and vomiting, left flank pain and frequent watery crampy bowel movements. She couldn't keep anything down and felt that she had a fever, and so she came to the ER.   In the ED, the patient was febrile to 101.26F, tachycadic, but with normal BP. Na 140, K 4.3, Cr 1.36, AST/ALT normal, WBC 5, Hgb 12.9, Plts 53, lactate normal. UA showed full field WBC and bacteria, and so antibiotics were administered and she was admitted for UTI/pyelonephritis without sepsis.  She had no previous dysuria or urinary urgency, only flank pain and chronic urinary frequency. She had no preceding respiratory symptoms. She had no hematochezia and denies mucus like BMs like when she had C diff before. No recent antibiotics use. No recent food exposures." Procedures: none Consultants: none Antibiotics: Anti-infectives    Start     Dose/Rate Route Frequency Ordered Stop   09/24/15 2000  cefTRIAXone (ROCEPHIN) 1 g in dextrose 5 % 50 mL IVPB     1 g 100 mL/hr over 30 Minutes Intravenous Every 24 hours 09/24/15 0435     09/23/15 2300  cefTRIAXone (ROCEPHIN) 1 g in dextrose 5 % 50 mL IVPB     1 g 100 mL/hr over 30 Minutes Intravenous  Once 09/23/15 2254 09/24/15 0014      Family Communication: no family was present at bedside, at the time of interview.   Disposition:  Expected discharge date:09/27/2015  Barriers to safe discharge: Culture sensitivity   Intake/Output Summary (Last 24 hours) at 09/26/15 1735 Last data filed at 09/26/15 1355  Gross per 24 hour  Intake    120 ml  Output   1650 ml  Net  -1530 ml   Filed Weights   09/23/15 1906  Weight: 74.844 kg (165 lb)    Objective: Physical Exam: Filed  Vitals:   09/25/15 2111 09/26/15 0458 09/26/15 0700 09/26/15 1354  BP: 133/70 140/80  134/62  Pulse: 100 100  60  Temp: 98.2 F (36.8 C) 98.6 F (37 C) 98.2 F (36.8 C) 98 F (36.7 C)  TempSrc: Oral Oral Oral Oral  Resp: 16 16  16   Height:      Weight:      SpO2: 97% 97% 99% 99%    General: Appear in mild distress, mp Rash; Oral Mucosa moist. Cardiovascular: S1 and S2 Present, no Murmur, no JVD Respiratory: Bilateral Air entry present and Clear to Auscultation, no Crackles, no wheezes Abdomen: Bowel Sound present, Soft and no tenderness Extremities: no Pedal edema, no calf tenderness  Data Reviewed: CBC:  Recent Labs Lab 09/23/15 2028 09/24/15 0539 09/25/15 0448  WBC 5.0 3.2* 3.3*  NEUTROABS 4.3  --   --   HGB 12.9 10.8* 10.2*  HCT 38.0 32.7* 29.8*  MCV 94.3 96.5 92.8  PLT 53* 47* 37*   Basic Metabolic Panel:  Recent Labs Lab 09/23/15 2028 09/24/15 0539 09/25/15 0448 09/26/15 0431  NA 140 142 142 141  K 4.3 3.8 3.7 3.9  CL 107 111 113* 112*  CO2 24 22 21* 21*  GLUCOSE 166* 107* 91 108*  BUN 33* 35* 33* 20  CREATININE 1.36* 1.43* 1.10* 0.95  CALCIUM 8.2* 7.5* 7.1* 7.3*  MG  --   --   --  1.6*   Liver Function Tests:  Recent Labs Lab 09/23/15 2028  AST 23  ALT 12*  ALKPHOS 73  BILITOT 1.1  PROT 6.2*  ALBUMIN 3.7    Recent Labs Lab 09/23/15 2028  LIPASE 25   No results for input(s): AMMONIA in the last 168 hours.  Cardiac Enzymes:  Recent Labs Lab 09/23/15 2028  TROPONINI <0.03    BNP (last 3 results) No results for input(s): BNP in the last 8760 hours.  CBG: No results for input(s): GLUCAP in the last 168 hours.  Recent Results (from the past 240 hour(s))  Urine culture     Status: None (Preliminary result)   Collection Time: 09/23/15 10:10 PM  Result Value Ref Range Status   Specimen Description URINE, RANDOM  Final   Special Requests NONE  Final   Culture   Final    >=100,000 COLONIES/mL GRAM NEGATIVE RODS IDENTIFICATION  AND SUSCEPTIBILITIES TO FOLLOW Performed at [facility]    Report Status PENDING  Incomplete     Studies: No results found.   Scheduled Meds: . benazepril  20 mg Oral Daily  . carvedilol  25 mg Oral BID WC  . cefTRIAXone (ROCEPHIN)  IV  1 g Intravenous Q24H  . pantoprazole  40 mg Oral Daily   Continuous Infusions:  PRN Meds: acetaminophen **OR** acetaminophen, alum & mag hydroxide-simeth  Time spent: 30 minutes  Author: Berle Mull, MD [facility] Pager: [phone] 09/26/2015 5:35 PM  If 7PM-7AM, please contact night-coverage at [email or web address], password [email or web address]

## 2015-09-26 NOTE — Evaluation (Addendum)
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 Pt currently with functional limitations due to the deficits listed below (see PT Problem List).  Pt will benefit from skilled PT to increase their independence and safety with mobility to allow discharge to the venue listed below.   Pt able to tolerate good distance of ambulation today and reports overall feeling better since admission.  Pt does not believe she needs HHPT, which this PT agrees with, as pt is close to her baseline and has family assist if needed.    Follow Up Recommendations No PT follow up;Supervision - Intermittent    Equipment Recommendations  None recommended by PT    Recommendations for Other Services       Precautions / Restrictions Precautions Precautions: None      Mobility  Bed Mobility Overal bed mobility: Needs Assistance Bed Mobility: Supine to Sit     Supine to sit: Supervision        Transfers Overall transfer level: Needs assistance Equipment used: Straight cane Transfers: Sit to/from Stand Sit to Stand: Min guard            Ambulation/Gait Ambulation/Gait assistance: Min guard Ambulation Distance (Feet): 240 Feet Assistive device: Straight cane Gait Pattern/deviations: Step-through pattern     General Gait Details: slow but steady gait, pt denies any dizziness today, reports feeling better with mobility compared to yesterday  Stairs            Wheelchair Mobility    Modified Rankin (Stroke Patients Only)       Balance Overall balance assessment: Needs assistance          Standing balance support: Single extremity supported;During functional activity Standing balance-Leahy Scale: Fair Standing balance comment: able to stand without support however uses SPC for improved steadiness with mobility, reports no falls in the past 4 years                             Pertinent Vitals/Pain Pain Assessment: No/denies pain    Home Living Family/patient expects to be discharged to:: Private residence Living Arrangements: Alone     Home Access: Level entry     Home Layout: One level Home Equipment: Walker - 2 wheels;Cane - single point Additional Comments: son and daughter in law lives    Prior Function Level of Independence: Independent with assistive device(s)         Comments: pt uses SPC     Hand Dominance        Extremity/Trunk Assessment               Lower Extremity Assessment: Generalized weakness         Communication   Communication: No difficulties  Cognition Arousal/Alertness: Awake/alert Behavior During Therapy: WFL for tasks assessed/performed Overall Cognitive Status: Within Functional Limits for tasks assessed                      General Comments      Exercises        Assessment/Plan    PT Assessment Patient needs continued PT services  PT Diagnosis Generalized weakness  PT Problem List Decreased strength;Decreased activity tolerance;Decreased mobility  PT Treatment Interventions DME instruction;Gait training;Functional mobility training;Patient/family education;Therapeutic activities;Therapeutic exercise   PT Goals (Current goals can be found in the Care Plan section) Acute Rehab PT Goals PT Goal Formulation: With patient Time For Goal Achievement: 10/03/15 Potential to Achieve Goals: Good    Frequency Min 3X/week   Barriers to discharge        Co-evaluation               End of Session Equipment Utilized During Treatment: Gait belt Activity Tolerance: Patient  tolerated treatment well Patient left: in chair;with call bell/phone within reach Nurse Communication: Mobility status         Time: [id number] PT Time Calculation (min) (ACUTE ONLY): 16 min   Charges:   PT Evaluation $PT Eval Low Complexity: 1 Procedure     PT G Codes:        Javeah Loeza,KATHrine E 09/26/2015, 12:32 PM Carmelia Bake, PT, DPT 09/26/2015 Pager: [phone]

## 2015-09-27 ENCOUNTER — Other Ambulatory Visit: Payer: Self-pay | Admitting: *Deleted

## 2015-09-27 LAB — URINE CULTURE: Culture: >=100000

## 2015-09-27 MED ORDER — CEPHALEXIN 500 MG PO CAPS
500.0000 mg | ORAL_CAPSULE | Freq: Four times a day (QID) | ORAL | Status: AC
Start: 2015-09-27 — End: 2015-09-29

## 2015-09-27 MED ORDER — FUROSEMIDE 40 MG PO TABS: 40.0000 mg | ORAL_TABLET | Freq: Every day | ORAL | Status: DC | PRN

## 2015-09-27 MED ORDER — CEPHALEXIN 500 MG PO CAPS
500.0000 mg | ORAL_CAPSULE | Freq: Four times a day (QID) | ORAL | Status: DC
  Administered 2015-09-27: 500 mg via ORAL
  Filled 2015-09-27 (×4): qty 1

## 2015-09-27 NOTE — Consult Note (Signed)
   [facility] CM Inpatient Consult   09/27/2015  HEILYN GAGNE 01-02-1923 [id number]   Patient evaluated for long-term disease management services with [place] [facility] program. Martin Majestic to bedside to discuss and offer [place] Management services. Patient is agreeable and written consent signed. Explained to patient that she will receive post hospital transition of care calls and will be evaluated for monthly home visits. Confirmed Primary Care MD as Dr. Larose Kells.  Confirmed best contact number as [phone]. Discussed that she remembered being followed by [place] [facility] several years ago. She also remembered recently declining [facility] Care Management several months ago. She is agreeable today as she states follow up "would not hurt". She has history of CHF as well. Lives alone but has supportive son. She denies having issues with transportation or affording medications. Left [facility] Care Management packet and contact information at bedside. Will make inpatient RNCM aware that patient will be followed by [place] [facility] post hospital discharge. Will request to be assigned to [place] for transition of care program. Ms. Konz likely to discharge home today.    Marthenia Rolling, MSN-Ed, RN,BSN [facility] Liaison [phone]

## 2015-09-27 NOTE — Progress Notes (Signed)
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  Assisted OOB to amb a greate distance in hallway.  Pt has her own SPC.    Follow Up Recommendations  No PT follow up     Equipment Recommendations  None recommended by PT    Recommendations for Other Services       Precautions / Restrictions Precautions Precautions: None Restrictions Weight Bearing Restrictions: No    Mobility  Bed Mobility Overal bed mobility: Modified Independent             General bed mobility comments: increased time and use of rail  Transfers Overall transfer level: Needs assistance Equipment used: None Transfers: Sit to/from Stand Sit to Stand: Supervision         General transfer comment: good safety cognition and use of hands to steady self  Ambulation/Gait Ambulation/Gait assistance: Supervision;Min guard Ambulation Distance (Feet): 275 Feet Assistive device: Straight cane Gait Pattern/deviations: Step-through pattern Gait velocity: slow but functional   General Gait Details: pt feeling "better but still tiered".     Stairs            Wheelchair Mobility    Modified Rankin (Stroke Patients Only)       Balance                                    Cognition Arousal/Alertness: Awake/alert Behavior During Therapy: WFL for tasks assessed/performed Overall Cognitive Status: Within Functional Limits for tasks assessed                      Exercises      General Comments        Pertinent Vitals/Pain Pain Assessment: No/denies pain    Home Living                       Prior Function            PT Goals (current goals can now be found in the care plan section) Progress towards PT goals: Progressing toward goals    Frequency  Min 3X/week    PT Plan Current plan remains appropriate    Co-evaluation             End of Session Equipment Utilized During Treatment: Gait belt Activity Tolerance: Patient tolerated treatment well Patient left: in chair;with call bell/phone within reach     Time: 1040-1055 PT Time Calculation (min) (ACUTE ONLY): 15 min  Charges:  $Gait Training: 8-22 mins                    G Codes:      Rica Koyanagi  PTA [facility]  Acute  Rehab Pager      [phone]

## 2015-09-27 NOTE — Care Management Note (Signed)
Case Management Note  Patient Details  Name: [name] MRN: [id number] Date of Birth: Nov 29, 1922  Subjective/Objective:     Admitted with Acute pyelonephritis               Action/Plan: Discharge planning, no HH needs identified. Patient states her son will be in to assist with transportation home.   Expected Discharge Date:                  Expected Discharge Plan:  Home/Self Care  In-House Referral:  NA  Discharge planning Services  CM Consult  Post Acute Care Choice:  NA Choice offered to:  NA  DME Arranged:  N/A DME Agency:  NA  HH Arranged:  NA HH Agency:  NA  Status of Service:  Completed, signed off  Medicare Important Message Given:    Date Medicare IM Given:    Medicare IM give by:    Date Additional Medicare IM Given:    Additional Medicare Important Message give by:     If discussed at [place] of Stay Meetings, dates discussed:    Additional Comments:  Guadalupe Maple, RN 09/27/2015, 11:06 AM [phone]

## 2015-09-30 ENCOUNTER — Telehealth: Payer: Self-pay

## 2015-09-30 ENCOUNTER — Other Ambulatory Visit: Payer: Self-pay | Admitting: *Deleted

## 2015-09-30 NOTE — Discharge Summary (Signed)
[facility] Discharge Summary   Patient: [name] [id number]   PCP: Kathlene November, MD DOB: August 13, 1922   Date of admission: 09/23/2015   Date of discharge: 09/27/2015    Discharge Diagnoses:  Principal Problem:   Pyelonephritis Active Problems:   *ANEMIA, PERNICIOUS   *Essential hypertension   *ITP (idiopathic thrombocytopenic purpura)   *Diastolic CHF, chronic   *CKD (chronic kidney disease) stage 3, GFR 30-59 ml/min   Acute-on-chronic kidney injury ([place])   Indigestion  Recommendations for Outpatient Follow-up:  1.  Follow-up with PCP in one week With repeat CBC and BMP  Follow-up Information    Follow up with Kathlene November, MD. Schedule an appointment as soon as possible for a visit in 1 week.   Specialty:  Internal Medicine   Contact information:   [address] [place] [address] [phone]      Diet recommendation: Regular diet  Activity: The patient is advised to gradually reintroduce usual activities.  Discharge Condition: good  History of present illness: As per the H and P dictated on admission, "[name] is a 82 y.o. female with a past medical history significant for HTN, ITP, diastolic CHF and hx of Takotsubo, hx of Cdiff, and pernicious anemia who presents with 1d nausea and vomiting and fever.  The patient was in her usual state of health until the day before admission when she developed malaise, nausea and vomiting, left flank pain and frequent watery crampy bowel movements. She couldn't keep anything down and felt that she had a fever, and so she came to the ER.   In the ED, the patient was febrile to 101.44F, tachycadic, but with normal BP. Na 140, K 4.3, Cr 1.36, AST/ALT normal, WBC 5, Hgb 12.9, Plts 53, lactate normal. UA showed full field WBC and bacteria, and so antibiotics were administered and she was admitted for UTI/pyelonephritis without sepsis.  She had no previous dysuria or urinary urgency, only flank pain  and chronic urinary frequency. She had no preceding respiratory symptoms. She had no hematochezia and denies mucus like BMs like when she had C diff before. No recent antibiotics use. No recent food exposures."  Hospital Course:  Summary of her active problems in the hospital is as following. 1. Pyelonephritis Escherichia coli and Klebsiella pneumoniae Present with fever nausea vomiting diarrhea and flank pain. Had evidence of pyuria. Urine culture grows Escherichia coli and Klebsiella pneumoniae pansensitive. Chest x-ray and influenza PCR is negative. Currently on ceftriaxone, we will transition her to Keflex. treatment course for total of 7 days  2. Chronic  ITP. No active bleeding reported. At present no evidence of acute worsening and appears to be likely dilutional. Check CBC in 1 week. Continue hematology follow-up as scheduled  3. Acute on chronic kidney injury. Chronic kidney disease stage III. Renal functions appears to be stabilizing. avoid nephrotoxic medications.  4. Chronic diastolic dysfunction. Does not appear to be volume overloaded. Lasix will be changed to only when necessary. Monitor ins and outs and daily weight  5. Essential hypertension. Stable, resume home medications Check BMP in 1 week  All other chronic medical condition were stable during the hospitalization.  Patient was seen by physical therapy, who recommended no further physical therapy requirement for the patient. On the day of the discharge the patient's vitals remained stable, and no other acute medical condition were reported by patient. the patient was felt safe to be discharge at home with family support.  Procedures and  Results:  None   Consultations:  None  DISCHARGE MEDICATION: Discharge Medication List as of 09/27/2015  1:52 PM    START taking these medications   Details  cephALEXin (KEFLEX) 500 MG capsule Take 1 capsule (500 mg total) by mouth every 6 (six) hours., Starting  09/27/2015, Until Sun 09/29/15, Normal      CONTINUE these medications which have CHANGED   Details  furosemide (LASIX) 40 MG tablet Take 1 tablet (40 mg total) by mouth daily as needed for fluid or edema., Starting 09/27/2015, Until Discontinued, Normal      CONTINUE these medications which have NOT CHANGED   Details  benazepril (LOTENSIN) 20 MG tablet Take 1 tablet (20 mg total) by mouth daily., Starting 08/27/2015, Until Discontinued, Normal    beta carotene w/minerals (OCUVITE) tablet Take 1 tablet by mouth daily. , Until Discontinued, Historical Med    brimonidine (ALPHAGAN) 0.2 % ophthalmic solution Place 1 drop into the right eye 2 (two) times daily.  , Until Discontinued, Historical Med    Calcium Carb-Cholecalciferol (CALCIUM 600 + D PO) Take 1 tablet by mouth daily., Until Discontinued, Historical Med    carboxymethylcellulose (REFRESH TEARS) 0.5 % SOLN Place 1 drop into both eyes 2 (two) times daily. , Until Discontinued, Historical Med    carvedilol (COREG) 25 MG tablet Take 1 tablet (25 mg total) by mouth 2 (two) times daily with a meal., Starting 08/27/2015, Until Discontinued, Normal    Cholecalciferol (VITAMIN D3) 3000 UNITS TABS Take 1 tablet by mouth daily., Until Discontinued, Historical Med    denosumab (PROLIA) 60 MG/ML SOLN injection Inject 60 mg into the skin every 6 (six) months. Administer in upper arm, thigh, or abdomen, Until Discontinued, Historical Med    docusate sodium (COLACE) 100 MG capsule Take 100 mg by mouth daily as needed for mild constipation. Stool softener, Until Discontinued, Historical Med    Misc Natural Products (OSTEO BI-FLEX ADV JOINT SHIELD) TABS Take 1 tablet by mouth daily. , Until Discontinued, Historical Med    neomycin-polymyxin b-dexamethasone (MAXITROL) 3.5-10000-0.1 OINT Place 1 application into both eyes daily as needed (dry eyes). , Starting 09/16/2015, Until Discontinued, Historical Med    omeprazole (PRILOSEC) 20 MG capsule Take 20  mg by mouth daily.  , Until Discontinued, Historical Med    timolol (BETIMOL) 0.5 % ophthalmic solution Place 1 drop into the right eye 2 (two) times daily., Until Discontinued, Historical Med    vitamin B-12 (CYANOCOBALAMIN) 1000 MCG tablet Take 1,000 mcg by mouth daily., Until Discontinued, Historical Med    diphenhydrAMINE (BENADRYL) 25 MG tablet Take 25 mg by mouth at bedtime., Until Discontinued, Historical Med      STOP taking these medications     ibuprofen (ADVIL,MOTRIN) 200 MG tablet        Allergies  Allergen Reactions  . Morphine And Related Anaphylaxis and Nausea Only    Per family violently ill  . Oxycodone     Dizziness   . Penicillins     joint edema Has patient had a PCN reaction causing immediate rash, facial/tongue/throat swelling, SOB or lightheadedness with hypotension: Unknown Has patient had a PCN reaction causing severe rash involving mucus membranes or skin necrosis: unknown Has patient had a PCN reaction that required hospitalization No Has patient had a PCN reaction occurring within the last 10 years: No If all of the above answers are "NO", then may proceed with Cephalosporin use.   . Colchicine     REACTION: hair loss  .  Hydroxychloroquine Other (See Comments)    unknown  . Norvasc [Amlodipine Besylate]   . Ofloxacin Other (See Comments)    unknown  . Streptomycin Other (See Comments)    Unknown  . Tramadol Itching  . Alendronate Sodium Palpitations   Discharge Instructions    AMB Referral to [place] Management    Complete by:  As directed   Please assign to [facility] for transition of care. History of CHF, HTN. Active with [facility] Care Management in the distant past. Written consent obtained. Likely discharge home today 09/27/15. Please call with questions .Marthenia Rolling, [place], RN,BSN-THN [facility] [id number]  Reason for consult:  Please assign to [facility] RNCM  Diagnoses of:  Heart Failure  Expected date of contact:   1-3 days (reserved for hospital discharges)     Diet - low sodium heart healthy    Complete by:  As directed      Discharge instructions    Complete by:  As directed   It is important that you read following instructions as well as go over your medication list with RN to help you understand your care after this hospitalization.  Discharge Instructions: Please follow-up with PCP in one week You had ESCHERICHIA COLI and KLEBSIELLA PNEUMONIAE infection in urine.  Please request your primary care physician to go over all Hospital Tests and Procedure/Radiological results at the follow up,  Please get all Hospital records sent to your PCP by signing hospital release before you go home.   You were cared for by a hospitalist during your hospital stay. If you have any questions about your discharge medications or the care you received while you were in the hospital after you are discharged, you can call the unit and ask to speak with the hospitalist on call if the hospitalist that took care of you is not available.  Once you are discharged, your primary care physician will handle any further medical issues. Please note that NO REFILLS for any discharge medications will be authorized once you are discharged, as it is imperative that you return to your primary care physician (or establish a relationship with a primary care physician if you do not have one) for your aftercare needs so that they can reassess your need for medications and monitor your lab values. You Must read complete instructions/literature along with all the possible adverse reactions/side effects for all the Medicines you take and that have been prescribed to you. Take any new Medicines after you have completely understood and accept all the possible adverse reactions/side effects. Wear Seat belts while driving. If you have smoked or chewed Tobacco in the last 2 yrs please stop smoking and/or stop any Recreational drug use.     Increase  activity slowly    Complete by:  As directed           Discharge Exam: Filed Weights   09/23/15 1906  Weight: 74.844 kg (165 lb)   Filed Vitals:   09/27/15 0521 09/27/15 1423  BP: 120/58 132/90  Pulse: 58 79  Temp: 97.5 F (36.4 C) 97.8 F (36.6 C)  Resp: 16 16   General: Appear in no distress, no Rash; Oral Mucosa moist. Cardiovascular: S1 and S2 Present, no Murmur, no JVD Respiratory: Bilateral Air entry present and Clear to Auscultation, no Crackles, no wheezes Abdomen: Bowel Sound present, Soft and no tenderness Extremities: no Pedal edema, no calf tenderness Neurology: Grossly no focal neuro deficit.  The results of significant diagnostics from this  hospitalization (including imaging, microbiology, ancillary and laboratory) are listed below for reference.    Significant Diagnostic Studies: Dg Chest 2 View  09/23/2015  CLINICAL DATA:  Cough with fever EXAM: CHEST  2 VIEW COMPARISON:  07/28/2013 FINDINGS: Chronic hyperinflation. There is no edema, consolidation, effusion, or pneumothorax. Chronic borderline cardiomegaly and aortic tortuosity. Moderate hiatal hernia. Remote right rib fractures. No acute osseous finding. IMPRESSION: Stable.  No evidence of acute cardiopulmonary disease. Electronically Signed   By: Monte Fantasia M.D.   On: 09/23/2015 21:01    Microbiology: Recent Results (from the past 240 hour(s))  Urine culture     Status: None   Collection Time: 09/23/15 10:10 PM  Result Value Ref Range Status   Specimen Description URINE, RANDOM  Final   Special Requests NONE  Final   Culture   Final    >=100,000 COLONIES/mL ESCHERICHIA COLI >=100,000 COLONIES/mL KLEBSIELLA PNEUMONIAE Performed at [facility]    Report Status 09/27/2015 FINAL  Final   Organism ID, Bacteria ESCHERICHIA COLI  Final   Organism ID, Bacteria KLEBSIELLA PNEUMONIAE  Final      Susceptibility   Escherichia coli - MIC*    AMPICILLIN <=2 SENSITIVE Sensitive     CEFAZOLIN <=4  SENSITIVE Sensitive     CEFTRIAXONE <=1 SENSITIVE Sensitive     CIPROFLOXACIN <=0.25 SENSITIVE Sensitive     GENTAMICIN <=1 SENSITIVE Sensitive     IMIPENEM <=0.25 SENSITIVE Sensitive     NITROFURANTOIN <=16 SENSITIVE Sensitive     TRIMETH/SULFA <=20 SENSITIVE Sensitive     AMPICILLIN/SULBACTAM <=2 SENSITIVE Sensitive     PIP/TAZO <=4 SENSITIVE Sensitive     * >=100,000 COLONIES/mL ESCHERICHIA COLI   Klebsiella pneumoniae - MIC*    AMPICILLIN >=32 RESISTANT Resistant     CEFAZOLIN <=4 SENSITIVE Sensitive     CEFTRIAXONE <=1 SENSITIVE Sensitive     CIPROFLOXACIN <=0.25 SENSITIVE Sensitive     GENTAMICIN <=1 SENSITIVE Sensitive     IMIPENEM <=0.25 SENSITIVE Sensitive     NITROFURANTOIN 32 SENSITIVE Sensitive     TRIMETH/SULFA <=20 SENSITIVE Sensitive     AMPICILLIN/SULBACTAM 4 SENSITIVE Sensitive     PIP/TAZO <=4 SENSITIVE Sensitive     * >=100,000 COLONIES/mL KLEBSIELLA PNEUMONIAE     Labs: CBC:  Recent Labs Lab 09/23/15 2028 09/24/15 0539 09/25/15 0448  WBC 5.0 3.2* 3.3*  NEUTROABS 4.3  --   --   HGB 12.9 10.8* 10.2*  HCT 38.0 32.7* 29.8*  MCV 94.3 96.5 92.8  PLT 53* 47* 37*   Basic Metabolic Panel:  Recent Labs Lab 09/23/15 2028 09/24/15 0539 09/25/15 0448 09/26/15 0431  NA 140 142 142 141  K 4.3 3.8 3.7 3.9  CL 107 111 113* 112*  CO2 24 22 21* 21*  GLUCOSE 166* 107* 91 108*  BUN 33* 35* 33* 20  CREATININE 1.36* 1.43* 1.10* 0.95  CALCIUM 8.2* 7.5* 7.1* 7.3*  MG  --   --   --  1.6*   Liver Function Tests:  Recent Labs Lab 09/23/15 2028  AST 23  ALT 12*  ALKPHOS 73  BILITOT 1.1  PROT 6.2*  ALBUMIN 3.7    Recent Labs Lab 09/23/15 2028  LIPASE 25   No results for input(s): AMMONIA in the last 168 hours. Cardiac Enzymes:  Recent Labs Lab 09/23/15 2028  TROPONINI <0.03   BNP (last 3 results) No results for input(s): BNP in the last 8760 hours. CBG: No results for input(s): GLUCAP in the last 168 hours. Time  spent: 30  minutes  Signed:  Berle Mull  [facility] 09/27/2015, 7:22 AM

## 2015-09-30 NOTE — Telephone Encounter (Signed)
thx

## 2015-09-30 NOTE — Telephone Encounter (Signed)
PCP: Kathlene November, MD DOB: 02-28-1923   Date of admission: 09/23/2015  Date of discharge: 09/27/2015    Discharge Diagnoses:  Principal Problem:  Pyelonephritis Active Problems:  *ANEMIA, PERNICIOUS  *Essential hypertension  *ITP (idiopathic thrombocytopenic purpura)  *Diastolic CHF, chronic  *CKD (chronic kidney disease) stage 3, GFR 30-59 ml/min  Acute-on-chronic kidney injury ([place])  Indigestion  Recommendations for Outpatient Follow-up:  1. Follow-up with PCP in one week With repeat CBC and BMP       Hospital Follow Up appt scheduled with Dr. Larose Kells on 10/07/15 at 11:30 am.     Transition Care Management Follow-up Telephone Call  How have you been since you were released from the hospital? Pt states she's feeling better.  Denies fever, chills, nausea and vomiting, and pain.  Says antibiotics seems to be upsetting stomach and feels tired, otherwise no complaints.  Pt was encouraged to eat and drink plenty of fluids.  Try yogurt to help with upset stomach and to prevent yeast infections.  Pt was also advised to increase physical activity slowly and rest when needed.     Do you understand why you were in the hospital? yes   Do you understand the discharge instructions? yes  Items Reviewed:  Medications reviewed: yes  Allergies reviewed: yes  Dietary changes reviewed: yes, low sodium diet  Referrals reviewed: n/a   Functional Questionnaire:   Activities of Daily Living (ADLs):   She states they are independent in the following: ambulation, bathing and hygiene, feeding, continence, grooming, toileting and dressing States they require assistance with the following: none   Any transportation issues/concerns?: no   Any patient concerns? no   Confirmed importance and date/time of follow-up visits scheduled: yes   Confirmed with patient if condition begins to worsen call PCP or go to the ER: yes

## 2015-09-30 NOTE — Patient Outreach (Signed)
[facility]) Care Management  09/30/2015  [name] 02-12-1923 [id number]   Transition of care (week 1)  RN attempted outreach call today however the line was busy. Will continue outreach call accordingly for possible [facility] services.  Raina Mina, RN Care Management Coordinator [place] Office [phone]

## 2015-10-03 ENCOUNTER — Other Ambulatory Visit: Payer: Self-pay | Admitting: *Deleted

## 2015-10-03 NOTE — Patient Outreach (Signed)
[place] [facility]) Care Management  10/03/2015  [name] [birth year] [id number]  Transition of care (week 1/2nd attempt)  RN attempted another outreach call however unsuccessful. There was no answer to the available contact number. RN will attempt once again next week. If remains unsuccessful will sent outreach letter accordingly.  Raina Mina, RN Care Management Coordinator [place] Office [phone]

## 2015-10-07 ENCOUNTER — Encounter: Payer: Self-pay | Admitting: Internal Medicine

## 2015-10-07 ENCOUNTER — Ambulatory Visit (INDEPENDENT_AMBULATORY_CARE_PROVIDER_SITE_OTHER): Payer: Medicare Other | Admitting: Internal Medicine

## 2015-10-07 VITALS — BP 108/72 | HR 102 | Temp 98.0°F | Ht 64.0 in | Wt 170.2 lb

## 2015-10-07 DIAGNOSIS — I10 Essential (primary) hypertension: Secondary | ICD-10-CM | POA: Diagnosis not present

## 2015-10-07 DIAGNOSIS — N10 Acute tubulo-interstitial nephritis: Secondary | ICD-10-CM

## 2015-10-07 DIAGNOSIS — Z09 Encounter for follow-up examination after completed treatment for conditions other than malignant neoplasm: Secondary | ICD-10-CM

## 2015-10-07 DIAGNOSIS — D693 Immune thrombocytopenic purpura: Secondary | ICD-10-CM | POA: Diagnosis not present

## 2015-10-07 LAB — BASIC METABOLIC PANEL
BUN: 24 mg/dL — ABNORMAL HIGH (ref 6–23)
CALCIUM: 9 mg/dL (ref 8.4–10.5)
CO2: 26 mEq/L (ref 19–32)
CREATININE: 1.01 mg/dL (ref 0.40–1.20)
Chloride: 105 mEq/L (ref 96–112)
GFR: 54.6 mL/min — AB (ref >60.00)
Glucose, Bld: 98 mg/dL (ref 70–99)
Potassium: 4.1 mEq/L (ref 3.5–5.1)
Sodium: 138 mEq/L (ref 135–145)

## 2015-10-07 LAB — CBC WITH DIFFERENTIAL/PLATELET
BASOS ABS: 0 10*3/uL (ref 0.0–0.1)
Basophils Relative: 0.5 % (ref 0.0–3.0)
EOS ABS: 0.4 10*3/uL (ref 0.0–0.7)
Eosinophils Relative: 5.9 % — ABNORMAL HIGH (ref 0.0–5.0)
HEMATOCRIT: 33.9 % — AB (ref 36.0–46.0)
Hemoglobin: 11.5 g/dL — ABNORMAL LOW (ref 12.0–15.0)
LYMPHS ABS: 1.4 10*3/uL (ref 0.7–4.0)
LYMPHS PCT: 21.7 % (ref 12.0–46.0)
MCHC: 33.9 g/dL (ref 30.0–36.0)
MCV: 93.9 fl (ref 78.0–100.0)
Monocytes Absolute: 0.6 10*3/uL (ref 0.1–1.0)
Monocytes Relative: 8.6 % (ref 3.0–12.0)
NEUTROS ABS: 4.1 10*3/uL (ref 1.4–7.7)
NEUTROS PCT: 63.3 % (ref 43.0–77.0)
PLATELETS: 67 10*3/uL — AB (ref 150.0–400.0)
RBC: 3.61 Mil/uL — AB (ref 3.87–5.11)
RDW: 14.6 % (ref 11.5–15.5)
WBC: 6.4 10*3/uL (ref 4.0–10.5)

## 2015-10-07 NOTE — Progress Notes (Signed)
Pre visit review using our clinic review tool, if applicable. No additional management support is needed unless otherwise documented below in the visit note. 

## 2015-10-07 NOTE — Patient Instructions (Signed)
GO TO THE LAB :      Get the blood work     [place] schedule your next appointment for a  Routine check When?   By June or July 2017, cancel the earlier appointment Fasting?  Yes

## 2015-10-07 NOTE — Progress Notes (Signed)
Subjective:    Patient ID: [name], female    DOB: 1923-06-01, 82 y.o.   MRN: [id number]  DOS:  10/07/2015 Type of visit - description : TCM, hospital follow-up Interval history:  Admitted 09/23/2015 4 days. Admitted with one day history of nausea, vomiting, fever. At the ER she was febrile, tachycardic, UA was consistent with UTI and was admitted with a diagnosis of pyelonephritis. UCX + Escherichia coli and Klebsiella. Rx ceftriaxone and eventually keflex which she finished.  Was recommended to get a BMP-CBC after discharge.  Chronic medical problems seem stable  last creatinine 0.9, platelets 37, hemoglobin 10.   Review of Systems since she left the hospital she is doing better, took medication as prescribed. Getting stronger. No fever chills. Appetite is good. Denies any nausea, vomiting, diarrhea. No blood in the stools, no abdominal pain. She had some left flank pain which is resolved. History of ITP with low platelets: Denies any rash or easy bruising. No dysuria gross hematuria.   Past Medical History  Diagnosis Date  . Cardiomyopathy, nonischemic ([place]) 04/2008    Likely Tako-Tsubo. Resolved. --dx'd on cath 2005. EF 29% with minimal distal CAD (small OM1 70-80).  Echo 10/09 EF 55%;  echo 01/26/12: EF 55%, mild LAE, PASP 34.  Marland Kitchen Hypertension     SEVERE  . Idiopathic thrombocytopenic purpura (ITP)   . Granuloma annulare   . History of phlebitis   . Lower extremity edema   . Osteoporosis   . Colitis, ischemic ([place])   . Fall     with non-healing rib fractures  . Lumbar stenosis 2004    DR. MARK ROY  . AV block, 1st degree   . Complication of anesthesia     post anesthesia excessive somnulence  . CAD (coronary artery disease)     Non ST elevation 2005 ; LHC in 6/05 in setting of NSTEMI: ant apical AK and inf-apical AK, EF 29%, dOM2 70-80% (small); findings c/w Tako-Tsubo CM  . Compression fracture of C-spine ([place]) 10/10/2011    Fell at home, tx at [facility]    . Diarrhea associated with pseudomembranous colitis 6/11-17/2013    [facility]  . Bradycardia   . Dyslipidemia   . Multiple pelvic fractures ([place]) 05/2012    Dr Adaline Sill , [facility]  . Glaucoma     Past Surgical History  Procedure Laterality Date  . Cataract extraction, bilateral    . Colonoscopy w/ polypectomy    . Abdominal hysterectomy    . Rotator cuff repair    . Corneal transplant  11-27-02    left eye  . Cardiac catheterization      12/2003  . Back surgery  10/2004    LS Disc 4-5 no sx.  . Colonoscopy  09/2005    adenoma  . Rectal bleed  05-01-06    colonoscopy-ischemic colitis  . Appendectomy    . Vertebroplasty      Dr Gladstone Lighter    Social History   Social History  . Marital Status: Widowed    Spouse Name: N/A  . Number of Children: 1  . Years of Education: N/A   Occupational History  . retired    Social History Main Topics  . Smoking status: Never Smoker   . Smokeless tobacco: Never Used  . Alcohol Use: No  . Drug Use: No  . Sexual Activity: Not on file   Other Topics Concern  . Not on file   Social History Narrative   Widowed  Lives by herself in a town house, drives    Son Chancie [place]            Medication List       This list is accurate as of: 10/07/15 11:59 PM.  Always use your most recent med list.               benazepril 20 MG tablet  Commonly known as:  LOTENSIN  Take 1 tablet (20 mg total) by mouth daily.     beta carotene w/minerals tablet  Take 1 tablet by mouth daily.     brimonidine 0.2 % ophthalmic solution  Commonly known as:  ALPHAGAN  Place 1 drop into the right eye 2 (two) times daily.     CALCIUM 600 + D PO  Take 1 tablet by mouth daily.     carvedilol 25 MG tablet  Commonly known as:  COREG  Take 1 tablet (25 mg total) by mouth 2 (two) times daily with a meal.     denosumab 60 MG/ML Soln injection  Commonly known as:  PROLIA  Inject 60 mg into the skin every 6 (six) months. Administer in upper arm,  thigh, or abdomen     diphenhydrAMINE 25 MG tablet  Commonly known as:  BENADRYL  Take 25 mg by mouth at bedtime.     docusate sodium 100 MG capsule  Commonly known as:  COLACE  Take 100 mg by mouth daily as needed for mild constipation. Stool softener     furosemide 40 MG tablet  Commonly known as:  LASIX  Take 1 tablet (40 mg total) by mouth daily as needed for fluid or edema.     neomycin-polymyxin b-dexamethasone 3.5-10000-0.1 Oint  Commonly known as:  MAXITROL  Place 1 application into both eyes daily as needed (dry eyes).     omeprazole 20 MG capsule  Commonly known as:  PRILOSEC  Take 20 mg by mouth daily.     OSTEO BI-FLEX ADV JOINT SHIELD Tabs  Take 1 tablet by mouth daily.     REFRESH TEARS 0.5 % Soln  Generic drug:  carboxymethylcellulose  Place 1 drop into both eyes 2 (two) times daily.     timolol 0.5 % ophthalmic solution  Commonly known as:  BETIMOL  Place 1 drop into the right eye 2 (two) times daily.     vitamin B-12 1000 MCG tablet  Commonly known as:  CYANOCOBALAMIN  Take 1,000 mcg by mouth daily.     Vitamin D3 3000 units Tabs  Take 1 tablet by mouth daily.           Objective:   Physical Exam BP 108/72 mmHg  Pulse 102  Temp(Src) 98 F (36.7 C) (Oral)  Ht 5\' 4"  (1.626 m)  Wt 170 lb 4 oz (77.225 kg)  BMI 29.21 kg/m2  SpO2 96% General:   Well developed, well nourished . NAD.  HEENT:  Normocephalic . Face symmetric, atraumatic Lungs:  CTA B Normal respiratory effort, no intercostal retractions, no accessory muscle use. Heart: RRR,  no murmur.  no pretibial edema bilaterally  Abdomen:  Not distended, soft, non-tender. No rebound or rigidity. Marland Kitchen No CVA tenderness  Skin: Not pale. Not jaundice Neurologic:  alert & oriented X3.  Speech normal, gait appropriate for age and unassisted Psych--  Cognition and judgment appear intact.  Cooperative with normal attention span and concentration.  Behavior appropriate. No anxious or depressed  appearing.    Assessment & Plan:   Assessment >  HTN severe CKD: creat ~1.4, 1.5 Dyslipidemia Hematology: --ITP-- hematology visit for 11-05-14, released to the care of PCP , check CBCs every 4 months, consult hematology if platelets <50 K --Pernicious anemia --Anemia of chronic disease CV: --CAD --Tako Tsubo cardiomyopathy --AV block first-degree DJD: --Compression fractures --Lumbar stenosis Dr. Carloyn Manner --Multiple pelvic fractures , cervical spine compression  fracture 2013 [facility] --H/o nonhealing rib  Osteoporosis: Dr. Agapito Games; s/p forteo, on Prolia H/o pseudomembranous colitis 2013 Glaucoma  S/p cornea transplant h/o granuloma annulare    PLAN: Hospital follow-up, admitted with pyelonephritis, status post antibiotics, seems to be doing well.   HTN: Check a BMP ITP: Platelets were lower than baseline at the hospital, recheck a CBC. RTC 3-4 months

## 2015-10-08 NOTE — Assessment & Plan Note (Signed)
Hospital follow-up, admitted with pyelonephritis, status post antibiotics, seems to be doing well.   HTN: Check a BMP ITP: Platelets were lower than baseline at the hospital, recheck a CBC. RTC 3-4 months

## 2015-10-14 ENCOUNTER — Other Ambulatory Visit: Payer: Self-pay | Admitting: *Deleted

## 2015-10-14 NOTE — Patient Outreach (Signed)
[facility]) Care Management  10/14/2015  CHASTELYN RENNARD May 10, 1923 [id number]  Transition of care (week 1)  RN spoke with pt today and introduced the [facility] program and purpose for contacting her today. Pt reports she is doing well with no problems and has followed up with her provide on 3/20. RN completed the transition of care template and inquired further on pt's current issues related to her HF/HTN. Pt states she is managing both well however indicates she does not weight daily in monitoring her HF. RN stress the importance of daily monitoring related to HF and briefly educated pt on the importance of daily weights related to the HF zones which were also covered on this call. Pt indicates she would start weighing daily. RN extended the offer case management services for community home visits (pt declined). RN also offered pt health coaching related to her HF however pt also declined. Therefore RN offered ongoing transition of care calls on further education and inquires on the monitoring of her HF as pt receptive. RN will scheduled the next telephonic follow up for ongoing transition of care call for next Monday. Plan of care discussed with goals for pt to follow over the next several weeks.   Raina Mina, RN Care Management Coordinator [place] Office [phone]

## 2015-10-21 ENCOUNTER — Other Ambulatory Visit: Payer: Self-pay | Admitting: *Deleted

## 2015-10-21 NOTE — Patient Outreach (Signed)
[place] [facility]) Care Management  10/21/2015  [name] Mar 09, 1923 [id number]   RN spoke with pt today who indicated she was taking her weights daily and BP and documenting all readings. Reports today weight 168.4 lbs, yesterday 167.8 lbs and last week 167.8 lbs. Pt also reports her BP at 156/86 prior to taking her BP medications. Pt confirms she is in the GREEN zone after review of the HF zones and feels comfortable with what to do if acute symptoms should occur. RN offered ongoing community case management in the home for a face-to-face home visit but pt feels she is doing well and opt not to have home visits but receptive to ongoing transition of care call. RN will alert pt's primary doctor of the ongoing interaction telephonically concerning a plan of care and goals that are in progress. Pt verbalized an understanding and without questions today and very appreciative for the week follow up calls concerning her medical issues. Pt confirms adherence who all medications and medical appointments with her last primary visit to Dr. Larose Kells on 3/27 with no reported issues. Will scheduled next telephone outreach for ongoing transition of care on Monday 10/28/2015.   Raina Mina, RN Care Management Coordinator [place] Office [phone]  [facility] CM Care Plan Problem One        Most Recent Value   Care Plan Problem One  lack of knowledge related to HF   Role Documenting the Problem One  Care Management Coordinator   Care Plan for Problem One  Active   [facility] Long Term Goal (31-90 days)  Pt will be more aware of the HF zones and related action plan within the next 31 days.    [facility] Long Term Goal Start Date  10/14/15   Interventions for Problem One Long Term Goal  Discussed the HF zones and the importance of daily monitoring   [facility] CM Short Term Goal #1 (0-30 days)  Pt will start daily weights related to monitoring fluid retention within the next 30 days.   [facility] CM Short  Term Goal #1 Start Date  10/14/15   Interventions for Short Term Goal #1  Discussed theimportance of daily wieghts and how fluid retention results in symptoms of HF.    [facility] CM Care Plan Problem Two        Most Recent Value   Care Plan Problem Two  Medical appointments adherence   Role Documenting the Problem Two  Care Management Coordinator   Care Plan for Problem Two  Active   [facility] CM Short Term Goal #1 (0-30 days)  Pt will follow up on all medical appointments following her recent hospital discharge   [facility] CM Short Term Goal #1 Start Date  10/14/15   Interventions for Short Term Goal #2   Discussed the importance of post follow up visits withher providers. Encouraged adherence

## 2015-10-28 ENCOUNTER — Other Ambulatory Visit: Payer: Self-pay | Admitting: *Deleted

## 2015-10-28 NOTE — Patient Outreach (Signed)
[place] [facility]) Care Management  10/28/2015  [name] 1922-09-15 [id number]   Transition of care   RN spoke with pt today who indicates she is doing well with no problems. Pt reports her ongoing weights with today's weight at 168.5 lbs, yesterday 169 lbs and last Monday at 168.4 lbs with no swelling or problems breathing. RN extended once again the offer for community home visit or telephone disease management however pt feels she is managing her care well and does not need an extension of [facility] services. Pt remains receptive to [facility] transition of care calls and receptive to another call next Monday as RN will schedule accordingly.  Raina Mina, RN Care Management Coordinator [place] Office [phone]

## 2015-11-04 ENCOUNTER — Other Ambulatory Visit: Payer: Self-pay | Admitting: *Deleted

## 2015-11-04 NOTE — Patient Outreach (Addendum)
[place] [facility]) Care Management  11/04/2015  [name] 09/30/1922 [id number]   Transition of care (week 4)  RN spoke with pt today inquired in her ongoing management of her HF. Pt states her weight remains the same with no weight change and verifies she remains in the GREEN zone with no precipitating symptoms. RN reviewed the HF sones related to the GREEN zone and what to do if acute symptoms should occur.  Pt continues to feels comfort as she manages her care well with no need for case management for a home visit. RN will requested ongoing telephonic follow up for ongoing transition of care. Pt receptive to ongoing telephonic for next week as RN will scheduled accordingly as pt agreed.  Raina Mina, RN Care Management Coordinator [place] Office [phone]

## 2015-11-11 ENCOUNTER — Other Ambulatory Visit: Payer: Self-pay | Admitting: *Deleted

## 2015-11-11 ENCOUNTER — Encounter: Payer: Self-pay | Admitting: *Deleted

## 2015-11-11 NOTE — Patient Outreach (Addendum)
[place] [facility]) Care Management  11/11/2015  [name] Nov 29, 1922 [id number]   Transition of care  RN spoke with pt today concerning her ongoing monitoring and management of care related to her HF. Pt states she is doing well with her daily weights and reports today at 168.4 lbs, yesterday 169 lbs and last week  168.8 with no encountered symptoms of distress. Pt states she is documenting all her weights and denies any symptoms since the last conversation with this RN. Pt states she is taking all her medications as prescribed with no problems and attending all her scheduled appointments with no delays. Pt feels she is able to manage her HF with further assistance at this time and has met all goals discussed via her plan of care. Pt very grateful for the contacts over the last month and has the contact number if she is in need of [facility] services once again. RN will notify pt's primary provider of today's discharge and pt's success in meeting all her goals. Case will be closed with goals met.   Raina Mina, RN Care Management Coordinator [place] Office [phone]

## 2015-11-12 ENCOUNTER — Ambulatory Visit: Payer: No Typology Code available for payment source | Admitting: Internal Medicine

## 2016-01-06 ENCOUNTER — Ambulatory Visit (INDEPENDENT_AMBULATORY_CARE_PROVIDER_SITE_OTHER): Payer: Medicare Other | Admitting: Internal Medicine

## 2016-01-06 ENCOUNTER — Ambulatory Visit (HOSPITAL_BASED_OUTPATIENT_CLINIC_OR_DEPARTMENT_OTHER)
Admission: RE | Admit: 2016-01-06 | Discharge: 2016-01-06 | Disposition: A | Discharge status: Home / Self Care | Payer: Medicare Other | Source: Ambulatory Visit | Attending: Internal Medicine | Admitting: Internal Medicine

## 2016-01-06 ENCOUNTER — Encounter: Payer: Self-pay | Admitting: Internal Medicine

## 2016-01-06 VITALS — BP 130/76 | HR 72 | Temp 98.1°F | Ht 64.0 in | Wt 172.0 lb

## 2016-01-06 DIAGNOSIS — N183 Chronic kidney disease, stage 3 unspecified: Secondary | ICD-10-CM

## 2016-01-06 DIAGNOSIS — R0989 Other specified symptoms and signs involving the circulatory and respiratory systems: Secondary | ICD-10-CM

## 2016-01-06 DIAGNOSIS — I10 Essential (primary) hypertension: Secondary | ICD-10-CM

## 2016-01-06 DIAGNOSIS — J449 Chronic obstructive pulmonary disease, unspecified: Secondary | ICD-10-CM | POA: Insufficient documentation

## 2016-01-06 DIAGNOSIS — I700 Atherosclerosis of aorta: Secondary | ICD-10-CM | POA: Diagnosis not present

## 2016-01-06 DIAGNOSIS — M810 Age-related osteoporosis without current pathological fracture: Secondary | ICD-10-CM

## 2016-01-06 DIAGNOSIS — I509 Heart failure, unspecified: Secondary | ICD-10-CM | POA: Insufficient documentation

## 2016-01-06 DIAGNOSIS — K449 Diaphragmatic hernia without obstruction or gangrene: Secondary | ICD-10-CM | POA: Diagnosis not present

## 2016-01-06 DIAGNOSIS — D693 Immune thrombocytopenic purpura: Secondary | ICD-10-CM

## 2016-01-06 LAB — CBC WITH DIFFERENTIAL/PLATELET
BASOS PCT: 0.5 % (ref 0.0–3.0)
Basophils Absolute: 0 10*3/uL (ref 0.0–0.1)
EOS ABS: 0.4 10*3/uL (ref 0.0–0.7)
EOS PCT: 5.9 % — AB (ref 0.0–5.0)
HEMATOCRIT: 35.8 % — AB (ref 36.0–46.0)
Hemoglobin: 11.9 g/dL — ABNORMAL LOW (ref 12.0–15.0)
LYMPHS ABS: 1.3 10*3/uL (ref 0.7–4.0)
Lymphocytes Relative: 20.3 % (ref 12.0–46.0)
MCHC: 33.3 g/dL (ref 30.0–36.0)
MCV: 93.1 fl (ref 78.0–100.0)
MONO ABS: 0.4 10*3/uL (ref 0.1–1.0)
Monocytes Relative: 5.9 % (ref 3.0–12.0)
NEUTROS PCT: 67.4 % (ref 43.0–77.0)
Neutro Abs: 4.2 10*3/uL (ref 1.4–7.7)
Platelets: 70 10*3/uL — ABNORMAL LOW (ref 150.0–400.0)
RBC: 3.84 Mil/uL — ABNORMAL LOW (ref 3.87–5.11)
RDW: 14.1 % (ref 11.5–15.5)
WBC: 6.2 10*3/uL (ref 4.0–10.5)

## 2016-01-06 LAB — BASIC METABOLIC PANEL
BUN: 24 mg/dL — AB (ref 6–23)
CHLORIDE: 108 meq/L (ref 96–112)
CO2: 27 mEq/L (ref 19–32)
CREATININE: 1.14 mg/dL (ref 0.40–1.20)
Calcium: 8.4 mg/dL (ref 8.4–10.5)
GFR: 47.46 mL/min — AB (ref >60.00)
GLUCOSE: 86 mg/dL (ref 70–99)
POTASSIUM: 4.5 meq/L (ref 3.5–5.1)
Sodium: 139 mEq/L (ref 135–145)

## 2016-01-06 NOTE — Patient Instructions (Signed)
GO TO THE LAB : Get the blood work     GO TO THE FRONT DESK Schedule your next appointment for a  Routine visit in 4 months, no fasting   STOP BY THE FIRST FLOOR:  get the XR

## 2016-01-06 NOTE — Progress Notes (Signed)
Subjective:    Patient ID: [name], female    DOB: August 29, 1922, 82 y.o.   MRN: [id number]  DOS:  01/06/2016 Type of visit - description : routine checkup Interval history: HTN: Good compliance of medication. No apparent s/e . ITP: Due for a CBC Reports her nails become dystrophic few weeks ago, the cuticula/ fingers were tender but  that has resolved. Osteoporosis, got a prolia 09-2015  Review of Systems  No  chest pain, difficulty breathing with normal activities, no lower extremity edema No nausea, vomiting, diarrhea Occasional cough, no wheezing No recent falls. In general feeling well. Occasionally has an ache at the arms bilaterally, not in the joints but between the shoulder and elbow. Denies fever, chills, headache, weight loss, visual disturbances. Symptoms were more noticeable right after she got a prolia injection   Past Medical History  Diagnosis Date  . Cardiomyopathy, nonischemic ([place]) 04/2008    Likely Tako-Tsubo. Resolved. --dx'd on cath 2005. EF 29% with minimal distal CAD (small OM1 70-80).  Echo 10/09 EF 55%;  echo 01/26/12: EF 55%, mild LAE, PASP 34.  Marland Kitchen Hypertension     SEVERE  . Idiopathic thrombocytopenic purpura (ITP)   . Granuloma annulare   . History of phlebitis   . Lower extremity edema   . Osteoporosis   . Colitis, ischemic ([place])   . Fall     with non-healing rib fractures  . Lumbar stenosis 2004    DR. MARK ROY  . AV block, 1st degree   . Complication of anesthesia     post anesthesia excessive somnulence  . CAD (coronary artery disease)     Non ST elevation 2005 ; LHC in 6/05 in setting of NSTEMI: ant apical AK and inf-apical AK, EF 29%, dOM2 70-80% (small); findings c/w Tako-Tsubo CM  . Compression fracture of C-spine ([place]) 10/10/2011    Fell at home, tx at [facility]  . Diarrhea associated with pseudomembranous colitis 6/11-17/2013    [facility]  . Bradycardia   . Dyslipidemia   . Multiple pelvic fractures ([place]) 05/2012    Dr  Adaline Sill , [facility]  . Glaucoma     Past Surgical History  Procedure Laterality Date  . Cataract extraction, bilateral    . Colonoscopy w/ polypectomy    . Abdominal hysterectomy    . Rotator cuff repair    . Corneal transplant  11-27-02    left eye  . Cardiac catheterization      12/2003  . Back surgery  10/2004    LS Disc 4-5 no sx.  . Colonoscopy  09/2005    adenoma  . Rectal bleed  05-01-06    colonoscopy-ischemic colitis  . Appendectomy    . Vertebroplasty      Dr Gladstone Lighter    Social History   Social History  . Marital Status: Widowed    Spouse Name: N/A  . Number of Children: 1  . Years of Education: N/A   Occupational History  . retired    Social History Main Topics  . Smoking status: Never Smoker   . Smokeless tobacco: Never Used  . Alcohol Use: No  . Drug Use: No  . Sexual Activity: Not on file   Other Topics Concern  . Not on file   Social History Narrative   Widowed   Lives by herself in a town house, drives    Son Jiyana Rocchi            Medication List  This list is accurate as of: 01/06/16 10:51 AM.  Always use your most recent med list.               benazepril 20 MG tablet  Commonly known as:  LOTENSIN  Take 1 tablet (20 mg total) by mouth daily.     beta carotene w/minerals tablet  Take 1 tablet by mouth daily.     brimonidine 0.2 % ophthalmic solution  Commonly known as:  ALPHAGAN  Place 1 drop into the right eye 2 (two) times daily.     CALCIUM 600 + D PO  Take 1 tablet by mouth daily.     carvedilol 25 MG tablet  Commonly known as:  COREG  Take 1 tablet (25 mg total) by mouth 2 (two) times daily with a meal.     denosumab 60 MG/ML Soln injection  Commonly known as:  PROLIA  Inject 60 mg into the skin every 6 (six) months. Administer in upper arm, thigh, or abdomen     diphenhydrAMINE 25 MG tablet  Commonly known as:  BENADRYL  Take 25 mg by mouth at bedtime.     docusate sodium 100 MG capsule  Commonly known as:   COLACE  Take 100 mg by mouth daily as needed for mild constipation. Stool softener     furosemide 40 MG tablet  Commonly known as:  LASIX  Take 1 tablet (40 mg total) by mouth daily as needed for fluid or edema.     neomycin-polymyxin b-dexamethasone 3.5-10000-0.1 Oint  Commonly known as:  MAXITROL  Place 1 application into both eyes daily as needed (dry eyes).     omeprazole 20 MG capsule  Commonly known as:  PRILOSEC  Take 20 mg by mouth daily.     OSTEO BI-FLEX ADV JOINT SHIELD Tabs  Take 1 tablet by mouth daily.     REFRESH TEARS 0.5 % Soln  Generic drug:  carboxymethylcellulose  Place 1 drop into both eyes 2 (two) times daily.     timolol 0.5 % ophthalmic solution  Commonly known as:  BETIMOL  Place 1 drop into the right eye 2 (two) times daily.     vitamin B-12 1000 MCG tablet  Commonly known as:  CYANOCOBALAMIN  Take 1,000 mcg by mouth daily.     Vitamin D3 3000 units Tabs  Take 1 tablet by mouth daily.           Objective:   Physical Exam BP 130/76 mmHg  Pulse 72  Temp(Src) 98.1 F (36.7 C) (Oral)  Ht 5\' 4"  (1.626 m)  Wt 172 lb (78.019 kg)  BMI 29.51 kg/m2  SpO2 98% General:   Well developed, well nourished . NAD.  HEENT:  Normocephalic . Face symmetric, atraumatic. T.A. Palpable and no TTP Lungs:  Crackles at bases Normal respiratory effort, no intercostal retractions, no accessory muscle use. Heart: RRR,  no murmur.  No pretibial edema bilaterally  Skin: Not pale. Not jaundice. The base of the nail are dystrophic, no redness, swelling or discharge. Most R fingernails are affected, only the L  midle fingernail on the left hand. Neurologic:  alert & oriented X3.  Speech normal, gait appropriate for age and unassisted Psych--  Cognition and judgment appear intact.  Cooperative with normal attention span and concentration.  Behavior appropriate. No anxious or depressed appearing.      Assessment & Plan:   Assessment > HTN severe CKD:  creat ~1.4, 1.5 Dyslipidemia Hematology: --ITP-- hematology visit for 11-05-14, released  to the care of PCP , check CBCs every 4 months, consult hematology if platelets <50 K --Pernicious anemia --Anemia of chronic disease CV: --CAD --Tako Tsubo cardiomyopathy --Diastolic  CHF, resolved - used to see Dr Missy Sabins --AV block first-degree DJD: --Compression fractures --Lumbar stenosis Dr. Carloyn Manner --Multiple pelvic fractures , cervical spine compression  fracture 2013 [facility] --H/o nonhealing rib  Osteoporosis: Dr. Agapito Games; s/p forteo, s/p  Prolia 09-2015 OPHT: --Glaucoma  --S/p cornea transplant -- mac degeneration h/o granuloma annulare   H/o pseudomembranous colitis 2013  PLAN: HTN: Currently controlled. Continue Lotensin, Lasix, carvedilol. Check a BMP CKD: Checking a BMP Dyslipidemia: Cholesterol has not been check in years, she does not recall taking any meds.   ITP: check a  CBC Bibasilar crackles: dry  crackers  on exam, she does not seem to be vol overloaded, she never smoker. She may have some degree of pulmonary fibrosis, I think is reasonable to a chest x-ray but otherwise we agreed not to pursue a extensive workup. She is managing very well. Osteoporosis: Manage elsewhere, does not know if likes to proceed with the next Prpolia, benefit of osteoporosis treatment discussed Dystrophic nail: Observation RTC 4 months

## 2016-01-06 NOTE — Progress Notes (Signed)
Pre visit review using our clinic review tool, if applicable. No additional management support is needed unless otherwise documented below in the visit note. 

## 2016-01-06 NOTE — Assessment & Plan Note (Addendum)
HTN: Currently controlled. Continue Lotensin, Lasix, carvedilol. Check a BMP CKD: Checking a BMP Dyslipidemia: Cholesterol has not been check in years, she does not recall taking any meds.   ITP: check a  CBC Bibasilar crackles: dry  crackers  on exam, she does not seem to be vol overloaded, she never smoker. She may have some degree of pulmonary fibrosis, I think is reasonable to a chest x-ray but otherwise we agreed not to pursue a extensive workup. She is managing very well. Osteoporosis: Manage elsewhere, does not know if likes to proceed with the next Prpolia, benefit of osteoporosis treatment discussed Dystrophic nail: Observation RTC 4 months

## 2016-01-10 DIAGNOSIS — Z947 Corneal transplant status: Secondary | ICD-10-CM | POA: Diagnosis not present

## 2016-01-10 DIAGNOSIS — H1851 Endothelial corneal dystrophy: Secondary | ICD-10-CM | POA: Diagnosis not present

## 2016-01-10 DIAGNOSIS — H43813 Vitreous degeneration, bilateral: Secondary | ICD-10-CM | POA: Diagnosis not present

## 2016-01-10 DIAGNOSIS — Z961 Presence of intraocular lens: Secondary | ICD-10-CM | POA: Diagnosis not present

## 2016-01-10 DIAGNOSIS — H353132 Nonexudative age-related macular degeneration, bilateral, intermediate dry stage: Secondary | ICD-10-CM | POA: Diagnosis not present

## 2016-01-15 ENCOUNTER — Other Ambulatory Visit: Payer: Self-pay | Admitting: Internal Medicine

## 2016-03-31 DIAGNOSIS — K219 Gastro-esophageal reflux disease without esophagitis: Secondary | ICD-10-CM | POA: Diagnosis not present

## 2016-03-31 DIAGNOSIS — M810 Age-related osteoporosis without current pathological fracture: Secondary | ICD-10-CM | POA: Diagnosis not present

## 2016-03-31 DIAGNOSIS — Z9181 History of falling: Secondary | ICD-10-CM | POA: Diagnosis not present

## 2016-04-07 ENCOUNTER — Telehealth (HOSPITAL_COMMUNITY): Payer: Self-pay | Admitting: Vascular Surgery

## 2016-04-07 NOTE — Telephone Encounter (Signed)
Returned pt call to make f/u appt, has not been seen since 2015

## 2016-04-24 ENCOUNTER — Encounter: Payer: Self-pay | Admitting: Internal Medicine

## 2016-04-29 ENCOUNTER — Ambulatory Visit (HOSPITAL_COMMUNITY)
Admission: RE | Admit: 2016-04-29 | Discharge: 2016-04-29 | Disposition: A | Discharge status: Home / Self Care | Payer: Medicare Other | Source: Ambulatory Visit | Attending: Internal Medicine | Admitting: Internal Medicine

## 2016-04-29 VITALS — BP 132/88 | HR 74 | Wt 175.4 lb

## 2016-04-29 DIAGNOSIS — I509 Heart failure, unspecified: Secondary | ICD-10-CM | POA: Diagnosis present

## 2016-04-29 DIAGNOSIS — I130 Hypertensive heart and chronic kidney disease with heart failure and stage 1 through stage 4 chronic kidney disease, or unspecified chronic kidney disease: Secondary | ICD-10-CM | POA: Diagnosis not present

## 2016-04-29 DIAGNOSIS — I5032 Chronic diastolic (congestive) heart failure: Secondary | ICD-10-CM | POA: Diagnosis not present

## 2016-04-29 DIAGNOSIS — I10 Essential (primary) hypertension: Secondary | ICD-10-CM

## 2016-04-29 DIAGNOSIS — Z79899 Other long term (current) drug therapy: Secondary | ICD-10-CM | POA: Insufficient documentation

## 2016-04-29 DIAGNOSIS — I2510 Atherosclerotic heart disease of native coronary artery without angina pectoris: Secondary | ICD-10-CM | POA: Diagnosis not present

## 2016-04-29 DIAGNOSIS — N183 Chronic kidney disease, stage 3 unspecified: Secondary | ICD-10-CM

## 2016-04-29 DIAGNOSIS — I491 Atrial premature depolarization: Secondary | ICD-10-CM

## 2016-04-29 DIAGNOSIS — I5181 Takotsubo syndrome: Secondary | ICD-10-CM | POA: Diagnosis not present

## 2016-04-29 NOTE — Progress Notes (Signed)
Patient ID: [name], female   DOB: 16-Feb-1923, 82 y.o.   MRN: [id number] PCP: Dr Linna Darner  HPI: [name] is a 82 y.o. 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 LHC in 6/05 in setting of NSTEMI: ant apical AK and inf-apical AK, EF 29%, dOM2 70-80% (small). EF has recovered. Also has a h/o HTN, SVT and fractures pelvis 05/2012. 02/2012  Discharged form [employer] after a fall which resulted in a cervical fracture.   01/2012 ECHO EF 55% 08/14/13 ECHO EF 55%, normal RV size and systolic function.   She returns today for regular follow up. Overall feeling well. States she still has occasional chest pain that only lasts for a few seconds. She cant really describe the pain, just an "ache". It is not related to exertion. Happens just as often at rest. She denies SOB with showering or changing clothes. Gets DOE walking from car to grocery store, but then is fine pushing a shopping cart around.  She does get occasionally lightheaded (daily) when sitting up or standing.  No near syncope.  Last time she fell was in 2014, but she broke spine and pelvis that year, so is very cautious concerning falls. Taking all medications as directed.   ECG: NSR 62. LVH No ST-T wave abnormalities.    Labs (1/15): K 4.3, creatinine 1.8, BNP 49 Labs (6/17): K 4.5, creatinine 1.14 Labs (9/17): K 4.5, Creatinine 1.38  ROS: All systems negative except as listed in HPI, PMH and Problem List.  Past Medical History:  Diagnosis Date  . AV block, 1st degree   . Bradycardia   . CAD (coronary artery disease)    Non ST elevation 2005 ; LHC in 6/05 in setting of NSTEMI: ant apical AK and inf-apical AK, EF 29%, dOM2 70-80% (small); findings c/w Tako-Tsubo CM  . Cardiomyopathy, nonischemic ([place]) 04/2008   Likely Tako-Tsubo. Resolved. --dx'd on cath 2005. EF 29% with minimal distal CAD (small OM1 70-80).  Echo 10/09 EF 55%;  echo 01/26/12: EF 55%, mild LAE, PASP 34.  . Colitis, ischemic ([place])   .  Complication of anesthesia    post anesthesia excessive somnulence  . Compression fracture of C-spine ([place]) 10/10/2011   Fell at home, tx at [facility]  . Diarrhea associated with pseudomembranous colitis 6/11-17/2013   [facility]  . Dyslipidemia   . Fall    with non-healing rib fractures  . Glaucoma   . Granuloma annulare   . History of phlebitis   . Hypertension    SEVERE  . Idiopathic thrombocytopenic purpura (ITP)   . Lower extremity edema   . Lumbar stenosis 2004   DR. MARK ROY  . Multiple pelvic fractures ([place]) 05/2012   Dr Adaline Sill , [facility]  . Osteoporosis     Current Outpatient Prescriptions  Medication Sig Dispense Refill  . benazepril (LOTENSIN) 20 MG tablet Take 1 tablet (20 mg total) by mouth daily. 90 tablet 1  . beta carotene w/minerals (OCUVITE) tablet Take 1 tablet by mouth daily.     . brimonidine (ALPHAGAN) 0.2 % ophthalmic solution Place 1 drop into the right eye 2 (two) times daily.      . Calcium Carb-Cholecalciferol (CALCIUM 600 + D PO) Take 1 tablet by mouth daily.    . carboxymethylcellulose (REFRESH TEARS) 0.5 % SOLN Place 1 drop into both eyes 2 (two) times daily.     . carvedilol (COREG) 25 MG tablet Take 1 tablet (25 mg total) by mouth  2 (two) times daily with a meal. 180 tablet 1  . Cholecalciferol (VITAMIN D3) 3000 UNITS TABS Take 1 tablet by mouth daily.    Marland Kitchen denosumab (PROLIA) 60 MG/ML SOLN injection Inject 60 mg into the skin every 6 (six) months. Administer in upper arm, thigh, or abdomen    . diphenhydrAMINE (BENADRYL) 25 MG tablet Take 25 mg by mouth at bedtime.    . docusate sodium (COLACE) 100 MG capsule Take 100 mg by mouth daily as needed for mild constipation. Stool softener    . furosemide (LASIX) 40 MG tablet Take 1 tablet (40 mg total) by mouth daily. 90 tablet 1  . Misc Natural Products (OSTEO BI-FLEX ADV JOINT SHIELD) TABS Take 1 tablet by mouth daily.     Marland Kitchen neomycin-polymyxin b-dexamethasone (MAXITROL) 3.5-10000-0.1 OINT Place 1  application into both eyes daily as needed (dry eyes).   3  . omeprazole (PRILOSEC) 20 MG capsule Take 20 mg by mouth daily.      . timolol (BETIMOL) 0.5 % ophthalmic solution Place 1 drop into the right eye 2 (two) times daily.    . vitamin B-12 (CYANOCOBALAMIN) 1000 MCG tablet Take 1,000 mcg by mouth daily.     No current facility-administered medications for this encounter.      PHYSICAL EXAM: Vitals:   04/29/16 1356  BP: 132/88  BP Location: Left Arm  Patient Position: Sitting  Cuff Size: Normal  Pulse: 74  SpO2: 94%  Weight: 175 lb 6.4 oz (79.6 kg)   Wt Readings from Last 3 Encounters:  04/29/16 175 lb 6.4 oz (79.6 kg)  01/06/16 172 lb (78 kg)  10/07/15 170 lb 4 oz (77.2 kg)     General:  Elderly appearing. Well appearing. NAD.  HEENT: normal Neck: supple. JVP 6-7 cm. Carotids 2+ bilaterally; no bruits. No thyromegaly or nodule noted.  Cor: PMI normal. RRR. No M/G/R Lungs: Mildly diminished in bases, otherwise clear.  Abdomen: soft, NT, ND, no HSM. No bruits or masses. +BS  Extremities: no cyanosis, clubbing, rash, No peripheral edema.  Neuro: alert & orientedx3, cranial nerves grossly intact. Moves all 4 extremities w/o difficulty. Affect pleasant.    ASSESSMENT & PLAN: 1. Chronic diastolic CHF: History of Takotsubo cardiomyopathy, EF has recovered. - Echo 08/14/13 EF 55-60% with normal RV.  - Continue Lasix 40 mg daily - Continue coreg 25 mg BID - Continue benazepril 20 mg daily.  2. CKD:  - Stable via labs at [place] last month. 3. CAD: No ischemic symptoms.  Nonobstructive CAD on prior cath.   - She has occasional atypical chest pain.  Not no a statin.  Low yield to start one at this point.  4. Ectopic atrial rhythm:  - Noted on ECG at last visit 08/2013.  EKG today shows NSR - Continue coreg as above  She is stable from a HF perspective. Would not increase any of her medications with tendency to become lightheaded and history of falls.   Suspect that she can  follow up as needed at this point.   Shirley Friar, PA-C 04/29/2016  Patient seen and examined with Oda Kilts, PA-C. We discussed all aspects of the encounter. I agree with the assessment and plan as stated above.   She is doing great. Volume status well managed. BP is good. NSR on ECG. Will see back on yearly basis.   Kohan Azizi,MD 12:11 AM

## 2016-05-11 ENCOUNTER — Encounter: Payer: Self-pay | Admitting: Internal Medicine

## 2016-05-11 ENCOUNTER — Ambulatory Visit (INDEPENDENT_AMBULATORY_CARE_PROVIDER_SITE_OTHER): Payer: Medicare Other | Admitting: Internal Medicine

## 2016-05-11 VITALS — BP 126/74 | HR 100 | Temp 97.4°F | Resp 14 | Ht 64.0 in | Wt 177.0 lb

## 2016-05-11 DIAGNOSIS — Z23 Encounter for immunization: Secondary | ICD-10-CM

## 2016-05-11 DIAGNOSIS — D510 Vitamin B12 deficiency anemia due to intrinsic factor deficiency: Secondary | ICD-10-CM | POA: Diagnosis not present

## 2016-05-11 DIAGNOSIS — D693 Immune thrombocytopenic purpura: Secondary | ICD-10-CM

## 2016-05-11 DIAGNOSIS — I10 Essential (primary) hypertension: Secondary | ICD-10-CM | POA: Diagnosis not present

## 2016-05-11 DIAGNOSIS — I2510 Atherosclerotic heart disease of native coronary artery without angina pectoris: Secondary | ICD-10-CM | POA: Diagnosis not present

## 2016-05-11 NOTE — Progress Notes (Signed)
Subjective:    Patient ID: [name], female    DOB: 07-05-1923, 82 y.o.   MRN: [id number]  DOS:  05/11/2016 Type of visit - description : rov Interval history: In general feeling well, no major concerns, good compliance of medication including B12 supplements. Labs reviewed. She remains independent, lives by herself, drives.   Review of Systems Denies chest pain, difficulty breathing No nausea, vomiting, diarrhea No anxiety or depression  Past Medical History:  Diagnosis Date  . AV block, 1st degree   . Bradycardia   . CAD (coronary artery disease)    Non ST elevation 2005 ; LHC in 6/05 in setting of NSTEMI: ant apical AK and inf-apical AK, EF 29%, dOM2 70-80% (small); findings c/w Tako-Tsubo CM  . Cardiomyopathy, nonischemic ([place]) 04/2008   Likely Tako-Tsubo. Resolved. --dx'd on cath 2005. EF 29% with minimal distal CAD (small OM1 70-80).  Echo 10/09 EF 55%;  echo 01/26/12: EF 55%, mild LAE, PASP 34.  . Colitis, ischemic ([place])   . Complication of anesthesia    post anesthesia excessive somnulence  . Compression fracture of C-spine ([place]) 10/10/2011   Fell at home, tx at [facility]  . Diarrhea associated with pseudomembranous colitis 6/11-17/2013   [facility]  . Dyslipidemia   . Fall    with non-healing rib fractures  . Glaucoma   . Granuloma annulare   . History of phlebitis   . Hypertension    SEVERE  . Idiopathic thrombocytopenic purpura (ITP)   . Lower extremity edema   . Lumbar stenosis 2004   DR. MARK ROY  . Multiple pelvic fractures ([place]) 05/2012   Dr Adaline Sill , [facility]  . Osteoporosis     Past Surgical History:  Procedure Laterality Date  . ABDOMINAL HYSTERECTOMY    . APPENDECTOMY    . BACK SURGERY  10/2004   LS Disc 4-5 no sx.  Marland Kitchen CARDIAC CATHETERIZATION     12/2003  . CATARACT EXTRACTION, BILATERAL    . COLONOSCOPY  09/2005   adenoma  . COLONOSCOPY W/ POLYPECTOMY    . 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 rectal bleed  05-01-06   colonoscopy-ischemic colitis  . ROTATOR CUFF REPAIR    . VERTEBROPLASTY     Dr Gladstone Lighter    Social History   Social History  . Marital status: Widowed    Spouse name: N/A  . Number of children: 1  . Years of education: N/A   Occupational History  . retired Retired   Social History Main Topics  . Smoking status: Never Smoker  . Smokeless tobacco: Never Used  . Alcohol use No  . Drug use: No  . Sexual activity: Not on file   Other Topics Concern  . Not on file   Social History Narrative   Widowed   Lives by herself in a town house, drives    Son [name]            Medication List       Accurate as of 05/11/16 11:59 PM. Always use your most recent med list.          benazepril 20 MG tablet Commonly known as:  LOTENSIN Take 1 tablet (20 mg total) by mouth daily.   beta carotene w/minerals tablet Take 1 tablet by mouth daily.   brimonidine 0.2 % ophthalmic solution Commonly known as:  ALPHAGAN Place 1 drop into the right eye 2 (two) times daily.   CALCIUM 600 + D  PO Take 1 tablet by mouth daily.   carvedilol 25 MG tablet Commonly known as:  COREG Take 1 tablet (25 mg total) by mouth 2 (two) times daily with a meal.   denosumab 60 MG/ML Soln injection Commonly known as:  PROLIA Inject 60 mg into the skin every 6 (six) months. Administer in upper arm, thigh, or abdomen   diphenhydrAMINE 25 MG tablet Commonly known as:  BENADRYL Take 25 mg by mouth at bedtime.   docusate sodium 100 MG capsule Commonly known as:  COLACE Take 100 mg by mouth daily as needed for mild constipation. Stool softener   furosemide 40 MG tablet Commonly known as:  LASIX Take 1 tablet (40 mg total) by mouth daily.   neomycin-polymyxin b-dexamethasone 3.5-10000-0.1 Oint Commonly known as:  MAXITROL Place 1 application into both eyes daily as needed (dry eyes).   omeprazole 20 MG capsule Commonly known as:  PRILOSEC Take 20 mg by mouth daily.   OSTEO BI-FLEX ADV  JOINT SHIELD Tabs Take 1 tablet by mouth daily.   REFRESH TEARS 0.5 % Soln Generic drug:  carboxymethylcellulose Place 1 drop into both eyes 2 (two) times daily.   timolol 0.5 % ophthalmic solution Commonly known as:  BETIMOL Place 1 drop into the right eye 2 (two) times daily.   vitamin B-12 1000 MCG tablet Commonly known as:  CYANOCOBALAMIN Take 1,000 mcg by mouth daily.   Vitamin D3 3000 units Tabs Take 1 tablet by mouth daily.          Objective:   Physical Exam BP 126/74 (BP Location: Left Arm, Patient Position: Sitting, Cuff Size: Normal)   Pulse 100   Temp 97.4 F (36.3 C) (Oral)   Resp 14   Ht 5\' 4"  (1.626 m)   Wt 177 lb (80.3 kg)   SpO2 95%   BMI 30.38 kg/m  General:   Well developed, well nourished . NAD.  HEENT:  Normocephalic . Face symmetric, atraumatic Neck: No LAD is Lungs:  Few crackles at bases, more noticeable on the right Normal respiratory effort, no intercostal retractions, no accessory muscle use. Heart: RRR,  no murmur.  No pretibial edema bilaterally  Skin: Rash present at the lower back and legs, consistent with G.A. Neurologic:  alert & oriented X3.  Speech normal, gait appropriate for age , assisted by a cane. Psych--  Cognition and judgment appear intact.  Cooperative with normal attention span and concentration.  Behavior appropriate. No anxious or depressed appearing.      Assessment & Plan:    Assessment > HTN severe CKD: creat ~1.4, 1.5 Dyslipidemia Hematology: --ITP-- hematology visit for 11-05-14, released to the care of PCP , check CBCs every 4 months, consult hematology if platelets <50 K --Pernicious anemia --Anemia of chronic disease CV: --CAD --Tako Tsubo cardiomyopathy --Diastolic  CHF, resolved - used to see Dr Missy Sabins --AV block first-degree DJD: --Compression fractures --Lumbar stenosis Dr. Carloyn Manner --Multiple pelvic fractures , cervical spine compression  fracture 2013 [facility] --H/o nonhealing rib    Osteoporosis: Dr. Agapito Games; s/p forteo, s/p  Prolia 09-2015 OPHT: --Glaucoma  --S/p cornea transplant -- mac degeneration h/o granuloma annulare   H/o pseudomembranous colitis 2013  PLAN:  HTN: Controlled with Lotensin, Lasix, carvedilol. Check a BMP ITP: Check a CBC, no evidence of bleeding. Pernicious anemia: Check a E17, folic acid Crackles at the bases, stable, no sx, recommend observation Flu shot today RTC 4-5 months  b

## 2016-05-11 NOTE — Patient Instructions (Signed)
GO TO THE LAB : Get the blood work     GO TO THE FRONT DESK Schedule your next appointment for a  routine checkup in 4-5 months  

## 2016-05-11 NOTE — Progress Notes (Signed)
Pre visit review using our clinic review tool, if applicable. No additional management support is needed unless otherwise documented below in the visit note. 

## 2016-05-12 LAB — CBC WITH DIFFERENTIAL/PLATELET
BASOS ABS: 0 10*3/uL (ref 0.0–0.1)
BASOS PCT: 0.5 % (ref 0.0–3.0)
EOS ABS: 0.4 10*3/uL (ref 0.0–0.7)
Eosinophils Relative: 6.9 % — ABNORMAL HIGH (ref 0.0–5.0)
HEMATOCRIT: 34.8 % — AB (ref 36.0–46.0)
HEMOGLOBIN: 11.9 g/dL — AB (ref 12.0–15.0)
LYMPHS PCT: 27.4 % (ref 12.0–46.0)
Lymphs Abs: 1.5 10*3/uL (ref 0.7–4.0)
MCHC: 34.2 g/dL (ref 30.0–36.0)
MCV: 94.1 fl (ref 78.0–100.0)
MONOS PCT: 5.2 % (ref 3.0–12.0)
Monocytes Absolute: 0.3 10*3/uL (ref 0.1–1.0)
Neutro Abs: 3.3 10*3/uL (ref 1.4–7.7)
Neutrophils Relative %: 60 % (ref 43.0–77.0)
Platelets: 54 10*3/uL — ABNORMAL LOW (ref 150.0–400.0)
RBC: 3.7 Mil/uL — ABNORMAL LOW (ref 3.87–5.11)
RDW: 13.9 % (ref 11.5–15.5)
WBC: 5.4 10*3/uL (ref 4.0–10.5)

## 2016-05-12 LAB — BASIC METABOLIC PANEL
BUN: 23 mg/dL (ref 6–23)
CALCIUM: 9.4 mg/dL (ref 8.4–10.5)
CHLORIDE: 108 meq/L (ref 96–112)
CO2: 23 mEq/L (ref 19–32)
CREATININE: 1.24 mg/dL — AB (ref 0.40–1.20)
GFR: 43.04 mL/min — ABNORMAL LOW (ref >60.00)
Glucose, Bld: 90 mg/dL (ref 70–99)
Potassium: 4.3 mEq/L (ref 3.5–5.1)
SODIUM: 140 meq/L (ref 135–145)

## 2016-05-12 LAB — VITAMIN B12: Vitamin B-12: 221 pg/mL (ref 211–911)

## 2016-05-12 LAB — FOLATE

## 2016-05-12 NOTE — Assessment & Plan Note (Signed)
HTN: Controlled with Lotensin, Lasix, carvedilol. Check a BMP ITP: Check a CBC, no evidence of bleeding. Pernicious anemia: Check a G41, folic acid Crackles at the bases, stable, no sx, recommend observation Flu shot today RTC 4-5 months  b

## 2016-06-09 ENCOUNTER — Other Ambulatory Visit: Payer: Self-pay | Admitting: Internal Medicine

## 2016-06-29 ENCOUNTER — Telehealth: Payer: Self-pay | Admitting: *Deleted

## 2016-06-29 NOTE — Telephone Encounter (Signed)
Appt scheduled for 07/10/16 @2 .

## 2016-07-07 NOTE — Progress Notes (Signed)
Subjective:   [name] is a 82 y.o. female who presents for Medicare Annual (Subsequent) preventive examination.  Review of Systems:  No ROS.  Medicare Wellness Visit. Cardiac Risk Factors include: advanced age (>64men, >38 women);hypertension;obesity (BMI >30kg/m2);sedentary lifestyle Sleep patterns: Sleeps about 7-8 hrs. Wakes up once every night to urinate. Feels rested. Home Safety/Smoke Alarms: Smoke alarms in place. Living environment; residence and Firearm Safety: Lives alone. 1 story home. No guns. Feels safe. Has medical alert necklace. Seat Belt Safety/Bike Helmet: Wear seatbelt.    Counseling:   Eye Exam- Wears glasses all the time. Follow's with Dr.Walter annually. Dental- Dr.Woolum every 6 months.   Female:   Pap- hysterectomy.      Mammo- No longer getting mammograms per pt.      Dexa scan-  Last on 09/11/14: no results on file.      CCS- Last 09/28/05:normal. F/u as needed per report.      Objective:     Vitals: BP 140/88 (BP Location: Left Arm, Patient Position: Sitting, Cuff Size: Large)   Pulse 98   Ht 5\' 4"  (1.626 m)   Wt 178 lb 9.6 oz (81 kg)   SpO2 98%   BMI 30.66 kg/m   Body mass index is 30.66 kg/m.   Tobacco History  Smoking Status  . Never Smoker  Smokeless Tobacco  . Never Used     Counseling given: No   Past Medical History:  Diagnosis Date  . AV block, 1st degree   . Bradycardia   . CAD (coronary artery disease)    Non ST elevation 2005 ; LHC in 6/05 in setting of NSTEMI: ant apical AK and inf-apical AK, EF 29%, dOM2 70-80% (small); findings c/w Tako-Tsubo CM  . Cardiomyopathy, nonischemic ([place]) 04/2008   Likely Tako-Tsubo. Resolved. --dx'd on cath 2005. EF 29% with minimal distal CAD (small OM1 70-80).  Echo 10/09 EF 55%;  echo 01/26/12: EF 55%, mild LAE, PASP 34.  . Colitis, ischemic ([place])   . Complication of anesthesia    post anesthesia excessive somnulence  . Compression fracture of C-spine ([place]) 10/10/2011   Fell at  home, tx at [facility]  . Diarrhea associated with pseudomembranous colitis 6/11-17/2013   [facility]  . Dyslipidemia   . Fall    with non-healing rib fractures  . Glaucoma   . Granuloma annulare   . History of phlebitis   . Hypertension    SEVERE  . Idiopathic thrombocytopenic purpura (ITP)   . Lower extremity edema   . Lumbar stenosis 2004   DR. MARK ROY  . Multiple pelvic fractures ([place]) 05/2012   Dr Adaline Sill , [facility]  . Osteoporosis    Past Surgical History:  Procedure Laterality Date  . ABDOMINAL HYSTERECTOMY    . APPENDECTOMY    . BACK SURGERY  10/2004   LS Disc 4-5 no sx.  Marland Kitchen CARDIAC CATHETERIZATION     12/2003  . CATARACT EXTRACTION, BILATERAL    . COLONOSCOPY  09/2005   adenoma  . COLONOSCOPY W/ POLYPECTOMY    . 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 rectal bleed  05-01-06   colonoscopy-ischemic colitis  . ROTATOR CUFF REPAIR    . VERTEBROPLASTY     Dr Gladstone Lighter   Family History  Problem Relation Age of Onset  . Coronary artery disease Mother   . Heart failure Mother   . Renal cancer Mother   . Kidney disease Mother   . Stroke Father  in his 72s  . Colon cancer Father   . Breast cancer Sister      X 2  . Rectal cancer Sister   . Breast cancer Sister   . Diabetes Neg Hx    History  Sexual Activity  . Sexual activity: Not on file    Outpatient Encounter Prescriptions as of 07/10/2016  Medication Sig  . benazepril (LOTENSIN) 20 MG tablet Take 1 tablet (20 mg total) by mouth daily.  . beta carotene w/minerals (OCUVITE) tablet Take 1 tablet by mouth daily.   . brimonidine (ALPHAGAN) 0.2 % ophthalmic solution Place 1 drop into the right eye 2 (two) times daily.    . Calcium Carb-Cholecalciferol (CALCIUM 600 + D PO) Take 1 tablet by mouth daily.  . carboxymethylcellulose (REFRESH TEARS) 0.5 % SOLN Place 1 drop into both eyes 2 (two) times daily.   . carvedilol (COREG) 25 MG tablet Take 1 tablet (25 mg total) by mouth 2 (two) times daily with a meal.   . Cholecalciferol (VITAMIN D3) 3000 UNITS TABS Take 1 tablet by mouth daily.  Marland Kitchen denosumab (PROLIA) 60 MG/ML SOLN injection Inject 60 mg into the skin every 6 (six) months. Administer in upper arm, thigh, or abdomen  . diphenhydrAMINE (BENADRYL) 25 MG tablet Take 25 mg by mouth at bedtime.  . docusate sodium (COLACE) 100 MG capsule Take 100 mg by mouth daily as needed for mild constipation. Stool softener  . furosemide (LASIX) 40 MG tablet Take 1 tablet (40 mg total) by mouth daily.  . Misc Natural Products (OSTEO BI-FLEX ADV JOINT SHIELD) TABS Take 1 tablet by mouth daily.   Marland Kitchen omeprazole (PRILOSEC) 20 MG capsule Take 20 mg by mouth daily.    . timolol (BETIMOL) 0.5 % ophthalmic solution Place 1 drop into the right eye 2 (two) times daily.  . vitamin B-12 (CYANOCOBALAMIN) 1000 MCG tablet Take 1,000 mcg by mouth daily.  Marland Kitchen neomycin-polymyxin b-dexamethasone (MAXITROL) 3.5-10000-0.1 OINT Place 1 application into both eyes daily as needed (dry eyes).    No facility-administered encounter medications on file as of 07/10/2016.     Activities of Daily Living In your present state of health, do you have any difficulty performing the following activities: 07/10/2016 01/06/2016  Hearing? N N  Vision? N N  Difficulty concentrating or making decisions? N N  Walking or climbing stairs? Y Y  Dressing or bathing? N N  Doing errands, shopping? N N  Preparing Food and eating ? N -  Using the Toilet? N -  In the past six months, have you accidently leaked urine? Y -  Do you have problems with loss of bowel control? N -  Managing your Medications? N -  Managing your Finances? N -  Housekeeping or managing your Housekeeping? N -  Some recent data might be hidden    Patient Care Team: Colon Branch, MD as PCP - General (Internal Medicine) Jolaine Artist, MD as Attending Physician (Cardiology) Bobette Mo, MD as Referring Physician (Ophthalmology) Sandria Senter, FNP as Referring Physician  (Orthopedic Surgery)    Assessment:    Physical assessment deferred to PCP.  Exercise Activities and Dietary recommendations Current Exercise Habits: The patient does not participate in regular exercise at present, Exercise limited by: None identified Diet (meal preparation, eat out, water intake, caffeinated beverages, dairy products, fruits and vegetables): in general, a "healthy" diet  , on average, 3 meals per day, low fat/ cholesterol  Goals    . Maintain lifestyle  Fall Risk Fall Risk  07/10/2016 01/06/2016 10/28/2015 04/09/2015 12/07/2014  Falls in the past year? No No No No No   Depression Screen PHQ 2/9 Scores 07/10/2016 01/06/2016 10/28/2015 04/09/2015  PHQ - 2 Score 0 0 0 0     Cognitive Function MMSE - Mini Mental State Exam 07/10/2016  Orientation to time 5  Orientation to Place 5  Registration 3  Attention/ Calculation 5  Recall 3  Language- name 2 objects 2  Language- repeat 1  Language- follow 3 step command 3  Language- read & follow direction 1  Write a sentence 1  Copy design 1  Total score 30        Immunization History  Administered Date(s) Administered  . Influenza Split 04/12/2012, 03/05/2015  . Influenza Whole 05/04/1997, 03/13/2010  . Influenza, High Dose Seasonal PF 05/11/2016  . Influenza-Unspecified 03/13/2014  . Pneumococcal Conjugate-13 12/07/2014  . Pneumococcal Polysaccharide-23 12/14/2007  . Td 07/14/2011   Screening Tests Health Maintenance  Topic Date Due  . ZOSTAVAX  10/03/2016 (Originally 09/28/1984)  . TETANUS/TDAP  07/13/2021  . INFLUENZA VACCINE  Completed  . DEXA SCAN  Completed  . PNA vac Low Risk Adult  Completed      Plan:     Follow up with Dr.Paz 10/12/16. Continue to eat heart healthy diet (full of fruits, vegetables, whole grains, lean protein, water--limit salt, fat, and sugar intake) and increase physical activity as tolerated. Bring a copy of your advance directives to your next office visit.  During the  course of the visit the patient was educated and counseled about the following appropriate screening and preventive services:   Vaccines to include Pneumoccal, Influenza, Hepatitis B, Td, Zostavax, HCV  Cardiovascular Disease  Colorectal cancer screening  Bone density screening  Diabetes screening  Glaucoma screening  Mammography/PAP  Nutrition counseling   Patient Instructions (the written plan) was given to the patient.   Shela Nevin, [place]  07/10/2016

## 2016-07-07 NOTE — Progress Notes (Signed)
Pre visit review using our clinic review tool, if applicable. No additional management support is needed unless otherwise documented below in the visit note. 

## 2016-07-10 ENCOUNTER — Encounter: Payer: Self-pay | Admitting: *Deleted

## 2016-07-10 ENCOUNTER — Ambulatory Visit (INDEPENDENT_AMBULATORY_CARE_PROVIDER_SITE_OTHER): Payer: Medicare Other | Admitting: *Deleted

## 2016-07-10 VITALS — BP 140/88 | HR 98 | Ht 64.0 in | Wt 178.6 lb

## 2016-07-10 DIAGNOSIS — Z0000 Encounter for general adult medical examination without abnormal findings: Secondary | ICD-10-CM

## 2016-07-10 NOTE — Patient Instructions (Signed)
Follow up with Dr.Paz 10/12/16. Continue to eat heart healthy diet (full of fruits, vegetables, whole grains, lean protein, water--limit salt, fat, and sugar intake) and increase physical activity as tolerated. Bring a copy of your advance directives to your next office visit.

## 2016-07-14 NOTE — Progress Notes (Signed)
Agree with above.   [place], DO 07/14/16 2:55 PM

## 2016-08-09 DIAGNOSIS — I509 Heart failure, unspecified: Secondary | ICD-10-CM | POA: Diagnosis not present

## 2016-08-09 DIAGNOSIS — I2510 Atherosclerotic heart disease of native coronary artery without angina pectoris: Secondary | ICD-10-CM | POA: Diagnosis not present

## 2016-08-09 DIAGNOSIS — K219 Gastro-esophageal reflux disease without esophagitis: Secondary | ICD-10-CM | POA: Diagnosis not present

## 2016-08-09 DIAGNOSIS — M549 Dorsalgia, unspecified: Secondary | ICD-10-CM | POA: Diagnosis not present

## 2016-08-09 DIAGNOSIS — I160 Hypertensive urgency: Secondary | ICD-10-CM | POA: Diagnosis not present

## 2016-08-09 DIAGNOSIS — R079 Chest pain, unspecified: Secondary | ICD-10-CM | POA: Diagnosis not present

## 2016-08-09 DIAGNOSIS — I10 Essential (primary) hypertension: Secondary | ICD-10-CM | POA: Diagnosis not present

## 2016-08-09 DIAGNOSIS — Z885 Allergy status to narcotic agent status: Secondary | ICD-10-CM | POA: Diagnosis not present

## 2016-08-09 DIAGNOSIS — Z79899 Other long term (current) drug therapy: Secondary | ICD-10-CM | POA: Diagnosis not present

## 2016-08-09 DIAGNOSIS — I482 Chronic atrial fibrillation: Secondary | ICD-10-CM | POA: Diagnosis not present

## 2016-08-09 DIAGNOSIS — R531 Weakness: Secondary | ICD-10-CM | POA: Diagnosis not present

## 2016-08-09 DIAGNOSIS — J90 Pleural effusion, not elsewhere classified: Secondary | ICD-10-CM | POA: Diagnosis not present

## 2016-08-09 DIAGNOSIS — Z888 Allergy status to other drugs, medicaments and biological substances status: Secondary | ICD-10-CM | POA: Diagnosis not present

## 2016-08-09 DIAGNOSIS — Z880 Allergy status to penicillin: Secondary | ICD-10-CM | POA: Diagnosis not present

## 2016-08-09 DIAGNOSIS — R110 Nausea: Secondary | ICD-10-CM | POA: Diagnosis not present

## 2016-08-10 DIAGNOSIS — Z880 Allergy status to penicillin: Secondary | ICD-10-CM | POA: Diagnosis not present

## 2016-08-10 DIAGNOSIS — R079 Chest pain, unspecified: Secondary | ICD-10-CM | POA: Diagnosis not present

## 2016-08-10 DIAGNOSIS — I10 Essential (primary) hypertension: Secondary | ICD-10-CM | POA: Diagnosis not present

## 2016-08-10 DIAGNOSIS — J90 Pleural effusion, not elsewhere classified: Secondary | ICD-10-CM | POA: Diagnosis not present

## 2016-08-10 DIAGNOSIS — I517 Cardiomegaly: Secondary | ICD-10-CM | POA: Diagnosis not present

## 2016-08-10 DIAGNOSIS — I509 Heart failure, unspecified: Secondary | ICD-10-CM | POA: Diagnosis not present

## 2016-08-10 DIAGNOSIS — Z888 Allergy status to other drugs, medicaments and biological substances status: Secondary | ICD-10-CM | POA: Diagnosis not present

## 2016-08-10 DIAGNOSIS — Z79899 Other long term (current) drug therapy: Secondary | ICD-10-CM | POA: Diagnosis not present

## 2016-08-10 DIAGNOSIS — I088 Other rheumatic multiple valve diseases: Secondary | ICD-10-CM | POA: Diagnosis not present

## 2016-08-10 DIAGNOSIS — I482 Chronic atrial fibrillation: Secondary | ICD-10-CM | POA: Diagnosis not present

## 2016-08-10 DIAGNOSIS — R531 Weakness: Secondary | ICD-10-CM | POA: Diagnosis not present

## 2016-08-10 DIAGNOSIS — I5189 Other ill-defined heart diseases: Secondary | ICD-10-CM | POA: Diagnosis not present

## 2016-08-10 DIAGNOSIS — R110 Nausea: Secondary | ICD-10-CM | POA: Diagnosis not present

## 2016-08-10 DIAGNOSIS — R000 Tachycardia, unspecified: Secondary | ICD-10-CM | POA: Diagnosis not present

## 2016-08-10 DIAGNOSIS — I2510 Atherosclerotic heart disease of native coronary artery without angina pectoris: Secondary | ICD-10-CM | POA: Diagnosis not present

## 2016-08-10 DIAGNOSIS — I160 Hypertensive urgency: Secondary | ICD-10-CM | POA: Diagnosis not present

## 2016-08-10 DIAGNOSIS — K219 Gastro-esophageal reflux disease without esophagitis: Secondary | ICD-10-CM | POA: Diagnosis not present

## 2016-09-29 DIAGNOSIS — M810 Age-related osteoporosis without current pathological fracture: Secondary | ICD-10-CM | POA: Diagnosis not present

## 2016-09-29 DIAGNOSIS — M899 Disorder of bone, unspecified: Secondary | ICD-10-CM | POA: Diagnosis not present

## 2016-09-29 DIAGNOSIS — Z9181 History of falling: Secondary | ICD-10-CM | POA: Diagnosis not present

## 2016-09-29 DIAGNOSIS — M8580 Other specified disorders of bone density and structure, unspecified site: Secondary | ICD-10-CM | POA: Diagnosis not present

## 2016-10-12 ENCOUNTER — Ambulatory Visit (INDEPENDENT_AMBULATORY_CARE_PROVIDER_SITE_OTHER): Payer: Medicare Other | Admitting: Internal Medicine

## 2016-10-12 ENCOUNTER — Encounter: Payer: Self-pay | Admitting: Internal Medicine

## 2016-10-12 VITALS — BP 138/82 | HR 102 | Temp 98.1°F | Resp 14 | Ht 64.0 in | Wt 181.2 lb

## 2016-10-12 DIAGNOSIS — E538 Deficiency of other specified B group vitamins: Secondary | ICD-10-CM | POA: Diagnosis not present

## 2016-10-12 DIAGNOSIS — M810 Age-related osteoporosis without current pathological fracture: Secondary | ICD-10-CM

## 2016-10-12 DIAGNOSIS — D693 Immune thrombocytopenic purpura: Secondary | ICD-10-CM | POA: Diagnosis not present

## 2016-10-12 DIAGNOSIS — I10 Essential (primary) hypertension: Secondary | ICD-10-CM

## 2016-10-12 LAB — CBC WITH DIFFERENTIAL/PLATELET
BASOS ABS: 0 10*3/uL (ref 0.0–0.1)
Basophils Relative: 0.7 % (ref 0.0–3.0)
EOS ABS: 0.3 10*3/uL (ref 0.0–0.7)
Eosinophils Relative: 5.6 % — ABNORMAL HIGH (ref 0.0–5.0)
HCT: 34 % — ABNORMAL LOW (ref 36.0–46.0)
Hemoglobin: 11.6 g/dL — ABNORMAL LOW (ref 12.0–15.0)
LYMPHS ABS: 1.5 10*3/uL (ref 0.7–4.0)
LYMPHS PCT: 25.6 % (ref 12.0–46.0)
MCHC: 34.1 g/dL (ref 30.0–36.0)
MCV: 95.5 fl (ref 78.0–100.0)
Monocytes Absolute: 0.4 10*3/uL (ref 0.1–1.0)
Monocytes Relative: 6.9 % (ref 3.0–12.0)
NEUTROS ABS: 3.6 10*3/uL (ref 1.4–7.7)
NEUTROS PCT: 61.2 % (ref 43.0–77.0)
PLATELETS: 61 10*3/uL — AB (ref 150.0–400.0)
RBC: 3.56 Mil/uL — ABNORMAL LOW (ref 3.87–5.11)
RDW: 14.2 % (ref 11.5–15.5)
WBC: 5.9 10*3/uL (ref 4.0–10.5)

## 2016-10-12 LAB — BASIC METABOLIC PANEL
BUN: 20 mg/dL (ref 6–23)
CHLORIDE: 108 meq/L (ref 96–112)
CO2: 27 mEq/L (ref 19–32)
Calcium: 9.1 mg/dL (ref 8.4–10.5)
Creatinine, Ser: 1.17 mg/dL (ref 0.40–1.20)
GFR: 45.98 mL/min — ABNORMAL LOW (ref >60.00)
Glucose, Bld: 96 mg/dL (ref 70–99)
POTASSIUM: 4.3 meq/L (ref 3.5–5.1)
SODIUM: 142 meq/L (ref 135–145)

## 2016-10-12 LAB — VITAMIN B12: Vitamin B-12: >1500 pg/mL — ABNORMAL HIGH (ref 211–911)

## 2016-10-12 NOTE — Patient Instructions (Signed)
GO TO THE LAB : Get the blood work     GO TO THE FRONT DESK Schedule your next appointment for a   checkup in 4 months  

## 2016-10-12 NOTE — Progress Notes (Signed)
Pre visit review using our clinic review tool, if applicable. No additional management support is needed unless otherwise documented below in the visit note. 

## 2016-10-12 NOTE — Progress Notes (Signed)
Subjective:    Patient ID: [name], female    DOB: April 05, 1923, 83 y.o.   MRN: [id number]  DOS:  10/12/2016 Type of visit - description :  Interval history:  Admitted to an outside hospital 08/09/2016 for 1 day: She developed generalized weakness and  substernal CP radiating to her back + Mild nausea and vomiting. At the ER she was hypertensive, she was given labetalol and  IV nitroprusside for a short period of time. Cardiac enzymes negative., BNP 1600 DX with a hypertensive urgency without clear explanation. Was discharged home. Labs reviewed  Review of Systems Since she left the hospital she is feeling better. Denies fever, chills. No chest pain. Occasional lower extremity edema. She still gets very tired, "weak all over". She needs to sit down and rest while doing ADLs Denies depression, anxiety, headache, visual disturbances.   Past Medical History:  Diagnosis Date  . AV block, 1st degree   . Bradycardia   . CAD (coronary artery disease)    Non ST elevation 2005 ; LHC in 6/05 in setting of NSTEMI: ant apical AK and inf-apical AK, EF 29%, dOM2 70-80% (small); findings c/w Tako-Tsubo CM  . Cardiomyopathy, nonischemic ([place]) 04/2008   Likely Tako-Tsubo. Resolved. --dx'd on cath 2005. EF 29% with minimal distal CAD (small OM1 70-80).  Echo 10/09 EF 55%;  echo 01/26/12: EF 55%, mild LAE, PASP 34.  . Colitis, ischemic ([place])   . Complication of anesthesia    post anesthesia excessive somnulence  . Compression fracture of C-spine ([place]) 10/10/2011   Fell at home, tx at [facility]  . Diarrhea associated with pseudomembranous colitis 6/11-17/2013   [facility]  . Dyslipidemia   . Fall    with non-healing rib fractures  . Glaucoma   . Granuloma annulare   . History of phlebitis   . Hypertension    SEVERE  . Idiopathic thrombocytopenic purpura (ITP)   . Lower extremity edema   . Lumbar stenosis 2004   DR. MARK ROY  . Multiple pelvic fractures ([place]) 05/2012   Dr  Adaline Sill , [facility]  . Osteoporosis     Past Surgical History:  Procedure Laterality Date  . ABDOMINAL HYSTERECTOMY    . APPENDECTOMY    . BACK SURGERY  10/2004   LS Disc 4-5 no sx.  Marland Kitchen CARDIAC CATHETERIZATION     12/2003  . CATARACT EXTRACTION, BILATERAL    . COLONOSCOPY  09/2005   adenoma  . COLONOSCOPY W/ POLYPECTOMY    . 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 rectal bleed  05-01-06   colonoscopy-ischemic colitis  . ROTATOR CUFF REPAIR    . VERTEBROPLASTY     Dr Gladstone Lighter    Social History   Social History  . Marital status: Widowed    Spouse name: N/A  . Number of children: 1  . Years of education: N/A   Occupational History  . retired Retired   Social History Main Topics  . Smoking status: Never Smoker  . Smokeless tobacco: Never Used  . Alcohol use No  . Drug use: No  . Sexual activity: Not on file   Other Topics Concern  . Not on file   Social History Narrative   Widowed   Lives by herself in a town house, drives    Son Tinsleigh Slovacek          Allergies as of 10/12/2016      Reactions   Morphine And Related Anaphylaxis, Nausea Only  Per family violently ill   Oxycodone    Dizziness    Penicillins    joint edema Has patient had a PCN reaction causing immediate rash, facial/tongue/throat swelling, SOB or lightheadedness with hypotension: Unknown Has patient had a PCN reaction causing severe rash involving mucus membranes or skin necrosis: unknown Has patient had a PCN reaction that required hospitalization No Has patient had a PCN reaction occurring within the last 10 years: No If all of the above answers are "NO", then may proceed with Cephalosporin use.   Colchicine    REACTION: hair loss   Hydroxychloroquine Other (See Comments)   unknown   Norvasc [amlodipine Besylate]    Ofloxacin Other (See Comments)   unknown   Streptomycin Other (See Comments)   Unknown   Tramadol Itching   Alendronate Sodium Palpitations      Medication List         Accurate as of 10/12/16 11:59 PM. Always use your most recent med list.          benazepril 20 MG tablet Commonly known as:  LOTENSIN Take 1 tablet (20 mg total) by mouth daily.   beta carotene w/minerals tablet Take 1 tablet by mouth daily.   brimonidine 0.2 % ophthalmic solution Commonly known as:  ALPHAGAN Place 1 drop into the right eye 2 (two) times daily.   CALCIUM 600 + D PO Take 1 tablet by mouth daily.   carvedilol 25 MG tablet Commonly known as:  COREG Take 1 tablet (25 mg total) by mouth 2 (two) times daily with a meal.   denosumab 60 MG/ML Soln injection Commonly known as:  PROLIA Inject 60 mg into the skin every 6 (six) months. Administer in upper arm, thigh, or abdomen   diphenhydrAMINE 25 MG tablet Commonly known as:  BENADRYL Take 25 mg by mouth at bedtime.   docusate sodium 100 MG capsule Commonly known as:  COLACE Take 100 mg by mouth daily as needed for mild constipation. Stool softener   furosemide 40 MG tablet Commonly known as:  LASIX Take 1 tablet (40 mg total) by mouth daily.   neomycin-polymyxin b-dexamethasone 3.5-10000-0.1 Oint Commonly known as:  MAXITROL Place 1 application into both eyes daily as needed (dry eyes).   omeprazole 20 MG capsule Commonly known as:  PRILOSEC Take 20 mg by mouth daily.   OSTEO BI-FLEX ADV JOINT SHIELD Tabs Take 1 tablet by mouth daily.   REFRESH TEARS 0.5 % Soln Generic drug:  carboxymethylcellulose Place 1 drop into both eyes 2 (two) times daily.   timolol 0.5 % ophthalmic solution Commonly known as:  BETIMOL Place 1 drop into the right eye 2 (two) times daily.   vitamin B-12 1000 MCG tablet Commonly known as:  CYANOCOBALAMIN Take 1,000 mcg by mouth daily.   Vitamin D3 3000 units Tabs Take 1 tablet by mouth daily.          Objective:   Physical Exam BP 138/82 (BP Location: Left Arm, Patient Position: Sitting, Cuff Size: Normal)   Pulse (!) 102   Temp 98.1 F (36.7 C) (Oral)   Resp 14    Ht _0  (1.626 m)   Wt 181 lb 4 oz (82.2 kg)   SpO2 95%   BMI 31.11 kg/m  General:   Well developed, well nourished, elderly lady in no distress HEENT:  Normocephalic . Face symmetric, atraumatic Lungs:  CTA B Normal respiratory effort, no intercostal retractions, no accessory muscle use. Heart: RRR,  no murmur.  +/+++  pretibial edema bilaterally  Skin: Not pale. Not jaundice Neurologic:  alert & oriented X3.  Speech normal, gait appropriate for age and unassisted Psych--  Cognition and judgment appear intact.  Cooperative with normal attention span and concentration.  Behavior appropriate. No anxious or depressed appearing.      Assessment & Plan:   Assessment > HTN severe CKD: creat ~1.4, 1.5 Dyslipidemia Hematology: --ITP-- hematology visit for 11-05-14, released to the care of PCP , check CBCs every 4 months, consult hematology if platelets <50 K --Pernicious anemia --Anemia of chronic disease CV: --CAD --Tako Tsubo cardiomyopathy --Diastolic  CHF, resolved - used to see Dr Missy Sabins --AV block first-degree DJD: --Compression fractures --Lumbar stenosis Dr. Carloyn Manner --Multiple pelvic fractures , cervical spine compression  fracture 2013 [facility] --H/o nonhealing rib  Osteoporosis: Dr. Agapito Games; s/p forteo, s/p  Prolia 09-2015. Last DEXA 09-29-16 OPHT: --Glaucoma  --S/p cornea transplant -- mac degeneration h/o granuloma annulare   H/o pseudomembranous colitis 2013  PLAN: Labs 07-2016 during an admission for chest pain and hypertensive urgency. Total cholesterol 150, LDL 94 Creat 1.0, potassium 4.5, LFTs normal. White count 5.6, hemoglobin 11.2, platelet count 50  CP: Resolved Generalized weakness, fatigue w/ ADLs Likely multifactorial. At the present time w/ no CP or fluid overload. No depression, fever, headaches.Will rec observation for now. HTN: Recently admitted with hypertensive urgency, medications were no change, ambulatory BPs reportedly normal around  130/70. BP today is very good. Check a BMP. Continue carvedilol, Lasix, benazepril. ITP: Recheck a CBC. Osteoporosis: Like her Ca+ checked. Will check a BMP today B12 deficiency: based on the last B12 (lower side of normal) we increase her B12 supplements. Labs today Despite weakness , she seems to be doing well , still living independently and driving. RTC 4 months

## 2016-10-13 NOTE — Assessment & Plan Note (Signed)
Labs 07-2016 during an admission for chest pain and hypertensive urgency. Total cholesterol 150, LDL 94 Creat 1.0, potassium 4.5, LFTs normal. White count 5.6, hemoglobin 11.2, platelet count 50  CP: Resolved Generalized weakness, fatigue w/ ADLs Likely multifactorial. At the present time w/ no CP or fluid overload. No depression, fever, headaches.Will rec observation for now. HTN: Recently admitted with hypertensive urgency, medications were no change, ambulatory BPs reportedly normal around 130/70. BP today is very good. Check a BMP. Continue carvedilol, Lasix, benazepril. ITP: Recheck a CBC. Osteoporosis: Like her Ca+ checked. Will check a BMP today B12 deficiency: based on the last B12 (lower side of normal) we increase her B12 supplements. Labs today Despite weakness , she seems to be doing well , still living independently and driving. RTC 4 months

## 2016-10-26 ENCOUNTER — Telehealth (HOSPITAL_COMMUNITY): Payer: Self-pay | Admitting: *Deleted

## 2016-10-26 NOTE — Telephone Encounter (Signed)
Patient called in reporting that over the weekend she was in [place] and she felt a "flutter" in her heart and she became very dizzy.  She stated that she just hasn't felt good since and wanted to see if she can make an appointment.  I made Barrington Ellison, PA aware and I have added her to his schedule tomorrow at 11:00am.  Patient is aware of appointment.

## 2016-10-27 ENCOUNTER — Ambulatory Visit (HOSPITAL_COMMUNITY)
Admission: RE | Admit: 2016-10-27 | Discharge: 2016-10-27 | Disposition: A | Discharge status: Home / Self Care | Payer: Medicare Other | Source: Ambulatory Visit | Attending: Internal Medicine | Admitting: Internal Medicine

## 2016-10-27 VITALS — Wt 178.6 lb

## 2016-10-27 DIAGNOSIS — M48061 Spinal stenosis, lumbar region without neurogenic claudication: Secondary | ICD-10-CM | POA: Insufficient documentation

## 2016-10-27 DIAGNOSIS — H409 Unspecified glaucoma: Secondary | ICD-10-CM | POA: Insufficient documentation

## 2016-10-27 DIAGNOSIS — I471 Supraventricular tachycardia: Secondary | ICD-10-CM | POA: Diagnosis not present

## 2016-10-27 DIAGNOSIS — I252 Old myocardial infarction: Secondary | ICD-10-CM | POA: Diagnosis not present

## 2016-10-27 DIAGNOSIS — I429 Cardiomyopathy, unspecified: Secondary | ICD-10-CM | POA: Insufficient documentation

## 2016-10-27 DIAGNOSIS — R42 Dizziness and giddiness: Secondary | ICD-10-CM | POA: Diagnosis not present

## 2016-10-27 DIAGNOSIS — E785 Hyperlipidemia, unspecified: Secondary | ICD-10-CM | POA: Diagnosis not present

## 2016-10-27 DIAGNOSIS — I130 Hypertensive heart and chronic kidney disease with heart failure and stage 1 through stage 4 chronic kidney disease, or unspecified chronic kidney disease: Secondary | ICD-10-CM | POA: Insufficient documentation

## 2016-10-27 DIAGNOSIS — I5032 Chronic diastolic (congestive) heart failure: Secondary | ICD-10-CM

## 2016-10-27 DIAGNOSIS — I440 Atrioventricular block, first degree: Secondary | ICD-10-CM | POA: Insufficient documentation

## 2016-10-27 DIAGNOSIS — M810 Age-related osteoporosis without current pathological fracture: Secondary | ICD-10-CM | POA: Insufficient documentation

## 2016-10-27 DIAGNOSIS — I2510 Atherosclerotic heart disease of native coronary artery without angina pectoris: Secondary | ICD-10-CM | POA: Diagnosis not present

## 2016-10-27 DIAGNOSIS — N189 Chronic kidney disease, unspecified: Secondary | ICD-10-CM | POA: Diagnosis not present

## 2016-10-27 DIAGNOSIS — I10 Essential (primary) hypertension: Secondary | ICD-10-CM

## 2016-10-27 DIAGNOSIS — R002 Palpitations: Secondary | ICD-10-CM | POA: Diagnosis not present

## 2016-10-27 LAB — CBC
HEMATOCRIT: 34.1 % — AB (ref 36.0–46.0)
HEMOGLOBIN: 11.7 g/dL — AB (ref 12.0–15.0)
MCH: 31.6 pg (ref 26.0–34.0)
MCHC: 34.3 g/dL (ref 30.0–36.0)
MCV: 92.2 fL (ref 78.0–100.0)
Platelets: 65 10*3/uL — ABNORMAL LOW (ref 150–400)
RBC: 3.7 MIL/uL — ABNORMAL LOW (ref 3.87–5.11)
RDW: 13.4 % (ref 11.5–15.5)
WBC: 5.1 10*3/uL (ref 4.0–10.5)

## 2016-10-27 LAB — BASIC METABOLIC PANEL
ANION GAP: 7 (ref 5–15)
BUN: 20 mg/dL (ref 6–20)
CO2: 27 mmol/L (ref 22–32)
Calcium: 9.3 mg/dL (ref 8.9–10.3)
Chloride: 105 mmol/L (ref 101–111)
Creatinine, Ser: 1.21 mg/dL — ABNORMAL HIGH (ref 0.44–1.00)
GFR calc non Af Amer: 38 mL/min — ABNORMAL LOW (ref >60)
GFR, EST AFRICAN AMERICAN: 44 mL/min — AB (ref >60)
Glucose, Bld: 103 mg/dL — ABNORMAL HIGH (ref 65–99)
POTASSIUM: 3.8 mmol/L (ref 3.5–5.1)
Sodium: 139 mmol/L (ref 135–145)

## 2016-10-27 LAB — MAGNESIUM: MAGNESIUM: 1.8 mg/dL (ref 1.7–2.4)

## 2016-10-27 NOTE — Progress Notes (Signed)
Advanced Heart Failure Medication Review by a Pharmacist  Does the patient  feel that his/her medications are working for him/her?  yes  Has the patient been experiencing any side effects to the medications prescribed?  no  Does the patient measure his/her own blood pressure or blood glucose at home?  yes   Does the patient have any problems obtaining medications due to transportation or finances?   no  Understanding of regimen: good Understanding of indications: good Potential of compliance: good Patient understands to avoid NSAIDs. Patient understands to avoid decongestants.  Issues to address at subsequent visits: None   Pharmacist comments: Ms. Worsley is a pleasant 83 yo F presenting with a current medication list. She reports great compliance with her regimen and did not have any specific medication-related questions or concerns for me at this time.   Ruta Hinds. Velva Harman, PharmD, BCPS, CPP Clinical Pharmacist Pager: [phone] Phone: [phone] 10/27/2016 12:22 PM      Time with patient: 10 minutes Preparation and documentation time: 2 minutes Total time: 12 minutes

## 2016-10-27 NOTE — Patient Instructions (Signed)
Routine lab work today. Will notify you of abnormal results, otherwise no news is good news!  No changes to medication at this time.  You have been scheduled to have a [place] placed. Address: [address] ([address] Floor), [place], [place] [address]  Phone: [phone] They will call you to schedule this appointment.  Follow up with Dr. Haroldine Laws in 6-8 weeks. We will call you to schedule this appointment closer to time.  Do the following things EVERYDAY: 1) Weigh yourself in the morning before breakfast. Write it down and keep it in a log. 2) Take your medicines as prescribed 3) Eat low salt foods-Limit salt (sodium) to 2000 mg per day.  4) Stay as active as you can everyday 5) Limit all fluids for the day to less than 2 liters

## 2016-10-27 NOTE — Progress Notes (Signed)
Patient ID: [name], female   DOB: 1923/01/11, 83 y.o.   MRN: [id number]     Advanced Heart Failure Clinic Note   PCP: Dr Linna Darner HF: Dr. Haroldine Laws   HPI: [name] is a 83 y.o. 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 LHC in 6/05 in setting of NSTEMI: ant apical AK and inf-apical AK, EF 29%, dOM2 70-80% (small). EF has recovered. Also has a h/o HTN, SVT and fractures pelvis 05/2012. 02/2012  Discharged form [place] after a fall which resulted in a cervical fracture.   01/2012 ECHO EF 55% 08/14/13 ECHO EF 55%, normal RV size and systolic function.   Pt presents today for add on for SOB and "dizzy spells" over the weekend. They are different from her usual vertiginous spells.  She states they are more like the energy draining out of her and her vision goes black. She is unsure if it is instant or like a curtain coming down over her eyes. She feels tired afterward. She has had a "bad feeling" as well as fluttering in her chest. More SOB with these episodes, but at baseline can get around a grocery store ok. She does have occasional lightheadedness with standing. No falls. Does feel near-syncope with some of these episodes. Taking all medications as directed. She denies having lost consciousness at any time during these episodes. She has only had 1 episode in the past week. She is unsure if they happen as often as once a month.   ECG today, personally reviewed: NSR 98 bpm with 1st degree AV block. PR interval 240.  Labs (1/15): K 4.3, creatinine 1.8, BNP 49 Labs (6/17): K 4.5, creatinine 1.14 Labs (9/17): K 4.5, Creatinine 1.38 Labs (10/12/16) LK 4.3, Creatinine 1.17  Review of systems complete and found to be negative unless listed in HPI.    Past Medical History:  Diagnosis Date  . AV block, 1st degree   . Bradycardia   . CAD (coronary artery disease)    Non ST elevation 2005 ; LHC in 6/05 in setting of NSTEMI: ant apical AK and inf-apical AK, EF 29%,  dOM2 70-80% (small); findings c/w Tako-Tsubo CM  . Cardiomyopathy, nonischemic ([place]) 04/2008   Likely Tako-Tsubo. Resolved. --dx'd on cath 2005. EF 29% with minimal distal CAD (small OM1 70-80).  Echo 10/09 EF 55%;  echo 01/26/12: EF 55%, mild LAE, PASP 34.  . Colitis, ischemic ([place])   . Complication of anesthesia    post anesthesia excessive somnulence  . Compression fracture of C-spine ([place]) 10/10/2011   Fell at home, tx at [facility]  . Diarrhea associated with pseudomembranous colitis 6/11-17/2013   [facility]  . Dyslipidemia   . Fall    with non-healing rib fractures  . Glaucoma   . Granuloma annulare   . History of phlebitis   . Hypertension    SEVERE  . Idiopathic thrombocytopenic purpura (ITP)   . Lower extremity edema   . Lumbar stenosis 2004   DR. MARK ROY  . Multiple pelvic fractures 05/2012   Dr Adaline Sill , [facility]  . Osteoporosis     Current Outpatient Prescriptions  Medication Sig Dispense Refill  . benazepril (LOTENSIN) 20 MG tablet Take 1 tablet (20 mg total) by mouth daily. 90 tablet 2  . beta carotene w/minerals (OCUVITE) tablet Take 1 tablet by mouth daily.     . brimonidine (ALPHAGAN) 0.2 % ophthalmic solution Place 1 drop into the right eye 2 (two) times daily.      Marland Kitchen  Calcium Carbonate-Vit D-Min (CALCIUM 1200 PO) Take 1,200 mg by mouth daily.    . carboxymethylcellulose (REFRESH TEARS) 0.5 % SOLN Place 1 drop into both eyes 2 (two) times daily.     . carvedilol (COREG) 25 MG tablet Take 1 tablet (25 mg total) by mouth 2 (two) times daily with a meal. 180 tablet 2  . Cholecalciferol (VITAMIN D3) 3000 UNITS TABS Take 1 tablet by mouth daily.    Marland Kitchen denosumab (PROLIA) 60 MG/ML SOLN injection Inject 60 mg into the skin every 6 (six) months. Administer in upper arm, thigh, or abdomen    . diphenhydrAMINE (BENADRYL) 25 MG tablet Take 25 mg by mouth at bedtime as needed for sleep.     Marland Kitchen docusate sodium (COLACE) 100 MG capsule Take 100 mg by mouth daily as needed for mild  constipation. Stool softener    . furosemide (LASIX) 40 MG tablet Take 1 tablet (40 mg total) by mouth daily. 90 tablet 2  . ibuprofen (ADVIL,MOTRIN) 200 MG tablet Take 200 mg by mouth daily.    . Misc Natural Products (OSTEO BI-FLEX ADV JOINT SHIELD) TABS Take 1 tablet by mouth daily.     Marland Kitchen omeprazole (PRILOSEC) 20 MG capsule Take 20 mg by mouth daily.      . timolol (TIMOPTIC) 0.25 % ophthalmic solution Place 1 drop into the right eye 2 (two) times daily.    . vitamin B-12 (CYANOCOBALAMIN) 1000 MCG tablet Take 2,000 mcg by mouth daily.      No current facility-administered medications for this encounter.      PHYSICAL EXAM: Vitals:   10/27/16 1152  SpO2: 97%  Weight: 178 lb 9.6 oz (81 kg)   Orthostatics 148/106 Seated 150/92 Standing  Wt Readings from Last 3 Encounters:  10/27/16 178 lb 9.6 oz (81 kg)  10/12/16 181 lb 4 oz (82.2 kg)  07/10/16 178 lb 9.6 oz (81 kg)     General: Elderly and fatigued appearing caucasian female. NAD.   HEENT: Normal Neck: Supple. JVP 6-7 cm. Carotids 2+ bilaterally; no bruits. No thyromegaly or nodule noted.  Cor: PMI non-displaced. RRR. No M/G/R. Lungs: Mildly diminished basilar sounds.   Abdomen: Soft, NT, ND, no HSM. No bruits or masses. +BS  Extremities: No cyanosis, clubbing, or rash. Trace ankle edema.  Neuro: Alert & oriented x 3. Cranial nerves grossly intact. Moves all 4 extremities w/o difficulty. Affect pleasant     ASSESSMENT & PLAN: 1. Chronic diastolic CHF: History of Takotsubo cardiomyopathy, EF has recovered. - Echo 08/14/13 EF 55-60% with normal RV.  - Continue Lasix 40 mg daily - Continue coreg 25 mg BID - Continue benazepril 20 mg daily.  - Reinforced fluid restriction to < 2 L daily, sodium restriction to less than 2000 mg daily, and the importance of daily weights.   2. CKD:  - BMET today.  3. CAD: No ischemic symptoms.  Nonobstructive CAD on prior cath.   - Denies CP. No change to current plan. 4. Ectopic atrial  rhythm:  - Noted on ECG at last visit 08/2013.  EKG today shows NSR - Continue coreg as above 5. "Dizzy" spells and Palpitations - Overall history concerning for arrhythmia.  Will place 30 day event monitor.  If having bradyarrhythmia may benefit from Pacemaker.  - If 30 day event negative, and recurs, could consider linq monitoring.   - Discussed with Dr. Haroldine Laws   Will follow up in 6-8 weeks s/p her 30 day event monitor.   Shirley Friar,  PA-C  10/27/2016  Greater than 50% of the 25 minute visit was spent in counseling/coordination of care regarding disease state education, medication reconciliation, and alarm symptoms for return.

## 2016-11-03 ENCOUNTER — Ambulatory Visit (INDEPENDENT_AMBULATORY_CARE_PROVIDER_SITE_OTHER): Payer: Medicare Other

## 2016-11-03 DIAGNOSIS — R42 Dizziness and giddiness: Secondary | ICD-10-CM | POA: Diagnosis not present

## 2016-11-04 ENCOUNTER — Telehealth (HOSPITAL_COMMUNITY): Payer: Self-pay

## 2016-11-04 ENCOUNTER — Telehealth: Payer: Self-pay | Admitting: Internal Medicine

## 2016-11-04 NOTE — Telephone Encounter (Signed)
Patient called to report she was notified by on call provider from event monitor company [employer] stating to call our office firs thing this morning for event on heart monitor. Received fax report showing 6.5 second pause.  Patient states she is symptomatic with this and does not feel well. Advised per Dr. Haroldine Laws to STOP Coreg and call our office if she feels no better or worsens, or to call 911 afterhours. Will schedule her to f/u Monday with Oda Kilts after stopping the Coreg per her request. Patient aware and agreeable to plan as stated above.  Renee Pain, RN

## 2016-11-04 NOTE — Telephone Encounter (Signed)
New Message     [employer] called the patient and not them with the results for event monitor , they need to know how to get in contact with [employer] so that they can get results

## 2016-11-04 NOTE — Telephone Encounter (Signed)
[employer] called the patient to see if she was symptomatic around 5:45 on 11-03-16.  I pulled the monitor strips and showed Dr. Trudee Grip). They showed a critical alert (afib, 3.0,6.5 pause). Dr. Radford Pax contacted [place] who will call the patient for instructions.

## 2016-11-06 ENCOUNTER — Encounter (HOSPITAL_COMMUNITY): Payer: Self-pay | Admitting: Emergency Medicine

## 2016-11-06 ENCOUNTER — Telehealth (HOSPITAL_COMMUNITY): Payer: Self-pay | Admitting: *Deleted

## 2016-11-06 ENCOUNTER — Emergency Department (HOSPITAL_COMMUNITY): Payer: Medicare Other

## 2016-11-06 ENCOUNTER — Ambulatory Visit (HOSPITAL_COMMUNITY)
Admission: EM | Disposition: A | Discharge status: Home / Self Care | Payer: Self-pay | Source: Home / Self Care | Attending: Emergency Medicine

## 2016-11-06 ENCOUNTER — Ambulatory Visit (HOSPITAL_COMMUNITY)
Admission: EM | Admit: 2016-11-06 | Discharge: 2016-11-07 | Disposition: A | Discharge status: Home / Self Care | Payer: Medicare Other | Attending: Internal Medicine | Admitting: Internal Medicine

## 2016-11-06 DIAGNOSIS — I442 Atrioventricular block, complete: Secondary | ICD-10-CM | POA: Diagnosis not present

## 2016-11-06 DIAGNOSIS — E785 Hyperlipidemia, unspecified: Secondary | ICD-10-CM | POA: Diagnosis not present

## 2016-11-06 DIAGNOSIS — R55 Syncope and collapse: Secondary | ICD-10-CM | POA: Diagnosis present

## 2016-11-06 DIAGNOSIS — Z950 Presence of cardiac pacemaker: Secondary | ICD-10-CM

## 2016-11-06 DIAGNOSIS — I252 Old myocardial infarction: Secondary | ICD-10-CM | POA: Diagnosis not present

## 2016-11-06 DIAGNOSIS — R000 Tachycardia, unspecified: Secondary | ICD-10-CM | POA: Diagnosis not present

## 2016-11-06 DIAGNOSIS — D693 Immune thrombocytopenic purpura: Secondary | ICD-10-CM | POA: Diagnosis not present

## 2016-11-06 DIAGNOSIS — I130 Hypertensive heart and chronic kidney disease with heart failure and stage 1 through stage 4 chronic kidney disease, or unspecified chronic kidney disease: Secondary | ICD-10-CM | POA: Insufficient documentation

## 2016-11-06 DIAGNOSIS — Z880 Allergy status to penicillin: Secondary | ICD-10-CM | POA: Diagnosis not present

## 2016-11-06 DIAGNOSIS — I2510 Atherosclerotic heart disease of native coronary artery without angina pectoris: Secondary | ICD-10-CM | POA: Diagnosis not present

## 2016-11-06 DIAGNOSIS — R0602 Shortness of breath: Secondary | ICD-10-CM | POA: Diagnosis not present

## 2016-11-06 DIAGNOSIS — I495 Sick sinus syndrome: Secondary | ICD-10-CM | POA: Insufficient documentation

## 2016-11-06 DIAGNOSIS — M48061 Spinal stenosis, lumbar region without neurogenic claudication: Secondary | ICD-10-CM | POA: Insufficient documentation

## 2016-11-06 DIAGNOSIS — R42 Dizziness and giddiness: Secondary | ICD-10-CM

## 2016-11-06 DIAGNOSIS — I5032 Chronic diastolic (congestive) heart failure: Secondary | ICD-10-CM | POA: Diagnosis not present

## 2016-11-06 DIAGNOSIS — I4891 Unspecified atrial fibrillation: Secondary | ICD-10-CM

## 2016-11-06 DIAGNOSIS — Z8249 Family history of ischemic heart disease and other diseases of the circulatory system: Secondary | ICD-10-CM | POA: Diagnosis not present

## 2016-11-06 DIAGNOSIS — I428 Other cardiomyopathies: Secondary | ICD-10-CM | POA: Insufficient documentation

## 2016-11-06 DIAGNOSIS — M810 Age-related osteoporosis without current pathological fracture: Secondary | ICD-10-CM | POA: Insufficient documentation

## 2016-11-06 DIAGNOSIS — I481 Persistent atrial fibrillation: Secondary | ICD-10-CM | POA: Insufficient documentation

## 2016-11-06 DIAGNOSIS — N183 Chronic kidney disease, stage 3 (moderate): Secondary | ICD-10-CM | POA: Insufficient documentation

## 2016-11-06 DIAGNOSIS — H409 Unspecified glaucoma: Secondary | ICD-10-CM | POA: Diagnosis not present

## 2016-11-06 HISTORY — PX: PACEMAKER IMPLANT: EP1218

## 2016-11-06 LAB — CBC
HCT: 35 % — ABNORMAL LOW (ref 36.0–46.0)
Hemoglobin: 12.1 g/dL (ref 12.0–15.0)
MCH: 32.1 pg (ref 26.0–34.0)
MCHC: 34.6 g/dL (ref 30.0–36.0)
MCV: 92.8 fL (ref 78.0–100.0)
PLATELETS: 68 10*3/uL — AB (ref 150–400)
RBC: 3.77 MIL/uL — AB (ref 3.87–5.11)
RDW: 14.1 % (ref 11.5–15.5)
WBC: 5.4 10*3/uL (ref 4.0–10.5)

## 2016-11-06 LAB — BASIC METABOLIC PANEL
Anion gap: 7 (ref 5–15)
BUN: 22 mg/dL — ABNORMAL HIGH (ref 6–20)
CALCIUM: 9.3 mg/dL (ref 8.9–10.3)
CHLORIDE: 110 mmol/L (ref 101–111)
CO2: 24 mmol/L (ref 22–32)
CREATININE: 1.22 mg/dL — AB (ref 0.44–1.00)
GFR calc non Af Amer: 37 mL/min — ABNORMAL LOW (ref >60)
GFR, EST AFRICAN AMERICAN: 43 mL/min — AB (ref >60)
GLUCOSE: 112 mg/dL — AB (ref 65–99)
Potassium: 3.8 mmol/L (ref 3.5–5.1)
Sodium: 141 mmol/L (ref 135–145)

## 2016-11-06 LAB — I-STAT TROPONIN, ED: TROPONIN I, POC: 0.02 ng/mL (ref 0.00–0.08)

## 2016-11-06 LAB — PROTIME-INR
INR: 1.07
Prothrombin Time: 14 seconds (ref 11.4–15.2)

## 2016-11-06 LAB — MAGNESIUM: Magnesium: 1.7 mg/dL (ref 1.7–2.4)

## 2016-11-06 LAB — TSH: TSH: 1.966 u[IU]/mL (ref 0.350–4.500)

## 2016-11-06 LAB — APTT: APTT: 26 s (ref 24–36)

## 2016-11-06 SURGERY — PACEMAKER IMPLANT

## 2016-11-06 MED ORDER — HEPARIN SODIUM (PORCINE) 1000 UNIT/ML IJ SOLN: INTRAMUSCULAR | Status: DC | PRN

## 2016-11-06 MED ORDER — PANTOPRAZOLE SODIUM 40 MG PO TBEC
40.0000 mg | DELAYED_RELEASE_TABLET | Freq: Every day | ORAL | Status: DC
Start: 2016-11-06 — End: 2016-11-07
  Administered 2016-11-06 – 2016-11-07 (×2): 40 mg via ORAL
  Filled 2016-11-06 (×2): qty 1

## 2016-11-06 MED ORDER — BRIMONIDINE TARTRATE 0.2 % OP SOLN
1.0000 [drp] | Freq: Two times a day (BID) | OPHTHALMIC | Status: DC
  Administered 2016-11-06 – 2016-11-07 (×2): 1 [drp] via OPHTHALMIC
  Filled 2016-11-06: qty 5

## 2016-11-06 MED ORDER — MIDAZOLAM HCL 5 MG/5ML IJ SOLN
INTRAMUSCULAR | Status: AC
  Filled 2016-11-06: qty 5

## 2016-11-06 MED ORDER — METOPROLOL TARTRATE 5 MG/5ML IV SOLN
INTRAVENOUS | Status: AC
  Filled 2016-11-06: qty 5

## 2016-11-06 MED ORDER — HEPARIN (PORCINE) IN NACL 2-0.9 UNIT/ML-% IJ SOLN
INTRAMUSCULAR | Status: DC | PRN
  Administered 2016-11-06: 1000 mL

## 2016-11-06 MED ORDER — HEPARIN (PORCINE) IN NACL 2-0.9 UNIT/ML-% IJ SOLN
INTRAMUSCULAR | Status: AC
  Filled 2016-11-06: qty 500

## 2016-11-06 MED ORDER — PROSIGHT PO TABS
1.0000 | ORAL_TABLET | Freq: Every day | ORAL | Status: DC
  Administered 2016-11-07: 1 via ORAL
  Filled 2016-11-06: qty 1

## 2016-11-06 MED ORDER — SODIUM CHLORIDE 0.9 % IV SOLN: INTRAVENOUS | Status: DC

## 2016-11-06 MED ORDER — HEPARIN BOLUS VIA INFUSION
4000.0000 [IU] | Freq: Once | INTRAVENOUS | Status: DC
  Filled 2016-11-06: qty 4000

## 2016-11-06 MED ORDER — GENTAMICIN SULFATE 40 MG/ML IJ SOLN: 80.0000 mg | INTRAMUSCULAR | Status: DC

## 2016-11-06 MED ORDER — SODIUM CHLORIDE 0.9 % IR SOLN
Status: AC
  Administered 2016-11-06: 18:00:00
  Filled 2016-11-06: qty 500000

## 2016-11-06 MED ORDER — SODIUM CHLORIDE 0.9 % IV BOLUS (SEPSIS)
500.0000 mL | Freq: Once | INTRAVENOUS | Status: AC
  Administered 2016-11-06: 500 mL via INTRAVENOUS

## 2016-11-06 MED ORDER — BENAZEPRIL HCL 20 MG PO TABS
20.0000 mg | ORAL_TABLET | Freq: Every day | ORAL | Status: DC
  Administered 2016-11-06 – 2016-11-07 (×2): 20 mg via ORAL
  Filled 2016-11-06 (×2): qty 1

## 2016-11-06 MED ORDER — CHLORHEXIDINE GLUCONATE 4 % EX LIQD: 60.0000 mL | Freq: Once | CUTANEOUS | Status: DC

## 2016-11-06 MED ORDER — DIPHENHYDRAMINE HCL 50 MG/ML IJ SOLN
INTRAMUSCULAR | Status: AC
  Filled 2016-11-06: qty 1

## 2016-11-06 MED ORDER — IBUPROFEN 200 MG PO TABS
200.0000 mg | ORAL_TABLET | Freq: Every day | ORAL | Status: DC | PRN
  Administered 2016-11-06: 200 mg via ORAL
  Filled 2016-11-06: qty 1

## 2016-11-06 MED ORDER — TIMOLOL MALEATE 0.25 % OP SOLN
1.0000 [drp] | Freq: Two times a day (BID) | OPHTHALMIC | Status: DC
  Administered 2016-11-06 – 2016-11-07 (×2): 1 [drp] via OPHTHALMIC
  Filled 2016-11-06: qty 5

## 2016-11-06 MED ORDER — VITAMIN D 1000 UNITS PO TABS
3000.0000 [IU] | ORAL_TABLET | Freq: Every day | ORAL | Status: DC
  Administered 2016-11-06 – 2016-11-07 (×2): 3000 [IU] via ORAL
  Filled 2016-11-06 (×2): qty 3

## 2016-11-06 MED ORDER — SODIUM CHLORIDE 0.9 % IR SOLN
Status: AC
  Filled 2016-11-06: qty 2

## 2016-11-06 MED ORDER — VITAMIN B-12 1000 MCG PO TABS
2000.0000 ug | ORAL_TABLET | Freq: Every day | ORAL | Status: DC
  Administered 2016-11-06 – 2016-11-07 (×2): 2000 ug via ORAL
  Filled 2016-11-06 (×2): qty 2

## 2016-11-06 MED ORDER — POLYVINYL ALCOHOL 1.4 % OP SOLN
1.0000 [drp] | Freq: Two times a day (BID) | OPHTHALMIC | Status: DC
  Administered 2016-11-06 – 2016-11-07 (×2): 1 [drp] via OPHTHALMIC
  Filled 2016-11-06: qty 15

## 2016-11-06 MED ORDER — DIPHENHYDRAMINE HCL 50 MG/ML IJ SOLN
INTRAMUSCULAR | Status: DC | PRN
  Administered 2016-11-06: 25 mg via INTRAVENOUS

## 2016-11-06 MED ORDER — FUROSEMIDE 40 MG PO TABS
40.0000 mg | ORAL_TABLET | Freq: Every day | ORAL | Status: DC
  Administered 2016-11-06: 40 mg via ORAL
  Filled 2016-11-06 (×2): qty 1

## 2016-11-06 MED ORDER — ACETAMINOPHEN 325 MG PO TABS
325.0000 mg | ORAL_TABLET | ORAL | Status: DC | PRN
  Administered 2016-11-06 – 2016-11-07 (×2): 650 mg via ORAL
  Filled 2016-11-06 (×2): qty 2

## 2016-11-06 MED ORDER — VANCOMYCIN HCL IN DEXTROSE 1-5 GM/200ML-% IV SOLN
1000.0000 mg | Freq: Two times a day (BID) | INTRAVENOUS | Status: AC
  Administered 2016-11-07: 1000 mg via INTRAVENOUS
  Filled 2016-11-06: qty 200

## 2016-11-06 MED ORDER — METOPROLOL TARTRATE 5 MG/5ML IV SOLN
INTRAVENOUS | Status: DC | PRN
  Administered 2016-11-06: 5 mg via INTRAVENOUS

## 2016-11-06 MED ORDER — VANCOMYCIN HCL IN DEXTROSE 1-5 GM/200ML-% IV SOLN
1000.0000 mg | INTRAVENOUS | Status: AC
  Administered 2016-11-06: 1000 mg via INTRAVENOUS

## 2016-11-06 MED ORDER — FENTANYL CITRATE (PF) 100 MCG/2ML IJ SOLN
INTRAMUSCULAR | Status: AC
  Filled 2016-11-06: qty 2

## 2016-11-06 MED ORDER — DENOSUMAB 60 MG/ML ~~LOC~~ SOLN: 60.0000 mg | SUBCUTANEOUS | Status: DC

## 2016-11-06 MED ORDER — OSTEO BI-FLEX ADV JOINT SHIELD PO TABS: 1.0000 | ORAL_TABLET | Freq: Every day | ORAL | Status: DC

## 2016-11-06 MED ORDER — DOCUSATE SODIUM 100 MG PO CAPS
100.0000 mg | ORAL_CAPSULE | Freq: Every day | ORAL | Status: DC
  Filled 2016-11-06: qty 1

## 2016-11-06 MED ORDER — DIPHENHYDRAMINE HCL 25 MG PO CAPS
25.0000 mg | ORAL_CAPSULE | Freq: Every evening | ORAL | Status: DC | PRN
  Administered 2016-11-06: 25 mg via ORAL
  Filled 2016-11-06 (×2): qty 1

## 2016-11-06 MED ORDER — SALINE SPRAY 0.65 % NA SOLN
1.0000 | NASAL | Status: DC | PRN
  Filled 2016-11-06: qty 44

## 2016-11-06 MED ORDER — LIDOCAINE HCL (PF) 1 % IJ SOLN
INTRAMUSCULAR | Status: DC | PRN
  Administered 2016-11-06: 50 mL

## 2016-11-06 MED ORDER — ONDANSETRON HCL 4 MG/2ML IJ SOLN: 4.0000 mg | Freq: Four times a day (QID) | INTRAMUSCULAR | Status: DC | PRN

## 2016-11-06 MED ORDER — HEPARIN (PORCINE) IN NACL 100-0.45 UNIT/ML-% IJ SOLN
1000.0000 [IU]/h | INTRAMUSCULAR | Status: DC
  Filled 2016-11-06: qty 250

## 2016-11-06 MED ORDER — VANCOMYCIN HCL IN DEXTROSE 1-5 GM/200ML-% IV SOLN
INTRAVENOUS | Status: AC
  Filled 2016-11-06: qty 200

## 2016-11-06 SURGICAL SUPPLY — 8 items
CABLE SURGICAL S-101-97-12 (CABLE) ×3 IMPLANT
GUIDEWIRE ANGLED .035X150CM (WIRE) ×3 IMPLANT
LEAD TENDRIL MRI 52CM LPA1200M (Lead) ×3 IMPLANT
LEAD TENDRIL MRI 58CM LPA1200M (Lead) ×3 IMPLANT
PACEMAKER ASSURITY DR-RF (Pacemaker) ×3 IMPLANT
PAD DEFIB LIFELINK (PAD) ×3 IMPLANT
SHEATH CLASSIC 8F (SHEATH) ×6 IMPLANT
TRAY PACEMAKER INSERTION (PACKS) ×3 IMPLANT

## 2016-11-06 NOTE — ED Triage Notes (Signed)
Pt reports palpitations and sob x 1 week, has holter monitor in place since Tuesday for syncopal episode. Pt denies cp, a/ox4.

## 2016-11-06 NOTE — Progress Notes (Signed)
ANTICOAGULATION CONSULT NOTE - Initial Consult  Pharmacy Consult for Heparin Indication: atrial fibrillation  Allergies  Allergen Reactions  . Morphine And Related Anaphylaxis and Nausea Only    Per family violently ill  . Oxycodone     Dizziness   . Penicillins     joint edema Has patient had a PCN reaction causing immediate rash, facial/tongue/throat swelling, SOB or lightheadedness with hypotension: Unknown Has patient had a PCN reaction causing severe rash involving mucus membranes or skin necrosis: unknown Has patient had a PCN reaction that required hospitalization No Has patient had a PCN reaction occurring within the last 10 years: No If all of the above answers are "NO", then may proceed with Cephalosporin use.   . Colchicine     REACTION: hair loss  . Hydroxychloroquine Other (See Comments)    unknown  . Norvasc [Amlodipine Besylate]   . Ofloxacin Other (See Comments)    unknown  . Streptomycin Other (See Comments)    Unknown  . Tramadol Itching  . Alendronate Sodium Palpitations    Patient Measurements: Height: 5\' 4"  (162.6 cm) Weight: 170 lb (77.1 kg) IBW/kg (Calculated) : 54.7 Heparin Dosing Weight: 71 kg  Vital Signs: Temp: 98.6 F (37 C) (04/27 1214) BP: 191/129 (04/27 1214) Pulse Rate: 127 (04/27 1214)  Labs:  Recent Labs  11/06/16 1226  HGB 12.1  HCT 35.0*  PLT 68*  CREATININE 1.22*    Estimated Creatinine Clearance: 29.6 mL/min (A) (by C-G formula based on SCr of 1.22 mg/dL (H)).   Medical History: Past Medical History:  Diagnosis Date  . AV block, 1st degree   . Bradycardia   . CAD (coronary artery disease)    Non ST elevation 2005 ; LHC in 6/05 in setting of NSTEMI: ant apical AK and inf-apical AK, EF 29%, dOM2 70-80% (small); findings c/w Tako-Tsubo CM  . Cardiomyopathy, nonischemic ([place]) 04/2008   Likely Tako-Tsubo. Resolved. --dx'd on cath 2005. EF 29% with minimal distal CAD (small OM1 70-80).  Echo 10/09 EF 55%;  echo 01/26/12:  EF 55%, mild LAE, PASP 34.  . Colitis, ischemic ([place])   . Complication of anesthesia    post anesthesia excessive somnulence  . Compression fracture of C-spine ([place]) 10/10/2011   Fell at home, tx at [facility]  . Diarrhea associated with pseudomembranous colitis 6/11-17/2013   [facility]  . Dyslipidemia   . Fall    with non-healing rib fractures  . Glaucoma   . Granuloma annulare   . History of phlebitis   . Hypertension    SEVERE  . Idiopathic thrombocytopenic purpura (ITP)   . Lower extremity edema   . Lumbar stenosis 2004   DR. MARK ROY  . Multiple pelvic fractures 05/2012   Dr Adaline Sill , [facility]  . Osteoporosis     Medications:   (Not in a hospital admission) Scheduled:  Infusions:   Assessment: 83yo female presents with palpitations and SOB for 1 week. Pharmacy is consulted to dose heparin for atrial fibrillation.   Goal of Therapy:  Heparin level 0.3-0.7 units/ml Monitor platelets by anticoagulation protocol: Yes   Plan:  Give 4000 units bolus x 1 Start heparin infusion at 1000 units/hr Check anti-Xa level in 8 hours and daily while on heparin Continue to monitor H&H and platelets  Andrey Cota. Diona Foley, PharmD, [place] Pharmacist [phone] 11/06/2016,2:15 PM

## 2016-11-06 NOTE — ED Notes (Signed)
Cards at bedside

## 2016-11-06 NOTE — ED Notes (Signed)
ED Provider at bedside. 

## 2016-11-06 NOTE — Discharge Summary (Addendum)
ELECTROPHYSIOLOGY PROCEDURE DISCHARGE SUMMARY    Patient ID: [name],  MRN: [id number], DOB/AGE: 1923/04/18 83 y.o.  Admit date: 11/06/2016 Discharge date: 11/07/2016  Primary Care Physician: Kathlene November, MD Primary Cardiologist: [place] Electrophysiologist: Lovena Le  Primary Discharge Diagnosis:  Symptomatic tachy/brady syndrome status post pacemaker implantation this admission  Secondary Discharge Diagnosis:  1.  Persistent  atrial fibrillation 2.  HTN 3.  Chronic diastolic heart failure   Allergies  Allergen Reactions  . Morphine And Related Anaphylaxis and Nausea Only    Per family violently ill  . Oxycodone Other (See Comments)    Dizziness   . Penicillins     Joint edema Has patient had a PCN reaction causing immediate rash, facial/tongue/throat swelling, SOB or lightheadedness with hypotension: Yes Has patient had a PCN reaction causing severe rash involving mucus membranes or skin necrosis: No Has patient had a PCN reaction that required hospitalization: No Has patient had a PCN reaction occurring within the last 10 years: No If all of the above answers are "NO", then may proceed with Cephalosporin use.   . Colchicine Other (See Comments)    Hair loss  . Hydroxychloroquine Other (See Comments)    Unknown reaction  . Norvasc [Amlodipine Besylate]     Reaction not recalled by the patient  . Ofloxacin Other (See Comments)    Unknown   . Streptomycin Other (See Comments)    Unknown  . Tramadol Itching  . Alendronate Sodium Palpitations     Procedures This Admission:  1.  Implantation of a STJ dual chamber PPM on 11/06/16 by Dr Lovena Le. See op note for full details. There were no immediate post procedure complications. 2.  CXR on 11/07/16 demonstrated no pneumothorax status post device implantation.   Brief HPI/Hospital Course  [name] is a 83 y.o. female is referred by ER for evaluation of tachycardia.  Past medical history is  significant for CAD, diastolic heart failure, paroxsymal atrial fibrillation, and recent syncope.  She was evaluated by the AHF clinic and was placed on an event monitor.  This demonstrated a 6.5 second pause and her Coreg was discontinued.  She then presented to the ER on the day of admission with palpitations, weakness, and found to be in AF with RVR.  The patient has had symptomatic tachy brady syndrome.  Risks, benefits, and alternatives to PPM implantation were reviewed with the patient who wished to proceed. The patient underwent implantation of a STJ dual chamber PPM with details as outlined above.  She was monitored on telemetry overnight which demonstrated afib with controlled ventricular response.  Left chest was without hematoma or ecchymosis.  The device was interrogated and found to be functioning normally.  CXR was obtained and demonstrated no pneumothorax status post device implantation.  Wound care, arm mobility, and restrictions were reviewed with the patient.  The patient was examined and considered stable for discharge to home.   CHA2DS2/VAS Stroke Risk Points   4   Today, I discussed Coumadin as well as novel anticoagulants including Pradaxa, Xarelto, and Eliquis today as indicated for risk reduction in stroke and systemic emboli with nonvalvular atrial fibrillation.  Risks, benefits, and alternatives to each of these drugs were discussed at length today.  She would prefer xarelto.  CrCl is 39.  Will need close outpatient follow-up by Dr Lovena Le, particularly given low platelets. Will make an appointment in the anticoagulation clinic at [facility] in 4 weeks for follow-up of newly initiated anticoagulation.  Physical Exam: Vitals:   11/07/16 0108 11/07/16 0535 11/07/16 0800 11/07/16 1152  BP: 128/73 124/68 131/76 (!) 154/83  Pulse: 82 70 77 (!) 101  Resp: 18 18 18 18   Temp: 97.5 F (36.4 C) 97.3 F (36.3 C)  97.8 F (36.6 C)  TempSrc: Oral Oral  Oral  SpO2: 96% 97% 96%  99%  Weight:  177 lb 11.2 oz (80.6 kg)    Height:        GEN- The patient is elderly and frail appearing, alert and oriented x 3 today.   HEENT: normocephalic, atraumatic; sclera clear, conjunctiva pink; hearing intact; oropharynx clear; neck supple, no JVP Lungs- Clear to ausculation bilaterally, normal work of breathing.  No wheezes, rales, rhonchi Heart- irrgular rate and rhythm  GI- soft, non-tender, non-distended, bowel sounds present, no hepatosplenomegaly Extremities- no clubbing, cyanosis, or edema; DP/PT/radial pulses 2+ bilaterally MS- no significant deformity or atrophy Skin- warm and dry, no rash or lesion, left chest without hematoma/ecchymosis Psych- euthymic mood, full affect Neuro- strength and sensation are intact   Labs:   Lab Results  Component Value Date   WBC 6.1 11/07/2016   HGB 10.1 (L) 11/07/2016   HCT 30.1 (L) 11/07/2016   MCV 92.3 11/07/2016   PLT 57 (L) 11/07/2016     Recent Labs Lab 11/06/16 1226  NA 141  K 3.8  CL 110  CO2 24  BUN 22*  CREATININE 1.22*  CALCIUM 9.3  GLUCOSE 112*    Discharge Medications:  Allergies as of 11/07/2016      Reactions   Morphine And Related Anaphylaxis, Nausea Only   Per family violently ill   Oxycodone Other (See Comments)   Dizziness    Penicillins    Joint edema Has patient had a PCN reaction causing immediate rash, facial/tongue/throat swelling, SOB or lightheadedness with hypotension: Yes Has patient had a PCN reaction causing severe rash involving mucus membranes or skin necrosis: No Has patient had a PCN reaction that required hospitalization: No Has patient had a PCN reaction occurring within the last 10 years: No If all of the above answers are "NO", then may proceed with Cephalosporin use.   Colchicine Other (See Comments)   Hair loss   Hydroxychloroquine Other (See Comments)   Unknown reaction   Norvasc [amlodipine Besylate]    Reaction not recalled by the patient   Ofloxacin Other (See  Comments)   Unknown   Streptomycin Other (See Comments)   Unknown   Tramadol Itching   Alendronate Sodium Palpitations      Medication List    TAKE these medications   benazepril 20 MG tablet Commonly known as:  LOTENSIN Take 1 tablet (20 mg total) by mouth daily.   beta carotene w/minerals tablet Take 1 tablet by mouth daily.   brimonidine 0.2 % ophthalmic solution Commonly known as:  ALPHAGAN Place 1 drop into the right eye 2 (two) times daily.   CALCIUM 1200 PO Take 1,200 mg by mouth daily.   denosumab 60 MG/ML Soln injection Commonly known as:  PROLIA Inject 60 mg into the skin every 6 (six) months. Administer in upper arm, thigh, or abdomen   diphenhydrAMINE 25 MG tablet Commonly known as:  BENADRYL Take 25 mg by mouth at bedtime as needed for sleep (and sinus issues).   docusate sodium 100 MG capsule Commonly known as:  COLACE Take 100 mg by mouth at bedtime. Stool softener   furosemide 40 MG tablet Commonly known as:  LASIX Take 1 tablet (  40 mg total) by mouth daily.   ibuprofen 200 MG tablet Commonly known as:  ADVIL,MOTRIN Take 200 mg by mouth daily as needed (for pain).   omeprazole 20 MG capsule Commonly known as:  PRILOSEC Take 20 mg by mouth daily.   OSTEO BI-FLEX ADV JOINT SHIELD Tabs Take 1 tablet by mouth daily.   REFRESH TEARS 0.5 % Soln Generic drug:  carboxymethylcellulose Place 1 drop into both eyes 2 (two) times daily.   Rivaroxaban 15 MG Tabs tablet Commonly known as:  XARELTO Take 1 tablet (15 mg total) by mouth daily with supper.   sodium chloride 0.65 % Soln nasal spray Commonly known as:  OCEAN Place 1-2 sprays into both nostrils every 4 (four) hours as needed for congestion.   timolol 0.25 % ophthalmic solution Commonly known as:  TIMOPTIC Place 1 drop into the right eye 2 (two) times daily.   vitamin B-12 1000 MCG tablet Commonly known as:  CYANOCOBALAMIN Take 2,000 mcg by mouth daily.   Vitamin D3 3000 units  Tabs Take 1 tablet by mouth daily.       Disposition:   Follow-up Information    [place] [facility] Follow up on 11/25/2016.   Specialty:  Cardiology Why:  at 3:30PM for wound check  Contact information: [address], [place]       Cristopher Peru, MD Follow up on 02/09/2017.   Specialty:  Cardiology Why:  at [place] information: [place] [address] [place] [id number] [phone]           Duration of Discharge Encounter: Greater than 30 minutes including physician time.  Army Fossa MD 11/07/2016 12:10 PM

## 2016-11-06 NOTE — Telephone Encounter (Signed)
Pt called reporting she was feeling weak had chest discomfort and bp 180/113. Pt also reports falling off of bed this morning.  Advised pt per last phone note (copied below)  to report to the emergency room due to active chest discomfort.  Pt did not want to take an ambulance and would have someone bring her in private vehicle.   "Patient called to report she was notified by on call provider from event monitor company [employer] stating to call our office firs thing this morning for event on heart monitor. Received fax report showing 6.5 second pause.  Patient states she is symptomatic with this and does not feel well. Advised per Dr. Haroldine Laws to STOP Coreg and call our office if she feels no better or worsens, or to call 911 afterhours. Will schedule her to f/u Monday with Oda Kilts after stopping the Coreg per her request. Patient aware and agreeable to plan as stated above."  Leory Plowman, Guinevere Ferrari, RN

## 2016-11-06 NOTE — Progress Notes (Signed)
Pt oriented to room and call light. Pt dressing clean dry and intact. Pt reports no pain. Pt set up on telemetry and connected to frequent vital signs.   [name]

## 2016-11-06 NOTE — H&P (Signed)
ELECTROPHYSIOLOGY CONSULT NOTE    Patient ID: [name] MRN: [id number], DOB/AGE: [birth year] 83 y.o.  Admit date: 11/06/2016 Date of Consult: 11/06/2016  Primary Physician: Kathlene November, MD Primary Cardiologist: Bensimhon  CC: syncope and palpitations  HPI:  [name] is a 83 y.o. female is referred by ER for evaluation of tachycardia.  Past medical history is significant for CAD, diastolic heart failure, paroxsymal atrial fibrillation, and recent syncope.  She was evaluated by the AHF clinic and was placed on an event monitor.  This demonstrated a 6.5 second pause and her Coreg was discontinued.  She then presented to the ER today with palpitations, weakness, and found to be in AF with RVR.  EP has been asked to evaluate for treatment options.  She denies chest pain, shortness of breath (above baseline), recent fevers or chills.   Past Medical History:  Diagnosis Date  . AV block, 1st degree   . Bradycardia   . CAD (coronary artery disease)    Non ST elevation 2005 ; LHC in 6/05 in setting of NSTEMI: ant apical AK and inf-apical AK, EF 29%, dOM2 70-80% (small); findings c/w Tako-Tsubo CM  . Cardiomyopathy, nonischemic ([place]) 04/2008   Likely Tako-Tsubo. Resolved. --dx'd on cath 2005. EF 29% with minimal distal CAD (small OM1 70-80).  Echo 10/09 EF 55%;  echo 01/26/12: EF 55%, mild LAE, PASP 34.  . Colitis, ischemic ([place])   . Complication of anesthesia    post anesthesia excessive somnulence  . Compression fracture of C-spine ([place]) 10/10/2011   Fell at home, tx at [facility]  . Diarrhea associated with pseudomembranous colitis 6/11-17/2013   [facility]  . Dyslipidemia   . Fall    with non-healing rib fractures  . Glaucoma   . Granuloma annulare   . History of phlebitis   . Hypertension    SEVERE  . Idiopathic thrombocytopenic purpura (ITP)   . Lower extremity edema   . Lumbar stenosis 2004   DR. MARK ROY  . Multiple pelvic fractures 05/2012   Dr Adaline Sill ,  [facility]  . Osteoporosis      Surgical History:  Past Surgical History:  Procedure Laterality Date  . ABDOMINAL HYSTERECTOMY    . APPENDECTOMY    . BACK SURGERY  10/2004   LS Disc 4-5 no sx.  Marland Kitchen CARDIAC CATHETERIZATION     12/2003  . CATARACT EXTRACTION, BILATERAL    . COLONOSCOPY  09/2005   adenoma  . COLONOSCOPY W/ POLYPECTOMY    . 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 rectal bleed  05-01-06   colonoscopy-ischemic colitis  . ROTATOR CUFF REPAIR    . VERTEBROPLASTY     Dr Gladstone Lighter      (Not in a hospital admission)  Inpatient Medications: . heparin  4,000 Units Intravenous Once    Allergies:  Allergies  Allergen Reactions  . Morphine And Related Anaphylaxis and Nausea Only    Per family violently ill  . Oxycodone     Dizziness   . Penicillins     joint edema Has patient had a PCN reaction causing immediate rash, facial/tongue/throat swelling, SOB or lightheadedness with hypotension: Unknown Has patient had a PCN reaction causing severe rash involving mucus membranes or skin necrosis: unknown Has patient had a PCN reaction that required hospitalization No Has patient had a PCN reaction occurring within the last 10 years: No If all of the above answers are "NO", then may proceed with Cephalosporin use.   Marland Kitchen  Colchicine     REACTION: hair loss  . Hydroxychloroquine Other (See Comments)    unknown  . Norvasc [Amlodipine Besylate]   . Ofloxacin Other (See Comments)    unknown  . Streptomycin Other (See Comments)    Unknown  . Tramadol Itching  . Alendronate Sodium Palpitations    Social History   Social History  . Marital status: Widowed    Spouse name: N/A  . Number of children: 1  . Years of education: N/A   Occupational History  . retired Retired   Social History Main Topics  . Smoking status: Never Smoker  . Smokeless tobacco: Never Used  . Alcohol use No  . Drug use: No  . Sexual activity: Not on file   Other Topics Concern  . Not on file    Social History Narrative   Widowed   Lives by herself in a town house, drives    Son [name]         Family History  Problem Relation Age of Onset  . Coronary artery disease Mother   . Heart failure Mother   . Renal cancer Mother   . Kidney disease Mother   . Stroke Father     in his 31s  . Colon cancer Father   . Breast cancer Sister      X 2  . Rectal cancer Sister   . Breast cancer Sister   . Diabetes Neg Hx      Review of Systems: All other systems reviewed and are otherwise negative except as noted above.  Physical Exam: Vitals:   11/06/16 1214 11/06/16 1415  BP: (!) 191/129 (!) 168/127  Pulse: (!) 127 (!) 121  Resp: 18 19  Temp: 98.6 F (37 C)   SpO2: 99% 97%  Weight: 170 lb (77.1 kg)   Height: 5\' 4"  (1.626 m)     GEN- The patient is elderly appearing, alert and oriented x 3 today.   HEENT: normocephalic, atraumatic; sclera clear, conjunctiva pink; hearing intact; oropharynx clear; neck supple Lungs- Clear to ausculation bilaterally, normal work of breathing.  No wheezes, rales, rhonchi Heart- Tachycardic irregular rate and rhythm  GI- soft, non-tender, non-distended, bowel sounds present  Extremities- no clubbing, cyanosis, or edema; DP/PT/radial pulses 2+ bilaterally MS- no significant deformity or atrophy Skin- warm and dry, no rash or lesion Psych- euthymic mood, full affect Neuro- strength and sensation are intact  Labs:  Lab Results  Component Value Date   WBC 5.4 11/06/2016   HGB 12.1 11/06/2016   HCT 35.0 (L) 11/06/2016   MCV 92.8 11/06/2016   PLT 68 (L) 11/06/2016    Recent Labs Lab 11/06/16 1226  NA 141  K 3.8  CL 110  CO2 24  BUN 22*  CREATININE 1.22*  CALCIUM 9.3  GLUCOSE 112*      Radiology/Studies: Dg Chest 2 View  Result Date: 11/06/2016 CLINICAL DATA:  Palpitations, shortness of breath EXAM: CHEST  2 VIEW COMPARISON:  01/06/2016 FINDINGS: The lungs are hyperinflated likely secondary to COPD. There is bilateral  chronic interstitial thickening. There is no focal parenchymal opacity. There is no pleural effusion or pneumothorax. There is stable cardiomegaly. There is a small hiatal hernia. The osseous structures are unremarkable. IMPRESSION: No active cardiopulmonary disease. Electronically Signed   By: Kathreen Devoid   On: 11/06/2016 12:51    OFB:PZWCHE fibrillation (personally reviewed)  TELEMETRY: AF with RVR (personally reviewed)  Assessment/Plan: 1.  Tachy/brady syndrome The patient has tachy brady syndrome  with syncope.  She has an indication for pacemaker implant. Risks, benefits reviewed with the patient who wishes to proceed. Will plan for later today  2.  Paroxysmal atrial fibrillation CHADS2VASC is at least 4 She will need consideration for long term [place]  3.  Chronic diastolic heart failure Euvolemic on exam Continue current therapy Resume Coreg after pacemaker  4.  HTN BP elevated today Will plan to resume Coreg     Signed, Chanetta Marshall, NP 11/06/2016 2:58 PM  EP Attending  Patient seen and examined. Agree with above. The patient presents with syncope due to intermittent CHB with concomittant atrial fib with a RVR. She has documented pauses of over 6 seconds and atrial fib with a RVR and a HR of over 140/min. I have reviewed the treatment options with the patient and she wishes to proceed with PPM insertion. The risks/benefits/goals/expectations of the procedure have been reviewed and she wishes to proceed.  Mikle Bosworth.D.

## 2016-11-07 ENCOUNTER — Ambulatory Visit (HOSPITAL_COMMUNITY): Payer: Medicare Other

## 2016-11-07 DIAGNOSIS — I2510 Atherosclerotic heart disease of native coronary artery without angina pectoris: Secondary | ICD-10-CM | POA: Diagnosis not present

## 2016-11-07 DIAGNOSIS — I5032 Chronic diastolic (congestive) heart failure: Secondary | ICD-10-CM | POA: Diagnosis not present

## 2016-11-07 DIAGNOSIS — I130 Hypertensive heart and chronic kidney disease with heart failure and stage 1 through stage 4 chronic kidney disease, or unspecified chronic kidney disease: Secondary | ICD-10-CM | POA: Diagnosis not present

## 2016-11-07 DIAGNOSIS — Z880 Allergy status to penicillin: Secondary | ICD-10-CM | POA: Diagnosis not present

## 2016-11-07 DIAGNOSIS — N183 Chronic kidney disease, stage 3 (moderate): Secondary | ICD-10-CM | POA: Diagnosis not present

## 2016-11-07 DIAGNOSIS — I428 Other cardiomyopathies: Secondary | ICD-10-CM | POA: Diagnosis not present

## 2016-11-07 DIAGNOSIS — I495 Sick sinus syndrome: Secondary | ICD-10-CM | POA: Diagnosis not present

## 2016-11-07 DIAGNOSIS — R0602 Shortness of breath: Secondary | ICD-10-CM | POA: Diagnosis not present

## 2016-11-07 DIAGNOSIS — I481 Persistent atrial fibrillation: Secondary | ICD-10-CM | POA: Diagnosis not present

## 2016-11-07 DIAGNOSIS — I442 Atrioventricular block, complete: Secondary | ICD-10-CM

## 2016-11-07 DIAGNOSIS — H409 Unspecified glaucoma: Secondary | ICD-10-CM | POA: Diagnosis not present

## 2016-11-07 DIAGNOSIS — I252 Old myocardial infarction: Secondary | ICD-10-CM | POA: Diagnosis not present

## 2016-11-07 DIAGNOSIS — E785 Hyperlipidemia, unspecified: Secondary | ICD-10-CM | POA: Diagnosis not present

## 2016-11-07 LAB — CBC
HEMATOCRIT: 30.1 % — AB (ref 36.0–46.0)
Hemoglobin: 10.1 g/dL — ABNORMAL LOW (ref 12.0–15.0)
MCH: 31 pg (ref 26.0–34.0)
MCHC: 33.6 g/dL (ref 30.0–36.0)
MCV: 92.3 fL (ref 78.0–100.0)
Platelets: 57 10*3/uL — ABNORMAL LOW (ref 150–400)
RBC: 3.26 MIL/uL — ABNORMAL LOW (ref 3.87–5.11)
RDW: 14 % (ref 11.5–15.5)
WBC: 6.1 10*3/uL (ref 4.0–10.5)

## 2016-11-07 MED ORDER — RIVAROXABAN 20 MG PO TABS: 20.0000 mg | ORAL_TABLET | Freq: Every day | ORAL | 4 refills | Status: DC

## 2016-11-07 MED ORDER — RIVAROXABAN 15 MG PO TABS: 15.0000 mg | ORAL_TABLET | Freq: Every day | ORAL | 4 refills | Status: DC

## 2016-11-07 MED ORDER — RIVAROXABAN 20 MG PO TABS: 20.0000 mg | ORAL_TABLET | Freq: Every day | ORAL | Status: DC

## 2016-11-07 MED ORDER — RIVAROXABAN 15 MG PO TABS: 15.0000 mg | ORAL_TABLET | Freq: Every day | ORAL | Status: DC

## 2016-11-07 NOTE — Progress Notes (Signed)
Pt IV discontinued, catheter intact and telemetry removed. Pt has all belongings including discharge paperwork, xarelto card, pacemaker box and home halter monitor. Discharge education went over with pt and son. Pt discharged via wheelchair with nurse tech  Harlo Jaso Inola

## 2016-11-07 NOTE — Progress Notes (Signed)
Pt had questions regarding insurance and pay. Debbie the case manager came to bedside to answer questions and explain xarelto card.   [name] [name]

## 2016-11-07 NOTE — Discharge Instructions (Signed)
Information on my medicine - XARELTO (Rivaroxaban)  This medication education was reviewed with me or my healthcare representative as part of my discharge preparation.  The pharmacist that spoke with me during my hospital stay was:  Melburn Popper, RPH  Why was Xarelto prescribed for you? Xarelto was prescribed for you to reduce the risk of a blood clot forming that can cause a stroke if you have a medical condition called atrial fibrillation (a type of irregular heartbeat).  What do you need to know about xarelto ? Take your Xarelto ONCE DAILY at the same time every day with your evening meal. If you have difficulty swallowing the tablet whole, you may crush it and mix in applesauce just prior to taking your dose.  Take Xarelto exactly as prescribed by your doctor and DO NOT stop taking Xarelto without talking to the doctor who prescribed the medication.  Stopping without other stroke prevention medication to take the place of Xarelto may increase your risk of developing a clot that causes a stroke.  Refill your prescription before you run out.  After discharge, you should have regular check-up appointments with your healthcare provider that is prescribing your Xarelto.  In the future your dose may need to be changed if your kidney function or weight changes by a significant amount.  What do you do if you miss a dose? If you are taking Xarelto ONCE DAILY and you miss a dose, take it as soon as you remember on the same day then continue your regularly scheduled once daily regimen the next day. Do not take two doses of Xarelto at the same time or on the same day.   Important Safety Information A possible side effect of Xarelto is bleeding. You should call your healthcare provider right away if you experience any of the following: ? Bleeding from an injury or your nose that does not stop. ? Unusual colored urine (red or dark brown) or unusual colored stools (red or black). ? Unusual  bruising for unknown reasons. ? A serious fall or if you hit your head (even if there is no bleeding).  Some medicines may interact with Xarelto and might increase your risk of bleeding while on Xarelto. To help avoid this, consult your healthcare provider or pharmacist prior to using any new prescription or non-prescription medications, including herbals, vitamins, non-steroidal anti-inflammatory drugs (NSAIDs) and supplements.  This website has more information on Xarelto: [email or web address]-benson.[email or web address]    Supplemental Discharge Instructions for  Pacemaker/Defibrillator Patients  Activity No heavy lifting or vigorous activity with your left/right arm for 6 to 8 weeks.  Do not raise your left/right arm above your head for one week.  Gradually raise your affected arm as drawn below.           __  11/10/16                         11/11/16                       11/12/16                     11/13/16   WOUND CARE - Keep the wound area clean and dry.  Do not get this area wet for one week. No showers for one week; you may shower on  11/13/16   . - The tape/steri-strips on your wound will fall off; do not pull them off.  No bandage is needed on the site.  DO  NOT apply any creams, oils, or ointments to the wound area. - If you notice any drainage or discharge from the wound, any swelling or bruising at the site, or you develop a fever > 101? F after you are discharged home, call the office at once.  Special Instructions - You are still able to use cellular telephones; use the ear opposite the side where you have your pacemaker/defibrillator.  Avoid carrying your cellular phone near your device. - When traveling through airports, show security personnel your identification card to avoid being screened in the metal detectors.  Ask the security personnel to use the hand wand. - Avoid arc welding equipment, MRI testing (magnetic resonance imaging), TENS units (transcutaneous nerve stimulators).  Call the office for  questions about other devices. - Avoid electrical appliances that are in poor condition or are not properly grounded. - Microwave ovens are safe to be near or to operate.

## 2016-11-07 NOTE — Progress Notes (Signed)
Pt. Is alert and oriented going down for Xray, Placing on High Low Bed, for FX and Bleeding Risk D/t Platelets being low

## 2016-11-07 NOTE — Care Management Note (Signed)
Case Management Note  Patient Details  Name: [name] MRN: [id number] Date of Birth: 07-16-1922  Subjective/Objective:                 Patient with order to DC to home, provided with 30 day Xaralto card, explained to patient and son at the bedside. No other CM needs identified.   Action/Plan:  DC to home Expected Discharge Date:  11/07/16               Expected Discharge Plan:  Home/Self Care  In-House Referral:     Discharge planning Services  CM Consult, Medication Assistance  Post Acute Care Choice:    Choice offered to:     DME Arranged:    DME Agency:     HH Arranged:    HH Agency:     Status of Service:  Completed, signed off  If discussed at [employer] of Stay Meetings, dates discussed:    Additional Comments:  Carles Collet, RN 11/07/2016, 1:34 PM

## 2016-11-07 NOTE — Progress Notes (Addendum)
Pt states MD removed dressing. Pt states her arm hurts like her skin was removed with the dressing. Skin is redden, lotion applied to back of left shoulder away from incision site

## 2016-11-07 NOTE — Progress Notes (Signed)
Pt is alert and oriented with no distress, R/A,  Gave Prn for Discomfort, Vital Stable. IV Antibiotics continued, Plan for EKG and CXR in the AM

## 2016-11-07 NOTE — ED Provider Notes (Signed)
[facility] DEPT Provider Note   CSN: [id number] Arrival date & time: 11/06/16  1210     History   Chief Complaint Chief Complaint  Patient presents with  . Palpitations  . Shortness of Breath    HPI [name] is a 84 y.o. female.  HPI 83 y.o.female come in to the ER with cc of DIB and palpitations.Past medical history is significant for CAD, diastolic heart failure, recent syncope. Pt has been having dib and dizziness for the last several days, so she was placed on an event monitor. The event monitored uncovered a 6.5 second pause, so it seems that pt was asked to stop her coreg. Pt reports that she has had persistent near black out events.  Pt is noted to be in AF. She has not been diagnosed with AF in the past the patient has CHF, but denies ant chest pain, dib. Pt has no hx of PE.    Past Medical History:  Diagnosis Date  . AV block, 1st degree   . Bradycardia   . CAD (coronary artery disease)    Non ST elevation 2005 ; LHC in 6/05 in setting of NSTEMI: ant apical AK and inf-apical AK, EF 29%, dOM2 70-80% (small); findings c/w Tako-Tsubo CM  . Cardiomyopathy, nonischemic ([place]) 04/2008   Likely Tako-Tsubo. Resolved. --dx'd on cath 2005. EF 29% with minimal distal CAD (small OM1 70-80).  Echo 10/09 EF 55%;  echo 01/26/12: EF 55%, mild LAE, PASP 34.  . Colitis, ischemic ([place])   . Complication of anesthesia    post anesthesia excessive somnulence  . Compression fracture of C-spine ([place]) 10/10/2011   Fell at home, tx at [facility]  . Diarrhea associated with pseudomembranous colitis 6/11-17/2013   [facility]  . Dyslipidemia   . Fall    with non-healing rib fractures  . Glaucoma   . Granuloma annulare   . History of phlebitis   . Hypertension    SEVERE  . Idiopathic thrombocytopenic purpura (ITP)   . Lower extremity edema   . Lumbar stenosis 2004   DR. MARK ROY  . Multiple pelvic fractures 05/2012   Dr Adaline Sill , [facility]  . Osteoporosis     Patient  Active Problem List   Diagnosis Date Noted  . Complete heart block ([place]) 11/06/2016  . Palpitations 10/27/2016  . Coronary artery disease 04/29/2016  . Indigestion 09/25/2015  . Pyelonephritis 09/23/2015  . PCP NOTES >>>>>>>>>>>>>>>>>> 04/09/2015  . Sinus tachycardia 03/08/2014  . *CKD (chronic kidney disease) stage 3, GFR 30-59 ml/min 08/14/2013  . Ectopic atrial rhythm 08/14/2013  . *Diastolic CHF, chronic 01/77/9390  . Fractures involving multiple body regions 12/22/2011  . Idiopathic thrombocytopenic purpura ([place]) 09/25/2011  . Edema   . SYNCOPE 07/19/2010  . ARTHRALGIA 11/14/2009  . CARDIOMYOPATHY, PRIMARY, DILATED 10/07/2009  . URINARY INCONTINENCE 03/11/2009  . *ANEMIA, PERNICIOUS 05/22/2008  . Takotsubo syndrome 05/22/2008  . SUPRAVENTRICULAR TACHYCARDIA, PAROXYSMAL, HX OF 05/22/2008  . POPLITEAL CYST 11/07/2007  . *Essential hypertension 09/19/2007  . PHLEBITIS 09/19/2007  . ISCHEMIC COLITIS, HX OF 09/19/2007  . 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 *Osteoporosis 08/19/2006  . COLONIC POLYPS, HX OF 08/19/2006    Past Surgical History:  Procedure Laterality Date  . ABDOMINAL HYSTERECTOMY    . APPENDECTOMY    . BACK SURGERY  10/2004   LS Disc 4-5 no sx.  Marland Kitchen CARDIAC CATHETERIZATION     12/2003  . CATARACT EXTRACTION, BILATERAL    . COLONOSCOPY  09/2005   adenoma  .  COLONOSCOPY W/ POLYPECTOMY    . 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 rectal bleed  05-01-06   colonoscopy-ischemic colitis  . ROTATOR CUFF REPAIR    . VERTEBROPLASTY     Dr Gladstone Lighter    OB History    No data available       Home Medications    Prior to Admission medications   Medication Sig Start Date End Date Taking? Authorizing Provider  benazepril (LOTENSIN) 20 MG tablet Take 1 tablet (20 mg total) by mouth daily. 06/09/16  Yes Colon Branch, MD  beta carotene w/minerals (OCUVITE) tablet Take 1 tablet by mouth daily.    Yes Historical Provider, MD  brimonidine (ALPHAGAN) 0.2 % ophthalmic  solution Place 1 drop into the right eye 2 (two) times daily.     Yes Historical Provider, MD  Calcium Carbonate-Vit D-Min (CALCIUM 1200 PO) Take 1,200 mg by mouth daily.   Yes Historical Provider, MD  carboxymethylcellulose (REFRESH TEARS) 0.5 % SOLN Place 1 drop into both eyes 2 (two) times daily.    Yes Historical Provider, MD  Cholecalciferol (VITAMIN D3) 3000 UNITS TABS Take 1 tablet by mouth daily.   Yes Historical Provider, MD  denosumab (PROLIA) 60 MG/ML SOLN injection Inject 60 mg into the skin every 6 (six) months. Administer in upper arm, thigh, or abdomen   Yes Historical Provider, MD  diphenhydrAMINE (BENADRYL) 25 MG tablet Take 25 mg by mouth at bedtime as needed for sleep (and sinus issues).    Yes Historical Provider, MD  docusate sodium (COLACE) 100 MG capsule Take 100 mg by mouth at bedtime. Stool softener    Yes Historical Provider, MD  furosemide (LASIX) 40 MG tablet Take 1 tablet (40 mg total) by mouth daily. 06/09/16  Yes Colon Branch, MD  ibuprofen (ADVIL,MOTRIN) 200 MG tablet Take 200 mg by mouth daily as needed (for pain).    Yes Historical Provider, MD  Misc Natural Products (OSTEO BI-FLEX ADV JOINT SHIELD) TABS Take 1 tablet by mouth daily.    Yes Historical Provider, MD  omeprazole (PRILOSEC) 20 MG capsule Take 20 mg by mouth daily.     Yes Historical Provider, MD  sodium chloride (OCEAN) 0.65 % SOLN nasal spray Place 1-2 sprays into both nostrils every 4 (four) hours as needed for congestion.   Yes Historical Provider, MD  timolol (TIMOPTIC) 0.25 % ophthalmic solution Place 1 drop into the right eye 2 (two) times daily.   Yes Historical Provider, MD  vitamin B-12 (CYANOCOBALAMIN) 1000 MCG tablet Take 2,000 mcg by mouth daily.    Yes Historical Provider, MD  rivaroxaban (XARELTO) 15 MG TABS tablet Take 1 tablet (15 mg total) by mouth daily with supper. 11/07/16   Thompson Grayer, MD    Family History Family History  Problem Relation Age of Onset  . Coronary artery disease  Mother   . Heart failure Mother   . Renal cancer Mother   . Kidney disease Mother   . Stroke Father     in his 77s  . Colon cancer Father   . Breast cancer Sister      X 2  . Rectal cancer Sister   . Breast cancer Sister   . Diabetes Neg Hx     Social History Social History  Substance Use Topics  . Smoking status: Never Smoker  . Smokeless tobacco: Never Used  . Alcohol use No     Allergies   Morphine and related; Oxycodone; Penicillins; Colchicine;  Hydroxychloroquine; Norvasc [amlodipine besylate]; Ofloxacin; Streptomycin; Tramadol; and Alendronate sodium   Review of Systems Review of Systems  Cardiovascular: Positive for palpitations.  Neurological: Positive for syncope.  All other systems reviewed and are negative.    Physical Exam Updated Vital Signs BP (!) 154/83 (BP Location: Right Arm)   Pulse (!) 101   Temp 97.8 F (36.6 C) (Oral)   Resp 18   Ht 5\' 4"  (1.626 m)   Wt 177 lb 11.2 oz (80.6 kg)   SpO2 99%   BMI 30.50 kg/m   Physical Exam  Constitutional: She is oriented to person, place, and time. She appears well-developed and well-nourished.  HENT:  Head: Normocephalic and atraumatic.  Eyes: EOM are normal. Pupils are equal, round, and reactive to light.  Neck: Neck supple. No JVD present.  Cardiovascular:  Tachycardia, irregular rhythm  Pulmonary/Chest: Effort normal. No respiratory distress. She has no wheezes.  Abdominal: Soft.  Musculoskeletal: She exhibits no edema or tenderness.  Neurological: She is alert and oriented to person, place, and time. No cranial nerve deficit. Coordination normal.  Skin: Skin is warm and dry.  Nursing note and vitals reviewed.    ED Treatments / Results  Labs (all labs ordered are listed, but only abnormal results are displayed) Labs Reviewed  BASIC METABOLIC PANEL - Abnormal; Notable for the following:       Result Value   Glucose, Bld 112 (*)    BUN 22 (*)    Creatinine, Ser 1.22 (*)    GFR calc non  Af Amer 37 (*)    GFR calc Af Amer 43 (*)    All other components within normal limits  CBC - Abnormal; Notable for the following:    RBC 3.77 (*)    HCT 35.0 (*)    Platelets 68 (*)    All other components within normal limits  CBC - Abnormal; Notable for the following:    RBC 3.26 (*)    Hemoglobin 10.1 (*)    HCT 30.1 (*)    Platelets 57 (*)    All other components within normal limits  MAGNESIUM  TSH  PROTIME-INR  APTT  I-STAT TROPOININ, ED    EKG  EKG Interpretation  Date/Time:  Friday November 06 2016 12:19:11 EDT Ventricular Rate:  118 PR Interval:    QRS Duration: 88 QT Interval:  332 QTC Calculation: 465 R Axis:   -4 Text Interpretation:  Atrial fibrillation with rapid ventricular response Abnormal ECG No acute changes afib is new Confirmed by Kathrynn Humble, MD, Thelma Comp [phone]) on 11/06/2016 2:05:01 PM       Radiology Dg Chest 2 View  Result Date: 11/07/2016 CLINICAL DATA:  Pacemaker, shortness of breath EXAM: CHEST  2 VIEW COMPARISON:  11/06/2016 FINDINGS: The lungs are hyperinflated likely secondary to COPD. There is bilateral chronic interstitial thickening. There is no focal parenchymal opacity. There is no pleural effusion or pneumothorax. There is stable cardiomegaly. There is a dual lead cardiac pacemaker. There is a moderate-sized hiatal hernia. There are old healed right posterior rib fractures. There is evidence of prior T12 vertebral body augmentation. IMPRESSION: No active cardiopulmonary disease. Interval placement of a cardiac pacemaker without a pneumothorax. Electronically Signed   By: Kathreen Devoid   On: 11/07/2016 08:26   Dg Chest 2 View  Result Date: 11/06/2016 CLINICAL DATA:  Palpitations, shortness of breath EXAM: CHEST  2 VIEW COMPARISON:  01/06/2016 FINDINGS: The lungs are hyperinflated likely secondary to COPD. There is bilateral chronic interstitial thickening.  There is no focal parenchymal opacity. There is no pleural effusion or pneumothorax. There  is stable cardiomegaly. There is a small hiatal hernia. The osseous structures are unremarkable. IMPRESSION: No active cardiopulmonary disease. Electronically Signed   By: Kathreen Devoid   On: 11/06/2016 12:51    Procedures Procedures (including critical care time)  CRITICAL CARE Performed by: Varney Biles   Total critical care time: 35 minutes  Critical care time was exclusive of separately billable procedures and treating other patients.  Critical care was necessary to treat or prevent imminent or life-threatening deterioration.  Critical care was time spent personally by me on the following activities: development of treatment plan with patient and/or surrogate as well as nursing, discussions with consultants, evaluation of patient's response to treatment, examination of patient, obtaining history from patient or surrogate, ordering and performing treatments and interventions, ordering and review of laboratory studies, ordering and review of radiographic studies, pulse oximetry and re-evaluation of patient's condition.   Medications Ordered in ED Medications  brimonidine (ALPHAGAN) 0.2 % ophthalmic solution 1 drop (1 drop Right Eye Given 11/07/16 0849)  multivitamin (PROSIGHT) tablet 1 tablet (1 tablet Oral Given 11/07/16 0846)  pantoprazole (PROTONIX) EC tablet 40 mg (40 mg Oral Given 11/07/16 0846)  polyvinyl alcohol (LIQUIFILM TEARS) 1.4 % ophthalmic solution 1 drop (1 drop Both Eyes Given 11/07/16 0848)  docusate sodium (COLACE) capsule 100 mg (100 mg Oral Not Given 11/06/16 2200)  diphenhydrAMINE (BENADRYL) capsule 25 mg (25 mg Oral Given 11/06/16 2221)  vitamin B-12 (CYANOCOBALAMIN) tablet 2,000 mcg (2,000 mcg Oral Given 11/07/16 0846)  cholecalciferol (VITAMIN D) tablet 3,000 Units (3,000 Units Oral Given 11/07/16 0846)  furosemide (LASIX) tablet 40 mg (40 mg Oral Not Given 11/07/16 0847)  benazepril (LOTENSIN) tablet 20 mg (20 mg Oral Given 11/07/16 0846)  ibuprofen (ADVIL,MOTRIN)  tablet 200 mg (200 mg Oral Given 11/06/16 2003)  timolol (TIMOPTIC) 0.25 % ophthalmic solution 1 drop (1 drop Right Eye Given 11/07/16 0848)  sodium chloride (OCEAN) 0.65 % nasal spray 1-2 spray (not administered)  acetaminophen (TYLENOL) tablet 325-650 mg (650 mg Oral Given 11/07/16 0319)  ondansetron (ZOFRAN) injection 4 mg (not administered)  Rivaroxaban (XARELTO) tablet 15 mg (not administered)  sodium chloride 0.9 % bolus 500 mL (0 mLs Intravenous Stopped 11/06/16 1536)  vancomycin (VANCOCIN) IVPB 1000 mg/200 mL premix (0 mg Intravenous Stopped 11/06/16 1702)  polymyxin B 500,000 Units, bacitracin 50,000 Units in sodium chloride irrigation 0.9 % 500 mL irrigation ( Irrigation Given 11/06/16 1744)  vancomycin (VANCOCIN) IVPB 1000 mg/200 mL premix (0 mg Intravenous Stopped 11/07/16 0520)     Initial Impression / Assessment and Plan / ED Course  I have reviewed the triage vital signs and the nursing notes.  Pertinent labs & imaging results that were available during my care of the patient were reviewed by me and considered in my medical decision making (see chart for details).     Pt comes in with cc of near syncope and palpitations. Pt is noted to be in AF, which is new diagnosis. It is quite possible that she has been in paroxysmal AF for few days now. Pt had a holter monitor, which noted a sinus arrest - thus pt's CCB was stopped. Pt is in AF with RVR right now, but due to the sinus arrest, we think it is best that pt not get any rate control for now, since she is stable. Cards have been consulted for possibly tachy-brady dysrhythmia. Anticoagulation ordered.  CHA2DS2/VAS Stroke Risk Points  6 >= 2 Points: High Risk  1 - 1.99 Points: Medium Risk  0 Points: Low Risk    The patient's score has not changed in the past year.:  No Change         Details    Note: External data might be a factor in metrics not marked with    Points Metrics   This score determines the patient's risk of  having a stroke if the  patient has atrial fibrillation.       1 Has Congestive Heart Failure:  Yes   1 Has Vascular Disease:  Yes   1 Has Hypertension:  Yes   2 Age:  52   0 Has Diabetes:  No   0 Had Stroke:  No Had TIA:  No Had thromboembolism:  No   1 Female:  Yes          Final Clinical Impressions(s) / ED Diagnoses   Final diagnoses:  Atrial fibrillation with RVR ([place])  Dizziness    New Prescriptions Discharge Medication List as of 11/07/2016 12:02 PM    START taking these medications   Details  rivaroxaban (XARELTO) 20 MG TABS tablet Take 1 tablet (20 mg total) by mouth daily with supper., Starting Sat 11/07/2016, Normal         Varney Biles, MD 11/07/16 1520

## 2016-11-09 ENCOUNTER — Ambulatory Visit (HOSPITAL_COMMUNITY)
Admission: RE | Admit: 2016-11-09 | Discharge: 2016-11-09 | Disposition: A | Discharge status: Home / Self Care | Payer: Medicare Other | Source: Ambulatory Visit | Attending: Cardiology | Admitting: Cardiology

## 2016-11-09 ENCOUNTER — Encounter (HOSPITAL_COMMUNITY): Payer: Self-pay | Admitting: Internal Medicine

## 2016-11-09 VITALS — BP 104/68 | HR 128 | Wt 176.6 lb

## 2016-11-09 DIAGNOSIS — I10 Essential (primary) hypertension: Secondary | ICD-10-CM | POA: Diagnosis not present

## 2016-11-09 DIAGNOSIS — N183 Chronic kidney disease, stage 3 unspecified: Secondary | ICD-10-CM

## 2016-11-09 DIAGNOSIS — I429 Cardiomyopathy, unspecified: Secondary | ICD-10-CM | POA: Diagnosis not present

## 2016-11-09 DIAGNOSIS — H409 Unspecified glaucoma: Secondary | ICD-10-CM | POA: Insufficient documentation

## 2016-11-09 DIAGNOSIS — I5181 Takotsubo syndrome: Secondary | ICD-10-CM | POA: Insufficient documentation

## 2016-11-09 DIAGNOSIS — Z950 Presence of cardiac pacemaker: Secondary | ICD-10-CM | POA: Diagnosis not present

## 2016-11-09 DIAGNOSIS — Z7901 Long term (current) use of anticoagulants: Secondary | ICD-10-CM | POA: Diagnosis not present

## 2016-11-09 DIAGNOSIS — I2510 Atherosclerotic heart disease of native coronary artery without angina pectoris: Secondary | ICD-10-CM | POA: Insufficient documentation

## 2016-11-09 DIAGNOSIS — M48061 Spinal stenosis, lumbar region without neurogenic claudication: Secondary | ICD-10-CM | POA: Insufficient documentation

## 2016-11-09 DIAGNOSIS — I252 Old myocardial infarction: Secondary | ICD-10-CM | POA: Diagnosis not present

## 2016-11-09 DIAGNOSIS — I491 Atrial premature depolarization: Secondary | ICD-10-CM | POA: Diagnosis not present

## 2016-11-09 DIAGNOSIS — I471 Supraventricular tachycardia: Secondary | ICD-10-CM | POA: Diagnosis not present

## 2016-11-09 DIAGNOSIS — N189 Chronic kidney disease, unspecified: Secondary | ICD-10-CM | POA: Diagnosis not present

## 2016-11-09 DIAGNOSIS — I5032 Chronic diastolic (congestive) heart failure: Secondary | ICD-10-CM

## 2016-11-09 DIAGNOSIS — E785 Hyperlipidemia, unspecified: Secondary | ICD-10-CM | POA: Diagnosis not present

## 2016-11-09 DIAGNOSIS — I130 Hypertensive heart and chronic kidney disease with heart failure and stage 1 through stage 4 chronic kidney disease, or unspecified chronic kidney disease: Secondary | ICD-10-CM | POA: Diagnosis not present

## 2016-11-09 DIAGNOSIS — M810 Age-related osteoporosis without current pathological fracture: Secondary | ICD-10-CM | POA: Diagnosis not present

## 2016-11-09 DIAGNOSIS — I4891 Unspecified atrial fibrillation: Secondary | ICD-10-CM | POA: Diagnosis not present

## 2016-11-09 MED ORDER — CARVEDILOL 6.25 MG PO TABS: 6.2500 mg | ORAL_TABLET | Freq: Two times a day (BID) | ORAL | 3 refills | Status: DC

## 2016-11-09 MED FILL — Sodium Chloride Irrigation Soln 0.9%: Qty: 500 | Status: AC

## 2016-11-09 MED FILL — Midazolam HCl Inj 5 MG/5ML (Base Equivalent): INTRAMUSCULAR | Qty: 5 | Status: AC

## 2016-11-09 MED FILL — Fentanyl Citrate Preservative Free (PF) Inj 100 MCG/2ML: INTRAMUSCULAR | Qty: 2 | Status: AC

## 2016-11-09 MED FILL — Gentamicin Sulfate Inj 40 MG/ML: INTRAMUSCULAR | Qty: 2 | Status: AC

## 2016-11-09 NOTE — Progress Notes (Signed)
Patient ID: [name], female   DOB: [birth year], 83 y.o.   MRN: [id number]     Advanced Heart Failure Clinic Note   PCP: Dr Linna Darner HF: Dr. Haroldine Laws   HPI: [name] is a 83 y.o. 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 LHC in 6/05 in setting of NSTEMI: ant apical AK and inf-apical AK, EF 29%, dOM2 70-80% (small). EF has recovered. Also has a h/o HTN, SVT and fractures pelvis 05/2012. 02/2012  Discharged form [facility] after a fall which resulted in a cervical fracture.   01/2012 ECHO EF 55% 08/14/13 ECHO EF 55%, normal RV size and systolic function.   Admitted 4/27 -11/07/16 for evaluation of tachycardia. Had recent holter monitor placed that showed 6.5 second pause. Coreg stopped and referred to EP. She then developed Afib RVR. With pause, PPM was placed 11/06/16 by Dr. Lovena Le.  Noted to be in Afib with controlled ventricular rate on discharge.   Pt presents today for follow up.  Feeling a little better after PPM placement, but still feeling "fuzzy headed" at times. Denies loss of consciousness or "black vision".  Xarelto is $440 with her insurance so she has been unable to afford.  SOB has much improved s/p pacemaker now with less brady episodes. She has been feeling her heart racing at times. Does get lightheaded when this happens. Taking all medications as directed.    Labs (1/15): K 4.3, creatinine 1.8, BNP 49 Labs (6/17): K 4.5, creatinine 1.14 Labs (9/17): K 4.5, Creatinine 1.38 Labs (10/12/16) LK 4.3, Creatinine 1.17  Review of systems complete and found to be negative unless listed in HPI.    Past Medical History:  Diagnosis Date  . AV block, 1st degree   . Bradycardia   . CAD (coronary artery disease)    Non ST elevation 2005 ; LHC in 6/05 in setting of NSTEMI: ant apical AK and inf-apical AK, EF 29%, dOM2 70-80% (small); findings c/w Tako-Tsubo CM  . Cardiomyopathy, nonischemic ([place]) 04/2008   Likely Tako-Tsubo. Resolved. --dx'd on cath 2005.  EF 29% with minimal distal CAD (small OM1 70-80).  Echo 10/09 EF 55%;  echo 01/26/12: EF 55%, mild LAE, PASP 34.  . Colitis, ischemic ([place])   . Complication of anesthesia    post anesthesia excessive somnulence  . Compression fracture of C-spine ([place]) 10/10/2011   Fell at home, [facility]  . Diarrhea associated with pseudomembranous colitis 6/11-17/2013   [facility]  . Dyslipidemia   . Fall    with non-healing rib fractures  . Glaucoma   . Granuloma annulare   . History of phlebitis   . Hypertension    SEVERE  . Idiopathic thrombocytopenic purpura (ITP)   . Lower extremity edema   . Lumbar stenosis 2004   DR. MARK ROY  . Multiple pelvic fractures 05/2012   Dr Adaline Sill , [facility]  . Osteoporosis     Current Outpatient Prescriptions  Medication Sig Dispense Refill  . benazepril (LOTENSIN) 20 MG tablet Take 1 tablet (20 mg total) by mouth daily. 90 tablet 2  . beta carotene w/minerals (OCUVITE) tablet Take 1 tablet by mouth daily.     . brimonidine (ALPHAGAN) 0.2 % ophthalmic solution Place 1 drop into the right eye 2 (two) times daily.      . Calcium Carbonate-Vit D-Min (CALCIUM 1200 PO) Take 1,200 mg by mouth daily.    . carboxymethylcellulose (REFRESH TEARS) 0.5 % SOLN Place 1 drop into both  eyes 2 (two) times daily.     . Cholecalciferol (VITAMIN D3) 3000 UNITS TABS Take 1 tablet by mouth daily.    Marland Kitchen denosumab (PROLIA) 60 MG/ML SOLN injection Inject 60 mg into the skin every 6 (six) months. Administer in upper arm, thigh, or abdomen    . diphenhydrAMINE (BENADRYL) 25 MG tablet Take 25 mg by mouth at bedtime as needed for sleep (and sinus issues).     . docusate sodium (COLACE) 100 MG capsule Take 100 mg by mouth at bedtime. Stool softener     . furosemide (LASIX) 40 MG tablet Take 1 tablet (40 mg total) by mouth daily. 90 tablet 2  . ibuprofen (ADVIL,MOTRIN) 200 MG tablet Take 200 mg by mouth daily as needed (for pain).     . Misc Natural Products (OSTEO BI-FLEX ADV JOINT  SHIELD) TABS Take 1 tablet by mouth daily.     Marland Kitchen omeprazole (PRILOSEC) 20 MG capsule Take 20 mg by mouth daily.      . rivaroxaban (XARELTO) 15 MG TABS tablet Take 1 tablet (15 mg total) by mouth daily with supper. 30 tablet 4  . sodium chloride (OCEAN) 0.65 % SOLN nasal spray Place 1-2 sprays into both nostrils every 4 (four) hours as needed for congestion.    . timolol (TIMOPTIC) 0.25 % ophthalmic solution Place 1 drop into the right eye 2 (two) times daily.    . vitamin B-12 (CYANOCOBALAMIN) 1000 MCG tablet Take 2,000 mcg by mouth daily.      No current facility-administered medications for this encounter.    PHYSICAL EXAM: Vitals:   11/09/16 1538  BP: 104/68  Pulse: (!) 128  SpO2: 97%  Weight: 176 lb 9.6 oz (80.1 kg)   Wt Readings from Last 3 Encounters:  11/09/16 176 lb 9.6 oz (80.1 kg)  11/07/16 177 lb 11.2 oz (80.6 kg)  10/27/16 178 lb 9.6 oz (81 kg)    General: Elderly and fatigued appearing.  HEENT: Normal Neck: Supple. JVP 6-7 cm. Carotids 2+ bilat; no bruits. No thyromegaly or nodule noted. Cor: PMI nondisplaced. Tachy, irregular.  No M/G/R noted. PPM site stable. Dressing C/D/I. Scattered ecchymosis.  Lungs: CTAB, normal effort. Abdomen: soft, non-tender, distended, no HSM. No bruits or masses. +BS  Extremities: no cyanosis, clubbing, rash, R and LLE no edema.  Neuro: alert & orientedx3, cranial nerves grossly intact. moves all 4 extremities w/o difficulty. Affect pleasant     EKG: Afib RVR 122 (Read as Sinus tach but only intermittent p waves)  ASSESSMENT & PLAN: 1. Chronic diastolic CHF: History of Takotsubo cardiomyopathy, EF has recovered. - Echo 08/14/13 EF 55-60% with normal RV.  - Continue Lasix 40 mg daily - Restart coreg 6.25 mg BID.  - Continue benazepril 20 mg daily.  - Reinforced fluid restriction to < 2 L daily, sodium restriction to less than 2000 mg daily, and the importance of daily weights.     2. CKD:  - BMET stable on discharge.  3. CAD: No  ischemic symptoms.  Nonobstructive CAD on prior cath.   - Denies CP. No change.   4. Afib with RVR/Tachybrady syndrome - EKG with afib/SVT at 122 bpm today.  - Resume coreg 6.25 mg BID. If rate remains high and pressure improves can consider increasing. - Needs to be adequately anticoagulated before we consider DCCV. Could also consider amiodarone.  5. HB with 6.5 second pause s/p [employer] Jude PPM 11/06/16 - Improved since pacemaker placement. EP following.   Keep appt in 2  weeks with EP and 6 weeks with Dr. Haroldine Laws.   Shirley Friar, PA-C  11/09/2016  Greater than 50% of the 25 minute visit was spent in counseling/coordination of care regarding disease state education, medication reconciliation, Discussion with EP provider, and review of plan with MD.

## 2016-11-09 NOTE — Patient Instructions (Signed)
RESTART Carvedilol (Coreg) at 6.25 mg tablets twice daily.  No lab work today.  Follow up with Dr. Haroldine Laws as scheduled.  Do the following things EVERYDAY: 1) Weigh yourself in the morning before breakfast. Write it down and keep it in a log. 2) Take your medicines as prescribed 3) Eat low salt foods-Limit salt (sodium) to 2000 mg per day.  4) Stay as active as you can everyday 5) Limit all fluids for the day to less than 2 liters

## 2016-11-11 ENCOUNTER — Telehealth (HOSPITAL_COMMUNITY): Payer: Self-pay | Admitting: Pharmacist

## 2016-11-11 NOTE — Telephone Encounter (Signed)
[name] called to update me about her Xarelto cost through [facility]. She stated that she was told that a 90 day supply would be $550 but she has a $400 deductible to meet. After that, her copay should be no more than $45/mo. She thinks that she should be able to do this but, if not, she will call me back.   Ruta Hinds. Velva Harman, PharmD, BCPS, CPP Clinical Pharmacist Pager: [phone] Phone: [phone] 11/11/2016 10:43 AM

## 2016-11-25 ENCOUNTER — Encounter (INDEPENDENT_AMBULATORY_CARE_PROVIDER_SITE_OTHER): Payer: Self-pay

## 2016-11-25 ENCOUNTER — Ambulatory Visit (INDEPENDENT_AMBULATORY_CARE_PROVIDER_SITE_OTHER): Payer: Medicare Other | Admitting: *Deleted

## 2016-11-25 VITALS — BP 128/86 | HR 106

## 2016-11-25 DIAGNOSIS — Z950 Presence of cardiac pacemaker: Secondary | ICD-10-CM

## 2016-11-25 DIAGNOSIS — I442 Atrioventricular block, complete: Secondary | ICD-10-CM

## 2016-11-25 LAB — CUP PACEART INCLINIC DEVICE CHECK
Brady Statistic RA Percent Paced: 1.6 %
Brady Statistic RV Percent Paced: 0.38 %
Implantable Lead Implant Date: 20180427
Implantable Lead Location: 753860
Lead Channel Impedance Value: 525 Ohm
Lead Channel Impedance Value: 625 Ohm
Lead Channel Pacing Threshold Amplitude: 0.5 V
Lead Channel Pacing Threshold Pulse Width: 0.5 ms
Lead Channel Pacing Threshold Pulse Width: 0.5 ms
Lead Channel Sensing Intrinsic Amplitude: 12 mV
Lead Channel Sensing Intrinsic Amplitude: 2.5 mV
Lead Channel Setting Pacing Pulse Width: 0.5 ms
Lead Channel Setting Sensing Sensitivity: 2 mV
MDC IDC LEAD IMPLANT DT: 20180427
MDC IDC LEAD LOCATION: 753859
MDC IDC MSMT BATTERY VOLTAGE: 3.07 V
MDC IDC MSMT LEADCHNL RA PACING THRESHOLD AMPLITUDE: 0.75 V
MDC IDC PG IMPLANT DT: 20180427
MDC IDC PG SERIAL: 8900487
MDC IDC SESS DTM: 20180516174730
MDC IDC SET LEADCHNL RA PACING AMPLITUDE: 3.5 V
MDC IDC SET LEADCHNL RV PACING AMPLITUDE: 3.5 V

## 2016-11-25 NOTE — Progress Notes (Signed)
Wound check appointment. Steri-strips removed. Wound without redness or edema. Area of yellow to purple ecchmosis noted under L axilla, improved per patient. Incision edges approximated, wound well healed. Normal device function. Thresholds, sensing, and impedances consistent with implant measurements. Device programmed at 3.5V for extra safety margin until 3 month visit. Histogram distribution R-shifted, carvedilol restarted on 11/09/16. 3 mode switches (5.5%)--AF and 1:1 SVT/cond. AT, no AF since hospital d/c, longest AT 59min, +Xarelto. No high ventricular rates noted. Patient and son educated about wound care, arm mobility, lifting restrictions. ROV with GT on 02/09/17.  Patient reports increasing BLE edema since hospital discharge. She does not weigh daily, but reports her weight is stable. Dyspnea improving each day per patient, denies chest discomfort. BP 128/86, HR 106bpm. Reviewed symptoms with AHF clinic, received verbal order from Thea Gist, PA, to take additional 20mg  furosemide daily x2-3 days, then contact the AHF clinic on Monday for further recommendations. Patient will also plan to resume wearing her compression stockings (previously ordered per patient). Patient and son verbalize understanding of all instructions and deny additional questions at this time.  Patient was also instructed that she may resume driving per Chanetta Marshall, NP.

## 2016-11-25 NOTE — Patient Instructions (Addendum)
Medication Instructions:   Take an additional 20mg  (half tablet) of furosemide (Lasix) once a day for two to three days. This is in addition to your 40mg  Lasix tablet once daily.  Follow-Up:  Call the [facility] on Monday at [phone] to let them know how you're feeling.  If your symptoms worsen prior to Monday, call the [facility] or go to the Emergency Room.   Any Other Special Instructions Will Be Listed Below (If Applicable).  If you have any issues affording your Xarelto, please call Doroteo Bradford (pharmacist) at [phone].

## 2016-11-30 ENCOUNTER — Telehealth (HOSPITAL_COMMUNITY): Payer: Self-pay | Admitting: Pharmacist

## 2016-11-30 NOTE — Telephone Encounter (Signed)
Ms. Totten called wanting to know if there is anything she can do to get the cost of her Xarelto down. She was told by [facility] that after she meets her $400 deductible, her Xarelto will cost $220/3 mo supply which she cannot afford. I have advised her that she may be eligible for [employer] patient assistance as long as she has spent 3% of her annual income on her Rx's for the year (~$530). She will get copies of her pharmacy print outs from [facility] and [facility] and bring them to her appointment with Dr. Haroldine Laws on 6/12.   Ruta Hinds. Velva Harman, PharmD, BCPS, CPP Clinical Pharmacist Pager: [phone] Phone: [phone] 11/30/2016 11:57 AM

## 2016-12-22 ENCOUNTER — Telehealth (HOSPITAL_COMMUNITY): Payer: Self-pay | Admitting: Pharmacist

## 2016-12-22 ENCOUNTER — Ambulatory Visit (HOSPITAL_COMMUNITY)
Admission: RE | Admit: 2016-12-22 | Discharge: 2016-12-22 | Disposition: A | Discharge status: Home / Self Care | Payer: Medicare Other | Source: Ambulatory Visit | Attending: Internal Medicine | Admitting: Internal Medicine

## 2016-12-22 VITALS — BP 180/90 | HR 82 | Wt 176.8 lb

## 2016-12-22 DIAGNOSIS — I429 Cardiomyopathy, unspecified: Secondary | ICD-10-CM | POA: Diagnosis not present

## 2016-12-22 DIAGNOSIS — D510 Vitamin B12 deficiency anemia due to intrinsic factor deficiency: Secondary | ICD-10-CM

## 2016-12-22 DIAGNOSIS — I5032 Chronic diastolic (congestive) heart failure: Secondary | ICD-10-CM | POA: Diagnosis not present

## 2016-12-22 DIAGNOSIS — H409 Unspecified glaucoma: Secondary | ICD-10-CM | POA: Diagnosis not present

## 2016-12-22 DIAGNOSIS — I4891 Unspecified atrial fibrillation: Secondary | ICD-10-CM | POA: Diagnosis not present

## 2016-12-22 DIAGNOSIS — I252 Old myocardial infarction: Secondary | ICD-10-CM | POA: Diagnosis not present

## 2016-12-22 DIAGNOSIS — E785 Hyperlipidemia, unspecified: Secondary | ICD-10-CM | POA: Diagnosis not present

## 2016-12-22 DIAGNOSIS — I110 Hypertensive heart disease with heart failure: Secondary | ICD-10-CM | POA: Insufficient documentation

## 2016-12-22 DIAGNOSIS — Z79899 Other long term (current) drug therapy: Secondary | ICD-10-CM | POA: Insufficient documentation

## 2016-12-22 DIAGNOSIS — I2510 Atherosclerotic heart disease of native coronary artery without angina pectoris: Secondary | ICD-10-CM | POA: Insufficient documentation

## 2016-12-22 DIAGNOSIS — I480 Paroxysmal atrial fibrillation: Secondary | ICD-10-CM | POA: Diagnosis not present

## 2016-12-22 DIAGNOSIS — Z7901 Long term (current) use of anticoagulants: Secondary | ICD-10-CM | POA: Insufficient documentation

## 2016-12-22 LAB — BASIC METABOLIC PANEL
Anion gap: 9 (ref 5–15)
BUN: 26 mg/dL — ABNORMAL HIGH (ref 6–20)
CALCIUM: 9 mg/dL (ref 8.9–10.3)
CO2: 22 mmol/L (ref 22–32)
Chloride: 109 mmol/L (ref 101–111)
Creatinine, Ser: 1.22 mg/dL — ABNORMAL HIGH (ref 0.44–1.00)
GFR calc non Af Amer: 37 mL/min — ABNORMAL LOW (ref >60)
GFR, EST AFRICAN AMERICAN: 43 mL/min — AB (ref >60)
GLUCOSE: 90 mg/dL (ref 65–99)
Potassium: 3.5 mmol/L (ref 3.5–5.1)
Sodium: 140 mmol/L (ref 135–145)

## 2016-12-22 LAB — CBC
HCT: 34.5 % — ABNORMAL LOW (ref 36.0–46.0)
Hemoglobin: 11.6 g/dL — ABNORMAL LOW (ref 12.0–15.0)
MCH: 31.6 pg (ref 26.0–34.0)
MCHC: 33.6 g/dL (ref 30.0–36.0)
MCV: 94 fL (ref 78.0–100.0)
PLATELETS: 76 10*3/uL — AB (ref 150–400)
RBC: 3.67 MIL/uL — ABNORMAL LOW (ref 3.87–5.11)
RDW: 13.5 % (ref 11.5–15.5)
WBC: 5.8 10*3/uL (ref 4.0–10.5)

## 2016-12-22 MED ORDER — CARVEDILOL 12.5 MG PO TABS: 12.5000 mg | ORAL_TABLET | Freq: Two times a day (BID) | ORAL | 2 refills | Status: DC

## 2016-12-22 NOTE — Progress Notes (Signed)
Patient ID: [name], female   DOB: April 25, 1923, 83 y.o.   MRN: [id number]     Advanced Heart Failure Clinic Note   PCP: Dr Linna Darner HF: Dr. Haroldine Laws   HPI: [name] is a 83 y.o. 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 LHC in 6/05 in setting of NSTEMI: ant apical AK and inf-apical AK, EF 29%, dOM2 70-80% (small). EF has recovered. Also has a h/o HTN, SVT and fractures pelvis 05/2012. 02/2012  Discharged form [facility] after a fall which resulted in a cervical fracture.   01/2012 ECHO EF 55% 08/14/13 ECHO EF 55%, normal RV size and systolic function.   Admitted 4/27 -11/07/16 for evaluation of tachycardia. Had recent holter monitor placed that showed 6.5 second pause. Coreg stopped and referred to EP. She then developed Afib RVR. With pause, PPM was placed 11/06/16 by Dr. Lovena Le.  Noted to be in Afib with controlled ventricular rate on discharge.   Pt presents today for follow up. Since about 6 weeks ago and carvedilol restarted. Says she feels better. No further syncope. SBP at home 130-150. HR on monitor usuall 95-105. Breathing better. Can now do ADLs without problems. Still drives herself to store. Able to push cart. No bleeding with Xarelto. Wants to know if she has to be on it forever due to price. Now getting samples due to high deductible.   Labs (1/15): K 4.3, creatinine 1.8, BNP 49 Labs (6/17): K 4.5, creatinine 1.14 Labs (9/17): K 4.5, Creatinine 1.38 Labs (10/12/16) LK 4.3, Creatinine 1.17  Review of systems complete and found to be negative unless listed in HPI.    Past Medical History:  Diagnosis Date  . AV block, 1st degree   . Bradycardia   . CAD (coronary artery disease)    Non ST elevation 2005 ; LHC in 6/05 in setting of NSTEMI: ant apical AK and inf-apical AK, EF 29%, dOM2 70-80% (small); findings c/w Tako-Tsubo CM  . Cardiomyopathy, nonischemic ([place]) 04/2008   Likely Tako-Tsubo. Resolved. --dx'd on cath 2005. EF 29% with minimal distal  CAD (small OM1 70-80).  Echo 10/09 EF 55%;  echo 01/26/12: EF 55%, mild LAE, PASP 34.  . Colitis, ischemic ([place])   . Complication of anesthesia    post anesthesia excessive somnulence  . Compression fracture of C-spine ([place]) 10/10/2011   Fell at home, tx at [facility]  . Diarrhea associated with pseudomembranous colitis 6/11-17/2013   [facility]  . Dyslipidemia   . Fall    with non-healing rib fractures  . Glaucoma   . Granuloma annulare   . History of phlebitis   . Hypertension    SEVERE  . Idiopathic thrombocytopenic purpura (ITP)   . Lower extremity edema   . Lumbar stenosis 2004   DR. MARK ROY  . Multiple pelvic fractures 05/2012   Dr Adaline Sill , [facility]  . Osteoporosis     Current Outpatient Prescriptions  Medication Sig Dispense Refill  . benazepril (LOTENSIN) 20 MG tablet Take 1 tablet (20 mg total) by mouth daily. 90 tablet 2  . beta carotene w/minerals (OCUVITE) tablet Take 1 tablet by mouth daily.     . brimonidine (ALPHAGAN) 0.2 % ophthalmic solution Place 1 drop into the right eye 2 (two) times daily.      . Calcium Carbonate-Vit D-Min (CALCIUM 1200 PO) Take 1,200 mg by mouth daily.    . carboxymethylcellulose (REFRESH TEARS) 0.5 % SOLN Place 1 drop into both eyes 2 (two)  times daily.     . carvedilol (COREG) 6.25 MG tablet Take 1 tablet (6.25 mg total) by mouth 2 (two) times daily. 180 tablet 3  . Cholecalciferol (VITAMIN D3) 3000 UNITS TABS Take 1 tablet by mouth daily.    Marland Kitchen denosumab (PROLIA) 60 MG/ML SOLN injection Inject 60 mg into the skin every 6 (six) months. Administer in upper arm, thigh, or abdomen    . diphenhydrAMINE (BENADRYL) 25 MG tablet Take 25 mg by mouth at bedtime as needed for sleep (and sinus issues).     . docusate sodium (COLACE) 100 MG capsule Take 100 mg by mouth at bedtime. Stool softener     . furosemide (LASIX) 40 MG tablet Take 1 tablet (40 mg total) by mouth daily. 90 tablet 2  . ibuprofen (ADVIL,MOTRIN) 200 MG tablet Take 200 mg by mouth  daily as needed (for pain).     . Misc Natural Products (OSTEO BI-FLEX ADV JOINT SHIELD) TABS Take 1 tablet by mouth daily.     Marland Kitchen omeprazole (PRILOSEC) 20 MG capsule Take 20 mg by mouth daily.      . rivaroxaban (XARELTO) 15 MG TABS tablet Take 1 tablet (15 mg total) by mouth daily with supper. 30 tablet 4  . sodium chloride (OCEAN) 0.65 % SOLN nasal spray Place 1-2 sprays into both nostrils every 4 (four) hours as needed for congestion.    . timolol (TIMOPTIC) 0.25 % ophthalmic solution Place 1 drop into the right eye 2 (two) times daily.    . vitamin B-12 (CYANOCOBALAMIN) 1000 MCG tablet Take 2,000 mcg by mouth daily.      No current facility-administered medications for this encounter.    PHYSICAL EXAM: Vitals:   12/22/16 1209  BP: (!) 180/88  Pulse: 82  SpO2: 98%  Weight: 176 lb 12.8 oz (80.2 kg)   Wt Readings from Last 3 Encounters:  12/22/16 176 lb 12.8 oz (80.2 kg)  11/09/16 176 lb 9.6 oz (80.1 kg)  11/07/16 177 lb 11.2 oz (80.6 kg)    General:   Elderly appearing. No resp difficulty HEENT: normal Neck: supple. JVP 6 Carotids 2+ bilat; no bruits. No lymphadenopathy or thryomegaly appreciated. Cor: PMI nondisplaced. Regular rate & rhythm. No rubs, gallops or murmurs. Lungs: clear Abdomen: soft, nontender, nondistended. No hepatosplenomegaly. No bruits or masses. Good bowel sounds. Extremities: no cyanosis, clubbing, rash, edema Neuro: alert & orientedx3, cranial nerves grossly intact. moves all 4 extremities w/o difficulty. Affect pleasant    ASSESSMENT & PLAN: 1. Chronic diastolic CHF: History of Takotsubo cardiomyopathy, EF has recovered. - Echo 08/14/13 EF 55-60% with normal RV. Will repeat at next visit - Continue Lasix 40 mg daily - BP high. Will increase carvedilol to 12.5 bid - Continue benazepril 20 mg daily.  - Reinforced fluid restriction to < 2 L daily, sodium restriction to less than 2000 mg daily, and the importance of daily weights.     2. CKD:  - Recent  BMET stable  3. CAD: No ischemic symptoms.  Nonobstructive CAD on prior cath.   - Denies CP. No change.   4. Afib with RVR/Tachybrady syndrome s/p St Jude PPM 11/06/16 This patients CHA2DS2-VASc Score and unadjusted Ischemic Stroke Rate (% per year) is equal to 9.7 % stroke rate/year from a score of 6 Above score calculated as 1 point each if present [CHF, HTN, DM, Vascular=MI/PAD/Aortic Plaque, Age if 65-74, or Female] Above score calculated as 2 points each if present [Age > 75, or Stroke/TIA/TE] - Pacer interrogated  today in clinic and found to have 4% AF burden with well controlled rates. Frequent mode switches due to PACs.  - Long talk about AC. Is tolerating Xarelto well but is getting samples duet to cost. She is working with PharmD to get coverage. Discussed warfarin as an option.  6. HTN  -As above. Increase carvedilol carefully. Avoid hypotension. Goal SBP 130-140 range   Glori Bickers, MD  12/22/2016

## 2016-12-22 NOTE — Patient Instructions (Addendum)
Increase Carvedilol to 12.5 mg Twice daily   Follow up in 6 months with Dr.Bensimhon.

## 2016-12-22 NOTE — Telephone Encounter (Signed)
Called to ask if patient would rather we mail her a form for Xarelto PAP or if she can come into the office to sign it. Left HIPAA-compliant message requesting patient to call back at [phone].   Carlean Jews, Pharm.D. PGY1 Pharmacy Resident 6/12/20182:39 PM Pager [phone]

## 2016-12-22 NOTE — Progress Notes (Signed)
Medication Samples have been provided to the patient.  Drug name: Xarelto       Strength: 15 mg        Qty: 21 tabs  LOT: [id number] Exp.Date: 10/2017  Dosing instructions: Take 1 tablet by mouth daily with supper  The patient has been instructed regarding the correct time, dose, and frequency of taking this medication, including desired effects and most common side effects.   Carlean Jews 12:32 PM 12/22/2016

## 2016-12-22 NOTE — Progress Notes (Signed)
Advanced Heart Failure Medication Review by a Pharmacist  Does the patient  feel that his/her medications are working for him/her?  yes  Has the patient been experiencing any side effects to the medications prescribed?  no  Does the patient measure his/her own blood pressure or blood glucose at home?  yes   Does the patient have any problems obtaining medications due to transportation or finances?   Yes - only Xarelto - have been providing samples  Understanding of regimen: good Understanding of indications: good Potential of compliance: good Patient understands to avoid NSAIDs. Patient understands to avoid decongestants.  Issues to address at subsequent visits: xarelto PA   Pharmacist comments: [name] is a 83 yo female presenting without her pill bottles but with good understand of regimen. Pt took all AM medications prior to visit. Per patient, she is unsure if she is continuing Prolia - next due September, 2018. Patient brought in information from her pharmacy claims. Patient has no further questions or concerns.   Carlean Jews, Pharm.D. PGY1 Pharmacy Resident 6/12/201812:09 PM Pager [phone]   Time with patient: 10 Preparation and documentation time: 3 mins Total time: 13 mins

## 2016-12-23 ENCOUNTER — Other Ambulatory Visit (HOSPITAL_COMMUNITY): Payer: Self-pay | Admitting: Pharmacist

## 2016-12-23 MED ORDER — RIVAROXABAN 15 MG PO TABS: 15.0000 mg | ORAL_TABLET | Freq: Every day | ORAL | 3 refills | Status: DC

## 2017-01-04 DIAGNOSIS — H01021 Squamous blepharitis right upper eyelid: Secondary | ICD-10-CM | POA: Diagnosis not present

## 2017-01-04 DIAGNOSIS — H01024 Squamous blepharitis left upper eyelid: Secondary | ICD-10-CM | POA: Diagnosis not present

## 2017-01-04 DIAGNOSIS — H01022 Squamous blepharitis right lower eyelid: Secondary | ICD-10-CM | POA: Diagnosis not present

## 2017-01-04 DIAGNOSIS — H35363 Drusen (degenerative) of macula, bilateral: Secondary | ICD-10-CM | POA: Diagnosis not present

## 2017-01-04 DIAGNOSIS — Z947 Corneal transplant status: Secondary | ICD-10-CM | POA: Diagnosis not present

## 2017-01-04 DIAGNOSIS — H26492 Other secondary cataract, left eye: Secondary | ICD-10-CM | POA: Diagnosis not present

## 2017-01-04 DIAGNOSIS — Z9842 Cataract extraction status, left eye: Secondary | ICD-10-CM | POA: Diagnosis not present

## 2017-01-04 DIAGNOSIS — H01025 Squamous blepharitis left lower eyelid: Secondary | ICD-10-CM | POA: Diagnosis not present

## 2017-01-04 DIAGNOSIS — Z961 Presence of intraocular lens: Secondary | ICD-10-CM | POA: Diagnosis not present

## 2017-01-04 DIAGNOSIS — Z885 Allergy status to narcotic agent status: Secondary | ICD-10-CM | POA: Diagnosis not present

## 2017-01-04 DIAGNOSIS — Z950 Presence of cardiac pacemaker: Secondary | ICD-10-CM | POA: Diagnosis not present

## 2017-01-04 DIAGNOSIS — Z880 Allergy status to penicillin: Secondary | ICD-10-CM | POA: Diagnosis not present

## 2017-01-04 DIAGNOSIS — H353130 Nonexudative age-related macular degeneration, bilateral, stage unspecified: Secondary | ICD-10-CM | POA: Diagnosis not present

## 2017-01-04 DIAGNOSIS — Z881 Allergy status to other antibiotic agents status: Secondary | ICD-10-CM | POA: Diagnosis not present

## 2017-01-04 DIAGNOSIS — H43813 Vitreous degeneration, bilateral: Secondary | ICD-10-CM | POA: Diagnosis not present

## 2017-01-04 DIAGNOSIS — I10 Essential (primary) hypertension: Secondary | ICD-10-CM | POA: Diagnosis not present

## 2017-01-04 DIAGNOSIS — Z79899 Other long term (current) drug therapy: Secondary | ICD-10-CM | POA: Diagnosis not present

## 2017-01-04 DIAGNOSIS — H401130 Primary open-angle glaucoma, bilateral, stage unspecified: Secondary | ICD-10-CM | POA: Diagnosis not present

## 2017-01-04 DIAGNOSIS — H17812 Minor opacity of cornea, left eye: Secondary | ICD-10-CM | POA: Diagnosis not present

## 2017-01-04 DIAGNOSIS — Z888 Allergy status to other drugs, medicaments and biological substances status: Secondary | ICD-10-CM | POA: Diagnosis not present

## 2017-01-04 DIAGNOSIS — Z9841 Cataract extraction status, right eye: Secondary | ICD-10-CM | POA: Diagnosis not present

## 2017-01-04 DIAGNOSIS — I252 Old myocardial infarction: Secondary | ICD-10-CM | POA: Diagnosis not present

## 2017-01-05 ENCOUNTER — Ambulatory Visit (HOSPITAL_COMMUNITY)
Admission: RE | Admit: 2017-01-05 | Discharge: 2017-01-05 | Disposition: A | Discharge status: Home / Self Care | Payer: Medicare Other | Source: Ambulatory Visit | Attending: Internal Medicine | Admitting: Internal Medicine

## 2017-01-05 DIAGNOSIS — I5032 Chronic diastolic (congestive) heart failure: Secondary | ICD-10-CM

## 2017-01-05 NOTE — Progress Notes (Signed)
[name] came in today for assistance filling out form for Xarelto PA. Paperwork completed and faxed to [employer].   Medication Samples have been provided to the patient.  Drug name: Xarelto       Strength: 15mg         Qty: 14  LOT: [id number] Exp.Date: 07/2018  Dosing instructions: Take 1 tablet by mouth once daily   The patient has been instructed regarding the correct time, dose, and frequency of taking this medication, including desired effects and most common side effects.   Carlean Jews, Pharm.D. PGY1 Pharmacy Resident 6/26/20182:31 PM Pager [phone]

## 2017-01-11 ENCOUNTER — Other Ambulatory Visit: Payer: Self-pay | Admitting: Internal Medicine

## 2017-01-18 ENCOUNTER — Other Ambulatory Visit: Payer: Self-pay | Admitting: Internal Medicine

## 2017-01-25 ENCOUNTER — Telehealth (HOSPITAL_COMMUNITY): Payer: Self-pay

## 2017-01-25 NOTE — Telephone Encounter (Signed)
Patient states she is SOB, fatigued and weak becoming worse over past few days. Reports taking all meds as scheduled, compliant with CHF diet and fluid restrictions.  Denies swelling, fever.  Weight stable. Will schedule to be seen tomorrow in CHF clinic per patient request.  Renee Pain, RN

## 2017-01-26 ENCOUNTER — Ambulatory Visit (HOSPITAL_COMMUNITY)
Admission: RE | Admit: 2017-01-26 | Discharge: 2017-01-26 | Disposition: A | Discharge status: Home / Self Care | Payer: Medicare Other | Source: Ambulatory Visit | Attending: Cardiology | Admitting: Cardiology

## 2017-01-26 ENCOUNTER — Encounter: Payer: Self-pay | Admitting: *Deleted

## 2017-01-26 VITALS — BP 124/80 | HR 74 | Wt 176.6 lb

## 2017-01-26 DIAGNOSIS — I4891 Unspecified atrial fibrillation: Secondary | ICD-10-CM | POA: Diagnosis not present

## 2017-01-26 DIAGNOSIS — Z79899 Other long term (current) drug therapy: Secondary | ICD-10-CM | POA: Insufficient documentation

## 2017-01-26 DIAGNOSIS — I480 Paroxysmal atrial fibrillation: Secondary | ICD-10-CM

## 2017-01-26 DIAGNOSIS — Z5189 Encounter for other specified aftercare: Secondary | ICD-10-CM | POA: Diagnosis not present

## 2017-01-26 DIAGNOSIS — I5032 Chronic diastolic (congestive) heart failure: Secondary | ICD-10-CM

## 2017-01-26 DIAGNOSIS — N183 Chronic kidney disease, stage 3 (moderate): Secondary | ICD-10-CM | POA: Insufficient documentation

## 2017-01-26 DIAGNOSIS — I2510 Atherosclerotic heart disease of native coronary artery without angina pectoris: Secondary | ICD-10-CM | POA: Diagnosis not present

## 2017-01-26 DIAGNOSIS — I10 Essential (primary) hypertension: Secondary | ICD-10-CM | POA: Diagnosis not present

## 2017-01-26 DIAGNOSIS — I130 Hypertensive heart and chronic kidney disease with heart failure and stage 1 through stage 4 chronic kidney disease, or unspecified chronic kidney disease: Secondary | ICD-10-CM | POA: Insufficient documentation

## 2017-01-26 MED ORDER — CARVEDILOL 12.5 MG PO TABS: 6.2500 mg | ORAL_TABLET | Freq: Two times a day (BID) | ORAL | 2 refills | Status: DC

## 2017-01-26 NOTE — Progress Notes (Signed)
Patient ID: [name], female   DOB: 06/18/1923, 83 y.o.   MRN: [id number]     Advanced Heart Failure Clinic Note   PCP: Dr Linna Darner HF: Dr. Haroldine Laws   HPI: ADELEI Fry is a 83 y.o. 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 LHC in 6/05 in setting of NSTEMI: ant apical AK and inf-apical AK, EF 29%, dOM2 70-80% (small). EF has recovered. Also has a h/o HTN, SVT and fractures pelvis 05/2012. 02/2012  Discharged form [facility] after a fall which resulted in a cervical fracture.   Admitted 4/27 -11/07/16 for evaluation of tachycardia. Had recent holter monitor that showed 6.5 second pause. Coreg stopped and referred to EP. She then developed Afib RVR. With pause, PPM was placed 11/06/16 by Dr. Lovena Le.  Noted to be in Afib with controlled ventricular rate on discharge.   Today she returns for HF follow up. Last visit carvedilol was increased from 6.25 to 12.5 mg twice a day. Now complaining of fatigue and dyspnea. Weight at home has been stable 136 pounds. Not moving around much at home. Taking all medications. Requires assistantce with transportation.    01/2012 ECHO EF 55% 08/14/13 ECHO EF 55%, normal RV size and systolic function.   Labs (1/15): K 4.3, creatinine 1.8, BNP 49 Labs (6/17): K 4.5, creatinine 1.14 Labs (9/17): K 4.5, Creatinine 1.38 Labs (10/12/16) LK 4.3, Creatinine 1.17  Review of systems complete and found to be negative unless listed in HPI.    Past Medical History:  Diagnosis Date  . AV block, 1st degree   . Bradycardia   . CAD (coronary artery disease)    Non ST elevation 2005 ; LHC in 6/05 in setting of NSTEMI: ant apical AK and inf-apical AK, EF 29%, dOM2 70-80% (small); findings c/w Tako-Tsubo CM  . Cardiomyopathy, nonischemic ([place]) 04/2008   Likely Tako-Tsubo. Resolved. --dx'd on cath 2005. EF 29% with minimal distal CAD (small OM1 70-80).  Echo 10/09 EF 55%;  echo 01/26/12: EF 55%, mild LAE, PASP 34.  . Colitis, ischemic ([place])   .  Complication of anesthesia    post anesthesia excessive somnulence  . Compression fracture of C-spine ([place]) 10/10/2011   Fell at home, tx [facility]  . Diarrhea associated with pseudomembranous colitis 6/11-17/2013   [facility]  . Dyslipidemia   . Fall    with non-healing rib fractures  . Glaucoma   . Granuloma annulare   . History of phlebitis   . Hypertension    SEVERE  . Idiopathic thrombocytopenic purpura (ITP)   . Lower extremity edema   . Lumbar stenosis 2004   DR. MARK ROY  . Multiple pelvic fractures 05/2012   Dr Adaline Sill , [facility]  . Osteoporosis     Current Outpatient Prescriptions  Medication Sig Dispense Refill  . benazepril (LOTENSIN) 20 MG tablet Take 1 tablet (20 mg total) by mouth daily. 90 tablet 1  . beta carotene w/minerals (OCUVITE) tablet Take 1 tablet by mouth daily.     . brimonidine (ALPHAGAN) 0.2 % ophthalmic solution Place 1 drop into the right eye 2 (two) times daily.      . Calcium Carbonate-Vit D-Min (CALCIUM 1200 PO) Take 1,200 mg by mouth daily.    . carboxymethylcellulose (REFRESH TEARS) 0.5 % SOLN Place 1 drop into both eyes 2 (two) times daily.     . carvedilol (COREG) 12.5 MG tablet Take 1 tablet (12.5 mg total) by mouth 2 (two) times daily. Zeba  tablet 2  . Cholecalciferol (VITAMIN D3) 3000 UNITS TABS Take 1 tablet by mouth daily.    Marland Kitchen denosumab (PROLIA) 60 MG/ML SOLN injection Inject 60 mg into the skin every 6 (six) months. Administer in upper arm, thigh, or abdomen    . diphenhydrAMINE (BENADRYL) 25 MG tablet Take 25 mg by mouth at bedtime as needed for sleep (and sinus issues).     . docusate sodium (COLACE) 100 MG capsule Take 100 mg by mouth daily as needed for mild constipation. Stool softener     . furosemide (LASIX) 40 MG tablet Take 1 tablet (40 mg total) by mouth daily. 90 tablet 2  . Misc Natural Products (OSTEO BI-FLEX ADV JOINT SHIELD) TABS Take 1 tablet by mouth daily.     Marland Kitchen omeprazole (PRILOSEC) 20 MG capsule Take 20 mg by mouth  daily.      . Rivaroxaban (XARELTO) 15 MG TABS tablet Take 1 tablet (15 mg total) by mouth daily with supper. 90 tablet 3  . sodium chloride (OCEAN) 0.65 % SOLN nasal spray Place 1-2 sprays into both nostrils every 4 (four) hours as needed for congestion.    . timolol (TIMOPTIC) 0.25 % ophthalmic solution Place 1 drop into the right eye 2 (two) times daily.    . vitamin B-12 (CYANOCOBALAMIN) 1000 MCG tablet Take 2,000 mcg by mouth daily.     Marland Kitchen ibuprofen (ADVIL,MOTRIN) 200 MG tablet Take 200 mg by mouth daily as needed (for pain).      No current facility-administered medications for this encounter.    PHYSICAL EXAM: Vitals:   01/26/17 1521  BP: 124/80  Pulse: 74  SpO2: 98%  Weight: 176 lb 9.6 oz (80.1 kg)   Wt Readings from Last 3 Encounters:  01/26/17 176 lb 9.6 oz (80.1 kg)  12/22/16 176 lb 12.8 oz (80.2 kg)  11/09/16 176 lb 9.6 oz (80.1 kg)    General:  Well appearing. No resp difficulty. Walked slowly in the clinic.  HEENT: normal Neck: supple. JVP7-8. Carotids 2+ bilat; no bruits. No lymphadenopathy or thryomegaly appreciated. Cor: PMI nondisplaced. Regular rate & rhythm. No rubs, gallops or murmurs. Lungs: clear Abdomen: soft, nontender, nondistended. No hepatosplenomegaly. No bruits or masses. Good bowel sounds. Extremities: no cyanosis, clubbing, rash, edema Neuro: alert & orientedx3, cranial nerves grossly intact. moves all 4 extremities w/o difficulty. Affect pleasant    ASSESSMENT & PLAN: 1. Chronic diastolic CHF: History of Takotsubo cardiomyopathy, EF has recovered. - Echo 08/14/13 EF 55-60% with normal RV.  Volume status mildly elevated. Continue lasix 40 mg daily and she will take an extra 20 mg lasix for the next 2 days.  Cut back carvedilol to 6.25 mg twice a day due to fatigue.  - Continue benazepril 20 mg daily.  - Reinforced fluid restriction to < 2 L daily, sodium restriction to less than 2000 mg daily, and the importance of daily weights.     2. CKD Stage  III: Reviewed BME Tfrom 12/22/2016.  3. CAD: No ischemic symptoms.  Nonobstructive CAD on prior cath.   No CP.   4. Afib with RVR/Tachybrady syndrome s/p St Jude PPM 11/06/16 Regular pulse. This patients CHA2DS2-VASc Score and unadjusted Ischemic Stroke Rate (% per year) is equal to 9.7 % stroke rate/year from a score of 6 Above score calculated as 1 point each if present [CHF, HTN, DM, Vascular=MI/PAD/Aortic Plaque, Age if 65-74, or Female] Above score calculated as 2 points each if present [Age > 75, or Stroke/TIA/TE] Continue xarelto.  --  6. HTN  -Stable.   Follow up in 8 weeks. I called the pharmacy to verify dose of carvedilol.  Greater than 50% of the (total minutes 25) visit spent in counseling/coordination of care regarding heart failure and medications.  Darrick Grinder, NP  01/26/2017

## 2017-01-26 NOTE — Patient Instructions (Signed)
Take extra 20 mg of Lasix for the next 2 days, then resume your regular dose.  DECREASE carvedilol to 6.25 mg (0.5 Tablet) Twice Daily  Follow up with Dr. Haroldine Laws in 8 weeks

## 2017-01-26 NOTE — Progress Notes (Addendum)
Advanced Heart Failure Medication Review by a Pharmacist  Does the patient  feel that his/her medications are working for him/her?  yes  Has the patient been experiencing any side effects to the medications prescribed?  yes  Does the patient measure his/her own blood pressure or blood glucose at home?  yes   Does the patient have any problems obtaining medications due to transportation or finances?   Yes - Xarelto patient assistance application still IP  Understanding of regimen: good Understanding of indications: good Potential of compliance: good Patient understands to avoid NSAIDs. Patient understands to avoid decongestants.  Issues to address at subsequent visits: None   Pharmacist comments:  [name] is a pleasant 83 yo F presenting with her son and a current medication list. She reports good compliance with her regimen and her only concern is her increased fatigue with the increase in carvedilol that has not improved over the past month. She did not have any other medication-related questions or concerns for me at this time.   ADDENDUM:  Verified that last carvedilol filled from [facility] mail order pharmacy was for the 12.5 mg BID  [name], PharmD, BCPS, CPP Clinical Pharmacist Pager: [phone] Phone: [phone] 01/26/2017 3:24 PM      Time with patient: 14 minutes Preparation and documentation time: 2 minutes Total time: 16 minutes

## 2017-02-09 ENCOUNTER — Encounter: Payer: Self-pay | Admitting: Internal Medicine

## 2017-02-09 ENCOUNTER — Ambulatory Visit (INDEPENDENT_AMBULATORY_CARE_PROVIDER_SITE_OTHER): Payer: Medicare Other | Admitting: Internal Medicine

## 2017-02-09 ENCOUNTER — Encounter (INDEPENDENT_AMBULATORY_CARE_PROVIDER_SITE_OTHER): Payer: Self-pay

## 2017-02-09 VITALS — BP 146/100 | HR 109 | Ht 64.0 in | Wt 178.2 lb

## 2017-02-09 DIAGNOSIS — I480 Paroxysmal atrial fibrillation: Secondary | ICD-10-CM | POA: Diagnosis not present

## 2017-02-09 DIAGNOSIS — I471 Supraventricular tachycardia: Secondary | ICD-10-CM | POA: Diagnosis not present

## 2017-02-09 DIAGNOSIS — I2510 Atherosclerotic heart disease of native coronary artery without angina pectoris: Secondary | ICD-10-CM

## 2017-02-09 DIAGNOSIS — I442 Atrioventricular block, complete: Secondary | ICD-10-CM

## 2017-02-09 DIAGNOSIS — R002 Palpitations: Secondary | ICD-10-CM

## 2017-02-09 LAB — CUP PACEART INCLINIC DEVICE CHECK
Battery Remaining Longevity: 129 mo
Battery Voltage: 2.99 V
Brady Statistic RA Percent Paced: 4.7 %
Date Time Interrogation Session: 20180731132824
Implantable Lead Location: 753859
Lead Channel Impedance Value: 625 Ohm
Lead Channel Pacing Threshold Amplitude: 0.75 V
Lead Channel Pacing Threshold Amplitude: 1 V
Lead Channel Pacing Threshold Amplitude: 1 V
Lead Channel Pacing Threshold Pulse Width: 0.5 ms
Lead Channel Pacing Threshold Pulse Width: 0.5 ms
Lead Channel Pacing Threshold Pulse Width: 0.5 ms
Lead Channel Sensing Intrinsic Amplitude: 2.8 mV
MDC IDC LEAD IMPLANT DT: 20180427
MDC IDC LEAD IMPLANT DT: 20180427
MDC IDC LEAD LOCATION: 753860
MDC IDC MSMT LEADCHNL RA IMPEDANCE VALUE: 450 Ohm
MDC IDC MSMT LEADCHNL RV PACING THRESHOLD AMPLITUDE: 0.75 V
MDC IDC MSMT LEADCHNL RV PACING THRESHOLD PULSEWIDTH: 0.5 ms
MDC IDC MSMT LEADCHNL RV SENSING INTR AMPL: 12 mV
MDC IDC PG IMPLANT DT: 20180427
MDC IDC PG SERIAL: 8900487
MDC IDC SET LEADCHNL RA PACING AMPLITUDE: 2.5 V
MDC IDC SET LEADCHNL RV PACING AMPLITUDE: 2.5 V
MDC IDC SET LEADCHNL RV PACING PULSEWIDTH: 0.5 ms
MDC IDC SET LEADCHNL RV SENSING SENSITIVITY: 2 mV
MDC IDC STAT BRADY RV PERCENT PACED: 6 %

## 2017-02-09 MED ORDER — RIVAROXABAN 15 MG PO TABS: 15.0000 mg | ORAL_TABLET | Freq: Every day | ORAL | 11 refills | Status: DC

## 2017-02-09 NOTE — Patient Instructions (Addendum)
Medication Instructions:  Your physician recommends that you continue on your current medications as directed. Please refer to the Current Medication list given to you today.   Labwork: None ordered.   Testing/Procedures: None ordered.   Follow-Up: Your physician wants you to follow-up in: 9 months with Dr. Lovena Le.  You will receive a reminder letter in the mail two months in advance. If you don't receive a letter, please call our office to schedule the follow-up appointment.  Remote monitoring is used to monitor your Pacemaker from home. This monitoring reduces the number of office visits required to check your device to one time per year. It allows [place] to keep an eye on the functioning of your device to ensure it is working properly. You are scheduled for a device check from home on 05/11/2017. You may send your transmission at any time that day. If you have a wireless device, the transmission will be sent automatically. After your physician reviews your transmission, you will receive a postcard with your next transmission date.    Any Other Special Instructions Will Be Listed Below (If Applicable).     If you need a refill on your cardiac medications before your next appointment, please call your pharmacy.

## 2017-02-09 NOTE — Progress Notes (Signed)
HPI [name] returns today for followup of atrial arrhythmias and sinus node dysfunction s/p PPM insertion. She is a pleasant 83 yo woman with a h/o syncope and found to have sinus node dysfunction, s/p PPM insertion. She has done well in the interim. No chest pain. She has occaisional palpitations.  Allergies  Allergen Reactions  . Morphine And Related Anaphylaxis and Nausea Only    Per family violently ill  . Oxycodone Other (See Comments)    Dizziness   . Penicillins     Joint edema Has patient had a PCN reaction causing immediate rash, facial/tongue/throat swelling, SOB or lightheadedness with hypotension: Yes Has patient had a PCN reaction causing severe rash involving mucus membranes or skin necrosis: No Has patient had a PCN reaction that required hospitalization: No Has patient had a PCN reaction occurring within the last 10 years: No If all of the above answers are "NO", then may proceed with Cephalosporin use.   . Colchicine Other (See Comments)    Hair loss  . Hydroxychloroquine Other (See Comments)    Unknown reaction  . Norvasc [Amlodipine Besylate]     Reaction not recalled by the patient  . Ofloxacin Other (See Comments)    Unknown   . Streptomycin Other (See Comments)    Unknown  . Tramadol Itching  . Alendronate Sodium Palpitations     Current Outpatient Prescriptions  Medication Sig Dispense Refill  . benazepril (LOTENSIN) 20 MG tablet Take 1 tablet (20 mg total) by mouth daily. 90 tablet 1  . beta carotene w/minerals (OCUVITE) tablet Take 1 tablet by mouth daily.     . brimonidine (ALPHAGAN) 0.2 % ophthalmic solution Place 1 drop into the right eye 2 (two) times daily.      . Calcium Carbonate-Vit D-Min (CALCIUM 1200 PO) Take 1,200 mg by mouth daily.    . carboxymethylcellulose (REFRESH TEARS) 0.5 % SOLN Place 1 drop into both eyes 2 (two) times daily.     . carvedilol (COREG) 12.5 MG tablet Take 0.5 tablets (6.25 mg total) by mouth 2 (two) times  daily. 90 tablet 2  . Cholecalciferol (VITAMIN D3) 3000 UNITS TABS Take 1 tablet by mouth daily.    Marland Kitchen denosumab (PROLIA) 60 MG/ML SOLN injection Inject 60 mg into the skin every 6 (six) months. Administer in upper arm, thigh, or abdomen    . diphenhydrAMINE (BENADRYL) 25 MG tablet Take 25 mg by mouth at bedtime as needed for sleep (and sinus issues).     . docusate sodium (COLACE) 100 MG capsule Take 100 mg by mouth daily as needed for mild constipation. Stool softener     . furosemide (LASIX) 40 MG tablet Take 1 tablet (40 mg total) by mouth daily. 90 tablet 2  . ibuprofen (ADVIL,MOTRIN) 200 MG tablet Take 200 mg by mouth daily as needed (for pain).     . Misc Natural Products (OSTEO BI-FLEX ADV JOINT SHIELD) TABS Take 1 tablet by mouth daily.     Marland Kitchen omeprazole (PRILOSEC) 20 MG capsule Take 20 mg by mouth daily.      . Rivaroxaban (XARELTO) 15 MG TABS tablet Take 1 tablet (15 mg total) by mouth daily with supper. 90 tablet 3  . sodium chloride (OCEAN) 0.65 % SOLN nasal spray Place 1-2 sprays into both nostrils every 4 (four) hours as needed for congestion.    . timolol (TIMOPTIC) 0.25 % ophthalmic solution Place 1 drop into the right eye 2 (two) times daily.    Marland Kitchen  vitamin B-12 (CYANOCOBALAMIN) 1000 MCG tablet Take 2,000 mcg by mouth daily.      No current facility-administered medications for this visit.      Past Medical History:  Diagnosis Date  . AV block, 1st degree   . Bradycardia   . CAD (coronary artery disease)    Non ST elevation 2005 ; LHC in 6/05 in setting of NSTEMI: ant apical AK and inf-apical AK, EF 29%, dOM2 70-80% (small); findings c/w Tako-Tsubo CM  . Cardiomyopathy, nonischemic ([place]) 04/2008   Likely Tako-Tsubo. Resolved. --dx'd on cath 2005. EF 29% with minimal distal CAD (small OM1 70-80).  Echo 10/09 EF 55%;  echo 01/26/12: EF 55%, mild LAE, PASP 34.  . Colitis, ischemic ([place])   . Complication of anesthesia    post anesthesia excessive somnulence  . Compression  fracture of C-spine ([place]) 10/10/2011   Fell at home, tx at [facility]  . Diarrhea associated with pseudomembranous colitis 6/11-17/2013   [facility]  . Dyslipidemia   . Fall    with non-healing rib fractures  . Glaucoma   . Granuloma annulare   . History of phlebitis   . Hypertension    SEVERE  . Idiopathic thrombocytopenic purpura (ITP)   . Lower extremity edema   . Lumbar stenosis 2004   DR. MARK ROY  . Multiple pelvic fractures 05/2012   Dr Adaline Sill , [facility]  . Osteoporosis     ROS:   All systems reviewed and negative except as noted in the HPI.   Past Surgical History:  Procedure Laterality Date  . ABDOMINAL HYSTERECTOMY    . APPENDECTOMY    . BACK SURGERY  10/2004   LS Disc 4-5 no sx.  Marland Kitchen CARDIAC CATHETERIZATION     12/2003  . CATARACT EXTRACTION, BILATERAL    . COLONOSCOPY  09/2005   adenoma  . COLONOSCOPY W/ POLYPECTOMY    . 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 PACEMAKER IMPLANT N/A 11/06/2016   Procedure: Pacemaker Implant;  Surgeon: Evans Lance, MD;  Location: [place] [facility];  Service: Cardiovascular;  Laterality: N/A;  . rectal bleed  05-01-06   colonoscopy-ischemic colitis  . ROTATOR CUFF REPAIR    . VERTEBROPLASTY     Dr Gladstone Lighter     Family History  Problem Relation Age of Onset  . Coronary artery disease Mother   . Heart failure Mother   . Renal cancer Mother   . Kidney disease Mother   . Stroke Father        in his 67s  . Colon cancer Father   . Breast cancer Sister         X 2  . Rectal cancer Sister   . Breast cancer Sister   . Diabetes Neg Hx      Social History   Social History  . Marital status: Widowed    Spouse name: N/A  . Number of children: 1  . Years of education: N/A   Occupational History  . retired Retired   Social History Main Topics  . Smoking status: Never Smoker  . Smokeless tobacco: Never Used  . Alcohol use No  . Drug use: No  . Sexual activity: Not on file   Other Topics Concern  . Not on  file   Social History Narrative   Widowed   Lives by herself in a town house, drives    Son [name]         BP (!) 146/100  Pulse (!) 109   Ht 5\' 4"  (1.626 m)   Wt 178 lb 3.2 oz (80.8 kg)   SpO2 93%   BMI 30.59 kg/m   Physical Exam:  Well appearing 83 yo woman, NAD HEENT: Unremarkable Neck:  6 cm JVD, no thyromegally Lymphatics:  No adenopathy Back:  No CVA tenderness Lungs:  Clear with no wheezes HEART:  Regular tachy rhythm, no murmurs, no rubs, no clicks Abd:  soft, positive bowel sounds, no organomegally, no rebound, no guarding Ext:  2 plus pulses, no edema, no cyanosis, no clubbing Skin:  No rashes no nodules Neuro:  CN II through XII intact, motor grossly intact  EKG - atrial tachycardia at 110/min  DEVICE  Normal device function.  See PaceArt for details. We paced her back to NSR by atrial pacing at 380 ms.  Assess/Plan: 1. Atrial tachycardia - she has been out of rhythm about 50% of the time. I have paced her back into NSR. She is not too symptomatic. Will hold off on any AA drug therapy at this time. 2. Sinus node dysfunction - she is asymptomatic s/p PPM insertion. 3. PPM - her St. Jude DDD PM is working normally. Will recheck in several months. 4. HTN - her blood pressure is elevated today but she notes that at home it has been ok. I will not change her meds at this time.   Mikle Bosworth.D.

## 2017-02-10 ENCOUNTER — Telehealth (HOSPITAL_COMMUNITY): Payer: Self-pay | Admitting: Pharmacist

## 2017-02-10 ENCOUNTER — Encounter: Payer: Self-pay | Admitting: Internal Medicine

## 2017-02-10 ENCOUNTER — Ambulatory Visit (INDEPENDENT_AMBULATORY_CARE_PROVIDER_SITE_OTHER): Payer: Medicare Other | Admitting: Internal Medicine

## 2017-02-10 VITALS — BP 138/88 | HR 95 | Temp 97.7°F | Resp 14 | Ht 64.0 in | Wt 178.5 lb

## 2017-02-10 DIAGNOSIS — I2510 Atherosclerotic heart disease of native coronary artery without angina pectoris: Secondary | ICD-10-CM

## 2017-02-10 DIAGNOSIS — I5032 Chronic diastolic (congestive) heart failure: Secondary | ICD-10-CM | POA: Diagnosis not present

## 2017-02-10 DIAGNOSIS — I10 Essential (primary) hypertension: Secondary | ICD-10-CM

## 2017-02-10 DIAGNOSIS — D693 Immune thrombocytopenic purpura: Secondary | ICD-10-CM

## 2017-02-10 NOTE — Assessment & Plan Note (Signed)
CHF, arrhythmias: closely  follow-up by the CHF clinic and cardiology. Recently carvedilol dose was decreased and she feels better, less weak. HTN: Currently on benazepril, carvedilol, Lasix. BP is usually in the 140s, occasionally slightly higher than that at home. Last BMP satisfactory. No change. Dyslipidemia: Last LDL January 2000 1894, on no medication. ITP: Last platelet count satisfactory. Medication list includes ibuprofen but she takes that very frequently, I actually encouraged her to take Tylenol instead for safety reasons. RTC 4 months.

## 2017-02-10 NOTE — Patient Instructions (Addendum)
  GO TO THE FRONT DESK Schedule your next appointment for a  Check up in 4 months

## 2017-02-10 NOTE — Progress Notes (Signed)
Subjective:    Patient ID: [name], female    DOB: 08-19-1922, 83 y.o.   MRN: [id number]  DOS:  02/10/2017 Type of visit - description : rov Interval history: Since the last visit here, she was admitted to hospital, Subsequently seen by the cardiology hand CHF clinic. Notes reviewed Medication list reviewed.  Wt Readings from Last 3 Encounters:  02/10/17 178 lb 8 oz (81 kg)  02/09/17 178 lb 3.2 oz (80.8 kg)  01/26/17 176 lb 9.6 oz (80.1 kg)     Review of Systems Currently doing okay. For a while she was feeling weak, short of breath, could not walk due to leg weakness. Eventually, carvedilol dose was decreased 01/26/2017 and now she feels better. Has edema sometimes, better after she takes her diuretics. Still driving, denies anxiety or depression  Past Medical History:  Diagnosis Date  . AV block, 1st degree   . Bradycardia   . CAD (coronary artery disease)    Non ST elevation 2005 ; LHC in 6/05 in setting of NSTEMI: ant apical AK and inf-apical AK, EF 29%, dOM2 70-80% (small); findings c/w Tako-Tsubo CM  . Cardiomyopathy, nonischemic ([place]) 04/2008   Likely Tako-Tsubo. Resolved. --dx'd on cath 2005. EF 29% with minimal distal CAD (small OM1 70-80).  Echo 10/09 EF 55%;  echo 01/26/12: EF 55%, mild LAE, PASP 34.  . Colitis, ischemic ([place])   . Complication of anesthesia    post anesthesia excessive somnulence  . Compression fracture of C-spine ([place]) 10/10/2011   Fell at home, tx at [facility]  . Diarrhea associated with pseudomembranous colitis 6/11-17/2013   [facility]  . Dyslipidemia   . Fall    with non-healing rib fractures  . Glaucoma   . Granuloma annulare   . History of phlebitis   . Hypertension    SEVERE  . Idiopathic thrombocytopenic purpura (ITP)   . Lower extremity edema   . Lumbar stenosis 2004   DR. MARK ROY  . Multiple pelvic fractures 05/2012   Dr Adaline Sill , [facility]  . Osteoporosis     Past Surgical History:  Procedure Laterality Date  .  ABDOMINAL HYSTERECTOMY    . APPENDECTOMY    . BACK SURGERY  10/2004   LS Disc 4-5 no sx.  Marland Kitchen CARDIAC CATHETERIZATION     12/2003  . CATARACT EXTRACTION, BILATERAL    . COLONOSCOPY  09/2005   adenoma  . COLONOSCOPY W/ POLYPECTOMY    . 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 PACEMAKER IMPLANT N/A 11/06/2016   Procedure: Pacemaker Implant;  Surgeon: Evans Lance, MD;  Location: [place] CV LAB;  Service: Cardiovascular;  Laterality: N/A;  . rectal bleed  05-01-06   colonoscopy-ischemic colitis  . ROTATOR CUFF REPAIR    . VERTEBROPLASTY     Dr Gladstone Lighter    Social History   Social History  . Marital status: Widowed    Spouse name: N/A  . Number of children: 1  . Years of education: N/A   Occupational History  . retired Retired   Social History Main Topics  . Smoking status: Never Smoker  . Smokeless tobacco: Never Used  . Alcohol use No  . Drug use: No  . Sexual activity: Not on file   Other Topics Concern  . Not on file   Social History Narrative   Widowed   Lives by herself in a town house, drives    Son Cantrell Martus  Allergies as of 02/10/2017      Reactions   Morphine And Related Anaphylaxis, Nausea Only   Per family violently ill   Oxycodone Other (See Comments)   Dizziness    Penicillins    Joint edema Has patient had a PCN reaction causing immediate rash, facial/tongue/throat swelling, SOB or lightheadedness with hypotension: Yes Has patient had a PCN reaction causing severe rash involving mucus membranes or skin necrosis: No Has patient had a PCN reaction that required hospitalization: No Has patient had a PCN reaction occurring within the last 10 years: No If all of the above answers are "NO", then may proceed with Cephalosporin use.   Colchicine Other (See Comments)   Hair loss   Hydroxychloroquine Other (See Comments)   Unknown reaction   Norvasc [amlodipine Besylate]    Reaction not recalled by the patient   Ofloxacin Other (See  Comments)   Unknown   Streptomycin Other (See Comments)   Unknown   Tramadol Itching   Alendronate Sodium Palpitations      Medication List       Accurate as of 02/10/17 10:11 PM. Always use your most recent med list.          benazepril 20 MG tablet Commonly known as:  LOTENSIN Take 1 tablet (20 mg total) by mouth daily.   beta carotene w/minerals tablet Take 1 tablet by mouth daily.   brimonidine 0.2 % ophthalmic solution Commonly known as:  ALPHAGAN Place 1 drop into the right eye 2 (two) times daily.   CALCIUM 1200 PO Take 1,200 mg by mouth daily.   carvedilol 12.5 MG tablet Commonly known as:  COREG Take 0.5 tablets (6.25 mg total) by mouth 2 (two) times daily.   denosumab 60 MG/ML Soln injection Commonly known as:  PROLIA Inject 60 mg into the skin every 6 (six) months. Administer in upper arm, thigh, or abdomen   diphenhydrAMINE 25 MG tablet Commonly known as:  BENADRYL Take 25 mg by mouth at bedtime as needed for sleep (and sinus issues).   docusate sodium 100 MG capsule Commonly known as:  COLACE Take 100 mg by mouth daily as needed for mild constipation. Stool softener   furosemide 40 MG tablet Commonly known as:  LASIX Take 1 tablet (40 mg total) by mouth daily.   ibuprofen 200 MG tablet Commonly known as:  ADVIL,MOTRIN Take 200 mg by mouth daily as needed (for pain).   omeprazole 20 MG capsule Commonly known as:  PRILOSEC Take 20 mg by mouth daily.   OSTEO BI-FLEX ADV JOINT SHIELD Tabs Take 1 tablet by mouth daily.   REFRESH TEARS 0.5 % Soln Generic drug:  carboxymethylcellulose Place 1 drop into both eyes 2 (two) times daily.   Rivaroxaban 15 MG Tabs tablet Commonly known as:  XARELTO Take 1 tablet (15 mg total) by mouth daily with supper.   sodium chloride 0.65 % Soln nasal spray Commonly known as:  OCEAN Place 1-2 sprays into both nostrils every 4 (four) hours as needed for congestion.   timolol 0.25 % ophthalmic solution Commonly  known as:  TIMOPTIC Place 1 drop into the right eye 2 (two) times daily.   vitamin B-12 1000 MCG tablet Commonly known as:  CYANOCOBALAMIN Take 2,000 mcg by mouth daily.   Vitamin D3 3000 units Tabs Take 1 tablet by mouth daily.          Objective:   Physical Exam BP 138/88 (BP Location: Left Arm, Patient Position: Sitting, Cuff Size: Small)  Pulse 95   Temp 97.7 F (36.5 C) (Oral)   Resp 14   Ht _0  (1.626 m)   Wt 178 lb 8 oz (81 kg)   SpO2 95%   BMI 30.64 kg/m  General:   Well developed, well nourished . NAD.  HEENT:  Normocephalic . Face symmetric, atraumatic Lungs:  CTA B Normal respiratory effort, no intercostal retractions, no accessory muscle use. Heart: RRR,  no murmur.  No pretibial edema bilaterally  Skin: Not pale. Not jaundice Neurologic:  alert & oriented X3.  Speech normal, gait appropriate for age   Psych--  Cognition and judgment appear intact.  Cooperative with normal attention span and concentration.  Behavior appropriate. No anxious or depressed appearing.      Assessment & Plan:   Assessment   HTN severe CKD: creat ~1.4, 1.5 Dyslipidemia Hematology: --ITP-- hematology visit for 11-05-14, released to the care of PCP , check CBCs every 4 months, consult hematology if platelets <50 K --Pernicious anemia --Anemia of chronic disease CV: --CAD --Tako Tsubo cardiomyopathy --Diastolic  CHF, resolved - used to see Dr Missy Sabins --AV block first-degree; tachy brady syndrome admitted 10-2016: pacemaker implanted   -Afib RVR DJD: --Compression fractures --PROLIA: Rx elsewhere --Lumbar stenosis Dr. Carloyn Manner --Multiple pelvic fractures , cervical spine compression  fracture 2013 [facility] --H/o nonhealing rib  Osteoporosis: Dr. Agapito Games; s/p forteo, s/p  Prolia 09-2015. Last DEXA 09-29-16 OPHT: --Glaucoma  --S/p cornea transplant -- mac degeneration h/o granuloma annulare   H/o pseudomembranous colitis 2013  PLAN: CHF, arrhythmias: closely   follow-up by the CHF clinic and cardiology. Recently carvedilol dose was decreased and she feels better, less weak. HTN: Currently on benazepril, carvedilol, Lasix. BP is usually in the 140s, occasionally slightly higher than that at home. Last BMP satisfactory. No change. Dyslipidemia: Last LDL January 2000 1894, on no medication. ITP: Last platelet count satisfactory. Medication list includes ibuprofen but she takes that very frequently, I actually encouraged her to take Tylenol instead for safety reasons. RTC 4 months.

## 2017-02-10 NOTE — Progress Notes (Signed)
Pre visit review using our clinic review tool, if applicable. No additional management support is needed unless otherwise documented below in the visit note. 

## 2017-02-10 NOTE — Telephone Encounter (Signed)
[employer] patient assistance approved for Xarelto 15 mg daily through 01/22/18.   Ruta Hinds. Velva Harman, PharmD, BCPS, CPP Clinical Pharmacist Pager: [phone] Phone: [phone] 02/10/2017 11:27 AM

## 2017-03-19 ENCOUNTER — Ambulatory Visit (HOSPITAL_COMMUNITY)
Admission: RE | Admit: 2017-03-19 | Discharge: 2017-03-19 | Disposition: A | Discharge status: Home / Self Care | Payer: Medicare Other | Source: Ambulatory Visit | Attending: Internal Medicine | Admitting: Internal Medicine

## 2017-03-19 ENCOUNTER — Encounter (HOSPITAL_COMMUNITY): Payer: Self-pay | Admitting: Internal Medicine

## 2017-03-19 VITALS — BP 152/100 | HR 100 | Wt 175.8 lb

## 2017-03-19 DIAGNOSIS — I480 Paroxysmal atrial fibrillation: Secondary | ICD-10-CM | POA: Diagnosis not present

## 2017-03-19 DIAGNOSIS — I110 Hypertensive heart disease with heart failure: Secondary | ICD-10-CM | POA: Insufficient documentation

## 2017-03-19 DIAGNOSIS — I2510 Atherosclerotic heart disease of native coronary artery without angina pectoris: Secondary | ICD-10-CM

## 2017-03-19 DIAGNOSIS — I471 Supraventricular tachycardia: Secondary | ICD-10-CM | POA: Insufficient documentation

## 2017-03-19 DIAGNOSIS — Z7902 Long term (current) use of antithrombotics/antiplatelets: Secondary | ICD-10-CM | POA: Diagnosis not present

## 2017-03-19 DIAGNOSIS — Z8619 Personal history of other infectious and parasitic diseases: Secondary | ICD-10-CM | POA: Insufficient documentation

## 2017-03-19 DIAGNOSIS — H409 Unspecified glaucoma: Secondary | ICD-10-CM | POA: Diagnosis not present

## 2017-03-19 DIAGNOSIS — E785 Hyperlipidemia, unspecified: Secondary | ICD-10-CM | POA: Diagnosis not present

## 2017-03-19 DIAGNOSIS — M810 Age-related osteoporosis without current pathological fracture: Secondary | ICD-10-CM | POA: Insufficient documentation

## 2017-03-19 DIAGNOSIS — I495 Sick sinus syndrome: Secondary | ICD-10-CM | POA: Insufficient documentation

## 2017-03-19 DIAGNOSIS — I5032 Chronic diastolic (congestive) heart failure: Secondary | ICD-10-CM | POA: Diagnosis not present

## 2017-03-19 DIAGNOSIS — I429 Cardiomyopathy, unspecified: Secondary | ICD-10-CM | POA: Diagnosis not present

## 2017-03-19 DIAGNOSIS — I252 Old myocardial infarction: Secondary | ICD-10-CM | POA: Diagnosis not present

## 2017-03-19 DIAGNOSIS — I214 Non-ST elevation (NSTEMI) myocardial infarction: Secondary | ICD-10-CM | POA: Diagnosis not present

## 2017-03-19 MED ORDER — BENAZEPRIL HCL 20 MG PO TABS: 20.0000 mg | ORAL_TABLET | Freq: Every day | ORAL | 3 refills | Status: DC

## 2017-03-19 MED ORDER — FUROSEMIDE 40 MG PO TABS: 40.0000 mg | ORAL_TABLET | Freq: Every day | ORAL | 3 refills | Status: DC

## 2017-03-19 NOTE — Patient Instructions (Addendum)
Take an extra Furosimide 20 mg (1/2) tab for weight gain--if you are 174 lbs or more  Take extra Benazipril 20 mg for systolic blood pressure (top number of BP) is 180 or higher   Your physician recommends that you schedule a follow-up appointment in: 6 months

## 2017-03-19 NOTE — Progress Notes (Signed)
Patient ID: [name], female   DOB: 09-Dec-1922, 83 y.o.   MRN: [id number]     Advanced Heart Failure Clinic Note   PCP: Dr Linna Darner HF: Dr. Haroldine Laws   HPI: [name] is a 83 y.o. 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 LHC in 6/05 in setting of NSTEMI: ant apical AK and inf-apical AK, EF 29%, dOM2 70-80% (small). EF has recovered. Also has a h/o HTN, SVT and fractures pelvis 05/2012. 02/2012  Discharged form [place] after a fall which resulted in a cervical fracture.   Admitted 4/27 -11/07/16 for evaluation of tachycardia. Had recent holter monitor that showed 6.5 second pause. Coreg stopped and referred to EP. She then developed Afib RVR. With pause, PPM was placed 11/06/16 by Dr. Lovena Le.  Noted to be in Afib with controlled ventricular rate on discharge.   Today she returns for HF follow up. Last visit was fluid overloaded. Given additional for 2 days. Cut back carvedilol to 6.25 mg twice a day due to fatigue. Feels much better. Mild edema. Occasioally takes extra lasix when weight 174 or greater and it helps.Able to go to store. Less fatigue on lower carvedilol. SBP at home 128-170. Mostly 130-140. No bleeding with Xarelto.   01/2012 ECHO EF 55% 08/14/13 ECHO EF 55%, normal RV size and systolic function.   Labs (1/15): K 4.3, creatinine 1.8, BNP 49 Labs (6/17): K 4.5, creatinine 1.14 Labs (9/17): K 4.5, Creatinine 1.38 Labs (10/12/16) LK 4.3, Creatinine 1.17  Review of systems complete and found to be negative unless listed in HPI.    Past Medical History:  Diagnosis Date  . AV block, 1st degree   . Bradycardia   . CAD (coronary artery disease)    Non ST elevation 2005 ; LHC in 6/05 in setting of NSTEMI: ant apical AK and inf-apical AK, EF 29%, dOM2 70-80% (small); findings c/w Tako-Tsubo CM  . Cardiomyopathy, nonischemic ([place]) 04/2008   Likely Tako-Tsubo. Resolved. --dx'd on cath 2005. EF 29% with minimal distal CAD (small OM1 70-80).  Echo 10/09 EF  55%;  echo 01/26/12: EF 55%, mild LAE, PASP 34.  . Colitis, ischemic ([place])   . Complication of anesthesia    post anesthesia excessive somnulence  . Compression fracture of C-spine ([place]) 10/10/2011   Fell at home, tx at [facility]  . Diarrhea associated with pseudomembranous colitis 6/11-17/2013   [facility]  . Dyslipidemia   . Fall    with non-healing rib fractures  . Glaucoma   . Granuloma annulare   . History of phlebitis   . Hypertension    SEVERE  . Idiopathic thrombocytopenic purpura (ITP)   . Lower extremity edema   . Lumbar stenosis 2004   DR. MARK ROY  . Multiple pelvic fractures 05/2012   Dr Adaline Sill , [facility]  . Osteoporosis     Current Outpatient Prescriptions  Medication Sig Dispense Refill  . benazepril (LOTENSIN) 20 MG tablet Take 1 tablet (20 mg total) by mouth daily. 90 tablet 1  . beta carotene w/minerals (OCUVITE) tablet Take 1 tablet by mouth daily.     . brimonidine (ALPHAGAN) 0.2 % ophthalmic solution Place 1 drop into the right eye 2 (two) times daily.      . Calcium Carbonate-Vit D-Min (CALCIUM 1200 PO) Take 1,200 mg by mouth daily.    . carboxymethylcellulose (REFRESH TEARS) 0.5 % SOLN Place 1 drop into both eyes 2 (two) times daily.     . carvedilol (  COREG) 12.5 MG tablet Take 0.5 tablets (6.25 mg total) by mouth 2 (two) times daily. 90 tablet 2  . Cholecalciferol (VITAMIN D3) 3000 UNITS TABS Take 1 tablet by mouth daily.    Marland Kitchen denosumab (PROLIA) 60 MG/ML SOLN injection Inject 60 mg into the skin every 6 (six) months. Administer in upper arm, thigh, or abdomen    . diphenhydrAMINE (BENADRYL) 25 MG tablet Take 25 mg by mouth at bedtime as needed for sleep (and sinus issues).     . docusate sodium (COLACE) 100 MG capsule Take 100 mg by mouth daily as needed for mild constipation. Stool softener     . furosemide (LASIX) 40 MG tablet Take 1 tablet (40 mg total) by mouth daily. 90 tablet 2  . ibuprofen (ADVIL,MOTRIN) 200 MG tablet Take 200 mg by mouth daily as  needed (for pain).     . Misc Natural Products (OSTEO BI-FLEX ADV JOINT SHIELD) TABS Take 1 tablet by mouth daily.     Marland Kitchen omeprazole (PRILOSEC) 20 MG capsule Take 20 mg by mouth daily.      . Rivaroxaban (XARELTO) 15 MG TABS tablet Take 1 tablet (15 mg total) by mouth daily with supper. 30 tablet 11  . sodium chloride (OCEAN) 0.65 % SOLN nasal spray Place 1-2 sprays into both nostrils every 4 (four) hours as needed for congestion.    . timolol (TIMOPTIC) 0.25 % ophthalmic solution Place 1 drop into the right eye 2 (two) times daily.    . vitamin B-12 (CYANOCOBALAMIN) 1000 MCG tablet Take 2,000 mcg by mouth daily.      No current facility-administered medications for this encounter.    PHYSICAL EXAM: Vitals:   03/19/17 1319  BP: (!) 152/100  Pulse: 100  SpO2: 96%  Weight: 175 lb 12.8 oz (79.7 kg)   Wt Readings from Last 3 Encounters:  03/19/17 175 lb 12.8 oz (79.7 kg)  02/10/17 178 lb 8 oz (81 kg)  02/09/17 178 lb 3.2 oz (80.8 kg)    General:  Elderly Well appearing. No resp difficulty HEENT: normal Neck: supple. JVP 6-7 Carotids 2+ bilat; no bruits. No lymphadenopathy or thryomegaly appreciated. Cor: PMI nondisplaced. Regular rate & rhythm. No rubs, gallops or murmurs. Lungs: clear Abdomen: soft, nontender, nondistended. No hepatosplenomegaly. No bruits or masses. Good bowel sounds. Extremities: no cyanosis, clubbing, rash, edema Neuro: alert & orientedx3, cranial nerves grossly intact. moves all 4 extremities w/o difficulty. Affect pleasant  ECG: NSR 97. LVH 1AVB 240ms No ST-T wave abnormalities.    ASSESSMENT & PLAN: 1. Chronic diastolic CHF: History of Takotsubo cardiomyopathy, EF has recovered. - Echo 08/14/13 EF 55-60% with normal RV.  - Volume status looks good. Continue lasix 40 mg daily. If weight 174 or greater take an extra 20 in the afternoon, - Reinforced fluid restriction to < 2 L daily, sodium restriction to less than 2000 mg daily, and the importance of daily  weights.   - Check BMET 2. CAD:  - Minimal branch vessel disease on cath  - No s/s ishemia  - Off ASA in setting of Xarelto. Has refused statins. 3. Paroxysmal Afib with RVR/Tachybrady syndrome s/p St Jude PPM 11/06/16 Regular pulse. This patients CHA2DS2-VASc Score and unadjusted Ischemic Stroke Rate (% per year) is equal to 9.7 % stroke rate/year from a score of 6  - In NSR today by ECG.  4.  HTN  -Home BP reviewed. Mildly elevatded but would not be too aggressive about pushing BP down at her age. Will  have her take extra benazepril if SBP >= 180    Glori Bickers, MD  03/19/2017

## 2017-04-05 ENCOUNTER — Encounter (HOSPITAL_COMMUNITY): Payer: Medicare Other | Admitting: Internal Medicine

## 2017-04-05 ENCOUNTER — Other Ambulatory Visit: Payer: Self-pay | Admitting: Internal Medicine

## 2017-04-06 DIAGNOSIS — K219 Gastro-esophageal reflux disease without esophagitis: Secondary | ICD-10-CM | POA: Diagnosis not present

## 2017-04-06 DIAGNOSIS — Z9181 History of falling: Secondary | ICD-10-CM | POA: Diagnosis not present

## 2017-04-06 DIAGNOSIS — M810 Age-related osteoporosis without current pathological fracture: Secondary | ICD-10-CM | POA: Diagnosis not present

## 2017-04-06 DIAGNOSIS — M791 Myalgia: Secondary | ICD-10-CM | POA: Diagnosis not present

## 2017-04-06 DIAGNOSIS — Z950 Presence of cardiac pacemaker: Secondary | ICD-10-CM | POA: Diagnosis not present

## 2017-04-09 DIAGNOSIS — Z950 Presence of cardiac pacemaker: Secondary | ICD-10-CM | POA: Diagnosis not present

## 2017-04-09 DIAGNOSIS — I252 Old myocardial infarction: Secondary | ICD-10-CM | POA: Diagnosis not present

## 2017-04-09 DIAGNOSIS — Z888 Allergy status to other drugs, medicaments and biological substances status: Secondary | ICD-10-CM | POA: Diagnosis not present

## 2017-04-09 DIAGNOSIS — Z881 Allergy status to other antibiotic agents status: Secondary | ICD-10-CM | POA: Diagnosis not present

## 2017-04-09 DIAGNOSIS — Z880 Allergy status to penicillin: Secondary | ICD-10-CM | POA: Diagnosis not present

## 2017-04-09 DIAGNOSIS — Z947 Corneal transplant status: Secondary | ICD-10-CM | POA: Diagnosis not present

## 2017-04-09 DIAGNOSIS — H35363 Drusen (degenerative) of macula, bilateral: Secondary | ICD-10-CM | POA: Diagnosis not present

## 2017-04-09 DIAGNOSIS — H353132 Nonexudative age-related macular degeneration, bilateral, intermediate dry stage: Secondary | ICD-10-CM | POA: Diagnosis not present

## 2017-04-09 DIAGNOSIS — Z961 Presence of intraocular lens: Secondary | ICD-10-CM | POA: Diagnosis not present

## 2017-04-09 DIAGNOSIS — Z885 Allergy status to narcotic agent status: Secondary | ICD-10-CM | POA: Diagnosis not present

## 2017-04-09 DIAGNOSIS — H02054 Trichiasis without entropian left upper eyelid: Secondary | ICD-10-CM | POA: Diagnosis not present

## 2017-04-09 DIAGNOSIS — Z9841 Cataract extraction status, right eye: Secondary | ICD-10-CM | POA: Diagnosis not present

## 2017-04-09 DIAGNOSIS — Z9842 Cataract extraction status, left eye: Secondary | ICD-10-CM | POA: Diagnosis not present

## 2017-04-09 DIAGNOSIS — H1851 Endothelial corneal dystrophy: Secondary | ICD-10-CM | POA: Diagnosis not present

## 2017-04-09 DIAGNOSIS — I10 Essential (primary) hypertension: Secondary | ICD-10-CM | POA: Diagnosis not present

## 2017-04-09 DIAGNOSIS — H01006 Unspecified blepharitis left eye, unspecified eyelid: Secondary | ICD-10-CM | POA: Diagnosis not present

## 2017-04-09 DIAGNOSIS — H01003 Unspecified blepharitis right eye, unspecified eyelid: Secondary | ICD-10-CM | POA: Diagnosis not present

## 2017-04-09 DIAGNOSIS — Z79899 Other long term (current) drug therapy: Secondary | ICD-10-CM | POA: Diagnosis not present

## 2017-04-09 DIAGNOSIS — H43813 Vitreous degeneration, bilateral: Secondary | ICD-10-CM | POA: Diagnosis not present

## 2017-05-11 ENCOUNTER — Ambulatory Visit (INDEPENDENT_AMBULATORY_CARE_PROVIDER_SITE_OTHER): Payer: Medicare Other | Admitting: *Deleted

## 2017-05-11 DIAGNOSIS — I442 Atrioventricular block, complete: Secondary | ICD-10-CM | POA: Diagnosis not present

## 2017-05-11 NOTE — Progress Notes (Signed)
Remote pacemaker transmission.   

## 2017-05-18 LAB — CUP PACEART REMOTE DEVICE CHECK
Battery Remaining Longevity: 117 mo
Battery Remaining Percentage: 95.5 %
Brady Statistic RV Percent Paced: 2.6 %
Implantable Lead Implant Date: 20180427
Implantable Lead Location: 753860
Implantable Pulse Generator Implant Date: 20180427
Lead Channel Impedance Value: 450 Ohm
Lead Channel Pacing Threshold Amplitude: 0.75 V
Lead Channel Pacing Threshold Pulse Width: 0.5 ms
Lead Channel Sensing Intrinsic Amplitude: 2.3 mV
Lead Channel Setting Pacing Amplitude: 2.5 V
Lead Channel Setting Pacing Pulse Width: 0.5 ms
MDC IDC LEAD IMPLANT DT: 20180427
MDC IDC LEAD LOCATION: 753859
MDC IDC MSMT BATTERY VOLTAGE: 3.01 V
MDC IDC MSMT LEADCHNL RA PACING THRESHOLD AMPLITUDE: 1 V
MDC IDC MSMT LEADCHNL RA PACING THRESHOLD PULSEWIDTH: 0.5 ms
MDC IDC MSMT LEADCHNL RV IMPEDANCE VALUE: 680 Ohm
MDC IDC MSMT LEADCHNL RV SENSING INTR AMPL: 12 mV
MDC IDC PG SERIAL: 8900487
MDC IDC SESS DTM: 20181030060015
MDC IDC SET LEADCHNL RA PACING AMPLITUDE: 2.5 V
MDC IDC SET LEADCHNL RV SENSING SENSITIVITY: 2 mV
MDC IDC STAT BRADY AP VP PERCENT: 1 %
MDC IDC STAT BRADY AP VS PERCENT: 2.1 %
MDC IDC STAT BRADY AS VP PERCENT: 1 %
MDC IDC STAT BRADY AS VS PERCENT: 97 %
MDC IDC STAT BRADY RA PERCENT PACED: 1.9 %

## 2017-05-19 ENCOUNTER — Encounter: Payer: Self-pay | Admitting: Cardiology

## 2017-06-14 ENCOUNTER — Ambulatory Visit (HOSPITAL_BASED_OUTPATIENT_CLINIC_OR_DEPARTMENT_OTHER)
Admission: RE | Admit: 2017-06-14 | Discharge: 2017-06-14 | Disposition: A | Discharge status: Home / Self Care | Payer: Medicare Other | Source: Ambulatory Visit | Attending: Internal Medicine | Admitting: Internal Medicine

## 2017-06-14 ENCOUNTER — Ambulatory Visit (INDEPENDENT_AMBULATORY_CARE_PROVIDER_SITE_OTHER): Payer: Medicare Other | Admitting: Internal Medicine

## 2017-06-14 ENCOUNTER — Encounter: Payer: Self-pay | Admitting: Internal Medicine

## 2017-06-14 VITALS — BP 122/70 | HR 74 | Temp 98.1°F | Resp 14 | Ht 64.0 in | Wt 177.2 lb

## 2017-06-14 DIAGNOSIS — R29898 Other symptoms and signs involving the musculoskeletal system: Secondary | ICD-10-CM | POA: Diagnosis not present

## 2017-06-14 DIAGNOSIS — Z23 Encounter for immunization: Secondary | ICD-10-CM | POA: Diagnosis not present

## 2017-06-14 DIAGNOSIS — M4854XA Collapsed vertebra, not elsewhere classified, thoracic region, initial encounter for fracture: Secondary | ICD-10-CM | POA: Insufficient documentation

## 2017-06-14 DIAGNOSIS — M549 Dorsalgia, unspecified: Secondary | ICD-10-CM | POA: Diagnosis not present

## 2017-06-14 DIAGNOSIS — M545 Low back pain: Secondary | ICD-10-CM | POA: Diagnosis not present

## 2017-06-14 DIAGNOSIS — I700 Atherosclerosis of aorta: Secondary | ICD-10-CM | POA: Insufficient documentation

## 2017-06-14 DIAGNOSIS — I2510 Atherosclerotic heart disease of native coronary artery without angina pectoris: Secondary | ICD-10-CM | POA: Diagnosis not present

## 2017-06-14 DIAGNOSIS — I5032 Chronic diastolic (congestive) heart failure: Secondary | ICD-10-CM | POA: Diagnosis not present

## 2017-06-14 DIAGNOSIS — E785 Hyperlipidemia, unspecified: Secondary | ICD-10-CM | POA: Diagnosis not present

## 2017-06-14 DIAGNOSIS — G8929 Other chronic pain: Secondary | ICD-10-CM | POA: Diagnosis not present

## 2017-06-14 DIAGNOSIS — D510 Vitamin B12 deficiency anemia due to intrinsic factor deficiency: Secondary | ICD-10-CM

## 2017-06-14 LAB — CBC WITH DIFFERENTIAL/PLATELET
BASOS ABS: 0 10*3/uL (ref 0.0–0.1)
Basophils Relative: 0.7 % (ref 0.0–3.0)
EOS ABS: 0.3 10*3/uL (ref 0.0–0.7)
Eosinophils Relative: 5.9 % — ABNORMAL HIGH (ref 0.0–5.0)
HCT: 35.6 % — ABNORMAL LOW (ref 36.0–46.0)
Hemoglobin: 12.1 g/dL (ref 12.0–15.0)
LYMPHS ABS: 1.5 10*3/uL (ref 0.7–4.0)
Lymphocytes Relative: 26.7 % (ref 12.0–46.0)
MCHC: 34.1 g/dL (ref 30.0–36.0)
MCV: 96.4 fl (ref 78.0–100.0)
MONO ABS: 0.4 10*3/uL (ref 0.1–1.0)
Monocytes Relative: 7.3 % (ref 3.0–12.0)
NEUTROS PCT: 59.4 % (ref 43.0–77.0)
Neutro Abs: 3.4 10*3/uL (ref 1.4–7.7)
Platelets: 61 10*3/uL — ABNORMAL LOW (ref 150.0–400.0)
RBC: 3.7 Mil/uL — AB (ref 3.87–5.11)
RDW: 14.3 % (ref 11.5–15.5)
WBC: 5.7 10*3/uL (ref 4.0–10.5)

## 2017-06-14 NOTE — Progress Notes (Signed)
Subjective:    Patient ID: [name], female    DOB: 04-24-1923, 82 y.o.   MRN: [id number]  DOS:  06/14/2017 Type of visit - description : f/u  Interval history: -Since the last visit here, saw cardiology, note reviewed. -Also complaining of chronic low back pain, worse for the last few weeks: No radiation, no lower extremity paresthesias, no fall.  Patient only takes Aleve with no much relief. -Also complaining of weakness, mostly on her legs when she tries to walk.  No actual leg pain ( no claudication) but after several steps needs to stop and rest. -HTN: Frequently check, usually 130/70.  Rarely higher or lower. -CHF: pt keeps an eye on her wt, it runs from 170 to 175 lb -Reports has  pain "deep in the arms and legs" sometimes, she points to the long bones, she wonders if it is related to Prolia.  Joints are not that affected.  Review of Systems Denies chest pain, occasionally has lower extremity edema but not consistently so. No orthopnea.  She does have DOE No chest pain No depression, still living independently and driving.   Past Medical History:  Diagnosis Date  . AV block, 1st degree   . Bradycardia   . CAD (coronary artery disease)    Non ST elevation 2005 ; LHC in 6/05 in setting of NSTEMI: ant apical AK and inf-apical AK, EF 29%, dOM2 70-80% (small); findings c/w Tako-Tsubo CM  . Cardiomyopathy, nonischemic ([place]) 04/2008   Likely Tako-Tsubo. Resolved. --dx'd on cath 2005. EF 29% with minimal distal CAD (small OM1 70-80).  Echo 10/09 EF 55%;  echo 01/26/12: EF 55%, mild LAE, PASP 34.  . Colitis, ischemic ([place])   . Complication of anesthesia    post anesthesia excessive somnulence  . Compression fracture of C-spine ([place]) 10/10/2011   Fell at home, tx at [facility]  . Diarrhea associated with pseudomembranous colitis 6/11-17/2013   [facility]  . Dyslipidemia   . Fall    with non-healing rib fractures  . Glaucoma   . Granuloma annulare   . History of phlebitis    . Hypertension    SEVERE  . Idiopathic thrombocytopenic purpura (ITP)   . Lower extremity edema   . Lumbar stenosis 2004   DR. MARK ROY  . Multiple pelvic fractures 05/2012   Dr Adaline Sill , [facility]  . Osteoporosis     Past Surgical History:  Procedure Laterality Date  . ABDOMINAL HYSTERECTOMY    . APPENDECTOMY    . BACK SURGERY  10/2004   LS Disc 4-5 no sx.  Marland Kitchen CARDIAC CATHETERIZATION     12/2003  . CATARACT EXTRACTION, BILATERAL    . COLONOSCOPY  09/2005   adenoma  . COLONOSCOPY W/ POLYPECTOMY    . 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 PACEMAKER IMPLANT N/A 11/06/2016   Procedure: Pacemaker Implant;  Surgeon: Evans Lance, MD;  Location: [place] [facility];  Service: Cardiovascular;  Laterality: N/A;  . rectal bleed  05-01-06   colonoscopy-ischemic colitis  . ROTATOR CUFF REPAIR    . VERTEBROPLASTY     Dr Gladstone Lighter    Social History   Socioeconomic History  . Marital status: Widowed    Spouse name: Not on file  . Number of children: 1  . Years of education: Not on file  . Highest education level: Not on file  Social Needs  . Financial resource strain: Not on file  . Food insecurity - worry:  Not on file  . Food insecurity - inability: Not on file  . Transportation needs - medical: Not on file  . Transportation needs - non-medical: Not on file  Occupational History  . Occupation: retired    Fish farm manager: RETIRED  Tobacco Use  . Smoking status: Never Smoker  . Smokeless tobacco: Never Used  Substance and Sexual Activity  . Alcohol use: No  . Drug use: No  . Sexual activity: Not on file  Other Topics Concern  . Not on file  Social History Narrative   Widowed   Lives by herself in a town house, drives    Son [name]       Allergies as of 06/14/2017      Reactions   Morphine And Related Anaphylaxis, Nausea Only   Per family violently ill   Oxycodone Other (See Comments)   Dizziness    Penicillins    Joint edema Has patient had a PCN reaction  causing immediate rash, facial/tongue/throat swelling, SOB or lightheadedness with hypotension: Yes Has patient had a PCN reaction causing severe rash involving mucus membranes or skin necrosis: No Has patient had a PCN reaction that required hospitalization: No Has patient had a PCN reaction occurring within the last 10 years: No If all of the above answers are "NO", then may proceed with Cephalosporin use.   Colchicine Other (See Comments)   Hair loss   Hydroxychloroquine Other (See Comments)   Unknown reaction   Norvasc [amlodipine Besylate]    Reaction not recalled by the patient   Ofloxacin Other (See Comments)   Unknown   Streptomycin Other (See Comments)   Unknown   Tramadol Itching   Alendronate Sodium Palpitations      Medication List        Accurate as of 06/14/17 11:59 PM. Always use your most recent med list.          benazepril 20 MG tablet Commonly known as:  LOTENSIN Take 1 tablet (20 mg total) by mouth daily. Take an extra 20 mg (1 tab) if your systolic (top number) of Blood Pressure is 180 or higher.   beta carotene w/minerals tablet Take 1 tablet by mouth daily.   brimonidine 0.2 % ophthalmic solution Commonly known as:  ALPHAGAN Place 1 drop into the right eye 2 (two) times daily.   CALCIUM 1200 PO Take 1,200 mg by mouth daily.   carvedilol 12.5 MG tablet Commonly known as:  COREG Take 0.5 tablets (6.25 mg total) by mouth 2 (two) times daily.   denosumab 60 MG/ML Soln injection Commonly known as:  PROLIA Inject 60 mg into the skin every 6 (six) months. Administer in upper arm, thigh, or abdomen   diphenhydrAMINE 25 MG tablet Commonly known as:  BENADRYL Take 25 mg by mouth at bedtime as needed for sleep (and sinus issues).   docusate sodium 100 MG capsule Commonly known as:  COLACE Take 100 mg by mouth daily as needed for mild constipation. Stool softener   furosemide 40 MG tablet Commonly known as:  LASIX Take 1 tablet (40 mg total) by  mouth daily. Take an additional 20 mg (1/2 tab) if weight is 174 or more   ibuprofen 200 MG tablet Commonly known as:  ADVIL,MOTRIN Take 200 mg by mouth daily as needed (for pain).   omeprazole 20 MG capsule Commonly known as:  PRILOSEC Take 20 mg by mouth daily.   OSTEO BI-FLEX ADV JOINT SHIELD Tabs Take 1 tablet by mouth daily.  REFRESH TEARS 0.5 % Soln Generic drug:  carboxymethylcellulose Place 1 drop into both eyes 2 (two) times daily.   Rivaroxaban 15 MG Tabs tablet Commonly known as:  XARELTO Take 1 tablet (15 mg total) by mouth daily with supper.   sodium chloride 0.65 % Soln nasal spray Commonly known as:  OCEAN Place 1-2 sprays into both nostrils every 4 (four) hours as needed for congestion.   timolol 0.25 % ophthalmic solution Commonly known as:  TIMOPTIC Place 1 drop into the right eye 2 (two) times daily.   vitamin B-12 1000 MCG tablet Commonly known as:  CYANOCOBALAMIN Take 2,000 mcg by mouth daily.   Vitamin D3 3000 units Tabs Take 1 tablet by mouth daily.          Objective:   Physical Exam  Musculoskeletal:       Back:   BP 122/70 (BP Location: Left Arm, Patient Position: Sitting, Cuff Size: Small)   Pulse 74   Temp 98.1 F (36.7 C) (Oral)   Resp 14   Ht _0  (1.626 m)   Wt 177 lb 4 oz (80.4 kg)   SpO2 93%   BMI 30.42 kg/m  General:   Well developed, completely lady, no acute distress HEENT:  Normocephalic . Face symmetric, atraumatic Neck: JVD around 8 cm.  At 45 degrees. Lungs:  CTA B Normal respiratory effort, no intercostal retractions, no accessory muscle use. Heart: Regular?,  no murmur.  no pretibial edema bilaterally with normal pedal pulses Abdomen:  Not distended, soft, non-tender. No rebound or rigidity.   Skin: Not pale. Not jaundice Neurologic:  alert & oriented X3.  Speech normal, gait assisted by a cane, walks slow, not far from baseline. Psych--  Cognition and judgment appear intact.  Cooperative with normal  attention span and concentration.  Behavior appropriate. No anxious or depressed appearing.     Assessment & Plan:   Assessment   HTN severe CKD: creat ~1.4, 1.5 Dyslipidemia Hematology: --ITP-- hematology visit for 11-05-14, released to the care of PCP , check CBCs every 4 months, consult hematology if platelets <50 K --Pernicious anemia --Anemia of chronic disease CV: --CAD --Tako Tsubo cardiomyopathy --Diastolic  CHF, resolved, Dr Missy Sabins --AV block first-degree ; Paroxysmal Afib with RVR/Tachybrady syndrome s/p admission: implanted aSt Jude PPM 11/06/16 DJD: --Compression fractures --PROLIA: Rx elsewhere --Lumbar stenosis Dr. Carloyn Manner --Multiple pelvic fractures , cervical spine compression  fracture 2013 [facility] --H/o nonhealing rib  Osteoporosis: Dr. Agapito Games; s/p forteo, s/p  Prolia 09-2015. Last DEXA 09-29-16 OPHT: --Glaucoma  --S/p cornea transplant -- mac degeneration h/o granuloma annulare   H/o pseudomembranous colitis 2013  PLAN: HTN: Seems controlled CHF, arrhythmias, CAD: Seen by cardiology 03-2017, felt to be stable.  No objective evidence of volume overload on exam today.  Check CMP. Low back pain (acute on chronic) and also "bone pain" at extremities: Chronic back pain worse for the last 3 weeks, she has a history of pelvic fractures, osteoporosis on Prolia, lumbar spine stenosis.  We will get a plain x-ray to rule out a new fractures of the lumbar spine.  The CMP will include alkaline phosphate level, check a sed rate. Rec Tylenol prn, warm compress, call if not improving. Leg weakness: As described above, likely multifactorial, she has good pedal pulses ; no pain in the lower extremities with ambulation.  Offered PT (she will call w/ the name of the facility she likes to be referred to)  High cholesterol:  Refused meds before, won't check FLP  pernicious anemia: Check a CBC RTC 3-4 months

## 2017-06-14 NOTE — Progress Notes (Signed)
Pre visit review using our clinic review tool, if applicable. No additional management support is needed unless otherwise documented below in the visit note. 

## 2017-06-14 NOTE — Patient Instructions (Signed)
GO TO THE LAB : Get the blood work     GO TO THE FRONT DESK Schedule your next appointment for a checkup in 3-4 months     STOP BY THE FIRST FLOOR:  get the XR    For pain:  Tylenol  500 mg OTC 2 tabs a day every 8 hours as needed for pain  Warm compress   Call if not gradually improving

## 2017-06-15 ENCOUNTER — Other Ambulatory Visit (INDEPENDENT_AMBULATORY_CARE_PROVIDER_SITE_OTHER): Payer: Medicare Other

## 2017-06-15 DIAGNOSIS — M79609 Pain in unspecified limb: Secondary | ICD-10-CM | POA: Diagnosis not present

## 2017-06-15 LAB — COMPREHENSIVE METABOLIC PANEL
ALK PHOS: 70 U/L (ref 39–117)
ALT: 9 U/L (ref 0–35)
AST: 16 U/L (ref 0–37)
Albumin: 4 g/dL (ref 3.5–5.2)
BILIRUBIN TOTAL: 0.7 mg/dL (ref 0.2–1.2)
BUN: 25 mg/dL — ABNORMAL HIGH (ref 6–23)
CO2: 25 mEq/L (ref 19–32)
CREATININE: 1.47 mg/dL — AB (ref 0.40–1.20)
Calcium: 9.4 mg/dL (ref 8.4–10.5)
Chloride: 107 mEq/L (ref 96–112)
GFR: 35.28 mL/min — AB (ref >60.00)
GLUCOSE: 106 mg/dL — AB (ref 70–99)
Potassium: 4.2 mEq/L (ref 3.5–5.1)
Sodium: 142 mEq/L (ref 135–145)
TOTAL PROTEIN: 6.3 g/dL (ref 6.0–8.3)

## 2017-06-15 LAB — SEDIMENTATION RATE: SED RATE: 6 mm/h (ref 0–30)

## 2017-06-15 NOTE — Assessment & Plan Note (Signed)
HTN: Seems controlled CHF, arrhythmias, CAD: Seen by cardiology 03-2017, felt to be stable.  No objective evidence of volume overload on exam today.  Check CMP. Low back pain (acute on chronic) and also "bone pain" at extremities: Chronic back pain worse for the last 3 weeks, she has a history of pelvic fractures, osteoporosis on Prolia, lumbar spine stenosis.  We will get a plain x-ray to rule out a new fractures of the lumbar spine.  The CMP will include alkaline phosphate level, check a sed rate. Rec Tylenol prn, warm compress, call if not improving. Leg weakness: As described above, likely multifactorial, she has good pedal pulses ; no pain in the lower extremities with ambulation.  Offered PT (she will call w/ the name of the facility she likes to be referred to)  High cholesterol:  Refused meds before, won't check FLP pernicious anemia: Check a CBC RTC 3-4 months

## 2017-06-17 NOTE — Addendum Note (Signed)
Addended byDamita Dunnings D on: 06/17/2017 09:53 AM   Modules accepted: Orders

## 2017-07-14 DIAGNOSIS — M545 Low back pain: Secondary | ICD-10-CM | POA: Diagnosis not present

## 2017-07-14 DIAGNOSIS — R262 Difficulty in walking, not elsewhere classified: Secondary | ICD-10-CM | POA: Diagnosis not present

## 2017-07-14 DIAGNOSIS — M25561 Pain in right knee: Secondary | ICD-10-CM | POA: Diagnosis not present

## 2017-07-14 DIAGNOSIS — M6281 Muscle weakness (generalized): Secondary | ICD-10-CM | POA: Diagnosis not present

## 2017-07-15 NOTE — Progress Notes (Deleted)
Subjective:   [name] is a 84 y.o. female who presents for Medicare Annual (Subsequent) preventive examination.  Review of Systems:  No ROS.  Medicare Wellness Visit. Additional risk factors are reflected in the social history.   Sleep patterns:  Home Safety/Smoke Alarms: Feels safe in home. Smoke alarms in place.   Female:      Mammo- No longer doing routine screening due to age per pt    Dexa scan- last 09/11/14. No result on file.           Objective:     Vitals: There were no vitals taken for this visit.  There is no height or weight on file to calculate BMI.  Advanced Directives 01/26/2017 11/07/2016 07/10/2016 10/28/2015 09/24/2015 09/23/2015 11/05/2014  Does Patient Have a Medical Advance Directive? No Yes;No Yes Yes Yes Yes No  Type of Advance Directive - Public librarian;Living will [place];Living will - - -  Does patient want to make changes to medical advance directive? - - Yes (MAU/Ambulatory/Procedural Areas - Information given) - - - -  Copy of [place] in Chart? - - No - copy requested No - copy requested - - -  Would patient like information on creating a medical advance directive? - No - Patient declined - - - - No - patient declined information    Tobacco Social History   Tobacco Use  Smoking Status Never Smoker  Smokeless Tobacco Never Used     Counseling given: Not Answered   Clinical Intake:                       Past Medical History:  Diagnosis Date  . AV block, 1st degree   . Bradycardia   . CAD (coronary artery disease)    Non ST elevation 2005 ; LHC in 6/05 in setting of NSTEMI: ant apical AK and inf-apical AK, EF 29%, dOM2 70-80% (small); findings c/w Tako-Tsubo CM  . Cardiomyopathy, nonischemic ([place]) 04/2008   Likely Tako-Tsubo. Resolved. --dx'd on cath 2005. EF 29% with minimal distal CAD (small OM1 70-80).  Echo 10/09 EF 55%;  echo 01/26/12: EF 55%, mild LAE, PASP 34.    . Colitis, ischemic ([place])   . Complication of anesthesia    post anesthesia excessive somnulence  . Compression fracture of C-spine ([place]) 10/10/2011   Fell at home, tx at [facility]  . Diarrhea associated with pseudomembranous colitis 6/11-17/2013   [facility]  . Dyslipidemia   . Fall    with non-healing rib fractures  . Glaucoma   . Granuloma annulare   . History of phlebitis   . Hypertension    SEVERE  . Idiopathic thrombocytopenic purpura (ITP)   . Lower extremity edema   . Lumbar stenosis 2004   DR. MARK ROY  . Multiple pelvic fractures 05/2012   Dr Adaline Sill , [facility]  . Osteoporosis    Past Surgical History:  Procedure Laterality Date  . ABDOMINAL HYSTERECTOMY    . APPENDECTOMY    . BACK SURGERY  10/2004   LS Disc 4-5 no sx.  Marland Kitchen CARDIAC CATHETERIZATION     12/2003  . CATARACT EXTRACTION, BILATERAL    . COLONOSCOPY  09/2005   adenoma  . COLONOSCOPY W/ POLYPECTOMY    . 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 PACEMAKER IMPLANT N/A 11/06/2016   Procedure: Pacemaker Implant;  Surgeon: Evans Lance, MD;  Location: [place]  [facility];  Service: Cardiovascular;  Laterality: N/A;  . rectal bleed  05-01-06   colonoscopy-ischemic colitis  . ROTATOR CUFF REPAIR    . VERTEBROPLASTY     Dr Gladstone Lighter   Family History  Problem Relation Age of Onset  . Coronary artery disease Mother   . Heart failure Mother   . Renal cancer Mother   . Kidney disease Mother   . Stroke Father        in his 17s  . Colon cancer Father   . Breast cancer Sister         X 2  . Rectal cancer Sister   . Breast cancer Sister   . Diabetes Neg Hx    Social History   Socioeconomic History  . Marital status: Widowed    Spouse name: Not on file  . Number of children: 1  . Years of education: Not on file  . Highest education level: Not on file  Social Needs  . Financial resource strain: Not on file  . Food insecurity - worry: Not on file  . Food insecurity - inability: Not on file  .  Transportation needs - medical: Not on file  . Transportation needs - non-medical: Not on file  Occupational History  . Occupation: retired    Fish farm manager: RETIRED  Tobacco Use  . Smoking status: Never Smoker  . Smokeless tobacco: Never Used  Substance and Sexual Activity  . Alcohol use: No  . Drug use: No  . Sexual activity: Not on file  Other Topics Concern  . Not on file  Social History Narrative   Widowed   Lives by herself in a town house, drives    Son Hayleigh Bawa     Outpatient Encounter Medications as of 07/19/2017  Medication Sig  . benazepril (LOTENSIN) 20 MG tablet Take 1 tablet (20 mg total) by mouth daily. Take an extra 20 mg (1 tab) if your systolic (top number) of Blood Pressure is 180 or higher.  . beta carotene w/minerals (OCUVITE) tablet Take 1 tablet by mouth daily.   . brimonidine (ALPHAGAN) 0.2 % ophthalmic solution Place 1 drop into the right eye 2 (two) times daily.    . Calcium Carbonate-Vit D-Min (CALCIUM 1200 PO) Take 1,200 mg by mouth daily.  . carboxymethylcellulose (REFRESH TEARS) 0.5 % SOLN Place 1 drop into both eyes 2 (two) times daily.   . carvedilol (COREG) 12.5 MG tablet Take 0.5 tablets (6.25 mg total) by mouth 2 (two) times daily.  . Cholecalciferol (VITAMIN D3) 3000 UNITS TABS Take 1 tablet by mouth daily.  Marland Kitchen denosumab (PROLIA) 60 MG/ML SOLN injection Inject 60 mg into the skin every 6 (six) months. Administer in upper arm, thigh, or abdomen  . diphenhydrAMINE (BENADRYL) 25 MG tablet Take 25 mg by mouth at bedtime as needed for sleep (and sinus issues).   . docusate sodium (COLACE) 100 MG capsule Take 100 mg by mouth daily as needed for mild constipation. Stool softener   . furosemide (LASIX) 40 MG tablet Take 1 tablet (40 mg total) by mouth daily. Take an additional 20 mg (1/2 tab) if weight is 174 or more  . ibuprofen (ADVIL,MOTRIN) 200 MG tablet Take 200 mg by mouth daily as needed (for pain).   . Misc Natural Products (OSTEO BI-FLEX ADV JOINT  SHIELD) TABS Take 1 tablet by mouth daily.   Marland Kitchen omeprazole (PRILOSEC) 20 MG capsule Take 20 mg by mouth daily.    . Rivaroxaban (XARELTO) 15 MG TABS  tablet Take 1 tablet (15 mg total) by mouth daily with supper.  . sodium chloride (OCEAN) 0.65 % SOLN nasal spray Place 1-2 sprays into both nostrils every 4 (four) hours as needed for congestion.  . timolol (TIMOPTIC) 0.25 % ophthalmic solution Place 1 drop into the right eye 2 (two) times daily.  . vitamin B-12 (CYANOCOBALAMIN) 1000 MCG tablet Take 2,000 mcg by mouth daily.    No facility-administered encounter medications on file as of 07/19/2017.     Activities of Daily Living In your present state of health, do you have any difficulty performing the following activities: 11/07/2016  Hearing? N  Vision? N  Difficulty concentrating or making decisions? N  Walking or climbing stairs? N  Dressing or bathing? N  Doing errands, shopping? N  Some recent data might be hidden    Patient Care Team: Colon Branch, MD as PCP - General (Internal Medicine) Bensimhon, Shaune Pascal, MD as Attending Physician (Cardiology) Bobette Mo, MD as Referring Physician (Ophthalmology) Sandria Senter, FNP as Referring Physician (Orthopedic Surgery)    Assessment:   This is a routine wellness examination for Gerlean. Physical assessment deferred to PCP.  Exercise Activities and Dietary recommendations   Diet (meal preparation, eat out, water intake, caffeinated beverages, dairy products, fruits and vegetables): {Desc; diets:16563} Breakfast: Lunch:  Dinner:      Goals    None      Fall Risk Fall Risk  07/10/2016 01/06/2016 10/28/2015 04/09/2015 12/07/2014  Falls in the past year? No No No No No   Depression Screen PHQ 2/9 Scores 07/10/2016 01/06/2016 10/28/2015 04/09/2015  PHQ - 2 Score 0 0 0 0     Cognitive Function MMSE - Mini Mental State Exam 07/10/2016  Orientation to time 5  Orientation to Place 5  Registration 3  Attention/ Calculation 5    Recall 3  Language- name 2 objects 2  Language- repeat 1  Language- follow 3 step command 3  Language- read & follow direction 1  Write a sentence 1  Copy design 1  Total score 30        Immunization History  Administered Date(s) Administered  . Influenza Split 04/12/2012, 03/05/2015  . Influenza Whole 05/04/1997, 03/13/2010  . Influenza, High Dose Seasonal PF 05/11/2016, 06/14/2017  . Influenza-Unspecified 03/13/2014  . Pneumococcal Conjugate-13 12/07/2014  . Pneumococcal Polysaccharide-23 12/14/2007  . Td 07/14/2011  . Tdap 10/10/2011   Screening Tests Health Maintenance  Topic Date Due  . TETANUS/TDAP  10/09/2021  . INFLUENZA VACCINE  Completed  . DEXA SCAN  Completed  . PNA vac Low Risk Adult  Completed      Plan:   ***   I have personally reviewed and noted the following in the patient's chart:   . Medical and social history . Use of alcohol, tobacco or illicit drugs  . Current medications and supplements . Functional ability and status . Nutritional status . Physical activity . Advanced directives . List of other physicians . Hospitalizations, surgeries, and ER visits in previous 12 months . Vitals . Screenings to include cognitive, depression, and falls . Referrals and appointments  In addition, I have reviewed and discussed with patient certain preventive protocols, quality metrics, and best practice recommendations. A written personalized care plan for preventive services as well as general preventive health recommendations were provided to patient.     Shela Nevin, [place]  07/15/2017

## 2017-07-19 ENCOUNTER — Ambulatory Visit (INDEPENDENT_AMBULATORY_CARE_PROVIDER_SITE_OTHER): Payer: Medicare Other | Admitting: Internal Medicine

## 2017-07-19 ENCOUNTER — Encounter: Payer: Self-pay | Admitting: Internal Medicine

## 2017-07-19 ENCOUNTER — Ambulatory Visit: Payer: Medicare Other | Admitting: *Deleted

## 2017-07-19 VITALS — BP 142/98 | HR 102 | Temp 98.3°F | Ht 64.0 in | Wt 175.0 lb

## 2017-07-19 DIAGNOSIS — R05 Cough: Secondary | ICD-10-CM

## 2017-07-19 DIAGNOSIS — R059 Cough, unspecified: Secondary | ICD-10-CM

## 2017-07-19 MED ORDER — AZITHROMYCIN 250 MG PO TABS: ORAL_TABLET | ORAL | 0 refills | Status: DC

## 2017-07-19 MED ORDER — ALBUTEROL SULFATE HFA 108 (90 BASE) MCG/ACT IN AERS: 2.0000 | INHALATION_SPRAY | Freq: Four times a day (QID) | RESPIRATORY_TRACT | 1 refills | Status: DC | PRN

## 2017-07-19 NOTE — Progress Notes (Signed)
Subjective:    Patient ID: [name], female    DOB: 1922-11-30, 84 y.o.   MRN: [id number]  DOS:  07/19/2017 Type of visit - description :  acute Interval history: Symptoms started 07/16/2017: Malaise, cough, the next day she developed steady left-sided upper chest pain described as pressure steady, not worse with movements or breathing.  After 24 hours the pain went away and is currently gone.  + sputum production, not rusty or bloody, slightly yellow. Eyes nose and throat are burning. Taking Mucinex DM with some help. Overall, today feels better than yesterday.   Review of Systems No fever or chills.  Was slightly short of breath today after the onset of symptoms.  Some wheezing. No nausea, vomiting, diarrhea  Past Medical History:  Diagnosis Date  . AV block, 1st degree   . Bradycardia   . CAD (coronary artery disease)    Non ST elevation 2005 ; LHC in 6/05 in setting of NSTEMI: ant apical AK and inf-apical AK, EF 29%, dOM2 70-80% (small); findings c/w Tako-Tsubo CM  . Cardiomyopathy, nonischemic ([place]) 04/2008   Likely Tako-Tsubo. Resolved. --dx'd on cath 2005. EF 29% with minimal distal CAD (small OM1 70-80).  Echo 10/09 EF 55%;  echo 01/26/12: EF 55%, mild LAE, PASP 34.  . Colitis, ischemic ([place])   . Complication of anesthesia    post anesthesia excessive somnulence  . Compression fracture of C-spine ([place]) 10/10/2011   Fell at home, tx at [facility]  . Diarrhea associated with pseudomembranous colitis 6/11-17/2013   [facility]  . Dyslipidemia   . Fall    with non-healing rib fractures  . Glaucoma   . Granuloma annulare   . History of phlebitis   . Hypertension    SEVERE  . Idiopathic thrombocytopenic purpura (ITP)   . Lower extremity edema   . Lumbar stenosis 2004   DR. MARK ROY  . Multiple pelvic fractures 05/2012   Dr Adaline Sill , [facility]  . Osteoporosis     Past Surgical History:  Procedure Laterality Date  . ABDOMINAL HYSTERECTOMY    . APPENDECTOMY    .  BACK SURGERY  10/2004   LS Disc 4-5 no sx.  Marland Kitchen CARDIAC CATHETERIZATION     12/2003  . CATARACT EXTRACTION, BILATERAL    . COLONOSCOPY  09/2005   adenoma  . COLONOSCOPY W/ POLYPECTOMY    . 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 PACEMAKER IMPLANT N/A 11/06/2016   Procedure: Pacemaker Implant;  Surgeon: Evans Lance, MD;  Location: [place] [facility];  Service: Cardiovascular;  Laterality: N/A;  . rectal bleed  05-01-06   colonoscopy-ischemic colitis  . ROTATOR CUFF REPAIR    . VERTEBROPLASTY     Dr Gladstone Lighter    Social History   Socioeconomic History  . Marital status: Widowed    Spouse name: Not on file  . Number of children: 1  . Years of education: Not on file  . Highest education level: Not on file  Social Needs  . Financial resource strain: Not on file  . Food insecurity - worry: Not on file  . Food insecurity - inability: Not on file  . Transportation needs - medical: Not on file  . Transportation needs - non-medical: Not on file  Occupational History  . Occupation: retired    Fish farm manager: RETIRED  Tobacco Use  . Smoking status: Never Smoker  . Smokeless tobacco: Never Used  Substance and Sexual Activity  . Alcohol use:  No  . Drug use: No  . Sexual activity: Not on file  Other Topics Concern  . Not on file  Social History Narrative   Widowed   Lives by herself in a town house, drives    Son Freedom Lopezperez       Allergies as of 07/19/2017      Reactions   Morphine And Related Anaphylaxis, Nausea Only   Per family violently ill   Oxycodone Other (See Comments)   Dizziness    Penicillins    Joint edema Has patient had a PCN reaction causing immediate rash, facial/tongue/throat swelling, SOB or lightheadedness with hypotension: Yes Has patient had a PCN reaction causing severe rash involving mucus membranes or skin necrosis: No Has patient had a PCN reaction that required hospitalization: No Has patient had a PCN reaction occurring within the last 10 years: No If  all of the above answers are "NO", then may proceed with Cephalosporin use.   Colchicine Other (See Comments)   Hair loss   Hydroxychloroquine Other (See Comments)   Unknown reaction   Norvasc [amlodipine Besylate]    Reaction not recalled by the patient   Ofloxacin Other (See Comments)   Unknown   Streptomycin Other (See Comments)   Unknown   Tramadol Itching   Alendronate Sodium Palpitations      Medication List        Accurate as of 07/19/17 11:59 PM. Always use your most recent med list.          albuterol 108 (90 Base) MCG/ACT inhaler Commonly known as:  VENTOLIN HFA Inhale 2 puffs into the lungs every 6 (six) hours as needed for wheezing or shortness of breath.   azithromycin 250 MG tablet Commonly known as:  ZITHROMAX Z-PAK 2 tabs a day the first day, then 1 tab a day x 4 days   benazepril 20 MG tablet Commonly known as:  LOTENSIN Take 1 tablet (20 mg total) by mouth daily. Take an extra 20 mg (1 tab) if your systolic (top number) of Blood Pressure is 180 or higher.   beta carotene w/minerals tablet Take 1 tablet by mouth daily.   brimonidine 0.2 % ophthalmic solution Commonly known as:  ALPHAGAN Place 1 drop into the right eye 2 (two) times daily.   CALCIUM 1200 PO Take 1,200 mg by mouth daily.   carvedilol 12.5 MG tablet Commonly known as:  COREG Take 0.5 tablets (6.25 mg total) by mouth 2 (two) times daily.   denosumab 60 MG/ML Soln injection Commonly known as:  PROLIA Inject 60 mg into the skin every 6 (six) months. Administer in upper arm, thigh, or abdomen   diphenhydrAMINE 25 MG tablet Commonly known as:  BENADRYL Take 25 mg by mouth at bedtime as needed for sleep (and sinus issues).   docusate sodium 100 MG capsule Commonly known as:  COLACE Take 100 mg by mouth daily as needed for mild constipation. Stool softener   furosemide 40 MG tablet Commonly known as:  LASIX Take 1 tablet (40 mg total) by mouth daily. Take an additional 20 mg (1/2  tab) if weight is 174 or more   omeprazole 20 MG capsule Commonly known as:  PRILOSEC Take 20 mg by mouth daily.   OSTEO BI-FLEX ADV JOINT SHIELD Tabs Take 1 tablet by mouth daily.   REFRESH TEARS 0.5 % Soln Generic drug:  carboxymethylcellulose Place 1 drop into both eyes 2 (two) times daily.   Rivaroxaban 15 MG Tabs tablet Commonly known  as:  XARELTO Take 1 tablet (15 mg total) by mouth daily with supper.   sodium chloride 0.65 % Soln nasal spray Commonly known as:  OCEAN Place 1-2 sprays into both nostrils every 4 (four) hours as needed for congestion.   timolol 0.25 % ophthalmic solution Commonly known as:  TIMOPTIC Place 1 drop into the right eye 2 (two) times daily.   vitamin B-12 1000 MCG tablet Commonly known as:  CYANOCOBALAMIN Take 2,000 mcg by mouth daily.   Vitamin D3 3000 units Tabs Take 1 tablet by mouth daily.          Objective:   Physical Exam BP (!) 142/98 (BP Location: Left Arm, Patient Position: Sitting, Cuff Size: Small)   Pulse (!) 102   Temp 98.3 F (36.8 C) (Oral)   Ht _0  (1.626 m)   Wt 175 lb (79.4 kg)   SpO2 90%   BMI 30.04 kg/m  General:   Well developed, well nourished, some cough noted, she does not look acutely ill or toxic.Marland Kitchen  HEENT:  Normocephalic . Face symmetric, atraumatic. TMs normal, throat symmetric and not red, nose not congested Lungs:  Few rhonchi and end expiratory wheezing.  No increased work of breathing. Normal respiratory effort, no intercostal retractions, no accessory muscle use. Heart: Slightly tachycardic .  No pretibial edema bilaterally  Skin: Not pale. Not jaundice Neurologic:  alert & oriented X3.  Speech normal, gait appropriate for age and unassisted Psych--  Cognition and judgment appear intact.  Cooperative with normal attention span and concentration.  Behavior appropriate. No anxious or depressed appearing.      Assessment & Plan:   Assessment   HTN severe CKD: creat ~1.4,  1.5 Dyslipidemia Hematology: --ITP-- hematology visit for 11-05-14, released to the care of PCP , check CBCs every 4 months, consult hematology if platelets <50 K --Pernicious anemia --Anemia of chronic disease CV: --CAD --Tako Tsubo cardiomyopathy --Diastolic  CHF, resolved, Dr Missy Sabins --AV block first-degree ; Paroxysmal Afib with RVR/Tachybrady syndrome s/p admission: implanted aSt Jude PPM 11/06/16 DJD: --Compression fractures --Lumbar stenosis Dr. Carloyn Manner --Multiple pelvic fractures , cervical spine compression  fracture 2013 [facility] --H/o nonhealing rib  --Osteoporosis: Dr. Agapito Games; s/p forteo, s/p  Prolia 09-2015. Last DEXA 09-29-16 OPHT: --Glaucoma  --S/p cornea transplant -- mac degeneration h/o granuloma annulare   H/o pseudomembranous colitis 2013  PLAN: Cough: Respiratory sxs  for the last 3 days, suspect bronchitis although pneumonia is in the differential.  Chest pain is always concerning however in the context of respiratory symptoms is not likely cardiac. CP is actually resolved. Also, BP was initially elevated but we recheck it: 142/98. O2 sat was checked 3 times, after she move around the room, her O2 sat decreased to 90, otherwise has been 97% and 98%. Plan: Chest x-ray, Zithromax, continue Mucinex, albuterol.  Discussed how to use an inhaler.  Strongly advised patient, low threshold to go to the ER if she is no quit improving.

## 2017-07-19 NOTE — Patient Instructions (Signed)
Get a chest x-ray at the first floor ======  Rest, fluids , tylenol  For cough:  Take Mucinex DM twice a day as needed until better  If you have a lot of cough or wheezing: Use albuterol 2 puffs every 6 hours as  needed  Avoid decongestants such as  Pseudoephedrine or phenylephrine     Take the antibiotic as prescribed  (Zithromax)  Call if not gradually better over the next  10 days  Call anytime if the symptoms are severe, the chest pain came back, you feel very short of breath.

## 2017-07-20 ENCOUNTER — Ambulatory Visit (HOSPITAL_BASED_OUTPATIENT_CLINIC_OR_DEPARTMENT_OTHER)
Admission: RE | Admit: 2017-07-20 | Discharge: 2017-07-20 | Disposition: A | Discharge status: Home / Self Care | Payer: Medicare Other | Source: Ambulatory Visit | Attending: Internal Medicine | Admitting: Internal Medicine

## 2017-07-20 DIAGNOSIS — J984 Other disorders of lung: Secondary | ICD-10-CM | POA: Insufficient documentation

## 2017-07-20 DIAGNOSIS — J9811 Atelectasis: Secondary | ICD-10-CM | POA: Diagnosis not present

## 2017-07-20 DIAGNOSIS — R05 Cough: Secondary | ICD-10-CM | POA: Diagnosis not present

## 2017-07-20 NOTE — Assessment & Plan Note (Signed)
Cough: Respiratory sxs  for the last 3 days, suspect bronchitis although pneumonia is in the differential.  Chest pain is always concerning however in the context of respiratory symptoms is not likely cardiac. CP is actually resolved. Also, BP was initially elevated but we recheck it: 142/98. O2 sat was checked 3 times, after she move around the room, her O2 sat decreased to 90, otherwise has been 97% and 98%. Plan: Chest x-ray, Zithromax, continue Mucinex, albuterol.  Discussed how to use an inhaler.  Strongly advised patient, low threshold to go to the ER if she is no quit improving.

## 2017-07-27 ENCOUNTER — Telehealth: Payer: Self-pay | Admitting: Internal Medicine

## 2017-07-27 NOTE — Telephone Encounter (Signed)
Patient was seen last week with respiratory symptoms, please check on her, feeling better?

## 2017-07-28 DIAGNOSIS — M25561 Pain in right knee: Secondary | ICD-10-CM | POA: Diagnosis not present

## 2017-07-28 DIAGNOSIS — M545 Low back pain: Secondary | ICD-10-CM | POA: Diagnosis not present

## 2017-07-28 DIAGNOSIS — R262 Difficulty in walking, not elsewhere classified: Secondary | ICD-10-CM | POA: Diagnosis not present

## 2017-07-28 DIAGNOSIS — M6281 Muscle weakness (generalized): Secondary | ICD-10-CM | POA: Diagnosis not present

## 2017-07-28 NOTE — Telephone Encounter (Signed)
Spoke w/ Pt, she is feeling better. However, she has hit her arm and has swelling and bruising and is worried about a blood clot- offered appt this afternoon however she has therapy and has cancelled several appts already- we decided on an appt tomorrow morning at 0840 w/ instructions to go to ED or urgent care if swelling/pain becomes worse.

## 2017-07-28 NOTE — Telephone Encounter (Signed)
Agree, thank you

## 2017-07-29 ENCOUNTER — Ambulatory Visit (INDEPENDENT_AMBULATORY_CARE_PROVIDER_SITE_OTHER): Payer: Medicare Other | Admitting: Internal Medicine

## 2017-07-29 ENCOUNTER — Encounter: Payer: Self-pay | Admitting: Internal Medicine

## 2017-07-29 VITALS — BP 124/74 | HR 109 | Temp 98.2°F | Resp 14 | Ht 64.0 in | Wt 175.2 lb

## 2017-07-29 DIAGNOSIS — S4992XA Unspecified injury of left shoulder and upper arm, initial encounter: Secondary | ICD-10-CM

## 2017-07-29 DIAGNOSIS — R05 Cough: Secondary | ICD-10-CM | POA: Diagnosis not present

## 2017-07-29 DIAGNOSIS — R059 Cough, unspecified: Secondary | ICD-10-CM

## 2017-07-29 NOTE — Progress Notes (Signed)
Subjective:    Patient ID: [name], female    DOB: Jul 20, 1922, 84 y.o.   MRN: [id number]  DOS:  07/29/2017 Type of visit - description : acute Interval history: 07/24/2016, she hit her left arm with storm door.  2 days later she noted swelling and bruising around the left elbow and left hand.  She had some pain but is not severe.   Also, recently seen with respiratory symptoms.  Cough has decreased significantly.  Still has more DOE than baseline.   Review of Systems Denies any other injuries or falls. No fever chills No chest pain, no palpitations.  Past Medical History:  Diagnosis Date  . AV block, 1st degree   . Bradycardia   . CAD (coronary artery disease)    Non ST elevation 2005 ; LHC in 6/05 in setting of NSTEMI: ant apical AK and inf-apical AK, EF 29%, dOM2 70-80% (small); findings c/w Tako-Tsubo CM  . Cardiomyopathy, nonischemic ([place]) 04/2008   Likely Tako-Tsubo. Resolved. --dx'd on cath 2005. EF 29% with minimal distal CAD (small OM1 70-80).  Echo 10/09 EF 55%;  echo 01/26/12: EF 55%, mild LAE, PASP 34.  . Colitis, ischemic ([place])   . Complication of anesthesia    post anesthesia excessive somnulence  . Compression fracture of C-spine ([place]) 10/10/2011   Fell at home, tx at [facility]  . Diarrhea associated with pseudomembranous colitis 6/11-17/2013   [facility]  . Dyslipidemia   . Fall    with non-healing rib fractures  . Glaucoma   . Granuloma annulare   . History of phlebitis   . Hypertension    SEVERE  . Idiopathic thrombocytopenic purpura (ITP)   . Lower extremity edema   . Lumbar stenosis 2004   DR. MARK ROY  . Multiple pelvic fractures 05/2012   Dr Adaline Sill , [facility]  . Osteoporosis     Past Surgical History:  Procedure Laterality Date  . ABDOMINAL HYSTERECTOMY    . APPENDECTOMY    . BACK SURGERY  10/2004   LS Disc 4-5 no sx.  Marland Kitchen CARDIAC CATHETERIZATION     12/2003  . CATARACT EXTRACTION, BILATERAL    . COLONOSCOPY  09/2005   adenoma  .  COLONOSCOPY W/ POLYPECTOMY    . 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 PACEMAKER IMPLANT N/A 11/06/2016   Procedure: Pacemaker Implant;  Surgeon: Evans Lance, MD;  Location: [place] [facility];  Service: Cardiovascular;  Laterality: N/A;  . rectal bleed  05-01-06   colonoscopy-ischemic colitis  . ROTATOR CUFF REPAIR    . VERTEBROPLASTY     Dr Gladstone Lighter    Social History   Socioeconomic History  . Marital status: Widowed    Spouse name: Not on file  . Number of children: 1  . Years of education: Not on file  . Highest education level: Not on file  Social Needs  . Financial resource strain: Not on file  . Food insecurity - worry: Not on file  . Food insecurity - inability: Not on file  . Transportation needs - medical: Not on file  . Transportation needs - non-medical: Not on file  Occupational History  . Occupation: retired    Fish farm manager: RETIRED  Tobacco Use  . Smoking status: Never Smoker  . Smokeless tobacco: Never Used  Substance and Sexual Activity  . Alcohol use: No  . Drug use: No  . Sexual activity: Not on file  Other Topics Concern  . Not on  file  Social History Narrative   Widowed   Lives by herself in a town house, drives    Son Constance Whittle       Allergies as of 07/29/2017      Reactions   Morphine And Related Anaphylaxis, Nausea Only   Per family violently ill   Oxycodone Other (See Comments)   Dizziness    Penicillins    Joint edema Has patient had a PCN reaction causing immediate rash, facial/tongue/throat swelling, SOB or lightheadedness with hypotension: Yes Has patient had a PCN reaction causing severe rash involving mucus membranes or skin necrosis: No Has patient had a PCN reaction that required hospitalization: No Has patient had a PCN reaction occurring within the last 10 years: No If all of the above answers are "NO", then may proceed with Cephalosporin use.   Colchicine Other (See Comments)   Hair loss   Hydroxychloroquine Other (See  Comments)   Unknown reaction   Norvasc [amlodipine Besylate]    Reaction not recalled by the patient   Ofloxacin Other (See Comments)   Unknown   Streptomycin Other (See Comments)   Unknown   Tramadol Itching   Alendronate Sodium Palpitations      Medication List        Accurate as of 07/29/17  1:11 PM. Always use your most recent med list.          albuterol 108 (90 Base) MCG/ACT inhaler Commonly known as:  VENTOLIN HFA Inhale 2 puffs into the lungs every 6 (six) hours as needed for wheezing or shortness of breath.   benazepril 20 MG tablet Commonly known as:  LOTENSIN Take 1 tablet (20 mg total) by mouth daily. Take an extra 20 mg (1 tab) if your systolic (top number) of Blood Pressure is 180 or higher.   beta carotene w/minerals tablet Take 1 tablet by mouth daily.   brimonidine 0.2 % ophthalmic solution Commonly known as:  ALPHAGAN Place 1 drop into the right eye 2 (two) times daily.   CALCIUM 1200 PO Take 1,200 mg by mouth daily.   carvedilol 12.5 MG tablet Commonly known as:  COREG Take 0.5 tablets (6.25 mg total) by mouth 2 (two) times daily.   denosumab 60 MG/ML Soln injection Commonly known as:  PROLIA Inject 60 mg into the skin every 6 (six) months. Administer in upper arm, thigh, or abdomen   diphenhydrAMINE 25 MG tablet Commonly known as:  BENADRYL Take 25 mg by mouth at bedtime as needed for sleep (and sinus issues).   docusate sodium 100 MG capsule Commonly known as:  COLACE Take 100 mg by mouth daily as needed for mild constipation. Stool softener   furosemide 40 MG tablet Commonly known as:  LASIX Take 1 tablet (40 mg total) by mouth daily. Take an additional 20 mg (1/2 tab) if weight is 174 or more   omeprazole 20 MG capsule Commonly known as:  PRILOSEC Take 20 mg by mouth daily.   OSTEO BI-FLEX ADV JOINT SHIELD Tabs Take 1 tablet by mouth daily.   REFRESH TEARS 0.5 % Soln Generic drug:  carboxymethylcellulose Place 1 drop into both  eyes 2 (two) times daily.   Rivaroxaban 15 MG Tabs tablet Commonly known as:  XARELTO Take 1 tablet (15 mg total) by mouth daily with supper.   sodium chloride 0.65 % Soln nasal spray Commonly known as:  OCEAN Place 1-2 sprays into both nostrils every 4 (four) hours as needed for congestion.   timolol 0.25 %  ophthalmic solution Commonly known as:  TIMOPTIC Place 1 drop into the right eye 2 (two) times daily.   vitamin B-12 1000 MCG tablet Commonly known as:  CYANOCOBALAMIN Take 2,000 mcg by mouth daily.   Vitamin D3 3000 units Tabs Take 1 tablet by mouth daily.          Objective:   Physical Exam BP 124/74 (BP Location: Right Arm, Patient Position: Sitting, Cuff Size: Small)   Pulse (!) 109   Temp 98.2 F (36.8 C) (Oral)   Resp 14   Ht 5' 4"  (1.626 m)   Wt 175 lb 4 oz (79.5 kg)   SpO2 96%   BMI 30.08 kg/m  General:   Well developed, well nourished . NAD.  HEENT:  Normocephalic . Face symmetric, atraumatic Lungs:  Slightly decreased breath sounds but otherwise clear Normal respiratory effort, no intercostal retractions, no accessory muscle use. Heart: Slightly tachycardic,  no murmur.  Upper extremities: Right arm normal Left arm: Has swelling and a ecchymoses @ L arm, see picture. No fluctuance, only the proximal area of the hematoma is slightly TTP and slightly warm.    Elbow is otherwise with full range of motion and no painful when she moves. Left hand and wrist is slightly swollen.  No TTP. Neurologic:  alert & oriented X3.  Speech normal, gait appropriate for age   Psych--  Cognition and judgment appear intact.  Cooperative with normal attention span and concentration.  Behavior appropriate. No anxious or depressed appearing.            Assessment & Plan:    Assessment   HTN severe CKD: creat ~1.4, 1.5 Dyslipidemia Hematology: --ITP-- hematology visit for 11-05-14, released to the care of PCP , check CBCs every 4 months, consult hematology  if platelets <50 K --Pernicious anemia --Anemia of chronic disease CV: --CAD --Tako Tsubo cardiomyopathy --Diastolic  CHF, resolved, Dr Missy Sabins --AV block first-degree ; Paroxysmal Afib with RVR/Tachybrady syndrome s/p admission: implanted aSt Jude PPM 11/06/16 DJD: --Compression fractures --Lumbar stenosis Dr. Carloyn Manner --Multiple pelvic fractures , cervical spine compression  fracture 2013 [facility] --H/o nonhealing rib  --Osteoporosis: Dr. Agapito Games; s/p forteo, s/p  Prolia 09-2015. Last DEXA 09-29-16 OPHT: --Glaucoma  --S/p cornea transplant -- mac degeneration h/o granuloma annulare   H/o pseudomembranous colitis 2013  PLAN: Left arm injury: Fortunately I do not suspect a major injury, rx ice, observation.  Call if worse.  See instructions Cough: See last visit, chest x-ray negative, had antibiotics, feeling better. CV: Slightly tachycardic today, asx.  Per chart review, cardiology advised against too strict BP control consequently we will not push up carvedilol. RTC as a schedule 09-2017

## 2017-07-29 NOTE — Patient Instructions (Signed)
Put ice near the elbow  Some swelling and discoloration of your forearm is expected.  If it is severe, the area feels hot to touch or you have fever please let me know

## 2017-07-29 NOTE — Progress Notes (Signed)
Pre visit review using our clinic review tool, if applicable. No additional management support is needed unless otherwise documented below in the visit note. 

## 2017-07-29 NOTE — Assessment & Plan Note (Signed)
Left arm injury: Fortunately I do not suspect a major injury, rx ice, observation.  Call if worse.  See instructions Cough: See last visit, chest x-ray negative, had antibiotics, feeling better. CV: Slightly tachycardic today, asx.  Per chart review, cardiology advised against too strict BP control consequently we will not push up carvedilol. RTC as a schedule 09-2017

## 2017-08-02 DIAGNOSIS — M545 Low back pain: Secondary | ICD-10-CM | POA: Diagnosis not present

## 2017-08-02 DIAGNOSIS — M6281 Muscle weakness (generalized): Secondary | ICD-10-CM | POA: Diagnosis not present

## 2017-08-02 DIAGNOSIS — M25561 Pain in right knee: Secondary | ICD-10-CM | POA: Diagnosis not present

## 2017-08-02 DIAGNOSIS — R262 Difficulty in walking, not elsewhere classified: Secondary | ICD-10-CM | POA: Diagnosis not present

## 2017-08-04 DIAGNOSIS — M25561 Pain in right knee: Secondary | ICD-10-CM | POA: Diagnosis not present

## 2017-08-04 DIAGNOSIS — M6281 Muscle weakness (generalized): Secondary | ICD-10-CM | POA: Diagnosis not present

## 2017-08-04 DIAGNOSIS — R262 Difficulty in walking, not elsewhere classified: Secondary | ICD-10-CM | POA: Diagnosis not present

## 2017-08-04 DIAGNOSIS — M545 Low back pain: Secondary | ICD-10-CM | POA: Diagnosis not present

## 2017-08-09 DIAGNOSIS — M25561 Pain in right knee: Secondary | ICD-10-CM | POA: Diagnosis not present

## 2017-08-09 DIAGNOSIS — R262 Difficulty in walking, not elsewhere classified: Secondary | ICD-10-CM | POA: Diagnosis not present

## 2017-08-09 DIAGNOSIS — M545 Low back pain: Secondary | ICD-10-CM | POA: Diagnosis not present

## 2017-08-09 DIAGNOSIS — M6281 Muscle weakness (generalized): Secondary | ICD-10-CM | POA: Diagnosis not present

## 2017-08-10 ENCOUNTER — Ambulatory Visit (INDEPENDENT_AMBULATORY_CARE_PROVIDER_SITE_OTHER): Payer: Medicare Other | Admitting: *Deleted

## 2017-08-10 DIAGNOSIS — I442 Atrioventricular block, complete: Secondary | ICD-10-CM | POA: Diagnosis not present

## 2017-08-10 NOTE — Progress Notes (Signed)
Remote pacemaker transmission.   

## 2017-08-11 DIAGNOSIS — R262 Difficulty in walking, not elsewhere classified: Secondary | ICD-10-CM | POA: Diagnosis not present

## 2017-08-11 DIAGNOSIS — M6281 Muscle weakness (generalized): Secondary | ICD-10-CM | POA: Diagnosis not present

## 2017-08-11 DIAGNOSIS — M25561 Pain in right knee: Secondary | ICD-10-CM | POA: Diagnosis not present

## 2017-08-11 DIAGNOSIS — M545 Low back pain: Secondary | ICD-10-CM | POA: Diagnosis not present

## 2017-08-12 ENCOUNTER — Encounter: Payer: Self-pay | Admitting: Cardiology

## 2017-08-16 DIAGNOSIS — M545 Low back pain: Secondary | ICD-10-CM | POA: Diagnosis not present

## 2017-08-16 DIAGNOSIS — M25561 Pain in right knee: Secondary | ICD-10-CM | POA: Diagnosis not present

## 2017-08-16 DIAGNOSIS — R262 Difficulty in walking, not elsewhere classified: Secondary | ICD-10-CM | POA: Diagnosis not present

## 2017-08-16 DIAGNOSIS — M6281 Muscle weakness (generalized): Secondary | ICD-10-CM | POA: Diagnosis not present

## 2017-08-18 DIAGNOSIS — M6281 Muscle weakness (generalized): Secondary | ICD-10-CM | POA: Diagnosis not present

## 2017-08-18 DIAGNOSIS — M545 Low back pain: Secondary | ICD-10-CM | POA: Diagnosis not present

## 2017-08-18 DIAGNOSIS — R262 Difficulty in walking, not elsewhere classified: Secondary | ICD-10-CM | POA: Diagnosis not present

## 2017-08-18 DIAGNOSIS — M25561 Pain in right knee: Secondary | ICD-10-CM | POA: Diagnosis not present

## 2017-08-23 DIAGNOSIS — R262 Difficulty in walking, not elsewhere classified: Secondary | ICD-10-CM | POA: Diagnosis not present

## 2017-08-23 DIAGNOSIS — M545 Low back pain: Secondary | ICD-10-CM | POA: Diagnosis not present

## 2017-08-23 DIAGNOSIS — M25561 Pain in right knee: Secondary | ICD-10-CM | POA: Diagnosis not present

## 2017-08-23 DIAGNOSIS — M6281 Muscle weakness (generalized): Secondary | ICD-10-CM | POA: Diagnosis not present

## 2017-08-24 ENCOUNTER — Telehealth: Payer: Self-pay

## 2017-08-24 NOTE — Telephone Encounter (Signed)
Received PT Plan of Care from [facility]. Form signed and faxed to [phone]. Form sent for scanning.

## 2017-08-25 DIAGNOSIS — M6281 Muscle weakness (generalized): Secondary | ICD-10-CM | POA: Diagnosis not present

## 2017-08-25 DIAGNOSIS — M545 Low back pain: Secondary | ICD-10-CM | POA: Diagnosis not present

## 2017-08-25 DIAGNOSIS — M25561 Pain in right knee: Secondary | ICD-10-CM | POA: Diagnosis not present

## 2017-08-25 DIAGNOSIS — R262 Difficulty in walking, not elsewhere classified: Secondary | ICD-10-CM | POA: Diagnosis not present

## 2017-08-25 LAB — CUP PACEART REMOTE DEVICE CHECK
Battery Remaining Longevity: 115 mo
Battery Voltage: 3.01 V
Brady Statistic AP VP Percent: 1 %
Brady Statistic RA Percent Paced: 2.1 %
Date Time Interrogation Session: 20190213070013
Implantable Lead Implant Date: 20180427
Implantable Lead Location: 753859
Implantable Pulse Generator Implant Date: 20180427
Lead Channel Impedance Value: 650 Ohm
Lead Channel Pacing Threshold Amplitude: 1 V
Lead Channel Pacing Threshold Pulse Width: 0.5 ms
Lead Channel Pacing Threshold Pulse Width: 0.5 ms
Lead Channel Setting Pacing Amplitude: 2.5 V
MDC IDC LEAD IMPLANT DT: 20180427
MDC IDC LEAD LOCATION: 753860
MDC IDC MSMT BATTERY REMAINING PERCENTAGE: 95.5 %
MDC IDC MSMT LEADCHNL RA IMPEDANCE VALUE: 460 Ohm
MDC IDC MSMT LEADCHNL RA SENSING INTR AMPL: 2.4 mV
MDC IDC MSMT LEADCHNL RV PACING THRESHOLD AMPLITUDE: 0.75 V
MDC IDC MSMT LEADCHNL RV SENSING INTR AMPL: 12 mV
MDC IDC SET LEADCHNL RV PACING AMPLITUDE: 2.5 V
MDC IDC SET LEADCHNL RV PACING PULSEWIDTH: 0.5 ms
MDC IDC SET LEADCHNL RV SENSING SENSITIVITY: 2 mV
MDC IDC STAT BRADY AP VS PERCENT: 2.6 %
MDC IDC STAT BRADY AS VP PERCENT: 1 %
MDC IDC STAT BRADY AS VS PERCENT: 96 %
MDC IDC STAT BRADY RV PERCENT PACED: 4.2 %
Pulse Gen Model: 2272
Pulse Gen Serial Number: 8900487

## 2017-08-30 DIAGNOSIS — M6281 Muscle weakness (generalized): Secondary | ICD-10-CM | POA: Diagnosis not present

## 2017-08-30 DIAGNOSIS — M25561 Pain in right knee: Secondary | ICD-10-CM | POA: Diagnosis not present

## 2017-08-30 DIAGNOSIS — R262 Difficulty in walking, not elsewhere classified: Secondary | ICD-10-CM | POA: Diagnosis not present

## 2017-08-30 DIAGNOSIS — M545 Low back pain: Secondary | ICD-10-CM | POA: Diagnosis not present

## 2017-09-06 ENCOUNTER — Telehealth: Payer: Self-pay

## 2017-09-06 NOTE — Telephone Encounter (Signed)
Called pt and made an appt for 09/07/17 at 1:40.

## 2017-09-06 NOTE — Telephone Encounter (Signed)
Copied from [place]. Topic: Appointment Scheduling - Scheduling Inquiry for Clinic >> Sep 06, 2017 12:10 PM Aurelio Brash B wrote: Reason for CRM: PT states shes been having left hip pain  and shes been doing PT  but she cancelled her PT and says she can't go back because of the pain and she can hardly walk  and can not lay down or sit down  and be comfortable,  she would like to know if Dr Larose Kells wants to see her or if he wants to refer her to a specialist?

## 2017-09-06 NOTE — Telephone Encounter (Signed)
Please advise 

## 2017-09-06 NOTE — Telephone Encounter (Signed)
If she does not see a orthopedic doctor I will be happy to see her and see what needs to be done. Arrange a visit at the patient's convenience.

## 2017-09-06 NOTE — Telephone Encounter (Signed)
[name]- okay to call Pt and schedule visit at her convenience please.

## 2017-09-07 ENCOUNTER — Ambulatory Visit (HOSPITAL_BASED_OUTPATIENT_CLINIC_OR_DEPARTMENT_OTHER)
Admission: RE | Admit: 2017-09-07 | Discharge: 2017-09-07 | Disposition: A | Discharge status: Home / Self Care | Payer: Medicare Other | Source: Ambulatory Visit | Attending: Internal Medicine | Admitting: Internal Medicine

## 2017-09-07 ENCOUNTER — Ambulatory Visit (INDEPENDENT_AMBULATORY_CARE_PROVIDER_SITE_OTHER): Payer: Medicare Other | Admitting: Internal Medicine

## 2017-09-07 ENCOUNTER — Encounter: Payer: Self-pay | Admitting: Internal Medicine

## 2017-09-07 VITALS — BP 128/78 | HR 106 | Temp 98.1°F | Resp 14 | Ht 64.0 in | Wt 175.2 lb

## 2017-09-07 DIAGNOSIS — M5136 Other intervertebral disc degeneration, lumbar region: Secondary | ICD-10-CM | POA: Insufficient documentation

## 2017-09-07 DIAGNOSIS — M545 Low back pain: Secondary | ICD-10-CM | POA: Diagnosis not present

## 2017-09-07 DIAGNOSIS — M5442 Lumbago with sciatica, left side: Secondary | ICD-10-CM

## 2017-09-07 DIAGNOSIS — I700 Atherosclerosis of aorta: Secondary | ICD-10-CM | POA: Diagnosis not present

## 2017-09-07 MED ORDER — PREDNISONE 10 MG PO TABS: ORAL_TABLET | ORAL | 0 refills | Status: DC

## 2017-09-07 NOTE — Progress Notes (Signed)
Pre visit review using our clinic review tool, if applicable. No additional management support is needed unless otherwise documented below in the visit note. 

## 2017-09-07 NOTE — Progress Notes (Signed)
Subjective:    Patient ID: [name], female    DOB: Jun 10, 1923, 84 y.o.   MRN: [id number]  DOS:  09/07/2017 Type of visit - description : Acute visit, here with her son Interval history: Symptoms of started 2 weeks ago, worse for the last 8 days. Pain is located at the low back, left side, some radiation to the buttock. Pain is a steady, increase or decrease depending on her position. No injury, although has been doing PT for leg strengthening for the last few weeks.   Review of Systems  Denies fever chills No rash No new bladder or bowel symptoms Some paresthesia at the lateral aspect of the left pretibial area  Past Medical History:  Diagnosis Date  . AV block, 1st degree   . Bradycardia   . CAD (coronary artery disease)    Non ST elevation 2005 ; LHC in 6/05 in setting of NSTEMI: ant apical AK and inf-apical AK, EF 29%, dOM2 70-80% (small); findings c/w Tako-Tsubo CM  . Cardiomyopathy, nonischemic ([place]) 04/2008   Likely Tako-Tsubo. Resolved. --dx'd on cath 2005. EF 29% with minimal distal CAD (small OM1 70-80).  Echo 10/09 EF 55%;  echo 01/26/12: EF 55%, mild LAE, PASP 34.  . Colitis, ischemic ([place])   . Complication of anesthesia    post anesthesia excessive somnulence  . Compression fracture of C-spine ([place]) 10/10/2011   Fell at home, tx at [facility]  . Diarrhea associated with pseudomembranous colitis 6/11-17/2013   [facility]  . Dyslipidemia   . Fall    with non-healing rib fractures  . Glaucoma   . Granuloma annulare   . History of phlebitis   . Hypertension    SEVERE  . Idiopathic thrombocytopenic purpura (ITP)   . Lower extremity edema   . Lumbar stenosis 2004   DR. MARK ROY  . Multiple pelvic fractures 05/2012   Dr Adaline Sill , [facility]  . Osteoporosis     Past Surgical History:  Procedure Laterality Date  . ABDOMINAL HYSTERECTOMY    . APPENDECTOMY    . BACK SURGERY  10/2004   LS Disc 4-5 no sx.  Marland Kitchen CARDIAC CATHETERIZATION     12/2003  .  CATARACT EXTRACTION, BILATERAL    . COLONOSCOPY  09/2005   adenoma  . COLONOSCOPY W/ POLYPECTOMY    . 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 PACEMAKER IMPLANT N/A 11/06/2016   Procedure: Pacemaker Implant;  Surgeon: Evans Lance, MD;  Location: [place] [facility];  Service: Cardiovascular;  Laterality: N/A;  . rectal bleed  05-01-06   colonoscopy-ischemic colitis  . ROTATOR CUFF REPAIR    . VERTEBROPLASTY     Dr Gladstone Lighter    Social History   Socioeconomic History  . Marital status: Widowed    Spouse name: Not on file  . Number of children: 1  . Years of education: Not on file  . Highest education level: Not on file  Social Needs  . Financial resource strain: Not on file  . Food insecurity - worry: Not on file  . Food insecurity - inability: Not on file  . Transportation needs - medical: Not on file  . Transportation needs - non-medical: Not on file  Occupational History  . Occupation: retired    Fish farm manager: RETIRED  Tobacco Use  . Smoking status: Never Smoker  . Smokeless tobacco: Never Used  Substance and Sexual Activity  . Alcohol use: No  . Drug use: No  . Sexual  activity: Not on file  Other Topics Concern  . Not on file  Social History Narrative   Widowed   Lives by herself in a town house, drives    Son [name]       Allergies as of 09/07/2017      Reactions   Morphine And Related Anaphylaxis, Nausea Only   Per family violently ill   Oxycodone Other (See Comments)   Dizziness    Penicillins    Joint edema Has patient had a PCN reaction causing immediate rash, facial/tongue/throat swelling, SOB or lightheadedness with hypotension: Yes Has patient had a PCN reaction causing severe rash involving mucus membranes or skin necrosis: No Has patient had a PCN reaction that required hospitalization: No Has patient had a PCN reaction occurring within the last 10 years: No If all of the above answers are "NO", then may proceed with Cephalosporin use.    Colchicine Other (See Comments)   Hair loss   Hydroxychloroquine Other (See Comments)   Unknown reaction   Norvasc [amlodipine Besylate]    Reaction not recalled by the patient   Ofloxacin Other (See Comments)   Unknown   Streptomycin Other (See Comments)   Unknown   Tramadol Itching   Alendronate Sodium Palpitations      Medication List        Accurate as of 09/07/17 11:59 PM. Always use your most recent med list.          albuterol 108 (90 Base) MCG/ACT inhaler Commonly known as:  VENTOLIN HFA Inhale 2 puffs into the lungs every 6 (six) hours as needed for wheezing or shortness of breath.   benazepril 20 MG tablet Commonly known as:  LOTENSIN Take 1 tablet (20 mg total) by mouth daily. Take an extra 20 mg (1 tab) if your systolic (top number) of Blood Pressure is 180 or higher.   beta carotene w/minerals tablet Take 1 tablet by mouth daily.   brimonidine 0.2 % ophthalmic solution Commonly known as:  ALPHAGAN Place 1 drop into the right eye 2 (two) times daily.   CALCIUM 1200 PO Take 1,200 mg by mouth daily.   carvedilol 12.5 MG tablet Commonly known as:  COREG Take 0.5 tablets (6.25 mg total) by mouth 2 (two) times daily.   denosumab 60 MG/ML Soln injection Commonly known as:  PROLIA Inject 60 mg into the skin every 6 (six) months. Administer in upper arm, thigh, or abdomen   diphenhydrAMINE 25 MG tablet Commonly known as:  BENADRYL Take 25 mg by mouth at bedtime as needed for sleep (and sinus issues).   docusate sodium 100 MG capsule Commonly known as:  COLACE Take 100 mg by mouth daily as needed for mild constipation. Stool softener   furosemide 40 MG tablet Commonly known as:  LASIX Take 1 tablet (40 mg total) by mouth daily. Take an additional 20 mg (1/2 tab) if weight is 174 or more   omeprazole 20 MG capsule Commonly known as:  PRILOSEC Take 20 mg by mouth daily.   OSTEO BI-FLEX ADV JOINT SHIELD Tabs Take 1 tablet by mouth daily.   predniSONE  10 MG tablet Commonly known as:  DELTASONE 4 tablets x 2 days, 3 tabs x 2 days, 2 tabs x 2 days, 1 tab x 2 days   REFRESH TEARS 0.5 % Soln Generic drug:  carboxymethylcellulose Place 1 drop into both eyes 2 (two) times daily.   Rivaroxaban 15 MG Tabs tablet Commonly known as:  XARELTO Take 1  tablet (15 mg total) by mouth daily with supper.   sodium chloride 0.65 % Soln nasal spray Commonly known as:  OCEAN Place 1-2 sprays into both nostrils every 4 (four) hours as needed for congestion.   timolol 0.25 % ophthalmic solution Commonly known as:  TIMOPTIC Place 1 drop into the right eye 2 (two) times daily.   vitamin B-12 1000 MCG tablet Commonly known as:  CYANOCOBALAMIN Take 2,000 mcg by mouth daily.   Vitamin D3 3000 units Tabs Take 1 tablet by mouth daily.          Objective:   Physical Exam  Skin:      BP 128/78 (BP Location: Left Arm, Patient Position: Sitting, Cuff Size: Small)   Pulse (!) 106   Temp 98.1 F (36.7 C) (Oral)   Resp 14   Ht _0  (1.626 m)   Wt 175 lb 4 oz (79.5 kg)   SpO2 96%   BMI 30.08 kg/m  General:   Well developed, well nourished . NAD.  HEENT:  Normocephalic . Face symmetric, atraumatic. MSK: Mildly to moderately TTP at the low back, see graphic. Slightly TTP at the left trochanteric bursa.  Normal at the right side. Hip rotation examined while the patient was sitting in the chair and seems okay. Skin: Rash consistent with history of granuloma annulare Neurologic:  alert & oriented X3.  Speech normal, gait limited by pain, unable to lay down in the examining table.  Moves with difficulty. Motor and DTRs symmetric. Psych--  Cognition and judgment appear intact.  Cooperative with normal attention span and concentration.  Behavior appropriate. No anxious or depressed appearing.      Assessment & Plan:  Assessment   HTN severe CKD: creat ~1.4, 1.5 Dyslipidemia Hematology: --ITP-- hematology visit for 11-05-14, released to  the care of PCP , check CBCs every 4 months, consult hematology if platelets <50 K --Pernicious anemia --Anemia of chronic disease CV: --CAD --Tako Tsubo cardiomyopathy --Diastolic  CHF, resolved, Dr Missy Sabins --AV block first-degree ; Paroxysmal Afib with RVR/Tachybrady syndrome s/p admission: implanted aSt Jude PPM 11/06/16 DJD: --Compression fractures --Lumbar stenosis Dr. Carloyn Manner --Multiple pelvic fractures , cervical spine compression  fracture 2013 [facility] --H/o nonhealing rib  --Osteoporosis: Dr. Agapito Games; s/p forteo, s/p  Prolia 09-2015. Last DEXA 09-29-16 OPHT: --Glaucoma  --S/p cornea transplant -- mac degeneration h/o granuloma annulare   H/o pseudomembranous colitis 2013  PLAN: Acute left low back pain w/ some radicular feature: Sxs started 2 weeks ago, worse in the last few days, slightly TTP at the lumbar spine.  Neuro exam is symmetric.  Doubt pain is generated by the hip. No fall or injury but she has been more active for the last few weeks doing physical therapy. Plan: X-ray to r/o lytic or bone lesion.  Pain management with prednisone and Tylenol.  Allergic to morphine, intolerant to Ultram, unable to take NSAIDs due to kidney disease. If not improving soon, consider further eval or referral to Ortho Extensively discussed situation (particularly pain management)  with the patient and her son.   Today, I spent more than 25   min with the patient: >50% of the time counseling regards pain management, treatment options, why to avoid meds like nsaids

## 2017-09-07 NOTE — Patient Instructions (Addendum)
Get your x-rays downstairs  Prednisone as prescribed  Tylenol  500 mg OTC 2 tabs a day every 8 hours as needed for pain  Call in few days if you are not improving or if the pain is severe

## 2017-09-08 NOTE — Assessment & Plan Note (Signed)
Acute left low back pain w/ some radicular feature: Sxs started 2 weeks ago, worse in the last few days, slightly TTP at the lumbar spine.  Neuro exam is symmetric.  Doubt pain is generated by the hip. No fall or injury but she has been more active for the last few weeks doing physical therapy. Plan: X-ray to r/o lytic or bone lesion.  Pain management with prednisone and Tylenol.  Allergic to morphine, intolerant to Ultram, unable to take NSAIDs due to kidney disease. If not improving soon, consider further eval or referral to Ortho Extensively discussed situation (particularly pain management)  with the patient and her son.

## 2017-09-13 ENCOUNTER — Ambulatory Visit: Payer: Medicare Other | Admitting: Internal Medicine

## 2017-09-23 ENCOUNTER — Encounter: Payer: Self-pay | Admitting: Internal Medicine

## 2017-09-23 ENCOUNTER — Telehealth: Payer: Self-pay | Admitting: Internal Medicine

## 2017-09-23 ENCOUNTER — Ambulatory Visit: Payer: Self-pay | Admitting: *Deleted

## 2017-09-23 ENCOUNTER — Ambulatory Visit: Payer: Self-pay

## 2017-09-23 DIAGNOSIS — M5410 Radiculopathy, site unspecified: Secondary | ICD-10-CM

## 2017-09-23 NOTE — Telephone Encounter (Signed)
Pt called. No answer. Left message to give [place] a call back so we can discuss what is going on with her and how we can help her.

## 2017-09-23 NOTE — Telephone Encounter (Signed)
Ortho referral placed, unsure if MRI lumbar w/o includes sacral region or not. Imaging recommended to call radiology.

## 2017-09-23 NOTE — Telephone Encounter (Signed)
Arrange urgent Ortho referral Order a lumbosacral spine MRI without contrast DX back pain with radiculopathy Let patient know

## 2017-09-23 NOTE — Telephone Encounter (Signed)
Pls disregard.

## 2017-09-23 NOTE — Telephone Encounter (Signed)
Spoke w/ Pt- informed that urgent ortho referral has been placed- and unable to do MRI d/t pacemaker- Pt verbalized understanding. I also informed her if pain becomes severe- go to ER or UC. Pt again verbalized understanding.

## 2017-09-23 NOTE — Telephone Encounter (Signed)
Pt reports lower back pain radiates to left hip, down to ankle; onset 6 weeks ago.  Was seen 09/07/17 by Dr. Larose Kells for similar symptoms. Placed on course of prednisone which pt states she completed 9 days ago. Has been taking tylenol and an "occasional Advil." Reports "very little relief." States pain 10/10 with ambulation, 4-5/10 at rest. Also reports some "numbness at ankle, extends 5-6 inches up leg."  Denies any "more than usual" B/B issues. No redness, warmth. States Dr. Larose Kells told her if pain unresolved he would make referral and order MRI.  Pt requesting this be done. Declines OV at this time; questioning if Dr. Larose Kells will order referral.  Care advise given. Please advise. Reason for Disposition . [1] Pain radiates into the thigh or further down the leg AND [2] one leg  Answer Assessment - Initial Assessment Questions 1. ONSET: "When did the pain begin?"      6 weeks ago 2. LOCATION: "Where does it hurt?" (upper, mid or lower back)     Lower back, mostly left hip down to ankle 3. SEVERITY: "How bad is the pain?"  (e.g., Scale 1-10; mild, moderate, or severe)   - MILD (1-3): doesn't interfere with normal activities    - MODERATE (4-7): interferes with normal activities or awakens from sleep    - SEVERE (8-10): excruciating pain, unable to do any normal activities      10/10 with walking;   4-5/10 at rest 4. PATTERN: "Is the pain constant?" (e.g., yes, no; constant, intermittent)      constant 5. RADIATION: "Does the pain shoot into your legs or elsewhere?"     "Shoots down hip to ankle." 6. CAUSE:  "What do you think is causing the back pain?"       "Sciatica" 7. BACK OVERUSE:  "Any recent lifting of heavy objects, strenuous work or exercise?"     No 8. MEDICATIONS: "What have you taken so far for the pain?" (e.g., nothing, acetaminophen, NSAIDS)     Tylenol, ineffective, Advil helps a little; finished course of prednisone. 9. NEUROLOGIC SYMPTOMS: "Do you have any weakness, numbness, or  problems with bowel/bladder control?"    Feels numb ankle about 6-8-inches. 10. OTHER SYMPTOMS: "Do you have any other symptoms?" (e.g., fever, abdominal pain, burning with urination, blood in urine)       No  Protocols used: BACK PAIN-A-AH

## 2017-09-23 NOTE — Telephone Encounter (Signed)
Please advise 

## 2017-09-23 NOTE — Telephone Encounter (Signed)
Correction: The patient has a pacemaker, unable to do MRI, further imaging per orthopedic

## 2017-09-23 NOTE — Telephone Encounter (Signed)
This encounter was created in error - please disregard.

## 2017-09-23 NOTE — Telephone Encounter (Signed)
Copied from [place] [id number]. Topic: Quick Communication - See Telephone Encounter >> Sep 23, 2017 11:29 AM Burnis Medin, NT wrote: CRM for notification. See Telephone encounter for: Patient called and said she is still  having a lot of pain and it hurts when she walks. Pt wants to know what she should do and would like a call back.  09/23/17.

## 2017-09-27 ENCOUNTER — Encounter (INDEPENDENT_AMBULATORY_CARE_PROVIDER_SITE_OTHER): Payer: Self-pay | Admitting: Orthopaedic Surgery

## 2017-09-27 ENCOUNTER — Ambulatory Visit (INDEPENDENT_AMBULATORY_CARE_PROVIDER_SITE_OTHER): Payer: Self-pay

## 2017-09-27 ENCOUNTER — Ambulatory Visit (INDEPENDENT_AMBULATORY_CARE_PROVIDER_SITE_OTHER): Payer: Medicare Other | Admitting: Orthopaedic Surgery

## 2017-09-27 VITALS — BP 184/100 | HR 98 | Resp 16 | Ht 64.0 in | Wt 170.0 lb

## 2017-09-27 DIAGNOSIS — M25552 Pain in left hip: Secondary | ICD-10-CM | POA: Diagnosis not present

## 2017-09-27 DIAGNOSIS — M4807 Spinal stenosis, lumbosacral region: Secondary | ICD-10-CM

## 2017-09-27 NOTE — Progress Notes (Signed)
Office Visit Note   Patient: [name]           Date of Birth: [birth year]           MRN: [id number] Visit Date: 09/27/2017              Requested by: Colon Branch, [address] [place], [place] [address] PCP: Colon Branch, MD   Assessment & Plan: Visit Diagnoses:  1. Pain in left hip   2. Spinal stenosis of lumbosacral region     Plan: Insidious onset low back pain within the past month with out injury or trauma.  Does have an issue with balance..  Prior history of kyphoplasty 20 years ago.  Presently having back pain associated with left lower extremity radicular discomfort as far distally as the left foot.  Films demonstrate significant degenerative changes and possible spinal stenosis.of  Lumbar spine will order CT scan as the patient has a pacemaker follow-up shortly thereafter.  Continue with Tylenol  Follow-Up Instructions: Return after CT scan L-S spine.   Orders:  Orders Placed This Encounter  Procedures  . XR HIP UNILAT W OR W/O PELVIS 2-3 VIEWS LEFT  . CT LUMBAR SPINE WO CONTRAST   No orders of the defined types were placed in this encounter.     Procedures: No procedures performed   Clinical Data: No additional findings.   Subjective: Chief Complaint  Patient presents with  . Left Hip - Pain    MRS [name] IS 84 Y O F HERE FOR LEFT HIP PAIN, WAS TAKING PT FOR LEGS WEAKNESS AND NOW HAS HAD MORE PAIN IN LEFT HIP. NO INJURY OR INJECTIONS.  [name] is accompanied by a family member and here for valuation of low back pain.  She first experienced onset of pain within the past 4 weeks.  No injury or trauma but she is apt to fall related to balance.  Does have a pacemaker.  Had prior vertebroplasty over 20 years ago at what appears to be T12.  Pain originates in the lumbar spine radiates as far distally as her left foot associated with numbness and tingling.  Seems to be worse the longer she stands with the further she walks.  No pain in right  lower extremity.  No bowel or bladder changes.  Taking Tylenol for pain. I reviewed films of her lumbar spine on the PACS system 5 nonrib-bearing lumbar vertebrae.  There is anterior listhesis of L4 and L5.  Some decrease in the disc space at L5-S1.  Diffuse decreased bone mineralization.  Old methacrylate injection and what appeared to be T12 with significant collapse.  Increased kyphosis with apex at T11.  Could have canal compromise at T10 and 11  HPI  Review of Systems  Constitutional: Positive for fatigue. Negative for fever.  HENT: Negative for ear pain.   Eyes: Negative for pain.  Respiratory: Positive for shortness of breath. Negative for cough.   Cardiovascular: Positive for leg swelling.  Gastrointestinal: Negative for blood in stool, constipation and diarrhea.  Genitourinary: Negative for dysuria.  Musculoskeletal: Negative for back pain and neck pain.  Skin: Negative for rash and wound.  Allergic/Immunologic: Negative for food allergies.  Neurological: Positive for weakness and numbness. Negative for dizziness, light-headedness and headaches.  Hematological: Bruises/bleeds easily.  Psychiatric/Behavioral: Negative for sleep disturbance.     Objective: Vital Signs: BP (!) 184/100 (BP Location: Left Arm, Patient Position: Sitting, Cuff Size: Normal)   Pulse  98   Resp 16   Ht 5\' 4"  (1.626 m)   Wt 170 lb (77.1 kg)   BMI 29.18 kg/m   Physical Exam  Ortho Exam awake alert and oriented x3.  Comfortable sitting.  Leg raise negative bilaterally.  Reflexes symmetrical.  Neurovascular exam intact.  Painless range of motion both hips.  Percussible tenderness to lower lumbar spine.  No flank pain.  Discomfort at the thoracolumbar junction  Specialty Comments:  No specialty comments available.  Imaging: Xr Hip Unilat W Or W/o Pelvis 2-3 Views Left  Result Date: 09/27/2017 AP the pelvis and lateral left hip with evidence of osteopenia porosis.  No acute changes.  Joint space is  well-maintained.  Sacroiliac joints intact.    PMFS History: Patient Active Problem List   Diagnosis Date Noted  . Complete heart block ([place]) 11/06/2016  . Palpitations 10/27/2016  . Coronary artery disease 04/29/2016  . Indigestion 09/25/2015  . PCP NOTES >>>>>>>>>>>>>>>>>> 04/09/2015  . *CKD (chronic kidney disease) stage 3, GFR 30-59 ml/min 08/14/2013  . Ectopic atrial rhythm 08/14/2013  . *Diastolic CHF, chronic 53/97/6734  . Fractures involving multiple body regions 12/22/2011  . Idiopathic thrombocytopenic purpura ([place]) 09/25/2011  . Edema   . ARTHRALGIA 11/14/2009  . CARDIOMYOPATHY, PRIMARY, DILATED 10/07/2009  . URINARY INCONTINENCE 03/11/2009  . *ANEMIA, PERNICIOUS 05/22/2008  . Takotsubo syndrome 05/22/2008  . SUPRAVENTRICULAR TACHYCARDIA, PAROXYSMAL, HX OF 05/22/2008  . POPLITEAL CYST 11/07/2007  . *Essential hypertension 09/19/2007  . PHLEBITIS 09/19/2007  . ISCHEMIC COLITIS, HX OF 09/19/2007  . 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 *Osteoporosis 08/19/2006  . COLONIC POLYPS, HX OF 08/19/2006   Past Medical History:  Diagnosis Date  . AV block, 1st degree   . Bradycardia   . CAD (coronary artery disease)    Non ST elevation 2005 ; LHC in 6/05 in setting of NSTEMI: ant apical AK and inf-apical AK, EF 29%, dOM2 70-80% (small); findings c/w Tako-Tsubo CM  . Cardiomyopathy, nonischemic ([place]) 04/2008   Likely Tako-Tsubo. Resolved. --dx'd on cath 2005. EF 29% with minimal distal CAD (small OM1 70-80).  Echo 10/09 EF 55%;  echo 01/26/12: EF 55%, mild LAE, PASP 34.  . Colitis, ischemic ([place])   . Complication of anesthesia    post anesthesia excessive somnulence  . Compression fracture of C-spine ([place]) 10/10/2011   Fell at home, tx at [facility]  . Diarrhea associated with pseudomembranous colitis 6/11-17/2013   [facility]  . Dyslipidemia   . Fall    with non-healing rib fractures  . Glaucoma   . Granuloma annulare   . History of phlebitis   . Hypertension    SEVERE    . Idiopathic thrombocytopenic purpura (ITP)   . Lower extremity edema   . Lumbar stenosis 2004   DR. MARK ROY  . Multiple pelvic fractures 05/2012   Dr Adaline Sill , [facility]  . Osteoporosis     Family History  Problem Relation Age of Onset  . Coronary artery disease Mother   . Heart failure Mother   . Renal cancer Mother   . Kidney disease Mother   . Stroke Father        in his 27s  . Colon cancer Father   . Breast cancer Sister         X 2  . Rectal cancer Sister   . Breast cancer Sister   . Diabetes Neg Hx     Past Surgical History:  Procedure Laterality Date  . ABDOMINAL HYSTERECTOMY    .  APPENDECTOMY    . BACK SURGERY  10/2004   LS Disc 4-5 no sx.  Marland Kitchen CARDIAC CATHETERIZATION     12/2003  . CATARACT EXTRACTION, BILATERAL    . COLONOSCOPY  09/2005   adenoma  . COLONOSCOPY W/ POLYPECTOMY    . 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 PACEMAKER IMPLANT N/A 11/06/2016   Procedure: Pacemaker Implant;  Surgeon: Evans Lance, MD;  Location: [place] [facility];  Service: Cardiovascular;  Laterality: N/A;  . rectal bleed  05-01-06   colonoscopy-ischemic colitis  . ROTATOR CUFF REPAIR    . VERTEBROPLASTY     Dr Gladstone Lighter   Social History   Occupational History  . Occupation: retired    Fish farm manager: RETIRED  Tobacco Use  . Smoking status: Never Smoker  . Smokeless tobacco: Never Used  Substance and Sexual Activity  . Alcohol use: No  . Drug use: No  . Sexual activity: Not on file

## 2017-10-07 ENCOUNTER — Ambulatory Visit
Admission: RE | Admit: 2017-10-07 | Discharge: 2017-10-07 | Disposition: A | Discharge status: Home / Self Care | Payer: Medicare Other | Source: Ambulatory Visit | Attending: Orthopaedic Surgery | Admitting: Orthopaedic Surgery

## 2017-10-07 DIAGNOSIS — M545 Low back pain: Secondary | ICD-10-CM | POA: Diagnosis not present

## 2017-10-07 DIAGNOSIS — M4807 Spinal stenosis, lumbosacral region: Secondary | ICD-10-CM

## 2017-10-15 ENCOUNTER — Ambulatory Visit (INDEPENDENT_AMBULATORY_CARE_PROVIDER_SITE_OTHER): Payer: Medicare Other | Admitting: Orthopaedic Surgery

## 2017-10-15 ENCOUNTER — Encounter (INDEPENDENT_AMBULATORY_CARE_PROVIDER_SITE_OTHER): Payer: Self-pay | Admitting: Orthopaedic Surgery

## 2017-10-15 ENCOUNTER — Other Ambulatory Visit (INDEPENDENT_AMBULATORY_CARE_PROVIDER_SITE_OTHER): Payer: Self-pay | Admitting: Radiology

## 2017-10-15 VITALS — BP 148/100 | HR 87 | Resp 16 | Ht 64.0 in | Wt 170.0 lb

## 2017-10-15 DIAGNOSIS — M545 Low back pain: Secondary | ICD-10-CM

## 2017-10-15 DIAGNOSIS — G8929 Other chronic pain: Secondary | ICD-10-CM | POA: Diagnosis not present

## 2017-10-15 DIAGNOSIS — M5442 Lumbago with sciatica, left side: Secondary | ICD-10-CM | POA: Diagnosis not present

## 2017-10-15 NOTE — Progress Notes (Signed)
Office Visit Note   Patient: [name]           Date of Birth: August 24, 1922           MRN: [id number] Visit Date: 10/15/2017              Requested by: [name], [address] [place], [place] [address] PCP: Daisy Branch, MD   Assessment & Plan: Visit Diagnoses:  1. Chronic left-sided low back pain with left-sided sciatica     Plan: MRI scan demonstrates chronic compression fracture of T12 with kyphosis that is unchanged.  There is moderate spinal stenosis at L3-4 which is unchanged.  And at L4-5 there was severe spinal stenosis which is also unchanged from a prior study.  Overall there was no new findings from the scan in 2005.  Long discussion with [name] and her son.  We will try a course of physical therapy.  If this is not helpful we will consider an epidural steroid injection.  Plan to see her back as needed  Follow-Up Instructions: Return if symptoms worsen or fail to improve.   Orders:  No orders of the defined types were placed in this encounter.  No orders of the defined types were placed in this encounter.     Procedures: No procedures performed   Clinical Data: No additional findings.   Subjective: Chief Complaint  Patient presents with  . Follow-up    CT L SPINE RESULTS  [name] is accompanied by her son and here for follow-up evaluation of the issues with her back as previously identified.  No new symptoms.  She does have some back discomfort when she stands or walks any distance will experience some pain into her left lower extremity.  She actually is feeling a little bit better.  She had been sent to physical therapy by her primary care physician to help strengthen her legs and felt like that may have been responsible for some of her left leg pain.  As she is not doing the exercises she is feeling better  HPI  Review of Systems  Constitutional: Negative for fatigue.  HENT: Negative for ear pain.   Eyes: Negative for  pain.  Respiratory: Negative for cough and shortness of breath.   Cardiovascular: Positive for leg swelling.  Gastrointestinal: Negative for constipation and diarrhea.  Genitourinary: Negative for difficulty urinating.  Musculoskeletal: Negative for back pain and neck pain.  Skin: Negative for rash.  Allergic/Immunologic: Negative for food allergies.  Neurological: Negative for dizziness, weakness, numbness and headaches.  Hematological: Bruises/bleeds easily.  Psychiatric/Behavioral: Negative for sleep disturbance.     Objective: Vital Signs: BP (!) 148/100 (BP Location: Left Arm, Patient Position: Sitting, Cuff Size: Normal)   Pulse 87   Resp 16   Ht 5\' 4"  (1.626 m)   Wt 170 lb (77.1 kg)   BMI 29.18 kg/m   Physical Exam  Ortho Exam awake alert and oriented x3.  Comfortable sitting.  Hard of hearing.  Walks with a cane.  Very pleasant.  No shortness of breath or chest pain.  Straight leg raise negative bilaterally.  Painless range of motion both hips  Specialty Comments:  No specialty comments available.  Imaging: No results found.   PMFS History: Patient Active Problem List   Diagnosis Date Noted  . Complete heart block ([place]) 11/06/2016  . Palpitations 10/27/2016  . Coronary artery disease 04/29/2016  . Indigestion 09/25/2015  . PCP NOTES >>>>>>>>>>>>>>>>>>  04/09/2015  . *CKD (chronic kidney disease) stage 3, GFR 30-59 ml/min 08/14/2013  . Ectopic atrial rhythm 08/14/2013  . *Diastolic CHF, chronic 22/29/7989  . Fractures involving multiple body regions 12/22/2011  . Idiopathic thrombocytopenic purpura ([place]) 09/25/2011  . Edema   . ARTHRALGIA 11/14/2009  . CARDIOMYOPATHY, PRIMARY, DILATED 10/07/2009  . URINARY INCONTINENCE 03/11/2009  . *ANEMIA, PERNICIOUS 05/22/2008  . Takotsubo syndrome 05/22/2008  . SUPRAVENTRICULAR TACHYCARDIA, PAROXYSMAL, HX OF 05/22/2008  . POPLITEAL CYST 11/07/2007  . *Essential hypertension 09/19/2007  . PHLEBITIS 09/19/2007  .  ISCHEMIC COLITIS, HX OF 09/19/2007  . 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 *Osteoporosis 08/19/2006  . COLONIC POLYPS, HX OF 08/19/2006   Past Medical History:  Diagnosis Date  . AV block, 1st degree   . Bradycardia   . CAD (coronary artery disease)    Non ST elevation 2005 ; LHC in 6/05 in setting of NSTEMI: ant apical AK and inf-apical AK, EF 29%, dOM2 70-80% (small); findings c/w Tako-Tsubo CM  . Cardiomyopathy, nonischemic ([place]) 04/2008   Likely Tako-Tsubo. Resolved. --dx'd on cath 2005. EF 29% with minimal distal CAD (small OM1 70-80).  Echo 10/09 EF 55%;  echo 01/26/12: EF 55%, mild LAE, PASP 34.  . Colitis, ischemic ([place])   . Complication of anesthesia    post anesthesia excessive somnulence  . Compression fracture of C-spine ([place]) 10/10/2011   Fell at home, tx at [facility]  . Diarrhea associated with pseudomembranous colitis 6/11-17/2013   [facility]  . Dyslipidemia   . Fall    with non-healing rib fractures  . Glaucoma   . Granuloma annulare   . History of phlebitis   . Hypertension    SEVERE  . Idiopathic thrombocytopenic purpura (ITP) ([facility])   . Lower extremity edema   . Lumbar stenosis 2004   DR. MARK ROY  . Multiple pelvic fractures 05/2012   Dr Adaline Sill , [facility]  . Osteoporosis     Family History  Problem Relation Age of Onset  . Coronary artery disease Mother   . Heart failure Mother   . Renal cancer Mother   . Kidney disease Mother   . Stroke Father        in his 22s  . Daisy cancer Father   . Breast cancer Sister         X 2  . Rectal cancer Sister   . Breast cancer Sister   . Diabetes Neg Hx     Past Surgical History:  Procedure Laterality Date  . ABDOMINAL HYSTERECTOMY    . APPENDECTOMY    . BACK SURGERY  10/2004   LS Disc 4-5 no sx.  Marland Kitchen CARDIAC CATHETERIZATION     12/2003  . CATARACT EXTRACTION, BILATERAL    . COLONOSCOPY  09/2005   adenoma  . COLONOSCOPY W/ POLYPECTOMY    . 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 PACEMAKER IMPLANT N/A  11/06/2016   Procedure: Pacemaker Implant;  Surgeon: Evans Lance, MD;  Location: [facility] [place] [facility];  Service: Cardiovascular;  Laterality: N/A;  . rectal bleed  05-01-06   colonoscopy-ischemic colitis  . ROTATOR CUFF REPAIR    . VERTEBROPLASTY     Dr Gladstone Lighter   Social History   Occupational History  . Occupation: retired    Fish farm manager: RETIRED  Tobacco Use  . Smoking status: Never Smoker  . Smokeless tobacco: Never Used  Substance and Sexual Activity  . Alcohol use: No  . Drug use: No  . Sexual  activity: Not on file

## 2017-10-28 NOTE — Progress Notes (Addendum)
Subjective:   [name] is a 84 y.o. female who presents for Medicare Annual (Subsequent) preventive examination.  Review of Systems: No ROS.  Medicare Wellness Visit. Additional risk factors are reflected in the social history.  Cardiac Risk Factors include: advanced age (>23men, >46 women);hypertension Sleep patterns:sleeps well. Naps as needed.  Home Safety/Smoke Alarms: Feels safe in home. Smoke alarms in place.  Living environment; residence and Firearm Safety: Doesn't generally do stairs. Uses cane.    Female:     Mammo- No longer doing routine screening due to age per pt       Dexa scan- No longer doing routine screening due to age per pt       Objective:     Vitals: BP 140/80 (BP Location: Left Arm, Patient Position: Sitting, Cuff Size: Normal)   Pulse 84   Ht 5\' 4"  (1.626 m)   Wt 171 lb 3.2 oz (77.7 kg)   SpO2 96%   BMI 29.39 kg/m   Body mass index is 29.39 kg/m.  Advanced Directives 11/01/2017 01/26/2017 11/07/2016 07/10/2016 10/28/2015 09/24/2015 09/23/2015  Does Patient Have a Medical Advance Directive? Yes No Yes;No Yes Yes Yes Yes  Type of Academic librarian;Living will - - [place];Living will [place];Living will - -  Does patient want to make changes to medical advance directive? No - Patient declined - - Yes (MAU/Ambulatory/Procedural Areas - Information given) - - -  Copy of [place] in Chart? Yes - - No - copy requested No - copy requested - -  Would patient like information on creating a medical advance directive? - - No - Patient declined - - - -    Tobacco Social History   Tobacco Use  Smoking Status Never Smoker  Smokeless Tobacco Never Used     Counseling given: Not Answered   Clinical Intake: Pain : No/denies pain   Past Medical History:  Diagnosis Date  . AV block, 1st degree   . Bradycardia   . CAD (coronary artery disease)    Non ST elevation  2005 ; LHC in 6/05 in setting of NSTEMI: ant apical AK and inf-apical AK, EF 29%, dOM2 70-80% (small); findings c/w Tako-Tsubo CM  . Cardiomyopathy, nonischemic ([place]) 04/2008   Likely Tako-Tsubo. Resolved. --dx'd on cath 2005. EF 29% with minimal distal CAD (small OM1 70-80).  Echo 10/09 EF 55%;  echo 01/26/12: EF 55%, mild LAE, PASP 34.  . Colitis, ischemic ([place])   . Complication of anesthesia    post anesthesia excessive somnulence  . Compression fracture of C-spine ([place]) 10/10/2011   Fell at home, tx at [facility]  . Diarrhea associated with pseudomembranous colitis 6/11-17/2013   [facility]  . Dyslipidemia   . Fall    with non-healing rib fractures  . Glaucoma   . Granuloma annulare   . History of phlebitis   . Hypertension    SEVERE  . Idiopathic thrombocytopenic purpura (ITP) ([facility])   . Lower extremity edema   . Lumbar stenosis 2004   DR. MARK ROY  . Multiple pelvic fractures 05/2012   Dr Adaline Sill , [facility]  . Osteoporosis    Past Surgical History:  Procedure Laterality Date  . ABDOMINAL HYSTERECTOMY    . APPENDECTOMY    . BACK SURGERY  10/2004   LS Disc 4-5 no sx.  Marland Kitchen CARDIAC CATHETERIZATION     12/2003  . CATARACT EXTRACTION, BILATERAL    . COLONOSCOPY  09/2005   adenoma  . COLONOSCOPY W/ POLYPECTOMY    . 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 PACEMAKER IMPLANT N/A 11/06/2016   Procedure: Pacemaker Implant;  Surgeon: Evans Lance, MD;  Location: [facility];  Service: Cardiovascular;  Laterality: N/A;  . rectal bleed  05-01-06   colonoscopy-ischemic colitis  . ROTATOR CUFF REPAIR    . VERTEBROPLASTY     Dr Gladstone Lighter   Family History  Problem Relation Age of Onset  . Coronary artery disease Mother   . Heart failure Mother   . Renal cancer Mother   . Kidney disease Mother   . Stroke Father        in his 10s  . Colon cancer Father   . Breast cancer Sister         X 2  . Rectal cancer Sister   . Breast cancer Sister   . Diabetes Neg Hx    Social  History   Socioeconomic History  . Marital status: Widowed    Spouse name: Not on file  . Number of children: 1  . Years of education: Not on file  . Highest education level: Not on file  Occupational History  . Occupation: retired    Fish farm manager: RETIRED  Social Needs  . Financial resource strain: Not on file  . Food insecurity:    Worry: Not on file    Inability: Not on file  . Transportation needs:    Medical: Not on file    Non-medical: Not on file  Tobacco Use  . Smoking status: Never Smoker  . Smokeless tobacco: Never Used  Substance and Sexual Activity  . Alcohol use: No  . Drug use: No  . Sexual activity: Not on file  Lifestyle  . Physical activity:    Days per week: Not on file    Minutes per session: Not on file  . Stress: Not on file  Relationships  . Social connections:    Talks on phone: Not on file    Gets together: Not on file    Attends religious service: Not on file    Active member of club or organization: Not on file    Attends meetings of clubs or organizations: Not on file    Relationship status: Not on file  Other Topics Concern  . Not on file  Social History Narrative   Widowed   Lives by herself in a town house, drives    Son Florie Fry     Outpatient Encounter Medications as of 11/01/2017  Medication Sig  . benazepril (LOTENSIN) 20 MG tablet Take 1 tablet (20 mg total) by mouth daily. Take an extra 20 mg (1 tab) if your systolic (top number) of Blood Pressure is 180 or higher.  . beta carotene w/minerals (OCUVITE) tablet Take 1 tablet by mouth daily.   . brimonidine (ALPHAGAN) 0.2 % ophthalmic solution Place 1 drop into the right eye 2 (two) times daily.    . carboxymethylcellulose (REFRESH TEARS) 0.5 % SOLN Place 1 drop into both eyes 2 (two) times daily.   . carvedilol (COREG) 12.5 MG tablet Take 0.5 tablets (6.25 mg total) by mouth 2 (two) times daily.  . Cholecalciferol (VITAMIN D3) 3000 UNITS TABS Take 1 tablet by mouth daily.  .  diphenhydrAMINE (BENADRYL) 25 MG tablet Take 25 mg by mouth at bedtime as needed for sleep (and sinus issues).   . docusate sodium (COLACE) 100 MG capsule Take 100 mg by  mouth daily as needed for mild constipation. Stool softener   . furosemide (LASIX) 40 MG tablet Take 1 tablet (40 mg total) by mouth daily. Take an additional 20 mg (1/2 tab) if weight is 174 or more  . Misc Natural Products (OSTEO BI-FLEX ADV JOINT SHIELD) TABS Take 1 tablet by mouth daily.   Marland Kitchen omeprazole (PRILOSEC) 20 MG capsule Take 20 mg by mouth daily.    . Rivaroxaban (XARELTO) 15 MG TABS tablet Take 1 tablet (15 mg total) by mouth daily with supper.  . sodium chloride (OCEAN) 0.65 % SOLN nasal spray Place 1-2 sprays into both nostrils every 4 (four) hours as needed for congestion.  . timolol (TIMOPTIC) 0.25 % ophthalmic solution Place 1 drop into the right eye 2 (two) times daily.  . vitamin B-12 (CYANOCOBALAMIN) 1000 MCG tablet Take 2,000 mcg by mouth daily.   Marland Kitchen albuterol (VENTOLIN HFA) 108 (90 Base) MCG/ACT inhaler Inhale 2 puffs into the lungs every 6 (six) hours as needed for wheezing or shortness of breath. (Patient not taking: Reported on 11/01/2017)  . denosumab (PROLIA) 60 MG/ML SOLN injection Inject 60 mg into the skin every 6 (six) months. Administer in upper arm, thigh, or abdomen  . [DISCONTINUED] predniSONE (DELTASONE) 10 MG tablet 4 tablets x 2 days, 3 tabs x 2 days, 2 tabs x 2 days, 1 tab x 2 days   No facility-administered encounter medications on file as of 11/01/2017.     Activities of Daily Living In your present state of health, do you have any difficulty performing the following activities: 11/01/2017 11/07/2016  Hearing? N N  Vision? N N  Comment wears bifocals. Dr.Walter yearly. -  Difficulty concentrating or making decisions? N N  Walking or climbing stairs? N N  Dressing or bathing? N N  Doing errands, shopping? N N  Preparing Food and eating ? N -  Using the Toilet? N -  In the past six months,  have you accidently leaked urine? N -  Do you have problems with loss of bowel control? N -  Managing your Medications? N -  Managing your Finances? N -  Housekeeping or managing your Housekeeping? [place] keeper comes once per month. -  Some recent data might be hidden    Patient Care Team: Colon Branch, MD as PCP - General (Internal Medicine) Bensimhon, Shaune Pascal, MD as Attending Physician (Cardiology) Bobette Mo, MD as Referring Physician (Ophthalmology) Sandria Senter, FNP as Referring Physician (Orthopedic Surgery)    Assessment:   This is a routine wellness examination for [place]. Physical assessment deferred to PCP.   Exercise Activities and Dietary recommendations Current Exercise Habits: The patient does not participate in regular exercise at present Diet (meal preparation, eat out, water intake, caffeinated beverages, dairy products, fruits and vegetables): in general, a "healthy" diet  , well balanced       Goals    . Maintain current health and independence       Fall Risk Fall Risk  11/01/2017 07/19/2017 07/10/2016 01/06/2016 10/28/2015  Falls in the past year? No No No No No    Depression Screen PHQ 2/9 Scores 11/01/2017 07/19/2017 07/10/2016 01/06/2016  PHQ - 2 Score 0 0 0 0     Cognitive Function MMSE - Mini Mental State Exam 11/01/2017 07/10/2016  Orientation to time 5 5  Orientation to Place 5 5  Registration 3 3  Attention/ Calculation 5 5  Recall 3 3  Language- name 2  objects 2 2  Language- repeat 1 1  Language- follow 3 step command 3 3  Language- read & follow direction 1 1  Write a sentence 1 1  Copy design 1 1  Total score 30 30        Immunization History  Administered Date(s) Administered  . Influenza Split 04/12/2012, 03/05/2015  . Influenza Whole 05/04/1997, 03/13/2010  . Influenza, High Dose Seasonal PF 05/11/2016, 06/14/2017  . Influenza-Unspecified 03/13/2014  . Pneumococcal Conjugate-13 12/07/2014  .  Pneumococcal Polysaccharide-23 12/14/2007  . Td 07/14/2011  . Tdap 10/10/2011    Screening Tests Health Maintenance  Topic Date Due  . INFLUENZA VACCINE  02/10/2018  . TETANUS/TDAP  10/09/2021  . DEXA SCAN  Completed  . PNA vac Low Risk Adult  Completed      Plan:   Follow up with PCP as directed  Continue to eat heart healthy diet (full of fruits, vegetables, whole grains, lean protein, water--limit salt, fat, and sugar intake) and increase physical activity as tolerated.  Continue doing brain stimulating activities (puzzles, reading, adult coloring books, staying active) to keep memory sharp.     I have personally reviewed and noted the following in the patient's chart:   . Medical and social history . Use of alcohol, tobacco or illicit drugs  . Current medications and supplements . Functional ability and status . Nutritional status . Physical activity . Advanced directives . List of other physicians . Hospitalizations, surgeries, and ER visits in previous 12 months . Vitals . Screenings to include cognitive, depression, and falls . Referrals and appointments  In addition, I have reviewed and discussed with patient certain preventive protocols, quality metrics, and best practice recommendations. A written personalized care plan for preventive services as well as general preventive health recommendations were provided to patient.     Naaman Plummer [place], [place]  11/01/2017  Kathlene November, MD

## 2017-11-01 ENCOUNTER — Encounter: Payer: Self-pay | Admitting: *Deleted

## 2017-11-01 ENCOUNTER — Ambulatory Visit (INDEPENDENT_AMBULATORY_CARE_PROVIDER_SITE_OTHER): Payer: Medicare Other | Admitting: *Deleted

## 2017-11-01 VITALS — BP 140/80 | HR 84 | Ht 64.0 in | Wt 171.2 lb

## 2017-11-01 DIAGNOSIS — Z0000 Encounter for general adult medical examination without abnormal findings: Secondary | ICD-10-CM | POA: Diagnosis not present

## 2017-11-01 NOTE — Patient Instructions (Signed)
Continue to eat heart healthy diet (full of fruits, vegetables, whole grains, lean protein, water--limit salt, fat, and sugar intake) and increase physical activity as tolerated.  Continue doing brain stimulating activities (puzzles, reading, adult coloring books, staying active) to keep memory sharp.    [name] , Thank you for taking time to come for your Medicare Wellness Visit. I appreciate your ongoing commitment to your health goals. Please review the following plan we discussed and let me know if I can assist you in the future.   These are the goals we discussed: Goals    . Maintain current health and independence       This is a list of the screening recommended for you and due dates:  Health Maintenance  Topic Date Due  . Flu Shot  02/10/2018  . Tetanus Vaccine  10/09/2021  . DEXA scan (bone density measurement)  Completed  . Pneumonia vaccines  Completed    Health Maintenance for Postmenopausal Women Menopause is a normal process in which your reproductive ability comes to an end. This process happens gradually over a span of months to years, usually between the ages of 46 and 66. Menopause is complete when you have missed 12 consecutive menstrual periods. It is important to talk with your health care provider about some of the most common conditions that affect postmenopausal women, such as heart disease, cancer, and bone loss (osteoporosis). Adopting a healthy lifestyle and getting preventive care can help to promote your health and wellness. Those actions can also lower your chances of developing some of these common conditions. What should I know about menopause? During menopause, you may experience a number of symptoms, such as:  Moderate-to-severe hot flashes.  Night sweats.  Decrease in sex drive.  Mood swings.  Headaches.  Tiredness.  Irritability.  Memory problems.  Insomnia.  Choosing to treat or not to treat menopausal changes is an individual  decision that you make with your health care provider. What should I know about hormone replacement therapy and supplements? Hormone therapy products are effective for treating symptoms that are associated with menopause, such as hot flashes and night sweats. Hormone replacement carries certain risks, especially as you become older. If you are thinking about using estrogen or estrogen with progestin treatments, discuss the benefits and risks with your health care provider. What should I know about heart disease and stroke? Heart disease, heart attack, and stroke become more likely as you age. This may be due, in part, to the hormonal changes that your body experiences during menopause. These can affect how your body processes dietary fats, triglycerides, and cholesterol. Heart attack and stroke are both medical emergencies. There are many things that you can do to help prevent heart disease and stroke:  Have your blood pressure checked at least every 1-2 years. High blood pressure causes heart disease and increases the risk of stroke.  If you are 47-81 years old, ask your health care provider if you should take aspirin to prevent a heart attack or a stroke.  Do not use any tobacco products, including cigarettes, chewing tobacco, or electronic cigarettes. If you need help quitting, ask your health care provider.  It is important to eat a healthy diet and maintain a healthy weight. ? Be sure to include plenty of vegetables, fruits, low-fat dairy products, and lean protein. ? Avoid eating foods that are high in solid fats, added sugars, or salt (sodium).  Get regular exercise. This is one of the most important things  that you can do for your health. ? Try to exercise for at least 150 minutes each week. The type of exercise that you do should increase your heart rate and make you sweat. This is known as moderate-intensity exercise. ? Try to do strengthening exercises at least twice each week. Do these  in addition to the moderate-intensity exercise.  Know your numbers.Ask your health care provider to check your cholesterol and your blood glucose. Continue to have your blood tested as directed by your health care provider.  What should I know about cancer screening? There are several types of cancer. Take the following steps to reduce your risk and to catch any cancer development as early as possible. Breast Cancer  Practice breast self-awareness. ? This means understanding how your breasts normally appear and feel. ? It also means doing regular breast self-exams. Let your health care provider know about any changes, no matter how small.  If you are 27 or older, have a clinician do a breast exam (clinical breast exam or CBE) every year. Depending on your age, family history, and medical history, it may be recommended that you also have a yearly breast X-ray (mammogram).  If you have a family history of breast cancer, talk with your health care provider about genetic screening.  If you are at high risk for breast cancer, talk with your health care provider about having an MRI and a mammogram every year.  Breast cancer (BRCA) gene test is recommended for women who have family members with BRCA-related cancers. Results of the assessment will determine the need for genetic counseling and BRCA1 and for BRCA2 testing. BRCA-related cancers include these types: ? Breast. This occurs in males or females. ? Ovarian. ? Tubal. This may also be called fallopian tube cancer. ? Cancer of the abdominal or pelvic lining (peritoneal cancer). ? Prostate. ? Pancreatic.  Cervical, Uterine, and Ovarian Cancer Your health care provider may recommend that you be screened regularly for cancer of the pelvic organs. These include your ovaries, uterus, and vagina. This screening involves a pelvic exam, which includes checking for microscopic changes to the surface of your cervix (Pap test).  For women ages 21-65,  health care providers may recommend a pelvic exam and a Pap test every three years. For women ages 6-65, they may recommend the Pap test and pelvic exam, combined with testing for human papilloma virus (HPV), every five years. Some types of HPV increase your risk of cervical cancer. Testing for HPV may also be done on women of any age who have unclear Pap test results.  Other health care providers may not recommend any screening for nonpregnant women who are considered low risk for pelvic cancer and have no symptoms. Ask your health care provider if a screening pelvic exam is right for you.  If you have had past treatment for cervical cancer or a condition that could lead to cancer, you need Pap tests and screening for cancer for at least 20 years after your treatment. If Pap tests have been discontinued for you, your risk factors (such as having a new sexual partner) need to be reassessed to determine if you should start having screenings again. Some women have medical problems that increase the chance of getting cervical cancer. In these cases, your health care provider may recommend that you have screening and Pap tests more often.  If you have a family history of uterine cancer or ovarian cancer, talk with your health care provider about genetic screening.  If you have vaginal bleeding after reaching menopause, tell your health care provider.  There are currently no reliable tests available to screen for ovarian cancer.  Lung Cancer Lung cancer screening is recommended for adults 61-44 years old who are at high risk for lung cancer because of a history of smoking. A yearly low-dose CT scan of the lungs is recommended if you:  Currently smoke.  Have a history of at least 30 pack-years of smoking and you currently smoke or have quit within the past 15 years. A pack-year is smoking an average of one pack of cigarettes per day for one year.  Yearly screening should:  Continue until it has been  15 years since you quit.  Stop if you develop a health problem that would prevent you from having lung cancer treatment.  Colorectal Cancer  This type of cancer can be detected and can often be prevented.  Routine colorectal cancer screening usually begins at age 62 and continues through age 66.  If you have risk factors for colon cancer, your health care provider may recommend that you be screened at an earlier age.  If you have a family history of colorectal cancer, talk with your health care provider about genetic screening.  Your health care provider may also recommend using home test kits to check for hidden blood in your stool.  A small camera at the end of a tube can be used to examine your colon directly (sigmoidoscopy or colonoscopy). This is done to check for the earliest forms of colorectal cancer.  Direct examination of the colon should be repeated every 5-10 years until age 31. However, if early forms of precancerous polyps or small growths are found or if you have a family history or genetic risk for colorectal cancer, you may need to be screened more often.  Skin Cancer  Check your skin from head to toe regularly.  Monitor any moles. Be sure to tell your health care provider: ? About any new moles or changes in moles, especially if there is a change in a mole's shape or color. ? If you have a mole that is larger than the size of a pencil eraser.  If any of your family members has a history of skin cancer, especially at a young age, talk with your health care provider about genetic screening.  Always use sunscreen. Apply sunscreen liberally and repeatedly throughout the day.  Whenever you are outside, protect yourself by wearing long sleeves, pants, a wide-brimmed hat, and sunglasses.  What should I know about osteoporosis? Osteoporosis is a condition in which bone destruction happens more quickly than new bone creation. After menopause, you may be at an increased  risk for osteoporosis. To help prevent osteoporosis or the bone fractures that can happen because of osteoporosis, the following is recommended:  If you are 37-6 years old, get at least 1,000 mg of calcium and at least 600 mg of vitamin D per day.  If you are older than age 13 but younger than age 41, get at least 1,200 mg of calcium and at least 600 mg of vitamin D per day.  If you are older than age 46, get at least 1,200 mg of calcium and at least 800 mg of vitamin D per day.  Smoking and excessive alcohol intake increase the risk of osteoporosis. Eat foods that are rich in calcium and vitamin D, and do weight-bearing exercises several times each week as directed by your health care provider. What should  I know about how menopause affects my mental health? Depression may occur at any age, but it is more common as you become older. Common symptoms of depression include:  Low or sad mood.  Changes in sleep patterns.  Changes in appetite or eating patterns.  Feeling an overall lack of motivation or enjoyment of activities that you previously enjoyed.  Frequent crying spells.  Talk with your health care provider if you think that you are experiencing depression. What should I know about immunizations? It is important that you get and maintain your immunizations. These include:  Tetanus, diphtheria, and pertussis (Tdap) booster vaccine.  Influenza every year before the flu season begins.  Pneumonia vaccine.  Shingles vaccine.  Your health care provider may also recommend other immunizations. This information is not intended to replace advice given to you by your health care provider. Make sure you discuss any questions you have with your health care provider. Document Released: 08/21/2005 Document Revised: 01/17/2016 Document Reviewed: 04/02/2015 Elsevier Interactive Patient Education  2018 [employer].

## 2017-11-05 ENCOUNTER — Telehealth (HOSPITAL_COMMUNITY): Payer: Self-pay | Admitting: Pharmacist

## 2017-11-05 NOTE — Telephone Encounter (Signed)
[name] called asking about renewing her [employer] patient assistance application for her Xarelto which expires on 01/22/18. Generally, the patient assistance companies will not accept application renewal applications until about 30 days prior to expiration so will call her in early June to help her fill out the renewal application. Will also need a copy of her proof of income and a print out from her pharmacy showing what she has spent on her medications since 07/13/17. Patient agreeable with plan and grateful for the assistance.   Ruta Hinds. Velva Harman, PharmD, BCPS, CPP Clinical Pharmacist Phone: [phone] 11/05/2017 3:55 PM

## 2017-11-08 ENCOUNTER — Encounter: Payer: Self-pay | Admitting: Internal Medicine

## 2017-11-08 ENCOUNTER — Ambulatory Visit (INDEPENDENT_AMBULATORY_CARE_PROVIDER_SITE_OTHER): Payer: Medicare Other | Admitting: Internal Medicine

## 2017-11-08 VITALS — BP 168/100 | HR 105 | Ht 64.0 in | Wt 176.6 lb

## 2017-11-08 DIAGNOSIS — I471 Supraventricular tachycardia: Secondary | ICD-10-CM

## 2017-11-08 DIAGNOSIS — Z950 Presence of cardiac pacemaker: Secondary | ICD-10-CM

## 2017-11-08 DIAGNOSIS — I442 Atrioventricular block, complete: Secondary | ICD-10-CM | POA: Diagnosis not present

## 2017-11-08 DIAGNOSIS — I10 Essential (primary) hypertension: Secondary | ICD-10-CM

## 2017-11-08 LAB — CUP PACEART INCLINIC DEVICE CHECK
Battery Voltage: 2.99 V
Date Time Interrogation Session: 20190429153444
Implantable Lead Implant Date: 20180427
Implantable Lead Implant Date: 20180427
Implantable Pulse Generator Implant Date: 20180427
Lead Channel Impedance Value: 425 Ohm
Lead Channel Pacing Threshold Amplitude: 0.5 V
Lead Channel Pacing Threshold Amplitude: 0.5 V
Lead Channel Pacing Threshold Pulse Width: 0.5 ms
Lead Channel Pacing Threshold Pulse Width: 0.5 ms
Lead Channel Sensing Intrinsic Amplitude: 12 mV
Lead Channel Setting Pacing Amplitude: 2.5 V
Lead Channel Setting Sensing Sensitivity: 2 mV
MDC IDC LEAD LOCATION: 753859
MDC IDC LEAD LOCATION: 753860
MDC IDC MSMT BATTERY REMAINING LONGEVITY: 123 mo
MDC IDC MSMT LEADCHNL RA SENSING INTR AMPL: 0.6 mV
MDC IDC MSMT LEADCHNL RV IMPEDANCE VALUE: 525 Ohm
MDC IDC SET LEADCHNL RV PACING AMPLITUDE: 2.5 V
MDC IDC SET LEADCHNL RV PACING PULSEWIDTH: 0.5 ms
MDC IDC STAT BRADY RA PERCENT PACED: 2 %
MDC IDC STAT BRADY RV PERCENT PACED: 7.8 %
Pulse Gen Model: 2272
Pulse Gen Serial Number: 8900487

## 2017-11-08 MED ORDER — AMIODARONE HCL 200 MG PO TABS: 200.0000 mg | ORAL_TABLET | Freq: Every day | ORAL | 11 refills | Status: DC

## 2017-11-08 NOTE — Patient Instructions (Addendum)
Medication Instructions:  Your physician has recommended you make the following change in your medication:  1. Start taking amiodarone 200 mg - Take 1 tablet by mouth daily   Labwork: None ordered.  Testing/Procedures: None ordered.  Follow-Up: Your physician wants you to follow-up in: 6 weeks with Dr. Lovena Le.     Remote monitoring is used to monitor your Pacemaker from home. This monitoring reduces the number of office visits required to check your device to one time per year. It allows [place] to keep an eye on the functioning of your device to ensure it is working properly. You are scheduled for a device check from home on 11/09/2017. You may send your transmission at any time that day. If you have a wireless device, the transmission will be sent automatically. After your physician reviews your transmission, you will receive a postcard with your next transmission date.  Any Other Special Instructions Will Be Listed Below (If Applicable).  If you need a refill on your cardiac medications before your next appointment, please call your pharmacy.   Amiodarone tablets What is this medicine? AMIODARONE (a MEE oh da rone) is an antiarrhythmic drug. It helps make your heart beat regularly. Because of the side effects caused by this medicine, it is only used when other medicines have not worked. It is usually used for heartbeat problems that may be life threatening. This medicine may be used for other purposes; ask your health care provider or pharmacist if you have questions. COMMON BRAND NAME(S): Cordarone, Pacerone What should I tell my health care provider before I take this medicine? They need to know if you have any of these conditions: -liver disease -lung disease -other heart problems -thyroid disease -an unusual or allergic reaction to amiodarone, iodine, other medicines, foods, dyes, or preservatives -pregnant or trying to get pregnant -breast-feeding How should I use this  medicine? Take this medicine by mouth with a glass of water. Follow the directions on the prescription label. You can take this medicine with or without food. However, you should always take it the same way each time. Take your doses at regular intervals. Do not take your medicine more often than directed. Do not stop taking except on the advice of your doctor or health care professional. A special MedGuide will be given to you by the pharmacist with each prescription and refill. Be sure to read this information carefully each time. Talk to your pediatrician regarding the use of this medicine in children. Special care may be needed. Overdosage: If you think you have taken too much of this medicine contact a poison control center or emergency room at once. NOTE: This medicine is only for you. Do not share this medicine with others. What if I miss a dose? If you miss a dose, take it as soon as you can. If it is almost time for your next dose, take only that dose. Do not take double or extra doses. What may interact with this medicine? Do not take this medicine with any of the following medications: -abarelix -apomorphine -arsenic trioxide -certain antibiotics like erythromycin, gemifloxacin, levofloxacin, pentamidine -certain medicines for depression like amoxapine, tricyclic antidepressants -certain medicines for fungal infections like fluconazole, itraconazole, ketoconazole, posaconazole, voriconazole -certain medicines for irregular heart beat like disopyramide, dofetilide, dronedarone, ibutilide, propafenone, sotalol -certain medicines for malaria like chloroquine, halofantrine -cisapride -droperidol -haloperidol -hawthorn -maprotiline -methadone -phenothiazines like chlorpromazine, mesoridazine, thioridazine -pimozide -ranolazine -red yeast rice -vardenafil -ziprasidone This medicine may also interact with the following medications: -antiviral  medicines for HIV or AIDS -certain  medicines for blood pressure, heart disease, irregular heart beat -certain medicines for cholesterol like atorvastatin, cerivastatin, lovastatin, simvastatin -certain medicines for hepatitis C like sofosbuvir and ledipasvir; sofosbuvir -certain medicines for seizures like phenytoin -certain medicines for thyroid problems -certain medicines that treat or prevent blood clots like warfarin -cholestyramine -cimetidine -clopidogrel -cyclosporine -dextromethorphan -diuretics -fentanyl -general anesthetics -grapefruit juice -lidocaine -loratadine -methotrexate -other medicines that prolong the QT interval (cause an abnormal heart rhythm) -procainamide -quinidine -rifabutin, rifampin, or rifapentine -St. John's Wort -trazodone This list may not describe all possible interactions. Give your health care provider a list of all the medicines, herbs, non-prescription drugs, or dietary supplements you use. Also tell them if you smoke, drink alcohol, or use illegal drugs. Some items may interact with your medicine. What should I watch for while using this medicine? Your condition will be monitored closely when you first begin therapy. Often, this drug is first started in a hospital or other monitored health care setting. Once you are on maintenance therapy, visit your doctor or health care professional for regular checks on your progress. Because your condition and use of this medicine carry some risk, it is a good idea to carry an identification card, necklace or bracelet with details of your condition, medications, and doctor or health care professional. Dennis Bast may get drowsy or dizzy. Do not drive, use machinery, or do anything that needs mental alertness until you know how this medicine affects you. Do not stand or sit up quickly, especially if you are an older patient. This reduces the risk of dizzy or fainting spells. This medicine can make you more sensitive to the sun. Keep out of the sun. If you  cannot avoid being in the sun, wear protective clothing and use sunscreen. Do not use sun lamps or tanning beds/booths. You should have regular eye exams before and during treatment. Call your doctor if you have blurred vision, see halos, or your eyes become sensitive to light. Your eyes may get dry. It may be helpful to use a lubricating eye solution or artificial tears solution. If you are going to have surgery or a procedure that requires contrast dyes, tell your doctor or health care professional that you are taking this medicine. What side effects may I notice from receiving this medicine? Side effects that you should report to your doctor or health care professional as soon as possible: -allergic reactions like skin rash, itching or hives, swelling of the face, lips, or tongue -blue-gray coloring of the skin -blurred vision, seeing blue green halos, increased sensitivity of the eyes to light -breathing problems -chest pain -dark urine -fast, irregular heartbeat -feeling faint or light-headed -intolerance to heat or cold -nausea or vomiting -pain and swelling of the scrotum -pain, tingling, numbness in feet, hands -redness, blistering, peeling or loosening of the skin, including inside the mouth -spitting up blood -stomach pain -sweating -unusual or uncontrolled movements of body -unusually weak or tired -weight gain or loss -yellowing of the eyes or skin Side effects that usually do not require medical attention (report to your doctor or health care professional if they continue or are bothersome): -change in sex drive or performance -constipation -dizziness -headache -loss of appetite -trouble sleeping This list may not describe all possible side effects. Call your doctor for medical advice about side effects. You may report side effects to FDA at 1-800-FDA-1088. Where should I keep my medicine? Keep out of the reach of children. Store at room temperature  between 20 and 25  degrees C (68 and 77 degrees F). Protect from light. Keep container tightly closed. Throw away any unused medicine after the expiration date. NOTE: This sheet is a summary. It may not cover all possible information. If you have questions about this medicine, talk to your doctor, pharmacist, or health care provider.  2018 Elsevier/Gold Standard (2013-10-02 19:48:11)

## 2017-11-08 NOTE — Progress Notes (Signed)
HPI [name] returns today for followup of atrial arrhythmias and sinus node dysfunction s/p PPM insertion. She is a pleasant 84 yo woman with a h/o syncope and found to have sinus node dysfunction, s/p PPM insertion. She has had increasing fatigue, sob, and peripheral edema over the past 6 weeks which coincide with her having persistent atrial fib. No chest pain. She has occaisional palpitations.   Allergies  Allergen Reactions  . Morphine And Related Anaphylaxis and Nausea Only    Per family violently ill  . Oxycodone Other (See Comments)    Dizziness   . Penicillins     Joint edema Has patient had a PCN reaction causing immediate rash, facial/tongue/throat swelling, SOB or lightheadedness with hypotension: Yes Has patient had a PCN reaction causing severe rash involving mucus membranes or skin necrosis: No Has patient had a PCN reaction that required hospitalization: No Has patient had a PCN reaction occurring within the last 10 years: No If all of the above answers are "NO", then may proceed with Cephalosporin use.   . Colchicine Other (See Comments)    Hair loss  . Hydroxychloroquine Other (See Comments)    Unknown reaction  . Norvasc [Amlodipine Besylate]     Reaction not recalled by the patient  . Ofloxacin Other (See Comments)    Unknown   . Streptomycin Other (See Comments)    Unknown  . Tramadol Itching  . Alendronate Sodium Palpitations     Current Outpatient Medications  Medication Sig Dispense Refill  . benazepril (LOTENSIN) 20 MG tablet Take 1 tablet (20 mg total) by mouth daily. Take an extra 20 mg (1 tab) if your systolic (top number) of Blood Pressure is 180 or higher. 180 tablet 3  . beta carotene w/minerals (OCUVITE) tablet Take 1 tablet by mouth daily.     . brimonidine (ALPHAGAN) 0.2 % ophthalmic solution Place 1 drop into the right eye 2 (two) times daily.      . carboxymethylcellulose (REFRESH TEARS) 0.5 % SOLN Place 1 drop into both eyes 2  (two) times daily.     . carvedilol (COREG) 12.5 MG tablet Take 0.5 tablets (6.25 mg total) by mouth 2 (two) times daily. 90 tablet 2  . Cholecalciferol (VITAMIN D3) 3000 UNITS TABS Take 1 tablet by mouth daily.    . diphenhydrAMINE (BENADRYL) 25 MG tablet Take 25 mg by mouth at bedtime as needed for sleep (and sinus issues).     . docusate sodium (COLACE) 100 MG capsule Take 100 mg by mouth daily as needed for mild constipation. Stool softener     . furosemide (LASIX) 40 MG tablet Take 1 tablet (40 mg total) by mouth daily. Take an additional 20 mg (1/2 tab) if weight is 174 or more 215 tablet 3  . Misc Natural Products (OSTEO BI-FLEX ADV JOINT SHIELD) TABS Take 1 tablet by mouth daily.     Marland Kitchen omeprazole (PRILOSEC) 20 MG capsule Take 20 mg by mouth daily.      . Rivaroxaban (XARELTO) 15 MG TABS tablet Take 1 tablet (15 mg total) by mouth daily with supper. 30 tablet 11  . timolol (TIMOPTIC) 0.25 % ophthalmic solution Place 1 drop into the right eye 2 (two) times daily.    . vitamin B-12 (CYANOCOBALAMIN) 1000 MCG tablet Take 2,000 mcg by mouth daily.      No current facility-administered medications for this visit.      Past Medical History:  Diagnosis Date  . AV  block, 1st degree   . Bradycardia   . CAD (coronary artery disease)    Non ST elevation 2005 ; LHC in 6/05 in setting of NSTEMI: ant apical AK and inf-apical AK, EF 29%, dOM2 70-80% (small); findings c/w Tako-Tsubo CM  . Cardiomyopathy, nonischemic ([place]) 04/2008   Likely Tako-Tsubo. Resolved. --dx'd on cath 2005. EF 29% with minimal distal CAD (small OM1 70-80).  Echo 10/09 EF 55%;  echo 01/26/12: EF 55%, mild LAE, PASP 34.  . Colitis, ischemic ([place])   . Complication of anesthesia    post anesthesia excessive somnulence  . Compression fracture of C-spine ([place]) 10/10/2011   Fell at home, tx at [facility]  . Diarrhea associated with pseudomembranous colitis 6/11-17/2013   [facility]  . Dyslipidemia   . Fall    with non-healing  rib fractures  . Glaucoma   . Granuloma annulare   . History of phlebitis   . Hypertension    SEVERE  . Idiopathic thrombocytopenic purpura (ITP) (HCC)   . Lower extremity edema   . Lumbar stenosis 2004   DR. MARK ROY  . Multiple pelvic fractures 05/2012   Dr Adaline Sill , [facility]  . Osteoporosis     ROS:   All systems reviewed and negative except as noted in the HPI.   Past Surgical History:  Procedure Laterality Date  . ABDOMINAL HYSTERECTOMY    . APPENDECTOMY    . BACK SURGERY  10/2004   LS Disc 4-5 no sx.  Marland Kitchen CARDIAC CATHETERIZATION     12/2003  . CATARACT EXTRACTION, BILATERAL    . COLONOSCOPY  09/2005   adenoma  . COLONOSCOPY W/ POLYPECTOMY    . 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 PACEMAKER IMPLANT N/A 11/06/2016   Procedure: Pacemaker Implant;  Surgeon: Evans Lance, MD;  Location: [place] [facility];  Service: Cardiovascular;  Laterality: N/A;  . rectal bleed  05-01-06   colonoscopy-ischemic colitis  . ROTATOR CUFF REPAIR    . VERTEBROPLASTY     Dr Gladstone Lighter     Family History  Problem Relation Age of Onset  . Coronary artery disease Mother   . Heart failure Mother   . Renal cancer Mother   . Kidney disease Mother   . Stroke Father        in his 67s  . Colon cancer Father   . Breast cancer Sister         X 2  . Rectal cancer Sister   . Breast cancer Sister   . Diabetes Neg Hx      Social History   Socioeconomic History  . Marital status: Widowed    Spouse name: Not on file  . Number of children: 1  . Years of education: Not on file  . Highest education level: Not on file  Occupational History  . Occupation: retired    Fish farm manager: RETIRED  Social Needs  . Financial resource strain: Not on file  . Food insecurity:    Worry: Not on file    Inability: Not on file  . Transportation needs:    Medical: Not on file    Non-medical: Not on file  Tobacco Use  . Smoking status: Never Smoker  . Smokeless tobacco: Never Used  Substance and  Sexual Activity  . Alcohol use: No  . Drug use: No  . Sexual activity: Not on file  Lifestyle  . Physical activity:    Days per week: Not on file  Minutes per session: Not on file  . Stress: Not on file  Relationships  . Social connections:    Talks on phone: Not on file    Gets together: Not on file    Attends religious service: Not on file    Active member of club or organization: Not on file    Attends meetings of clubs or organizations: Not on file    Relationship status: Not on file  . Intimate partner violence:    Fear of current or ex partner: Not on file    Emotionally abused: Not on file    Physically abused: Not on file    Forced sexual activity: Not on file  Other Topics Concern  . Not on file  Social History Narrative   Widowed   Lives by herself in a town house, drives    Son Liddy Deam      BP (!) 168/100   Pulse (!) 105   Ht 5\' 4"  (1.626 m)   Wt 176 lb 9.6 oz (80.1 kg)   SpO2 99%   BMI 30.31 kg/m   Physical Exam:  Well appearing elderly woman, NAD HEENT: Unremarkable Neck:  6 cm JVD, no thyromegally Lymphatics:  No adenopathy Back:  No CVA tenderness Lungs:  Clear HEART:  IRegular rate rhythm, no murmurs, no rubs, no clicks Abd:  soft, positive bowel sounds, no organomegally, no rebound, no guarding Ext:  2 plus pulses, no edema, no cyanosis, no clubbing Skin:  No rashes no nodules Neuro:  CN II through XII intact, motor grossly intact  EKG - atrial fib with a RVR  DEVICE  Normal device function.  See PaceArt for details.   Assess/Plan: 1. Atrial fib - she has had a gradual worsening and mostly been out of rhythm for 6-8 weeks. I have discussed the options and recommended a trial of amiodarone.  2. HTN - her blood pressure is not well controlled will follow. 3. PPM - interogation of her [employer] DDD PM demonstrates normal device function. 4. Chronic diastolic heart failure - her symptoms have gone from class 1 to 2 over the past 2  months.  We will tryn and get her back to [facility].  [name].D.

## 2017-11-09 ENCOUNTER — Ambulatory Visit (INDEPENDENT_AMBULATORY_CARE_PROVIDER_SITE_OTHER): Payer: Medicare Other | Admitting: *Deleted

## 2017-11-09 DIAGNOSIS — I442 Atrioventricular block, complete: Secondary | ICD-10-CM | POA: Diagnosis not present

## 2017-11-09 NOTE — Progress Notes (Signed)
Remote pacemaker transmission.   

## 2017-11-10 ENCOUNTER — Encounter: Payer: Self-pay | Admitting: Cardiology

## 2017-11-11 ENCOUNTER — Ambulatory Visit (HOSPITAL_COMMUNITY)
Admission: RE | Admit: 2017-11-11 | Discharge: 2017-11-11 | Disposition: A | Discharge status: Home / Self Care | Payer: Medicare Other | Source: Ambulatory Visit | Attending: Nurse Practitioner | Admitting: Nurse Practitioner

## 2017-11-11 ENCOUNTER — Telehealth: Payer: Self-pay | Admitting: Internal Medicine

## 2017-11-11 ENCOUNTER — Encounter (HOSPITAL_COMMUNITY): Payer: Self-pay | Admitting: Nurse Practitioner

## 2017-11-11 VITALS — BP 138/92 | HR 97 | Ht 64.0 in | Wt 178.0 lb

## 2017-11-11 DIAGNOSIS — Z950 Presence of cardiac pacemaker: Secondary | ICD-10-CM | POA: Diagnosis not present

## 2017-11-11 DIAGNOSIS — I252 Old myocardial infarction: Secondary | ICD-10-CM | POA: Diagnosis not present

## 2017-11-11 DIAGNOSIS — I110 Hypertensive heart disease with heart failure: Secondary | ICD-10-CM | POA: Diagnosis not present

## 2017-11-11 DIAGNOSIS — I480 Paroxysmal atrial fibrillation: Secondary | ICD-10-CM | POA: Insufficient documentation

## 2017-11-11 DIAGNOSIS — Z885 Allergy status to narcotic agent status: Secondary | ICD-10-CM | POA: Diagnosis not present

## 2017-11-11 DIAGNOSIS — I481 Persistent atrial fibrillation: Secondary | ICD-10-CM | POA: Diagnosis not present

## 2017-11-11 DIAGNOSIS — Z881 Allergy status to other antibiotic agents status: Secondary | ICD-10-CM | POA: Insufficient documentation

## 2017-11-11 DIAGNOSIS — Z7901 Long term (current) use of anticoagulants: Secondary | ICD-10-CM | POA: Insufficient documentation

## 2017-11-11 DIAGNOSIS — Z880 Allergy status to penicillin: Secondary | ICD-10-CM | POA: Diagnosis not present

## 2017-11-11 DIAGNOSIS — H409 Unspecified glaucoma: Secondary | ICD-10-CM | POA: Diagnosis not present

## 2017-11-11 DIAGNOSIS — I4891 Unspecified atrial fibrillation: Secondary | ICD-10-CM | POA: Diagnosis present

## 2017-11-11 DIAGNOSIS — I2510 Atherosclerotic heart disease of native coronary artery without angina pectoris: Secondary | ICD-10-CM | POA: Insufficient documentation

## 2017-11-11 DIAGNOSIS — I509 Heart failure, unspecified: Secondary | ICD-10-CM | POA: Diagnosis not present

## 2017-11-11 DIAGNOSIS — E785 Hyperlipidemia, unspecified: Secondary | ICD-10-CM | POA: Insufficient documentation

## 2017-11-11 DIAGNOSIS — Z888 Allergy status to other drugs, medicaments and biological substances status: Secondary | ICD-10-CM | POA: Insufficient documentation

## 2017-11-11 DIAGNOSIS — I4819 Other persistent atrial fibrillation: Secondary | ICD-10-CM

## 2017-11-11 DIAGNOSIS — Z79899 Other long term (current) drug therapy: Secondary | ICD-10-CM | POA: Diagnosis not present

## 2017-11-11 DIAGNOSIS — I428 Other cardiomyopathies: Secondary | ICD-10-CM | POA: Diagnosis not present

## 2017-11-11 MED ORDER — CARVEDILOL 12.5 MG PO TABS: 12.5000 mg | ORAL_TABLET | Freq: Two times a day (BID) | ORAL | 2 refills | Status: DC

## 2017-11-11 NOTE — Patient Instructions (Signed)
Coreg 12.5 mg twice a day.

## 2017-11-11 NOTE — Telephone Encounter (Signed)
Reviewed information with Myrtie Hawk, RN would advises pt should be seem in the At [facility] clinic or report to the ED.  Spoke with patient who is agreeable to an appt in the At Fib today at 2 pm.  She is familiar with where the clinic is located and was given the code to the parking deck.  She will c/b prior to the appt if further needs/concerns.

## 2017-11-11 NOTE — Progress Notes (Signed)
Primary Care Physician: Colon Branch, MD Referring Physician: [facility] EP: [name] is a 84 y.o. female with a h/o PPM, CAD,HTN, afib/atrail arrythmia's and CHF, that recently saw [name] and interrogation showed persistent afib x 6-8 weeks, with pt relaying symptoms of shortness of breath and peripheral edema over that period of time. He felt she would do better in SR and started amiodarone 200 mg one a day. The pt called the [facility] street office this am not feeling weill with sleeping poorly last night with itching and nervousness. She took the pill  for 3 days and felt horrible so did not take today. She did not take her lasix yesterday as well  as today. Her weight is up 2-3 lbs from a few days ago. She feels better this pm and did walk into the office without any shortness of breath noted.  Today, she denies symptoms of   chest pain,   orthopnea, PND, lower extremity edema, dizziness, presyncope, syncope, or neurologic sequela. + for nervousness, shortness of breath, pedal edema.The patient is tolerating medications without difficulties and is otherwise without complaint today.   Past Medical History:  Diagnosis Date  . AV block, 1st degree   . Bradycardia   . CAD (coronary artery disease)    Non ST elevation 2005 ; LHC in 6/05 in setting of NSTEMI: ant apical AK and inf-apical AK, EF 29%, dOM2 70-80% (small); findings c/w Tako-Tsubo CM  . Cardiomyopathy, nonischemic ([place]) 04/2008   Likely Tako-Tsubo. Resolved. --dx'd on cath 2005. EF 29% with minimal distal CAD (small OM1 70-80).  Echo 10/09 EF 55%;  echo 01/26/12: EF 55%, mild LAE, PASP 34.  . Colitis, ischemic ([place])   . Complication of anesthesia    post anesthesia excessive somnulence  . Compression fracture of C-spine ([place]) 10/10/2011   Fell at home, tx at [facility]  . Diarrhea associated with pseudomembranous colitis 6/11-17/2013   [facility]  . Dyslipidemia   . Fall    with non-healing rib  fractures  . Glaucoma   . Granuloma annulare   . History of phlebitis   . Hypertension    SEVERE  . Idiopathic thrombocytopenic purpura (ITP) (HCC)   . Lower extremity edema   . Lumbar stenosis 2004   DR. MARK ROY  . Multiple pelvic fractures 05/2012   Dr Adaline Sill , [facility]  . Osteoporosis    Past Surgical History:  Procedure Laterality Date  . ABDOMINAL HYSTERECTOMY    . APPENDECTOMY    . BACK SURGERY  10/2004   LS Disc 4-5 no sx.  Marland Kitchen CARDIAC CATHETERIZATION     12/2003  . CATARACT EXTRACTION, BILATERAL    . COLONOSCOPY  09/2005   adenoma  . COLONOSCOPY W/ POLYPECTOMY    . 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 PACEMAKER IMPLANT N/A 11/06/2016   Procedure: Pacemaker Implant;  Surgeon: Evans Lance, MD;  Location: [place] [facility];  Service: Cardiovascular;  Laterality: N/A;  . rectal bleed  05-01-06   colonoscopy-ischemic colitis  . ROTATOR CUFF REPAIR    . VERTEBROPLASTY     Dr Gladstone Lighter    Current Outpatient Medications  Medication Sig Dispense Refill  . benazepril (LOTENSIN) 20 MG tablet Take 1 tablet (20 mg total) by mouth daily. Take an extra 20 mg (1 tab) if your systolic (top number) of Blood Pressure is 180 or higher. 180 tablet 3  . beta carotene w/minerals (OCUVITE)  tablet Take 1 tablet by mouth daily.     . brimonidine (ALPHAGAN) 0.2 % ophthalmic solution Place 1 drop into the right eye 2 (two) times daily.      . carboxymethylcellulose (REFRESH TEARS) 0.5 % SOLN Place 1 drop into both eyes 2 (two) times daily.     . carvedilol (COREG) 12.5 MG tablet Take 1 tablet (12.5 mg total) by mouth 2 (two) times daily. 180 tablet 2  . Cholecalciferol (VITAMIN D3) 3000 UNITS TABS Take 1 tablet by mouth daily.    . diphenhydrAMINE (BENADRYL) 25 MG tablet Take 25 mg by mouth at bedtime as needed for sleep (and sinus issues).     . docusate sodium (COLACE) 100 MG capsule Take 100 mg by mouth daily as needed for mild constipation. Stool softener     . furosemide (LASIX) 40  MG tablet Take 1 tablet (40 mg total) by mouth daily. Take an additional 20 mg (1/2 tab) if weight is 174 or more 215 tablet 3  . Misc Natural Products (OSTEO BI-FLEX ADV JOINT SHIELD) TABS Take 1 tablet by mouth daily.     Marland Kitchen omeprazole (PRILOSEC) 20 MG capsule Take 20 mg by mouth daily.      . Rivaroxaban (XARELTO) 15 MG TABS tablet Take 1 tablet (15 mg total) by mouth daily with supper. 30 tablet 11  . timolol (TIMOPTIC) 0.25 % ophthalmic solution Place 1 drop into the right eye 2 (two) times daily.    . vitamin B-12 (CYANOCOBALAMIN) 1000 MCG tablet Take 2,000 mcg by mouth daily.      No current facility-administered medications for this encounter.     Allergies  Allergen Reactions  . Morphine And Related Anaphylaxis and Nausea Only    Per family violently ill  . Oxycodone Other (See Comments)    Dizziness   . Penicillins     Joint edema Has patient had a PCN reaction causing immediate rash, facial/tongue/throat swelling, SOB or lightheadedness with hypotension: Yes Has patient had a PCN reaction causing severe rash involving mucus membranes or skin necrosis: No Has patient had a PCN reaction that required hospitalization: No Has patient had a PCN reaction occurring within the last 10 years: No If all of the above answers are "NO", then may proceed with Cephalosporin use.   . Amiodarone     Possible rash and nervousness per patient  . Colchicine Other (See Comments)    Hair loss  . Hydroxychloroquine Other (See Comments)    Unknown reaction  . Norvasc [Amlodipine Besylate]     Reaction not recalled by the patient  . Ofloxacin Other (See Comments)    Unknown   . Streptomycin Other (See Comments)    Unknown  . Tramadol Itching  . Alendronate Sodium Palpitations    Social History   Socioeconomic History  . Marital status: Widowed    Spouse name: Not on file  . Number of children: 1  . Years of education: Not on file  . Highest education level: Not on file  Occupational  History  . Occupation: retired    Fish farm manager: RETIRED  Social Needs  . Financial resource strain: Not on file  . Food insecurity:    Worry: Not on file    Inability: Not on file  . Transportation needs:    Medical: Not on file    Non-medical: Not on file  Tobacco Use  . Smoking status: Never Smoker  . Smokeless tobacco: Never Used  Substance and Sexual Activity  .  Alcohol use: No  . Drug use: No  . Sexual activity: Not on file  Lifestyle  . Physical activity:    Days per week: Not on file    Minutes per session: Not on file  . Stress: Not on file  Relationships  . Social connections:    Talks on phone: Not on file    Gets together: Not on file    Attends religious service: Not on file    Active member of club or organization: Not on file    Attends meetings of clubs or organizations: Not on file    Relationship status: Not on file  . Intimate partner violence:    Fear of current or ex partner: Not on file    Emotionally abused: Not on file    Physically abused: Not on file    Forced sexual activity: Not on file  Other Topics Concern  . Not on file  Social History Narrative   Widowed   Lives by herself in a town house, drives    Son [name]     Family History  Problem Relation Age of Onset  . Coronary artery disease Mother   . Heart failure Mother   . Renal cancer Mother   . Kidney disease Mother   . Stroke Father        in his 9s  . Colon cancer Father   . Breast cancer Sister         X 2  . Rectal cancer Sister   . Breast cancer Sister   . Diabetes Neg Hx     ROS- All systems are reviewed and negative except as per the HPI above  Physical Exam: Vitals:   11/11/17 1410  BP: (!) 138/92  Pulse: 97  Weight: 178 lb (80.7 kg)  Height: 5\' 4"  (1.626 m)   Wt Readings from Last 3 Encounters:  11/11/17 178 lb (80.7 kg)  11/08/17 176 lb 9.6 oz (80.1 kg)  11/01/17 171 lb 3.2 oz (77.7 kg)    Labs: Lab Results  Component Value Date   NA 142  06/14/2017   K 4.2 06/14/2017   CL 107 06/14/2017   CO2 25 06/14/2017   GLUCOSE 106 (H) 06/14/2017   BUN 25 (H) 06/14/2017   CREATININE 1.47 (H) 06/14/2017   CALCIUM 9.4 06/14/2017   PHOS 2.8 08/12/2015   MG 1.7 11/06/2016   Lab Results  Component Value Date   INR 1.07 11/06/2016   Lab Results  Component Value Date   CHOL  12/14/2007    112        ATP III CLASSIFICATION:  <200     mg/dL   Desirable  200-239  mg/dL   Borderline High  >=240    mg/dL   High   HDL 24 (L) 12/14/2007   LDLCALC  12/14/2007    69        Total Cholesterol/HDL:CHD Risk Coronary Heart Disease Risk Table                     Men   Women  1/2 Average Risk   3.4   3.3   TRIG 97 12/14/2007     GEN- The patient is well appearing, alert and oriented x 3 today.   Head- normocephalic, atraumatic Eyes-  Sclera clear, conjunctiva pink Ears- hearing intact Oropharynx- clear Neck- supple, no JVP Lymph- no cervical lymphadenopathy Lungs- Clear to ausculation bilaterally, normal work of breathing Heart- irregular rate and rhythm,  no murmurs, rubs or gallops, PMI not laterally displaced GI- soft, NT, ND, + BS Extremities- no clubbing, cyanosis, or edema MS- no significant deformity or atrophy Skin- no rash or lesion Psych- euthymic mood, full affect Neuro- strength and sensation are intact  EKG- afib at 97 bpm, qrs int 86 ms, qtc 469 ms Epic records reviewed  Assessment and Plan: 1. Persistent  afib She did not tolerate amiodarone and  has stopped drug Contributing to her symptoms, she has not taken lasix x 2 days for reasons unclear, but she will go home and get back on this today, weight is up a few lbs, feels better this pm Will try to rate control her little better as I don't have any good options to restore SR Increase coreg to 12.5 mg bid, currently is taking 6.25 mg bid  I will see her back in one week  Butch Penny C. Andriel Omalley, [facility] [address] [place], [place] [address] [phone]

## 2017-11-11 NOTE — Telephone Encounter (Signed)
Pt seen by afib clinic.  No further action needed at this time.

## 2017-11-11 NOTE — Telephone Encounter (Signed)
New Message   Pt c/o Shortness Of Breath: STAT if SOB developed within the last 24 hours or pt is noticeably SOB on the phone  1. Are you currently SOB (can you hear that pt is SOB on the phone)? yes  2. How long have you been experiencing SOB? Last night  3. Are you SOB when sitting or when up moving around? both  4. Are you currently experiencing any other symptoms? Heart flutters

## 2017-11-11 NOTE — Telephone Encounter (Signed)
Spoke with patient who is very obviously SOB on the phone.  This started last night.  She feels a fluttering in her chest, weakness and nervousness. She is unable to check her HR or BP at this time and doesn't know what they are. She also have bilateral edema at feet and ankles.  She has not been weighing on a daily basis.   Was recently seen by Dr Lovena Le who started her on Amiodarone (4/29).  Will review with Dr Lovena Le and his nurse and c/b with instructions.

## 2017-11-17 ENCOUNTER — Ambulatory Visit (HOSPITAL_COMMUNITY)
Admission: RE | Admit: 2017-11-17 | Discharge: 2017-11-17 | Disposition: A | Discharge status: Home / Self Care | Payer: Medicare Other | Source: Ambulatory Visit | Attending: Nurse Practitioner | Admitting: Nurse Practitioner

## 2017-11-17 ENCOUNTER — Encounter (HOSPITAL_COMMUNITY): Payer: Self-pay | Admitting: Nurse Practitioner

## 2017-11-17 VITALS — BP 132/84 | HR 95 | Ht 64.0 in | Wt 170.0 lb

## 2017-11-17 DIAGNOSIS — I2510 Atherosclerotic heart disease of native coronary artery without angina pectoris: Secondary | ICD-10-CM | POA: Insufficient documentation

## 2017-11-17 DIAGNOSIS — Z881 Allergy status to other antibiotic agents status: Secondary | ICD-10-CM | POA: Diagnosis not present

## 2017-11-17 DIAGNOSIS — Z880 Allergy status to penicillin: Secondary | ICD-10-CM | POA: Insufficient documentation

## 2017-11-17 DIAGNOSIS — I110 Hypertensive heart disease with heart failure: Secondary | ICD-10-CM | POA: Diagnosis not present

## 2017-11-17 DIAGNOSIS — Z79899 Other long term (current) drug therapy: Secondary | ICD-10-CM | POA: Diagnosis not present

## 2017-11-17 DIAGNOSIS — Z7901 Long term (current) use of anticoagulants: Secondary | ICD-10-CM | POA: Insufficient documentation

## 2017-11-17 DIAGNOSIS — I481 Persistent atrial fibrillation: Secondary | ICD-10-CM | POA: Diagnosis not present

## 2017-11-17 DIAGNOSIS — I509 Heart failure, unspecified: Secondary | ICD-10-CM | POA: Diagnosis not present

## 2017-11-17 DIAGNOSIS — I480 Paroxysmal atrial fibrillation: Secondary | ICD-10-CM | POA: Diagnosis not present

## 2017-11-17 DIAGNOSIS — Z885 Allergy status to narcotic agent status: Secondary | ICD-10-CM | POA: Insufficient documentation

## 2017-11-17 DIAGNOSIS — Z888 Allergy status to other drugs, medicaments and biological substances status: Secondary | ICD-10-CM | POA: Diagnosis not present

## 2017-11-17 DIAGNOSIS — Z950 Presence of cardiac pacemaker: Secondary | ICD-10-CM | POA: Insufficient documentation

## 2017-11-17 DIAGNOSIS — I428 Other cardiomyopathies: Secondary | ICD-10-CM | POA: Insufficient documentation

## 2017-11-17 DIAGNOSIS — I252 Old myocardial infarction: Secondary | ICD-10-CM | POA: Diagnosis not present

## 2017-11-17 DIAGNOSIS — E785 Hyperlipidemia, unspecified: Secondary | ICD-10-CM | POA: Insufficient documentation

## 2017-11-17 DIAGNOSIS — I4819 Other persistent atrial fibrillation: Secondary | ICD-10-CM

## 2017-11-17 DIAGNOSIS — I4891 Unspecified atrial fibrillation: Secondary | ICD-10-CM | POA: Diagnosis present

## 2017-11-17 NOTE — Progress Notes (Signed)
Primary Care Physician: Colon Branch, MD Referring Physician: [facility] EP: Dr. Tresa Garter is a 84 y.o. female with a h/o PPM, CAD,HTN, afib/atrail arrythmia's and CHF, that recently saw Dr. Lovena Le and interrogation showed persistent afib x 6-8 weeks, with pt relaying symptoms of shortness of breath and peripheral edema over that period of time. He felt she would do better in SR and started amiodarone 200 mg one a day. The pt called the [facility] street office this am not feeling weill with sleeping poorly last night with itching and nervousness. She took the pill  for 3 days and felt horrible so did not take today. She did not take her lasix yesterday as well  as today. Her weight is up 2-3 lbs from a few days ago. She feels better this pm and did walk into the office without any shortness of breath noted.  F/u in afib clinic, 5/8, she misunderstood and did not increase coreg to 12.5 mg bid. She did get back on her lasix and her weight is down 8 lbs and she feels better. Afib with v rate of 95 bpm, and BP at 132/84 her but most of readings at home are in the 150 sys range.Her LLE is much improved.  Today, she denies symptoms of   chest pain,   orthopnea, PND, lower extremity edema, dizziness, presyncope, syncope, or neurologic sequela. The patient is tolerating medications without difficulties and is otherwise without complaint today.   Past Medical History:  Diagnosis Date  . AV block, 1st degree   . Bradycardia   . CAD (coronary artery disease)    Non ST elevation 2005 ; LHC in 6/05 in setting of NSTEMI: ant apical AK and inf-apical AK, EF 29%, dOM2 70-80% (small); findings c/w Tako-Tsubo CM  . Cardiomyopathy, nonischemic ([place]) 04/2008   Likely Tako-Tsubo. Resolved. --dx'd on cath 2005. EF 29% with minimal distal CAD (small OM1 70-80).  Echo 10/09 EF 55%;  echo 01/26/12: EF 55%, mild LAE, PASP 34.  . Colitis, ischemic ([place])   . Complication of anesthesia    post  anesthesia excessive somnulence  . Compression fracture of C-spine ([place]) 10/10/2011   Fell at home, tx at [facility]  . Diarrhea associated with pseudomembranous colitis 6/11-17/2013   [facility]  . Dyslipidemia   . Fall    with non-healing rib fractures  . Glaucoma   . Granuloma annulare   . History of phlebitis   . Hypertension    SEVERE  . Idiopathic thrombocytopenic purpura (ITP) (HCC)   . Lower extremity edema   . Lumbar stenosis 2004   DR. MARK ROY  . Multiple pelvic fractures 05/2012   Dr Adaline Sill , [facility]  . Osteoporosis    Past Surgical History:  Procedure Laterality Date  . ABDOMINAL HYSTERECTOMY    . APPENDECTOMY    . BACK SURGERY  10/2004   LS Disc 4-5 no sx.  Marland Kitchen CARDIAC CATHETERIZATION     12/2003  . CATARACT EXTRACTION, BILATERAL    . COLONOSCOPY  09/2005   adenoma  . COLONOSCOPY W/ POLYPECTOMY    . 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 PACEMAKER IMPLANT N/A 11/06/2016   Procedure: Pacemaker Implant;  Surgeon: Evans Lance, MD;  Location: [place] [facility];  Service: Cardiovascular;  Laterality: N/A;  . rectal bleed  05-01-06   colonoscopy-ischemic colitis  . ROTATOR CUFF REPAIR    . VERTEBROPLASTY     Dr  Gioffre    Current Outpatient Medications  Medication Sig Dispense Refill  . benazepril (LOTENSIN) 20 MG tablet Take 1 tablet (20 mg total) by mouth daily. Take an extra 20 mg (1 tab) if your systolic (top number) of Blood Pressure is 180 or higher. 180 tablet 3  . beta carotene w/minerals (OCUVITE) tablet Take 1 tablet by mouth daily.     . brimonidine (ALPHAGAN) 0.2 % ophthalmic solution Place 1 drop into the right eye 2 (two) times daily.      . carboxymethylcellulose (REFRESH TEARS) 0.5 % SOLN Place 1 drop into both eyes 2 (two) times daily.     . carvedilol (COREG) 12.5 MG tablet Take 1 tablet (12.5 mg total) by mouth 2 (two) times daily. (Patient taking differently: Take 6.25 mg by mouth 2 (two) times daily. ) 180 tablet 2  . Cholecalciferol  (VITAMIN D3) 3000 UNITS TABS Take 1 tablet by mouth daily.    . diphenhydrAMINE (BENADRYL) 25 MG tablet Take 25 mg by mouth at bedtime as needed for sleep (and sinus issues).     . docusate sodium (COLACE) 100 MG capsule Take 100 mg by mouth daily as needed for mild constipation. Stool softener     . furosemide (LASIX) 40 MG tablet Take 1 tablet (40 mg total) by mouth daily. Take an additional 20 mg (1/2 tab) if weight is 174 or more 215 tablet 3  . Misc Natural Products (OSTEO BI-FLEX ADV JOINT SHIELD) TABS Take 1 tablet by mouth daily.     Marland Kitchen omeprazole (PRILOSEC) 20 MG capsule Take 20 mg by mouth daily.      . Rivaroxaban (XARELTO) 15 MG TABS tablet Take 1 tablet (15 mg total) by mouth daily with supper. 30 tablet 11  . timolol (TIMOPTIC) 0.25 % ophthalmic solution Place 1 drop into the right eye 2 (two) times daily.    . vitamin B-12 (CYANOCOBALAMIN) 1000 MCG tablet Take 2,000 mcg by mouth daily.      No current facility-administered medications for this encounter.     Allergies  Allergen Reactions  . Morphine And Related Anaphylaxis and Nausea Only    Per family violently ill  . Oxycodone Other (See Comments)    Dizziness   . Penicillins     Joint edema Has patient had a PCN reaction causing immediate rash, facial/tongue/throat swelling, SOB or lightheadedness with hypotension: Yes Has patient had a PCN reaction causing severe rash involving mucus membranes or skin necrosis: No Has patient had a PCN reaction that required hospitalization: No Has patient had a PCN reaction occurring within the last 10 years: No If all of the above answers are "NO", then may proceed with Cephalosporin use.   . Amiodarone     Possible rash and nervousness per patient  . Colchicine Other (See Comments)    Hair loss  . Hydroxychloroquine Other (See Comments)    Unknown reaction  . Norvasc [Amlodipine Besylate]     Reaction not recalled by the patient  . Ofloxacin Other (See Comments)    Unknown     . Streptomycin Other (See Comments)    Unknown  . Tramadol Itching  . Alendronate Sodium Palpitations    Social History   Socioeconomic History  . Marital status: Widowed    Spouse name: Not on file  . Number of children: 1  . Years of education: Not on file  . Highest education level: Not on file  Occupational History  . Occupation: retired    Fish farm manager:  RETIRED  Social Needs  . Financial resource strain: Not on file  . Food insecurity:    Worry: Not on file    Inability: Not on file  . Transportation needs:    Medical: Not on file    Non-medical: Not on file  Tobacco Use  . Smoking status: Never Smoker  . Smokeless tobacco: Never Used  Substance and Sexual Activity  . Alcohol use: No  . Drug use: No  . Sexual activity: Not on file  Lifestyle  . Physical activity:    Days per week: Not on file    Minutes per session: Not on file  . Stress: Not on file  Relationships  . Social connections:    Talks on phone: Not on file    Gets together: Not on file    Attends religious service: Not on file    Active member of club or organization: Not on file    Attends meetings of clubs or organizations: Not on file    Relationship status: Not on file  . Intimate partner violence:    Fear of current or ex partner: Not on file    Emotionally abused: Not on file    Physically abused: Not on file    Forced sexual activity: Not on file  Other Topics Concern  . Not on file  Social History Narrative   Widowed   Lives by herself in a town house, drives    Son Anberlin Diez     Family History  Problem Relation Age of Onset  . Coronary artery disease Mother   . Heart failure Mother   . Renal cancer Mother   . Kidney disease Mother   . Stroke Father        in his 14s  . Colon cancer Father   . Breast cancer Sister         X 2  . Rectal cancer Sister   . Breast cancer Sister   . Diabetes Neg Hx     ROS- All systems are reviewed and negative except as per the HPI  above  Physical Exam: Vitals:   11/17/17 1405  BP: 132/84  Pulse: 95  SpO2: 98%  Weight: 170 lb (77.1 kg)  Height: 5\' 4"  (1.626 m)   Wt Readings from Last 3 Encounters:  11/17/17 170 lb (77.1 kg)  11/11/17 178 lb (80.7 kg)  11/08/17 176 lb 9.6 oz (80.1 kg)    Labs: Lab Results  Component Value Date   NA 142 06/14/2017   K 4.2 06/14/2017   CL 107 06/14/2017   CO2 25 06/14/2017   GLUCOSE 106 (H) 06/14/2017   BUN 25 (H) 06/14/2017   CREATININE 1.47 (H) 06/14/2017   CALCIUM 9.4 06/14/2017   PHOS 2.8 08/12/2015   MG 1.7 11/06/2016   Lab Results  Component Value Date   INR 1.07 11/06/2016   Lab Results  Component Value Date   CHOL  12/14/2007    112        ATP III CLASSIFICATION:  <200     mg/dL   Desirable  200-239  mg/dL   Borderline High  >=240    mg/dL   High   HDL 24 (L) 12/14/2007   LDLCALC  12/14/2007    69        Total Cholesterol/HDL:CHD Risk Coronary Heart Disease Risk Table                     Men  Women  1/2 Average Risk   3.4   3.3   TRIG 97 12/14/2007     GEN- The patient is well appearing, alert and oriented x 3 today.   Head- normocephalic, atraumatic Eyes-  Sclera clear, conjunctiva pink Ears- hearing intact Oropharynx- clear Neck- supple, no JVP Lymph- no cervical lymphadenopathy Lungs- Clear to ausculation bilaterally, normal work of breathing Heart- irregular rate and rhythm, no murmurs, rubs or gallops, PMI not laterally displaced GI- soft, NT, ND, + BS Extremities- no clubbing, cyanosis, or edema MS- no significant deformity or atrophy Skin- no rash or lesion Psych- euthymic mood, full affect Neuro- strength and sensation are intact  EKG- afib at 95 bpm, qrs int 86 ms, qtc 452 ms Epic records reviewed  Assessment and Plan: 1. Persistent  afib She did not tolerate amiodarone and  has stopped drug She missed 2 days of lasix last week and is now taking daily with 8 lb weight lose and decreased LLE She misunderstood  instructions and did not increase coreg to 12.5 mg bid  she has taken this dose in the past, was  reduced to 6.25 mg bid last fall when she c/o of fatigue, but she is fatigued now in afib I feel if she is better rate controlled, she will feel better, and may help BP control as well  She will let me know if she does not tolerate this dose  Otherwise, f/u with Dr. Lovena Le 6/11  [name], [facility] [address] [place], [place] [address] [phone]

## 2017-11-26 DIAGNOSIS — K219 Gastro-esophageal reflux disease without esophagitis: Secondary | ICD-10-CM | POA: Diagnosis not present

## 2017-11-26 DIAGNOSIS — M546 Pain in thoracic spine: Secondary | ICD-10-CM | POA: Diagnosis not present

## 2017-11-26 DIAGNOSIS — S299XXA Unspecified injury of thorax, initial encounter: Secondary | ICD-10-CM | POA: Diagnosis not present

## 2017-11-26 DIAGNOSIS — Z79899 Other long term (current) drug therapy: Secondary | ICD-10-CM | POA: Diagnosis not present

## 2017-11-26 DIAGNOSIS — S0083XA Contusion of other part of head, initial encounter: Secondary | ICD-10-CM | POA: Diagnosis not present

## 2017-11-26 DIAGNOSIS — S20211A Contusion of right front wall of thorax, initial encounter: Secondary | ICD-10-CM | POA: Diagnosis not present

## 2017-11-26 DIAGNOSIS — R51 Headache: Secondary | ICD-10-CM | POA: Diagnosis not present

## 2017-11-26 DIAGNOSIS — I509 Heart failure, unspecified: Secondary | ICD-10-CM | POA: Diagnosis not present

## 2017-11-26 DIAGNOSIS — I4891 Unspecified atrial fibrillation: Secondary | ICD-10-CM | POA: Diagnosis not present

## 2017-11-26 DIAGNOSIS — Z880 Allergy status to penicillin: Secondary | ICD-10-CM | POA: Diagnosis not present

## 2017-11-26 DIAGNOSIS — I10 Essential (primary) hypertension: Secondary | ICD-10-CM | POA: Diagnosis not present

## 2017-11-26 DIAGNOSIS — R220 Localized swelling, mass and lump, head: Secondary | ICD-10-CM | POA: Diagnosis not present

## 2017-11-26 DIAGNOSIS — Z7901 Long term (current) use of anticoagulants: Secondary | ICD-10-CM | POA: Diagnosis not present

## 2017-11-26 DIAGNOSIS — Z885 Allergy status to narcotic agent status: Secondary | ICD-10-CM | POA: Diagnosis not present

## 2017-11-26 DIAGNOSIS — Z888 Allergy status to other drugs, medicaments and biological substances status: Secondary | ICD-10-CM | POA: Diagnosis not present

## 2017-11-30 LAB — CUP PACEART REMOTE DEVICE CHECK
Battery Remaining Longevity: 110 mo
Battery Remaining Percentage: 95.5 %
Battery Voltage: 2.99 V
Brady Statistic AS VP Percent: 1 %
Brady Statistic RA Percent Paced: 2 %
Date Time Interrogation Session: 20190428212422
Implantable Lead Implant Date: 20180427
Implantable Lead Location: 753859
Implantable Pulse Generator Implant Date: 20180427
Lead Channel Impedance Value: 430 Ohm
Lead Channel Pacing Threshold Amplitude: 1 V
Lead Channel Pacing Threshold Pulse Width: 0.5 ms
Lead Channel Sensing Intrinsic Amplitude: 2.1 mV
MDC IDC LEAD IMPLANT DT: 20180427
MDC IDC LEAD LOCATION: 753860
MDC IDC MSMT LEADCHNL RV IMPEDANCE VALUE: 530 Ohm
MDC IDC MSMT LEADCHNL RV PACING THRESHOLD AMPLITUDE: 0.75 V
MDC IDC MSMT LEADCHNL RV PACING THRESHOLD PULSEWIDTH: 0.5 ms
MDC IDC MSMT LEADCHNL RV SENSING INTR AMPL: 11.2 mV
MDC IDC SET LEADCHNL RA PACING AMPLITUDE: 2.5 V
MDC IDC SET LEADCHNL RV PACING AMPLITUDE: 2.5 V
MDC IDC SET LEADCHNL RV PACING PULSEWIDTH: 0.5 ms
MDC IDC SET LEADCHNL RV SENSING SENSITIVITY: 2 mV
MDC IDC STAT BRADY AP VP PERCENT: 1 %
MDC IDC STAT BRADY AP VS PERCENT: 3 %
MDC IDC STAT BRADY AS VS PERCENT: 95 %
MDC IDC STAT BRADY RV PERCENT PACED: 7.8 %
Pulse Gen Model: 2272
Pulse Gen Serial Number: 8900487

## 2017-12-10 DIAGNOSIS — Z947 Corneal transplant status: Secondary | ICD-10-CM | POA: Diagnosis not present

## 2017-12-10 DIAGNOSIS — H1851 Endothelial corneal dystrophy: Secondary | ICD-10-CM | POA: Diagnosis not present

## 2017-12-10 DIAGNOSIS — H353132 Nonexudative age-related macular degeneration, bilateral, intermediate dry stage: Secondary | ICD-10-CM | POA: Diagnosis not present

## 2017-12-10 DIAGNOSIS — H43813 Vitreous degeneration, bilateral: Secondary | ICD-10-CM | POA: Diagnosis not present

## 2017-12-10 DIAGNOSIS — Z961 Presence of intraocular lens: Secondary | ICD-10-CM | POA: Diagnosis not present

## 2017-12-21 ENCOUNTER — Encounter: Payer: Self-pay | Admitting: Internal Medicine

## 2017-12-21 ENCOUNTER — Encounter (INDEPENDENT_AMBULATORY_CARE_PROVIDER_SITE_OTHER): Payer: Self-pay

## 2017-12-21 ENCOUNTER — Ambulatory Visit (INDEPENDENT_AMBULATORY_CARE_PROVIDER_SITE_OTHER): Payer: Medicare Other | Admitting: Internal Medicine

## 2017-12-21 VITALS — BP 112/64 | HR 91 | Ht 64.0 in | Wt 171.0 lb

## 2017-12-21 DIAGNOSIS — I10 Essential (primary) hypertension: Secondary | ICD-10-CM | POA: Diagnosis not present

## 2017-12-21 DIAGNOSIS — I471 Supraventricular tachycardia: Secondary | ICD-10-CM | POA: Diagnosis not present

## 2017-12-21 DIAGNOSIS — Z950 Presence of cardiac pacemaker: Secondary | ICD-10-CM | POA: Diagnosis not present

## 2017-12-21 DIAGNOSIS — I442 Atrioventricular block, complete: Secondary | ICD-10-CM

## 2017-12-21 NOTE — Progress Notes (Addendum)
HPI [name] returns today for ongoing evaluation and management of her atrial fib. When I saw her a couple of months ago we had her start amiodarone but she was intolerant and stopped her meds. She has been stable with a strategy of rate control. She notes that her left leg is swollen at times. No trauma. She has dyspnea with exertion and she notes some insomnea.  Allergies  Allergen Reactions  . Morphine And Related Anaphylaxis and Nausea Only    Per family violently ill  . Oxycodone Other (See Comments)    Dizziness   . Penicillins     Joint edema Has patient had a PCN reaction causing immediate rash, facial/tongue/throat swelling, SOB or lightheadedness with hypotension: Yes Has patient had a PCN reaction causing severe rash involving mucus membranes or skin necrosis: No Has patient had a PCN reaction that required hospitalization: No Has patient had a PCN reaction occurring within the last 10 years: No If all of the above answers are "NO", then may proceed with Cephalosporin use.   . Amiodarone     Possible rash and nervousness per patient  . Colchicine Other (See Comments)    Hair loss  . Hydroxychloroquine Other (See Comments)    Unknown reaction  . Norvasc [Amlodipine Besylate]     Reaction not recalled by the patient  . Ofloxacin Other (See Comments)    Unknown   . Streptomycin Other (See Comments)    Unknown  . Tramadol Itching  . Alendronate Sodium Palpitations     Current Outpatient Medications  Medication Sig Dispense Refill  . benazepril (LOTENSIN) 20 MG tablet Take 1 tablet (20 mg total) by mouth daily. Take an extra 20 mg (1 tab) if your systolic (top number) of Blood Pressure is 180 or higher. 180 tablet 3  . beta carotene w/minerals (OCUVITE) tablet Take 1 tablet by mouth daily.     . brimonidine (ALPHAGAN) 0.2 % ophthalmic solution Place 1 drop into the right eye 2 (two) times daily.      . carboxymethylcellulose (REFRESH TEARS) 0.5 % SOLN Place 1  drop into both eyes 2 (two) times daily.     . carvedilol (COREG) 12.5 MG tablet Take 1 tablet (12.5 mg total) by mouth 2 (two) times daily. (Patient taking differently: Take 6.25 mg by mouth 2 (two) times daily. ) 180 tablet 2  . Cholecalciferol (VITAMIN D3) 3000 UNITS TABS Take 1 tablet by mouth daily.    . diphenhydrAMINE (BENADRYL) 25 MG tablet Take 25 mg by mouth at bedtime as needed for sleep (and sinus issues).     . docusate sodium (COLACE) 100 MG capsule Take 100 mg by mouth daily as needed for mild constipation. Stool softener     . furosemide (LASIX) 40 MG tablet Take 1 tablet (40 mg total) by mouth daily. Take an additional 20 mg (1/2 tab) if weight is 174 or more 215 tablet 3  . Misc Natural Products (OSTEO BI-FLEX ADV JOINT SHIELD) TABS Take 1 tablet by mouth daily.     Marland Kitchen omeprazole (PRILOSEC) 20 MG capsule Take 20 mg by mouth daily.      . Rivaroxaban (XARELTO) 15 MG TABS tablet Take 1 tablet (15 mg total) by mouth daily with supper. 30 tablet 11  . timolol (TIMOPTIC) 0.25 % ophthalmic solution Place 1 drop into the right eye 2 (two) times daily.    . vitamin B-12 (CYANOCOBALAMIN) 1000 MCG tablet Take 2,000 mcg by mouth daily.  No current facility-administered medications for this visit.      Past Medical History:  Diagnosis Date  . AV block, 1st degree   . Bradycardia   . CAD (coronary artery disease)    Non ST elevation 2005 ; LHC in 6/05 in setting of NSTEMI: ant apical AK and inf-apical AK, EF 29%, dOM2 70-80% (small); findings c/w Tako-Tsubo CM  . Cardiomyopathy, nonischemic ([place]) 04/2008   Likely Tako-Tsubo. Resolved. --dx'd on cath 2005. EF 29% with minimal distal CAD (small OM1 70-80).  Echo 10/09 EF 55%;  echo 01/26/12: EF 55%, mild LAE, PASP 34.  . Colitis, ischemic ([place])   . Complication of anesthesia    post anesthesia excessive somnulence  . Compression fracture of C-spine ([place]) 10/10/2011   Fell at home, tx at [facility]  . Diarrhea associated with  pseudomembranous colitis 6/11-17/2013   [facility]  . Dyslipidemia   . Fall    with non-healing rib fractures  . Glaucoma   . Granuloma annulare   . History of phlebitis   . Hypertension    SEVERE  . Idiopathic thrombocytopenic purpura (ITP) (HCC)   . Lower extremity edema   . Lumbar stenosis 2004   DR. MARK ROY  . Multiple pelvic fractures 05/2012   Dr Adaline Sill , [facility]  . Osteoporosis     ROS:   All systems reviewed and negative except as noted in the HPI.   Past Surgical History:  Procedure Laterality Date  . ABDOMINAL HYSTERECTOMY    . APPENDECTOMY    . BACK SURGERY  10/2004   LS Disc 4-5 no sx.  Marland Kitchen CARDIAC CATHETERIZATION     12/2003  . CATARACT EXTRACTION, BILATERAL    . COLONOSCOPY  09/2005   adenoma  . COLONOSCOPY W/ POLYPECTOMY    . 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 PACEMAKER IMPLANT N/A 11/06/2016   Procedure: Pacemaker Implant;  Surgeon: Evans Lance, MD;  Location: [place] [facility];  Service: Cardiovascular;  Laterality: N/A;  . rectal bleed  05-01-06   colonoscopy-ischemic colitis  . ROTATOR CUFF REPAIR    . VERTEBROPLASTY     Dr Gladstone Lighter     Family History  Problem Relation Age of Onset  . Coronary artery disease Mother   . Heart failure Mother   . Renal cancer Mother   . Kidney disease Mother   . Stroke Father        in his 48s  . Colon cancer Father   . Breast cancer Sister         X 2  . Rectal cancer Sister   . Breast cancer Sister   . Diabetes Neg Hx      Social History   Socioeconomic History  . Marital status: Widowed    Spouse name: Not on file  . Number of children: 1  . Years of education: Not on file  . Highest education level: Not on file  Occupational History  . Occupation: retired    Fish farm manager: RETIRED  Social Needs  . Financial resource strain: Not on file  . Food insecurity:    Worry: Not on file    Inability: Not on file  . Transportation needs:    Medical: Not on file    Non-medical: Not on file   Tobacco Use  . Smoking status: Never Smoker  . Smokeless tobacco: Never Used  Substance and Sexual Activity  . Alcohol use: No  . Drug use: No  . Sexual  activity: Not on file  Lifestyle  . Physical activity:    Days per week: Not on file    Minutes per session: Not on file  . Stress: Not on file  Relationships  . Social connections:    Talks on phone: Not on file    Gets together: Not on file    Attends religious service: Not on file    Active member of club or organization: Not on file    Attends meetings of clubs or organizations: Not on file    Relationship status: Not on file  . Intimate partner violence:    Fear of current or ex partner: Not on file    Emotionally abused: Not on file    Physically abused: Not on file    Forced sexual activity: Not on file  Other Topics Concern  . Not on file  Social History Narrative   Widowed   Lives by herself in a town house, drives    Son Dennisse Swader      BP 112/64   Pulse 91   Ht 5\' 4"  (1.626 m)   Wt 171 lb (77.6 kg)   SpO2 99%   BMI 29.35 kg/m   Physical Exam:  Well appearing elderly woman, NAD HEENT: Unremarkable Neck:  6 cm JVD, no thyromegally Lymphatics:  No adenopathy Back:  No CVA tenderness Lungs:  Clear with no wheezes HEART:  Regular rate rhythm, no murmurs, no rubs, no clicks Abd:  soft, positive bowel sounds, no organomegally, no rebound, no guarding Ext:  2 plus pulses, no edema, no cyanosis, no clubbing Skin:  No rashes no nodules Neuro:  CN II through XII intact, motor grossly intact  EKG - atrial fib with a controlled VR  DEVICE  Normal device function.  See PaceArt for details.   Assess/Plan: 1. Persistent atrial fib - her rates are reasonably well controlled. We discussed rate control. As she cannot tolerate amio, I think that she will continue rate control. 2. Coags - she has tolerated her Xarelto with no difficulty. 3. Chronic diastolic heart failure - her symptoms remain class 2. She  will continue her current meds. 4. Obesity - her weight appears to be stable. She is encouraged to eat less and increase her level of activity.  Mikle Bosworth.D.

## 2017-12-21 NOTE — Patient Instructions (Signed)
Medication Instructions:  Your physician recommends that you continue on your current medications as directed. Please refer to the Current Medication list given to you today.  Labwork: None ordered.  Testing/Procedures: None ordered.  Follow-Up: Your physician wants you to follow-up in: one year with Dr. Lovena Le.   You will receive a reminder letter in the mail two months in advance. If you don't receive a letter, please call our office to schedule the follow-up appointment.  Remote monitoring is used to monitor your Pacemaker from home. This monitoring reduces the number of office visits required to check your device to one time per year. It allows [place] to keep an eye on the functioning of your device to ensure it is working properly. You are scheduled for a device check from home on 02/08/2018. You may send your transmission at any time that day. If you have a wireless device, the transmission will be sent automatically. After your physician reviews your transmission, you will receive a postcard with your next transmission date.  Any Other Special Instructions Will Be Listed Below (If Applicable).  If you need a refill on your cardiac medications before your next appointment, please call your pharmacy.

## 2018-01-03 DIAGNOSIS — Z947 Corneal transplant status: Secondary | ICD-10-CM | POA: Diagnosis not present

## 2018-01-03 DIAGNOSIS — H353130 Nonexudative age-related macular degeneration, bilateral, stage unspecified: Secondary | ICD-10-CM | POA: Diagnosis not present

## 2018-01-03 DIAGNOSIS — Z9889 Other specified postprocedural states: Secondary | ICD-10-CM | POA: Diagnosis not present

## 2018-01-03 DIAGNOSIS — H0100A Unspecified blepharitis right eye, upper and lower eyelids: Secondary | ICD-10-CM | POA: Diagnosis not present

## 2018-01-03 DIAGNOSIS — H43813 Vitreous degeneration, bilateral: Secondary | ICD-10-CM | POA: Diagnosis not present

## 2018-01-03 DIAGNOSIS — H0100B Unspecified blepharitis left eye, upper and lower eyelids: Secondary | ICD-10-CM | POA: Diagnosis not present

## 2018-01-03 DIAGNOSIS — H26492 Other secondary cataract, left eye: Secondary | ICD-10-CM | POA: Diagnosis not present

## 2018-01-03 DIAGNOSIS — H17813 Minor opacity of cornea, bilateral: Secondary | ICD-10-CM | POA: Diagnosis not present

## 2018-01-03 DIAGNOSIS — H401130 Primary open-angle glaucoma, bilateral, stage unspecified: Secondary | ICD-10-CM | POA: Diagnosis not present

## 2018-01-03 LAB — CUP PACEART INCLINIC DEVICE CHECK
Brady Statistic RA Percent Paced: 0.17 %
Brady Statistic RV Percent Paced: 26 %
Date Time Interrogation Session: 20190611184141
Implantable Lead Implant Date: 20180427
Implantable Lead Implant Date: 20180427
Implantable Pulse Generator Implant Date: 20180427
Lead Channel Pacing Threshold Amplitude: 0.5 V
Lead Channel Pacing Threshold Amplitude: 0.5 V
Lead Channel Sensing Intrinsic Amplitude: 12 mV
Lead Channel Setting Sensing Sensitivity: 2 mV
MDC IDC LEAD LOCATION: 753859
MDC IDC LEAD LOCATION: 753860
MDC IDC MSMT BATTERY REMAINING LONGEVITY: 121 mo
MDC IDC MSMT BATTERY VOLTAGE: 2.99 V
MDC IDC MSMT LEADCHNL RA IMPEDANCE VALUE: 437.5 Ohm
MDC IDC MSMT LEADCHNL RA SENSING INTR AMPL: 0.8 mV
MDC IDC MSMT LEADCHNL RV IMPEDANCE VALUE: 525 Ohm
MDC IDC MSMT LEADCHNL RV PACING THRESHOLD PULSEWIDTH: 0.5 ms
MDC IDC MSMT LEADCHNL RV PACING THRESHOLD PULSEWIDTH: 0.5 ms
MDC IDC PG SERIAL: 8900487
MDC IDC SET LEADCHNL RA PACING AMPLITUDE: 2.5 V
MDC IDC SET LEADCHNL RV PACING AMPLITUDE: 2.5 V
MDC IDC SET LEADCHNL RV PACING PULSEWIDTH: 0.5 ms
Pulse Gen Model: 2272

## 2018-02-08 ENCOUNTER — Ambulatory Visit (INDEPENDENT_AMBULATORY_CARE_PROVIDER_SITE_OTHER): Payer: Medicare Other | Admitting: *Deleted

## 2018-02-08 DIAGNOSIS — I442 Atrioventricular block, complete: Secondary | ICD-10-CM | POA: Diagnosis not present

## 2018-02-08 NOTE — Progress Notes (Signed)
Remote pacemaker transmission.   

## 2018-02-09 ENCOUNTER — Encounter: Payer: Self-pay | Admitting: Cardiology

## 2018-02-15 DIAGNOSIS — H40003 Preglaucoma, unspecified, bilateral: Secondary | ICD-10-CM | POA: Diagnosis not present

## 2018-03-12 LAB — CUP PACEART REMOTE DEVICE CHECK
Battery Voltage: 3.01 V
Brady Statistic AP VP Percent: 1 %
Brady Statistic AS VP Percent: 12 %
Brady Statistic AS VS Percent: 87 %
Brady Statistic RA Percent Paced: 1 %
Brady Statistic RV Percent Paced: 12 %
Implantable Lead Implant Date: 20180427
Implantable Lead Location: 753859
Implantable Pulse Generator Implant Date: 20180427
Lead Channel Impedance Value: 530 Ohm
Lead Channel Pacing Threshold Amplitude: 1 V
Lead Channel Pacing Threshold Pulse Width: 0.5 ms
Lead Channel Pacing Threshold Pulse Width: 0.5 ms
Lead Channel Setting Pacing Amplitude: 2.5 V
Lead Channel Setting Sensing Sensitivity: 2 mV
MDC IDC LEAD IMPLANT DT: 20180427
MDC IDC LEAD LOCATION: 753860
MDC IDC MSMT BATTERY REMAINING LONGEVITY: 119 mo
MDC IDC MSMT BATTERY REMAINING PERCENTAGE: 95.5 %
MDC IDC MSMT LEADCHNL RA IMPEDANCE VALUE: 440 Ohm
MDC IDC MSMT LEADCHNL RA SENSING INTR AMPL: 0.8 mV
MDC IDC MSMT LEADCHNL RV PACING THRESHOLD AMPLITUDE: 0.5 V
MDC IDC MSMT LEADCHNL RV SENSING INTR AMPL: 12 mV
MDC IDC SESS DTM: 20190730060015
MDC IDC SET LEADCHNL RA PACING AMPLITUDE: 2.5 V
MDC IDC SET LEADCHNL RV PACING PULSEWIDTH: 0.5 ms
MDC IDC STAT BRADY AP VS PERCENT: 1 %
Pulse Gen Serial Number: 8900487

## 2018-03-23 ENCOUNTER — Telehealth: Payer: Self-pay

## 2018-03-23 NOTE — Telephone Encounter (Signed)
**Note De-Identified [name] Obfuscation** The pt called the office concerned about the cost of her Xarelto. She states that she has not met her deductible this year and per the pt her insurance told her that is why her Xarelto is so expensive. She states that she is almost out of Xarelto at this time.  I advised the pt that I have done an appeal on her Xarelto tier exception and that I will leave her 2 bottles of Xarelto samples in the front office for her to pick up and take until we get the determination.  She verbalized understanding and thanked me for helping her.

## 2018-03-23 NOTE — Telephone Encounter (Signed)
We received a denial on the pts Xarelto tier exception. Reason:  You asked for a brand name drug at a lower cost level. Xarelto is already at a tier 3 and under Medicare rules it cannot be lowered any further.  A Request for Redetermination of Medicare Prescription Drug Denial form was included with the denial letter. I have completed this appeal form and faxed it back to [facility] in hopes of getting an approval.

## 2018-03-25 ENCOUNTER — Ambulatory Visit: Payer: Self-pay

## 2018-03-25 DIAGNOSIS — L500 Allergic urticaria: Secondary | ICD-10-CM | POA: Diagnosis not present

## 2018-03-25 NOTE — Telephone Encounter (Signed)
Pt.reports she was seen by her dentist this week her put her on Clindamycin. Broke out in a rash shortly after starting the medication. Reports it is on her neck, trunk,arms and legs. Taking Benadryl, helping "a little" but the rash has spread. No availability in the office. Pt. Will go to Urgent Care in [place].  Reason for Disposition . Hives or itching  Answer Assessment - Initial Assessment Questions 1. APPEARANCE of RASH: "Describe the rash." (e.g., spots, blisters, raised areas, skin peeling, scaly)     Looks like hives 2. SIZE: "How big are the spots?" (e.g., tip of pen, eraser, coin; inches, centimeters)     Looks like big spots 3. LOCATION: "Where is the rash located?"     Trunk , arms and legs and neck 4. COLOR: "What color is the rash?" (Note: It is difficult to assess rash color in people with darker-colored skin. When this situation occurs, simply ask the caller to describe what they see.)     Red 5. ONSET: "When did the rash begin?"     Started after she started an antibiotic the dentist gave her 6. FEVER: "Do you have a fever?" If so, ask: "What is your temperature, how was it measured, and when did it start?"     No 7. ITCHING: "Does the rash itch?" If so, ask: "How bad is the itch?" (Scale 1-10; or mild, moderate, severe)     Severe 8. CAUSE: "What do you think is causing the rash?"     Clindymycin 9. NEW MEDICATION: "What new medication are you taking?" (e.g., name of antibiotic) "When did you start taking this medication?".     Antibiotic 10. OTHER SYMPTOMS: "Do you have any other symptoms?" (e.g., sore throat, fever, joint pain)       No 11. PREGNANCY: "Is there any chance you are pregnant?" "When was your last menstrual period?"       n/a  Protocols used: RASH - WIDESPREAD ON DRUGS-A-AH

## 2018-03-25 NOTE — Telephone Encounter (Signed)
thx

## 2018-03-28 ENCOUNTER — Telehealth: Payer: Self-pay | Admitting: Internal Medicine

## 2018-03-28 ENCOUNTER — Other Ambulatory Visit: Payer: Self-pay

## 2018-03-28 DIAGNOSIS — I480 Paroxysmal atrial fibrillation: Secondary | ICD-10-CM

## 2018-03-28 MED ORDER — RIVAROXABAN 15 MG PO TABS: 15.0000 mg | ORAL_TABLET | Freq: Every day | ORAL | 2 refills | Status: DC

## 2018-03-28 NOTE — Telephone Encounter (Signed)
**Note De-Identified Macdonald Rigor Obfuscation** The pt states that [facility] will charge her $428 for a 30 day supply of Xarelto and she cannot afford that. She states that she has enough Xarelto to last a couple weeks so she is requesting that I send her Xarelto RX to [facility] in pharmacy on Oct. 1 2019 as they have advised her that Xarelto will cost her $245 for a 90 day supply with them. I have asked her to call and remind me to send her RX in on 04/12/18. She states that she will call to remind me.

## 2018-03-28 NOTE — Telephone Encounter (Signed)
We received a denial on the pts Xarelto tier exception. Reason: Since there is no alternative brand name drug for the pts condition in a lower cost-sharing tier, a tier exception is not permitted under medicare rules.  The pt is aware and she states that she is going to try to pay for Xarelto for as long as she can afford it. She is advised that if she gets to a point that she cannot afford her Xarelto any longer to contact us.  She verbalized understanding and thanked me for helping her.

## 2018-03-28 NOTE — Telephone Encounter (Signed)
New Message:    Patient have questions about the Xarelto

## 2018-03-30 ENCOUNTER — Encounter: Payer: Self-pay | Admitting: Internal Medicine

## 2018-03-30 ENCOUNTER — Ambulatory Visit (INDEPENDENT_AMBULATORY_CARE_PROVIDER_SITE_OTHER): Payer: Medicare Other | Admitting: Internal Medicine

## 2018-03-30 VITALS — BP 128/84 | HR 93 | Temp 98.4°F | Resp 16 | Ht 64.0 in | Wt 168.2 lb

## 2018-03-30 DIAGNOSIS — T7840XD Allergy, unspecified, subsequent encounter: Secondary | ICD-10-CM | POA: Diagnosis not present

## 2018-03-30 NOTE — Patient Instructions (Addendum)
  GO TO THE FRONT DESK Schedule your next appointment for a checkup next month   Continue taking Benadryl OTC as needed for itching  If the rash come back or your itching is not improving in the next several days please call the office

## 2018-03-30 NOTE — Progress Notes (Signed)
Subjective:    Patient ID: [name], female    DOB: 1923-04-04, 84 y.o.   MRN: [id number]  DOS:  03/30/2018 Type of visit - description : acute Interval history: Was prescribed clindamycin for a dental infection by her dentist on 03/15/2018, she took it for 1 week. She developed generalized rash on 03/16/2018 as well as itching, nevertheless she continue taking the antibiotic. Eventually went to urgent care on 03/25/18   , was diagnosed with an allergic reaction, prescribed prednisone. Overall she is better.  Itching has decreased, redness has definitely decreased but is not completely gone.  Review of Systems  Denies any tongue swelling but her lips were slightly swollen during the acute reaction. Denies difficulty breathing.  Past Medical History:  Diagnosis Date  . AV block, 1st degree   . Bradycardia   . CAD (coronary artery disease)    Non ST elevation 2005 ; LHC in 6/05 in setting of NSTEMI: ant apical AK and inf-apical AK, EF 29%, dOM2 70-80% (small); findings c/w Tako-Tsubo CM  . Cardiomyopathy, nonischemic ([place]) 04/2008   Likely Tako-Tsubo. Resolved. --dx'd on cath 2005. EF 29% with minimal distal CAD (small OM1 70-80).  Echo 10/09 EF 55%;  echo 01/26/12: EF 55%, mild LAE, PASP 34.  . Colitis, ischemic ([place])   . Complication of anesthesia    post anesthesia excessive somnulence  . Compression fracture of C-spine ([place]) 10/10/2011   Fell at home, tx at [facility]  . Diarrhea associated with pseudomembranous colitis 6/11-17/2013   [facility]  . Dyslipidemia   . Fall    with non-healing rib fractures  . Glaucoma   . Granuloma annulare   . History of phlebitis   . Hypertension    SEVERE  . Idiopathic thrombocytopenic purpura (ITP) (HCC)   . Lower extremity edema   . Lumbar stenosis 2004   DR. MARK ROY  . Multiple pelvic fractures 05/2012   Dr Adaline Sill , [facility]  . Osteoporosis     Past Surgical History:  Procedure Laterality Date  . ABDOMINAL HYSTERECTOMY      . APPENDECTOMY    . BACK SURGERY  10/2004   LS Disc 4-5 no sx.  Marland Kitchen CARDIAC CATHETERIZATION     12/2003  . CATARACT EXTRACTION, BILATERAL    . COLONOSCOPY  09/2005   adenoma  . COLONOSCOPY W/ POLYPECTOMY    . 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 PACEMAKER IMPLANT N/A 11/06/2016   Procedure: Pacemaker Implant;  Surgeon: Evans Lance, MD;  Location: [facility];  Service: Cardiovascular;  Laterality: N/A;  . rectal bleed  05-01-06   colonoscopy-ischemic colitis  . ROTATOR CUFF REPAIR    . VERTEBROPLASTY     Dr Gladstone Lighter    Social History   Socioeconomic History  . Marital status: Widowed    Spouse name: Not on file  . Number of children: 1  . Years of education: Not on file  . Highest education level: Not on file  Occupational History  . Occupation: retired    Fish farm manager: RETIRED  Social Needs  . Financial resource strain: Not on file  . Food insecurity:    Worry: Not on file    Inability: Not on file  . Transportation needs:    Medical: Not on file    Non-medical: Not on file  Tobacco Use  . Smoking status: Never Smoker  . Smokeless tobacco: Never Used  Substance and Sexual Activity  . Alcohol use: No  .  Drug use: No  . Sexual activity: Not Currently  Lifestyle  . Physical activity:    Days per week: Not on file    Minutes per session: Not on file  . Stress: Not on file  Relationships  . Social connections:    Talks on phone: Not on file    Gets together: Not on file    Attends religious service: Not on file    Active member of club or organization: Not on file    Attends meetings of clubs or organizations: Not on file    Relationship status: Not on file  . Intimate partner violence:    Fear of current or ex partner: Not on file    Emotionally abused: Not on file    Physically abused: Not on file    Forced sexual activity: Not on file  Other Topics Concern  . Not on file  Social History Narrative   Widowed   Lives by herself in a town house,  drives    Son [name]       Allergies as of 03/30/2018      Reactions   Morphine And Related Anaphylaxis, Nausea Only   Per family violently ill   Oxycodone Other (See Comments)   Dizziness    Penicillins    Joint edema Has patient had a PCN reaction causing immediate rash, facial/tongue/throat swelling, SOB or lightheadedness with hypotension: Yes Has patient had a PCN reaction causing severe rash involving mucus membranes or skin necrosis: No Has patient had a PCN reaction that required hospitalization: No Has patient had a PCN reaction occurring within the last 10 years: No If all of the above answers are "NO", then may proceed with Cephalosporin use.   Amiodarone    Possible rash and nervousness per patient   Clindamycin/lincomycin Hives, Itching   Colchicine Other (See Comments)   Hair loss   Hydroxychloroquine Other (See Comments)   Unknown reaction   Norvasc [amlodipine Besylate]    Reaction not recalled by the patient   Ofloxacin Other (See Comments)   Unknown   Streptomycin Other (See Comments)   Unknown   Tramadol Itching   Alendronate Sodium Palpitations      Medication List        Accurate as of 03/30/18 11:59 PM. Always use your most recent med list.          benazepril 20 MG tablet Commonly known as:  LOTENSIN Take 1 tablet (20 mg total) by mouth daily. Take an extra 20 mg (1 tab) if your systolic (top number) of Blood Pressure is 180 or higher.   beta carotene w/minerals tablet Take 1 tablet by mouth daily.   brimonidine 0.2 % ophthalmic solution Commonly known as:  ALPHAGAN Place 1 drop into the right eye 2 (two) times daily.   carvedilol 12.5 MG tablet Commonly known as:  COREG Take 1 tablet (12.5 mg total) by mouth 2 (two) times daily.   diphenhydrAMINE 25 MG tablet Commonly known as:  BENADRYL Take 25 mg by mouth at bedtime as needed for sleep (and sinus issues).   docusate sodium 100 MG capsule Commonly known as:  COLACE Take 100  mg by mouth daily as needed for mild constipation. Stool softener   furosemide 40 MG tablet Commonly known as:  LASIX Take 1 tablet (40 mg total) by mouth daily. Take an additional 20 mg (1/2 tab) if weight is 174 or more   omeprazole 20 MG capsule Commonly known as:  PRILOSEC  Take 20 mg by mouth daily.   OSTEO BI-FLEX ADV JOINT SHIELD Tabs Take 1 tablet by mouth daily.   REFRESH TEARS 0.5 % Soln Generic drug:  carboxymethylcellulose Place 1 drop into both eyes 2 (two) times daily.   Rivaroxaban 15 MG Tabs tablet Commonly known as:  XARELTO Take 1 tablet (15 mg total) by mouth daily with supper.   timolol 0.25 % ophthalmic solution Commonly known as:  TIMOPTIC Place 1 drop into the right eye 2 (two) times daily.   vitamin B-12 1000 MCG tablet Commonly known as:  CYANOCOBALAMIN Take 2,000 mcg by mouth daily.   Vitamin D3 3000 units Tabs Take 1 tablet by mouth daily.          Objective:   Physical Exam BP 128/84 (BP Location: Left Arm, Patient Position: Sitting, Cuff Size: Normal)   Pulse 93   Temp 98.4 F (36.9 C) (Oral)   Resp 16   Ht _0  (1.626 m)   Wt 168 lb 4 oz (76.3 kg)   SpO2 98%   BMI 28.88 kg/m  General:   Well developed, NAD, see BMI.  HEENT:  Normocephalic . Face symmetric, atraumatic Tongue and lips not swelling Lungs:  CTA B Normal respiratory effort, no intercostal retractions, no accessory muscle use. Heart: RRR,  no murmur.  No pretibial edema bilaterally  Skin: Very mild redness around her eyes and on the upper chest. Neurologic:  alert & oriented X3.  Speech normal, gait appropriate for age and unassisted Psych--  Cognition and judgment appear intact.  Cooperative with normal attention span and concentration.  Behavior appropriate. No anxious or depressed appearing.      Assessment & Plan:     Assessment   HTN severe CKD: creat ~1.4, 1.5 Dyslipidemia Hematology: --ITP-- hematology visit for 11-05-14, released to the care  of PCP , check CBCs every 4 months, consult hematology if platelets <50 K --Pernicious anemia --Anemia of chronic disease CV: --CAD --Tako Tsubo cardiomyopathy --Diastolic  CHF, resolved, Dr Missy Sabins --AV block first-degree ; Paroxysmal Afib with RVR/Tachybrady syndrome s/p admission: implanted aSt Jude PPM 11/06/16 DJD: --Compression fractures --Lumbar stenosis Dr. Carloyn Manner --Multiple pelvic fractures , cervical spine compression  fracture 2013 [facility] --H/o nonhealing rib  --Osteoporosis: Dr. Agapito Games; s/p forteo, s/p  Prolia 09-2015. Last DEXA 09-29-16 OPHT: --Glaucoma  --S/p cornea transplant -- mac degeneration h/o granuloma annulare   H/o pseudomembranous colitis 2013  PLAN: Allergic reaction: Developed generalized rash and itching while taking clindamycin, that is likely the culprit.  She was not taking any other new med. S/p prednisone, improving.   Recommend complete avoidance of clindamycin, continue Benadryl, states she is not getting excessively sleepy with it.   Call if her improvement does not continue gradually. RTC next month for a check up

## 2018-03-30 NOTE — Progress Notes (Signed)
Pre visit review using our clinic review tool, if applicable. No additional management support is needed unless otherwise documented below in the visit note. 

## 2018-03-31 NOTE — Assessment & Plan Note (Signed)
Allergic reaction: Developed generalized rash and itching while taking clindamycin, that is likely the culprit.  She was not taking any other new med. S/p prednisone, improving.   Recommend complete avoidance of clindamycin, continue Benadryl, states she is not getting excessively sleepy with it.   Call if her improvement does not continue gradually. RTC next month for a check up

## 2018-04-01 ENCOUNTER — Telehealth: Payer: Self-pay | Admitting: Internal Medicine

## 2018-04-01 DIAGNOSIS — I480 Paroxysmal atrial fibrillation: Secondary | ICD-10-CM

## 2018-04-01 MED ORDER — RIVAROXABAN 15 MG PO TABS: 15.0000 mg | ORAL_TABLET | Freq: Every day | ORAL | 1 refills | Status: DC

## 2018-04-01 NOTE — Telephone Encounter (Signed)
°*  STAT* If patient is at the pharmacy, call can be transferred to refill team.   1. Which medications need to be refilled? (please list name of each medication and dose if known) XARELTON 15mg   2. Which pharmacy/location (including street and city if local pharmacy) is medication to be sent to? [facility]  Id [id number]    Phone # [phone]  3. Do they need a 30 day or 90 day supply?  [place]

## 2018-04-01 NOTE — Telephone Encounter (Signed)
CrCl 29, pt on appropriate dose of Xarelto 15. Refill sent in.

## 2018-04-08 ENCOUNTER — Telehealth: Payer: Self-pay | Admitting: Internal Medicine

## 2018-04-08 NOTE — Telephone Encounter (Signed)
Please see phone note from 03/23/18.

## 2018-04-08 NOTE — Telephone Encounter (Signed)
New message   Patient would like a return call to discuss her medication xarelto and wants to see if she can get a recommendation for another medication.

## 2018-04-12 NOTE — Telephone Encounter (Signed)
Call placed to Pt.  Pt has decided to continue Xarelto through the end of the year.  She is considering changing insurers.  She would like this nurse to call her back after October 16 to discuss further.  Will follow up.

## 2018-05-02 ENCOUNTER — Encounter: Payer: Self-pay | Admitting: Internal Medicine

## 2018-05-02 ENCOUNTER — Ambulatory Visit (INDEPENDENT_AMBULATORY_CARE_PROVIDER_SITE_OTHER): Payer: Medicare Other | Admitting: Internal Medicine

## 2018-05-02 VITALS — BP 126/68 | HR 98 | Temp 98.3°F | Resp 16 | Ht 64.0 in | Wt 166.5 lb

## 2018-05-02 DIAGNOSIS — D693 Immune thrombocytopenic purpura: Secondary | ICD-10-CM

## 2018-05-02 DIAGNOSIS — R739 Hyperglycemia, unspecified: Secondary | ICD-10-CM | POA: Diagnosis not present

## 2018-05-02 DIAGNOSIS — I10 Essential (primary) hypertension: Secondary | ICD-10-CM | POA: Diagnosis not present

## 2018-05-02 DIAGNOSIS — Z23 Encounter for immunization: Secondary | ICD-10-CM

## 2018-05-02 MED ORDER — ZOSTER VAC RECOMB ADJUVANTED 50 MCG/0.5ML IM SUSR: 0.5000 mL | Freq: Once | INTRAMUSCULAR | 1 refills | Status: AC

## 2018-05-02 NOTE — Patient Instructions (Signed)
GO TO THE LAB : Get the blood work     GO TO THE FRONT DESK Schedule your next appointment for a checkup in 4 months  In 2 to 3 weeks, proceed with your shingles immunizations.  See prescription  Ok to decrease carvedilol to 1/2 tablet twice a day  Please  check the  blood pressure and PULSE daily  Be sure your blood pressure is between 110/65 and  135/85. Pulse should be below 95  CALL with readings in 2 weeks

## 2018-05-02 NOTE — Progress Notes (Signed)
Subjective:    Patient ID: [name], female    DOB: 20-Sep-1922, 84 y.o.   MRN: [id number]  DOS:  05/02/2018 Type of visit - description : rov Interval history: HTN: Well-controlled. Atrial fibrillation, heart rate today 98, usually better (lower).  Good compliance with Xarelto. Hyperglycemia: We have not check a A1c in a while, will do today.  Review of Systems Denies chest pain, lower extremity edema No nausea, vomiting, diarrhea or blood in the stools. No recent falls. + Easy bruising   Past Medical History:  Diagnosis Date  . AV block, 1st degree   . Bradycardia   . CAD (coronary artery disease)    Non ST elevation 2005 ; LHC in 6/05 in setting of NSTEMI: ant apical AK and inf-apical AK, EF 29%, dOM2 70-80% (small); findings c/w Tako-Tsubo CM  . Cardiomyopathy, nonischemic ([place]) 04/2008   Likely Tako-Tsubo. Resolved. --dx'd on cath 2005. EF 29% with minimal distal CAD (small OM1 70-80).  Echo 10/09 EF 55%;  echo 01/26/12: EF 55%, mild LAE, PASP 34.  . Colitis, ischemic ([place])   . Complication of anesthesia    post anesthesia excessive somnulence  . Compression fracture of C-spine ([place]) 10/10/2011   Fell at home, tx at [facility]  . Diarrhea associated with pseudomembranous colitis 6/11-17/2013   [facility]  . Dyslipidemia   . Fall    with non-healing rib fractures  . Glaucoma   . Granuloma annulare   . History of phlebitis   . Hypertension    SEVERE  . Idiopathic thrombocytopenic purpura (ITP) (HCC)   . Lower extremity edema   . Lumbar stenosis 2004   DR. MARK ROY  . Multiple pelvic fractures 05/2012   Dr Adaline Sill , [facility]  . Osteoporosis     Past Surgical History:  Procedure Laterality Date  . ABDOMINAL HYSTERECTOMY    . APPENDECTOMY    . BACK SURGERY  10/2004   LS Disc 4-5 no sx.  Marland Kitchen CARDIAC CATHETERIZATION     12/2003  . CATARACT EXTRACTION, BILATERAL    . COLONOSCOPY  09/2005   adenoma  . COLONOSCOPY W/ POLYPECTOMY    . CORNEAL TRANSPLANT   11-27-02   left eye  . PACEMAKER IMPLANT N/A 11/06/2016   Procedure: Pacemaker Implant;  Surgeon: Evans Lance, MD;  Location: [facility];  Service: Cardiovascular;  Laterality: N/A;  . rectal bleed  05-01-06   colonoscopy-ischemic colitis  . ROTATOR CUFF REPAIR    . VERTEBROPLASTY     Dr Gladstone Lighter    Social History   Socioeconomic History  . Marital status: Widowed    Spouse name: Not on file  . Number of children: 1  . Years of education: Not on file  . Highest education level: Not on file  Occupational History  . Occupation: retired    Fish farm manager: RETIRED  Social Needs  . Financial resource strain: Not on file  . Food insecurity:    Worry: Not on file    Inability: Not on file  . Transportation needs:    Medical: Not on file    Non-medical: Not on file  Tobacco Use  . Smoking status: Never Smoker  . Smokeless tobacco: Never Used  Substance and Sexual Activity  . Alcohol use: No  . Drug use: No  . Sexual activity: Not Currently  Lifestyle  . Physical activity:    Days per week: Not on file    Minutes per session: Not on file  .  Stress: Not on file  Relationships  . Social connections:    Talks on phone: Not on file    Gets together: Not on file    Attends religious service: Not on file    Active member of club or organization: Not on file    Attends meetings of clubs or organizations: Not on file    Relationship status: Not on file  . Intimate partner violence:    Fear of current or ex partner: Not on file    Emotionally abused: Not on file    Physically abused: Not on file    Forced sexual activity: Not on file  Other Topics Concern  . Not on file  Social History Narrative   Widowed   Lives by herself in a town house, drives    Son Smt. Loder       Allergies as of 05/02/2018      Reactions   Morphine And Related Anaphylaxis, Nausea Only   Per family violently ill   Oxycodone Other (See Comments)   Dizziness    Penicillins    Joint  edema Has patient had a PCN reaction causing immediate rash, facial/tongue/throat swelling, SOB or lightheadedness with hypotension: Yes Has patient had a PCN reaction causing severe rash involving mucus membranes or skin necrosis: No Has patient had a PCN reaction that required hospitalization: No Has patient had a PCN reaction occurring within the last 10 years: No If all of the above answers are "NO", then may proceed with Cephalosporin use.   Amiodarone    Possible rash and nervousness per patient   Clindamycin/lincomycin Hives, Itching   Colchicine Other (See Comments)   Hair loss   Hydroxychloroquine Other (See Comments)   Unknown reaction   Norvasc [amlodipine Besylate]    Reaction not recalled by the patient   Ofloxacin Other (See Comments)   Unknown   Streptomycin Other (See Comments)   Unknown   Tramadol Itching   Alendronate Sodium Palpitations      Medication List        Accurate as of 05/02/18 11:59 PM. Always use your most recent med list.          benazepril 20 MG tablet Commonly known as:  LOTENSIN Take 1 tablet (20 mg total) by mouth daily. Take an extra 20 mg (1 tab) if your systolic (top number) of Blood Pressure is 180 or higher.   beta carotene w/minerals tablet Take 1 tablet by mouth daily.   brimonidine 0.2 % ophthalmic solution Commonly known as:  ALPHAGAN Place 1 drop into the right eye 2 (two) times daily.   carvedilol 12.5 MG tablet Commonly known as:  COREG Take 6.25 mg by mouth 2 (two) times daily with a meal.   diphenhydrAMINE 25 MG tablet Commonly known as:  BENADRYL Take 25 mg by mouth at bedtime as needed for sleep (and sinus issues).   docusate sodium 100 MG capsule Commonly known as:  COLACE Take 100 mg by mouth daily as needed for mild constipation. Stool softener   furosemide 40 MG tablet Commonly known as:  LASIX Take 1 tablet (40 mg total) by mouth daily. Take an additional 20 mg (1/2 tab) if weight is 174 or more    omeprazole 20 MG capsule Commonly known as:  PRILOSEC Take 20 mg by mouth daily.   OSTEO BI-FLEX ADV JOINT SHIELD Tabs Take 1 tablet by mouth daily.   REFRESH TEARS 0.5 % Soln Generic drug:  carboxymethylcellulose Place 1 drop into  both eyes 2 (two) times daily.   Rivaroxaban 15 MG Tabs tablet Commonly known as:  XARELTO Take 1 tablet (15 mg total) by mouth daily with supper.   timolol 0.25 % ophthalmic solution Commonly known as:  TIMOPTIC Place 1 drop into the right eye 2 (two) times daily.   vitamin B-12 1000 MCG tablet Commonly known as:  CYANOCOBALAMIN Take 2,000 mcg by mouth daily.   Vitamin D3 3000 units Tabs Take 1 tablet by mouth daily.   Zoster Vaccine Adjuvanted injection Commonly known as:  SHINGRIX Inject 0.5 mLs into the muscle once for 1 dose.          Objective:   Physical Exam BP 126/68 (BP Location: Left Arm, Patient Position: Sitting, Cuff Size: Small)   Pulse 98   Temp 98.3 F (36.8 C) (Oral)   Resp 16   Ht _0  (1.626 m)   Wt 166 lb 8 oz (75.5 kg)   SpO2 97%   BMI 28.58 kg/m  General:   Well developed, NAD, elderly female, seems to be doing well.  HEENT:  Normocephalic . Face symmetric, atraumatic Lungs:  Decreased breath sounds bilaterally. Normal respiratory effort, no intercostal retractions, no accessory muscle use. Heart: RRR,  no murmur.  No pretibial edema bilaterally  Skin: Not pale. Not jaundice Neurologic:  alert & oriented X3.  Speech normal, gait back cane, appropriate for age Psych--  Cognition and judgment appear intact.  Cooperative with normal attention span and concentration.  Behavior appropriate. No anxious or depressed appearing.      Assessment & Plan:   Assessment   HTN severe CKD: creat ~1.4, 1.5 Dyslipidemia Hematology: --ITP-- hematology visit for 11-05-14, released to the care of PCP , check CBCs every 4 months, consult hematology if platelets <50 K --Pernicious anemia --Anemia of chronic  disease CV: --CAD --Tako Tsubo cardiomyopathy --Diastolic  CHF, resolved, Dr Missy Sabins --AV block first-degree ; Paroxysmal Afib with RVR/Tachybrady syndrome s/p admission: implanted aSt Jude PPM 11/06/16 DJD: --Compression fractures --Lumbar stenosis Dr. Carloyn Manner --Multiple pelvic fractures , cervical spine compression  fracture 2013 [facility] --H/o nonhealing rib  --Osteoporosis: Dr. Agapito Games; s/p forteo, s/p  Prolia 09-2015. Last DEXA 09-29-16 OPHT: --Glaucoma  --S/p cornea transplant -- mac degeneration h/o granuloma annulare   H/o pseudomembranous colitis 2013  PLAN: HTN: Currently on Lotensin, carvedilol 12.5 mg B.I.D., Lasix.  Reports that has taken less carvedilol before and she feels better, more energetic.  Her BPs at home are well controlled and she is able to monitor her heart rate. Plan: Decrease carvedilol 12.5 mg to half tablet twice daily, continue other meds, monitor BPs and heart rate. Check a BMP   Atrial fibrillation: See BB dosing changes,  on Xarelto adjusted for kidney function. ITP, history of anemia: Checking a CBC today. Back pain: Chronic, managing w/ essentially no meds Mild hyperglycemia: Check a A1c Social: Still living independently and driving.  No recent falls. Prevention: Flu shot today.  Will also discussed a Shingrix vaccination she would like to proceed. RX printed. RTC 4-5 months

## 2018-05-02 NOTE — Progress Notes (Signed)
Pre visit review using our clinic review tool, if applicable. No additional management support is needed unless otherwise documented below in the visit note. 

## 2018-05-03 LAB — CBC WITH DIFFERENTIAL/PLATELET
BASOS ABS: 0 10*3/uL (ref 0.0–0.1)
Basophils Relative: 0.2 % (ref 0.0–3.0)
Eosinophils Absolute: 0.2 10*3/uL (ref 0.0–0.7)
Eosinophils Relative: 3.9 % (ref 0.0–5.0)
HCT: 33.8 % — ABNORMAL LOW (ref 36.0–46.0)
Hemoglobin: 11.6 g/dL — ABNORMAL LOW (ref 12.0–15.0)
LYMPHS ABS: 1.6 10*3/uL (ref 0.7–4.0)
Lymphocytes Relative: 30.1 % (ref 12.0–46.0)
MCHC: 34.2 g/dL (ref 30.0–36.0)
MCV: 94.1 fl (ref 78.0–100.0)
MONOS PCT: 7.8 % (ref 3.0–12.0)
Monocytes Absolute: 0.4 10*3/uL (ref 0.1–1.0)
NEUTROS PCT: 58 % (ref 43.0–77.0)
Neutro Abs: 3.2 10*3/uL (ref 1.4–7.7)
PLATELETS: 56 10*3/uL — AB (ref 150.0–400.0)
RBC: 3.6 Mil/uL — ABNORMAL LOW (ref 3.87–5.11)
RDW: 15.3 % (ref 11.5–15.5)
WBC: 5.5 10*3/uL (ref 4.0–10.5)

## 2018-05-03 LAB — BASIC METABOLIC PANEL
BUN: 29 mg/dL — AB (ref 6–23)
CHLORIDE: 106 meq/L (ref 96–112)
CO2: 28 meq/L (ref 19–32)
Calcium: 9.4 mg/dL (ref 8.4–10.5)
Creatinine, Ser: 1.47 mg/dL — ABNORMAL HIGH (ref 0.40–1.20)
GFR: 35.21 mL/min — ABNORMAL LOW (ref >60.00)
GLUCOSE: 101 mg/dL — AB (ref 70–99)
POTASSIUM: 3.9 meq/L (ref 3.5–5.1)
Sodium: 142 mEq/L (ref 135–145)

## 2018-05-03 LAB — HEMOGLOBIN A1C: HEMOGLOBIN A1C: 6.3 % (ref 4.6–6.5)

## 2018-05-03 NOTE — Assessment & Plan Note (Signed)
  HTN: Currently on Lotensin, carvedilol 12.5 mg B.I.D., Lasix.  Reports that has taken less carvedilol before and she feels better, more energetic.  Her BPs at home are well controlled and she is able to monitor her heart rate. Plan: Decrease carvedilol 12.5 mg to half tablet twice daily, continue other meds, monitor BPs and heart rate. Check a BMP   Atrial fibrillation: See BB dosing changes,  on Xarelto adjusted for kidney function. ITP, history of anemia: Checking a CBC today. Back pain: Chronic, managing w/ essentially no meds Mild hyperglycemia: Check a A1c Social: Still living independently and driving.  No recent falls. Prevention: Flu shot today.  Will also discussed a Shingrix vaccination she would like to proceed. RX printed. RTC 4-5 months

## 2018-05-10 ENCOUNTER — Ambulatory Visit (INDEPENDENT_AMBULATORY_CARE_PROVIDER_SITE_OTHER): Payer: Medicare Other | Admitting: *Deleted

## 2018-05-10 DIAGNOSIS — I480 Paroxysmal atrial fibrillation: Secondary | ICD-10-CM | POA: Diagnosis not present

## 2018-05-10 DIAGNOSIS — I442 Atrioventricular block, complete: Secondary | ICD-10-CM

## 2018-05-10 NOTE — Progress Notes (Signed)
Remote pacemaker transmission.   

## 2018-05-11 ENCOUNTER — Other Ambulatory Visit (HOSPITAL_COMMUNITY): Payer: Self-pay | Admitting: Internal Medicine

## 2018-05-18 ENCOUNTER — Telehealth: Payer: Self-pay | Admitting: Internal Medicine

## 2018-05-18 NOTE — Telephone Encounter (Signed)
We recently decrease carvedilol , advise pt BP and heart rate look ok, as long as she stays that way, no change

## 2018-05-18 NOTE — Telephone Encounter (Signed)
Copied from [place] [id number]. Topic: Quick Communication - See Telephone Encounter >> May 18, 2018 10:36 AM Antonieta Iba C wrote: CRM for notification. See Telephone encounter for: 05/18/18.  Pt's BP medication was changed and was asked to call in to update PCP with reading.   117/77 pulse 85  140/83 pulse 77  131/71 pulse 76   Pt says that this is her reading for the last few days.

## 2018-05-19 NOTE — Telephone Encounter (Signed)
Spoke w/ Pt- informed of recommendations. She also informed for the last week she has had soft stools w/ mucus, not diarrhea multiple times daily. She wanted to know if she should schedule an appt or if we could just test stools. Denies fever, chills, n/v.

## 2018-05-19 NOTE — Telephone Encounter (Signed)
Spoke w/ Pt- informed of recommendations. Pt verbalized understanding.  

## 2018-05-19 NOTE — Telephone Encounter (Signed)
rec to watch the situation for now, drink plenty of fluids, if she is not back to normal by next week let us know.  Call anytime if fever, chills, frank diarrhea.

## 2018-06-16 ENCOUNTER — Inpatient Hospital Stay (HOSPITAL_COMMUNITY)
Admission: EM | Admit: 2018-06-16 | Discharge: 2018-06-24 | DRG: 291 | Disposition: A | Discharge status: Home Health | Payer: Medicare Other | Attending: Internal Medicine | Admitting: Internal Medicine

## 2018-06-16 ENCOUNTER — Other Ambulatory Visit: Payer: Self-pay

## 2018-06-16 ENCOUNTER — Emergency Department (HOSPITAL_COMMUNITY): Payer: Medicare Other

## 2018-06-16 ENCOUNTER — Encounter (HOSPITAL_COMMUNITY): Payer: Self-pay | Admitting: Emergency Medicine

## 2018-06-16 DIAGNOSIS — E875 Hyperkalemia: Secondary | ICD-10-CM | POA: Diagnosis not present

## 2018-06-16 DIAGNOSIS — R0602 Shortness of breath: Secondary | ICD-10-CM | POA: Diagnosis not present

## 2018-06-16 DIAGNOSIS — Z950 Presence of cardiac pacemaker: Secondary | ICD-10-CM

## 2018-06-16 DIAGNOSIS — R9431 Abnormal electrocardiogram [ECG] [EKG]: Secondary | ICD-10-CM | POA: Diagnosis not present

## 2018-06-16 DIAGNOSIS — Z86010 Personal history of colonic polyps: Secondary | ICD-10-CM

## 2018-06-16 DIAGNOSIS — I4819 Other persistent atrial fibrillation: Secondary | ICD-10-CM | POA: Diagnosis present

## 2018-06-16 DIAGNOSIS — I5033 Acute on chronic diastolic (congestive) heart failure: Secondary | ICD-10-CM

## 2018-06-16 DIAGNOSIS — Z8051 Family history of malignant neoplasm of kidney: Secondary | ICD-10-CM

## 2018-06-16 DIAGNOSIS — Z961 Presence of intraocular lens: Secondary | ICD-10-CM | POA: Diagnosis present

## 2018-06-16 DIAGNOSIS — N179 Acute kidney failure, unspecified: Secondary | ICD-10-CM | POA: Diagnosis not present

## 2018-06-16 DIAGNOSIS — Z9842 Cataract extraction status, left eye: Secondary | ICD-10-CM

## 2018-06-16 DIAGNOSIS — D696 Thrombocytopenia, unspecified: Secondary | ICD-10-CM

## 2018-06-16 DIAGNOSIS — Z91048 Other nonmedicinal substance allergy status: Secondary | ICD-10-CM

## 2018-06-16 DIAGNOSIS — Z888 Allergy status to other drugs, medicaments and biological substances status: Secondary | ICD-10-CM

## 2018-06-16 DIAGNOSIS — Z885 Allergy status to narcotic agent status: Secondary | ICD-10-CM

## 2018-06-16 DIAGNOSIS — M810 Age-related osteoporosis without current pathological fracture: Secondary | ICD-10-CM | POA: Diagnosis present

## 2018-06-16 DIAGNOSIS — I252 Old myocardial infarction: Secondary | ICD-10-CM

## 2018-06-16 DIAGNOSIS — Z947 Corneal transplant status: Secondary | ICD-10-CM

## 2018-06-16 DIAGNOSIS — I10 Essential (primary) hypertension: Secondary | ICD-10-CM | POA: Diagnosis not present

## 2018-06-16 DIAGNOSIS — I495 Sick sinus syndrome: Secondary | ICD-10-CM | POA: Diagnosis present

## 2018-06-16 DIAGNOSIS — D510 Vitamin B12 deficiency anemia due to intrinsic factor deficiency: Secondary | ICD-10-CM | POA: Diagnosis present

## 2018-06-16 DIAGNOSIS — N183 Chronic kidney disease, stage 3 unspecified: Secondary | ICD-10-CM

## 2018-06-16 DIAGNOSIS — I5021 Acute systolic (congestive) heart failure: Secondary | ICD-10-CM

## 2018-06-16 DIAGNOSIS — E876 Hypokalemia: Secondary | ICD-10-CM | POA: Diagnosis not present

## 2018-06-16 DIAGNOSIS — I959 Hypotension, unspecified: Secondary | ICD-10-CM | POA: Diagnosis not present

## 2018-06-16 DIAGNOSIS — K559 Vascular disorder of intestine, unspecified: Secondary | ICD-10-CM | POA: Diagnosis not present

## 2018-06-16 DIAGNOSIS — Z800 Family history of malignant neoplasm of digestive organs: Secondary | ICD-10-CM

## 2018-06-16 DIAGNOSIS — Z803 Family history of malignant neoplasm of breast: Secondary | ICD-10-CM

## 2018-06-16 DIAGNOSIS — I77819 Aortic ectasia, unspecified site: Secondary | ICD-10-CM | POA: Diagnosis present

## 2018-06-16 DIAGNOSIS — I160 Hypertensive urgency: Secondary | ICD-10-CM

## 2018-06-16 DIAGNOSIS — I130 Hypertensive heart and chronic kidney disease with heart failure and stage 1 through stage 4 chronic kidney disease, or unspecified chronic kidney disease: Secondary | ICD-10-CM | POA: Diagnosis not present

## 2018-06-16 DIAGNOSIS — Z881 Allergy status to other antibiotic agents status: Secondary | ICD-10-CM

## 2018-06-16 DIAGNOSIS — R079 Chest pain, unspecified: Secondary | ICD-10-CM

## 2018-06-16 DIAGNOSIS — N184 Chronic kidney disease, stage 4 (severe): Secondary | ICD-10-CM | POA: Diagnosis present

## 2018-06-16 DIAGNOSIS — Z823 Family history of stroke: Secondary | ICD-10-CM

## 2018-06-16 DIAGNOSIS — Z7901 Long term (current) use of anticoagulants: Secondary | ICD-10-CM

## 2018-06-16 DIAGNOSIS — I2510 Atherosclerotic heart disease of native coronary artery without angina pectoris: Secondary | ICD-10-CM | POA: Diagnosis not present

## 2018-06-16 DIAGNOSIS — Z841 Family history of disorders of kidney and ureter: Secondary | ICD-10-CM

## 2018-06-16 DIAGNOSIS — I428 Other cardiomyopathies: Secondary | ICD-10-CM | POA: Diagnosis present

## 2018-06-16 DIAGNOSIS — Z880 Allergy status to penicillin: Secondary | ICD-10-CM

## 2018-06-16 DIAGNOSIS — I440 Atrioventricular block, first degree: Secondary | ICD-10-CM | POA: Diagnosis present

## 2018-06-16 DIAGNOSIS — M48061 Spinal stenosis, lumbar region without neurogenic claudication: Secondary | ICD-10-CM | POA: Diagnosis present

## 2018-06-16 DIAGNOSIS — Z8249 Family history of ischemic heart disease and other diseases of the circulatory system: Secondary | ICD-10-CM

## 2018-06-16 DIAGNOSIS — Z66 Do not resuscitate: Secondary | ICD-10-CM | POA: Diagnosis present

## 2018-06-16 DIAGNOSIS — I110 Hypertensive heart disease with heart failure: Secondary | ICD-10-CM | POA: Diagnosis not present

## 2018-06-16 DIAGNOSIS — I5043 Acute on chronic combined systolic (congestive) and diastolic (congestive) heart failure: Secondary | ICD-10-CM | POA: Diagnosis not present

## 2018-06-16 DIAGNOSIS — H409 Unspecified glaucoma: Secondary | ICD-10-CM | POA: Diagnosis present

## 2018-06-16 DIAGNOSIS — I5032 Chronic diastolic (congestive) heart failure: Secondary | ICD-10-CM | POA: Diagnosis present

## 2018-06-16 DIAGNOSIS — Z9841 Cataract extraction status, right eye: Secondary | ICD-10-CM

## 2018-06-16 DIAGNOSIS — E785 Hyperlipidemia, unspecified: Secondary | ICD-10-CM | POA: Diagnosis present

## 2018-06-16 DIAGNOSIS — Z90710 Acquired absence of both cervix and uterus: Secondary | ICD-10-CM

## 2018-06-16 LAB — CBC
HCT: 34.9 % — ABNORMAL LOW (ref 36.0–46.0)
HEMOGLOBIN: 10.8 g/dL — AB (ref 12.0–15.0)
MCH: 30.3 pg (ref 26.0–34.0)
MCHC: 30.9 g/dL (ref 30.0–36.0)
MCV: 97.8 fL (ref 80.0–100.0)
Platelets: 61 10*3/uL — ABNORMAL LOW (ref 150–400)
RBC: 3.57 MIL/uL — AB (ref 3.87–5.11)
RDW: 13.3 % (ref 11.5–15.5)
WBC: 4.3 10*3/uL (ref 4.0–10.5)
nRBC: 0 % (ref 0.0–0.2)

## 2018-06-16 LAB — BASIC METABOLIC PANEL
ANION GAP: 11 (ref 5–15)
BUN: 16 mg/dL (ref 8–23)
CO2: 24 mmol/L (ref 22–32)
Calcium: 9.3 mg/dL (ref 8.9–10.3)
Chloride: 107 mmol/L (ref 98–111)
Creatinine, Ser: 1.04 mg/dL — ABNORMAL HIGH (ref 0.44–1.00)
GFR calc non Af Amer: 46 mL/min — ABNORMAL LOW (ref >60)
GFR, EST AFRICAN AMERICAN: 54 mL/min — AB (ref >60)
Glucose, Bld: 106 mg/dL — ABNORMAL HIGH (ref 70–99)
Potassium: 3.1 mmol/L — ABNORMAL LOW (ref 3.5–5.1)
Sodium: 142 mmol/L (ref 135–145)

## 2018-06-16 LAB — BRAIN NATRIURETIC PEPTIDE: B Natriuretic Peptide: 362.2 pg/mL — ABNORMAL HIGH (ref 0.0–100.0)

## 2018-06-16 LAB — I-STAT TROPONIN, ED: Troponin i, poc: 0 ng/mL (ref 0.00–0.08)

## 2018-06-16 LAB — CBG MONITORING, ED: Glucose-Capillary: 91 mg/dL (ref 70–99)

## 2018-06-16 MED ORDER — MAGNESIUM SULFATE 2 GM/50ML IV SOLN
2.0000 g | Freq: Once | INTRAVENOUS | Status: AC
  Administered 2018-06-17: 2 g via INTRAVENOUS
  Filled 2018-06-16: qty 50

## 2018-06-16 MED ORDER — PANTOPRAZOLE SODIUM 40 MG PO TBEC
40.0000 mg | DELAYED_RELEASE_TABLET | Freq: Every day | ORAL | Status: DC
  Administered 2018-06-17 – 2018-06-24 (×8): 40 mg via ORAL
  Filled 2018-06-16 (×9): qty 1

## 2018-06-16 MED ORDER — ACETAMINOPHEN 325 MG PO TABS
650.0000 mg | ORAL_TABLET | ORAL | Status: DC | PRN
  Administered 2018-06-18 – 2018-06-20 (×3): 650 mg via ORAL
  Filled 2018-06-16 (×3): qty 2

## 2018-06-16 MED ORDER — SODIUM CHLORIDE 0.9 % IV SOLN: 250.0000 mL | INTRAVENOUS | Status: DC | PRN

## 2018-06-16 MED ORDER — BENAZEPRIL HCL 20 MG PO TABS
20.0000 mg | ORAL_TABLET | Freq: Every day | ORAL | Status: DC
  Administered 2018-06-17 – 2018-06-21 (×5): 20 mg via ORAL
  Filled 2018-06-16 (×5): qty 1

## 2018-06-16 MED ORDER — POTASSIUM CHLORIDE CRYS ER 20 MEQ PO TBCR
40.0000 meq | EXTENDED_RELEASE_TABLET | Freq: Once | ORAL | Status: AC
  Administered 2018-06-16: 40 meq via ORAL
  Filled 2018-06-16: qty 2

## 2018-06-16 MED ORDER — SODIUM CHLORIDE 0.9% FLUSH
3.0000 mL | Freq: Two times a day (BID) | INTRAVENOUS | Status: DC
  Administered 2018-06-17 – 2018-06-24 (×15): 3 mL via INTRAVENOUS

## 2018-06-16 MED ORDER — ASPIRIN 81 MG PO CHEW
324.0000 mg | CHEWABLE_TABLET | Freq: Once | ORAL | Status: AC
  Administered 2018-06-16: 324 mg via ORAL
  Filled 2018-06-16: qty 4

## 2018-06-16 MED ORDER — FUROSEMIDE 10 MG/ML IJ SOLN
40.0000 mg | Freq: Two times a day (BID) | INTRAMUSCULAR | Status: DC
  Administered 2018-06-17: 40 mg via INTRAVENOUS
  Filled 2018-06-16: qty 4

## 2018-06-16 MED ORDER — BRIMONIDINE TARTRATE 0.2 % OP SOLN
1.0000 [drp] | Freq: Two times a day (BID) | OPHTHALMIC | Status: DC
  Administered 2018-06-17 – 2018-06-24 (×16): 1 [drp] via OPHTHALMIC
  Filled 2018-06-16: qty 5

## 2018-06-16 MED ORDER — CARVEDILOL 25 MG PO TABS: 25.0000 mg | ORAL_TABLET | Freq: Two times a day (BID) | ORAL | Status: DC

## 2018-06-16 MED ORDER — VITAMIN B-12 1000 MCG PO TABS
1000.0000 ug | ORAL_TABLET | Freq: Two times a day (BID) | ORAL | Status: DC
  Administered 2018-06-17 – 2018-06-24 (×15): 1000 ug via ORAL
  Filled 2018-06-16 (×15): qty 1

## 2018-06-16 MED ORDER — SODIUM CHLORIDE 0.9% FLUSH: 3.0000 mL | INTRAVENOUS | Status: DC | PRN

## 2018-06-16 MED ORDER — POLYVINYL ALCOHOL 1.4 % OP SOLN
1.0000 [drp] | Freq: Two times a day (BID) | OPHTHALMIC | Status: DC
  Administered 2018-06-17 – 2018-06-24 (×15): 1 [drp] via OPHTHALMIC
  Filled 2018-06-16: qty 15

## 2018-06-16 MED ORDER — FUROSEMIDE 10 MG/ML IJ SOLN
40.0000 mg | Freq: Once | INTRAMUSCULAR | Status: AC
  Administered 2018-06-16: 40 mg via INTRAVENOUS
  Filled 2018-06-16: qty 4

## 2018-06-16 MED ORDER — RIVAROXABAN 15 MG PO TABS
15.0000 mg | ORAL_TABLET | Freq: Every day | ORAL | Status: DC
  Administered 2018-06-17 – 2018-06-23 (×8): 15 mg via ORAL
  Filled 2018-06-16 (×8): qty 1

## 2018-06-16 MED ORDER — TIMOLOL MALEATE 0.5 % OP SOLN
1.0000 [drp] | Freq: Two times a day (BID) | OPHTHALMIC | Status: DC
  Administered 2018-06-17 – 2018-06-24 (×16): 1 [drp] via OPHTHALMIC
  Filled 2018-06-16: qty 5

## 2018-06-16 MED ORDER — DIPHENHYDRAMINE HCL 25 MG PO CAPS
25.0000 mg | ORAL_CAPSULE | Freq: Every evening | ORAL | Status: DC | PRN
  Administered 2018-06-20 – 2018-06-23 (×4): 25 mg via ORAL
  Filled 2018-06-16 (×4): qty 1

## 2018-06-16 MED ORDER — ONDANSETRON HCL 4 MG/2ML IJ SOLN: 4.0000 mg | Freq: Four times a day (QID) | INTRAMUSCULAR | Status: DC | PRN

## 2018-06-16 NOTE — ED Provider Notes (Signed)
[name] Provider Note   CSN: [id number] Arrival date & time: 06/16/18  1451     History   Chief Complaint Chief Complaint  Patient presents with  . Shortness of Breath  . Chest Pain    HPI [name] is a 84 y.o. female.  The history is provided by the patient and a friend. No language interpreter was used.  Shortness of Breath  This is a new problem. The average episode lasts 3 days. The problem occurs frequently.The current episode started more than 2 days ago. Associated symptoms include chest pain and leg swelling. Pertinent negatives include no fever, no headaches, no sore throat, no ear pain, no cough, no sputum production, no wheezing, no vomiting, no abdominal pain and no rash. Associated medical issues include CAD, heart failure and past MI.    Past Medical History:  Diagnosis Date  . AV block, 1st degree   . Bradycardia   . CAD (coronary artery disease)    Non ST elevation 2005 ; LHC in 6/05 in setting of NSTEMI: ant apical AK and inf-apical AK, EF 29%, dOM2 70-80% (small); findings c/w Tako-Tsubo CM  . Cardiomyopathy, nonischemic ([place]) 04/2008   Likely Tako-Tsubo. Resolved. --dx'd on cath 2005. EF 29% with minimal distal CAD (small OM1 70-80).  Echo 10/09 EF 55%;  echo 01/26/12: EF 55%, mild LAE, PASP 34.  . Colitis, ischemic ([place])   . Complication of anesthesia    post anesthesia excessive somnulence  . Compression fracture of C-spine ([place]) 10/10/2011   Fell at home, tx at [facility]  . Diarrhea associated with pseudomembranous colitis 6/11-17/2013   [facility]  . Dyslipidemia   . Fall    with non-healing rib fractures  . Glaucoma   . Granuloma annulare   . History of phlebitis   . Hypertension    SEVERE  . Idiopathic thrombocytopenic purpura (ITP) (HCC)   . Lower extremity edema   . Lumbar stenosis 2004   DR. MARK ROY  . Multiple pelvic fractures 05/2012   Dr Adaline Sill , [facility]  . Osteoporosis      Patient Active Problem List   Diagnosis Date Noted  . Hypokalemia 06/16/2018  . Complete heart block ([place]) 11/06/2016  . Palpitations 10/27/2016  . Coronary artery disease 04/29/2016  . Indigestion 09/25/2015  . PCP NOTES >>>>>>>>>>>>>>>>>> 04/09/2015  . *CKD (chronic kidney disease) stage 3, GFR 30-59 ml/min 08/14/2013  . Ectopic atrial rhythm 08/14/2013  . Acute on chronic diastolic CHF (congestive heart failure) ([place]) 03/03/2012  . Fractures involving multiple body regions 12/22/2011  . Idiopathic thrombocytopenic purpura ([place]) 09/25/2011  . Edema   . Chest pain 09/15/2010  . ARTHRALGIA 11/14/2009  . CARDIOMYOPATHY, PRIMARY, DILATED 10/07/2009  . URINARY INCONTINENCE 03/11/2009  . *ANEMIA, PERNICIOUS 05/22/2008  . Takotsubo syndrome 05/22/2008  . SUPRAVENTRICULAR TACHYCARDIA, PAROXYSMAL, HX OF 05/22/2008  . POPLITEAL CYST 11/07/2007  . *Essential hypertension 09/19/2007  . PHLEBITIS 09/19/2007  . ISCHEMIC COLITIS, HX OF 09/19/2007  . Thrombocytopenia ([place]) 08/19/2006  . 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 *Osteoporosis 08/19/2006  . COLONIC POLYPS, HX OF 08/19/2006    Past Surgical History:  Procedure Laterality Date  . ABDOMINAL HYSTERECTOMY    . APPENDECTOMY    . BACK SURGERY  10/2004   LS Disc 4-5 no sx.  Marland Kitchen CARDIAC CATHETERIZATION     12/2003  . CATARACT EXTRACTION, BILATERAL    . COLONOSCOPY  09/2005   adenoma  . COLONOSCOPY W/ POLYPECTOMY    .  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 PACEMAKER IMPLANT N/A 11/06/2016   Procedure: Pacemaker Implant;  Surgeon: Evans Lance, MD;  Location: [facility];  Service: Cardiovascular;  Laterality: N/A;  . rectal bleed  05-01-06   colonoscopy-ischemic colitis  . ROTATOR CUFF REPAIR    . VERTEBROPLASTY     Dr Gladstone Lighter     OB History   None      Home Medications    Prior to Admission medications   Medication Sig Start Date End Date Taking? Authorizing Provider  acetaminophen (TYLENOL) 325 MG tablet  Take 325-650 mg by mouth every 6 (six) hours as needed for mild pain or headache.   Yes [provider]  benazepril (LOTENSIN) 20 MG tablet TAKE 1 TABLET DAILY. TAKE AN EXTRA 20 MG (1 TABLET) IF YOUR SYSTOLIC (TOP NUMBER) OF BLOOD PRESSURE IS 180 OR HIGHER Patient taking differently: Take 20 mg by mouth See admin instructions. Take 20 mg by mouth with supper and an extra 20 mg if Systolic (top number) of blood pressure is 180 or higher 05/12/18  Yes Bensimhon, Shaune Pascal, MD  beta carotene w/minerals (OCUVITE) tablet Take 1 tablet by mouth daily.    Yes [provider]  brimonidine (ALPHAGAN) 0.2 % ophthalmic solution Place 1 drop into the right eye 2 (two) times daily.     Yes [provider]  Calcium Carbonate-Vit D-Min (CALCIUM 1200 PO) Take 1,200 mg by mouth daily with breakfast.   Yes [provider]  carboxymethylcellulose (REFRESH TEARS) 0.5 % SOLN Place 1 drop into both eyes 2 (two) times daily.    Yes [provider]  carvedilol (COREG) 12.5 MG tablet Take 6.25-12.5 mg by mouth See admin instructions. Take 6.25 mg by mouth in the morning and 12.5 mg at 6 PM   Yes [provider]  Cholecalciferol (VITAMIN D3) 3000 UNITS TABS Take 3,000 Units by mouth daily.    Yes [provider]  diphenhydrAMINE (BENADRYL) 25 MG tablet Take 25 mg by mouth at bedtime as needed for sleep (and sinus issues).    Yes [provider]  docusate sodium (COLACE) 100 MG capsule Take 100 mg by mouth daily as needed for mild constipation. Stool softener    Yes [provider]  furosemide (LASIX) 40 MG tablet Take 1 tablet (40 mg total) by mouth daily. Take an additional 20 mg (1/2 tab) if weight is 174 or more Patient taking differently: Take 40 mg by mouth See admin instructions. Take 40 mg by mouth once a day and an additional 20 mg if weight is 174 pounds or more 03/19/17  Yes Bensimhon, Shaune Pascal, MD  ibuprofen (ADVIL,MOTRIN) 200 MG tablet Take  200-400 mg by mouth every 8 (eight) hours as needed for headache or mild pain.    Yes [provider]  Misc Natural Products (OSTEO BI-FLEX ADV JOINT SHIELD) TABS Take 1 tablet by mouth daily.    Yes [provider]  omeprazole (PRILOSEC) 20 MG capsule Take 20 mg by mouth daily.     Yes [provider]  Rivaroxaban (XARELTO) 15 MG TABS tablet Take 1 tablet (15 mg total) by mouth daily with supper. 04/01/18  Yes Evans Lance, MD  timolol (TIMOPTIC) 0.5 % ophthalmic solution Place 1 drop into the right eye 2 (two) times daily. 05/31/18  Yes [provider]  vitamin B-12 (CYANOCOBALAMIN) 1000 MCG tablet Take 1,000 mcg by mouth 2 (two) times daily.  Yes [provider]    Family History Family History  Problem Relation Age of Onset  . Coronary artery disease Mother   . Heart failure Mother   . Renal cancer Mother   . Kidney disease Mother   . Stroke Father        in his 32s  . Colon cancer Father   . Breast cancer Sister         X 2  . Rectal cancer Sister   . Breast cancer Sister   . Diabetes Neg Hx     Social History Social History   Tobacco Use  . Smoking status: Never Smoker  . Smokeless tobacco: Never Used  Substance Use Topics  . Alcohol use: No  . Drug use: No     Allergies   Morphine and related; Oxycodone; Penicillins; Clindamycin/lincomycin; Colchicine; Hydroxychloroquine; Norvasc [amlodipine besylate]; Ofloxacin; Streptomycin; Tape; Tramadol; Alendronate sodium; Amiodarone; and Plaquenil [hydroxychloroquine sulfate]   Review of Systems Review of Systems  Constitutional: Positive for activity change, appetite change and fatigue. Negative for chills, diaphoresis and fever.  HENT: Negative for ear pain and sore throat.   Eyes: Negative for pain and visual disturbance.  Respiratory: Positive for shortness of breath. Negative for cough, sputum production and wheezing.   Cardiovascular: Positive for chest pain and leg  swelling. Negative for palpitations.  Gastrointestinal: Negative for abdominal pain and vomiting.  Genitourinary: Negative for dysuria and hematuria.  Musculoskeletal: Negative for arthralgias and back pain.  Skin: Negative for color change and rash.  Neurological: Negative for seizures, syncope and headaches.  All other systems reviewed and are negative.  Physical Exam Updated Vital Signs BP (!) 171/96 (BP Location: Right Arm)   Pulse 100   Temp 98 F (36.7 C) (Oral)   Resp 18   Ht 5\' 4"  (1.626 m)   Wt 77.1 kg   SpO2 97%   BMI 29.16 kg/m   Physical Exam  Constitutional: She appears well-developed and well-nourished. No distress.  HENT:  Head: Normocephalic and atraumatic.  Eyes: Pupils are equal, round, and reactive to light. Conjunctivae and EOM are normal.  Neck: Normal range of motion. Neck supple.  Cardiovascular: Normal rate and regular rhythm.  No murmur heard. Pulmonary/Chest: Effort normal and breath sounds normal. No accessory muscle usage. No respiratory distress.  Abdominal: Soft. Bowel sounds are normal. She exhibits no distension. There is no tenderness.  Musculoskeletal: She exhibits no edema.  Neurological: She is alert.  Skin: Skin is warm and dry.  Psychiatric: She has a normal mood and affect.  Nursing note and vitals reviewed.  ED Treatments / Results  Labs (all labs ordered are listed, but only abnormal results are displayed) Labs Reviewed  BASIC METABOLIC PANEL - Abnormal; Notable for the following components:      Result Value   Potassium 3.1 (*)    Glucose, Bld 106 (*)    Creatinine, Ser 1.04 (*)    GFR calc non Af Amer 46 (*)    GFR calc Af Amer 54 (*)    All other components within normal limits  CBC - Abnormal; Notable for the following components:   RBC 3.57 (*)    Hemoglobin 10.8 (*)    HCT 34.9 (*)    Platelets 61 (*)    All other components within normal limits  BRAIN NATRIURETIC PEPTIDE - Abnormal; Notable for the following  components:   B Natriuretic Peptide 362.2 (*)    All other components within normal limits  I-STAT  TROPONIN, ED  CBG MONITORING, ED    EKG EKG Interpretation  Date/Time:  Thursday June 16 2018 16:05:09 EST Ventricular Rate:  100 PR Interval:    QRS Duration: 84 QT Interval:  360 QTC Calculation: 464 R Axis:   -9 Text Interpretation:  Atrial fibrillation Cannot rule out Anterior infarct , age undetermined Abnormal ECG No acute changes No significant change since last tracing Confirmed by Varney Biles [phone]) on 06/16/2018 3:38:57 PM   Radiology Dg Chest 2 View  Result Date: 06/16/2018 CLINICAL DATA:  Four days of chest pain, shortness of breath and bilateral lower extremity swelling. EXAM: CHEST - 2 VIEW COMPARISON:  07/20/2017 FINDINGS: Cardiac pacemaker in stable position. Enlarged cardiac silhouette. Calcific atherosclerotic disease and tortuosity of the aorta. Mild interstitial pulmonary edema. Small bilateral pleural effusions. Stable appearance of T12 vertebroplasty. Stable posttraumatic changes in the right chest wall. Soft tissues are grossly normal. IMPRESSION: Enlarged cardiac silhouette with mild interstitial pulmonary edema and small bilateral pleural effusions. Electronically Signed   By: Fidela Salisbury M.D.   On: 06/16/2018 15:42    Procedures Procedures (including critical care time)  Medications Ordered in ED Medications  aspirin chewable tablet 324 mg (324 mg Oral Given 06/16/18 1759)  furosemide (LASIX) injection 40 mg (40 mg Intravenous Given 06/16/18 1822)  potassium chloride SA (K-DUR,KLOR-CON) CR tablet 40 mEq (40 mEq Oral Given 06/16/18 2158)     Initial Impression / Assessment and Plan / ED Course  I have reviewed the triage vital signs and the nursing notes.  Pertinent labs & imaging results that were available during my care of the patient were reviewed by me and considered in my medical decision making (see chart for details).     Patient  is a 84 y.o. female with PMHX of diastolic HF, HTN presenting for shortness of breath that has gotten worse over the last three days. CXR shows interstitial edema and bilateral pleural effusions. Labs remarkable for elevated BNP.  Patient denying chest pain, aspirin given.  Lasix 40 mg given IV as this is patient's p.o. home dose. Patient is not requiring supplemental oxygen.  Patient able to ambulate without difficulty without hypoxic event. Cardiology was consulted and came to see patient at the bedside.  Patient will be admitted for further diuresis and management.  Final Clinical Impressions(s) / ED Diagnoses   Final diagnoses:  Acute systolic congestive heart failure [facility])    ED Discharge Orders    None       Erskine Squibb, MD 06/16/18 [id number]    Varney Biles, MD 06/17/18 0045

## 2018-06-16 NOTE — ED Notes (Signed)
Pt stands on own ability. Pt walks with cane around nurse's station and back to room. Sat remains 96-100% throughout trip. Pt is very steady and coordinated on feet.

## 2018-06-16 NOTE — Consult Note (Signed)
Cardiology Consultation:   Patient ID: LYNESHA BANGO; [id number]; [birth year]   Admit date: 06/16/2018 Date of Consult: 06/16/2018  Primary Care Provider: Colon Branch, MD Primary Cardiologist: No primary care provider on file. Primary Electrophysiologist:  Cristopher Peru, MD  Chief Complaint: Dyspnea  Patient Profile:   COURTLYN AKI is a 84 y.o. female with heart failure with preserved ejection fraction, coronary artery disease, paroxysmal atrial fibrillation on rivaroxaban, sick sinus syndrome with symptomatic sinus pauses status-post dual chamber pacemaker implantation in 2018, hypertension,  who is being seen today for the evaluation of progressive exertional dyspnea at the request of Dr. Myna Hidalgo.  History of Present Illness:   RAIZY AUZENNE reports worsening dyspnea for the past two months. This accelerated over the past week in severity. The dyspnea is worsened by laying down flat and has been accompanied by increased lower extremity edema. Her weight has been around 170 pounds and she has remained on her chronic home dose of furosemide. She has had some intermittent and transient low grade chest discomfort as well. Due to her dyspnea she presented to the emergency department tonight for further evaluation.  Past Medical History:  Diagnosis Date  . AV block, 1st degree   . Bradycardia   . CAD (coronary artery disease)    Non ST elevation 2005 ; LHC in 6/05 in setting of NSTEMI: ant apical AK and inf-apical AK, EF 29%, dOM2 70-80% (small); findings c/w Tako-Tsubo CM  . Cardiomyopathy, nonischemic ([place]) 04/2008   Likely Tako-Tsubo. Resolved. --dx'd on cath 2005. EF 29% with minimal distal CAD (small OM1 70-80).  Echo 10/09 EF 55%;  echo 01/26/12: EF 55%, mild LAE, PASP 34.  . Colitis, ischemic ([place])   . Complication of anesthesia    post anesthesia excessive somnulence  . Compression fracture of C-spine ([place]) 10/10/2011   Fell at home, tx at [facility]  . Diarrhea associated  with pseudomembranous colitis 6/11-17/2013   [facility]  . Dyslipidemia   . Fall    with non-healing rib fractures  . Glaucoma   . Granuloma annulare   . History of phlebitis   . Hypertension    SEVERE  . Idiopathic thrombocytopenic purpura (ITP) ([facility])   . Lower extremity edema   . Lumbar stenosis 2004   DR. MARK ROY  . Multiple pelvic fractures 05/2012   Dr Adaline Sill , [facility]  . Osteoporosis     Past Surgical History:  Procedure Laterality Date  . ABDOMINAL HYSTERECTOMY    . APPENDECTOMY    . BACK SURGERY  10/2004   LS Disc 4-5 no sx.  Marland Kitchen CARDIAC CATHETERIZATION     12/2003  . CATARACT EXTRACTION, BILATERAL    . COLONOSCOPY  09/2005   adenoma  . COLONOSCOPY W/ POLYPECTOMY    . 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 PACEMAKER IMPLANT N/A 11/06/2016   Procedure: Pacemaker Implant;  Surgeon: Evans Lance, MD;  Location: [place] [facility];  Service: Cardiovascular;  Laterality: N/A;  . rectal bleed  05-01-06   colonoscopy-ischemic colitis  . ROTATOR CUFF REPAIR    . VERTEBROPLASTY     Dr Gladstone Lighter     Inpatient Medications: Scheduled Meds: . potassium chloride  40 mEq Oral Once   Continuous Infusions:  PRN Meds:   Home Meds: Prior to Admission medications   Medication Sig Start Date End Date Taking? Authorizing Provider  acetaminophen (TYLENOL) 325 MG tablet Take 325-650 mg by mouth every 6 (six) hours as  needed for mild pain or headache.   Yes [provider]  benazepril (LOTENSIN) 20 MG tablet TAKE 1 TABLET DAILY. TAKE AN EXTRA 20 MG (1 TABLET) IF YOUR SYSTOLIC (TOP NUMBER) OF BLOOD PRESSURE IS 180 OR HIGHER Patient taking differently: Take 20 mg by mouth See admin instructions. Take 20 mg by mouth with supper and an extra 20 mg if Systolic (top number) of blood pressure is 180 or higher 05/12/18  Yes Bensimhon, Shaune Pascal, MD  beta carotene w/minerals (OCUVITE) tablet Take 1 tablet by mouth daily.    Yes [provider]  brimonidine  (ALPHAGAN) 0.2 % ophthalmic solution Place 1 drop into the right eye 2 (two) times daily.     Yes [provider]  Calcium Carbonate-Vit D-Min (CALCIUM 1200 PO) Take 1,200 mg by mouth daily with breakfast.   Yes [provider]  carboxymethylcellulose (REFRESH TEARS) 0.5 % SOLN Place 1 drop into both eyes 2 (two) times daily.    Yes [provider]  carvedilol (COREG) 12.5 MG tablet Take 6.25-12.5 mg by mouth See admin instructions. Take 6.25 mg by mouth in the morning and 12.5 mg at 6 PM   Yes [provider]  Cholecalciferol (VITAMIN D3) 3000 UNITS TABS Take 3,000 Units by mouth daily.    Yes [provider]  diphenhydrAMINE (BENADRYL) 25 MG tablet Take 25 mg by mouth at bedtime as needed for sleep (and sinus issues).    Yes [provider]  docusate sodium (COLACE) 100 MG capsule Take 100 mg by mouth daily as needed for mild constipation. Stool softener    Yes [provider]  furosemide (LASIX) 40 MG tablet Take 1 tablet (40 mg total) by mouth daily. Take an additional 20 mg (1/2 tab) if weight is 174 or more Patient taking differently: Take 40 mg by mouth See admin instructions. Take 40 mg by mouth once a day and an additional 20 mg if weight is 174 pounds or more 03/19/17  Yes Bensimhon, Shaune Pascal, MD  ibuprofen (ADVIL,MOTRIN) 200 MG tablet Take 200-400 mg by mouth every 8 (eight) hours as needed for headache or mild pain.    Yes [provider]  Misc Natural Products (OSTEO BI-FLEX ADV JOINT SHIELD) TABS Take 1 tablet by mouth daily.    Yes [provider]  omeprazole (PRILOSEC) 20 MG capsule Take 20 mg by mouth daily.     Yes [provider]  Rivaroxaban (XARELTO) 15 MG TABS tablet Take 1 tablet (15 mg total) by mouth daily with supper. 04/01/18  Yes Evans Lance, MD  timolol (TIMOPTIC) 0.5 % ophthalmic solution Place 1 drop into the right eye 2 (two) times daily. 05/31/18  Yes [provider]    vitamin B-12 (CYANOCOBALAMIN) 1000 MCG tablet Take 1,000 mcg by mouth 2 (two) times daily.    Yes [provider]    Allergies:    Allergies  Allergen Reactions  . Morphine And Related Anaphylaxis and Nausea Only    Per family, made her violently ill  . Oxycodone Other (See Comments)    Dizziness   . Penicillins Swelling and Other (See Comments)    Joint edema Has patient had a PCN reaction causing immediate rash, facial/tongue/throat swelling, SOB or lightheadedness with hypotension: Yes Has patient had a PCN reaction causing severe rash involving mucus membranes or skin necrosis: No Has patient had a PCN reaction that required hospitalization: No Has patient had a PCN reaction occurring within the last 10  years: No If all of the above answers are "NO", then may proceed with Cephalosporin use.   . Clindamycin/Lincomycin Hives and Itching  . Colchicine Other (See Comments)    Hair loss  . Hydroxychloroquine Other (See Comments)    Unknown reaction  . Norvasc [Amlodipine Besylate] Other (See Comments)    Reaction not recalled by the patient ??  . Ofloxacin Other (See Comments)    Unknown   . Streptomycin Other (See Comments)    "was a long time ago" reaction not recalled  . Tape Other (See Comments)    SKIN IS VERY THIN AND WILL TEAR AND BRUISE EASILY!!  . Tramadol Itching  . Alendronate Sodium Palpitations  . Amiodarone Rash and Other (See Comments)    Possible rash and nervousness per patient  . Plaquenil [Hydroxychloroquine Sulfate] Rash    Social History:   Social History   Socioeconomic History  . Marital status: Widowed    Spouse name: Not on file  . Number of children: 1  . Years of education: Not on file  . Highest education level: Not on file  Occupational History  . Occupation: retired    Fish farm manager: RETIRED  Social Needs  . Financial resource strain: Not on file  . Food insecurity:    Worry: Not on file    Inability: Not on file  .  Transportation needs:    Medical: Not on file    Non-medical: Not on file  Tobacco Use  . Smoking status: Never Smoker  . Smokeless tobacco: Never Used  Substance and Sexual Activity  . Alcohol use: No  . Drug use: No  . Sexual activity: Not Currently  Lifestyle  . Physical activity:    Days per week: Not on file    Minutes per session: Not on file  . Stress: Not on file  Relationships  . Social connections:    Talks on phone: Not on file    Gets together: Not on file    Attends religious service: Not on file    Active member of club or organization: Not on file    Attends meetings of clubs or organizations: Not on file    Relationship status: Not on file  . Intimate partner violence:    Fear of current or ex partner: Not on file    Emotionally abused: Not on file    Physically abused: Not on file    Forced sexual activity: Not on file  Other Topics Concern  . Not on file  Social History Narrative   Widowed   Lives by herself in a town house, drives    Son Yalitza Teed     Family History:   The patient's family history includes Breast cancer in her sister and sister; Colon cancer in her father; Coronary artery disease in her mother; Heart failure in her mother; Kidney disease in her mother; Rectal cancer in her sister; Renal cancer in her mother; Stroke in her father. There is no history of Diabetes.  ROS:  Please see the history of present illness.  All other ROS reviewed and negative.     Physical Exam/Data:   Vitals:   06/16/18 1745 06/16/18 1800 06/16/18 1823 06/16/18 1900  BP: (!) 150/88 (!) 169/79  (!) 155/85  Pulse: 95 88  90  Resp: 14 16  16   Temp:      TempSrc:      SpO2: 96% 98%  97%  Weight:   77.1 kg  Intake/Output Summary (Last 24 hours) at 06/16/2018 2145 Last data filed at 06/16/2018 2007 Gross per 24 hour  Intake -  Output 800 ml  Net -800 ml   Filed Weights   06/16/18 1823  Weight: 77.1 kg   Body mass index is 29.18 kg/m.  General:  Well developed, well nourished, in no acute distress. Head: Normocephalic, atraumatic, sclera non-icteric, no xanthomas, nares are without discharge.  Neck: Negative for carotid bruits. JVD elevated. Lungs: Clear bilaterally to auscultation without wheezes, rales, or rhonchi. Breathing is unlabored. Heart: Irregularly irregular, No murmurs, rubs, or gallops appreciated. Abdomen: Soft, non-tender, non-distended with normoactive bowel sounds. No hepatomegaly. No rebound/guarding. No obvious abdominal masses. Msk:  Strength and tone appear normal for age. Extremities: No clubbing or cyanosis. 2+ symmetric edema.  Distal pedal pulses are 2+ and equal bilaterally. Neuro: Alert and oriented X 3. No facial asymmetry. No focal deficit. Moves all extremities spontaneously. Psych:  Responds to questions appropriately with a normal affect.  EKG:  The EKG was personally reviewed and demonstrates atrial fibrillation with a ventricular response of 100 beats per minute, subtle ST depressions in the lateral precordial leads.   Relevant CV Studies:  08/14/2013 Transthoracic echocardiogram personally reviewed. The left ventricular ejection fraction is 55-60%. The ascending aorta is dilated to 4.1 centimeters. No aortic stenosis. There is mild mitral annular calcification with no significant mitral regurgitation. Right ventricular systolic function is normal with trivial TR.   11/03/2016 Telemetry revealed 6.5 second pause followed by pacemaker implantation on 11/06/2016.   Her most recent ambulatory monitoring demonstrated persist AF since 09/2017.     Laboratory Data:  Chemistry Recent Labs  Lab 06/16/18 1608  NA 142  K 3.1*  CL 107  CO2 24  GLUCOSE 106*  BUN 16  CREATININE 1.04*  CALCIUM 9.3  GFRNONAA 46*  GFRAA 54*  ANIONGAP 11    No results for input(s): PROT, ALBUMIN, AST, ALT, ALKPHOS, BILITOT in the last 168 hours. Hematology Recent Labs  Lab 06/16/18 1608  WBC 4.3  RBC 3.57*  HGB  10.8*  HCT 34.9*  MCV 97.8  MCH 30.3  MCHC 30.9  RDW 13.3  PLT 61*   Cardiac EnzymesNo results for input(s): TROPONINI in the last 168 hours.  Recent Labs  Lab 06/16/18 1615  TROPIPOC 0.00    BNP Recent Labs  Lab 06/16/18 1608  BNP 362.2*    DDimer No results for input(s): DDIMER in the last 168 hours.  Radiology/Studies:  Dg Chest 2 View  Result Date: 06/16/2018 CLINICAL DATA:  Four days of chest pain, shortness of breath and bilateral lower extremity swelling. EXAM: CHEST - 2 VIEW COMPARISON:  07/20/2017 FINDINGS: Cardiac pacemaker in stable position. Enlarged cardiac silhouette. Calcific atherosclerotic disease and tortuosity of the aorta. Mild interstitial pulmonary edema. Small bilateral pleural effusions. Stable appearance of T12 vertebroplasty. Stable posttraumatic changes in the right chest wall. Soft tissues are grossly normal. IMPRESSION: Enlarged cardiac silhouette with mild interstitial pulmonary edema and small bilateral pleural effusions. Electronically Signed   By: Fidela Salisbury M.D.   On: 06/16/2018 15:42    Assessment and Plan:   SUMMERLYNN GLAUSER is a 84 y.o. female with heart failure with preserved ejection fraction, coronary artery disease, paroxysmal atrial fibrillation on rivaroxaban, sick sinus syndrome with symptomatic sinus pauses status-post dual chamber pacemaker implantation in 2018, hypertension,  who is being seen today for the evaluation of progressive exertional dyspnea at the request of Dr. Myna Hidalgo.  1. Acute on chronic diastolic  heart failure. 2. Persistent atrial fibrillation since 09/2017 (previously paroxysmal).  3. Uncontrolled essential hypertension 4. Sick sinus syndrome status-post dual-chamber pacemaker implantation with normal device function   She presents with worsening symptoms of heart failure with elevated BNP, lower extremity edema, and pulmonary edema. She would benefit from admission and diuresis. It is unclear if progression  of her cardiomyopathy, increasing atrial fibrillation burden with progression from paroxysmal to persistent AF, or inadequate blood pressure control is the culprit for her exacerbation but we should address each possibility.   - After potassium and magnesium are adequately supplemented, would diurese with 40 mg IV furosemide  - Obtain transthoracic echocardiogram  - Would increase carvedilol to 25 mg po bid for blood pressure and AF heart rate response.  - Continue benazepril 20 mg po daily - Most recent device interrogation demonstrated persistent AF since 10/29. It may be reasonable to consider AAD + cardioversion as with her diastolic dysfunction AF may be driving her HF syndrome - Continue rivaroxaban 15 mg po daily - HOLD IBUPROFEN  Thank you for this interesting consultation we will continue to follow.  For questions or updates, please contact [place] Please consult [email or web address] for contact info under Cardiology/STEMI.    Signed, Perley Jain, MD  06/16/2018 9:45 PM

## 2018-06-16 NOTE — H&P (Signed)
History and Physical    PANG ROBERS OAC:[id number] DOB: 12/15/1922 DOA: 06/16/2018  PCP: Colon Branch, MD   Patient coming from: Home   Chief Complaint: SOB, nausea, swelling   HPI: KATLIN BORTNER is a 84 y.o. female with medical history significant for coronary artery disease, atrial fibrillation on Xarelto, hypertension, chronic diastolic CHF, chronic kidney disease stage III, ITP, pernicious anemia, and sick sinus syndrome with pacer, now presenting to the emergency department for evaluation of progressive shortness of breath, nausea, swelling, and mild intermittent chest discomfort over the past several days.  She reports a mild cough, denies fevers or chills, reports her dyspnea is worse with exertion, and describes her chest discomfort as mild, intermittent, and without any definite alleviating or exacerbating factors identified.  She reports consistent adherence with her antihypertensives, diuretic, and Xarelto.  Denies any melena or hematochezia.  ED Course: Upon arrival to the ED, patient is found to be afebrile, saturating well on room air, slightly tachypneic, and hypertensive to 170s over low 100s.  EKG features atrial fibrillation and chest x-ray is notable for cardiomegaly with mild interstitial edema and small bilateral pleural effusions.  Chemistry panel is notable for potassium 3.1 and creatinine 1.04, lower than priors.  CBC features a hemoglobin of 10.8 and platelets of 61,000.  Troponin is 0.00 and BNP is elevated to 362.  Cardiology was consulted by the ED physician and recommended medical admission with cardiology to consult.  Patient was treated with 324 mg of aspirin, 40 mg IV Lasix, and 40 mEq oral potassium in the ED.  She remains hypertensive, dyspneic but not in acute distress, and will be observed on the telemetry unit for ongoing evaluation and management.  Review of Systems:  All other systems reviewed and apart from HPI, are negative.  Past Medical History:    Diagnosis Date  . AV block, 1st degree   . Bradycardia   . CAD (coronary artery disease)    Non ST elevation 2005 ; LHC in 6/05 in setting of NSTEMI: ant apical AK and inf-apical AK, EF 29%, dOM2 70-80% (small); findings c/w Tako-Tsubo CM  . Cardiomyopathy, nonischemic ([place]) 04/2008   Likely Tako-Tsubo. Resolved. --dx'd on cath 2005. EF 29% with minimal distal CAD (small OM1 70-80).  Echo 10/09 EF 55%;  echo 01/26/12: EF 55%, mild LAE, PASP 34.  . Colitis, ischemic ([place])   . Complication of anesthesia    post anesthesia excessive somnulence  . Compression fracture of C-spine ([place]) 10/10/2011   Fell at home, tx at [facility]  . Diarrhea associated with pseudomembranous colitis 6/11-17/2013   [facility]  . Dyslipidemia   . Fall    with non-healing rib fractures  . Glaucoma   . Granuloma annulare   . History of phlebitis   . Hypertension    SEVERE  . Idiopathic thrombocytopenic purpura (ITP) (HCC)   . Lower extremity edema   . Lumbar stenosis 2004   DR. MARK ROY  . Multiple pelvic fractures 05/2012   Dr Adaline Sill , [facility]  . Osteoporosis     Past Surgical History:  Procedure Laterality Date  . ABDOMINAL HYSTERECTOMY    . APPENDECTOMY    . BACK SURGERY  10/2004   LS Disc 4-5 no sx.  Marland Kitchen CARDIAC CATHETERIZATION     12/2003  . CATARACT EXTRACTION, BILATERAL    . COLONOSCOPY  09/2005   adenoma  . COLONOSCOPY W/ POLYPECTOMY    . 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  PACEMAKER IMPLANT N/A 11/06/2016   Procedure: Pacemaker Implant;  Surgeon: Evans Lance, MD;  Location: [place] [facility];  Service: Cardiovascular;  Laterality: N/A;  . rectal bleed  05-01-06   colonoscopy-ischemic colitis  . ROTATOR CUFF REPAIR    . VERTEBROPLASTY     Dr Gladstone Lighter     reports that she has never smoked. She has never used smokeless tobacco. She reports that she does not drink alcohol or use drugs.  Allergies  Allergen Reactions  . Morphine And Related Anaphylaxis and Nausea Only    Per  family, made her violently ill  . Oxycodone Other (See Comments)    Dizziness   . Penicillins Swelling and Other (See Comments)    Joint edema Has patient had a PCN reaction causing immediate rash, facial/tongue/throat swelling, SOB or lightheadedness with hypotension: Yes Has patient had a PCN reaction causing severe rash involving mucus membranes or skin necrosis: No Has patient had a PCN reaction that required hospitalization: No Has patient had a PCN reaction occurring within the last 10 years: No If all of the above answers are "NO", then may proceed with Cephalosporin use.   . Clindamycin/Lincomycin Hives and Itching  . Colchicine Other (See Comments)    Hair loss  . Hydroxychloroquine Other (See Comments)    Unknown reaction  . Norvasc [Amlodipine Besylate] Other (See Comments)    Reaction not recalled by the patient ??  . Ofloxacin Other (See Comments)    Unknown   . Streptomycin Other (See Comments)    "was a long time ago" reaction not recalled  . Tape Other (See Comments)    SKIN IS VERY THIN AND WILL TEAR AND BRUISE EASILY!!  . Tramadol Itching  . Alendronate Sodium Palpitations  . Amiodarone Rash and Other (See Comments)    Possible rash and nervousness per patient  . Plaquenil [Hydroxychloroquine Sulfate] Rash    Family History  Problem Relation Age of Onset  . Coronary artery disease Mother   . Heart failure Mother   . Renal cancer Mother   . Kidney disease Mother   . Stroke Father        in his 40s  . Colon cancer Father   . Breast cancer Sister         X 2  . Rectal cancer Sister   . Breast cancer Sister   . Diabetes Neg Hx      Prior to Admission medications   Medication Sig Start Date End Date Taking? Authorizing Provider  acetaminophen (TYLENOL) 325 MG tablet Take 325-650 mg by mouth every 6 (six) hours as needed for mild pain or headache.   Yes [provider]  benazepril (LOTENSIN) 20 MG tablet TAKE 1 TABLET DAILY. TAKE AN EXTRA 20 MG  (1 TABLET) IF YOUR SYSTOLIC (TOP NUMBER) OF BLOOD PRESSURE IS 180 OR HIGHER Patient taking differently: Take 20 mg by mouth See admin instructions. Take 20 mg by mouth with supper and an extra 20 mg if Systolic (top number) of blood pressure is 180 or higher 05/12/18  Yes Bensimhon, Shaune Pascal, MD  beta carotene w/minerals (OCUVITE) tablet Take 1 tablet by mouth daily.    Yes [provider]  brimonidine (ALPHAGAN) 0.2 % ophthalmic solution Place 1 drop into the right eye 2 (two) times daily.     Yes [provider]  Calcium Carbonate-Vit D-Min (CALCIUM 1200 PO) Take 1,200 mg by mouth daily with breakfast.   Yes [provider]  carboxymethylcellulose (REFRESH  TEARS) 0.5 % SOLN Place 1 drop into both eyes 2 (two) times daily.    Yes [provider]  carvedilol (COREG) 12.5 MG tablet Take 6.25-12.5 mg by mouth See admin instructions. Take 6.25 mg by mouth in the morning and 12.5 mg at 6 PM   Yes [provider]  Cholecalciferol (VITAMIN D3) 3000 UNITS TABS Take 3,000 Units by mouth daily.    Yes [provider]  diphenhydrAMINE (BENADRYL) 25 MG tablet Take 25 mg by mouth at bedtime as needed for sleep (and sinus issues).    Yes [provider]  docusate sodium (COLACE) 100 MG capsule Take 100 mg by mouth daily as needed for mild constipation. Stool softener    Yes [provider]  furosemide (LASIX) 40 MG tablet Take 1 tablet (40 mg total) by mouth daily. Take an additional 20 mg (1/2 tab) if weight is 174 or more Patient taking differently: Take 40 mg by mouth See admin instructions. Take 40 mg by mouth once a day and an additional 20 mg if weight is 174 pounds or more 03/19/17  Yes Bensimhon, Shaune Pascal, MD  ibuprofen (ADVIL,MOTRIN) 200 MG tablet Take 200-400 mg by mouth every 8 (eight) hours as needed for headache or mild pain.    Yes [provider]  Misc Natural Products (OSTEO BI-FLEX ADV JOINT SHIELD) TABS Take 1 tablet by  mouth daily.    Yes [provider]  omeprazole (PRILOSEC) 20 MG capsule Take 20 mg by mouth daily.     Yes [provider]  Rivaroxaban (XARELTO) 15 MG TABS tablet Take 1 tablet (15 mg total) by mouth daily with supper. 04/01/18  Yes Evans Lance, MD  timolol (TIMOPTIC) 0.5 % ophthalmic solution Place 1 drop into the right eye 2 (two) times daily. 05/31/18  Yes [provider]  vitamin B-12 (CYANOCOBALAMIN) 1000 MCG tablet Take 1,000 mcg by mouth 2 (two) times daily.    Yes [provider]    Physical Exam: Vitals:   06/16/18 1823 06/16/18 1900 06/16/18 2130 06/16/18 2213  BP:  (!) 155/85 (!) 162/66 (!) 171/96  Pulse:  90 90 100  Resp:  16 11 18   Temp:    98 F (36.7 C)  TempSrc:    Oral  SpO2:  97% 95% 97%  Weight: 77.1 kg   77.1 kg  Height:    5\' 4"  (1.626 m)    Constitutional: NAD, calm  Eyes: PERTLA, lids and conjunctivae normal ENMT: Mucous membranes are moist. Posterior pharynx clear of any exudate or lesions.   Neck: normal, supple, no masses, no thyromegaly Respiratory: Mild dyspnea at rest, fine rales bilaterally, no wheezing. No pallor or diaphoresis.    Cardiovascular: Rate ~80 and irregular. Pitting edema to bilateral LE's. Abdomen: No distension, no tenderness, soft. Bowel sounds active.  Musculoskeletal: no clubbing / cyanosis. No joint deformity upper and lower extremities.    Skin: no significant rashes, lesions, ulcers. Warm, dry, well-perfused. Neurologic: No facial asymmetry. Sensation intact. Moving all extremities.  Psychiatric: Alert and oriented to person, place, and situation. Pleasant, cooperative.    Labs on Admission: I have personally reviewed following labs and imaging studies  CBC: Recent Labs  Lab 06/16/18 1608  WBC 4.3  HGB 10.8*  HCT 34.9*  MCV 97.8  PLT 61*   Basic Metabolic Panel: Recent Labs  Lab 06/16/18 1608  NA 142  K 3.1*  CL 107  CO2 24  GLUCOSE 106*  BUN 16  CREATININE 1.04*    CALCIUM 9.3   GFR: Estimated Creatinine Clearance: 34 mL/min (A) (by C-G formula based on SCr of 1.04 mg/dL (H)). Liver Function Tests: No results for input(s): AST, ALT, ALKPHOS, BILITOT, PROT, ALBUMIN in the last 168 hours. No results for input(s): LIPASE, AMYLASE in the last 168 hours. No results for input(s): AMMONIA in the last 168 hours. Coagulation Profile: No results for input(s): INR, PROTIME in the last 168 hours. Cardiac Enzymes: No results for input(s): CKTOTAL, CKMB, CKMBINDEX, TROPONINI in the last 168 hours. BNP (last 3 results) No results for input(s): PROBNP in the last 8760 hours. HbA1C: No results for input(s): HGBA1C in the last 72 hours. CBG: Recent Labs  Lab 06/16/18 1621  GLUCAP 91   Lipid Profile: No results for input(s): CHOL, HDL, LDLCALC, TRIG, CHOLHDL, LDLDIRECT in the last 72 hours. Thyroid Function Tests: No results for input(s): TSH, T4TOTAL, FREET4, T3FREE, THYROIDAB in the last 72 hours. Anemia Panel: No results for input(s): VITAMINB12, FOLATE, FERRITIN, TIBC, IRON, RETICCTPCT in the last 72 hours. Urine analysis:    Component Value Date/Time   COLORURINE YELLOW 09/23/2015 2210   APPEARANCEUR CLOUDY (A) 09/23/2015 2210   LABSPEC 1.017 09/23/2015 2210   PHURINE 6.5 09/23/2015 2210   GLUCOSEU NEGATIVE 09/23/2015 2210   GLUCOSEU NEGATIVE 01/19/2012 1526   HGBUR SMALL (A) 09/23/2015 2210   BILIRUBINUR NEGATIVE 09/23/2015 2210   KETONESUR 15 (A) 09/23/2015 2210   PROTEINUR NEGATIVE 09/23/2015 2210   UROBILINOGEN 0.2 01/19/2012 1526   NITRITE POSITIVE (A) 09/23/2015 2210   LEUKOCYTESUR MODERATE (A) 09/23/2015 2210   Sepsis Labs: @LABRCNTIP (XFGHWEXHBZJIR:6,VELFYBOFBPZ:0) )No results found for this or any previous visit (from the past 240 hour(s)).   Radiological Exams on Admission: Dg Chest 2 View  Result Date: 06/16/2018 CLINICAL DATA:  Four days of chest pain, shortness of breath and bilateral lower extremity swelling. EXAM: CHEST - 2  VIEW COMPARISON:  07/20/2017 FINDINGS: Cardiac pacemaker in stable position. Enlarged cardiac silhouette. Calcific atherosclerotic disease and tortuosity of the aorta. Mild interstitial pulmonary edema. Small bilateral pleural effusions. Stable appearance of T12 vertebroplasty. Stable posttraumatic changes in the right chest wall. Soft tissues are grossly normal. IMPRESSION: Enlarged cardiac silhouette with mild interstitial pulmonary edema and small bilateral pleural effusions. Electronically Signed   By: Fidela Salisbury M.D.   On: 06/16/2018 15:42    EKG: Independently reviewed. Atrial fibrillation.   Assessment/Plan  1. Acute on chronic diastolic CHF  - Presents with several days of progressive SOB, edema, and intermittent chest discomfort  - Found to be hypervolemic with hypertensive urgency and interstitial edema  - Cardiology is consulting and much appreciated  - Continue cardiac monitoring, correct electrolytes, diurese with IV Lasix, increase Coreg, continue ACE-i, follow I/O and daily wt, consider cardioversion   2. Chest pain; CAD  - Reports mild intermittent central chest discomfort for several days  - Hx of Takotsubo CM - Initial troponin is 0.00  - Continue cardiac monitoring, continue ACE-i and beta-blocker, repeat troponin measurements, follow-up echo, repeat EKG    3. Atrial fibrillation  - In rate-controlled a fib on admission - CHADS-VASc is 10 (age x2, gender, HTN, CHF, CAD)  - Continue Xarelto, beta-blocker    4. Thrombocytopenia  - Platelets appear to be stable at 61k without bleeding  - Previously followed by hematology, now released back to PCP for q63mo CBC    5. Hypokalemia  - Serum potassium is 3.1 on admission  - Treated with 40 mEq oral potassium in  ED, given supplemental mag empirically   - Continue cardiac monitoring, repeat chemistries    6. CKD III  - SCr is 1.04 on admission, lower than priors  - Renally-dose medications, follow daily chem panel  during diuresis   7. Hypertension with hypertensive urgency  - BP 170s/100 in ED - Anticipate improvement with diuresis  - Coreg increased per cardiology recommendation, ACE-i continued  8. Pernicious anemia   - Hgb is 10.8 on admission, down from 11.6 in October  - Denies bleeding; recent drop could be dilutional in setting of hypervolemia  - Continue B12, repeat CBC after diuresis    DVT prophylaxis: Xarelto  Code Status: DNR  Family Communication: Discussed with patient  Consults called: Cardiology Admission status: Home     Vianne Bulls, MD [facility] Pager [phone]  If 7PM-7AM, please contact night-coverage [email or web address] Password [email or web address]  06/16/2018, 11:04 PM

## 2018-06-16 NOTE — ED Triage Notes (Signed)
Patient has [employer] pacemaker.

## 2018-06-16 NOTE — ED Notes (Signed)
RN attempted to start IV. Pt requested IV team to start IV.

## 2018-06-16 NOTE — ED Triage Notes (Signed)
Patient to ED c/o shortness of breath, nausea, and intermittent central chest pains/discomfort with radiation to back x 3 days. Hx CHF and cardiomyopathy. Dyspnea on exertion. Resp e/u at rest, skin warm/dry.

## 2018-06-17 ENCOUNTER — Ambulatory Visit (HOSPITAL_BASED_OUTPATIENT_CLINIC_OR_DEPARTMENT_OTHER): Payer: Medicare Other

## 2018-06-17 DIAGNOSIS — M48061 Spinal stenosis, lumbar region without neurogenic claudication: Secondary | ICD-10-CM | POA: Diagnosis present

## 2018-06-17 DIAGNOSIS — Z800 Family history of malignant neoplasm of digestive organs: Secondary | ICD-10-CM | POA: Diagnosis not present

## 2018-06-17 DIAGNOSIS — Z90710 Acquired absence of both cervix and uterus: Secondary | ICD-10-CM | POA: Diagnosis not present

## 2018-06-17 DIAGNOSIS — I130 Hypertensive heart and chronic kidney disease with heart failure and stage 1 through stage 4 chronic kidney disease, or unspecified chronic kidney disease: Secondary | ICD-10-CM | POA: Diagnosis present

## 2018-06-17 DIAGNOSIS — I5033 Acute on chronic diastolic (congestive) heart failure: Secondary | ICD-10-CM | POA: Diagnosis not present

## 2018-06-17 DIAGNOSIS — H409 Unspecified glaucoma: Secondary | ICD-10-CM | POA: Diagnosis present

## 2018-06-17 DIAGNOSIS — I361 Nonrheumatic tricuspid (valve) insufficiency: Secondary | ICD-10-CM | POA: Diagnosis not present

## 2018-06-17 DIAGNOSIS — Z86010 Personal history of colonic polyps: Secondary | ICD-10-CM | POA: Diagnosis not present

## 2018-06-17 DIAGNOSIS — N179 Acute kidney failure, unspecified: Secondary | ICD-10-CM | POA: Diagnosis not present

## 2018-06-17 DIAGNOSIS — I252 Old myocardial infarction: Secondary | ICD-10-CM | POA: Diagnosis not present

## 2018-06-17 DIAGNOSIS — R0602 Shortness of breath: Secondary | ICD-10-CM | POA: Diagnosis not present

## 2018-06-17 DIAGNOSIS — Z947 Corneal transplant status: Secondary | ICD-10-CM | POA: Diagnosis not present

## 2018-06-17 DIAGNOSIS — R0789 Other chest pain: Secondary | ICD-10-CM | POA: Diagnosis not present

## 2018-06-17 DIAGNOSIS — N289 Disorder of kidney and ureter, unspecified: Secondary | ICD-10-CM | POA: Diagnosis not present

## 2018-06-17 DIAGNOSIS — I10 Essential (primary) hypertension: Secondary | ICD-10-CM | POA: Diagnosis not present

## 2018-06-17 DIAGNOSIS — I370 Nonrheumatic pulmonary valve stenosis: Secondary | ICD-10-CM | POA: Diagnosis not present

## 2018-06-17 DIAGNOSIS — I440 Atrioventricular block, first degree: Secondary | ICD-10-CM | POA: Diagnosis present

## 2018-06-17 DIAGNOSIS — I160 Hypertensive urgency: Secondary | ICD-10-CM | POA: Diagnosis not present

## 2018-06-17 DIAGNOSIS — E875 Hyperkalemia: Secondary | ICD-10-CM | POA: Diagnosis not present

## 2018-06-17 DIAGNOSIS — Z9841 Cataract extraction status, right eye: Secondary | ICD-10-CM | POA: Diagnosis not present

## 2018-06-17 DIAGNOSIS — I5043 Acute on chronic combined systolic (congestive) and diastolic (congestive) heart failure: Secondary | ICD-10-CM | POA: Diagnosis present

## 2018-06-17 DIAGNOSIS — I4819 Other persistent atrial fibrillation: Secondary | ICD-10-CM | POA: Diagnosis present

## 2018-06-17 DIAGNOSIS — Z7901 Long term (current) use of anticoagulants: Secondary | ICD-10-CM | POA: Diagnosis not present

## 2018-06-17 DIAGNOSIS — E785 Hyperlipidemia, unspecified: Secondary | ICD-10-CM | POA: Diagnosis present

## 2018-06-17 DIAGNOSIS — Z9842 Cataract extraction status, left eye: Secondary | ICD-10-CM | POA: Diagnosis not present

## 2018-06-17 DIAGNOSIS — I2510 Atherosclerotic heart disease of native coronary artery without angina pectoris: Secondary | ICD-10-CM | POA: Diagnosis present

## 2018-06-17 DIAGNOSIS — I5021 Acute systolic (congestive) heart failure: Secondary | ICD-10-CM | POA: Diagnosis not present

## 2018-06-17 DIAGNOSIS — Z8249 Family history of ischemic heart disease and other diseases of the circulatory system: Secondary | ICD-10-CM | POA: Diagnosis not present

## 2018-06-17 DIAGNOSIS — Z961 Presence of intraocular lens: Secondary | ICD-10-CM | POA: Diagnosis present

## 2018-06-17 DIAGNOSIS — Z803 Family history of malignant neoplasm of breast: Secondary | ICD-10-CM | POA: Diagnosis not present

## 2018-06-17 DIAGNOSIS — N189 Chronic kidney disease, unspecified: Secondary | ICD-10-CM | POA: Diagnosis not present

## 2018-06-17 DIAGNOSIS — I428 Other cardiomyopathies: Secondary | ICD-10-CM | POA: Diagnosis present

## 2018-06-17 DIAGNOSIS — D510 Vitamin B12 deficiency anemia due to intrinsic factor deficiency: Secondary | ICD-10-CM | POA: Diagnosis not present

## 2018-06-17 DIAGNOSIS — K559 Vascular disorder of intestine, unspecified: Secondary | ICD-10-CM | POA: Diagnosis present

## 2018-06-17 DIAGNOSIS — M810 Age-related osteoporosis without current pathological fracture: Secondary | ICD-10-CM | POA: Diagnosis present

## 2018-06-17 DIAGNOSIS — N183 Chronic kidney disease, stage 3 (moderate): Secondary | ICD-10-CM | POA: Diagnosis present

## 2018-06-17 LAB — BASIC METABOLIC PANEL
Anion gap: 12 (ref 5–15)
BUN: 14 mg/dL (ref 8–23)
CHLORIDE: 103 mmol/L (ref 98–111)
CO2: 28 mmol/L (ref 22–32)
Calcium: 8.7 mg/dL — ABNORMAL LOW (ref 8.9–10.3)
Creatinine, Ser: 1.12 mg/dL — ABNORMAL HIGH (ref 0.44–1.00)
GFR calc Af Amer: 49 mL/min — ABNORMAL LOW (ref >60)
GFR calc non Af Amer: 42 mL/min — ABNORMAL LOW (ref >60)
Glucose, Bld: 106 mg/dL — ABNORMAL HIGH (ref 70–99)
Potassium: 3 mmol/L — ABNORMAL LOW (ref 3.5–5.1)
Sodium: 143 mmol/L (ref 135–145)

## 2018-06-17 LAB — ECHOCARDIOGRAM COMPLETE
Height: 64 in
WEIGHTICAEL: 2683.2 [oz_av]

## 2018-06-17 LAB — MAGNESIUM: Magnesium: 1.9 mg/dL (ref 1.7–2.4)

## 2018-06-17 LAB — TROPONIN I

## 2018-06-17 MED ORDER — POTASSIUM CHLORIDE CRYS ER 20 MEQ PO TBCR
40.0000 meq | EXTENDED_RELEASE_TABLET | Freq: Two times a day (BID) | ORAL | Status: DC
  Administered 2018-06-17 – 2018-06-19 (×6): 40 meq via ORAL
  Filled 2018-06-17 (×7): qty 2

## 2018-06-17 MED ORDER — CARVEDILOL 25 MG PO TABS
25.0000 mg | ORAL_TABLET | Freq: Two times a day (BID) | ORAL | Status: DC
  Administered 2018-06-17 – 2018-06-24 (×15): 25 mg via ORAL
  Filled 2018-06-17 (×14): qty 1

## 2018-06-17 MED ORDER — HYDRALAZINE HCL 20 MG/ML IJ SOLN: 10.0000 mg | INTRAMUSCULAR | Status: DC | PRN

## 2018-06-17 MED ORDER — FUROSEMIDE 10 MG/ML IJ SOLN
40.0000 mg | Freq: Every day | INTRAMUSCULAR | Status: DC
  Administered 2018-06-18 – 2018-06-20 (×3): 40 mg via INTRAVENOUS
  Filled 2018-06-17 (×3): qty 4

## 2018-06-17 NOTE — Progress Notes (Signed)
Progress Note  Patient Name: [name] Date of Encounter: 06/17/2018  Primary Cardiologist: Glori Bickers, MD  Primary Electrophysiologist: Cristopher Peru, MD  Subjective   She reports still having some SOB this morning but somewhat improved from yesterday. Some mild chest discomfort this morning but is tender to touch. She reports improvement in LE swelling.   Inpatient Medications    Scheduled Meds: . benazepril  20 mg Oral Q supper  . brimonidine  1 drop Right Eye BID  . carvedilol  25 mg Oral BID WC  . furosemide  40 mg Intravenous Q12H  . pantoprazole  40 mg Oral Daily  . polyvinyl alcohol  1 drop Both Eyes BID  . potassium chloride  40 mEq Oral BID  . Rivaroxaban  15 mg Oral Q supper  . sodium chloride flush  3 mL Intravenous Q12H  . timolol  1 drop Right Eye BID  . vitamin B-12  1,000 mcg Oral BID   Continuous Infusions: . sodium chloride     PRN Meds: sodium chloride, acetaminophen, diphenhydrAMINE, hydrALAZINE, ondansetron (ZOFRAN) IV, sodium chloride flush   Vital Signs    Vitals:   06/16/18 2130 06/16/18 2213 06/17/18 0619 06/17/18 0851  BP: (!) 162/66 (!) 171/96 (!) 119/93 (!) 154/78  Pulse: 90 100 69 82  Resp: 11 18 18 18   Temp:  98 F (36.7 C) 97.9 F (36.6 C)   TempSrc:  Oral Oral   SpO2: 95% 97% 97% 97%  Weight:  77.1 kg 76.1 kg   Height:  5\' 4"  (1.626 m)      Intake/Output Summary (Last 24 hours) at 06/17/2018 1046 Last data filed at 06/17/2018 0941 Gross per 24 hour  Intake 528.21 ml  Output 5000 ml  Net -4471.79 ml   Filed Weights   06/16/18 1823 06/16/18 2213 06/17/18 0619  Weight: 77.1 kg 77.1 kg 76.1 kg    Telemetry    Atrial fibrillation with rates generally in the 80s but occasionally in the 100s. - Personally Reviewed  Physical Exam   GEN: Pleasant elderly female laying in bed in no acute distress.   Neck: +JVD, no carotid bruits Cardiac: IRIR, no murmurs, rubs, or gallops.  Respiratory: mild crackles at lung  bases (L>R) GI: NABS, Soft, nontender, non-distended  MS: 1-2+ LE edema; No deformity. Neuro:  Nonfocal, moving all extremities spontaneously Psych: Normal affect   Labs    Chemistry Recent Labs  Lab 06/16/18 1608 06/17/18 0457  NA 142 143  K 3.1* 3.0*  CL 107 103  CO2 24 28  GLUCOSE 106* 106*  BUN 16 14  CREATININE 1.04* 1.12*  CALCIUM 9.3 8.7*  GFRNONAA 46* 42*  GFRAA 54* 49*  ANIONGAP 11 12     Hematology Recent Labs  Lab 06/16/18 1608  WBC 4.3  RBC 3.57*  HGB 10.8*  HCT 34.9*  MCV 97.8  MCH 30.3  MCHC 30.9  RDW 13.3  PLT 61*    Cardiac Enzymes Recent Labs  Lab 06/17/18 0457  TROPONINI <0.03    Recent Labs  Lab 06/16/18 1615  TROPIPOC 0.00     BNP Recent Labs  Lab 06/16/18 1608  BNP 362.2*     DDimer No results for input(s): DDIMER in the last 168 hours.   Radiology    Dg Chest 2 View  Result Date: 06/16/2018 CLINICAL DATA:  Four days of chest pain, shortness of breath and bilateral lower extremity swelling. EXAM: CHEST - 2 VIEW COMPARISON:  07/20/2017 FINDINGS: Cardiac  pacemaker in stable position. Enlarged cardiac silhouette. Calcific atherosclerotic disease and tortuosity of the aorta. Mild interstitial pulmonary edema. Small bilateral pleural effusions. Stable appearance of T12 vertebroplasty. Stable posttraumatic changes in the right chest wall. Soft tissues are grossly normal. IMPRESSION: Enlarged cardiac silhouette with mild interstitial pulmonary edema and small bilateral pleural effusions. Electronically Signed   By: Fidela Salisbury M.D.   On: 06/16/2018 15:42    Cardiac Studies   Echocardiogram 2015: Study Conclusions  - Left ventricle: The cavity size was normal. Wall thickness was normal. Systolic function was normal. The estimated ejection fraction was in the range of 55% to 60%. Indeterminant diastolic function. Wall motion was normal; there were no regional wall motion abnormalities. - Aortic valve: There was  no stenosis. Trivial regurgitation. - Aorta: The ascending aorta was dilated to 4.1 cm. - Mitral valve: Mildly calcified annulus. Mildly calcified leaflets . No significant regurgitation. - Left atrium: The atrium was mildly dilated. - Right ventricle: The cavity size was normal. Systolic function was normal. - Tricuspid valve: Peak RV-RA gradient: 62mm Hg (S). - Pulmonary arteries: PA peak pressure: 62mm Hg (S). - Systemic veins: IVC measured 1.6 cm with < 50% respirophasic variation, suggesting RA pressure 8 mmHg. Impressions:  - Normal LV size with EF 55-60%. Normal RV size and systolic function. No significant valvular abnormalities. Mildly dilated ascending aorta to 4.1 cm.  Patient Profile     84 y.o. female with a PMH of diastolic CHF, CAD (75-91% stenosis of small caliber dOM2 on LHC in 2005), paroxysmal atrial fibrillation on xarelto, SSS s/p PPM, HTN, who presents with progressive DOE for which cardiology is following.   Assessment & Plan    1. Acute on chronic diastolic CHF: patient presents with SOB and LE edema. Found to have elevated BNP to 362 and pulmonary edema on CXR. She was started on IV lasix 40mg  daily (received 2 doses within 12 hours) with significant UOP of net -3.8L. Weight was 170lbs on admission, down to 167lbs today. Cr stable at 1.12 today (likely baseline) - Continue IV lasix 40mg  daily  - Continue to monitor strict I&Os and daily weights.  - Continue to monitor electrolytes closely and replete to maintain K>4, Mg>2 - Continue carvedilol and benazepril - Will follow-up echo report  2. HTN: poorly controlled. Home carvedilol was increased to 25mg  BID for better HR/BP control. She reports SBP 110s-150s at home.  - Continue carvedilol at increased dose - Continue home benzapril  3. Persistent atrial fibrillation: previously paroxysmal, however has been in persistent AF since 09/2017 based on last device interrogation 02/2018. Has not tolerated  amiodarone in the past. Rates have been generally stable, although carvedilol was increased to 25mg  BID overnight for improved HR and BP control. Possible this is contributing to #1.  - Continue carvedilol at increased dose - Continue xarelto for stroke ppx for CHA2DS2-VASc Score of 6 (CHF, HTN, Vascular, Female, and Age >75) - Could consider DCCV pending echo results.   4. CAD: noted to have 70-80% stenosis of small caliber dOM2 on LHC in 2005. She reports some intermittent atypical chest discomfort. Trops are negative. EKG with atrial fibrillation, non-ischemic. Not on ASA given anticoagulation needs  - Will follow-up echocardiogram but do not anticipate further cardiac work-up.   5. History of Takotsubo cardiomyopathy: EF 25% in 2005 which improved to normal on last echo in 2015.    For questions or updates, please contact [place] Please consult [email or web address] for contact info under Cardiology/STEMI.  Signed, Abigail Butts, PA-C  06/17/2018, 10:46 AM   [phone]

## 2018-06-17 NOTE — Progress Notes (Signed)
PROGRESS NOTE    [name]  OIB:[id number] DOB: 11/13/1922 DOA: 06/16/2018 PCP: Colon Branch, MD    Brief Narrative:  84 y.o. female with medical history significant for coronary artery disease, atrial fibrillation on Xarelto, hypertension, chronic diastolic CHF, chronic kidney disease stage III, ITP, pernicious anemia, and sick sinus syndrome with pacer, now presenting to the emergency department for evaluation of progressive shortness of breath, nausea, swelling, and mild intermittent chest discomfort over the past several days.  She reports a mild cough, denies fevers or chills, reports her dyspnea is worse with exertion, and describes her chest discomfort as mild, intermittent, and without any definite alleviating or exacerbating factors identified.  She reports consistent adherence with her antihypertensives, diuretic, and Xarelto.  Denies any melena or hematochezia.  ED Course: Upon arrival to the ED, patient is found to be afebrile, saturating well on room air, slightly tachypneic, and hypertensive to 170s over low 100s.  EKG features atrial fibrillation and chest x-ray is notable for cardiomegaly with mild interstitial edema and small bilateral pleural effusions.  Chemistry panel is notable for potassium 3.1 and creatinine 1.04, lower than priors.  CBC features a hemoglobin of 10.8 and platelets of 61,000.  Troponin is 0.00 and BNP is elevated to 362.  Cardiology was consulted by the ED physician and recommended medical admission with cardiology to consult.  Patient was treated with 324 mg of aspirin, 40 mg IV Lasix, and 40 mEq oral potassium in the ED.  She remains hypertensive, dyspneic but not in acute distress, and will be observed on the telemetry unit for ongoing evaluation and management.  Assessment & Plan:   Principal Problem:   Acute on chronic diastolic CHF (congestive heart failure) (HCC) Active Problems:   *ANEMIA, PERNICIOUS   Thrombocytopenia (HCC)   *Essential  hypertension   Chest pain   *CKD (chronic kidney disease) stage 3, GFR 30-59 ml/min   Coronary artery disease   Hypokalemia   Hypertensive urgency  1. Acute on chronic diastolic CHF  - Presents with several days of progressive SOB, edema, and intermittent chest discomfort  - Found to be hypervolemic with hypertensive urgency and interstitial edema  - Cardiology following - Continuing IV lasix - Consideration for ?cardioversion  2. Chest pain; CAD  - Reports mild intermittent central chest discomfort for several days  - Hx of Takotsubo CM - Initial troponin is 0.00  - Pain free this AM - F/u 2d echo result  3. Atrial fibrillation  - In rate-controlled a fib on admission - CHADS-VASc is 10 (age x2, gender, HTN, CHF, CAD)  - Continue Xarelto, beta-blocker   - consideration for ?cardioversion per Cardiology  4. Thrombocytopenia  - Platelets appear to be stable at 61k without bleeding at time of admission - Previously followed by hematology, now released back to PCP for q76mo CBC   - Follow blood count  5. Hypokalemia  - Serum potassium is 3.1 on admission  - Treated with 40 mEq oral potassium in ED, given supplemental mag empirically   - K of 3.0, replaced. Repeat bmet in AM  6. CKD III  - SCr is 1.04 on admission, lower than priors  - Renally-dose medications, - Repeat bmet in AM  7. Hypertension with hypertensive urgency  - BP 170s/100 in ED - Anticipate improvement with diuresis  - Coreg increased per cardiology recommendation, ACE-i continued - Stable at present  8. Pernicious anemia   - Hgb is 10.8 on admission, down from 11.6 in October  -  Denies bleeding; recent drop could be dilutional in setting of hypervolemia  - Continue B12 - Follow CBC - hemodynamically stable this AM  DVT prophylaxis: Xarelto Code Status: DNR Family Communication: Pt in room, family not at bedside Disposition Plan: Uncertain at this time  Consultants:    Cardiology  Procedures:     Antimicrobials: Anti-infectives (From admission, onward)   None       Subjective: Still with LE swelling, albeit somewhat improved  Objective: Vitals:   06/16/18 2213 06/17/18 0619 06/17/18 0851 06/17/18 1303  BP: (!) 171/96 (!) 119/93 (!) 154/78 139/81  Pulse: 100 69 82 89  Resp: 18 18 18 20   Temp: 98 F (36.7 C) 97.9 F (36.6 C)  98.2 F (36.8 C)  TempSrc: Oral Oral  Oral  SpO2: 97% 97% 97% 100%  Weight: 77.1 kg 76.1 kg    Height: 5\' 4"  (1.626 m)       Intake/Output Summary (Last 24 hours) at 06/17/2018 1425 Last data filed at 06/17/2018 1301 Gross per 24 hour  Intake 528.21 ml  Output 5750 ml  Net -5221.79 ml   Filed Weights   06/16/18 1823 06/16/18 2213 06/17/18 0619  Weight: 77.1 kg 77.1 kg 76.1 kg    Examination:  General exam: Appears calm and comfortable  Respiratory system: Clear to auscultation. Respiratory effort normal. Cardiovascular system: S1 & S2 heard, RRR Gastrointestinal system: Abdomen is nondistended, soft and nontender. No organomegaly or masses felt. Normal bowel sounds heard. Central nervous system: Alert and oriented. No focal neurological deficits. Extremities: Symmetric 5 x 5 power, BLE edema Skin: No rashes, lesions Psychiatry: Judgement and insight appear normal. Mood & affect appropriate.   Data Reviewed: I have personally reviewed following labs and imaging studies  CBC: Recent Labs  Lab 06/16/18 1608  WBC 4.3  HGB 10.8*  HCT 34.9*  MCV 97.8  PLT 61*   Basic Metabolic Panel: Recent Labs  Lab 06/16/18 1608 06/17/18 0457  NA 142 143  K 3.1* 3.0*  CL 107 103  CO2 24 28  GLUCOSE 106* 106*  BUN 16 14  CREATININE 1.04* 1.12*  CALCIUM 9.3 8.7*  MG  --  1.9   GFR: Estimated Creatinine Clearance: 31.4 mL/min (A) (by C-G formula based on SCr of 1.12 mg/dL (H)). Liver Function Tests: No results for input(s): AST, ALT, ALKPHOS, BILITOT, PROT, ALBUMIN in the last 168 hours. No results  for input(s): LIPASE, AMYLASE in the last 168 hours. No results for input(s): AMMONIA in the last 168 hours. Coagulation Profile: No results for input(s): INR, PROTIME in the last 168 hours. Cardiac Enzymes: Recent Labs  Lab 06/17/18 0457  TROPONINI <0.03   BNP (last 3 results) No results for input(s): PROBNP in the last 8760 hours. HbA1C: No results for input(s): HGBA1C in the last 72 hours. CBG: Recent Labs  Lab 06/16/18 1621  GLUCAP 91   Lipid Profile: No results for input(s): CHOL, HDL, LDLCALC, TRIG, CHOLHDL, LDLDIRECT in the last 72 hours. Thyroid Function Tests: No results for input(s): TSH, T4TOTAL, FREET4, T3FREE, THYROIDAB in the last 72 hours. Anemia Panel: No results for input(s): VITAMINB12, FOLATE, FERRITIN, TIBC, IRON, RETICCTPCT in the last 72 hours. Sepsis Labs: No results for input(s): PROCALCITON, LATICACIDVEN in the last 168 hours.  No results found for this or any previous visit (from the past 240 hour(s)).   Radiology Studies: Dg Chest 2 View  Result Date: 06/16/2018 CLINICAL DATA:  Four days of chest pain, shortness of breath and bilateral lower  extremity swelling. EXAM: CHEST - 2 VIEW COMPARISON:  07/20/2017 FINDINGS: Cardiac pacemaker in stable position. Enlarged cardiac silhouette. Calcific atherosclerotic disease and tortuosity of the aorta. Mild interstitial pulmonary edema. Small bilateral pleural effusions. Stable appearance of T12 vertebroplasty. Stable posttraumatic changes in the right chest wall. Soft tissues are grossly normal. IMPRESSION: Enlarged cardiac silhouette with mild interstitial pulmonary edema and small bilateral pleural effusions. Electronically Signed   By: Fidela Salisbury M.D.   On: 06/16/2018 15:42    Scheduled Meds: . benazepril  20 mg Oral Q supper  . brimonidine  1 drop Right Eye BID  . carvedilol  25 mg Oral BID WC  . [START ON 06/18/2018] furosemide  40 mg Intravenous Daily  . pantoprazole  40 mg Oral Daily  .  polyvinyl alcohol  1 drop Both Eyes BID  . potassium chloride  40 mEq Oral BID  . Rivaroxaban  15 mg Oral Q supper  . sodium chloride flush  3 mL Intravenous Q12H  . timolol  1 drop Right Eye BID  . vitamin B-12  1,000 mcg Oral BID   Continuous Infusions: . sodium chloride       LOS: 0 days   Marylu Lund, MD [facility] Pager On Amion  If 7PM-7AM, please contact night-coverage 06/17/2018, 2:25 PM

## 2018-06-17 NOTE — Progress Notes (Addendum)
UR Reviewer Rod Holler RN called to review the case for Inpatient Status. Mindi Slicker [facility] [phone]

## 2018-06-17 NOTE — Plan of Care (Signed)

## 2018-06-17 NOTE — Progress Notes (Signed)
  Echocardiogram 2D Echocardiogram has been performed.  [name] 06/17/2018, 12:35 PM

## 2018-06-17 NOTE — Evaluation (Signed)
Physical Therapy Evaluation Patient Details Name: [name] MRN: [id number] DOB: December 11, 1922 Today's Date: 06/17/2018   History of Present Illness  Pt is a 84 y/o female admitted secondary to worsening SOB and LE edema. PMH includes CKD, CAD, HTN, a fib, Takotsubo cardiomyopathy, and s/p pacemaker.   Clinical Impression  Pt admitted secondary to problem above with deficits below. Pt with slow, guarded ambulation and mild unsteadiness. Pt wanting to stay in the room this session. Required min guard A for safety with use of cane. Pt lives alone, however, reports her son and neighbors check in frequently. Will continue to follow acutely to maximize functional mobility independence and safety.     Follow Up Recommendations Home health PT;Supervision for mobility/OOB    Equipment Recommendations  None recommended by PT    Recommendations for Other Services       Precautions / Restrictions Precautions Precautions: Fall Restrictions Weight Bearing Restrictions: No      Mobility  Bed Mobility Overal bed mobility: Modified Independent                Transfers Overall transfer level: Needs assistance Equipment used: Straight cane Transfers: Sit to/from Stand Sit to Stand: Min guard         General transfer comment: Min guard for safety.   Ambulation/Gait Ambulation/Gait assistance: Min guard Gait Distance (Feet): 25 Feet Assistive device: Straight cane Gait Pattern/deviations: Step-through pattern;Decreased stride length Gait velocity: Decreased    General Gait Details: Slow, cautious gait. Mild unsteadiness noted, however, no overt LOB noted. Pt reports she is walking slower than normal. Pt wanting to stay within the room for ambulation this session.   Stairs            Wheelchair Mobility    Modified Rankin (Stroke Patients Only)       Balance Overall balance assessment: Needs assistance Sitting-balance support: No upper extremity supported;Feet  supported Sitting balance-Leahy Scale: Fair     Standing balance support: Single extremity supported;During functional activity Standing balance-Leahy Scale: Poor Standing balance comment: Reliant on at least 1 UE support                              Pertinent Vitals/Pain Pain Assessment: Faces Faces Pain Scale: Hurts a little bit Pain Location: BLE  Pain Descriptors / Indicators: Sore Pain Intervention(s): Limited activity within patient's tolerance;Monitored during session;Repositioned    Home Living Family/patient expects to be discharged to:: Private residence Living Arrangements: Alone Available Help at Discharge: Family;Neighbor;Available PRN/intermittently Type of Home: House Home Access: Level entry     Home Layout: Two level;Able to live on main level with bedroom/bathroom Home Equipment: Kasandra Knudsen - single point;Shower seat;Grab bars - tub/shower;Grab bars - toilet      Prior Function Level of Independence: Independent with assistive device(s)         Comments: Uses cane for ambulation. Reports she still drives.      Hand Dominance        Extremity/Trunk Assessment   Upper Extremity Assessment Upper Extremity Assessment: Defer to OT evaluation    Lower Extremity Assessment Lower Extremity Assessment: Generalized weakness    Cervical / Trunk Assessment Cervical / Trunk Assessment: Normal  Communication   Communication: No difficulties  Cognition Arousal/Alertness: Awake/alert Behavior During Therapy: WFL for tasks assessed/performed Overall Cognitive Status: Within Functional Limits for tasks assessed  General Comments      Exercises     Assessment/Plan    PT Assessment Patient needs continued PT services  PT Problem List Decreased strength;Decreased balance;Decreased mobility;Decreased knowledge of precautions;Pain       PT Treatment Interventions DME instruction;Gait  training;Functional mobility training;Therapeutic activities;Therapeutic exercise;Balance training;Patient/family education    PT Goals (Current goals can be found in the Care Plan section)  Acute Rehab PT Goals Patient Stated Goal: to go home  PT Goal Formulation: With patient Time For Goal Achievement: 07/01/18 Potential to Achieve Goals: Good    Frequency Min 3X/week   Barriers to discharge Decreased caregiver support      Co-evaluation               AM-PAC PT "6 Clicks" Mobility  Outcome Measure Help needed turning from your back to your side while in a flat bed without using bedrails?: None Help needed moving from lying on your back to sitting on the side of a flat bed without using bedrails?: None Help needed moving to and from a bed to a chair (including a wheelchair)?: A Little Help needed standing up from a chair using your arms (e.g., wheelchair or bedside chair)?: A Little Help needed to walk in hospital room?: A Little Help needed climbing 3-5 steps with a railing? : A Lot 6 Click Score: 19    End of Session Equipment Utilized During Treatment: Gait belt Activity Tolerance: Patient tolerated treatment well Patient left: in chair;with call bell/phone within reach;with chair alarm set Nurse Communication: Mobility status PT Visit Diagnosis: Unsteadiness on feet (R26.81);Other abnormalities of gait and mobility (R26.89);Muscle weakness (generalized) (M62.81)    Time: 2229-7989 PT Time Calculation (min) (ACUTE ONLY): 23 min   Charges:   PT Evaluation $PT Eval Low Complexity: 1 Low PT Treatments $Therapeutic Activity: 8-22 mins        Leighton Ruff, PT, DPT  Acute Rehabilitation Services  Pager: [phone] Office: [phone]   Rudean Hitt 06/17/2018, 2:52 PM

## 2018-06-18 DIAGNOSIS — I5033 Acute on chronic diastolic (congestive) heart failure: Secondary | ICD-10-CM

## 2018-06-18 LAB — BASIC METABOLIC PANEL
Anion gap: 8 (ref 5–15)
BUN: 15 mg/dL (ref 8–23)
CALCIUM: 8.6 mg/dL — AB (ref 8.9–10.3)
CO2: 32 mmol/L (ref 22–32)
CREATININE: 1.4 mg/dL — AB (ref 0.44–1.00)
Chloride: 102 mmol/L (ref 98–111)
GFR calc Af Amer: 37 mL/min — ABNORMAL LOW (ref >60)
GFR, EST NON AFRICAN AMERICAN: 32 mL/min — AB (ref >60)
Glucose, Bld: 117 mg/dL — ABNORMAL HIGH (ref 70–99)
Potassium: 3.8 mmol/L (ref 3.5–5.1)
Sodium: 142 mmol/L (ref 135–145)

## 2018-06-18 MED ORDER — TRAZODONE HCL 100 MG PO TABS
100.0000 mg | ORAL_TABLET | Freq: Every evening | ORAL | Status: DC | PRN
  Filled 2018-06-18 (×2): qty 1

## 2018-06-18 NOTE — Evaluation (Signed)
Occupational Therapy Evaluation and Discharge Patient Details Name: [name] MRN: [id number] DOB: 02-22-1923 Today's Date: 06/18/2018    History of Present Illness Pt is a 84 y/o female admitted secondary to worsening SOB and LE edema. PMH includes CKD, CAD, HTN, a fib, Takotsubo cardiomyopathy, and s/p pacemaker.    Clinical Impression   This 84 yo female admitted with above presents to acute OT at a mod I from [facility] level, no further OT needs we will sign off.    Follow Up Recommendations  No OT follow up;Supervision - Intermittent    Equipment Recommendations  None recommended by OT       Precautions / Restrictions Precautions Precautions: Fall Restrictions Weight Bearing Restrictions: No      Mobility Bed Mobility Overal bed mobility: Modified Independent                Transfers Overall transfer level: Modified independent Equipment used: Straight cane             General transfer comment: Ambulated in hallway 200 feet with SPC         ADL either performed or assessed with clinical judgement   ADL Overall ADL's : Modified independent                                             Vision Patient Visual Report: No change from baseline              Pertinent Vitals/Pain Pain Assessment: No/denies pain     Hand Dominance Right   Extremity/Trunk Assessment Upper Extremity Assessment Upper Extremity Assessment: Overall WFL for tasks assessed           Communication Communication Communication: No difficulties   Cognition Arousal/Alertness: Awake/alert Behavior During Therapy: WFL for tasks assessed/performed Overall Cognitive Status: Within Functional Limits for tasks assessed                                                Home Living Family/patient expects to be discharged to:: Private residence Living Arrangements: Alone Available Help at Discharge: Family;Neighbor;Available  PRN/intermittently Type of Home: House Home Access: Level entry     Home Layout: Two level;Able to live on main level with bedroom/bathroom     Bathroom Shower/Tub: Walk-in shower;Door   [employer] Toilet: Standard     Home Equipment: Kasandra Knudsen - single point;Shower seat;Grab bars - tub/shower;Grab bars - toilet          Prior Functioning/Environment Level of Independence: Independent with assistive device(s)        Comments: Uses cane for ambulation. Reports she still drives. Does her own basic ADLs and some IADLs                 OT Goals(Current goals can be found in the care plan section) Acute Rehab OT Goals Patient Stated Goal: to go home  OT Goal Formulation: With patient  OT Frequency:                AM-PAC OT "6 Clicks" Daily Activity     Outcome Measure Help from another person eating meals?: None Help from another person taking care of personal grooming?: None Help from another person toileting, which includes using toliet, bedpan, or  urinal?: None Help from another person bathing (including washing, rinsing, drying)?: None Help from another person to put on and taking off regular upper body clothing?: None Help from another person to put on and taking off regular lower body clothing?: None 6 Click Score: 24   End of Session Equipment Utilized During Treatment: [facility]) Nurse Communication: (called front desk to let them know purewick needed to be replaced)  Activity Tolerance: Patient tolerated treatment well Patient left: in bed;with call bell/phone within reach;with bed alarm set  OT Visit Diagnosis: Muscle weakness (generalized) (M62.81)                Time: 0459-9774 OT Time Calculation (min): 17 min Charges:  OT General Charges $OT Visit: 1 Visit OT Evaluation $OT Eval Moderate Complexity: 1 Mod  [address], OTR/L Acute [employer] Pager [phone] Office [phone]     Almon Register 06/18/2018, 5:05 PM

## 2018-06-18 NOTE — Progress Notes (Signed)
Progress Note  Patient Name: [name] Date of Encounter: 06/18/2018  Primary Cardiologist: Glori Bickers, MD  Primary Electrophysiologist: Cristopher Peru, MD  Subjective   Has noted good urine output, leg swelling better but still worse than prior. Slept fair, felt anxious and had trouble going back to sleep. Has sharp chest pain with deep breathing midsternal, tender to palpation. Otherwise no concerns today.  Inpatient Medications    Scheduled Meds: . benazepril  20 mg Oral Q supper  . brimonidine  1 drop Right Eye BID  . carvedilol  25 mg Oral BID WC  . furosemide  40 mg Intravenous Daily  . pantoprazole  40 mg Oral Daily  . polyvinyl alcohol  1 drop Both Eyes BID  . potassium chloride  40 mEq Oral BID  . Rivaroxaban  15 mg Oral Q supper  . sodium chloride flush  3 mL Intravenous Q12H  . timolol  1 drop Right Eye BID  . vitamin B-12  1,000 mcg Oral BID   Continuous Infusions: . sodium chloride     PRN Meds: sodium chloride, acetaminophen, diphenhydrAMINE, hydrALAZINE, ondansetron (ZOFRAN) IV, sodium chloride flush   Vital Signs    Vitals:   06/17/18 1723 06/17/18 1959 06/18/18 0021 06/18/18 0548  BP: (!) 161/80 116/68 114/63 (!) 134/91  Pulse: 75 79 86 70  Resp: 18 18 18 20   Temp: 98.4 F (36.9 C) 98.6 F (37 C) 98.4 F (36.9 C) 98.4 F (36.9 C)  TempSrc: Oral Oral Oral Oral  SpO2: 99% 94% 94% 96%  Weight:    74.9 kg  Height:        Intake/Output Summary (Last 24 hours) at 06/18/2018 0815 Last data filed at 06/18/2018 0030 Gross per 24 hour  Intake 600 ml  Output 2200 ml  Net -1600 ml   Filed Weights   06/16/18 2213 06/17/18 0619 06/18/18 0548  Weight: 77.1 kg 76.1 kg 74.9 kg    Telemetry    Atrial fibrillation, HR in the 70s predominantly - Personally Reviewed  Physical Exam   GEN: Pleasant elderly female sitting up in chair, in NAD Neck: no carotid bruits, elevated JVD Cardiac: irregularly irregular, no murmurs, rubs, or gallops.    Respiratory: good in upper fields, rales in bilateral bases GI: soft, NTND MS: 1+ LE edema bilaterally; No deformity. Neuro:  Nonfocal, moving all extremities spontaneously Psych: Normal affect   Labs    Chemistry Recent Labs  Lab 06/16/18 1608 06/17/18 0457 06/18/18 0538  NA 142 143 142  K 3.1* 3.0* 3.8  CL 107 103 102  CO2 24 28 32  GLUCOSE 106* 106* 117*  BUN 16 14 15   CREATININE 1.04* 1.12* 1.40*  CALCIUM 9.3 8.7* 8.6*  GFRNONAA 46* 42* 32*  GFRAA 54* 49* 37*  ANIONGAP 11 12 8      Hematology Recent Labs  Lab 06/16/18 1608  WBC 4.3  RBC 3.57*  HGB 10.8*  HCT 34.9*  MCV 97.8  MCH 30.3  MCHC 30.9  RDW 13.3  PLT 61*    Cardiac Enzymes Recent Labs  Lab 06/17/18 0457  TROPONINI <0.03    Recent Labs  Lab 06/16/18 1615  TROPIPOC 0.00     BNP Recent Labs  Lab 06/16/18 1608  BNP 362.2*     DDimer No results for input(s): DDIMER in the last 168 hours.   Radiology    Dg Chest 2 View  Result Date: 06/16/2018 CLINICAL DATA:  Four days of chest pain, shortness of breath  and bilateral lower extremity swelling. EXAM: CHEST - 2 VIEW COMPARISON:  07/20/2017 FINDINGS: Cardiac pacemaker in stable position. Enlarged cardiac silhouette. Calcific atherosclerotic disease and tortuosity of the aorta. Mild interstitial pulmonary edema. Small bilateral pleural effusions. Stable appearance of T12 vertebroplasty. Stable posttraumatic changes in the right chest wall. Soft tissues are grossly normal. IMPRESSION: Enlarged cardiac silhouette with mild interstitial pulmonary edema and small bilateral pleural effusions. Electronically Signed   By: Fidela Salisbury M.D.   On: 06/16/2018 15:42    Cardiac Studies   Echocardiogram 06/17/18: Study Conclusions - Left ventricle: Systolic function was normal. The estimated   ejection fraction was in the range of 55% to 60%. The study is   not technically sufficient to allow evaluation of LV diastolic   function. - Aortic  valve: Sclerosis without stenosis. There was trivial   regurgitation. - Mitral valve: Calcified annulus. Mildly thickened leaflets .   There was mild regurgitation. - Left atrium: The atrium was mildly dilated. - Right ventricle: Catheter in RV The cavity size was mildly   dilated. - Right atrium: The atrium was mildly dilated. - Tricuspid valve: There was moderate regurgitation. - Pulmonary arteries: PA peak pressure: 60 mm Hg (S).  Patient Profile     84 y.o. female with a PMH of diastolic CHF, CAD (99-35% stenosis of small caliber dOM2 on LHC in 2005), paroxysmal atrial fibrillation on xarelto, SSS s/p PPM, HTN, who presents with progressive DOE for which cardiology is following.   Assessment & Plan    1. Acute on chronic diastolic CHF: patient presents with SOB and LE edema. Found to have elevated BNP to 362 and pulmonary edema on CXR.  - Net negative 5.4 L, weight from 77.1 to 74.9 kg - Cr up to 1.4 today (1.1 baseline) - Continue IV lasix 40mg  daily for today, we may need to hold tomorrow if Cr continues to rise - Continue to monitor strict I&Os and daily weights. Fluid and sodium restriction. - Continue to monitor electrolytes closely and replete to maintain K>4, Mg>2 - Continue carvedilol and benazepril - echo with preserved EF and elevated right sided pressures  2. HTN: not at goal control at home, improving here with medication adjustments.  - Continue carvedilol at increased dose - Continue home benzapril  3. Persistent atrial fibrillation: previously paroxysmal, however has been in persistent AF since 09/2017 based on last device interrogation 02/2018. Has not tolerated amiodarone in the past. - Continue carvedilol at increased dose. Rates well controlled in the 27s. - Continue xarelto for stroke ppx for CHA2DS2-VASc Score of 6 (CHF, HTN, Vascular, Female, and Age >75) - unlikely to remain in sinus rhythm without antiarrhythmic drug load, and she has not tolerated  amiodarone in the past. Therefore, will plan for rate control and not rhythm control.   4. CAD: noted to have 70-80% stenosis of small caliber dOM2 on LHC in 2005. She reports some intermittent atypical chest discomfort. Trops are negative. EKG with atrial fibrillation, non-ischemic. Not on ASA given anticoagulation needs  - no further cardiac work-up.  - she is tender to palpation over the area, likely MSK. Recommend warm compress PRN.  TIME SPENT WITH PATIENT: 25 minutes of direct patient care. More than 50% of that time was spent on coordination of care and counseling regarding heart failure, medications, diuresis, kidney function, chest pain.  For questions or updates, please contact [place] Please consult [email or web address] for contact info under Cardiology/STEMI.    Signed, Buford Dresser, MD  06/18/2018,  8:15 AM   [phone]

## 2018-06-18 NOTE — Plan of Care (Signed)
  Problem: Education: Goal: Knowledge of General Education information will improve Description Including pain rating scale, medication(s)/side effects and non-pharmacologic comfort measures Outcome: Progressing   Problem: Health Behavior/Discharge Planning: Goal: Ability to manage health-related needs will improve Outcome: Progressing   

## 2018-06-18 NOTE — Progress Notes (Signed)
OT Cancellation Note  Patient Details Name: LILLYANNA GLANDON MRN: [id number] DOB: 1922-07-26   Cancelled Treatment:    Reason Eval/Treat Not Completed: Other (comment). Pt just got cleaned up and purewick put back in place--pt requests that I come back later.   [place], OTR/L Acute Rehab Services Pager [phone] Office [phone]     Almon Register 06/18/2018, 12:29 PM

## 2018-06-18 NOTE — Progress Notes (Signed)
PROGRESS NOTE    [name]  ZOX:[id number] DOB: 1923/02/12 DOA: 06/16/2018 PCP: Colon Branch, MD    Brief Narrative:  84 y.o. female with medical history significant for coronary artery disease, atrial fibrillation on Xarelto, hypertension, chronic diastolic CHF, chronic kidney disease stage III, ITP, pernicious anemia, and sick sinus syndrome with pacer, now presenting to the emergency department for evaluation of progressive shortness of breath, nausea, swelling, and mild intermittent chest discomfort over the past several days.  She reports a mild cough, denies fevers or chills, reports her dyspnea is worse with exertion, and describes her chest discomfort as mild, intermittent, and without any definite alleviating or exacerbating factors identified.  She reports consistent adherence with her antihypertensives, diuretic, and Xarelto.  Denies any melena or hematochezia.  ED Course: Upon arrival to the ED, patient is found to be afebrile, saturating well on room air, slightly tachypneic, and hypertensive to 170s over low 100s.  EKG features atrial fibrillation and chest x-ray is notable for cardiomegaly with mild interstitial edema and small bilateral pleural effusions.  Chemistry panel is notable for potassium 3.1 and creatinine 1.04, lower than priors.  CBC features a hemoglobin of 10.8 and platelets of 61,000.  Troponin is 0.00 and BNP is elevated to 362.  Cardiology was consulted by the ED physician and recommended medical admission with cardiology to consult.  Patient was treated with 324 mg of aspirin, 40 mg IV Lasix, and 40 mEq oral potassium in the ED.  She remains hypertensive, dyspneic but not in acute distress, and will be observed on the telemetry unit for ongoing evaluation and management.  Assessment & Plan:   Principal Problem:   Acute on chronic diastolic CHF (congestive heart failure) (HCC) Active Problems:   *ANEMIA, PERNICIOUS   Thrombocytopenia (HCC)   *Essential  hypertension   Chest pain   *CKD (chronic kidney disease) stage 3, GFR 30-59 ml/min   Coronary artery disease   Hypokalemia   Hypertensive urgency  1. Acute on chronic diastolic CHF  - Presents with several days of progressive SOB, edema, and intermittent chest discomfort  - Found to be hypervolemic with hypertensive urgency and interstitial edema  - Cardiology following - Continuing IV lasix per Cardiology, possible holding IV lasix tomorrow as Cr is rising  2. Chest pain; CAD  - Reports mild intermittent central chest discomfort for several days  - Hx of Takotsubo CM - Initial troponin is 0.00  - remains chest pain free  3. Atrial fibrillation  - In rate-controlled a fib on admission - CHADS-VASc is 44 (age x2, gender, HTN, CHF, CAD)  - Continue Xarelto, beta-blocker   - Cardiology planning for rate control and not rhythm control  4. Thrombocytopenia  - Platelets appear to be stable at 61k without bleeding at time of admission - Previously followed by hematology, now released back to PCP for q14mo CBC   - No evidence of bleeding  5. Hypokalemia  - Serum potassium is 3.1 on admission  - Normalized -Repeat bmet in AM  6. CKD III  - SCr is 1.04 on admission, lower than priors  - Renally-dose medications, - Recheck bmet in AM  7. Hypertension with hypertensive urgency  - BP 170s/100 in ED - Anticipate improvement with diuresis  - Coreg increased per cardiology recommendation, ACE-i continued - Currently stable  8. Pernicious anemia   - Hgb is 10.8 on admission, down from 11.6 in October  - Denies bleeding; recent drop could be dilutional in setting of  hypervolemia  - Continue B12 - Follow CBC - Remains hemodynamically stable  DVT prophylaxis: Xarelto Code Status: DNR Family Communication: Pt in room, family not at bedside Disposition Plan: Uncertain at this time  Consultants:   Cardiology  Procedures:     Antimicrobials: Anti-infectives (From  admission, onward)   None      Subjective: No complaints this am. No chest pain  Objective: Vitals:   06/17/18 1723 06/17/18 1959 06/18/18 0021 06/18/18 0548  BP: (!) 161/80 116/68 114/63 (!) 134/91  Pulse: 75 79 86 70  Resp: 18 18 18 20   Temp: 98.4 F (36.9 C) 98.6 F (37 C) 98.4 F (36.9 C) 98.4 F (36.9 C)  TempSrc: Oral Oral Oral Oral  SpO2: 99% 94% 94% 96%  Weight:    74.9 kg  Height:        Intake/Output Summary (Last 24 hours) at 06/18/2018 1459 Last data filed at 06/18/2018 9357 Gross per 24 hour  Intake 360 ml  Output 450 ml  Net -90 ml   Filed Weights   06/16/18 2213 06/17/18 0619 06/18/18 0548  Weight: 77.1 kg 76.1 kg 74.9 kg    Examination: General exam: Conversant, in no acute distress Respiratory system: normal chest rise, clear, no audible wheezing Cardiovascular system: regular rhythm, s1-s2 Gastrointestinal system: Nondistended, nontender, pos BS Central nervous system: No seizures, no tremors Extremities: No cyanosis, no joint deformities Skin: No rashes, no pallor Psychiatry: Affect normal // no auditory hallucinations   Data Reviewed: I have personally reviewed following labs and imaging studies  CBC: Recent Labs  Lab 06/16/18 1608  WBC 4.3  HGB 10.8*  HCT 34.9*  MCV 97.8  PLT 61*   Basic Metabolic Panel: Recent Labs  Lab 06/16/18 1608 06/17/18 0457 06/18/18 0538  NA 142 143 142  K 3.1* 3.0* 3.8  CL 107 103 102  CO2 24 28 32  GLUCOSE 106* 106* 117*  BUN 16 14 15   CREATININE 1.04* 1.12* 1.40*  CALCIUM 9.3 8.7* 8.6*  MG  --  1.9  --    GFR: Estimated Creatinine Clearance: 24.9 mL/min (A) (by C-G formula based on SCr of 1.4 mg/dL (H)). Liver Function Tests: No results for input(s): AST, ALT, ALKPHOS, BILITOT, PROT, ALBUMIN in the last 168 hours. No results for input(s): LIPASE, AMYLASE in the last 168 hours. No results for input(s): AMMONIA in the last 168 hours. Coagulation Profile: No results for input(s): INR, PROTIME  in the last 168 hours. Cardiac Enzymes: Recent Labs  Lab 06/17/18 0457  TROPONINI <0.03   BNP (last 3 results) No results for input(s): PROBNP in the last 8760 hours. HbA1C: No results for input(s): HGBA1C in the last 72 hours. CBG: Recent Labs  Lab 06/16/18 1621  GLUCAP 91   Lipid Profile: No results for input(s): CHOL, HDL, LDLCALC, TRIG, CHOLHDL, LDLDIRECT in the last 72 hours. Thyroid Function Tests: No results for input(s): TSH, T4TOTAL, FREET4, T3FREE, THYROIDAB in the last 72 hours. Anemia Panel: No results for input(s): VITAMINB12, FOLATE, FERRITIN, TIBC, IRON, RETICCTPCT in the last 72 hours. Sepsis Labs: No results for input(s): PROCALCITON, LATICACIDVEN in the last 168 hours.  No results found for this or any previous visit (from the past 240 hour(s)).   Radiology Studies: Dg Chest 2 View  Result Date: 06/16/2018 CLINICAL DATA:  Four days of chest pain, shortness of breath and bilateral lower extremity swelling. EXAM: CHEST - 2 VIEW COMPARISON:  07/20/2017 FINDINGS: Cardiac pacemaker in stable position. Enlarged cardiac silhouette. Calcific atherosclerotic  disease and tortuosity of the aorta. Mild interstitial pulmonary edema. Small bilateral pleural effusions. Stable appearance of T12 vertebroplasty. Stable posttraumatic changes in the right chest wall. Soft tissues are grossly normal. IMPRESSION: Enlarged cardiac silhouette with mild interstitial pulmonary edema and small bilateral pleural effusions. Electronically Signed   By: Fidela Salisbury M.D.   On: 06/16/2018 15:42    Scheduled Meds: . benazepril  20 mg Oral Q supper  . brimonidine  1 drop Right Eye BID  . carvedilol  25 mg Oral BID WC  . furosemide  40 mg Intravenous Daily  . pantoprazole  40 mg Oral Daily  . polyvinyl alcohol  1 drop Both Eyes BID  . potassium chloride  40 mEq Oral BID  . Rivaroxaban  15 mg Oral Q supper  . sodium chloride flush  3 mL Intravenous Q12H  . timolol  1 drop Right Eye  BID  . vitamin B-12  1,000 mcg Oral BID   Continuous Infusions: . sodium chloride       LOS: 1 day   Marylu Lund, MD [facility] Pager On Amion  If 7PM-7AM, please contact night-coverage 06/18/2018, 2:59 PM

## 2018-06-19 DIAGNOSIS — I5021 Acute systolic (congestive) heart failure: Secondary | ICD-10-CM

## 2018-06-19 LAB — BASIC METABOLIC PANEL
Anion gap: 10 (ref 5–15)
BUN: 16 mg/dL (ref 8–23)
CO2: 30 mmol/L (ref 22–32)
Calcium: 9.1 mg/dL (ref 8.9–10.3)
Chloride: 101 mmol/L (ref 98–111)
Creatinine, Ser: 1.32 mg/dL — ABNORMAL HIGH (ref 0.44–1.00)
GFR calc Af Amer: 40 mL/min — ABNORMAL LOW (ref >60)
GFR calc non Af Amer: 35 mL/min — ABNORMAL LOW (ref >60)
Glucose, Bld: 111 mg/dL — ABNORMAL HIGH (ref 70–99)
Potassium: 4.6 mmol/L (ref 3.5–5.1)
SODIUM: 141 mmol/L (ref 135–145)

## 2018-06-19 NOTE — Progress Notes (Signed)
Progress Note  Patient Name: [name] Date of Encounter: 06/19/2018  Primary Cardiologist: Glori Bickers, MD  Primary Electrophysiologist: Cristopher Peru, MD  Subjective   Slept better overall but got up to chair around 5 AM. Leg swelling persists despite diuresis. She is trying to keep them elevated.   Inpatient Medications    Scheduled Meds: . benazepril  20 mg Oral Q supper  . brimonidine  1 drop Right Eye BID  . carvedilol  25 mg Oral BID WC  . furosemide  40 mg Intravenous Daily  . pantoprazole  40 mg Oral Daily  . polyvinyl alcohol  1 drop Both Eyes BID  . potassium chloride  40 mEq Oral BID  . Rivaroxaban  15 mg Oral Q supper  . sodium chloride flush  3 mL Intravenous Q12H  . timolol  1 drop Right Eye BID  . vitamin B-12  1,000 mcg Oral BID   Continuous Infusions: . sodium chloride     PRN Meds: sodium chloride, acetaminophen, diphenhydrAMINE, hydrALAZINE, ondansetron (ZOFRAN) IV, sodium chloride flush, traZODone   Vital Signs    Vitals:   06/18/18 0548 06/18/18 1938 06/19/18 0436 06/19/18 0912  BP: (!) 134/91 101/65 126/74 (!) 147/78  Pulse: 70 80 89 87  Resp: 20 18 20    Temp: 98.4 F (36.9 C) 98.5 F (36.9 C) 98.2 F (36.8 C)   TempSrc: Oral Oral Oral   SpO2: 96% 96% 93%   Weight: 74.9 kg  74.4 kg   Height:        Intake/Output Summary (Last 24 hours) at 06/19/2018 0939 Last data filed at 06/19/2018 0448 Gross per 24 hour  Intake 360 ml  Output 1400 ml  Net -1040 ml   Filed Weights   06/17/18 0619 06/18/18 0548 06/19/18 0436  Weight: 76.1 kg 74.9 kg 74.4 kg    Telemetry    Atrial fibrillation, rare PVCs, rate controlled - Personally Reviewed  Physical Exam   GEN: Pleasant elderly female sitting up in chair, in NAD Neck: no carotid bruits, elevated JVD Cardiac: irregularly irregular, no murmurs, rubs, or gallops.  Respiratory: good in upper fields, rales in bilateral bases GI: soft, NTND MS: 1+ LE edema bilaterally, only  slight improvement from yesterday; No deformity. Neuro:  Nonfocal, moving all extremities spontaneously Psych: Normal affect   Labs    Chemistry Recent Labs  Lab 06/17/18 0457 06/18/18 0538 06/19/18 0518  NA 143 142 141  K 3.0* 3.8 4.6  CL 103 102 101  CO2 28 32 30  GLUCOSE 106* 117* 111*  BUN 14 15 16   CREATININE 1.12* 1.40* 1.32*  CALCIUM 8.7* 8.6* 9.1  GFRNONAA 42* 32* 35*  GFRAA 49* 37* 40*  ANIONGAP 12 8 10      Hematology Recent Labs  Lab 06/16/18 1608  WBC 4.3  RBC 3.57*  HGB 10.8*  HCT 34.9*  MCV 97.8  MCH 30.3  MCHC 30.9  RDW 13.3  PLT 61*    Cardiac Enzymes Recent Labs  Lab 06/17/18 0457  TROPONINI <0.03    Recent Labs  Lab 06/16/18 1615  TROPIPOC 0.00     BNP Recent Labs  Lab 06/16/18 1608  BNP 362.2*     DDimer No results for input(s): DDIMER in the last 168 hours.   Radiology    No results found.  Cardiac Studies   Echocardiogram 06/17/18: Study Conclusions - Left ventricle: Systolic function was normal. The estimated   ejection fraction was in the range of 55% to  60%. The study is   not technically sufficient to allow evaluation of LV diastolic   function. - Aortic valve: Sclerosis without stenosis. There was trivial   regurgitation. - Mitral valve: Calcified annulus. Mildly thickened leaflets .   There was mild regurgitation. - Left atrium: The atrium was mildly dilated. - Right ventricle: Catheter in RV The cavity size was mildly   dilated. - Right atrium: The atrium was mildly dilated. - Tricuspid valve: There was moderate regurgitation. - Pulmonary arteries: PA peak pressure: 60 mm Hg (S).  Patient Profile     84 y.o. female with a PMH of diastolic CHF, CAD (30-86% stenosis of small caliber dOM2 on LHC in 2005), paroxysmal atrial fibrillation on xarelto, SSS s/p PPM, HTN, who presents with progressive DOE for which cardiology is following.   Assessment & Plan    1. Acute on chronic diastolic CHF: patient  presents with SOB and LE edema. Found to have elevated BNP to 362 and pulmonary edema on CXR.  - Net negative 6.3 L, weight from 77.1 to 74.4 kg - Cr at 1.31 today, down from 1.4 yesterday (1.2 baseline) - Continue IV lasix 40mg  daily as her Cr improved and she remains edematous - Continue to monitor strict I&Os and daily weights. Fluid and sodium restriction. - Continue to monitor electrolytes closely and replete to maintain K>4, Mg>2 - Continue carvedilol and benazepril - echo with preserved EF and elevated right sided pressures - given her CAD and renal status, would avoid NSAIDs (was taking as an outpatient) - encourage leg elevation and ambulation with assistance  2. HTN: not at goal control at home, improving here with medication adjustments.  - Continue carvedilol at increased dose - Continue home benzapril  3. Persistent atrial fibrillation: previously paroxysmal, however has been in persistent AF since 09/2017 based on last device interrogation 02/2018. Has not tolerated amiodarone in the past. - Continue carvedilol at increased dose. Rates well controlled in the 70s-80s. - Continue xarelto for stroke ppx for CHA2DS2-VASc Score of 6 (CHF, HTN, Vascular, Female, and Age >73). She does have chronic thrombocytopenia but has not had any active bleeding. She is on the 15 mg dose for GFR <50. - unlikely to remain in sinus rhythm without antiarrhythmic drug load, and she has not tolerated amiodarone in the past. Therefore, will plan for rate control and not rhythm control.   4. CAD: noted to have 70-80% stenosis of small caliber dOM2 on LHC in 2005. She reports some intermittent atypical chest discomfort. Trops are negative. EKG with atrial fibrillation, non-ischemic. Not on ASA given anticoagulation needs  - no further cardiac work-up.  - she is tender to palpation over the area, likely MSK. Recommend warm compress PRN.  TIME SPENT WITH PATIENT: 15 minutes of direct patient care. More than  50% of that time was spent on coordination of care and counseling regarding heart failure, medications, diuresis, kidney function, chest pain.  She is DNR.  For questions or updates, please contact [place] Please consult [email or web address] for contact info under Cardiology/STEMI.    Signed, Buford Dresser, MD  06/19/2018, 9:39 AM   [phone]

## 2018-06-19 NOTE — Progress Notes (Signed)
PROGRESS NOTE    [name]  HFW:[id number] DOB: [birth year] DOA: 06/16/2018 PCP: Colon Branch, MD    Brief Narrative:  84 y.o. female with medical history significant for coronary artery disease, atrial fibrillation on Xarelto, hypertension, chronic diastolic CHF, chronic kidney disease stage III, ITP, pernicious anemia, and sick sinus syndrome with pacer, now presenting to the emergency department for evaluation of progressive shortness of breath, nausea, swelling, and mild intermittent chest discomfort over the past several days.  She reports a mild cough, denies fevers or chills, reports her dyspnea is worse with exertion, and describes her chest discomfort as mild, intermittent, and without any definite alleviating or exacerbating factors identified.  She reports consistent adherence with her antihypertensives, diuretic, and Xarelto.  Denies any melena or hematochezia.  ED Course: Upon arrival to the ED, patient is found to be afebrile, saturating well on room air, slightly tachypneic, and hypertensive to 170s over low 100s.  EKG features atrial fibrillation and chest x-ray is notable for cardiomegaly with mild interstitial edema and small bilateral pleural effusions.  Chemistry panel is notable for potassium 3.1 and creatinine 1.04, lower than priors.  CBC features a hemoglobin of 10.8 and platelets of 61,000.  Troponin is 0.00 and BNP is elevated to 362.  Cardiology was consulted by the ED physician and recommended medical admission with cardiology to consult.  Patient was treated with 324 mg of aspirin, 40 mg IV Lasix, and 40 mEq oral potassium in the ED.  She remains hypertensive, dyspneic but not in acute distress, and will be observed on the telemetry unit for ongoing evaluation and management.  Assessment & Plan:   Principal Problem:   Acute on chronic diastolic CHF (congestive heart failure) (HCC) Active Problems:   *ANEMIA, PERNICIOUS   Thrombocytopenia (HCC)   *Essential  hypertension   Chest pain   *CKD (chronic kidney disease) stage 3, GFR 30-59 ml/min   Coronary artery disease   Hypokalemia   Hypertensive urgency  1. Acute on chronic diastolic CHF  - Presents with several days of progressive SOB, edema, and intermittent chest discomfort  - Found to be hypervolemic with hypertensive urgency and interstitial edema  - Cardiology following - Cardiology recommends continuing IV lasix  2. Chest pain; CAD  - Reports mild intermittent central chest discomfort for several days  - Hx of Takotsubo CM - Initial troponin is 0.00  - remains chest pain free  3. Atrial fibrillation  - In rate-controlled a fib on admission - CHADS-VASc is 66 (age x2, gender, HTN, CHF, CAD)  - Continue Xarelto, beta-blocker   - Cardiology planning for rate control and not rhythm control  4. Thrombocytopenia  - Platelets appear to be stable at 61k without bleeding at time of admission - Previously followed by hematology, now released back to PCP for q3mo CBC   - No evidence of bleeding  5. Hypokalemia  - Serum potassium is 3.1 on admission  - Normalized -Repeat bmet in AM  6. CKD III  - SCr is 1.04 on admission, lower than priors  - Renally-dose medications, - Recheck bmet in AM  7. Hypertension with hypertensive urgency  - BP 170s/100 in ED - Anticipate improvement with diuresis  - Coreg increased per cardiology recommendation, ACE-i continued - Currently stable  8. Pernicious anemia   - Hgb is 10.8 on admission, down from 11.6 in October  - Denies bleeding; recent drop could be dilutional in setting of hypervolemia  - Continue B12 - Follow CBC -  Remains hemodynamically stable  DVT prophylaxis: Xarelto Code Status: DNR Family Communication: Pt in room, family not at bedside Disposition Plan: Uncertain at this time  Consultants:   Cardiology  Procedures:     Antimicrobials: Anti-infectives (From admission, onward)   None       Subjective: Feels well, still swollen in LE  Objective: Vitals:   06/18/18 0548 06/18/18 1938 06/19/18 0436 06/19/18 0912  BP: (!) 134/91 101/65 126/74 (!) 147/78  Pulse: 70 80 89 87  Resp: 20 18 20    Temp: 98.4 F (36.9 C) 98.5 F (36.9 C) 98.2 F (36.8 C)   TempSrc: Oral Oral Oral   SpO2: 96% 96% 93%   Weight: 74.9 kg  74.4 kg   Height:        Intake/Output Summary (Last 24 hours) at 06/19/2018 1500 Last data filed at 06/19/2018 1300 Gross per 24 hour  Intake 840 ml  Output 2601 ml  Net -1761 ml   Filed Weights   06/17/18 0619 06/18/18 0548 06/19/18 0436  Weight: 76.1 kg 74.9 kg 74.4 kg    Examination: General exam: Awake, laying in bed, in nad Respiratory system: Normal respiratory effort, no wheezing Cardiovascular system: regular rate, s1, s2, BLE edema   Data Reviewed: I have personally reviewed following labs and imaging studies  CBC: Recent Labs  Lab 06/16/18 1608  WBC 4.3  HGB 10.8*  HCT 34.9*  MCV 97.8  PLT 61*   Basic Metabolic Panel: Recent Labs  Lab 06/16/18 1608 06/17/18 0457 06/18/18 0538 06/19/18 0518  NA 142 143 142 141  K 3.1* 3.0* 3.8 4.6  CL 107 103 102 101  CO2 24 28 32 30  GLUCOSE 106* 106* 117* 111*  BUN 16 14 15 16   CREATININE 1.04* 1.12* 1.40* 1.32*  CALCIUM 9.3 8.7* 8.6* 9.1  MG  --  1.9  --   --    GFR: Estimated Creatinine Clearance: 26.3 mL/min (A) (by C-G formula based on SCr of 1.32 mg/dL (H)). Liver Function Tests: No results for input(s): AST, ALT, ALKPHOS, BILITOT, PROT, ALBUMIN in the last 168 hours. No results for input(s): LIPASE, AMYLASE in the last 168 hours. No results for input(s): AMMONIA in the last 168 hours. Coagulation Profile: No results for input(s): INR, PROTIME in the last 168 hours. Cardiac Enzymes: Recent Labs  Lab 06/17/18 0457  TROPONINI <0.03   BNP (last 3 results) No results for input(s): PROBNP in the last 8760 hours. HbA1C: No results for input(s): HGBA1C in the last 72  hours. CBG: Recent Labs  Lab 06/16/18 1621  GLUCAP 91   Lipid Profile: No results for input(s): CHOL, HDL, LDLCALC, TRIG, CHOLHDL, LDLDIRECT in the last 72 hours. Thyroid Function Tests: No results for input(s): TSH, T4TOTAL, FREET4, T3FREE, THYROIDAB in the last 72 hours. Anemia Panel: No results for input(s): VITAMINB12, FOLATE, FERRITIN, TIBC, IRON, RETICCTPCT in the last 72 hours. Sepsis Labs: No results for input(s): PROCALCITON, LATICACIDVEN in the last 168 hours.  No results found for this or any previous visit (from the past 240 hour(s)).   Radiology Studies: No results found.  Scheduled Meds: . benazepril  20 mg Oral Q supper  . brimonidine  1 drop Right Eye BID  . carvedilol  25 mg Oral BID WC  . furosemide  40 mg Intravenous Daily  . pantoprazole  40 mg Oral Daily  . polyvinyl alcohol  1 drop Both Eyes BID  . potassium chloride  40 mEq Oral BID  . Rivaroxaban  15 mg Oral Q supper  . sodium chloride flush  3 mL Intravenous Q12H  . timolol  1 drop Right Eye BID  . vitamin B-12  1,000 mcg Oral BID   Continuous Infusions: . sodium chloride       LOS: 2 days   Marylu Lund, MD [facility] Pager On Amion  If 7PM-7AM, please contact night-coverage 06/19/2018, 3:00 PM

## 2018-06-20 ENCOUNTER — Ambulatory Visit: Payer: Medicare Other | Admitting: Internal Medicine

## 2018-06-20 DIAGNOSIS — I4819 Other persistent atrial fibrillation: Secondary | ICD-10-CM

## 2018-06-20 DIAGNOSIS — E875 Hyperkalemia: Secondary | ICD-10-CM

## 2018-06-20 LAB — BASIC METABOLIC PANEL
Anion gap: 8 (ref 5–15)
BUN: 21 mg/dL (ref 8–23)
CALCIUM: 9.2 mg/dL (ref 8.9–10.3)
CO2: 32 mmol/L (ref 22–32)
Chloride: 101 mmol/L (ref 98–111)
Creatinine, Ser: 1.78 mg/dL — ABNORMAL HIGH (ref 0.44–1.00)
GFR calc non Af Amer: 24 mL/min — ABNORMAL LOW (ref >60)
GFR, EST AFRICAN AMERICAN: 28 mL/min — AB (ref >60)
Glucose, Bld: 112 mg/dL — ABNORMAL HIGH (ref 70–99)
Potassium: 5.5 mmol/L — ABNORMAL HIGH (ref 3.5–5.1)
Sodium: 141 mmol/L (ref 135–145)

## 2018-06-20 LAB — POTASSIUM: Potassium: 5.2 mmol/L — ABNORMAL HIGH (ref 3.5–5.1)

## 2018-06-20 NOTE — Progress Notes (Signed)
Progress Note  Patient Name: [name] Date of Encounter: 06/20/2018  Primary Cardiologist: Glori Bickers, MD (Advanced heart Failure clinic) EP: Dr. Lovena Le  Subjective   Feeling much better. Breathing has improved. No pain.   Inpatient Medications    Scheduled Meds: . benazepril  20 mg Oral Q supper  . brimonidine  1 drop Right Eye BID  . carvedilol  25 mg Oral BID WC  . furosemide  40 mg Intravenous Daily  . pantoprazole  40 mg Oral Daily  . polyvinyl alcohol  1 drop Both Eyes BID  . Rivaroxaban  15 mg Oral Q supper  . sodium chloride flush  3 mL Intravenous Q12H  . timolol  1 drop Right Eye BID  . vitamin B-12  1,000 mcg Oral BID   Continuous Infusions: . sodium chloride     PRN Meds: sodium chloride, acetaminophen, diphenhydrAMINE, hydrALAZINE, ondansetron (ZOFRAN) IV, sodium chloride flush, traZODone   Vital Signs    Vitals:   06/19/18 1628 06/19/18 2029 06/20/18 0433 06/20/18 0845  BP: 119/72 113/67 131/67 116/76  Pulse: 75 64 72 83  Resp:  20 20   Temp:  98.5 F (36.9 C) 97.7 F (36.5 C)   TempSrc:  Oral Oral   SpO2:  96% 95%   Weight:   72.1 kg   Height:        Intake/Output Summary (Last 24 hours) at 06/20/2018 0921 Last data filed at 06/20/2018 0035 Gross per 24 hour  Intake 600 ml  Output 1800 ml  Net -1200 ml   Filed Weights   06/18/18 0548 06/19/18 0436 06/20/18 0433  Weight: 74.9 kg 74.4 kg 72.1 kg    Telemetry    Atrial fib, rate 90s- Personally Reviewed  ECG    No AM EKG- Personally Reviewed  Physical Exam   GEN: No acute distress.   Neck: No JVD Cardiac: irreg irreg, no murmurs, rubs, or gallops.  Respiratory: Clear to auscultation bilaterally. GI: Soft, nontender, non-distended  Ext: Trace bilateral LE edema.  Neuro:  Nonfocal  Psych: Normal affect   Labs    Chemistry Recent Labs  Lab 06/18/18 0538 06/19/18 0518 06/20/18 0541  NA 142 141 141  K 3.8 4.6 5.5*  CL 102 101 101  CO2 32 30 32  GLUCOSE  117* 111* 112*  BUN 15 16 21   CREATININE 1.40* 1.32* 1.78*  CALCIUM 8.6* 9.1 9.2  GFRNONAA 32* 35* 24*  GFRAA 37* 40* 28*  ANIONGAP 8 10 8      Hematology Recent Labs  Lab 06/16/18 1608  WBC 4.3  RBC 3.57*  HGB 10.8*  HCT 34.9*  MCV 97.8  MCH 30.3  MCHC 30.9  RDW 13.3  PLT 61*    Cardiac Enzymes Recent Labs  Lab 06/17/18 0457  TROPONINI <0.03    Recent Labs  Lab 06/16/18 1615  TROPIPOC 0.00     BNP Recent Labs  Lab 06/16/18 1608  BNP 362.2*     DDimer No results for input(s): DDIMER in the last 168 hours.   Radiology    No results found.  Cardiac Studies   Echocardiogram 06/17/18: Study Conclusions - Left ventricle: Systolic function was normal. The estimated ejection fraction was in the range of 55% to 60%. The study is not technically sufficient to allow evaluation of LV diastolic function. - Aortic valve: Sclerosis without stenosis. There was trivial regurgitation. - Mitral valve: Calcified annulus. Mildly thickened leaflets . There was mild regurgitation. - Left atrium: The atrium  was mildly dilated. - Right ventricle: Catheter in RV The cavity size was mildly dilated. - Right atrium: The atrium was mildly dilated. - Tricuspid valve: There was moderate regurgitation. - Pulmonary arteries: PA peak pressure: 60 mm Hg (S).  Patient Profile     84 y.o. female with history of diastolic CHF, CAD (51-89% stenosis of small caliber dOM2 on LHC in 2005), paroxysmal atrial fibrillation on xarelto, SSS s/p PPM, HTN, who was admitted with acute diastolic CHF.    Assessment & Plan    1. Acute on chronic diastolic CHF: She is negative over 7 liters since admission. Creatinine continues to rise. She has already received her am dose of Lasix today. I think she is near a euvolemic state.I would hold further Lasix today. Repeat BMET in am. Resume po Lasix tomorrow if renal function is stable.   2. HTN: BP controlled. NO changes  3. Persistent  atrial fibrillation: Rate controlled. Continue Coreg.   4. Hyperkalemia: repeat potassium level this am.   Probable discharge home tomorrow.  For questions or updates, please contact [facility] Please consult [email or web address] for contact info under   Signed, Lauree Chandler, MD  06/20/2018, 9:21 AM

## 2018-06-20 NOTE — Progress Notes (Signed)
Physical Therapy Treatment Patient Details Name: [name] MRN: [id number] DOB: 18-Aug-1922 Today's Date: 06/20/2018    History of Present Illness Pt is a 84 y/o female admitted secondary to worsening SOB and LE edema. PMH includes CKD, CAD, HTN, a fib, Takotsubo cardiomyopathy, and s/p pacemaker.     PT Comments    Patient progressing well towards PT goals. Improved ambulation distance with supervision for safety. Mild unsteadiness noted but no overt LOB. Pt reports some weakness in BLEs but no buckling noted. 2/4 DOE. VSS throughout. HR in 90s and Sp02 93% on RA. Eager to return home. Encouraged walking with nursing daily. Will follow.    Follow Up Recommendations  Home health PT;Supervision for mobility/OOB     Equipment Recommendations  None recommended by PT    Recommendations for Other Services       Precautions / Restrictions Precautions Precautions: Fall Restrictions Weight Bearing Restrictions: No    Mobility  Bed Mobility               General bed mobility comments: Sitting in chair upon PT arrival.   Transfers Overall transfer level: Needs assistance Equipment used: Straight cane Transfers: Sit to/from Stand Sit to Stand: Supervision         General transfer comment: Supervision for safety. Stood from Youth worker.   Ambulation/Gait Ambulation/Gait assistance: Supervision Gait Distance (Feet): 120 Feet Assistive device: Straight cane Gait Pattern/deviations: Step-through pattern;Decreased stride length Gait velocity: 1.33 ft/sec Gait velocity interpretation: 1.31 - 2.62 ft/sec, indicative of limited community ambulator General Gait Details: Slow, steady gait. Mild unsteadiness noted, however, no overt LOB noted.    Stairs             Wheelchair Mobility    Modified Rankin (Stroke Patients Only)       Balance Overall balance assessment: Needs assistance Sitting-balance support: No upper extremity supported;Feet  supported Sitting balance-Leahy Scale: Good Sitting balance - Comments: Pt donning makeup sitting in chair without difficulty.    Standing balance support: Single extremity supported;During functional activity Standing balance-Leahy Scale: Poor Standing balance comment: Reliant on at least 1 UE support                             Cognition Arousal/Alertness: Awake/alert Behavior During Therapy: WFL for tasks assessed/performed Overall Cognitive Status: Within Functional Limits for tasks assessed                                        Exercises      General Comments General comments (skin integrity, edema, etc.): VSS throughout      Pertinent Vitals/Pain Pain Assessment: No/denies pain    Home Living                      Prior Function            PT Goals (current goals can now be found in the care plan section) Progress towards PT goals: Progressing toward goals    Frequency    Min 3X/week      PT Plan Current plan remains appropriate    Co-evaluation              AM-PAC PT "6 Clicks" Mobility   Outcome Measure  Help needed turning from your back to your side while in a flat bed without using  bedrails?: None Help needed moving from lying on your back to sitting on the side of a flat bed without using bedrails?: None Help needed moving to and from a bed to a chair (including a wheelchair)?: A Little Help needed standing up from a chair using your arms (e.g., wheelchair or bedside chair)?: A Little Help needed to walk in hospital room?: A Little Help needed climbing 3-5 steps with a railing? : A Little 6 Click Score: 20    End of Session Equipment Utilized During Treatment: Gait belt Activity Tolerance: Patient tolerated treatment well Patient left: in chair;with call bell/phone within reach;with chair alarm set Nurse Communication: Mobility status PT Visit Diagnosis: Unsteadiness on feet (R26.81);Other  abnormalities of gait and mobility (R26.89);Muscle weakness (generalized) (M62.81)     Time: 1010-1025 PT Time Calculation (min) (ACUTE ONLY): 15 min  Charges:  $Therapeutic Exercise: 8-22 mins                     Wray Kearns, PT, DPT Acute Rehabilitation Services Pager [phone] Office [phone]       Marguarite Arbour A Sabra Heck 06/20/2018, 11:50 AM

## 2018-06-20 NOTE — Discharge Instructions (Addendum)
Acute Kidney Injury, Adult Acute kidney injury is a sudden worsening of kidney function. The kidneys are organs that have several jobs. They filter the blood to remove waste products and extra fluid. They also maintain a healthy balance of minerals and hormones in the body, which helps control blood pressure and keep bones strong. With this condition, your kidneys do not do their jobs as well as they should. This condition ranges from mild to severe. Over time it may develop into long-lasting (chronic) kidney disease. Early detection and treatment may prevent acute kidney injury from developing into a chronic condition. What are the causes? Common causes of this condition include:  A problem with blood flow to the kidneys. This may be caused by: ? Low blood pressure (hypotension) or shock. ? Blood loss. ? Heart and blood vessel (cardiovascular) disease. ? Severe burns. ? Liver disease.  Direct damage to the kidneys. This may be caused by: ? Certain medicines. ? A kidney infection. ? Poisoning. ? Being around or in contact with toxic substances. ? A surgical wound. ? A hard, direct hit to the kidney area.  A sudden blockage of urine flow. This may be caused by: ? Cancer. ? Kidney stones. ? An enlarged prostate in males.  What are the signs or symptoms? Symptoms of this condition may not be obvious until the condition becomes severe. Symptoms of this condition can include:  Tiredness (lethargy), or difficulty staying awake.  Nausea or vomiting.  Swelling (edema) of the face, legs, ankles, or feet.  Problems with urination, such as: ? Abdominal pain, or pain along the side of your stomach (flank). ? Decreased urine production. ? Decrease in the force of urine flow.  Muscle twitches and cramps, especially in the legs.  Confusion or trouble concentrating.  Loss of appetite.  Fever.  How is this diagnosed? This condition may be diagnosed with tests, including:  Blood  tests.  Urine tests.  Imaging tests.  A test in which a sample of tissue is removed from the kidneys to be examined under a microscope (kidney biopsy).  How is this treated? Treatment for this condition depends on the cause and how severe the condition is. In mild cases, treatment may not be needed. The kidneys may heal on their own. In more severe cases, treatment will involve:  Treating the cause of the kidney injury. This may involve changing any medicines you are taking or adjusting your dosage.  Fluids. You may need specialized IV fluids to balance your body's needs.  Having a catheter placed to drain urine and prevent blockages.  Preventing problems from occurring. This may mean avoiding certain medicines or procedures that can cause further injury to the kidneys.  In some cases treatment may also require:  A procedure to remove toxic wastes from the body (dialysis or continuous renal replacement therapy - CRRT).  Surgery. This may be done to repair a torn kidney, or to remove the blockage from the urinary system.  Follow these instructions at home: Medicines  Take over-the-counter and prescription medicines only as told by your health care provider.  Do not take any new medicines without your health care provider's approval. Many medicines can worsen your kidney damage.  Do not take any vitamin and mineral supplements without your health care provider's approval. Many nutritional supplements can worsen your kidney damage. Lifestyle  If your health care provider prescribed changes to your diet, follow them. You may need to decrease the amount of protein you eat.  Achieve and maintain a healthy weight. If you need help with this, ask your health care provider.  Start or continue an exercise plan. Try to exercise at least 30 minutes a day, 5 days a week.  Do not use any tobacco products, such as cigarettes, chewing tobacco, and e-cigarettes. If you need help quitting, ask  your health care provider. General instructions  Keep track of your blood pressure. Report changes in your blood pressure as told by your health care provider.  Stay up to date with immunizations. Ask your health care provider which immunizations you need.  Keep all follow-up visits as told by your health care provider. This is important. Where to find more information:  American Association of Kidney Patients: BombTimer.gl  National Kidney Foundation: www.kidney.[place]: https://mathis.com/  [facility]: ? www.lifeoptions.org ? www.kidneyschool.org Contact a health care provider if:  Your symptoms get worse.  You develop new symptoms. Get help right away if:  You develop symptoms of worsening kidney disease, which include: ? Headaches. ? Abnormally dark or light skin. ? Easy bruising. ? Frequent hiccups. ? Chest pain. ? Shortness of breath. ? End of menstruation in women. ? Seizures. ? Confusion or altered mental status. ? Abdominal or back pain. ? Itchiness.  You have a fever.  Your body is producing less urine.  You have pain or bleeding when you urinate. Summary  Acute kidney injury is a sudden worsening of kidney function.  Acute kidney injury can be caused by problems with blood flow to the kidneys, direct damage to the kidneys, and sudden blockage of urine flow.  Symptoms of this condition may not be obvious until it becomes severe. Symptoms may include edema, lethargy, confusion, nausea or vomiting, and problems passing urine.  This condition can usually be diagnosed with blood tests, urine tests, and imaging tests. Sometimes a kidney biopsy is done to diagnose this condition.  Treatment for this condition often involves treating the underlying cause. It is treated with fluids, medicines, dialysis, diet changes, or surgery. This information is not intended to replace advice given to you by your health care provider.  Make sure you discuss any questions you have with your health care provider. Document Released: 01/12/2011 Document Revised: 10/29/2016 Document Reviewed: 06/19/2016 Elsevier Interactive Patient Education  2018 [place].   Chronic Kidney Disease, Adult Chronic kidney disease (CKD) happens when the kidneys are damaged during a time of 3 or more months. The kidneys are two organs that do many important jobs in the body. These jobs include:  Removing wastes and extra fluids from the blood.  Making hormones that maintain the amount of fluid in your tissues and blood vessels.  Making sure that the body has the right amount of fluids and chemicals.  Most of the time, this condition does not go away, but it can usually be controlled. Steps must be taken to slow down the kidney damage or stop it from getting worse. Otherwise, the kidneys may stop working. Follow these instructions at home:  Follow your diet as told by your doctor. You may need to avoid alcohol, salty foods (sodium), and foods that are high in potassium, calcium, and protein.  Take over-the-counter and prescription medicines only as told by your doctor. Do not take any new medicines unless your doctor says you can do that. These include vitamins and minerals. ? Medicines and nutritional supplements can make kidney damage worse. ? Your doctor may need to change how much medicine you  take.  Do not use any tobacco products. These include cigarettes, chewing tobacco, and e-cigarettes. If you need help quitting, ask your doctor.  Keep all follow-up visits as told by your doctor. This is important.  Check your blood pressure. Tell your doctor if there are changes to your blood pressure.  Get to a healthy weight. Stay at that weight. If you need help with this, ask your doctor.  Start or continue an exercise plan. Try to exercise at least 30 minutes a day, 5 days a week.  Stay up-to-date with your shots (immunizations) as told  by your doctor. Contact a doctor if:  Your symptoms get worse.  You have new symptoms. Get help right away if:  You have symptoms of end-stage kidney disease. These include: ? Headaches. ? Skin that is darker or lighter than normal. ? Numbness in your hands or feet. ? Easy bruising. ? Having hiccups often. ? Chest pain. ? Shortness of breath. ? Stopping of menstrual periods in women.  You have a fever.  You are making very little pee (urine).  You have pain or bleeding when you pee (urinate). This information is not intended to replace advice given to you by your health care provider. Make sure you discuss any questions you have with your health care provider. Document Released: 09/23/2009 Document Revised: 12/05/2015 Document Reviewed: 02/26/2012 Elsevier Interactive Patient Education  2017 [place].   Heart Failure Heart failure means your heart has trouble pumping blood. This makes it hard for your body to work well. Heart failure is usually a long-term (chronic) condition. You must take good care of yourself and follow your doctor's treatment plan. Follow these instructions at home:  Take your heart medicine as told by your doctor. ? Do not stop taking medicine unless your doctor tells you to. ? Do not skip any dose of medicine. ? Refill your medicines before they run out. ? Take other medicines only as told by your doctor or pharmacist.  Stay active if told by your doctor. The elderly and people with severe heart failure should talk with a doctor about physical activity.  Eat heart-healthy foods. Choose foods that are without trans fat and are low in saturated fat, cholesterol, and salt (sodium). This includes fresh or frozen fruits and vegetables, fish, lean meats, fat-free or low-fat dairy foods, whole grains, and high-fiber foods. Lentils and dried peas and beans (legumes) are also good choices.  Limit salt if told by your doctor.  Cook in a healthy way.  Roast, grill, broil, bake, poach, steam, or stir-fry foods.  Limit fluids as told by your doctor.  Weigh yourself every morning. Do this after you pee (urinate) and before you eat breakfast. Write down your weight to give to your doctor.  Take your blood pressure and write it down if your doctor tells you to.  Ask your doctor how to check your pulse. Check your pulse as told.  Lose weight if told by your doctor.  Stop smoking or chewing tobacco. Do not use gum or patches that help you quit without your doctor's approval.  Schedule and go to doctor visits as told.  Nonpregnant women should have no more than 1 drink a day. Men should have no more than 2 drinks a day. Talk to your doctor about drinking alcohol.  Stop illegal drug use.  Stay current with shots (immunizations).  Manage your health conditions as told by your doctor.  Learn to manage your stress.  Rest when you  are tired.  If it is really hot outside: ? Avoid intense activities. ? Use air conditioning or fans, or get in a cooler place. ? Avoid caffeine and alcohol. ? Wear loose-fitting, lightweight, and light-colored clothing.  If it is really cold outside: ? Avoid intense activities. ? Layer your clothing. ? Wear mittens or gloves, a hat, and a scarf when going outside. ? Avoid alcohol.  Learn about heart failure and get support as needed.  Get help to maintain or improve your quality of life and your ability to care for yourself as needed. Contact a doctor if:  You gain weight quickly.  You are more short of breath than usual.  You cannot do your normal activities.  You tire easily.  You cough more than normal, especially with activity.  You have any or more puffiness (swelling) in areas such as your hands, feet, ankles, or belly (abdomen).  You cannot sleep because it is hard to breathe.  You feel like your heart is beating fast (palpitations).  You get dizzy or light-headed when you stand  up. Get help right away if:  You have trouble breathing.  There is a change in mental status, such as becoming less alert or not being able to focus.  You have chest pain or discomfort.  You faint. This information is not intended to replace advice given to you by your health care provider. Make sure you discuss any questions you have with your health care provider. Document Released: 04/07/2008 Document Revised: 12/05/2015 Document Reviewed: 08/15/2012 Elsevier Interactive Patient Education  2017 [place].   Heart Failure Exacerbation  Heart failure is a condition in which the heart does not fill up with enough blood, and therefore does not pump enough blood and oxygen to the body. When this happens, parts of the body do not get the blood and oxygen they need to function properly. This can cause symptoms such as breathing problems, fatigue, swelling, and confusion. Heart failure exacerbation refers to heart failure symptoms that get worse. The symptoms may get worse suddenly or develop slowly over time. Heart failure exacerbation is a serious medical problem that should be treated right away. What are the causes? A heart failure exacerbation can be triggered by:  Not taking your heart failure medicines correctly.  Infections.  Eating an unhealthy diet or a diet that is high in salt (sodium).  Drinking too much fluid.  Drinking alcohol.  Taking illegal drugs, such as cocaine or methamphetamine.  Not exercising.  Other causes include:  Other heart conditions such as an irregular heartbeat (arrhythmia).  Anemia.  Other medical problems, such as kidney failure.  Sometimes the cause of the exacerbation is not known. What are the signs or symptoms? When heart failure symptoms suddenly or slowly get worse, this may be a sign of heart failure exacerbation. Symptoms of heart failure include:  Breathing problems or shortness of breath.  Chronic coughing or  wheezing.  Fatigue.  Nausea or lack of appetite.  Feeling light-headed.  Confusion or memory loss.  Increased heart rate or irregular heartbeat.  Buildup of fluid in the legs, ankles, feet, or abdomen.  Difficulty breathing when lying down.  How is this diagnosed? This condition is diagnosed based on:  Your symptoms and medical history.  A physical exam.  You may also have tests, including:  Electrocardiogram (ECG). This test measures the electrical activity of your heart.  Echocardiogram. This test uses sound waves to take a picture of your heart to see  how well it works.  Blood tests.  Imaging tests, such as: ? Chest X-ray. ? MRI. ? Ultrasound.  Stress test. This test examines how well your heart functions when you exercise. Your heart is monitored while you exercise on a treadmill or exercise bike. If you cannot exercise, medicines may be used to increase your heartbeat in place of exercise.  Cardiac catheterization. During this test, a thin, flexible tube (catheter) is inserted into a blood vessel and threaded up to your heart. This test allows your health care provider to check the arteries that lead to your heart (coronary arteries).  Right heart catheterization. During this test, the pressure in your heart is measured.  How is this treated? This condition may be treated by:  Adjusting your heart medicines.  Maintaining a healthy lifestyle. This includes: ? Eating a heart-healthy diet that is low in sodium. ? Not using any products that contain nicotine or tobacco, such as cigarettes and e-cigarettes. ? Regular exercise. ? Monitoring your fluid intake. ? Monitoring your weight and reporting changes to your health care provider.  Treating sleep apnea, if you have this condition.  Surgery. This may include: ? Implanting a device that helps both sides of your heart contract at the same time (cardiac resynchronization therapy device). This can help with  heart function and relieve heart failure symptoms. ? Implanting a device that can correct heart rhythm problems (implantable cardioverter defibrillator). ? Connecting a device to your heart to help it pump blood (ventricular assist device). ? Heart transplant.  Follow these instructions at home: Medicines  Take over-the-counter and prescription medicines only as told by your health care provider.  Do not stop taking your medicines or change the amount you take. If you are having problems or side effects from your medicines, talk to your health care provider.  If you are having difficulty paying for your medicines, contact a social worker or your clinic. There are many programs to assist with medicine costs.  Talk to your health care provider before starting any new medicines or supplements.  Make sure your health care provider and pharmacist have a list of all the medicines you are taking. Eating and drinking  Avoid drinking alcohol.  Eat a heart-healthy diet as told by your health care provider. This includes: ? Plenty of fruits and vegetables. ? Lean proteins. ? Low-fat dairy. ? Whole grains. ? Foods that are low in sodium. Activity  Exercise regularly as told by your health care provider. Balance exercise with rest.  Ask your health care provider what activities are safe for you. This includes sexual activity, exercise, and daily tasks at home or work. Lifestyle  Do not use any products that contain nicotine or tobacco, such as cigarettes and e-cigarettes. If you need help quitting, ask your health care provider.  Maintain a healthy weight. Ask your health care provider what weight is healthy for you.  Consider joining a patient support group. This can help with emotional problems you may have, such as stress and anxiety. General instructions  Talk to your health care provider about flu and pneumonia vaccines.  Keep a list of medicines that you are taking. This may help  in emergency situations.  Keep all follow-up visits as told by your health care provider. This is important. Contact a health care provider if:  You have questions about your medicines or you miss a dose.  You feel anxious, depressed, or stressed.  You have swelling in your feet, ankles, legs, or abdomen.  You have shortness of breath during activity or exercise.  You have a cough.  You have a fever.  You have trouble sleeping.  You gain 2-3 lb (1-1.4 kg) in 24 hours or 5 lb (2.3 kg) in a week. Get help right away if:  You have chest pain.  You have shortness of breath while resting.  You have severe fatigue.  You are confused.  You have severe dizziness.  You have a rapid or irregular heartbeat.  You have nausea or you vomit.  You have a cough that is worse at night or you cannot lie flat.  You have a cough that will not go away.  You have severe depression or sadness. Summary  When heart failure symptoms get worse, it is called heart failure exacerbation.  Common causes of this condition include taking medicines incorrectly, infections, and drinking alcohol.  This condition may be treated by adjusting medicines, maintaining a healthy lifestyle, or surgery.  Do not stop taking your medicines or change the amount you take. If you are having problems or side effects from your medicines, talk to your health care provider. This information is not intended to replace advice given to you by your health care provider. Make sure you discuss any questions you have with your health care provider. Document Released: 11/10/2016 Document Revised: 11/10/2016 Document Reviewed: 11/10/2016 Elsevier Interactive Patient Education  2018 [place].   Heart Failure Action Plan A heart failure action plan helps you understand what to do when you have symptoms of heart failure. Follow the plan that was created by you and your health care provider. Review your plan each time you  visit your health care provider. Red zone These signs and symptoms mean you should get medical help right away:  You have trouble breathing when resting.  You have a dry cough that is getting worse.  You have swelling or pain in your legs or abdomen that is getting worse.  You suddenly gain more than 2-3 lb (0.9-1.4 kg) in a day, or more than 5 lb (2.3 kg) in one week. This amount may be more or less depending on your condition.  You have trouble staying awake or you feel confused.  You have chest pain.  You do not have an appetite.  You pass out.  If you experience any of these symptoms:  Call your local emergency services (911 in the [place] right away or seek help at the emergency department of the nearest hospital.  Yellow zone These signs and symptoms mean your condition may be getting worse and you should make some changes:  You have trouble breathing when you are active or you need to sleep with extra pillows.  You have swelling in your legs or abdomen.  You gain 2-3 lb (0.9-1.4 kg) in one day, or 5 lb (2.3 kg) in one week. This amount may be more or less depending on your condition.  You get tired easily.  You have trouble sleeping.  You have a dry cough.  If you experience any of these symptoms:  Contact your health care provider within the next day.  Your health care provider may adjust your medicines.  Green zone These signs mean you are doing well and can continue what you are doing:  You do not have shortness of breath.  You have very little swelling or no new swelling.  Your weight is stable (no gain or loss).  You have a normal activity level.  You do not have  chest pain or any other new symptoms.  Follow these instructions at home:  Take over-the-counter and prescription medicines only as told by your health care provider.  Weigh yourself daily. Your target weight is __________ lb (__________ kg). ? Call your health care provider if you  gain more than __________ lb (__________ kg) in a day, or more than __________ lb (__________ kg) in one week.  Eat a heart-healthy diet. Work with a diet and nutrition specialist (dietitian) to create an eating plan that is best for you.  Keep all follow-up visits as told by your health care provider. This is important. Where to find more information:  American Heart Association: www.heart.org Summary  Follow the action plan that was created by you and your health care provider.  Get help right away if you have any symptoms in the Red zone. This information is not intended to replace advice given to you by your health care provider. Make sure you discuss any questions you have with your health care provider. Document Released: 08/08/2016 Document Revised: 08/08/2016 Document Reviewed: 08/08/2016 Elsevier Interactive Patient Education  2018 [place].   Heart Failure Eating Plan Heart failure, also called congestive heart failure, occurs when your heart does not pump blood well enough to meet your body's needs for oxygen-rich blood. Heart failure is a long-term (chronic) condition. Living with heart failure can be challenging. However, following your health care provider's instructions about a healthy lifestyle and working with a diet and nutrition specialist (dietitian) to choose the right foods may help to improve your symptoms. What are tips for following this plan? General guidelines  Do not eat more than 2,300 mg of salt (sodium) a day. The amount of sodium that is recommended for you may be lower, depending on your condition.  Maintain a healthy body weight as directed. Ask your health care provider what a healthy weight is for you. ? Check your weight every day. ? Work with your health care provider and dietitian to make a plan that is right for you to lose weight or maintain your current weight.  Limit how much fluid you drink. Ask your health care provider or dietitian how  much fluid you can have each day.  Limit or avoid alcohol as told by your health care provider or dietitian. Reading food labels  Check food labels for the amount of sodium per serving. Choose foods that have less than 140 mg (milligrams) of sodium in each serving.  Check food labels for the number of calories per serving. This is important if you need to limit your daily calorie intake to lose weight.  Check food labels for the serving size. If you eat more than one serving, you will be eating more sodium and calories than what is listed on the label.  Look for foods that are labeled as "sodium-free," "very low sodium," or "low sodium." ? Foods labeled as "reduced sodium" or "lightly salted" may still have more sodium than what is recommended for you. Cooking  Avoid adding salt when cooking. Ask your health care provider or dietitian before using salt substitutes.  Season food with salt-free seasonings, spices, or herbs. Check the label of seasoning mixes to make sure they do not contain salt.  Cook with heart-healthy oils, such as olive, canola, soybean, or sunflower oil.  Do not fry foods. Cook foods using low-fat methods, such as baking, boiling, grilling, and broiling.  Limit unhealthy fats when cooking by: ? Removing the skin from poultry, such as  chicken. ? Removing all visible fats from meats. ? Skimming the fat off from stews, soups, and gravies before serving them. Meal planning  Limit your intake of: ? Processed, canned, or pre-packaged foods. ? Foods that are high in trans fat, such as fried foods. ? Sweets, desserts, sugary drinks, and other foods with added sugar. ? Full-fat dairy products, such as whole milk.  Eat a balanced diet that includes: ? 4-5 servings of fruit each day and 4-5 servings of vegetables each day. At each meal, try to fill half of your plate with fruits and vegetables. ? Up to 6-8 servings of whole grains each day. ? Up to 2 servings of lean  meat, poultry, or fish each day. One serving of meat is equal to 3 oz. This is about the same size as a deck of cards. ? 2 servings of low-fat dairy each day. ? Heart-healthy fats. Healthy fats called omega-3 fatty acids are found in foods such as flaxseed and cold-water fish like sardines, salmon, and mackerel.  Aim to eat 25-35 g (grams) of fiber a day. Foods that are high in fiber include apples, broccoli, carrots, beans, peas, and whole grains.  Do not add salt or condiments that contain salt (such as soy sauce) to foods before eating.  When eating at a restaurant, ask that your food be prepared with less salt or no salt, if possible.  Try to eat 2 or more vegetarian meals each week.  Eat more home-cooked food and eat less restaurant, buffet, and fast food. Recommended foods The items listed may not be a complete list. Talk with your dietitian about what dietary choices are best for you. Grains Bread with less than 80 mg of sodium per slice. Whole-wheat pasta, quinoa, and brown rice. Oats and oatmeal. Barley. [place]. Grits and cream of wheat. Whole-grain and whole-wheat cold cereal. Vegetables All fresh vegetables. Vegetables that are frozen without sauce or added salt. Low-sodium or sodium-free canned vegetables. Fruits All fresh, frozen, and canned fruits. Dried fruits, such as raisins, prunes, and cranberries. Meats and other protein foods Lean cuts of meat. Skinless chicken and [place]. Fish with high omega-3 fatty acids, such as salmon, sardines, and other cold-water fishes. Eggs. Dried beans, peas, and edamame. Unsalted nuts and nut butters. Dairy Low-fat or nonfat (skim) milk and dried milk. Rice milk, soy milk, and almond milk. Low-fat or nonfat yogurt. Small amounts of reduced-sodium block cheese. Low-sodium cottage cheese. Fats and oils Olive, canola, soybean, flaxseed, or sunflower oil. Avocado. Sweets and desserts Apple sauce. Granola bars. Sugar-free pudding and gelatin.  Frozen fruit bars. Seasoning and other foods Fresh and dried herbs. Lemon or lime juice. Vinegar. Low-sodium ketchup. Salt-free marinades, salad dressings, sauces, and seasonings. Foods to avoid The items listed may not be a complete list. Talk with your dietitian about what dietary choices are best for you. Grains Bread with more than 80 mg of sodium per slice. Hot or cold cereal with more than 140 mg sodium per serving. Salted pretzels and crackers. Pre-packaged breadcrumbs. Bagels, croissants, and biscuits. Vegetables Canned vegetables. Frozen vegetables with sauce or seasonings. Creamed vegetables. [place] fries. Onion rings. Pickled vegetables and sauerkraut. Fruits Fruits that are dried with sodium-containing preservatives. Meats and other protein foods Ribs and chicken wings. Bacon, ham, pepperoni, bologna, salami, and packaged luncheon meats. Hot dogs, bratwurst, and sausage. Canned meat. Smoked meat and fish. Salted nuts and seeds. Dairy Whole milk, half-and-half, and cream. Buttermilk. Processed cheese, cheese spreads, and cheese curds. Regular cottage  cheese. Feta cheese. Shredded cheese. String cheese. Fats and oils Butter, lard, shortening, ghee, and bacon fat. Canned and packaged gravies. Seasoning and other foods Onion salt, garlic salt, table salt, and sea salt. Marinades. Regular salad dressings. Relishes, pickles, and olives. Meat flavorings and tenderizers, and bouillon cubes. Horseradish, ketchup, and mustard. Worcestershire sauce. Teriyaki sauce, soy sauce (including reduced sodium). Hot sauce and Tabasco sauce. Steak sauce, fish sauce, oyster sauce, and cocktail sauce. Taco seasonings. Barbecue sauce. Tartar sauce. Summary  A heart failure eating plan includes changes that limit your intake of sodium and unhealthy fat, and it may help you lose weight or maintain a healthy weight. Your health care provider may also recommend limiting how much fluid you drink.  Most people  with heart failure should eat no more than 2,300 mg of salt (sodium) a day. The amount of sodium that is recommended for you may be lower, depending on your condition.  Contact your health care provider or dietitian before making any major changes to your diet. This information is not intended to replace advice given to you by your health care provider. Make sure you discuss any questions you have with your health care provider. Document Released: 11/13/2016 Document Revised: 11/13/2016 Document Reviewed: 11/13/2016 Elsevier Interactive Patient Education  2018 [place] With Heart Failure  Heart failure is a long-term (chronic) condition in which the heart cannot pump enough blood through the body. When this happens, parts of the body do not get the blood and oxygen they need. There is no cure for heart failure at this time, so it is important for you to take good care of yourself and follow the treatment plan set by your health care provider. If you are living with heart failure, there are ways to help you manage the disease. Follow these instructions at home: Living with heart failure requires you to make changes in your life. Your health care team will teach you about the changes you need to make in order to relieve your symptoms and lower your risk of going to the hospital. Follow the treatment plan as set by your health care provider. Medicines Medicines are important in reducing your heart's workload, slowing the progression of heart failure, and improving your symptoms.  Take over-the-counter and prescription medicines only as told by your health care provider.  Do not stop taking your medicine unless your health care provider tells you to do that.  Do not skip any dose of your medicine.  Refill prescriptions before you run out of medicine. You need your medicines every day.  Eating and drinking  Eat heart-healthy foods. Talk with a dietitian to make an eating plan that  is right for you. ? If directed by your health care provider: ? Limit salt (sodium). Lowering your sodium intake may reduce symptoms of heart failure. Ask a dietitian to recommend heart-healthy seasonings. ? Limit your fluid intake. Fluid restriction may reduce symptoms of heart failure. ? Use low-fat cooking methods instead of frying. Low-fat methods include roasting, grilling, broiling, baking, poaching, steaming, and stir-frying. ? Choose foods that contain no trans fat and are low in saturated fat and cholesterol. Healthy choices include fresh or frozen fruits and vegetables, fish, lean meats, legumes, fat-free or low-fat dairy products, and whole-grain or high-fiber foods.  Limit alcohol intake to no more than 1 drink a day for nonpregnant women and 2 drinks a day for men. One drink equals 12 oz of beer, 5 oz of wine, or  1 oz of hard liquor. ? Drinking more than that is harmful to your heart. Tell your health care provider if you drink alcohol several times a week. ? Talk with your health care provider about whether any level of alcohol use is safe for you. Activity  Ask your health care provider about attending cardiac rehabilitation. These programs include aerobic physical activity, which provides many benefits for your heart.  If no cardiac rehabilitation program is available, ask your health care provider what aerobic exercises are safe for you to do. Lifestyle Make the lifestyle changes recommended by your health care provider. In general:  Lose weight if your health care provider tells you to do that. Weight loss may reduce symptoms of heart failure.  Do not use any products that contain nicotine or tobacco, such as cigarettes or e-cigarettes. If you need help quitting, ask your health care provider.  Do not use street (illegal) drugs.  Return to your normal activities as told by your health care provider. Ask your health care provider what activities are safe for you.  General  instructions  Make sure you weigh yourself every day to track your weight. Rapid weight gain may indicate an increase in fluid in your body and may increase the workload of your heart. ? Weigh yourself every morning. Do this after you urinate but before you eat breakfast. ? Wear the same type of clothing, without shoes, each time you weigh yourself. ? Weigh yourself on the same scale and in the same spot each time.  Living with chronic heart failure often leads to emotions such as fear, stress, anxiety, and depression. If you feel any of these emotions and need help coping, contact your health care provider. Other ways to get help include: ? Talking to friends and family members about your condition. They can give you support and guidance. Explain your symptoms to them and, if comfortable, invite them to attend appointments or rehabilitation with you. ? Joining a support group for people with chronic heart failure. Talking with other people who have the same symptoms may give you new ways of coping with your disease and your emotions.  Stay up to date with your shots (vaccines). Staying current on pneumococcal and influenza vaccines is especially important in preventing germs from attacking your airways (respiratory infections).  Keep all follow-up visits as told by your health care provider. This is important. How to recognize changes in your condition You and your family members need to know what changes to watch for in your condition. Watch for the following changes and report them to your health care provider:  Sudden weight gain. Ask your health care provider what amount of weight gain to report.  Shortness of breath: ? Feeling short of breath while at rest, with no exercise or activity that required great effort. ? Feeling breathless with activity.  Swelling of your lower legs or ankles.  Difficulty sleeping: ? You wake up feeling short of breath. ? You have to use more pillows to  raise your head in order to sleep.  Frequent, dry, hacking cough.  Loss of appetite.  Feeling more tired all the time.  Depression or feelings of sadness or hopelessness.  Bloating in the stomach.  Where to find more information  Local support groups. Ask your health care provider about groups near you.  The American Heart Association: www.heart.org Contact a health care provider if:  You have a rapid weight gain.  You have increasing shortness of breath that is  unusual for you.  You are unable to participate in your usual physical activities.  You tire easily.  You cough more than normal, especially with physical activity.  You have any swelling or more swelling in areas such as your hands, feet, ankles, or abdomen.  You feel like your heart is beating quickly (palpitations).  You become dizzy or light-headed when you stand up. Get help right away if:  You have difficulty breathing.  You notice or your family notices a change in your awareness, such as having trouble staying awake or having difficulty with concentration.  You have pain or discomfort in your chest.  You have an episode of fainting (syncope). Summary  There is no cure for heart failure, so it is important for you to take good care of yourself and follow the treatment plan set by your health care provider.  Medicines are important in reducing your heart's workload, slowing the progression of heart failure, and improving your symptoms.  Living with chronic heart failure often leads to emotions such as fear, stress, anxiety, and depression. If you are feeling any of these emotions and need help coping, contact your health care provider. This information is not intended to replace advice given to you by your health care provider. Make sure you discuss any questions you have with your health care provider. Document Released: 11/11/2016 Document Revised: 11/11/2016 Document Reviewed: 11/11/2016 Elsevier  Interactive Patient Education  2018 [place].   Heart Failure and Exercise Heart failure is a condition in which the heart does not fill or pump enough blood and oxygen to support your body and its functions. Heart failure is a long-term (chronic) condition. Living with heart failure can be challenging. However, following your health care provider's instructions about a healthy lifestyle may help improve your symptoms. This includes choosing the right exercise plan. Doing daily physical activity is important after a diagnosis of heart failure. You may have some activity restrictions, so talk to your health care provider before doing any exercises. What are the benefits of exercise? Exercise may:  Make your heart muscles stronger.  Lower your blood pressure.  Lower your cholesterol.  Help you lose weight.  Help your bones stay strong.  Improve your blood circulation.  Help your body use oxygen better. This relieves symptoms such as fatigue and shortness of breath.  Help your mental health by lowering the risk of depression and other problems.  Improve your quality of life.  Decrease your chance of hospital admission for heart failure.  What is an exercise plan? An exercise plan is a set of specific exercises and training activities. You will work with your health care provider to create the exercise plan that works for you. The plan may include:  Different types of exercises and how to do them.  Cardiac rehabilitation exercises. These are supervised programs that are designed to strengthen your heart.  What are strengthening exercises? Strengthening exercises are a type of physical activity that involves using resistance to improve your muscle strength. Strengthening exercises usually have repetitive motions. These types of exercises can include:  Lifting weights.  Using weight machines.  Using resistance tubes and bands.  Using kettlebells.  Using your body weight,  such as doing push-ups or squats.  What are balance exercises? Balance exercises are another type of physical activity. They strengthen the muscles of the back, stomach, and pelvis (core muscles) and improve your balance. They can also lower your risk of falling. These types of exercises can include:  Standing on one leg.  Walking backward, sideways, and in a straight line.  Standing up after sitting, without using your hands.  Shifting your weight from one leg to the other.  Lifting one leg in front of you.  Doing tai chi. This is a type of exercise that uses slow movements and deep breathing.  How can I increase my flexibility? Having better flexibility can keep you from falling. It can also lengthen your muscles, improve your range of motion, and help your joints. You can increase your flexibility by:  Doing tai chi.  Doing yoga.  Stretching.  How much aerobic exercise should I get? Aerobic exercises strengthen your breathing and circulation system and increase your body's use of oxygen. Examples of aerobic exercise include biking, walking, running, and swimming. Talk to your health care provider to find out how much aerobic exercise is safe for you.  To do these exercises:  Start exercising slowly, limiting the amount of time at first. You may need to start with 5 minutes of aerobic exercise every day.  Slowly add more minutes until you can safely do at least 30 minutes of exercise at least 4 days a week.  Summary  Daily physical activity is important after a diagnosis of heart failure.  Exercise can make your heart muscles stronger. It also offers other benefits that will improve your health.  Talk to your health care provider before doing any exercises. This information is not intended to replace advice given to you by your health care provider. Make sure you discuss any questions you have with your health care provider. Document Released: 11/10/2016 Document Revised:  11/10/2016 Document Reviewed: 11/10/2016 Elsevier Interactive Patient Education  2018 [place] on my medicine - XARELTO (Rivaroxaban)  This medication education was reviewed with me or my healthcare representative as part of my discharge preparation.   Why was Xarelto prescribed for you? Xarelto was prescribed for you to reduce the risk of a blood clot forming that can cause a stroke if you have a medical condition called atrial fibrillation (a type of irregular heartbeat).  What do you need to know about xarelto ? Take your Xarelto ONCE DAILY at the same time every day with your evening meal. If you have difficulty swallowing the tablet whole, you may crush it and mix in applesauce just prior to taking your dose.  Take Xarelto exactly as prescribed by your doctor and DO NOT stop taking Xarelto without talking to the doctor who prescribed the medication.  Stopping without other stroke prevention medication to take the place of Xarelto may increase your risk of developing a clot that causes a stroke.  Refill your prescription before you run out.  After discharge, you should have regular check-up appointments with your healthcare provider that is prescribing your Xarelto.  In the future your dose may need to be changed if your kidney function or weight changes by a significant amount.  What do you do if you miss a dose? If you are taking Xarelto ONCE DAILY and you miss a dose, take it as soon as you remember on the same day then continue your regularly scheduled once daily regimen the next day. Do not take two doses of Xarelto at the same time or on the same day.   Important Safety Information A possible side effect of Xarelto is bleeding. You should call your healthcare provider right away if you experience any of the following: ? Bleeding from an injury or your nose that  does not stop. ? Unusual colored urine (red or dark brown) or unusual colored stools (red or  black). ? Unusual bruising for unknown reasons. ? A serious fall or if you hit your head (even if there is no bleeding).  Some medicines may interact with Xarelto and might increase your risk of bleeding while on Xarelto. To help avoid this, consult your healthcare provider or pharmacist prior to using any new prescription or non-prescription medications, including herbals, vitamins, non-steroidal anti-inflammatory drugs (NSAIDs) and supplements.  This website has more information on Xarelto: [email or web address]

## 2018-06-20 NOTE — Progress Notes (Signed)
PROGRESS NOTE    [name]  MLY:[id number] DOB: 03-12-1923 DOA: 06/16/2018 PCP: Colon Branch, MD    Brief Narrative:  84 y.o. female with medical history significant for coronary artery disease, atrial fibrillation on Xarelto, hypertension, chronic diastolic CHF, chronic kidney disease stage III, ITP, pernicious anemia, and sick sinus syndrome with pacer, now presenting to the emergency department for evaluation of progressive shortness of breath, nausea, swelling, and mild intermittent chest discomfort over the past several days.  She reports a mild cough, denies fevers or chills, reports her dyspnea is worse with exertion, and describes her chest discomfort as mild, intermittent, and without any definite alleviating or exacerbating factors identified.  She reports consistent adherence with her antihypertensives, diuretic, and Xarelto.  Denies any melena or hematochezia.  ED Course: Upon arrival to the ED, patient is found to be afebrile, saturating well on room air, slightly tachypneic, and hypertensive to 170s over low 100s.  EKG features atrial fibrillation and chest x-ray is notable for cardiomegaly with mild interstitial edema and small bilateral pleural effusions.  Chemistry panel is notable for potassium 3.1 and creatinine 1.04, lower than priors.  CBC features a hemoglobin of 10.8 and platelets of 61,000.  Troponin is 0.00 and BNP is elevated to 362.  Cardiology was consulted by the ED physician and recommended medical admission with cardiology to consult.  Patient was treated with 324 mg of aspirin, 40 mg IV Lasix, and 40 mEq oral potassium in the ED.  She remains hypertensive, dyspneic but not in acute distress, and will be observed on the telemetry unit for ongoing evaluation and management.  Assessment & Plan:   Principal Problem:   Acute on chronic diastolic CHF (congestive heart failure) (HCC) Active Problems:   *ANEMIA, PERNICIOUS   Thrombocytopenia (HCC)   *Essential  hypertension   Chest pain   *CKD (chronic kidney disease) stage 3, GFR 30-59 ml/min   Coronary artery disease   Hypokalemia   Hypertensive urgency  1. Acute on chronic diastolic CHF  - Presents with several days of progressive SOB, edema, and intermittent chest discomfort  - Found to be hypervolemic with hypertensive urgency and interstitial edema  - Cardiology following - Discussed with Cardiology, recommendation to hold lasix for today and repeat bmet in AM, possible resumption of PO lasix if stable tomorrow  2. Chest pain; CAD  - Reports mild intermittent central chest discomfort for several days  - Hx of Takotsubo CM - Initial troponin is 0.00  - remains chest pain free  3. Atrial fibrillation  - In rate-controlled a fib on admission - CHADS-VASc is 77 (age x2, gender, HTN, CHF, CAD)  - Continue Xarelto, beta-blocker   - Cardiology planning for rate control and not rhythm control  4. Thrombocytopenia  - Platelets appear to be stable at 61k without bleeding at time of admission - Previously followed by hematology, now released back to PCP for q45mo CBC   - No evidence of bleeding  5. Hypokalemia  - Serum potassium is 3.1 on admission  - Normalized -Repeat bmet in AM  6. CKD III  - SCr is 1.04 on admission, lower than priors  - Renally-dose medications, - Recheck bmet in AM  7. Hypertension with hypertensive urgency  - BP 170s/100 in ED - Anticipate improvement with diuresis  - Coreg increased per cardiology recommendation, ACE-i continued - Currently stable  8. Pernicious anemia   - Hgb is 10.8 on admission, down from 11.6 in October  - Denies bleeding;  recent drop could be dilutional in setting of hypervolemia  - Continue B12 - Follow CBC - Remains hemodynamically stable  DVT prophylaxis: Xarelto Code Status: DNR Family Communication: Pt in room, family not at bedside Disposition Plan: Uncertain at this time  Consultants:    Cardiology  Procedures:     Antimicrobials: Anti-infectives (From admission, onward)   None      Subjective: Reports some sob overnight  Objective: Vitals:   06/19/18 2029 06/20/18 0433 06/20/18 0845 06/20/18 1200  BP: 113/67 131/67 116/76 105/66  Pulse: 64 72 83 67  Resp: 20 20  20   Temp: 98.5 F (36.9 C) 97.7 F (36.5 C)  (!) 97.4 F (36.3 C)  TempSrc: Oral Oral  Oral  SpO2: 96% 95%  98%  Weight:  72.1 kg    Height:        Intake/Output Summary (Last 24 hours) at 06/20/2018 1447 Last data filed at 06/20/2018 1423 Gross per 24 hour  Intake 600 ml  Output 1400 ml  Net -800 ml   Filed Weights   06/18/18 0548 06/19/18 0436 06/20/18 0433  Weight: 74.9 kg 74.4 kg 72.1 kg    Examination: General exam: Conversant, in no acute distress Respiratory system: normal chest rise, clear, no audible wheezing Cardiovascular system: regular rhythm, s1-s2, BLE edema  Data Reviewed: I have personally reviewed following labs and imaging studies  CBC: Recent Labs  Lab 06/16/18 1608  WBC 4.3  HGB 10.8*  HCT 34.9*  MCV 97.8  PLT 61*   Basic Metabolic Panel: Recent Labs  Lab 06/16/18 1608 06/17/18 0457 06/18/18 0538 06/19/18 0518 06/20/18 0541 06/20/18 1043  NA 142 143 142 141 141  --   K 3.1* 3.0* 3.8 4.6 5.5* 5.2*  CL 107 103 102 101 101  --   CO2 24 28 32 30 32  --   GLUCOSE 106* 106* 117* 111* 112*  --   BUN 16 14 15 16 21   --   CREATININE 1.04* 1.12* 1.40* 1.32* 1.78*  --   CALCIUM 9.3 8.7* 8.6* 9.1 9.2  --   MG  --  1.9  --   --   --   --    GFR: Estimated Creatinine Clearance: 19.2 mL/min (A) (by C-G formula based on SCr of 1.78 mg/dL (H)). Liver Function Tests: No results for input(s): AST, ALT, ALKPHOS, BILITOT, PROT, ALBUMIN in the last 168 hours. No results for input(s): LIPASE, AMYLASE in the last 168 hours. No results for input(s): AMMONIA in the last 168 hours. Coagulation Profile: No results for input(s): INR, PROTIME in the last 168  hours. Cardiac Enzymes: Recent Labs  Lab 06/17/18 0457  TROPONINI <0.03   BNP (last 3 results) No results for input(s): PROBNP in the last 8760 hours. HbA1C: No results for input(s): HGBA1C in the last 72 hours. CBG: Recent Labs  Lab 06/16/18 1621  GLUCAP 91   Lipid Profile: No results for input(s): CHOL, HDL, LDLCALC, TRIG, CHOLHDL, LDLDIRECT in the last 72 hours. Thyroid Function Tests: No results for input(s): TSH, T4TOTAL, FREET4, T3FREE, THYROIDAB in the last 72 hours. Anemia Panel: No results for input(s): VITAMINB12, FOLATE, FERRITIN, TIBC, IRON, RETICCTPCT in the last 72 hours. Sepsis Labs: No results for input(s): PROCALCITON, LATICACIDVEN in the last 168 hours.  No results found for this or any previous visit (from the past 240 hour(s)).   Radiology Studies: No results found.  Scheduled Meds: . benazepril  20 mg Oral Q supper  . brimonidine  1  drop Right Eye BID  . carvedilol  25 mg Oral BID WC  . pantoprazole  40 mg Oral Daily  . polyvinyl alcohol  1 drop Both Eyes BID  . Rivaroxaban  15 mg Oral Q supper  . sodium chloride flush  3 mL Intravenous Q12H  . timolol  1 drop Right Eye BID  . vitamin B-12  1,000 mcg Oral BID   Continuous Infusions: . sodium chloride       LOS: 3 days   Marylu Lund, MD [facility] Pager On Amion  If 7PM-7AM, please contact night-coverage 06/20/2018, 2:47 PM

## 2018-06-21 LAB — BASIC METABOLIC PANEL
Anion gap: 9 (ref 5–15)
BUN: 30 mg/dL — ABNORMAL HIGH (ref 8–23)
CHLORIDE: 98 mmol/L (ref 98–111)
CO2: 32 mmol/L (ref 22–32)
Calcium: 9.4 mg/dL (ref 8.9–10.3)
Creatinine, Ser: 2.08 mg/dL — ABNORMAL HIGH (ref 0.44–1.00)
GFR calc Af Amer: 23 mL/min — ABNORMAL LOW (ref >60)
GFR calc non Af Amer: 20 mL/min — ABNORMAL LOW (ref >60)
Glucose, Bld: 99 mg/dL (ref 70–99)
Potassium: 4.9 mmol/L (ref 3.5–5.1)
Sodium: 139 mmol/L (ref 135–145)

## 2018-06-21 NOTE — Progress Notes (Signed)
Progress Note  Patient Name: [name] Date of Encounter: 06/21/2018  Primary Cardiologist: Glori Bickers, MD (Advanced heart Failure clinic) EP: Dr. Lovena Le  Subjective   Breathing is better.   Inpatient Medications    Scheduled Meds: . benazepril  20 mg Oral Q supper  . brimonidine  1 drop Right Eye BID  . carvedilol  25 mg Oral BID WC  . pantoprazole  40 mg Oral Daily  . polyvinyl alcohol  1 drop Both Eyes BID  . Rivaroxaban  15 mg Oral Q supper  . sodium chloride flush  3 mL Intravenous Q12H  . timolol  1 drop Right Eye BID  . vitamin B-12  1,000 mcg Oral BID   Continuous Infusions: . sodium chloride     PRN Meds: sodium chloride, acetaminophen, diphenhydrAMINE, hydrALAZINE, ondansetron (ZOFRAN) IV, sodium chloride flush, traZODone   Vital Signs    Vitals:   06/20/18 1744 06/20/18 1938 06/21/18 0528 06/21/18 0850  BP: (!) 136/91 115/70 115/65 125/62  Pulse: 74 76 68 79  Resp:  20 16   Temp:  98.2 F (36.8 C) 98 F (36.7 C)   TempSrc:  Oral Oral   SpO2:  97% 95%   Weight:   72.1 kg   Height:        Intake/Output Summary (Last 24 hours) at 06/21/2018 0852 Last data filed at 06/21/2018 0700 Gross per 24 hour  Intake 480 ml  Output 1100 ml  Net -620 ml   Filed Weights   06/19/18 0436 06/20/18 0433 06/21/18 0528  Weight: 74.4 kg 72.1 kg 72.1 kg    Telemetry    Atrial fib, rate 80s  Personally Reviewed  ECG    No AM EKG- Personally Reviewed  Physical Exam    General: Well developed, well nourished, NAD  HEENT: OP clear, mucus membranes moist  SKIN: warm, dry. No rashes. Neuro: No focal deficits  Musculoskeletal: Muscle strength 5/5 all ext  Psychiatric: Mood and affect normal  Neck: No JVD, no carotid bruits, no thyromegaly, no lymphadenopathy.  Lungs:Clear bilaterally, no wheezes, rhonci, crackles Cardiovascular: Irreg irreg. No murmurs, gallops or rubs. Abdomen:Soft. Bowel sounds present. Non-tender.  Extremities: No lower  extremity edema. Pulses are 2 + in the bilateral DP/PT.    Labs    Chemistry Recent Labs  Lab 06/19/18 0518 06/20/18 0541 06/20/18 1043 06/21/18 0510  NA 141 141  --  139  K 4.6 5.5* 5.2* 4.9  CL 101 101  --  98  CO2 30 32  --  32  GLUCOSE 111* 112*  --  99  BUN 16 21  --  30*  CREATININE 1.32* 1.78*  --  2.08*  CALCIUM 9.1 9.2  --  9.4  GFRNONAA 35* 24*  --  20*  GFRAA 40* 28*  --  23*  ANIONGAP 10 8  --  9     Hematology Recent Labs  Lab 06/16/18 1608  WBC 4.3  RBC 3.57*  HGB 10.8*  HCT 34.9*  MCV 97.8  MCH 30.3  MCHC 30.9  RDW 13.3  PLT 61*    Cardiac Enzymes Recent Labs  Lab 06/17/18 0457  TROPONINI <0.03    Recent Labs  Lab 06/16/18 1615  TROPIPOC 0.00     BNP Recent Labs  Lab 06/16/18 1608  BNP 362.2*     DDimer No results for input(s): DDIMER in the last 168 hours.   Radiology    No results found.  Cardiac Studies  Echocardiogram 06/17/18: Study Conclusions - Left ventricle: Systolic function was normal. The estimated ejection fraction was in the range of 55% to 60%. The study is not technically sufficient to allow evaluation of LV diastolic function. - Aortic valve: Sclerosis without stenosis. There was trivial regurgitation. - Mitral valve: Calcified annulus. Mildly thickened leaflets . There was mild regurgitation. - Left atrium: The atrium was mildly dilated. - Right ventricle: Catheter in RV The cavity size was mildly dilated. - Right atrium: The atrium was mildly dilated. - Tricuspid valve: There was moderate regurgitation. - Pulmonary arteries: PA peak pressure: 60 mm Hg (S).  Patient Profile     84 y.o. female with history of diastolic CHF, CAD (68-25% stenosis of small caliber dOM2 on LHC in 2005), paroxysmal atrial fibrillation on xarelto, SSS s/p PPM, HTN, who was admitted with acute diastolic CHF.    Assessment & Plan    1. Acute on chronic diastolic CHF: Negative 8.1 liters since admission. She  appears to be euvolemic. Creatinine up to 2.08. I would hold Lasix today. Monitor today to make sure renal function is normalizing prior to discharge.   2. HTN: BP is well controlled. No changes  3. Persistent atrial fibrillation: Rate is well controlled. Continue Coreg.    For questions or updates, please contact [place] Please consult [email or web address] for contact info under   Signed, Lauree Chandler, MD  06/21/2018, 8:52 AM

## 2018-06-21 NOTE — Care Management Note (Signed)
Case Management Note  Patient Details  Name: VENETTA KNEE MRN: [id number] Date of Birth: May 21, 1923  Subjective/Objective:    CHF               Action/Plan: Patient lives at home alone; PCP is Dr Larose Kells; has private insurance with Medicare; [facility]; DME - cane at home; has scales at home and knows to weigh herself daily; she states that she is trying to decrease her salt intake; [place] choice offered, pt requested [facility]. Tiffany with [facility] called for arrangements.CM will continue to follow patient for progression of care.  Expected Discharge Date:       Possibly 06/22/2018           Expected Discharge Plan:  [place]  In-House Referral:   [facility] for CHF  Discharge planning Services  CM Consult  HH Arranged:  RN, Disease Management, PT [place] Agency:  [facility] (formerly [facility])  Status of Service:  In process, will continue to follow  Sherrilyn Rist [phone] 06/21/2018, 1:40 PM

## 2018-06-21 NOTE — Care Management Important Message (Signed)
Important Message  Patient Details  Name: [name] MRN: [id number] Date of Birth: [birth year]   Medicare Important Message Given:  Yes    Ranulfo Kall P [name] 06/21/2018, 2:38 PM

## 2018-06-21 NOTE — Plan of Care (Signed)
  Problem: Education: Goal: Knowledge of General Education information will improve Description Including pain rating scale, medication(s)/side effects and non-pharmacologic comfort measures Outcome: Progressing   Problem: Health Behavior/Discharge Planning: Goal: Ability to manage health-related needs will improve Outcome: Progressing   

## 2018-06-21 NOTE — Progress Notes (Signed)
PROGRESS NOTE    [name]  CHE:[id number] DOB: 04/05/1923 DOA: 06/16/2018 PCP: Colon Branch, MD    Brief Narrative:  84 y.o. female with medical history significant for coronary artery disease, atrial fibrillation on Xarelto, hypertension, chronic diastolic CHF, chronic kidney disease stage III, ITP, pernicious anemia, and sick sinus syndrome with pacer, now presenting to the emergency department for evaluation of progressive shortness of breath, nausea, swelling, and mild intermittent chest discomfort over the past several days.  She reports a mild cough, denies fevers or chills, reports her dyspnea is worse with exertion, and describes her chest discomfort as mild, intermittent, and without any definite alleviating or exacerbating factors identified.  She reports consistent adherence with her antihypertensives, diuretic, and Xarelto.  Denies any melena or hematochezia.  ED Course: Upon arrival to the ED, patient is found to be afebrile, saturating well on room air, slightly tachypneic, and hypertensive to 170s over low 100s.  EKG features atrial fibrillation and chest x-ray is notable for cardiomegaly with mild interstitial edema and small bilateral pleural effusions.  Chemistry panel is notable for potassium 3.1 and creatinine 1.04, lower than priors.  CBC features a hemoglobin of 10.8 and platelets of 61,000.  Troponin is 0.00 and BNP is elevated to 362.  Cardiology was consulted by the ED physician and recommended medical admission with cardiology to consult.  Patient was treated with 324 mg of aspirin, 40 mg IV Lasix, and 40 mEq oral potassium in the ED.  She remains hypertensive, dyspneic but not in acute distress, and will be observed on the telemetry unit for ongoing evaluation and management.  Assessment & Plan:   Principal Problem:   Acute on chronic diastolic CHF (congestive heart failure) (HCC) Active Problems:   *ANEMIA, PERNICIOUS   Thrombocytopenia (HCC)   *Essential  hypertension   Chest pain   *CKD (chronic kidney disease) stage 3, GFR 30-59 ml/min   Coronary artery disease   Hypokalemia   Hypertensive urgency  1. Acute on chronic diastolic CHF  - Presents with several days of progressive SOB, edema, and intermittent chest discomfort  - Found to be hypervolemic with hypertensive urgency and interstitial edema  - Cardiology following - Discussed with Cardiology, Cr higher today, thus will repeat bmet in AM. Per cardiology, recommendation to monitor overnight  2. Chest pain; CAD  - Reports mild intermittent central chest discomfort for several days  - Hx of Takotsubo CM - Initial troponin is 0.00  - remains chest pain free  3. Atrial fibrillation  - In rate-controlled a fib on admission - CHADS-VASc is 1 (age x2, gender, HTN, CHF, CAD)  - Continue Xarelto, beta-blocker   - Cardiology planning for rate control and not rhythm control  4. Thrombocytopenia  - Platelets appear to be stable at 61k without bleeding at time of admission - Previously followed by hematology, now released back to PCP for q80mo CBC   - No evidence of bleeding  5. Hypokalemia  - Serum potassium is 3.1 on admission  - Normalized -Repeat bmet in AM  6. CKD III  - SCr is 1.04 on admission, lower than priors  - Renally-dose medications, - Recheck bmet in AM  7. Hypertension with hypertensive urgency  - BP 170s/100 in ED - Anticipate improvement with diuresis  - Coreg increased per cardiology recommendation, ACE-i continued - Currently stable  8. Pernicious anemia   - Hgb is 10.8 on admission, down from 11.6 in October  - Denies bleeding; recent drop could be  dilutional in setting of hypervolemia  - Continue B12 - Follow CBC - Remains hemodynamically stable  DVT prophylaxis: Xarelto Code Status: DNR Family Communication: Pt in room, family not at bedside Disposition Plan: Uncertain at this time  Consultants:   Cardiology  Procedures:      Antimicrobials: Anti-infectives (From admission, onward)   None      Subjective: Eager to go home   Objective: Vitals:   06/21/18 0528 06/21/18 0850 06/21/18 1224 06/21/18 1556  BP: 115/65 125/62 99/63   Pulse: 68 79 76   Resp: 16  18   Temp: 98 F (36.7 C)   97.9 F (36.6 C)  TempSrc: Oral   Oral  SpO2: 95%  96%   Weight: 72.1 kg     Height:        Intake/Output Summary (Last 24 hours) at 06/21/2018 1618 Last data filed at 06/21/2018 1557 Gross per 24 hour  Intake 600 ml  Output 1152 ml  Net -552 ml   Filed Weights   06/19/18 0436 06/20/18 0433 06/21/18 0528  Weight: 74.4 kg 72.1 kg 72.1 kg    Examination: General exam: Awake, laying in bed, in nad Respiratory system: Normal respiratory effort, no wheezing Cardiovascular system: regular rate, s1, s2  Data Reviewed: I have personally reviewed following labs and imaging studies  CBC: Recent Labs  Lab 06/16/18 1608  WBC 4.3  HGB 10.8*  HCT 34.9*  MCV 97.8  PLT 61*   Basic Metabolic Panel: Recent Labs  Lab 06/17/18 0457 06/18/18 0538 06/19/18 0518 06/20/18 0541 06/20/18 1043 06/21/18 0510  NA 143 142 141 141  --  139  K 3.0* 3.8 4.6 5.5* 5.2* 4.9  CL 103 102 101 101  --  98  CO2 28 32 30 32  --  32  GLUCOSE 106* 117* 111* 112*  --  99  BUN 14 15 16 21   --  30*  CREATININE 1.12* 1.40* 1.32* 1.78*  --  2.08*  CALCIUM 8.7* 8.6* 9.1 9.2  --  9.4  MG 1.9  --   --   --   --   --    GFR: Estimated Creatinine Clearance: 16.5 mL/min (A) (by C-G formula based on SCr of 2.08 mg/dL (H)). Liver Function Tests: No results for input(s): AST, ALT, ALKPHOS, BILITOT, PROT, ALBUMIN in the last 168 hours. No results for input(s): LIPASE, AMYLASE in the last 168 hours. No results for input(s): AMMONIA in the last 168 hours. Coagulation Profile: No results for input(s): INR, PROTIME in the last 168 hours. Cardiac Enzymes: Recent Labs  Lab 06/17/18 0457  TROPONINI <0.03   BNP (last 3 results) No  results for input(s): PROBNP in the last 8760 hours. HbA1C: No results for input(s): HGBA1C in the last 72 hours. CBG: Recent Labs  Lab 06/16/18 1621  GLUCAP 91   Lipid Profile: No results for input(s): CHOL, HDL, LDLCALC, TRIG, CHOLHDL, LDLDIRECT in the last 72 hours. Thyroid Function Tests: No results for input(s): TSH, T4TOTAL, FREET4, T3FREE, THYROIDAB in the last 72 hours. Anemia Panel: No results for input(s): VITAMINB12, FOLATE, FERRITIN, TIBC, IRON, RETICCTPCT in the last 72 hours. Sepsis Labs: No results for input(s): PROCALCITON, LATICACIDVEN in the last 168 hours.  No results found for this or any previous visit (from the past 240 hour(s)).   Radiology Studies: No results found.  Scheduled Meds: . benazepril  20 mg Oral Q supper  . brimonidine  1 drop Right Eye BID  . carvedilol  25  mg Oral BID WC  . pantoprazole  40 mg Oral Daily  . polyvinyl alcohol  1 drop Both Eyes BID  . Rivaroxaban  15 mg Oral Q supper  . sodium chloride flush  3 mL Intravenous Q12H  . timolol  1 drop Right Eye BID  . vitamin B-12  1,000 mcg Oral BID   Continuous Infusions: . sodium chloride       LOS: 4 days   Marylu Lund, MD [facility] Pager On Amion  If 7PM-7AM, please contact night-coverage 06/21/2018, 4:18 PM

## 2018-06-22 DIAGNOSIS — N289 Disorder of kidney and ureter, unspecified: Secondary | ICD-10-CM

## 2018-06-22 DIAGNOSIS — N189 Chronic kidney disease, unspecified: Secondary | ICD-10-CM

## 2018-06-22 LAB — BASIC METABOLIC PANEL
Anion gap: 12 (ref 5–15)
BUN: 34 mg/dL — ABNORMAL HIGH (ref 8–23)
CO2: 27 mmol/L (ref 22–32)
Calcium: 9.4 mg/dL (ref 8.9–10.3)
Chloride: 99 mmol/L (ref 98–111)
Creatinine, Ser: 2.14 mg/dL — ABNORMAL HIGH (ref 0.44–1.00)
GFR calc non Af Amer: 19 mL/min — ABNORMAL LOW (ref >60)
GFR, EST AFRICAN AMERICAN: 22 mL/min — AB (ref >60)
Glucose, Bld: 99 mg/dL (ref 70–99)
Potassium: 4.6 mmol/L (ref 3.5–5.1)
SODIUM: 138 mmol/L (ref 135–145)

## 2018-06-22 MED ORDER — SODIUM CHLORIDE 0.9 % IV SOLN
INTRAVENOUS | Status: AC
  Administered 2018-06-22: 09:00:00 via INTRAVENOUS

## 2018-06-22 NOTE — Progress Notes (Addendum)
Physical Therapy Treatment Patient Details Name: [name] MRN: [id number] DOB: Feb 03, 1923 Today's Date: 06/22/2018    History of Present Illness Pt is a 84 y.o. female admitted 06/16/18 secondary to worsening SOB and LE edema; worked up for acute on chronic diastolic CHF and acute worsening renal function. PMH includes CKD, CAD, HTN, a fib, Takotsubo cardiomyopathy, and s/p pacemaker.    PT Comments    Pt progressing with mobility. Ambulatory with SPC at supervision-level; 1x bout of instability, pt able to self-correct. Motivated to return home where she lives alone and has assist from son as needed. Will continue to follow acutely.   Follow Up Recommendations  Home health PT;Supervision for mobility/OOB     Equipment Recommendations  None recommended by PT    Recommendations for Other Services       Precautions / Restrictions Precautions Precautions: Fall Restrictions Weight Bearing Restrictions: No    Mobility  Bed Mobility Overal bed mobility: Modified Independent             General bed mobility comments: HOB elevated  Transfers Overall transfer level: Needs assistance Equipment used: Straight cane;None Transfers: Sit to/from Stand Sit to Stand: Supervision         General transfer comment: Stood from bed without DME and recliner with SPC; supervision for safety, no physical assist required  Ambulation/Gait Ambulation/Gait assistance: Supervision Gait Distance (Feet): 130 Feet Assistive device: Straight cane Gait Pattern/deviations: Step-through pattern;Decreased stride length   Gait velocity interpretation: 1.31 - 2.62 ft/sec, indicative of limited community ambulator General Gait Details: Slow, steady ambulation with SPC and supervision for safety. 1x self-corrected lateral LOB   Stairs             Wheelchair Mobility    Modified Rankin (Stroke Patients Only)       Balance Overall balance assessment: Needs  assistance Sitting-balance support: No upper extremity supported;Feet supported Sitting balance-Leahy Scale: Good     Standing balance support: Single extremity supported;During functional activity;No upper extremity supported   Standing balance comment: Able to static stand and take short steps without UE support and min guard                            Cognition Arousal/Alertness: Awake/alert Behavior During Therapy: WFL for tasks assessed/performed Overall Cognitive Status: Within Functional Limits for tasks assessed                                        Exercises      General Comments        Pertinent Vitals/Pain Pain Assessment: No/denies pain    Home Living                      Prior Function            PT Goals (current goals can now be found in the care plan section) Acute Rehab PT Goals Patient Stated Goal: to go home  PT Goal Formulation: With patient Time For Goal Achievement: 07/01/18 Potential to Achieve Goals: Good Progress towards PT goals: Progressing toward goals    Frequency    Min 3X/week      PT Plan Current plan remains appropriate    Co-evaluation              AM-PAC PT "6 Clicks" Mobility   Outcome Measure  Help needed turning from your back to your side while in a flat bed without using bedrails?: None Help needed moving from lying on your back to sitting on the side of a flat bed without using bedrails?: None Help needed moving to and from a bed to a chair (including a wheelchair)?: None Help needed standing up from a chair using your arms (e.g., wheelchair or bedside chair)?: A Little Help needed to walk in hospital room?: A Little Help needed climbing 3-5 steps with a railing? : A Little 6 Click Score: 21    End of Session Equipment Utilized During Treatment: Gait belt Activity Tolerance: Patient tolerated treatment well Patient left: in chair;with call bell/phone within reach;with  chair alarm set Nurse Communication: Mobility status PT Visit Diagnosis: Unsteadiness on feet (R26.81);Other abnormalities of gait and mobility (R26.89);Muscle weakness (generalized) (M62.81)     Time: 1350-1405 PT Time Calculation (min) (ACUTE ONLY): 15 min  Charges:  $Gait Training: 8-22 mins                    Mabeline Caras, PT, DPT Acute Rehabilitation Services  Pager [phone] Office [place] 06/22/2018, 2:24 PM

## 2018-06-22 NOTE — Progress Notes (Signed)
Progress Note  Patient Name: [name] Date of Encounter: 06/22/2018  Primary Cardiologist: Glori Bickers, MD (Advanced heart Failure clinic) EP: Dr. Lovena Le  Subjective   No chest pain or dyspnea.   Inpatient Medications    Scheduled Meds: . benazepril  20 mg Oral Q supper  . brimonidine  1 drop Right Eye BID  . carvedilol  25 mg Oral BID WC  . pantoprazole  40 mg Oral Daily  . polyvinyl alcohol  1 drop Both Eyes BID  . Rivaroxaban  15 mg Oral Q supper  . sodium chloride flush  3 mL Intravenous Q12H  . timolol  1 drop Right Eye BID  . vitamin B-12  1,000 mcg Oral BID   Continuous Infusions: . sodium chloride     PRN Meds: sodium chloride, acetaminophen, diphenhydrAMINE, hydrALAZINE, ondansetron (ZOFRAN) IV, sodium chloride flush, traZODone   Vital Signs    Vitals:   06/21/18 1556 06/21/18 1750 06/21/18 1956 06/22/18 0452  BP:  112/60 98/68 113/63  Pulse:  78 67 79  Resp:   18 18  Temp: 97.9 F (36.6 C)  97.8 F (36.6 C) 97.6 F (36.4 C)  TempSrc: Oral  Oral Oral  SpO2:   96% 100%  Weight:    72.3 kg  Height:        Intake/Output Summary (Last 24 hours) at 06/22/2018 0813 Last data filed at 06/22/2018 0453 Gross per 24 hour  Intake 480 ml  Output 1027 ml  Net -547 ml   Filed Weights   06/20/18 0433 06/21/18 0528 06/22/18 0452  Weight: 72.1 kg 72.1 kg 72.3 kg    Telemetry    Atrial fib-Personally Reviewed  ECG    No AM EKG- Personally Reviewed  Physical Exam   General: Well developed, well nourished, NAD  HEENT: OP clear, mucus membranes moist  SKIN: warm, dry. No rashes. Neuro: No focal deficits  Musculoskeletal: Muscle strength 5/5 all ext  Psychiatric: Mood and affect normal  Neck: No JVD, no carotid bruits, no thyromegaly, no lymphadenopathy.  Lungs:Clear bilaterally, no wheezes, rhonci, crackles Cardiovascular: Irreg irreg. No murmurs, gallops or rubs. Abdomen:Soft. Bowel sounds present. Non-tender.  Extremities: No  lower extremity edema. Pulses are 2 + in the bilateral DP/PT.   Labs    Chemistry Recent Labs  Lab 06/20/18 0541 06/20/18 1043 06/21/18 0510 06/22/18 0526  NA 141  --  139 138  K 5.5* 5.2* 4.9 4.6  CL 101  --  98 99  CO2 32  --  32 27  GLUCOSE 112*  --  99 99  BUN 21  --  30* 34*  CREATININE 1.78*  --  2.08* 2.14*  CALCIUM 9.2  --  9.4 9.4  GFRNONAA 24*  --  20* 19*  GFRAA 28*  --  23* 22*  ANIONGAP 8  --  9 12     Hematology Recent Labs  Lab 06/16/18 1608  WBC 4.3  RBC 3.57*  HGB 10.8*  HCT 34.9*  MCV 97.8  MCH 30.3  MCHC 30.9  RDW 13.3  PLT 61*    Cardiac Enzymes Recent Labs  Lab 06/17/18 0457  TROPONINI <0.03    Recent Labs  Lab 06/16/18 1615  TROPIPOC 0.00     BNP Recent Labs  Lab 06/16/18 1608  BNP 362.2*     DDimer No results for input(s): DDIMER in the last 168 hours.   Radiology    No results found.  Cardiac Studies   Echocardiogram 06/17/18:  Study Conclusions - Left ventricle: Systolic function was normal. The estimated ejection fraction was in the range of 55% to 60%. The study is not technically sufficient to allow evaluation of LV diastolic function. - Aortic valve: Sclerosis without stenosis. There was trivial regurgitation. - Mitral valve: Calcified annulus. Mildly thickened leaflets . There was mild regurgitation. - Left atrium: The atrium was mildly dilated. - Right ventricle: Catheter in RV The cavity size was mildly dilated. - Right atrium: The atrium was mildly dilated. - Tricuspid valve: There was moderate regurgitation. - Pulmonary arteries: PA peak pressure: 60 mm Hg (S).  Patient Profile     84 y.o. female with history of diastolic CHF, CAD (53-01% stenosis of small caliber dOM2 on LHC in 2005), paroxysmal atrial fibrillation on xarelto, SSS s/p PPM, HTN, who was admitted with acute diastolic CHF.    Assessment & Plan    1. Acute on chronic diastolic CHF: She is negative 8.7 liters since  admission. She is feeling much better. Acute worsening of renal function with diuresis. Creatinine stable today from 2.08 to 2.14 but baseline is probably around 1.2 to 1.4. I would favor gentle hydration today and repeat BMET in the am. Hold Lisinopril. Continue to hold Lasix.    2. HTN: BP is controlled. No changes today.   3. Persistent atrial fibrillation: Rate is controlled. Continue beta blocker.   For questions or updates, please contact [place] Please consult [email or web address] for contact info under   Signed, Lauree Chandler, MD  06/22/2018, 8:13 AM

## 2018-06-22 NOTE — Progress Notes (Addendum)
PROGRESS NOTE  [name] AJG:[id number] DOB: 16-Jul-1922 DOA: 06/16/2018 PCP: Colon Branch, MD  HPI/Recap of past 24 hours:  84 y.o.femalewith medical history significant forcoronary artery disease, atrial fibrillation on Xarelto, hypertension, chronic diastolic CHF, chronic kidney disease stage III, ITP, pernicious anemia, and sick sinus syndrome with pacer, now presenting to the emergency department for evaluation of progressive shortness of breath, nausea, swelling, and mild intermittent chest discomfort over the past several days. She reports a mild cough, denies fevers or chills, reports her dyspnea is worse with exertion, and describes her chest discomfort as mild, intermittent, and without any definite alleviating or exacerbating factors identified. She reports consistent adherence with her antihypertensives, diuretic, and Xarelto. Denies any melena or hematochezia.  06/22/2018: Patient seen and examined her bedside.  States she feels better however still has some dyspnea with movement.   Assessment/Plan: Principal Problem:   Acute on chronic diastolic CHF (congestive heart failure) (HCC) Active Problems:   *ANEMIA, PERNICIOUS   Thrombocytopenia (HCC)   *Essential hypertension   Chest pain   *CKD (chronic kidney disease) stage 3, GFR 30-59 ml/min   Coronary artery disease   Hypokalemia   Hypertensive urgency  Acute on chronic diastolic CHF Continue cardiac medications as recommended by cardiology Last 2D echo done revealed normal LVEF  Cardiology following  AKI on CKD 3, worsening Baseline creatinine appears to be 1.3 with GFR of 35 Creatinine today 2.14 with GFR of 19 from 35, 3 days ago Hold off Lasix Recommend hold off benazepril; defer to cardiology Repeat BMP in the morning Avoid nephrotoxic agents  Chronic A. fib Rate controlled on metoprolol Continue Xarelto for CVA prophylaxis  Uncontrolled hypertension, resolved  Ambulatory dysfunction/physical  debility PT assessed and recommended home health PT Fall precaution Continue PT   DVT prophylaxis: Xarelto Code Status: DNR Family Communication: Pt in room, family not at bedside Disposition Plan:  Possible DC to home with home health PT tomorrow 06/23/2018 once renal function is improving  Consultants:   Cardiology  Procedures:      Objective: Vitals:   06/22/18 0452 06/22/18 0853 06/22/18 1237 06/22/18 1742  BP: 113/63 (!) 141/92 105/63 (!) 103/57  Pulse: 79 81 70 70  Resp: 18 18 18    Temp: 97.6 F (36.4 C)  (!) 97.3 F (36.3 C)   TempSrc: Oral  Oral   SpO2: 100% 98% 100%   Weight: 72.3 kg     Height:        Intake/Output Summary (Last 24 hours) at 06/22/2018 1841 Last data filed at 06/22/2018 1700 Gross per 24 hour  Intake 983.73 ml  Output 775 ml  Net 208.73 ml   Filed Weights   06/20/18 0433 06/21/18 0528 06/22/18 0452  Weight: 72.1 kg 72.1 kg 72.3 kg    Exam:  . General: 84 y.o. year-old female well developed well nourished in no acute distress.  Alert and oriented x3. . Cardiovascular: Regular rate and rhythm with no rubs or gallops.  No thyromegaly or JVD noted.   Marland Kitchen Respiratory: Diffuse rales bilaterally. Good inspiratory effort. . Abdomen: Soft nontender nondistended with normal bowel sounds x4 quadrants. . Musculoskeletal: Trace lower extremity edema. 2/4 pulses in all 4 extremities. . Skin: No ulcerative lesions noted or rashes, . Psychiatry: Mood is appropriate for condition and setting   Data Reviewed: CBC: Recent Labs  Lab 06/16/18 1608  WBC 4.3  HGB 10.8*  HCT 34.9*  MCV 97.8  PLT 61*   Basic Metabolic Panel: Recent Labs  Lab 06/17/18 0457 06/18/18 0538 06/19/18 0518 06/20/18 0541 06/20/18 1043 06/21/18 0510 06/22/18 0526  NA 143 142 141 141  --  139 138  K 3.0* 3.8 4.6 5.5* 5.2* 4.9 4.6  CL 103 102 101 101  --  98 99  CO2 28 32 30 32  --  32 27  GLUCOSE 106* 117* 111* 112*  --  99 99  BUN 14 15 16 21   --  30* 34*    CREATININE 1.12* 1.40* 1.32* 1.78*  --  2.08* 2.14*  CALCIUM 8.7* 8.6* 9.1 9.2  --  9.4 9.4  MG 1.9  --   --   --   --   --   --    GFR: Estimated Creatinine Clearance: 16 mL/min (A) (by C-G formula based on SCr of 2.14 mg/dL (H)). Liver Function Tests: No results for input(s): AST, ALT, ALKPHOS, BILITOT, PROT, ALBUMIN in the last 168 hours. No results for input(s): LIPASE, AMYLASE in the last 168 hours. No results for input(s): AMMONIA in the last 168 hours. Coagulation Profile: No results for input(s): INR, PROTIME in the last 168 hours. Cardiac Enzymes: Recent Labs  Lab 06/17/18 0457  TROPONINI <0.03   BNP (last 3 results) No results for input(s): PROBNP in the last 8760 hours. HbA1C: No results for input(s): HGBA1C in the last 72 hours. CBG: Recent Labs  Lab 06/16/18 1621  GLUCAP 91   Lipid Profile: No results for input(s): CHOL, HDL, LDLCALC, TRIG, CHOLHDL, LDLDIRECT in the last 72 hours. Thyroid Function Tests: No results for input(s): TSH, T4TOTAL, FREET4, T3FREE, THYROIDAB in the last 72 hours. Anemia Panel: No results for input(s): VITAMINB12, FOLATE, FERRITIN, TIBC, IRON, RETICCTPCT in the last 72 hours. Urine analysis:    Component Value Date/Time   COLORURINE YELLOW 09/23/2015 2210   APPEARANCEUR CLOUDY (A) 09/23/2015 2210   LABSPEC 1.017 09/23/2015 2210   PHURINE 6.5 09/23/2015 2210   GLUCOSEU NEGATIVE 09/23/2015 2210   GLUCOSEU NEGATIVE 01/19/2012 1526   HGBUR SMALL (A) 09/23/2015 2210   BILIRUBINUR NEGATIVE 09/23/2015 2210   KETONESUR 15 (A) 09/23/2015 2210   PROTEINUR NEGATIVE 09/23/2015 2210   UROBILINOGEN 0.2 01/19/2012 1526   NITRITE POSITIVE (A) 09/23/2015 2210   LEUKOCYTESUR MODERATE (A) 09/23/2015 2210   Sepsis Labs: @LABRCNTIP (procalcitonin:4,lacticidven:4)  )No results found for this or any previous visit (from the past 240 hour(s)).    Studies: No results found.  Scheduled Meds: . brimonidine  1 drop Right Eye BID  . carvedilol   25 mg Oral BID WC  . pantoprazole  40 mg Oral Daily  . polyvinyl alcohol  1 drop Both Eyes BID  . Rivaroxaban  15 mg Oral Q supper  . sodium chloride flush  3 mL Intravenous Q12H  . timolol  1 drop Right Eye BID  . vitamin B-12  1,000 mcg Oral BID    Continuous Infusions: . sodium chloride    . sodium chloride 50 mL/hr at 06/22/18 0919     LOS: 5 days     Kayleen Memos, MD [facility] Pager [phone]  If 7PM-7AM, please contact night-coverage [email or web address] Password [facility] 06/22/2018, 6:41 PM

## 2018-06-22 NOTE — Consult Note (Signed)
            [facility] CM Primary Care Navigator  06/22/2018  [name] 08/02/1922 [id number]   Went to see patientat the bedsideto identify possible discharge needs.  Patientreports that she "had difficulty breathing , coughing and swelling to both legs" that had led to this admission. (atrial fibrillation, cardiomegaly with mild interstitial edema and small bilateral pleural effusions, acute on chronic diastolic congestive heart failure, hypertensive urgency, pernicious anemia)  PatientendorsesDr.Jose Paz with [employer] [facility] [employer] as herprimary care provider.   [facility] [place] [facility] (prescriptions) to obtain medications without difficulty so far, but will probably change at the start of the year.  Shereports managing herown medications at home using "pill box" system filled once a week.  Patient reports that she has been driving prior to admission but son [name]) will be able to provide transportation toherdoctors' appointments post discharge.  Patient lives at home alone but his son will be able to assist with care when needed. She also mentioned having a housekeeper that comes to her house who can also assist if needed.   Anticipated discharge plan ishomewith home health services per therapy recommendation.  Patientvoiced understanding to call primary care provider's officeoncedischarge, for a post discharge follow-up visitwithin1- 2 weeksor sooner if needs arise.Patient letter (with PCP's contact number) was provided asareminder.  Explained topatientregardingTHN CM services available for health managementand resourcesat homeand she had indicatedbeing well- informed and aware of managing her health conditions at home. Patientreports having a weighing scale and regularly monitoring/ recording weight, stayingasactive as possible, triesto follow diet  restrictions by trying to decrease her salt intake, takes medications as ordered and follows-up with providers when needed.Patient also checks blood pressure regularly. She mentioned being [facility] with Dr. Haroldine Laws.  Patientexpressed understanding to seekreferral to [facility] care managementfrom primary care provider if deemed necessary and appropriate forfurther servicesin the near future.   [facility] care management information provided for future needs that she may have.  Noted order for [facility] EMMI HF calls already in place to follow-up patient's recovery after discharge and made patient aware of it.   For additional questions please contact:  [name], BSN, RN-BC [facility] PRIMARY CARE Navigator Cell: [phone]

## 2018-06-23 ENCOUNTER — Inpatient Hospital Stay (HOSPITAL_COMMUNITY): Payer: Medicare Other

## 2018-06-23 LAB — BASIC METABOLIC PANEL
Anion gap: 9 (ref 5–15)
BUN: 38 mg/dL — ABNORMAL HIGH (ref 8–23)
CHLORIDE: 102 mmol/L (ref 98–111)
CO2: 27 mmol/L (ref 22–32)
Calcium: 9.1 mg/dL (ref 8.9–10.3)
Creatinine, Ser: 2.39 mg/dL — ABNORMAL HIGH (ref 0.44–1.00)
GFR calc non Af Amer: 17 mL/min — ABNORMAL LOW (ref >60)
GFR, EST AFRICAN AMERICAN: 20 mL/min — AB (ref >60)
Glucose, Bld: 114 mg/dL — ABNORMAL HIGH (ref 70–99)
Potassium: 4.7 mmol/L (ref 3.5–5.1)
Sodium: 138 mmol/L (ref 135–145)

## 2018-06-23 LAB — BRAIN NATRIURETIC PEPTIDE: B Natriuretic Peptide: 187.7 pg/mL — ABNORMAL HIGH (ref 0.0–100.0)

## 2018-06-23 MED ORDER — SODIUM CHLORIDE 0.9 % IV SOLN
INTRAVENOUS | Status: AC
  Administered 2018-06-23: 10:00:00 via INTRAVENOUS

## 2018-06-23 NOTE — Progress Notes (Signed)
PROGRESS NOTE  [name] [id number]:[id number] DOB: 18-Apr-1923 DOA: 06/16/2018 PCP: Colon Branch, MD  HPI/Recap of past 24 hours:  84 y.o.femalewith medical history significant forcoronary artery disease, atrial fibrillation on Xarelto, hypertension, chronic diastolic CHF, chronic kidney disease stage III, ITP, pernicious anemia, and sick sinus syndrome with pacer, now presenting to the emergency department for evaluation of progressive shortness of breath, nausea, swelling, and mild intermittent chest discomfort over the past several days. She reports a mild cough, denies fevers or chills, reports her dyspnea is worse with exertion, and describes her chest discomfort as mild, intermittent, and without any definite alleviating or exacerbating factors identified. She reports consistent adherence with her antihypertensives, diuretic, and Xarelto. Denies any melena or hematochezia.  06/23/2018: Patient seen and examined at bedside.  No acute events overnight.  Renal function worsening.  She has no new complaints.  Lasix and benazepril on hold due to AKI.  Gentle IV fluid hydration.    Assessment/Plan: Principal Problem:   Acute on chronic diastolic CHF (congestive heart failure) (HCC) Active Problems:   *ANEMIA, PERNICIOUS   Thrombocytopenia (HCC)   *Essential hypertension   Chest pain   *CKD (chronic kidney disease) stage 3, GFR 30-59 ml/min   Coronary artery disease   Hypokalemia   Hypertensive urgency  Acute on chronic diastolic CHF Continue cardiac medications as recommended by cardiology Last 2D echo done revealed normal LVEF  Cardiology following  AKI on CKD 3, worsening Baseline creatinine appears to be 1.3 with GFR of 35 Creatinine continues to worsen 2.39 from 2.14  Continue to hold off Lasix and benazepril Continue gentle IV fluid hydration Repeat BMP in the morning Avoid nephrotoxic agents Obtain renal ultrasound  Chronic A. fib Rate controlled on  metoprolol Continue Xarelto for CVA prophylaxis  Hypotension with recent uncontrolled hypertension Blood pressures are currently soft Currently on carvedilol 25 mg twice daily Defer to cardiology to adjust cardiac medications  Ambulatory dysfunction/physical debility PT assessed and recommended home health PT Fall precaution Continue PT   DVT prophylaxis: Xarelto Code Status: DNR Family Communication: Pt in room, family not at bedside Disposition Plan:  Possible DC to home with home health PT tomorrow 06/24/2018 once renal function is improving and cardiology signs off.  Consultants:   Cardiology  Procedures:      Objective: Vitals:   06/22/18 2202 06/23/18 0504 06/23/18 0941 06/23/18 1231  BP: (!) 90/55 (!) 111/57 113/74 (!) 88/53  Pulse: 74 76 86 64  Resp:  18  17  Temp:  98.2 F (36.8 C)  (!) 97.5 F (36.4 C)  TempSrc:  Oral  Oral  SpO2:  93% 100% 97%  Weight:  72.5 kg    Height:        Intake/Output Summary (Last 24 hours) at 06/23/2018 1418 Last data filed at 06/23/2018 1000 Gross per 24 hour  Intake 1621.1 ml  Output 500 ml  Net 1121.1 ml   Filed Weights   06/21/18 0528 06/22/18 0452 06/23/18 0504  Weight: 72.1 kg 72.3 kg 72.5 kg    Exam:  . General: 84 y.o. year-old female well-developed well-nourished in no acute distress.  Alert and interactive.   . Cardiovascular: Regular rate and rhythm with no rubs or gallops.  No JVD or thyromegaly noted. Marland Kitchen Respiratory: Mild rales at bases.  Good inspiratory effort.  No wheezes noted. . Abdomen: Soft nontender nondistended with normal bowel sounds x4 quadrants. . Musculoskeletal: No lower extremity edema. 2/4 pulses in all 4 extremities. . Skin:  No ulcerative lesions noted or rashes, . Psychiatry: Mood is appropriate for condition and setting   Data Reviewed: CBC: Recent Labs  Lab 06/16/18 1608  WBC 4.3  HGB 10.8*  HCT 34.9*  MCV 97.8  PLT 61*   Basic Metabolic Panel: Recent Labs  Lab  06/17/18 0457  06/19/18 0518 06/20/18 0541 06/20/18 1043 06/21/18 0510 06/22/18 0526 06/23/18 0420  NA 143   < > 141 141  --  139 138 138  K 3.0*   < > 4.6 5.5* 5.2* 4.9 4.6 4.7  CL 103   < > 101 101  --  98 99 102  CO2 28   < > 30 32  --  32 27 27  GLUCOSE 106*   < > 111* 112*  --  99 99 114*  BUN 14   < > 16 21  --  30* 34* 38*  CREATININE 1.12*   < > 1.32* 1.78*  --  2.08* 2.14* 2.39*  CALCIUM 8.7*   < > 9.1 9.2  --  9.4 9.4 9.1  MG 1.9  --   --   --   --   --   --   --    < > = values in this interval not displayed.   GFR: Estimated Creatinine Clearance: 14.3 mL/min (A) (by C-G formula based on SCr of 2.39 mg/dL (H)). Liver Function Tests: No results for input(s): AST, ALT, ALKPHOS, BILITOT, PROT, ALBUMIN in the last 168 hours. No results for input(s): LIPASE, AMYLASE in the last 168 hours. No results for input(s): AMMONIA in the last 168 hours. Coagulation Profile: No results for input(s): INR, PROTIME in the last 168 hours. Cardiac Enzymes: Recent Labs  Lab 06/17/18 0457  TROPONINI <0.03   BNP (last 3 results) No results for input(s): PROBNP in the last 8760 hours. HbA1C: No results for input(s): HGBA1C in the last 72 hours. CBG: Recent Labs  Lab 06/16/18 1621  GLUCAP 91   Lipid Profile: No results for input(s): CHOL, HDL, LDLCALC, TRIG, CHOLHDL, LDLDIRECT in the last 72 hours. Thyroid Function Tests: No results for input(s): TSH, T4TOTAL, FREET4, T3FREE, THYROIDAB in the last 72 hours. Anemia Panel: No results for input(s): VITAMINB12, FOLATE, FERRITIN, TIBC, IRON, RETICCTPCT in the last 72 hours. Urine analysis:    Component Value Date/Time   COLORURINE YELLOW 09/23/2015 2210   APPEARANCEUR CLOUDY (A) 09/23/2015 2210   LABSPEC 1.017 09/23/2015 2210   PHURINE 6.5 09/23/2015 2210   GLUCOSEU NEGATIVE 09/23/2015 2210   GLUCOSEU NEGATIVE 01/19/2012 1526   HGBUR SMALL (A) 09/23/2015 2210   BILIRUBINUR NEGATIVE 09/23/2015 2210   KETONESUR 15 (A) 09/23/2015  2210   PROTEINUR NEGATIVE 09/23/2015 2210   UROBILINOGEN 0.2 01/19/2012 1526   NITRITE POSITIVE (A) 09/23/2015 2210   LEUKOCYTESUR MODERATE (A) 09/23/2015 2210   Sepsis Labs: @LABRCNTIP (procalcitonin:4,lacticidven:4)  )No results found for this or any previous visit (from the past 240 hour(s)).    Studies: No results found.  Scheduled Meds: . brimonidine  1 drop Right Eye BID  . carvedilol  25 mg Oral BID WC  . pantoprazole  40 mg Oral Daily  . polyvinyl alcohol  1 drop Both Eyes BID  . Rivaroxaban  15 mg Oral Q supper  . sodium chloride flush  3 mL Intravenous Q12H  . timolol  1 drop Right Eye BID  . vitamin B-12  1,000 mcg Oral BID    Continuous Infusions: . sodium chloride    . sodium chloride 50  mL/hr at 06/23/18 0947     LOS: 6 days     Kayleen Memos, MD [facility] Pager [phone]  If 7PM-7AM, please contact night-coverage [email or web address] Password [email or web address] 06/23/2018, 2:18 PM

## 2018-06-23 NOTE — Progress Notes (Signed)
Progress Note  Patient Name: [name] Date of Encounter: 06/23/2018  Primary Cardiologist: Glori Bickers, MD (Advanced heart Failure clinic) EP: Dr. Lovena Le  Subjective   No chest pain or dyspnea  Inpatient Medications    Scheduled Meds: . brimonidine  1 drop Right Eye BID  . carvedilol  25 mg Oral BID WC  . pantoprazole  40 mg Oral Daily  . polyvinyl alcohol  1 drop Both Eyes BID  . Rivaroxaban  15 mg Oral Q supper  . sodium chloride flush  3 mL Intravenous Q12H  . timolol  1 drop Right Eye BID  . vitamin B-12  1,000 mcg Oral BID   Continuous Infusions: . sodium chloride     PRN Meds: sodium chloride, acetaminophen, diphenhydrAMINE, hydrALAZINE, ondansetron (ZOFRAN) IV, sodium chloride flush, traZODone   Vital Signs    Vitals:   06/22/18 1742 06/22/18 1959 06/22/18 2202 06/23/18 0504  BP: (!) 103/57 (!) 83/42 (!) 90/55 (!) 111/57  Pulse: 70 77 74 76  Resp:  18  18  Temp:  98.2 F (36.8 C)  98.2 F (36.8 C)  TempSrc:  Oral  Oral  SpO2:  96%  93%  Weight:    72.5 kg  Height:        Intake/Output Summary (Last 24 hours) at 06/23/2018 0833 Last data filed at 06/23/2018 0512 Gross per 24 hour  Intake 1621.1 ml  Output 700 ml  Net 921.1 ml   Filed Weights   06/21/18 0528 06/22/18 0452 06/23/18 0504  Weight: 72.1 kg 72.3 kg 72.5 kg    Telemetry     Atrial fib-Personally Reviewed  ECG    No AM EKG- Personally Reviewed  Physical Exam   General: Well developed, well nourished, NAD  HEENT: OP clear, mucus membranes moist  SKIN: warm, dry. No rashes. Neuro: No focal deficits  Musculoskeletal: Muscle strength 5/5 all ext  Psychiatric: Mood and affect normal  Neck: No JVD, no carotid bruits, no thyromegaly, no lymphadenopathy.  Lungs:Clear bilaterally, no wheezes, rhonci, crackles Cardiovascular: Irreg irreg. No murmurs, gallops or rubs. Abdomen:Soft. Bowel sounds present. Non-tender.  Extremities: No lower extremity edema. Pulses are 2  + in the bilateral DP/PT.    Labs    Chemistry Recent Labs  Lab 06/21/18 0510 06/22/18 0526 06/23/18 0420  NA 139 138 138  K 4.9 4.6 4.7  CL 98 99 102  CO2 32 27 27  GLUCOSE 99 99 114*  BUN 30* 34* 38*  CREATININE 2.08* 2.14* 2.39*  CALCIUM 9.4 9.4 9.1  GFRNONAA 20* 19* 17*  GFRAA 23* 22* 20*  ANIONGAP 9 12 9      Hematology Recent Labs  Lab 06/16/18 1608  WBC 4.3  RBC 3.57*  HGB 10.8*  HCT 34.9*  MCV 97.8  MCH 30.3  MCHC 30.9  RDW 13.3  PLT 61*    Cardiac Enzymes Recent Labs  Lab 06/17/18 0457  TROPONINI <0.03    Recent Labs  Lab 06/16/18 1615  TROPIPOC 0.00     BNP Recent Labs  Lab 06/16/18 1608 06/23/18 0701  BNP 362.2* 187.7*     DDimer No results for input(s): DDIMER in the last 168 hours.   Radiology    No results found.  Cardiac Studies   Echocardiogram 06/17/18: Study Conclusions - Left ventricle: Systolic function was normal. The estimated ejection fraction was in the range of 55% to 60%. The study is not technically sufficient to allow evaluation of LV diastolic function. - Aortic valve:  Sclerosis without stenosis. There was trivial regurgitation. - Mitral valve: Calcified annulus. Mildly thickened leaflets . There was mild regurgitation. - Left atrium: The atrium was mildly dilated. - Right ventricle: Catheter in RV The cavity size was mildly dilated. - Right atrium: The atrium was mildly dilated. - Tricuspid valve: There was moderate regurgitation. - Pulmonary arteries: PA peak pressure: 60 mm Hg (S).  Patient Profile     84 y.o. female with history of diastolic CHF, CAD (25-49% stenosis of small caliber dOM2 on LHC in 2005), paroxysmal atrial fibrillation on xarelto, SSS s/p PPM, HTN, who was admitted with acute diastolic CHF.    Assessment & Plan    1. Acute on chronic diastolic CHF: She is negative 7.7 liters since admission. Due to worsened renal function, she was gently hydrated yesterday. Lasix and  Ace-inhibitor are on hold. Given continued worsening of renal function, would recommend continuing to monitor today with gentle hydration. Repeat BMET in am.     2. HTN: BP controlled. No changes  3. Persistent atrial fibrillation: Rate is controlled. Continue beta blocker.   For questions or updates, please contact [place] Please consult [email or web address] for contact info under   Signed, Lauree Chandler, MD  06/23/2018, 8:33 AM

## 2018-06-24 LAB — BASIC METABOLIC PANEL
ANION GAP: 11 (ref 5–15)
BUN: 35 mg/dL — ABNORMAL HIGH (ref 8–23)
CO2: 26 mmol/L (ref 22–32)
Calcium: 9.3 mg/dL (ref 8.9–10.3)
Chloride: 101 mmol/L (ref 98–111)
Creatinine, Ser: 2.04 mg/dL — ABNORMAL HIGH (ref 0.44–1.00)
GFR calc Af Amer: 24 mL/min — ABNORMAL LOW (ref >60)
GFR calc non Af Amer: 20 mL/min — ABNORMAL LOW (ref >60)
GLUCOSE: 98 mg/dL (ref 70–99)
Potassium: 4.5 mmol/L (ref 3.5–5.1)
Sodium: 138 mmol/L (ref 135–145)

## 2018-06-24 MED ORDER — CARVEDILOL 25 MG PO TABS: 25.0000 mg | ORAL_TABLET | Freq: Two times a day (BID) | ORAL | 0 refills | Status: DC

## 2018-06-24 NOTE — Discharge Summary (Addendum)
Discharge Summary  [name] TIR:[id number] DOB: Nov 06, 1922  PCP: Colon Branch, MD  Admit date: 06/16/2018 Discharge date: 06/24/2018  Time spent: 35 minutes  Recommendations for Outpatient Follow-up:  1. Follow-up with cardiology 2. Follow-up with your PCP 3. Take medications as prescribed 4. Continue physical therapy 5. Fall precautions  Discharge Diagnoses:  Active Hospital Problems   Diagnosis Date Noted  . Acute on chronic diastolic CHF (congestive heart failure) ([place]) 03/03/2012  . Hypertensive urgency   . Hypokalemia 06/16/2018  . Coronary artery disease 04/29/2016  . *CKD (chronic kidney disease) stage 3, GFR 30-59 ml/min 08/14/2013  . Chest pain 09/15/2010  . *ANEMIA, PERNICIOUS 05/22/2008  . *Essential hypertension 09/19/2007  . Thrombocytopenia ([place]) 08/19/2006    Resolved Hospital Problems  No resolved problems to display.    Discharge Condition: Stable  Diet recommendation: Resume previous diet.  Heart healthy diet.  Vitals:   06/23/18 2131 06/24/18 0527  BP: (!) 93/48 (!) 135/91  Pulse: 67 73  Resp:  18  Temp:  97.7 F (36.5 C)  SpO2:  96%    History of present illness:   84 y.o.femalewith medical history significant forcoronary artery disease, atrial fibrillation on Xarelto, hypertension, chronic diastolic CHF, chronic kidney disease stage III, ITP, pernicious anemia, and sick sinus syndrome with pacer, now presenting to the emergency department for evaluation of progressive shortness of breath, nausea, swelling, and mild intermittent chest discomfort over the past several days. She reports a mild cough, denies fevers or chills, reports her dyspnea is worse with exertion, and describes her chest discomfort as mild, intermittent, and without any definite alleviating or exacerbating factors identified. She reports consistent adherence with her antihypertensives, diuretic, and Xarelto. Denies any melena or hematochezia.  Hospital course  complicated by acute kidney injury and worsening of renal function.  P.o. Lasix and benazepril held by cardiology.  Was given gentle IV fluid hydration with improvement of her renal function.  She will need a repeat BMP next week 06/27/2018.  06/24/2018: Patient seen and examined at bedside.  No acute events overnight.  She has no new complaints.  She denies any chest pain, palpitations or dyspnea.  On the day of discharge, the patient was hemodynamically stable.  She will need to follow-up with her cardiologist and PCP post hospitalization.   Hospital Course:  Principal Problem:   Acute on chronic diastolic CHF (congestive heart failure) (HCC) Active Problems:   *ANEMIA, PERNICIOUS   Thrombocytopenia (HCC)   *Essential hypertension   Chest pain   *CKD (chronic kidney disease) stage 3, GFR 30-59 ml/min   Coronary artery disease   Hypokalemia   Hypertensive urgency  Acute on chronic diastolic CHF Continue cardiac medications as recommended by cardiology Last 2D echo done revealed normal LVEF  Cardiology will arrange follow-up outpatient  AKI on CKD 3, improving Suspect prerenal Baseline creatinine appears to be 1.3 with GFR of 35 Creatinine 2.04 from 2.39  Continue to hold off Lasix and benazepril Avoid dehydration  Chronic A. fib Rate controlled on metoprolol Continue Xarelto for primary CVA prophylaxis Follow-up with your cardiologist within a week  Hypotension with recent uncontrolled hypertension Blood pressures are currently soft Currently on carvedilol 25 mg twice daily Defer to cardiology to adjust cardiac medications  Ambulatory dysfunction/physical debility PT assessed and recommended home health PT Fall precaution Continue PT   DVT prophylaxis:Xarelto  Code Status:DNR   Consultants:  Cardiology  Procedures: None    Discharge Exam: BP (!) 135/91 (BP Location: Right Arm)  Pulse 73   Temp 97.7 F (36.5 C) (Oral)   Resp 18   Ht  5\' 4"  (1.626 m)   Wt 72.9 kg   SpO2 96%   BMI 27.58 kg/m  . General: 84 y.o. year-old female well developed well nourished in no acute distress.  Alert and oriented x3. . Cardiovascular: Irregular rate and rhythm with no rubs or gallops.  No thyromegaly or JVD noted.   Marland Kitchen Respiratory: Clear to auscultation with no wheezes or rales. Good inspiratory effort. . Abdomen: Soft nontender nondistended with normal bowel sounds x4 quadrants. . Musculoskeletal: No lower extremity edema. 2/4 pulses in all 4 extremities. Marland Kitchen Psychiatry: Mood is appropriate for condition and setting  Discharge Instructions You were cared for by a hospitalist during your hospital stay. If you have any questions about your discharge medications or the care you received while you were in the hospital after you are discharged, you can call the unit and asked to speak with the hospitalist on call if the hospitalist that took care of you is not available. Once you are discharged, your primary care physician will handle any further medical issues. Please note that NO REFILLS for any discharge medications will be authorized once you are discharged, as it is imperative that you return to your primary care physician (or establish a relationship with a primary care physician if you do not have one) for your aftercare needs so that they can reassess your need for medications and monitor your lab values.   Allergies as of 06/24/2018      Reactions   Morphine And Related Anaphylaxis, Nausea Only   Per family, made her violently ill   Oxycodone Other (See Comments)   Dizziness    Penicillins Swelling, Other (See Comments)   Joint edema Has patient had a PCN reaction causing immediate rash, facial/tongue/throat swelling, SOB or lightheadedness with hypotension: Yes Has patient had a PCN reaction causing severe rash involving mucus membranes or skin necrosis: No Has patient had a PCN reaction that required hospitalization: No Has patient  had a PCN reaction occurring within the last 10 years: No If all of the above answers are "NO", then may proceed with Cephalosporin use.   Clindamycin/lincomycin Hives, Itching   Colchicine Other (See Comments)   Hair loss   Hydroxychloroquine Other (See Comments)   Unknown reaction   Norvasc [amlodipine Besylate] Other (See Comments)   Reaction not recalled by the patient ??   Ofloxacin Other (See Comments)   Unknown   Streptomycin Other (See Comments)   "was a long time ago" reaction not recalled   Tape Other (See Comments)   SKIN IS VERY THIN AND WILL TEAR AND BRUISE EASILY!!   Tramadol Itching   Alendronate Sodium Palpitations   Amiodarone Rash, Other (See Comments)   Possible rash and nervousness per patient   Plaquenil [hydroxychloroquine Sulfate] Rash      Medication List    STOP taking these medications   acetaminophen 325 MG tablet Commonly known as:  TYLENOL   benazepril 20 MG tablet Commonly known as:  LOTENSIN   furosemide 40 MG tablet Commonly known as:  LASIX   ibuprofen 200 MG tablet Commonly known as:  ADVIL,MOTRIN     TAKE these medications   beta carotene w/minerals tablet Take 1 tablet by mouth daily.   brimonidine 0.2 % ophthalmic solution Commonly known as:  ALPHAGAN Place 1 drop into the right eye 2 (two) times daily.   CALCIUM 1200 PO Take 1,200  mg by mouth daily with breakfast.   carvedilol 25 MG tablet Commonly known as:  COREG Take 1 tablet (25 mg total) by mouth 2 (two) times daily with a meal. What changed:    medication strength  how much to take  when to take this  additional instructions   diphenhydrAMINE 25 MG tablet Commonly known as:  BENADRYL Take 25 mg by mouth at bedtime as needed for sleep (and sinus issues).   docusate sodium 100 MG capsule Commonly known as:  COLACE Take 100 mg by mouth daily as needed for mild constipation. Stool softener   omeprazole 20 MG capsule Commonly known as:  PRILOSEC Take 20 mg  by mouth daily.   OSTEO BI-FLEX ADV JOINT SHIELD Tabs Take 1 tablet by mouth daily.   REFRESH TEARS 0.5 % Soln Generic drug:  carboxymethylcellulose Place 1 drop into both eyes 2 (two) times daily.   Rivaroxaban 15 MG Tabs tablet Commonly known as:  XARELTO Take 1 tablet (15 mg total) by mouth daily with supper.   timolol 0.5 % ophthalmic solution Commonly known as:  TIMOPTIC Place 1 drop into the right eye 2 (two) times daily.   vitamin B-12 1000 MCG tablet Commonly known as:  CYANOCOBALAMIN Take 1,000 mcg by mouth 2 (two) times daily.   Vitamin D3 75 MCG (3000 UT) Tabs Take 3,000 Units by mouth daily.      Allergies  Allergen Reactions  . Morphine And Related Anaphylaxis and Nausea Only    Per family, made her violently ill  . Oxycodone Other (See Comments)    Dizziness   . Penicillins Swelling and Other (See Comments)    Joint edema Has patient had a PCN reaction causing immediate rash, facial/tongue/throat swelling, SOB or lightheadedness with hypotension: Yes Has patient had a PCN reaction causing severe rash involving mucus membranes or skin necrosis: No Has patient had a PCN reaction that required hospitalization: No Has patient had a PCN reaction occurring within the last 10 years: No If all of the above answers are "NO", then may proceed with Cephalosporin use.   . Clindamycin/Lincomycin Hives and Itching  . Colchicine Other (See Comments)    Hair loss  . Hydroxychloroquine Other (See Comments)    Unknown reaction  . Norvasc [Amlodipine Besylate] Other (See Comments)    Reaction not recalled by the patient ??  . Ofloxacin Other (See Comments)    Unknown   . Streptomycin Other (See Comments)    "was a long time ago" reaction not recalled  . Tape Other (See Comments)    SKIN IS VERY THIN AND WILL TEAR AND BRUISE EASILY!!  . Tramadol Itching  . Alendronate Sodium Palpitations  . Amiodarone Rash and Other (See Comments)    Possible rash and nervousness  per patient  . Plaquenil [Hydroxychloroquine Sulfate] Rash   Follow-up Information    [facility].   Specialty:  [facility] Why:  They will do your home health care at your home Contact information: [address] [place] [address] [phone]        Colon Branch, [place] on 06/27/2018.   Specialty:  Internal Medicine Why:  @10 :20am Contact information:  [id number] [phone]            The results of significant diagnostics from this hospitalization (including imaging, microbiology, ancillary and laboratory) are listed below for reference.    Significant Diagnostic Studies:  Dg Chest 2 View  Result Date: 06/16/2018 CLINICAL DATA:  Four days of chest pain, shortness of breath and bilateral lower extremity swelling. EXAM: CHEST - 2 VIEW COMPARISON:  07/20/2017 FINDINGS: Cardiac pacemaker in stable position. Enlarged cardiac silhouette. Calcific atherosclerotic disease and tortuosity of the aorta. Mild interstitial pulmonary edema. Small bilateral pleural effusions. Stable appearance of T12 vertebroplasty. Stable posttraumatic changes in the right chest wall. Soft tissues are grossly normal. IMPRESSION: Enlarged cardiac silhouette with mild interstitial pulmonary edema and small bilateral pleural effusions. Electronically Signed   By: Fidela Salisbury M.D.   On: 06/16/2018 15:42   US Renal  Result Date: 06/23/2018 CLINICAL DATA:  Initial evaluation for acute kidney injury EXAM: RENAL / URINARY TRACT ULTRASOUND COMPLETE COMPARISON:  Prior CT from 11/29/2013 FINDINGS: Right Kidney: Renal measurements: 9.1 x 3.5 x 4.1 cm = volume: 69.0 mL. Diffuse cortical thinning with increased echogenicity within the renal parenchyma, compatible with chronic medical renal disease. No mass or hydronephrosis visualized. Left Kidney: Renal measurements: 8.8 x 4.3 x 4.3 cm = volume: 83.7 mL. Diffuse cortical thinning with  increased echogenicity within the renal parenchyma, compatible with chronic medical renal disease. Lobulated renal contour noted. No mass or hydronephrosis visualized. Bladder: Appears normal for degree of bladder distention. IMPRESSION: 1. Diffuse cortical thinning with increased echogenicity within the renal parenchyma, compatible with chronic medical renal disease. 2. No hydronephrosis. Electronically Signed   By: Jeannine Boga M.D.   On: 06/23/2018 19:52    Microbiology: No results found for this or any previous visit (from the past 240 hour(s)).   Labs: Basic Metabolic Panel: Recent Labs  Lab 06/20/18 0541 06/20/18 1043 06/21/18 0510 06/22/18 0526 06/23/18 0420 06/24/18 0455  NA 141  --  139 138 138 138  K 5.5* 5.2* 4.9 4.6 4.7 4.5  CL 101  --  98 99 102 101  CO2 32  --  32 27 27 26   GLUCOSE 112*  --  99 99 114* 98  BUN 21  --  30* 34* 38* 35*  CREATININE 1.78*  --  2.08* 2.14* 2.39* 2.04*  CALCIUM 9.2  --  9.4 9.4 9.1 9.3   Liver Function Tests: No results for input(s): AST, ALT, ALKPHOS, BILITOT, PROT, ALBUMIN in the last 168 hours. No results for input(s): LIPASE, AMYLASE in the last 168 hours. No results for input(s): AMMONIA in the last 168 hours. CBC: No results for input(s): WBC, NEUTROABS, HGB, HCT, MCV, PLT in the last 168 hours. Cardiac Enzymes: No results for input(s): CKTOTAL, CKMB, CKMBINDEX, TROPONINI in the last 168 hours. BNP: BNP (last 3 results) Recent Labs    06/16/18 1608 06/23/18 0701  BNP 362.2* 187.7*    ProBNP (last 3 results) No results for input(s): PROBNP in the last 8760 hours.  CBG: No results for input(s): GLUCAP in the last 168 hours.     Signed:  Kayleen Memos, MD [facility] 06/24/2018, 11:14 AM

## 2018-06-24 NOTE — Progress Notes (Signed)
Pt has orders to be discharged. Discharge instructions given and pt has no additional questions at this time. Medication regimen reviewed and pt educated. Pt verbalized understanding and has no additional questions. Telemetry box removed. IV removed and site in good condition. Pt stable and waiting for transportation. 

## 2018-06-24 NOTE — Progress Notes (Signed)
Progress Note  Patient Name: [name] Date of Encounter: 06/24/2018  Primary Cardiologist: Glori Bickers, MD (Advanced heart Failure clinic) EP: Dr. Lovena Le  Subjective   No dyspnea or chest pain. Mild dizziness last night.   Inpatient Medications    Scheduled Meds: . brimonidine  1 drop Right Eye BID  . carvedilol  25 mg Oral BID WC  . pantoprazole  40 mg Oral Daily  . polyvinyl alcohol  1 drop Both Eyes BID  . Rivaroxaban  15 mg Oral Q supper  . sodium chloride flush  3 mL Intravenous Q12H  . timolol  1 drop Right Eye BID  . vitamin B-12  1,000 mcg Oral BID   Continuous Infusions: . sodium chloride     PRN Meds: sodium chloride, acetaminophen, diphenhydrAMINE, hydrALAZINE, ondansetron (ZOFRAN) IV, sodium chloride flush, traZODone   Vital Signs    Vitals:   06/23/18 1849 06/23/18 2126 06/23/18 2131 06/24/18 0527  BP: (!) 110/54 (!) 87/49 (!) 93/48 (!) 135/91  Pulse: 67 79 67 73  Resp:  20  18  Temp:  97.7 F (36.5 C)  97.7 F (36.5 C)  TempSrc:  Oral  Oral  SpO2:  96%  96%  Weight:    72.9 kg  Height:        Intake/Output Summary (Last 24 hours) at 06/24/2018 0930 Last data filed at 06/24/2018 2409 Gross per 24 hour  Intake 989.73 ml  Output 952 ml  Net 37.73 ml   Filed Weights   06/22/18 0452 06/23/18 0504 06/24/18 0527  Weight: 72.3 kg 72.5 kg 72.9 kg    Telemetry     Atrial fib-Personally Reviewed  ECG    No AM EKG- Personally Reviewed  Physical Exam   General: Well developed, well nourished, NAD  HEENT: OP clear, mucus membranes moist  SKIN: warm, dry. No rashes. Neuro: No focal deficits  Musculoskeletal: Muscle strength 5/5 all ext  Psychiatric: Mood and affect normal  Neck: No JVD, no carotid bruits, no thyromegaly, no lymphadenopathy.  Lungs:Clear bilaterally, no wheezes, rhonci, crackles Cardiovascular: Irreg irreg. No murmurs, gallops or rubs. Abdomen:Soft. Bowel sounds present. Non-tender.  Extremities: No lower  extremity edema. Pulses are 2 + in the bilateral DP/PT.   Labs    Chemistry Recent Labs  Lab 06/22/18 0526 06/23/18 0420 06/24/18 0455  NA 138 138 138  K 4.6 4.7 4.5  CL 99 102 101  CO2 27 27 26   GLUCOSE 99 114* 98  BUN 34* 38* 35*  CREATININE 2.14* 2.39* 2.04*  CALCIUM 9.4 9.1 9.3  GFRNONAA 19* 17* 20*  GFRAA 22* 20* 24*  ANIONGAP 12 9 11      Hematology No results for input(s): WBC, RBC, HGB, HCT, MCV, MCH, MCHC, RDW, PLT in the last 168 hours.  Cardiac Enzymes No results for input(s): TROPONINI in the last 168 hours.  No results for input(s): TROPIPOC in the last 168 hours.   BNP Recent Labs  Lab 06/23/18 0701  BNP 187.7*     DDimer No results for input(s): DDIMER in the last 168 hours.   Radiology    US Renal  Result Date: 06/23/2018 CLINICAL DATA:  Initial evaluation for acute kidney injury EXAM: RENAL / URINARY TRACT ULTRASOUND COMPLETE COMPARISON:  Prior CT from 11/29/2013 FINDINGS: Right Kidney: Renal measurements: 9.1 x 3.5 x 4.1 cm = volume: 69.0 mL. Diffuse cortical thinning with increased echogenicity within the renal parenchyma, compatible with chronic medical renal disease. No mass or hydronephrosis visualized. Left  Kidney: Renal measurements: 8.8 x 4.3 x 4.3 cm = volume: 83.7 mL. Diffuse cortical thinning with increased echogenicity within the renal parenchyma, compatible with chronic medical renal disease. Lobulated renal contour noted. No mass or hydronephrosis visualized. Bladder: Appears normal for degree of bladder distention. IMPRESSION: 1. Diffuse cortical thinning with increased echogenicity within the renal parenchyma, compatible with chronic medical renal disease. 2. No hydronephrosis. Electronically Signed   By: Jeannine Boga M.D.   On: 06/23/2018 19:52    Cardiac Studies   Echocardiogram 06/17/18: Study Conclusions - Left ventricle: Systolic function was normal. The estimated ejection fraction was in the range of 55% to 60%. The  study is not technically sufficient to allow evaluation of LV diastolic function. - Aortic valve: Sclerosis without stenosis. There was trivial regurgitation. - Mitral valve: Calcified annulus. Mildly thickened leaflets . There was mild regurgitation. - Left atrium: The atrium was mildly dilated. - Right ventricle: Catheter in RV The cavity size was mildly dilated. - Right atrium: The atrium was mildly dilated. - Tricuspid valve: There was moderate regurgitation. - Pulmonary arteries: PA peak pressure: 60 mm Hg (S).  Patient Profile     84 y.o. female with history of diastolic CHF, CAD (81-77% stenosis of small caliber dOM2 on LHC in 2005), paroxysmal atrial fibrillation on xarelto, SSS s/p PPM, HTN, who was admitted with acute diastolic CHF.    Assessment & Plan    1. Acute on chronic diastolic CHF: She is negative 7.7 liters since admission and with even I/O over last 24 hours. She is symptomatically improved. She has remained hospitalized due to worsened renal function with diuresis. Her creatinine is slowly improving with gentle hydration. Ace-inh on hold.  I would continue to hold her Ace-inh and Lasix until she is seen back in our office. She can be discharged home today. She will need close f/u next week in the advanced heart failure clinic with a BMET at that time. We will arrange this follow up appt.   2. HTN: BP is controlled today. No changes.   3. Persistent atrial fibrillation: Her heart rate is controlled. Continue beta blocker.   For questions or updates, please contact [place] Please consult [email or web address] for contact info under   Signed, Lauree Chandler, MD  06/24/2018, 9:30 AM

## 2018-06-24 NOTE — Care Management Important Message (Signed)
Important Message  Patient Details  Name: [name] MRN: [id number] Date of Birth: 1922-12-29   Medicare Important Message Given:  Yes    Evens Meno P Anthem Frazer 06/24/2018, 12:50 PM

## 2018-06-24 NOTE — Progress Notes (Signed)
[place] arranged with [facility] on 06/21/2018; Mindi Slicker [facility] [phone]

## 2018-06-27 ENCOUNTER — Telehealth: Payer: Self-pay

## 2018-06-27 ENCOUNTER — Encounter: Payer: Self-pay | Admitting: Physician Assistant

## 2018-06-27 ENCOUNTER — Inpatient Hospital Stay: Payer: Medicare Other | Admitting: Internal Medicine

## 2018-06-27 ENCOUNTER — Ambulatory Visit (INDEPENDENT_AMBULATORY_CARE_PROVIDER_SITE_OTHER): Payer: Medicare Other | Admitting: Physician Assistant

## 2018-06-27 VITALS — BP 130/86 | HR 95 | Ht 64.0 in | Wt 159.6 lb

## 2018-06-27 DIAGNOSIS — N183 Chronic kidney disease, stage 3 unspecified: Secondary | ICD-10-CM

## 2018-06-27 DIAGNOSIS — I10 Essential (primary) hypertension: Secondary | ICD-10-CM

## 2018-06-27 DIAGNOSIS — R5383 Other fatigue: Secondary | ICD-10-CM

## 2018-06-27 DIAGNOSIS — I4819 Other persistent atrial fibrillation: Secondary | ICD-10-CM | POA: Diagnosis not present

## 2018-06-27 DIAGNOSIS — I5033 Acute on chronic diastolic (congestive) heart failure: Secondary | ICD-10-CM | POA: Diagnosis not present

## 2018-06-27 MED ORDER — FUROSEMIDE 40 MG PO TABS: ORAL_TABLET | ORAL | 2 refills | Status: DC

## 2018-06-27 MED ORDER — CARVEDILOL 12.5 MG PO TABS: ORAL_TABLET | ORAL | 3 refills | Status: DC

## 2018-06-27 NOTE — Patient Instructions (Signed)
Medication Instructions:  START Lasix 40 mg for 3 days, then switch to as needed for weight gain of 3lbs in a day or 5lbs in a week. INCREASE Carvedilol to 1 1/2 tablet twice daily (18.75mg  total)  If you need a refill on your cardiac medications before your next appointment, please call your pharmacy.   Lab work: BMET on December 31st. If you have labs (blood work) drawn today and your tests are completely normal, you will receive your results only by: Marland Kitchen MyChart Message (if you have MyChart) OR . A paper copy in the mail If you have any lab test that is abnormal or we need to change your treatment, we will call you to review the results.  Testing/Procedures: None  Follow-Up: At [facility], you and your health needs are our priority.  As part of our continuing mission to provide you with exceptional heart care, we have created designated Provider Care Teams.  These Care Teams include your primary Cardiologist (physician) and Advanced Practice Providers (APPs -  Physician Assistants and Nurse Practitioners) who all work together to provide you with the care you need, when you need it. Marland Kitchen Keep follow up with Rosaria Ferries, PA-C.  Any Other Special Instructions Will Be Listed Below (If Applicable). Keep BP/Heart rate/Fatigue log.

## 2018-06-27 NOTE — Telephone Encounter (Signed)
Caolled patient no answer. Hospital follow up appointment has been scheduled. Just need to discuss hospitalization with patient.

## 2018-06-27 NOTE — Progress Notes (Addendum)
Cardiology Office Note   Date:  06/27/2018   ID:  [name], DOB 01/30/1923, MRN [id number]  PCP:  Colon Branch, MD Cardiologist:  Glori Bickers, MD 03/19/2017 Electrophysiologist: Dr Brayton Mars, PA-C    History of Present Illness: [name] is a 84 y.o. female with a history of D-CHF, Takotsubo CM 2005, CAD (70-80% stenosis of small caliber dOM2 on LHC in 2005), PAF on Xarelto, SSS s/p PPM, HTN  Admitted 12/5-12/13/2019 for acute on chronic diastolic CHF, CKD stage III, off ACE/Lasix until seen in the office, BMET at appointment, creatinine peak 2.39 with a GFR of 17, 2.04 at discharge with a GFR of 20, weight at discharge 72.9 kg  [name] presents for cardiology follow up. Her son is with her.   She has been following her weight at home. Her weight has been stable.  However, she has been waking with some LE edema, worse during the day. She denies PND, but may have some orthopnea.  Her breathing is better than when she went into the hospital but she still gets SOB w/ exertion. Not as bad as pre-admission.   She has such fatigue. Dr Lovena Le told her in June 2019 that she would probably be in Afib the rest of her life.   She is not aware of the Afib much. She gets a fluttering feeling sometimes, not all the time.    Past Medical History:  Diagnosis Date  . AV block, 1st degree   . CAD (coronary artery disease)    Non ST elevation 2005 ; LHC in 6/05 in setting of NSTEMI: ant apical AK and inf-apical AK, EF 29%, dOM2 70-80% (small); findings c/w Tako-Tsubo CM  . Cardiomyopathy, nonischemic ([place]) 04/2008   Likely Tako-Tsubo. Resolved. --dx'd on cath 2005. EF 29% with minimal distal CAD (small OM1 70-80).  Echo 10/09 EF 55%;  echo 01/26/12: EF 55%, mild LAE, PASP 34.  Marland Kitchen CHB (complete heart block) (HCC)    Intermittent, s/p Saint Jude PPM  . Colitis, ischemic ([place])   . Complication of anesthesia    post anesthesia excessive somnulence  .  Compression fracture of C-spine ([place]) 10/10/2011   Fell at home, tx at [facility]  . Diarrhea associated with pseudomembranous colitis 6/11-17/2013   [facility]  . Dyslipidemia   . Fall    with non-healing rib fractures  . Glaucoma   . Granuloma annulare   . History of phlebitis   . Hypertension    SEVERE  . Idiopathic thrombocytopenic purpura (ITP) (HCC)   . Lower extremity edema   . Lumbar stenosis 2004   DR. MARK ROY  . Multiple pelvic fractures 05/2012   Dr Adaline Sill , [facility]  . Osteoporosis     Past Surgical History:  Procedure Laterality Date  . ABDOMINAL HYSTERECTOMY    . APPENDECTOMY    . BACK SURGERY  10/2004   LS Disc 4-5 no sx.  Marland Kitchen CARDIAC CATHETERIZATION     12/2003  . CATARACT EXTRACTION, BILATERAL    . COLONOSCOPY  09/2005   adenoma  . COLONOSCOPY W/ POLYPECTOMY    . 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 PACEMAKER IMPLANT N/A 11/06/2016   Procedure: St Jude Pacemaker Implant;  Surgeon: Evans Lance, MD;  Location: [place] [facility];  Service: Cardiovascular;  Laterality: N/A;  . rectal bleed  05-01-06   colonoscopy-ischemic colitis  . ROTATOR CUFF REPAIR    . VERTEBROPLASTY  Dr Gladstone Lighter    Current Outpatient Medications  Medication Sig Dispense Refill  . beta carotene w/minerals (OCUVITE) tablet Take 1 tablet by mouth daily.     . brimonidine (ALPHAGAN) 0.2 % ophthalmic solution Place 1 drop into the right eye 2 (two) times daily.      . Calcium Carbonate-Vit D-Min (CALCIUM 1200 PO) Take 1,200 mg by mouth daily with breakfast.    . carboxymethylcellulose (REFRESH TEARS) 0.5 % SOLN Place 1 drop into both eyes 2 (two) times daily.     . carvedilol (COREG) 25 MG tablet Take 1 tablet (25 mg total) by mouth 2 (two) times daily with a meal. 30 tablet 0  . Cholecalciferol (VITAMIN D3) 3000 UNITS TABS Take 3,000 Units by mouth daily.     . diphenhydrAMINE (BENADRYL) 25 MG tablet Take 25 mg by mouth at bedtime as needed for sleep (and sinus issues).     .  docusate sodium (COLACE) 100 MG capsule Take 100 mg by mouth daily as needed for mild constipation. Stool softener     . Misc Natural Products (OSTEO BI-FLEX ADV JOINT SHIELD) TABS Take 1 tablet by mouth daily.     Marland Kitchen omeprazole (PRILOSEC) 20 MG capsule Take 20 mg by mouth daily.      . Rivaroxaban (XARELTO) 15 MG TABS tablet Take 1 tablet (15 mg total) by mouth daily with supper. 90 tablet 1  . timolol (TIMOPTIC) 0.5 % ophthalmic solution Place 1 drop into the right eye 2 (two) times daily.  5  . vitamin B-12 (CYANOCOBALAMIN) 1000 MCG tablet Take 1,000 mcg by mouth 2 (two) times daily.      No current facility-administered medications for this visit.     Allergies:   Morphine and related; Oxycodone; Penicillins; Clindamycin/lincomycin; Colchicine; Hydroxychloroquine; Norvasc [amlodipine besylate]; Ofloxacin; Streptomycin; Tape; Tramadol; Alendronate sodium; Amiodarone; and Plaquenil [hydroxychloroquine sulfate]    Social History:  The patient  reports that she has never smoked. She has never used smokeless tobacco. She reports that she does not drink alcohol or use drugs.   Family History:  The patient's family history includes Breast cancer in her sister and sister; Colon cancer in her father; Coronary artery disease in her mother; Heart failure in her mother; Kidney disease in her mother; Rectal cancer in her sister; Renal cancer in her mother; Stroke in her father.  She indicated that her mother is deceased. She indicated that her father is deceased. She indicated that two of her four sisters are deceased. She indicated that her maternal grandmother is deceased. She indicated that her maternal grandfather is deceased. She indicated that her paternal grandmother is deceased. She indicated that her paternal grandfather is deceased. She reported the following about one of her maternal aunts: colon cancer. She reported the following about her other maternal aunt of unknown status: colon cancer. She  indicated that the status of her neg hx is unknown.   ROS:  Please see the history of present illness. All other systems are reviewed and negative.    PHYSICAL EXAM: VS:  BP 130/86   Pulse 95   Ht 5\' 4"  (1.626 m)   Wt 159 lb 9.6 oz (72.4 kg)   BMI 27.40 kg/m  , BMI Body mass index is 27.4 kg/m. GEN: Well nourished, well developed, female in no acute distress HEENT: normal for age  Neck: JVD 10 cm, no carotid bruit, no masses Cardiac: Irreg R&R; soft murmur, no rubs, or gallops Respiratory: rales bases bilaterally, normal work of  breathing GI: soft, nontender, nondistended, + BS MS: no deformity or atrophy; 1-2+ edema; distal pulses are 2+ in all 4 extremities  Skin: warm and dry, no rash Neuro:  Strength and sensation are intact Psych: euthymic mood, full affect   EKG:  EKG is not ordered today.  ECHO: 06/17/2018 Echocardiogram12/6/19: Study Conclusions - Left ventricle: Systolic function was normal. The estimated ejection fraction was in the range of 55% to 60%. The study is not technically sufficient to allow evaluation of LV diastolic function. - Aortic valve: Sclerosis without stenosis. There was trivial regurgitation. - Mitral valve: Calcified annulus. Mildly thickened leaflets . There was mild regurgitation. - Left atrium: The atrium was mildly dilated. - Right ventricle: Catheter in RV The cavity size was mildly dilated. - Right atrium: The atrium was mildly dilated. - Tricuspid valve: There was moderate regurgitation. - Pulmonary arteries: PA peak pressure: 60 mm Hg (S).  Recent Labs: 06/16/2018: Hemoglobin 10.8; Platelets 61 06/17/2018: Magnesium 1.9 06/23/2018: B Natriuretic Peptide 187.7 06/24/2018: BUN 35; Creatinine, Ser 2.04; Potassium 4.5; Sodium 138  CBC    Component Value Date/Time   WBC 4.3 06/16/2018 1608   RBC 3.57 (L) 06/16/2018 1608   HGB 10.8 (L) 06/16/2018 1608   HGB 11.2 (L) 11/05/2014 1525   HCT 34.9 (L) 06/16/2018 1608    HCT 33.2 (L) 11/05/2014 1525   PLT 61 (L) 06/16/2018 1608   PLT 74 (L) 11/05/2014 1525   MCV 97.8 06/16/2018 1608   MCV 96.0 11/05/2014 1525   MCH 30.3 06/16/2018 1608   MCHC 30.9 06/16/2018 1608   RDW 13.3 06/16/2018 1608   RDW 14.0 11/05/2014 1525   LYMPHSABS 1.6 05/02/2018 1439   LYMPHSABS 1.5 11/05/2014 1525   MONOABS 0.4 05/02/2018 1439   MONOABS 0.5 11/05/2014 1525   EOSABS 0.2 05/02/2018 1439   EOSABS 0.6 (H) 11/05/2014 1525   BASOSABS 0.0 05/02/2018 1439   BASOSABS 0.0 11/05/2014 1525   CMP Latest Ref Rng & Units 06/24/2018 06/23/2018 06/22/2018  Glucose 70 - 99 mg/dL 98 114(H) 99  BUN 8 - 23 mg/dL 35(H) 38(H) 34(H)  Creatinine 0.44 - 1.00 mg/dL 2.04(H) 2.39(H) 2.14(H)  Sodium 135 - 145 mmol/L 138 138 138  Potassium 3.5 - 5.1 mmol/L 4.5 4.7 4.6  Chloride 98 - 111 mmol/L 101 102 99  CO2 22 - 32 mmol/L 26 27 27   Calcium 8.9 - 10.3 mg/dL 9.3 9.1 9.4  Total Protein 6.0 - 8.3 g/dL - - -  Total Bilirubin 0.2 - 1.2 mg/dL - - -  Alkaline Phos 39 - 117 U/L - - -  AST 0 - 37 U/L - - -  ALT 0 - 35 U/L - - -     Lipid Panel Lab Results  Component Value Date   CHOL  12/14/2007    112        ATP III CLASSIFICATION:  <200     mg/dL   Desirable  200-239  mg/dL   Borderline High  >=240    mg/dL   High   HDL 24 (L) 12/14/2007   LDLCALC  12/14/2007    69        Total Cholesterol/HDL:CHD Risk Coronary Heart Disease Risk Table                     Men   Women  1/2 Average Risk   3.4   3.3   TRIG 97 12/14/2007   CHOLHDL 4.7 12/14/2007      Wt  Readings from Last 3 Encounters:  06/27/18 159 lb 9.6 oz (72.4 kg)  06/24/18 160 lb 11.2 oz (72.9 kg)  05/02/18 166 lb 8 oz (75.5 kg)     Other studies Reviewed: Additional studies/ records that were reviewed today include: office notes, hospital records and testing.  ASSESSMENT AND PLAN:  1.  Acute on chronic diastolic CHF:  -Although her weight is not up, she has volume overload by exam. - Her Lasix was held upon discharge  from the hospital because of worsening renal function - I will give her Lasix 40 mg a day for 3 days only.  She is then to take it as needed for weight gain of 3 pounds in a day or 5 pounds in a week - She has also been using salt at home, she is encouraged to discontinue this. -If she is compliant with a low-sodium diet, she may not need daily Lasix. -Early follow-up in the office, appointment scheduled December 31 -Reassess volume status and check labs at that time.  2.  CKD, stage III-4: -Creatinine 2.04 at discharge on 12/13. -It is too early to check it now, check it at follow-up.  3.  Long-term persistent atrial fibrillation: - When asked to rate her fatigue on a 1-5 scale, she gives it a 2. -That is a livable level. -It may be due to the atrial fibrillation, but that is difficult to ascertain because she is unlikely to maintain sinus rhythm.  Therefore, Dr. Lovena Le has made the decision to pursue rate control and anticoagulation. -Rate control is essential.  She was supposed to be taking the carvedilol at 25 mg daily, but was concerned about it.  She has been taking 12.5 mg twice daily.  Her heart rate is almost 100 at rest. - She will try taking 1-1/2 of her carvedilol 12.5 mg tablets twice daily.  If her fatigue stays the same, and she needs still more heart rate control, we can go up to 25 mg twice daily at the next visit.  4.  Fatigue: Her TSH was not checked during this past admission, but it has always been normal.  Her hemoglobin A1c has never been elevated.  She has been anemic, with a hemoglobin between 10 and 12.  Recheck with labs next visit.  If her hemoglobin is significantly lower, there may be a bleeding issues but this would also contribute to her fatigue.  5.  Hypertension: Her blood pressure is at goal today.  We will have to watch it on the Lasix.  She was advised that the Benicar is not to be restarted.   Current medicines are reviewed at length with the patient  today.  The patient has concerns regarding medicines.  The following changes have been made: Restart Lasix  Labs/ tests ordered today include:  No orders of the defined types were placed in this encounter.    Disposition:   FU with Glori Bickers, MD and Dr. Lovena Le.  Signed, Rosaria Ferries, PA-C  06/27/2018 2:21 PM    [place] Phone: [phone]; Fax: [phone]

## 2018-06-28 ENCOUNTER — Telehealth: Payer: Self-pay | Admitting: Internal Medicine

## 2018-06-28 NOTE — Telephone Encounter (Signed)
Copied from [place] [id number]. Topic: Quick Communication - Home Health Verbal Orders >> Jun 28, 2018  2:53 PM Rutherford Nail, [place] wrote: Caller/Agency: [facility] Number: [phone] (Can leave a voicemail) Requesting OT/PT/Skilled Nursing/Social Work: Skilled Nursing Frequency:  2x a week for 4 weeks 1x a week for 4 weeks  Requesting Physical therapy evaluation also for poor endurance after recent hospitalization.

## 2018-06-28 NOTE — Telephone Encounter (Signed)
Spoke w/ Carrie, verbal orders given.  

## 2018-06-29 ENCOUNTER — Telehealth: Payer: Self-pay

## 2018-06-29 ENCOUNTER — Ambulatory Visit (INDEPENDENT_AMBULATORY_CARE_PROVIDER_SITE_OTHER): Payer: Medicare Other | Admitting: Internal Medicine

## 2018-06-29 ENCOUNTER — Encounter: Payer: Self-pay | Admitting: Internal Medicine

## 2018-06-29 VITALS — BP 126/68 | HR 68 | Temp 97.9°F | Resp 16 | Ht 64.0 in | Wt 158.4 lb

## 2018-06-29 DIAGNOSIS — I5033 Acute on chronic diastolic (congestive) heart failure: Secondary | ICD-10-CM

## 2018-06-29 DIAGNOSIS — I110 Hypertensive heart disease with heart failure: Secondary | ICD-10-CM

## 2018-06-29 DIAGNOSIS — R5383 Other fatigue: Secondary | ICD-10-CM | POA: Diagnosis not present

## 2018-06-29 NOTE — Progress Notes (Signed)
Pre visit review using our clinic review tool, if applicable. No additional management support is needed unless otherwise documented below in the visit note. 

## 2018-06-29 NOTE — Patient Instructions (Addendum)
GO TO THE LAB : Get the blood work     [place]  (has a follow-up 08/2018)  Take Lasix 40 mg tomorrow and then as needed if you gains 3 pounds in a day or 5 pounds in a week. Get your blood work are cardiology on December 31

## 2018-06-29 NOTE — Telephone Encounter (Signed)
thx

## 2018-06-29 NOTE — Progress Notes (Signed)
Subjective:    Patient ID: [name], female    DOB: 01/01/1923, 84 y.o.   MRN: [id number]  DOS:  06/29/2018 Type of visit - description : TCM 7 Patient was admitted to the hospital on discharge 06/24/2018 She presented with shortness of breath, nausea,   chest discomfort. She was diagnosed with the following: Acute on chronic CHF, seen by cardiology, she diuresed 7.7 L since admission, creatinine went up, BPs were soft, they recommended to hold ACE inhibitor and Lasix until she is seen by cardiology as an outpatient.  She actually saw cardiology a couple of days ago, she was recommended to go back on Lasix for 3 days.   Review of Systems Overall  feeling better. No chest pain. Still get short of breath sometimes and is very fatigued with almost any activity. Denies fever chills, occasional cough. Ambulatory weights & BPs reviewed.  Past Medical History:  Diagnosis Date  . AV block, 1st degree   . CAD (coronary artery disease)    Non ST elevation 2005 ; LHC in 6/05 in setting of NSTEMI: ant apical AK and inf-apical AK, EF 29%, dOM2 70-80% (small); findings c/w Tako-Tsubo CM  . Cardiomyopathy, nonischemic ([place]) 04/2008   Likely Tako-Tsubo. Resolved. --dx'd on cath 2005. EF 29% with minimal distal CAD (small OM1 70-80).  Echo 10/09 EF 55%;  echo 01/26/12: EF 55%, mild LAE, PASP 34.  Marland Kitchen CHB (complete heart block) (HCC)    Intermittent, s/p Saint Jude PPM  . Colitis, ischemic ([place])   . Complication of anesthesia    post anesthesia excessive somnulence  . Compression fracture of C-spine ([place]) 10/10/2011   Fell at home, tx at [facility]  . Diarrhea associated with pseudomembranous colitis 6/11-17/2013   [facility]  . Dyslipidemia   . Fall    with non-healing rib fractures  . Glaucoma   . Granuloma annulare   . History of phlebitis   . Hypertension    SEVERE  . Idiopathic thrombocytopenic purpura (ITP) (HCC)   . Lower extremity edema   . Lumbar stenosis 2004   DR. MARK  ROY  . Multiple pelvic fractures 05/2012   Dr Adaline Sill , [facility]  . Osteoporosis     Past Surgical History:  Procedure Laterality Date  . ABDOMINAL HYSTERECTOMY    . APPENDECTOMY    . BACK SURGERY  10/2004   LS Disc 4-5 no sx.  Marland Kitchen CARDIAC CATHETERIZATION     12/2003  . CATARACT EXTRACTION, BILATERAL    . COLONOSCOPY  09/2005   adenoma  . COLONOSCOPY W/ POLYPECTOMY    . 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 PACEMAKER IMPLANT N/A 11/06/2016   Procedure: St Jude Pacemaker Implant;  Surgeon: Evans Lance, MD;  Location: [place] [facility];  Service: Cardiovascular;  Laterality: N/A;  . rectal bleed  05-01-06   colonoscopy-ischemic colitis  . ROTATOR CUFF REPAIR    . VERTEBROPLASTY     Dr Gladstone Lighter    Social History   Socioeconomic History  . Marital status: Widowed    Spouse name: Not on file  . Number of children: 1  . Years of education: Not on file  . Highest education level: Not on file  Occupational History  . Occupation: retired    Fish farm manager: RETIRED  Social Needs  . Financial resource strain: Not on file  . Food insecurity:    Worry: Not on file    Inability: Not on file  . Transportation needs:  Medical: Not on file    Non-medical: Not on file  Tobacco Use  . Smoking status: Never Smoker  . Smokeless tobacco: Never Used  Substance and Sexual Activity  . Alcohol use: No  . Drug use: No  . Sexual activity: Not Currently  Lifestyle  . Physical activity:    Days per week: Not on file    Minutes per session: Not on file  . Stress: Not on file  Relationships  . Social connections:    Talks on phone: Not on file    Gets together: Not on file    Attends religious service: Not on file    Active member of club or organization: Not on file    Attends meetings of clubs or organizations: Not on file    Relationship status: Not on file  . Intimate partner violence:    Fear of current or ex partner: Not on file    Emotionally abused: Not on file     Physically abused: Not on file    Forced sexual activity: Not on file  Other Topics Concern  . Not on file  Social History Narrative   Widowed   Lives by herself in a town house, drives    Son Gailya Tauer       Allergies as of 06/29/2018      Reactions   Morphine And Related Anaphylaxis, Nausea Only   Per family, made her violently ill   Oxycodone Other (See Comments)   Dizziness    Penicillins Swelling, Other (See Comments)   Joint edema Has patient had a PCN reaction causing immediate rash, facial/tongue/throat swelling, SOB or lightheadedness with hypotension: Yes Has patient had a PCN reaction causing severe rash involving mucus membranes or skin necrosis: No Has patient had a PCN reaction that required hospitalization: No Has patient had a PCN reaction occurring within the last 10 years: No If all of the above answers are "NO", then may proceed with Cephalosporin use.   Clindamycin/lincomycin Hives, Itching   Colchicine Other (See Comments)   Hair loss   Hydroxychloroquine Other (See Comments)   Unknown reaction   Norvasc [amlodipine Besylate] Other (See Comments)   Reaction not recalled by the patient ??   Ofloxacin Other (See Comments)   Unknown   Streptomycin Other (See Comments)   "was a long time ago" reaction not recalled   Tape Other (See Comments)   SKIN IS VERY THIN AND WILL TEAR AND BRUISE EASILY!!   Tramadol Itching   Alendronate Sodium Palpitations   Amiodarone Rash, Other (See Comments)   Possible rash and nervousness per patient   Plaquenil [hydroxychloroquine Sulfate] Rash      Medication List       Accurate as of June 29, 2018 11:59 PM. Always use your most recent med list.        beta carotene w/minerals tablet Take 1 tablet by mouth daily.   brimonidine 0.2 % ophthalmic solution Commonly known as:  ALPHAGAN Place 1 drop into the right eye 2 (two) times daily.   CALCIUM 1200 PO Take 1,200 mg by mouth daily with breakfast.     carvedilol 12.5 MG tablet Commonly known as:  COREG Take 1 1/2 tablet twice daily (18.75 mg total)   diphenhydrAMINE 25 MG tablet Commonly known as:  BENADRYL Take 25 mg by mouth at bedtime as needed for sleep (and sinus issues).   docusate sodium 100 MG capsule Commonly known as:  COLACE Take 100 mg by mouth daily  as needed for mild constipation. Stool softener   furosemide 40 MG tablet Commonly known as:  LASIX Take 40 mg for 3 days, then as needed.   omeprazole 20 MG capsule Commonly known as:  PRILOSEC Take 20 mg by mouth daily.   OSTEO BI-FLEX ADV JOINT SHIELD Tabs Take 1 tablet by mouth daily.   REFRESH TEARS 0.5 % Soln Generic drug:  carboxymethylcellulose Place 1 drop into both eyes 2 (two) times daily.   Rivaroxaban 15 MG Tabs tablet Commonly known as:  XARELTO Take 1 tablet (15 mg total) by mouth daily with supper.   timolol 0.5 % ophthalmic solution Commonly known as:  TIMOPTIC Place 1 drop into the right eye 2 (two) times daily.   vitamin B-12 1000 MCG tablet Commonly known as:  CYANOCOBALAMIN Take 1,000 mcg by mouth 2 (two) times daily.   Vitamin D3 75 MCG (3000 UT) Tabs Take 3,000 Units by mouth daily.           Objective:   Physical Exam BP 126/68 (BP Location: Left Arm, Patient Position: Sitting, Cuff Size: Small)   Pulse 68   Temp 97.9 F (36.6 C) (Oral)   Resp 16   Ht _0  (1.626 m)   Wt 158 lb 6 oz (71.8 kg)   SpO2 96%   BMI 27.19 kg/m   General:   Well developed, NAD, BMI noted. HEENT:  Normocephalic . Face symmetric, atraumatic Lungs:  Few crackles at both bases Normal respiratory effort, no intercostal retractions, no accessory muscle use. Heart: Slightly irregular,  no murmur.  No pretibial edema bilaterally  Skin: Not pale. Not jaundice Neurologic:  alert & oriented X3.  Speech normal, gait appropriate for age and unassisted Psych--  Cognition and judgment appear intact.  Cooperative with normal attention span and  concentration.  Behavior appropriate. No anxious or depressed appearing.      Assessment & Plan:    Assessment   HTN severe CKD: creat ~1.4, 1.5 Dyslipidemia Hematology: --ITP-- hematology visit for 11-05-14, released to the care of PCP , check CBCs every 4 months, consult hematology if platelets <50 K --Pernicious anemia --Anemia of chronic disease CV: --CAD --Tako Tsubo cardiomyopathy --Diastolic  CHF, resolved, Dr Missy Sabins --AV block first-degree ; Paroxysmal Afib with RVR/Tachybrady syndrome s/p admission: implanted aSt Jude PPM 11/06/16 DJD: --Compression fractures --Lumbar stenosis Dr. Carloyn Manner --Multiple pelvic fractures , cervical spine compression  fracture 2013 [facility] --H/o nonhealing rib  --Osteoporosis: Dr. Agapito Games; s/p forteo, s/p  Prolia 09-2015. Last DEXA 09-29-16 OPHT: --Glaucoma  --S/p cornea transplant -- mac degeneration h/o granuloma annulare   H/o pseudomembranous colitis 2013  PLAN: Acute on chronic CHF: Was admitted to the hospital, diuresed  7.7 L. Was recommended to hold ACE inhibitors and Lasix. Subsequently saw cardiology on 06/27/2018: Like me, they noticed some crackles at both bases.  They recommended Lasix 40 mg for 3 days and then to take as needed if she gains 3 pounds in a day or 5 pounds in a week. Her weight at home was 159 when she left the hospital and today is 157 (probably d/t lasix) They are planning to do blood work on December 31  I remind her about all cards these recommendations, see AVS Fatigue: Probably multifactorial.  I do not see any obvious reason, will check CBC, TSH for completeness. HTN: Ambulatory BPs 111/63, 138/75. No change  Social: Still lives by herself, drives, her son visits her twice a week.   Has a follow-up 08-2018

## 2018-06-29 NOTE — Telephone Encounter (Signed)
06/29/18   Transition Care Management Follow-up Telephone Call  ADMISSION DATE: 06/16/18  DISCHARGE DATE: 06/24/18  How have you been since you were released from the hospital? Feeling better. Experiencing fatigue.    Do you understand why you were in the hospital? Yes   Do you understand the discharge instrcutions? Yes    Items Reviewed: Medications reviewed: Yes   Allergies reviewed: Yes   Dietary changes reviewed: Heart Healthy   Referrals reviewed: Seen by Cardiologist on Monday   Functional Questionnaire:  Activities of Daily Living (ADLs): Patient can perform all independently.   Any patient concerns? Fatigue she is experiencing.   Confirmed importance and date/time of follow-up visits scheduled: Yes   Confirmed with patient if condition begins to worsen call PCP or go to the ER. Yes    Patient was given the office number and encouragred to call back with questions or concerns. Yes

## 2018-06-30 NOTE — Assessment & Plan Note (Signed)
Acute on chronic CHF: Was admitted to the hospital, diuresed  7.7 L. Was recommended to hold ACE inhibitors and Lasix. Subsequently saw cardiology on 06/27/2018: Like me, they noticed some crackles at both bases.  They recommended Lasix 40 mg for 3 days and then to take as needed if she gains 3 pounds in a day or 5 pounds in a week. Her weight at home was 159 when she left the hospital and today is 157 (probably d/t lasix) They are planning to do blood work on December 31  I remind her about all cards these recommendations, see AVS Fatigue: Probably multifactorial.  I do not see any obvious reason, will check CBC, TSH for completeness. HTN: Ambulatory BPs 111/63, 138/75. No change  Social: Still lives by herself, drives, her son visits her twice a week.   Has a follow-up 08-2018

## 2018-07-12 ENCOUNTER — Encounter: Payer: Self-pay | Admitting: Physician Assistant

## 2018-07-12 ENCOUNTER — Ambulatory Visit (INDEPENDENT_AMBULATORY_CARE_PROVIDER_SITE_OTHER): Payer: Medicare Other | Admitting: Physician Assistant

## 2018-07-12 VITALS — BP 122/62 | HR 97 | Ht 64.0 in | Wt 158.6 lb

## 2018-07-12 DIAGNOSIS — I5032 Chronic diastolic (congestive) heart failure: Secondary | ICD-10-CM | POA: Diagnosis not present

## 2018-07-12 DIAGNOSIS — Z7901 Long term (current) use of anticoagulants: Secondary | ICD-10-CM | POA: Diagnosis not present

## 2018-07-12 DIAGNOSIS — N183 Chronic kidney disease, stage 3 unspecified: Secondary | ICD-10-CM

## 2018-07-12 DIAGNOSIS — I4819 Other persistent atrial fibrillation: Secondary | ICD-10-CM

## 2018-07-12 DIAGNOSIS — I5033 Acute on chronic diastolic (congestive) heart failure: Secondary | ICD-10-CM | POA: Diagnosis not present

## 2018-07-12 LAB — CUP PACEART REMOTE DEVICE CHECK
Battery Remaining Longevity: 118 mo
Brady Statistic AP VP Percent: 1 %
Brady Statistic AP VS Percent: 1 %
Brady Statistic AS VP Percent: 12 %
Brady Statistic AS VS Percent: 87 %
Brady Statistic RA Percent Paced: 1 %
Brady Statistic RV Percent Paced: 12 %
Date Time Interrogation Session: 20191029060014
Implantable Lead Implant Date: 20180427
Implantable Lead Implant Date: 20180427
Implantable Lead Location: 753860
Implantable Pulse Generator Implant Date: 20180427
Lead Channel Impedance Value: 510 Ohm
Lead Channel Pacing Threshold Amplitude: 0.5 V
Lead Channel Pacing Threshold Pulse Width: 0.5 ms
Lead Channel Pacing Threshold Pulse Width: 0.5 ms
Lead Channel Sensing Intrinsic Amplitude: 10.4 mV
Lead Channel Setting Pacing Amplitude: 2.5 V
Lead Channel Setting Pacing Amplitude: 2.5 V
Lead Channel Setting Pacing Pulse Width: 0.5 ms
Lead Channel Setting Sensing Sensitivity: 2 mV
MDC IDC LEAD LOCATION: 753859
MDC IDC MSMT BATTERY REMAINING PERCENTAGE: 95.5 %
MDC IDC MSMT BATTERY VOLTAGE: 2.99 V
MDC IDC MSMT LEADCHNL RA IMPEDANCE VALUE: 400 Ohm
MDC IDC MSMT LEADCHNL RA PACING THRESHOLD AMPLITUDE: 1 V
MDC IDC MSMT LEADCHNL RA SENSING INTR AMPL: 0.8 mV
Pulse Gen Model: 2272
Pulse Gen Serial Number: 8900487

## 2018-07-12 MED ORDER — FUROSEMIDE 40 MG PO TABS: 20.0000 mg | ORAL_TABLET | Freq: Every day | ORAL | Status: DC

## 2018-07-12 NOTE — Patient Instructions (Signed)
Medication Instructions:  Your physician has recommended you make the following change in your medication:   REDUCE Lasix to 20mg  (1/2 tablet) daily  If you need a refill on your cardiac medications before your next appointment, please call your pharmacy.   Lab work: Art gallery manager today  If you have labs (blood work) drawn today and your tests are completely normal, you will receive your results only by: Marland Kitchen MyChart Message (if you have MyChart) OR . A paper copy in the mail If you have any lab test that is abnormal or we need to change your treatment, we will call you to review the results.  Testing/Procedures: None ordered  Follow-Up: At [facility], you and your health needs are our priority.  As part of our continuing mission to provide you with exceptional heart care, we have created designated Provider Care Teams.  These Care Teams include your primary Cardiologist (physician) and Advanced Practice Providers (APPs -  Physician Assistants and Nurse Practitioners) who all work together to provide you with the care you need, when you need it. Your physician recommends that you schedule a follow-up appointment in: 2 months with Dr.Benshimon  Any Other Special Instructions Will Be Listed Below (If Applicable). Your physician recommends that you weigh, daily, at the same time every day, and in the same amount of clothing. Please record your daily weights on the handout provided and bring it to your next appointment.  Call the office if your systolic BP (top number) is < 100

## 2018-07-12 NOTE — Progress Notes (Signed)
Cardiology Office Note   Date:  07/12/2018   ID:  [name], [name] 1922-12-05, MRN [id number]  PCP:  Colon Branch, MD Cardiologist:  Glori Bickers, MD 03/19/2017 Rosaria Ferries, PA-C 06/27/2018  Chief Complaint  Patient presents with  . Follow-up    pt denied chest pain    History of Present Illness: [name] is a 84 y.o. female with a history of D-CHF, Takotsubo CM 2005, CAD (70-80% stenosis of small caliber dOM2 on LHC in 2005), PAF on Xarelto, SSS s/p PPM, HTN  12/16 office visit, patient volume overloaded with a weight of 159 pounds, Lasix 40 mg for 3 days only and then as needed, salt restriction encouraged, early follow-up and check labs  [name] presents for cardiology follow up.  She feels much better than she did. Has more energy now.   Legs are not swollen. Denies orthopnea (had before) and PND.   She has taken the Lasix most days, can tell a difference if she does not.   She is watching the salt more carefully now.   Her activity level is better, but not high. She cares for the house, gets out sometimes. She went to [facility] for the [facility] time in weeks on Saturday, tolerated well. She had not felt like walking much, too weak and tired, better now.   No chest pain.   No palpitations, no real awareness of the Afib.    Past Medical History:  Diagnosis Date  . AV block, 1st degree   . CAD (coronary artery disease)    Non ST elevation 2005 ; LHC in 6/05 in setting of NSTEMI: ant apical AK and inf-apical AK, EF 29%, dOM2 70-80% (small); findings c/w Tako-Tsubo CM  . Cardiomyopathy, nonischemic ([place]) 04/2008   Likely Tako-Tsubo. Resolved. --dx'd on cath 2005. EF 29% with minimal distal CAD (small OM1 70-80).  Echo 10/09 EF 55%;  echo 01/26/12: EF 55%, mild LAE, PASP 34.  Marland Kitchen CHB (complete heart block) (HCC)    Intermittent, s/p Saint Jude PPM  . Colitis, ischemic ([place])   . Complication of anesthesia    post anesthesia excessive somnulence   . Compression fracture of C-spine ([place]) 10/10/2011   Fell at home, tx [facility]  . Diarrhea associated with pseudomembranous colitis 6/11-17/2013   [facility]  . Dyslipidemia   . Fall    with non-healing rib fractures  . Glaucoma   . Granuloma annulare   . History of phlebitis   . Hypertension    SEVERE  . Idiopathic thrombocytopenic purpura (ITP) (HCC)   . Lower extremity edema   . Lumbar stenosis 2004   DR. MARK ROY  . Multiple pelvic fractures 05/2012   Dr Adaline Sill , [facility]  . Osteoporosis     Past Surgical History:  Procedure Laterality Date  . ABDOMINAL HYSTERECTOMY    . APPENDECTOMY    . BACK SURGERY  10/2004   LS Disc 4-5 no sx.  Marland Kitchen CARDIAC CATHETERIZATION     12/2003  . CATARACT EXTRACTION, BILATERAL    . COLONOSCOPY  09/2005   adenoma  . COLONOSCOPY W/ POLYPECTOMY    . 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 PACEMAKER IMPLANT N/A 11/06/2016   Procedure: St Jude Pacemaker Implant;  Surgeon: Evans Lance, MD;  Location: [place] [facility];  Service: Cardiovascular;  Laterality: N/A;  . rectal bleed  05-01-06   colonoscopy-ischemic colitis  . ROTATOR CUFF REPAIR    .  VERTEBROPLASTY     Dr Gladstone Lighter    Current Outpatient Medications  Medication Sig Dispense Refill  . beta carotene w/minerals (OCUVITE) tablet Take 1 tablet by mouth daily.     . brimonidine (ALPHAGAN) 0.2 % ophthalmic solution Place 1 drop into the right eye 2 (two) times daily.      . Calcium Carbonate-Vit D-Min (CALCIUM 1200 PO) Take 1,200 mg by mouth daily with breakfast.    . carboxymethylcellulose (REFRESH TEARS) 0.5 % SOLN Place 1 drop into both eyes 2 (two) times daily.     . carvedilol (COREG) 12.5 MG tablet Take 1 1/2 tablet twice daily (18.75 mg total) 90 tablet 3  . Cholecalciferol (VITAMIN D3) 3000 UNITS TABS Take 3,000 Units by mouth daily.     . diphenhydrAMINE (BENADRYL) 25 MG tablet Take 25 mg by mouth at bedtime as needed for sleep (and sinus issues).     . docusate sodium  (COLACE) 100 MG capsule Take 100 mg by mouth daily as needed for mild constipation. Stool softener     . furosemide (LASIX) 40 MG tablet Take 40 mg for 3 days, then as needed. 90 tablet 2  . Misc Natural Products (OSTEO BI-FLEX ADV JOINT SHIELD) TABS Take 1 tablet by mouth daily.     Marland Kitchen omeprazole (PRILOSEC) 20 MG capsule Take 20 mg by mouth daily.      . Rivaroxaban (XARELTO) 15 MG TABS tablet Take 1 tablet (15 mg total) by mouth daily with supper. 90 tablet 1  . timolol (TIMOPTIC) 0.5 % ophthalmic solution Place 1 drop into the right eye 2 (two) times daily.  5  . vitamin B-12 (CYANOCOBALAMIN) 1000 MCG tablet Take 1,000 mcg by mouth 2 (two) times daily.      No current facility-administered medications for this visit.     Allergies:   Morphine and related; Oxycodone; Penicillins; Clindamycin/lincomycin; Colchicine; Hydroxychloroquine; Norvasc [amlodipine besylate]; Ofloxacin; Streptomycin; Tape; Tramadol; Alendronate sodium; Amiodarone; and Plaquenil [hydroxychloroquine sulfate]    Social History:  The patient  reports that she has never smoked. She has never used smokeless tobacco. She reports that she does not drink alcohol or use drugs.   Family History:  The patient's family history includes Breast cancer in her sister and sister; Colon cancer in her father; Coronary artery disease in her mother; Heart failure in her mother; Kidney disease in her mother; Rectal cancer in her sister; Renal cancer in her mother; Stroke in her father.  She indicated that her mother is deceased. She indicated that her father is deceased. She indicated that two of her four sisters are deceased. She indicated that her maternal grandmother is deceased. She indicated that her maternal grandfather is deceased. She indicated that her paternal grandmother is deceased. She indicated that her paternal grandfather is deceased. She reported the following about one of her maternal aunts: colon cancer. She reported the  following about her other maternal aunt of unknown status: colon cancer. She indicated that the status of her neg hx is unknown.   ROS:  Please see the history of present illness. All other systems are reviewed and negative.    PHYSICAL EXAM: VS:  BP 122/62   Pulse 97   Ht 5\' 4"  (1.626 m)   Wt 158 lb 9.6 oz (71.9 kg)   BMI 27.22 kg/m  , BMI Body mass index is 27.22 kg/m. GEN: Well nourished, well developed, female in no acute distress HEENT: normal for age  Neck: Minimal JVD, no carotid bruit, no  masses Cardiac: Irregular rate and rhythm; soft murmur, no rubs, or gallops Respiratory:  clear to auscultation bilaterally, normal work of breathing GI: soft, nontender, nondistended, + BS MS: no deformity or atrophy; no edema; distal pulses are 2+ in all 4 extremities  Skin: warm and dry, no rash Neuro:  Strength and sensation are intact Psych: euthymic mood, full affect   EKG:  EKG is ordered today. The ekg ordered today demonstrates atrial fibrillation, heart rate 97, no significant change from 06/17/2018  Echocardiogram12/6/19: Study Conclusions - Left ventricle: Systolic function was normal. The estimated ejection fraction was in the range of 55% to 60%. The study is not technically sufficient to allow evaluation of LV diastolic function. - Aortic valve: Sclerosis without stenosis. There was trivial regurgitation. - Mitral valve: Calcified annulus. Mildly thickened leaflets . There was mild regurgitation. - Left atrium: The atrium was mildly dilated. - Right ventricle: Catheter in RV The cavity size was mildly dilated. - Right atrium: The atrium was mildly dilated. - Tricuspid valve: There was moderate regurgitation. - Pulmonary arteries: PA peak pressure: 60 mm Hg (S).   Recent Labs: 06/16/2018: Hemoglobin 10.8; Platelets 61 06/17/2018: Magnesium 1.9 06/23/2018: B Natriuretic Peptide 187.7 06/24/2018: BUN 35; Creatinine, Ser 2.04; Potassium 4.5; Sodium  138  CBC    Component Value Date/Time   WBC 4.3 06/16/2018 1608   RBC 3.57 (L) 06/16/2018 1608   HGB 10.8 (L) 06/16/2018 1608   HGB 11.2 (L) 11/05/2014 1525   HCT 34.9 (L) 06/16/2018 1608   HCT 33.2 (L) 11/05/2014 1525   PLT 61 (L) 06/16/2018 1608   PLT 74 (L) 11/05/2014 1525   MCV 97.8 06/16/2018 1608   MCV 96.0 11/05/2014 1525   MCH 30.3 06/16/2018 1608   MCHC 30.9 06/16/2018 1608   RDW 13.3 06/16/2018 1608   RDW 14.0 11/05/2014 1525   LYMPHSABS 1.6 05/02/2018 1439   LYMPHSABS 1.5 11/05/2014 1525   MONOABS 0.4 05/02/2018 1439   MONOABS 0.5 11/05/2014 1525   EOSABS 0.2 05/02/2018 1439   EOSABS 0.6 (H) 11/05/2014 1525   BASOSABS 0.0 05/02/2018 1439   BASOSABS 0.0 11/05/2014 1525   CMP Latest Ref Rng & Units 06/24/2018 06/23/2018 06/22/2018  Glucose 70 - 99 mg/dL 98 114(H) 99  BUN 8 - 23 mg/dL 35(H) 38(H) 34(H)  Creatinine 0.44 - 1.00 mg/dL 2.04(H) 2.39(H) 2.14(H)  Sodium 135 - 145 mmol/L 138 138 138  Potassium 3.5 - 5.1 mmol/L 4.5 4.7 4.6  Chloride 98 - 111 mmol/L 101 102 99  CO2 22 - 32 mmol/L 26 27 27   Calcium 8.9 - 10.3 mg/dL 9.3 9.1 9.4  Total Protein 6.0 - 8.3 g/dL - - -  Total Bilirubin 0.2 - 1.2 mg/dL - - -  Alkaline Phos 39 - 117 U/L - - -  AST 0 - 37 U/L - - -  ALT 0 - 35 U/L - - -     Lipid Panel Lab Results  Component Value Date   CHOL  12/14/2007    112        ATP III CLASSIFICATION:  <200     mg/dL   Desirable  200-239  mg/dL   Borderline High  >=240    mg/dL   High   HDL 24 (L) 12/14/2007   LDLCALC  12/14/2007    69        Total Cholesterol/HDL:CHD Risk Coronary Heart Disease Risk Table  Men   Women  1/2 Average Risk   3.4   3.3   TRIG 97 12/14/2007   CHOLHDL 4.7 12/14/2007      Wt Readings from Last 3 Encounters:  07/12/18 158 lb 9.6 oz (71.9 kg)  06/29/18 158 lb 6 oz (71.8 kg)  06/27/18 159 lb 9.6 oz (72.4 kg)     Other studies Reviewed: Additional studies/ records that were reviewed today include: Office  notes, hospital records and testing.  ASSESSMENT AND PLAN:  1.  Long-term persistent atrial fibrillation: Per Dr. Lovena Le, she is rate control only, with no attempt to get her back in sinus rhythm. -She is tolerating the atrial fibrillation well.  Now that her volume status is improved, her fatigue rating is lower and she feels better, has more energy. -Continue carvedilol at the current dose.  2.  Chronic anticoagulation: She is having no bleeding issues on the Xarelto and is compliant.  3.  Chronic diastolic CHF: Her volume status has improved and so has her energy level and fatigue. -She states she has been taking the Lasix most days, feels like she needs it. - Her systolic blood pressure runs a little low at times, 84/60 at one point. - Decrease the Lasix to 20 mg and asked her to take it daily.  Hopefully, with sodium compliance, this will control her volume.  4.  CKD III/IV -Previous creatinine was 2, recheck today.   Current medicines are reviewed at length with the patient today.  The patient has concerns regarding medicines.  The following changes have been made: Change Lasix to 20 mg daily  Labs/ tests ordered today include:   Orders Placed This Encounter  Procedures  . Basic metabolic panel  . EKG 12-Lead     Disposition:   FU with Glori Bickers, MD  Signed, Rosaria Ferries, PA-C  07/12/2018 2:45 PM    [facility] Phone: [phone]; Fax: [phone]

## 2018-07-13 LAB — BASIC METABOLIC PANEL
BUN/Creatinine Ratio: 20 (ref 12–28)
BUN: 29 mg/dL (ref 10–36)
CALCIUM: 9.5 mg/dL (ref 8.7–10.3)
CO2: 26 mmol/L (ref 20–29)
CREATININE: 1.45 mg/dL — AB (ref 0.57–1.00)
Chloride: 102 mmol/L (ref 96–106)
GFR calc Af Amer: 36 mL/min/{1.73_m2} — ABNORMAL LOW (ref >59)
GFR calc non Af Amer: 31 mL/min/{1.73_m2} — ABNORMAL LOW (ref >59)
Glucose: 78 mg/dL (ref 65–99)
Potassium: 4.2 mmol/L (ref 3.5–5.2)
Sodium: 142 mmol/L (ref 134–144)

## 2018-07-18 DIAGNOSIS — H353134 Nonexudative age-related macular degeneration, bilateral, advanced atrophic with subfoveal involvement: Secondary | ICD-10-CM | POA: Diagnosis not present

## 2018-07-25 ENCOUNTER — Other Ambulatory Visit (HOSPITAL_COMMUNITY): Payer: Self-pay | Admitting: *Deleted

## 2018-07-25 ENCOUNTER — Other Ambulatory Visit: Payer: Self-pay

## 2018-07-25 DIAGNOSIS — I480 Paroxysmal atrial fibrillation: Secondary | ICD-10-CM

## 2018-07-25 MED ORDER — RIVAROXABAN 15 MG PO TABS: 15.0000 mg | ORAL_TABLET | Freq: Every day | ORAL | 3 refills | Status: DC

## 2018-07-25 NOTE — Patient Outreach (Signed)
[facility]) Care Management  07/25/2018  [name] 06/28/1923 [id number]   EMMI-HEART FAILURE  RED ON EMMI ALERT Day # #29 Date: 07/23/18  10:02 AM Red Alert Reason: "Questions about Meds? Yes"   Outreach attempt # 1 successful.    Spoke with patient, HIPAA verified.   Reviewed and addressed red alert. Patient states she does not have any questions about her medications. She denies having financial hardship at this time but is unsure of how her new coverage will pay for her Xarelto. RN CM instructed patient to notify her PCP if she needs financial assistance in the future. She verbalizes understanding. Patient states she has a good understanding about what medications to take and when to take them.  Patient lives alone but has her son and a good neighbor available to assist her as needed. She states transportation is not an issue at this time and she denies having any questions or concerns. RN CM instructed patient to continue monitoring her daily weights. Patient is able to verbalize who to call to report a change in her condition and confirms having the MD contact information.   Advised patient she will receive additional automated EMMI-HEART FAILURE post discharge calls and may receive a call from a nurse if any of her responses are abnormal. Patient voiced understanding and was appreciative of f/u call.  Plan: RN CM will close case due to patient is stable and no CM needs are identified at this time.    Barb Merino, RN,CCM [facility] Care Management Care Management Coordinator Direct Phone: [phone] Toll Free: [phone] Fax: [phone]

## 2018-07-28 ENCOUNTER — Telehealth: Payer: Self-pay

## 2018-07-28 DIAGNOSIS — N183 Chronic kidney disease, stage 3 (moderate): Secondary | ICD-10-CM | POA: Diagnosis not present

## 2018-07-28 DIAGNOSIS — Z950 Presence of cardiac pacemaker: Secondary | ICD-10-CM | POA: Diagnosis not present

## 2018-07-28 DIAGNOSIS — I2510 Atherosclerotic heart disease of native coronary artery without angina pectoris: Secondary | ICD-10-CM

## 2018-07-28 DIAGNOSIS — I5021 Acute systolic (congestive) heart failure: Secondary | ICD-10-CM | POA: Diagnosis not present

## 2018-07-28 DIAGNOSIS — I130 Hypertensive heart and chronic kidney disease with heart failure and stage 1 through stage 4 chronic kidney disease, or unspecified chronic kidney disease: Secondary | ICD-10-CM | POA: Diagnosis not present

## 2018-07-28 DIAGNOSIS — M810 Age-related osteoporosis without current pathological fracture: Secondary | ICD-10-CM | POA: Diagnosis not present

## 2018-07-28 DIAGNOSIS — Z7901 Long term (current) use of anticoagulants: Secondary | ICD-10-CM | POA: Diagnosis not present

## 2018-07-28 DIAGNOSIS — I4819 Other persistent atrial fibrillation: Secondary | ICD-10-CM | POA: Diagnosis not present

## 2018-07-28 DIAGNOSIS — D631 Anemia in chronic kidney disease: Secondary | ICD-10-CM | POA: Diagnosis not present

## 2018-07-28 DIAGNOSIS — I5033 Acute on chronic diastolic (congestive) heart failure: Secondary | ICD-10-CM | POA: Diagnosis not present

## 2018-07-28 NOTE — Telephone Encounter (Signed)
Plan of care signed and faxed to [facility] at [phone]. Form sent for scanning.

## 2018-08-09 ENCOUNTER — Ambulatory Visit (INDEPENDENT_AMBULATORY_CARE_PROVIDER_SITE_OTHER): Payer: Medicare Other

## 2018-08-09 DIAGNOSIS — R001 Bradycardia, unspecified: Secondary | ICD-10-CM | POA: Diagnosis not present

## 2018-08-09 DIAGNOSIS — I442 Atrioventricular block, complete: Secondary | ICD-10-CM

## 2018-08-10 NOTE — Progress Notes (Signed)
Remote pacemaker transmission.   

## 2018-08-11 LAB — CUP PACEART REMOTE DEVICE CHECK
Battery Remaining Longevity: 119 mo
Battery Remaining Percentage: 95.5 %
Battery Voltage: 3.01 V
Brady Statistic AP VS Percent: 1 %
Brady Statistic AS VP Percent: 12 %
Brady Statistic AS VS Percent: 87 %
Brady Statistic RA Percent Paced: 1 %
Brady Statistic RV Percent Paced: 12 %
Date Time Interrogation Session: 20200128070013
Implantable Lead Implant Date: 20180427
Implantable Lead Implant Date: 20180427
Implantable Lead Location: 753859
Implantable Pulse Generator Implant Date: 20180427
Lead Channel Impedance Value: 430 Ohm
Lead Channel Pacing Threshold Amplitude: 0.5 V
Lead Channel Pacing Threshold Amplitude: 1 V
Lead Channel Pacing Threshold Pulse Width: 0.5 ms
Lead Channel Pacing Threshold Pulse Width: 0.5 ms
Lead Channel Sensing Intrinsic Amplitude: 0.8 mV
Lead Channel Sensing Intrinsic Amplitude: 11.7 mV
Lead Channel Setting Pacing Amplitude: 2.5 V
Lead Channel Setting Pacing Amplitude: 2.5 V
Lead Channel Setting Sensing Sensitivity: 2 mV
MDC IDC LEAD LOCATION: 753860
MDC IDC MSMT LEADCHNL RV IMPEDANCE VALUE: 510 Ohm
MDC IDC SET LEADCHNL RV PACING PULSEWIDTH: 0.5 ms
MDC IDC STAT BRADY AP VP PERCENT: 1 %
Pulse Gen Model: 2272
Pulse Gen Serial Number: 8900487

## 2018-08-16 ENCOUNTER — Telehealth: Payer: Self-pay

## 2018-08-16 NOTE — Telephone Encounter (Signed)
Spoke with patient and she stated her weight and breathing is doing well. She states she has not had to take any extra Lasix but will if she needs to.

## 2018-08-16 NOTE — Telephone Encounter (Signed)
-----   Message from Lonn Georgia, PA-C sent at 08/16/2018 10:54 AM EST ----- Regarding: RE: please check this out Please let her know that her renal function is better than on her last labs.  Please make sure that she is breathing okay and tracking her weight.  Okay to take an extra Lasix tablet once or twice a week if needed to control her weight. Thanks  ----- Message ----- From: Harold Hedge, CMA Sent: 08/15/2018  11:47 AM EST To: Lonn Georgia, PA-C Subject: please check this out                          Hey this patient had labs on 07/13/2019 and they have not been resulted. Can you review and I will contact the patient. Thanks, [employer]

## 2018-08-24 IMAGING — DX DG LUMBAR SPINE COMPLETE 4+V
5 series · 5 of 5 positions shown · non-contrast
Comparison: 06/14/2017

CLINICAL DATA: Acute left-sided low back pain with left-sided
sciatica.

EXAM:
LUMBAR SPINE - COMPLETE 4+ VIEW

[l-spine ap (1 of 2)]
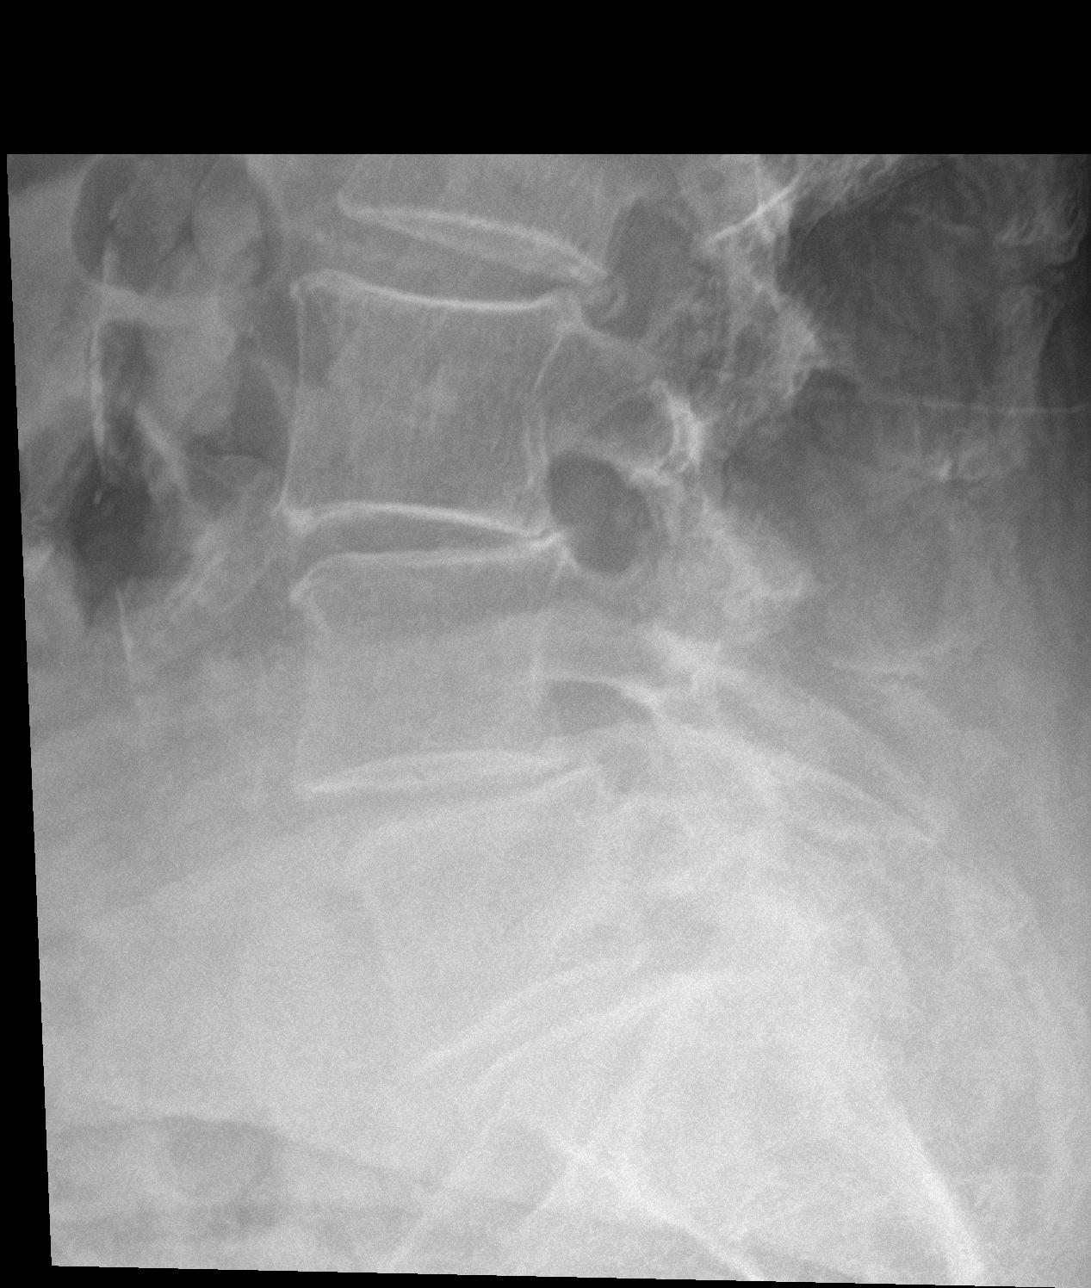

[l-spine obl (1 of 2)]
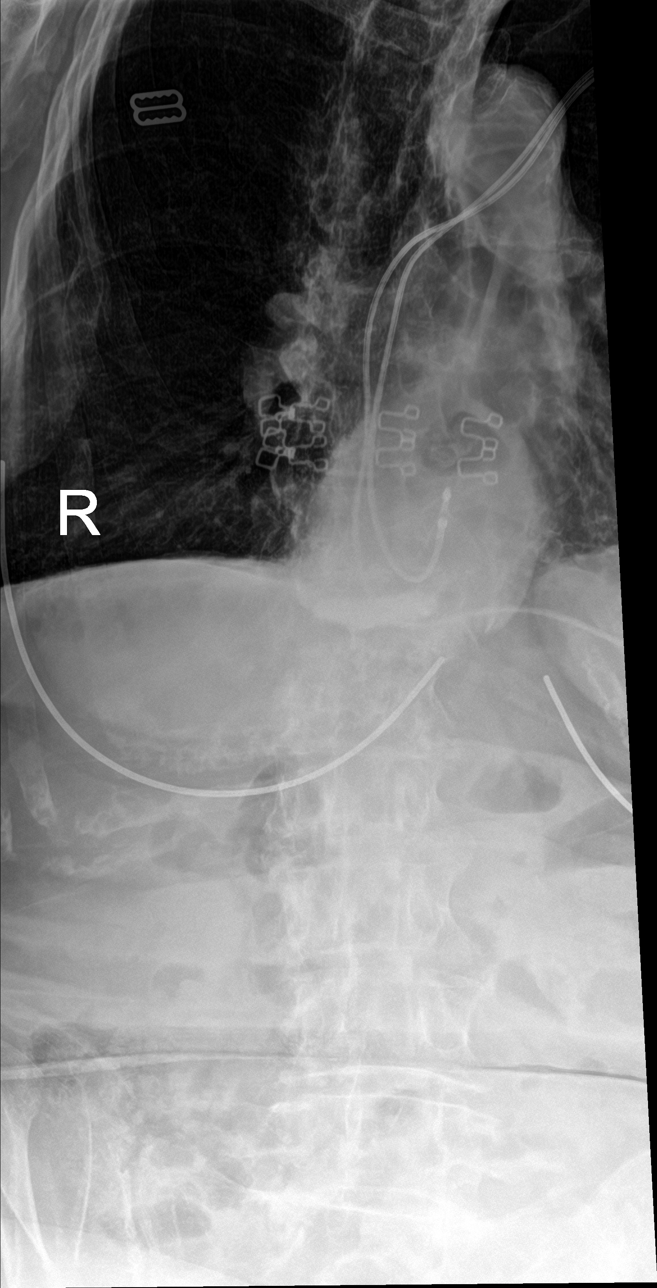

[l-spine obl (2 of 2)]
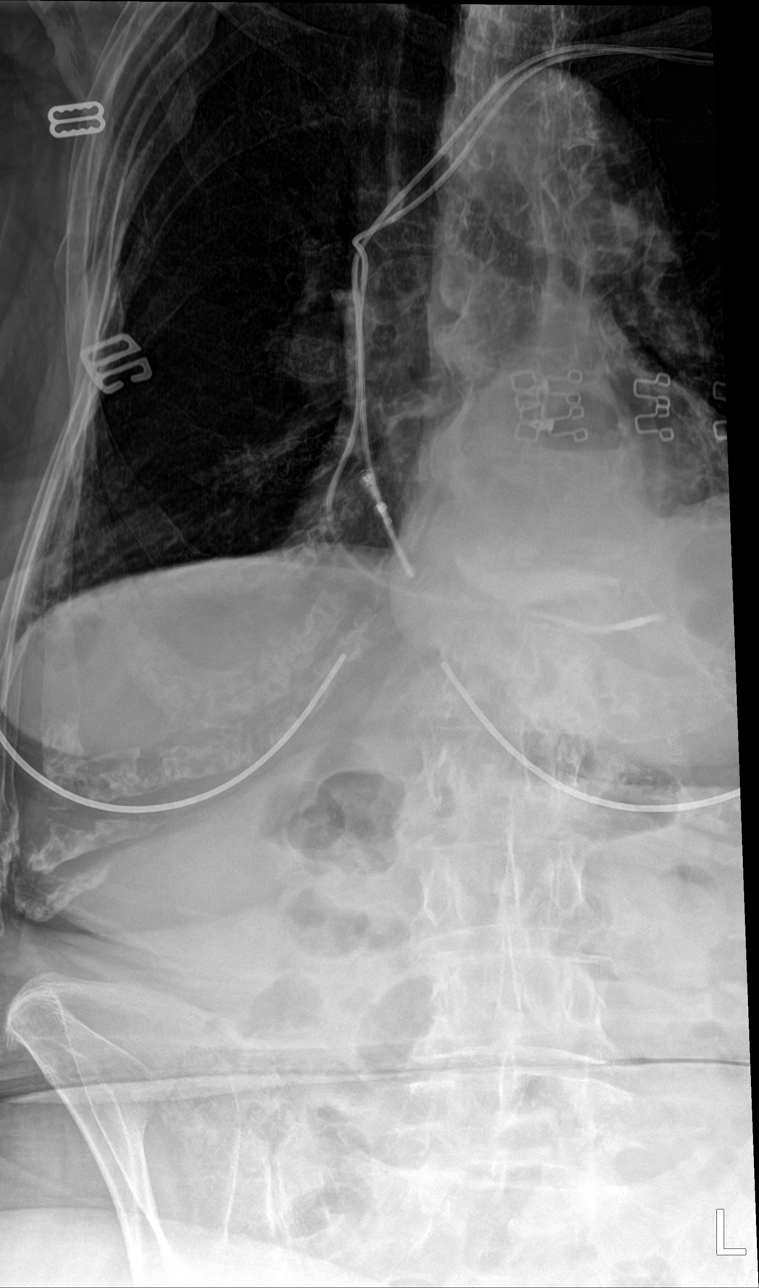

[l-spine lat]
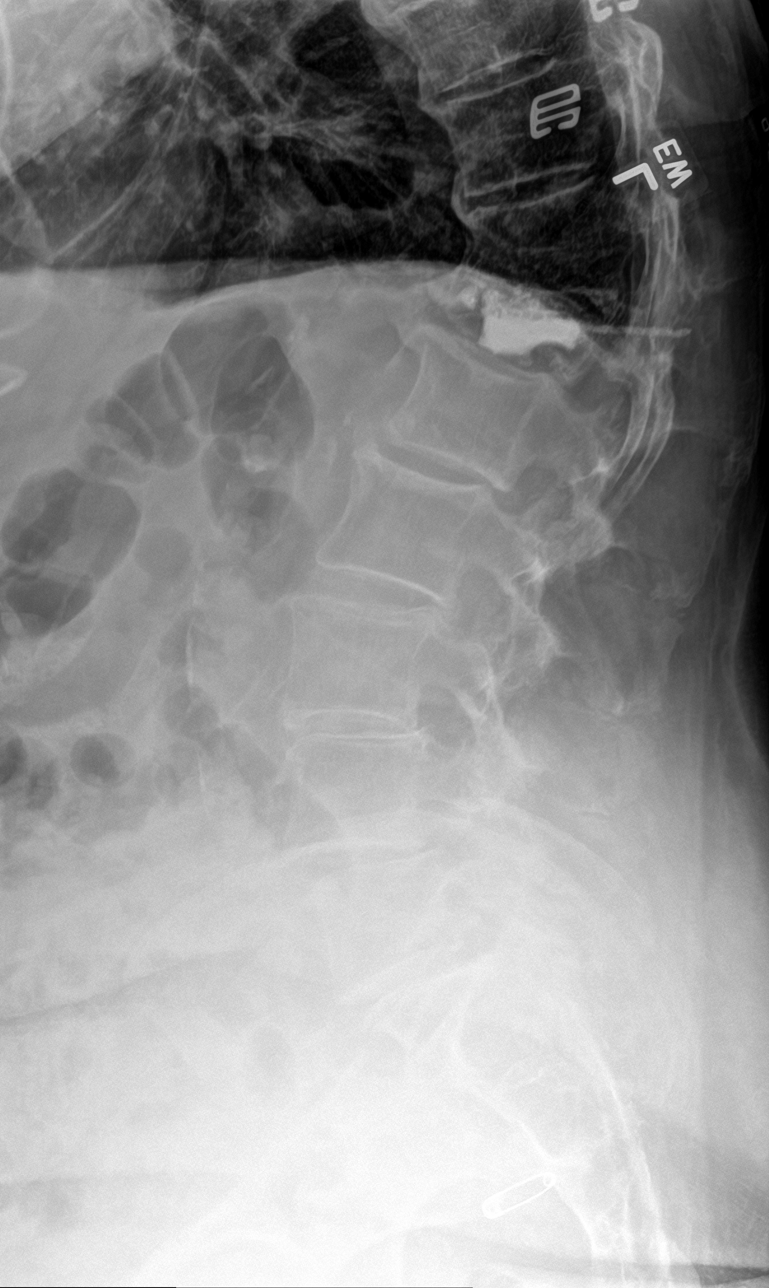

[l-spine ap (2 of 2)]
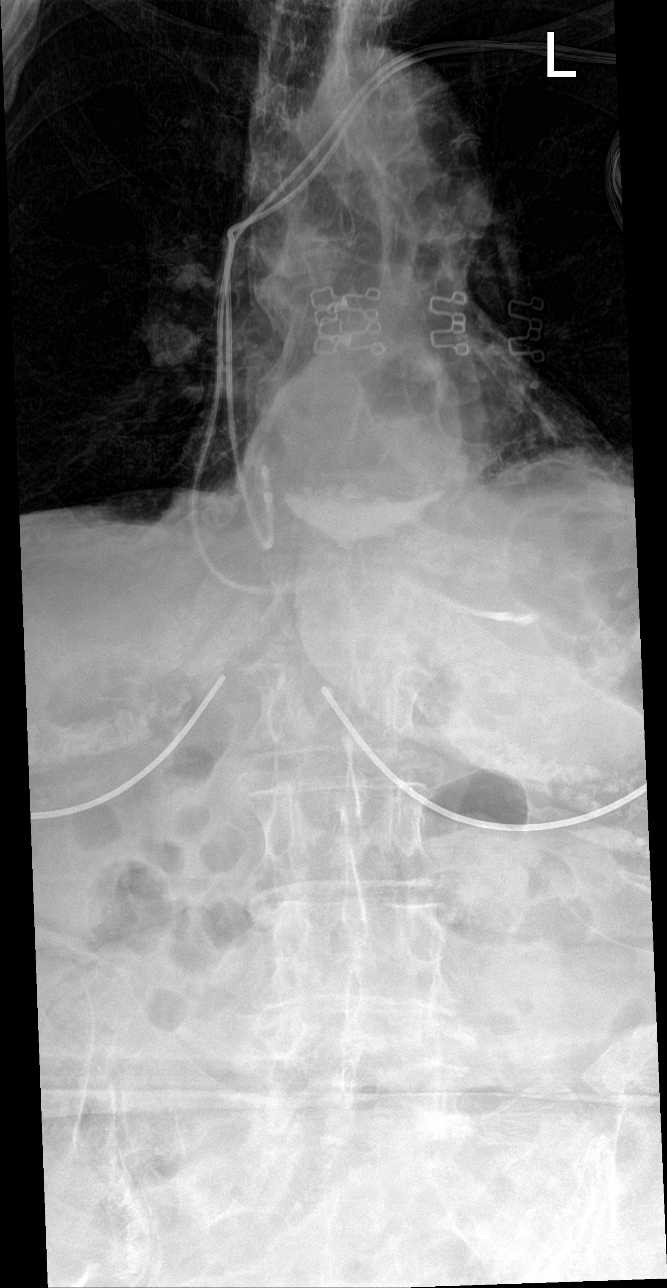

[5 of 5 positions shown; findings below may reference images not displayed]

FINDINGS: There is no acute fracture or bone destruction. There is an old
fracture of T12 treated with kyphoplasty. There is disc space
narrowing at L1-2, L3-4, L4-5, and L5-S1. Slight spondylolisthesis
at L3-4 and L4-5. Slight retrolisthesis at L1-2.

Incidental note is made of a moderate hiatal hernia. Aortic
atherosclerosis.
IMPRESSION: No acute abnormality of the lumbar spine. Multilevel degenerative
disc disease.

Aortic Atherosclerosis ([id number].A).

## 2018-08-30 ENCOUNTER — Ambulatory Visit: Payer: Medicare Other | Admitting: Internal Medicine

## 2018-09-02 ENCOUNTER — Ambulatory Visit: Payer: Medicare Other | Admitting: Internal Medicine

## 2018-09-06 ENCOUNTER — Encounter: Payer: Self-pay | Admitting: Internal Medicine

## 2018-09-06 ENCOUNTER — Ambulatory Visit (INDEPENDENT_AMBULATORY_CARE_PROVIDER_SITE_OTHER): Payer: Medicare Other | Admitting: Internal Medicine

## 2018-09-06 VITALS — BP 126/64 | HR 82 | Temp 98.2°F | Resp 16 | Ht 64.0 in | Wt 167.0 lb

## 2018-09-06 DIAGNOSIS — I10 Essential (primary) hypertension: Secondary | ICD-10-CM

## 2018-09-06 DIAGNOSIS — R739 Hyperglycemia, unspecified: Secondary | ICD-10-CM

## 2018-09-06 DIAGNOSIS — I5033 Acute on chronic diastolic (congestive) heart failure: Secondary | ICD-10-CM

## 2018-09-06 LAB — BASIC METABOLIC PANEL
BUN: 22 mg/dL (ref 6–23)
CO2: 26 mEq/L (ref 19–32)
CREATININE: 1.41 mg/dL — AB (ref 0.40–1.20)
Calcium: 8.6 mg/dL (ref 8.4–10.5)
Chloride: 105 mEq/L (ref 96–112)
GFR: 34.73 mL/min — ABNORMAL LOW (ref >60.00)
Glucose, Bld: 84 mg/dL (ref 70–99)
Potassium: 4.1 mEq/L (ref 3.5–5.1)
Sodium: 141 mEq/L (ref 135–145)

## 2018-09-06 LAB — HEMOGLOBIN A1C: Hgb A1c MFr Bld: 6.4 % (ref 4.6–6.5)

## 2018-09-06 NOTE — Progress Notes (Signed)
Pre visit review using our clinic review tool, if applicable. No additional management support is needed unless otherwise documented below in the visit note. 

## 2018-09-06 NOTE — Patient Instructions (Addendum)
Please schedule Medicare Wellness with Glenard Haring after 11/02/2018.   GO TO THE LAB : Get the blood work     GO TO THE FRONT DESK Schedule your next appointment   for a checkup in 3 months  Take Lasix 40 mg: 1 tablet daily for 5 days After that, go back to half tablet daily  Weight yourself daily, be sure that your weight is  close to the 155 pounds  If your weight stays over 160 consistently when you check at home, please let me or the heart doctors know.

## 2018-09-06 NOTE — Progress Notes (Signed)
Subjective:    Patient ID: [name], female    DOB: [birth year], 85 y.o.   MRN: [id number]  DOS:  09/06/2018 Type of visit - description: follow up Since the last office visit, she was seen by cardiology, they noted that she was doing well with improved volume status.  Her BP was running on the low side, they recommended to decrease Lasix to 20 mg daily. Last BMP satisfactory.  She is here for follow-up. Good compliance with medication. Reports her weight at home range from 155 to 163 pounds.  This morning she was 163. Ambulatory BPs running between 110-120.  2 days ago, for 24 hours, experienced chills, generalized aches.  She is now completely asymptomatic.  Wt Readings from Last 3 Encounters:  09/06/18 167 lb (75.8 kg)  07/12/18 158 lb 9.6 oz (71.9 kg)  06/29/18 158 lb 6 oz (71.8 kg)     Review of Systems Denies cough, sputum production or a rash No dysuria or gross hematuria.  No difficulty urinating No shortness of breath with ADLs.  Mild edema, at baseline  Past Medical History:  Diagnosis Date  . AV block, 1st degree   . CAD (coronary artery disease)    Non ST elevation 2005 ; LHC in 6/05 in setting of NSTEMI: ant apical AK and inf-apical AK, EF 29%, dOM2 70-80% (small); findings c/w Tako-Tsubo CM  . Cardiomyopathy, nonischemic ([place]) 04/2008   Likely Tako-Tsubo. Resolved. --dx'd on cath 2005. EF 29% with minimal distal CAD (small OM1 70-80).  Echo 10/09 EF 55%;  echo 01/26/12: EF 55%, mild LAE, PASP 34.  Marland Kitchen CHB (complete heart block) (HCC)    Intermittent, s/p Saint Jude PPM  . Colitis, ischemic ([place])   . Complication of anesthesia    post anesthesia excessive somnulence  . Compression fracture of C-spine ([place]) 10/10/2011   Fell at home, tx at [facility]  . Diarrhea associated with pseudomembranous colitis 6/11-17/2013   [facility]  . Dyslipidemia   . Fall    with non-healing rib fractures  . Glaucoma   . Granuloma annulare   . History of phlebitis   .  Hypertension    SEVERE  . Idiopathic thrombocytopenic purpura (ITP) (HCC)   . Lower extremity edema   . Lumbar stenosis 2004   DR. MARK ROY  . Multiple pelvic fractures 05/2012   Dr Adaline Sill , [facility]  . Osteoporosis     Past Surgical History:  Procedure Laterality Date  . ABDOMINAL HYSTERECTOMY    . APPENDECTOMY    . BACK SURGERY  10/2004   LS Disc 4-5 no sx.  Marland Kitchen CARDIAC CATHETERIZATION     12/2003  . CATARACT EXTRACTION, BILATERAL    . COLONOSCOPY  09/2005   adenoma  . COLONOSCOPY W/ POLYPECTOMY    . 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 PACEMAKER IMPLANT N/A 11/06/2016   Procedure: St Jude Pacemaker Implant;  Surgeon: Evans Lance, MD;  Location: [place] [facility];  Service: Cardiovascular;  Laterality: N/A;  . rectal bleed  05-01-06   colonoscopy-ischemic colitis  . ROTATOR CUFF REPAIR    . VERTEBROPLASTY     Dr Gladstone Lighter    Social History   Socioeconomic History  . Marital status: Widowed    Spouse name: Not on file  . Number of children: 1  . Years of education: Not on file  . Highest education level: Not on file  Occupational History  . Occupation: retired    Fish farm manager:  RETIRED  Social Needs  . Financial resource strain: Not on file  . Food insecurity:    Worry: Not on file    Inability: Not on file  . Transportation needs:    Medical: Not on file    Non-medical: Not on file  Tobacco Use  . Smoking status: Never Smoker  . Smokeless tobacco: Never Used  Substance and Sexual Activity  . Alcohol use: No  . Drug use: No  . Sexual activity: Not Currently  Lifestyle  . Physical activity:    Days per week: Not on file    Minutes per session: Not on file  . Stress: Not on file  Relationships  . Social connections:    Talks on phone: Not on file    Gets together: Not on file    Attends religious service: Not on file    Active member of club or organization: Not on file    Attends meetings of clubs or organizations: Not on file    Relationship  status: Not on file  . Intimate partner violence:    Fear of current or ex partner: Not on file    Emotionally abused: Not on file    Physically abused: Not on file    Forced sexual activity: Not on file  Other Topics Concern  . Not on file  Social History Narrative   Widowed   Lives by herself in a town house, drives    Son Felcia Huebert       Allergies as of 09/06/2018      Reactions   Morphine And Related Anaphylaxis, Nausea Only   Per family, made her violently ill   Oxycodone Other (See Comments)   Dizziness    Penicillins Swelling, Other (See Comments)   Joint edema Has patient had a PCN reaction causing immediate rash, facial/tongue/throat swelling, SOB or lightheadedness with hypotension: Yes Has patient had a PCN reaction causing severe rash involving mucus membranes or skin necrosis: No Has patient had a PCN reaction that required hospitalization: No Has patient had a PCN reaction occurring within the last 10 years: No If all of the above answers are "NO", then may proceed with Cephalosporin use.   Clindamycin/lincomycin Hives, Itching   Colchicine Other (See Comments)   Hair loss   Norvasc [amlodipine Besylate] Other (See Comments)   Reaction not recalled by the patient ??   Ofloxacin Other (See Comments)   Unknown   Streptomycin Other (See Comments)   "was a long time ago" reaction not recalled   Tape Other (See Comments)   SKIN IS VERY THIN AND WILL TEAR AND BRUISE EASILY!!   Tramadol Itching   Alendronate Sodium Palpitations   Amiodarone Rash, Other (See Comments)   Possible rash and nervousness per patient   Plaquenil [hydroxychloroquine Sulfate] Rash      Medication List       Accurate as of September 06, 2018 11:59 PM. Always use your most recent med list.        beta carotene w/minerals tablet Take 1 tablet by mouth daily.   brimonidine 0.2 % ophthalmic solution Commonly known as:  ALPHAGAN Place 1 drop into the right eye 2 (two) times daily.     CALCIUM 1200 PO Take 1,200 mg by mouth daily with breakfast.   carvedilol 12.5 MG tablet Commonly known as:  COREG Take 1 1/2 tablet twice daily (18.75 mg total)   diphenhydrAMINE 25 MG tablet Commonly known as:  BENADRYL Take 25 mg by mouth  at bedtime as needed for sleep (and sinus issues).   docusate sodium 100 MG capsule Commonly known as:  COLACE Take 100 mg by mouth daily as needed for mild constipation. Stool softener   furosemide 40 MG tablet Commonly known as:  LASIX Take 0.5 tablets (20 mg total) by mouth daily.   omeprazole 20 MG capsule Commonly known as:  PRILOSEC Take 20 mg by mouth daily.   OSTEO BI-FLEX ADV JOINT SHIELD Tabs Take 1 tablet by mouth daily.   REFRESH TEARS 0.5 % Soln Generic drug:  carboxymethylcellulose Place 1 drop into both eyes 2 (two) times daily.   Rivaroxaban 15 MG Tabs tablet Commonly known as:  XARELTO Take 1 tablet (15 mg total) by mouth daily with supper.   timolol 0.5 % ophthalmic solution Commonly known as:  TIMOPTIC Place 1 drop into the right eye 2 (two) times daily.   vitamin B-12 1000 MCG tablet Commonly known as:  CYANOCOBALAMIN Take 1,000 mcg by mouth 2 (two) times daily.   Vitamin D3 75 MCG (3000 UT) Tabs Take 3,000 Units by mouth daily.           Objective:   Physical Exam BP 126/64 (BP Location: Left Arm, Patient Position: Sitting, Cuff Size: Small)   Pulse 82   Temp 98.2 F (36.8 C) (Oral)   Resp 16   Ht 5' 4"  (1.626 m)   Wt 167 lb (75.8 kg)   SpO2 95%   BMI 28.67 kg/m  General:   Well developed, NAD, BMI noted. HEENT:  Normocephalic . Face symmetric, atraumatic Neck: JVD is slightly elevated at 45 degrees. Lungs:  Few dry crackles at bases Normal respiratory effort, no intercostal retractions, no accessory muscle use. Heart: RRR,  no murmur.  Trace pretibial edema bilaterally  Skin: Not pale. Not jaundice Neurologic:  alert & oriented X3.  Speech normal, gait appropriate for age and  unassisted Psych--  Cognition and judgment appear intact.  Cooperative with normal attention span and concentration.  Behavior appropriate. No anxious or depressed appearing.      Assessment      Assessment   HTN severe CKD: creat ~1.4, 1.5 Dyslipidemia (not checking labs, patient refused medications) Hematology: --ITP-- hematology visit for 11-05-14, released to the care of PCP , check CBCs every 4 months, consult hematology if platelets <50 K --Pernicious anemia --Anemia of chronic disease CV: --CAD --Tako Tsubo cardiomyopathy --Diastolic  CHF, resolved, Dr Missy Sabins --AV block first-degree ; Paroxysmal Afib with RVR/Tachybrady syndrome s/p admission: implanted aSt Jude PPM 11/06/16 DJD: --Compression fractures --Lumbar stenosis Dr. Carloyn Manner --Multiple pelvic fractures , cervical spine compression  fracture 2013 [facility] --H/o nonhealing rib  --Osteoporosis: Dr. Agapito Games; s/p forteo, s/p  Prolia 09-2015. Last DEXA 09-29-16 OPHT: --Glaucoma  --S/p cornea transplant -- mac degeneration h/o granuloma annulare   H/o pseudomembranous colitis 2013  PLAN: Acute on chronic CHF: The patient is here for follow-up, she feels well, I did notice weight gain, JVD is a slightly elevated (she has some dry crackles at the bases although crackles have been noted before). Plan: Check a BMP, increase Lasix 40 mg to 1 tablet daily x5 days, then go back to her normal half tablet daily.  Monitor weights.  If her weights are not decreasing closer to 155 she is to let me know. HTN: Currently on carvedilol, we are increasing Lasix temporarily, watch for low blood pressures. Mild hyperglycemia: Check A1c. Chills, myalgias: Had 24-hour episodes of chills, myalgias without cough or urinary symptoms.  Virus?.  She is back to normal.  Observation See last visit, reports no unusual fatigue today RTC 3 months

## 2018-09-07 NOTE — Assessment & Plan Note (Signed)
Acute on chronic CHF: The patient is here for follow-up, she feels well, I did notice weight gain, JVD is a slightly elevated (she has some dry crackles at the bases although crackles have been noted before). Plan: Check a BMP, increase Lasix 40 mg to 1 tablet daily x5 days, then go back to her normal half tablet daily.  Monitor weights.  If her weights are not decreasing closer to 155 she is to let me know. HTN: Currently on carvedilol, we are increasing Lasix temporarily, watch for low blood pressures. Mild hyperglycemia: Check A1c. Chills, myalgias: Had 24-hour episodes of chills, myalgias without cough or urinary symptoms.  Virus?.  She is back to normal.  Observation See last visit, reports no unusual fatigue today RTC 3 months

## 2018-10-04 ENCOUNTER — Encounter (HOSPITAL_COMMUNITY): Payer: Medicare Other | Admitting: Internal Medicine

## 2018-11-03 ENCOUNTER — Ambulatory Visit: Payer: Medicare Other | Admitting: *Deleted

## 2018-11-08 ENCOUNTER — Ambulatory Visit (INDEPENDENT_AMBULATORY_CARE_PROVIDER_SITE_OTHER): Payer: Medicare Other | Admitting: *Deleted

## 2018-11-08 ENCOUNTER — Other Ambulatory Visit: Payer: Self-pay

## 2018-11-08 DIAGNOSIS — I442 Atrioventricular block, complete: Secondary | ICD-10-CM

## 2018-11-08 DIAGNOSIS — R001 Bradycardia, unspecified: Secondary | ICD-10-CM

## 2018-11-08 LAB — CUP PACEART REMOTE DEVICE CHECK
Battery Remaining Longevity: 118 mo
Battery Remaining Percentage: 95.5 %
Battery Voltage: 3.01 V
Brady Statistic AP VP Percent: 1 %
Brady Statistic AP VS Percent: 1 %
Brady Statistic AS VP Percent: 12 %
Brady Statistic AS VS Percent: 87 %
Brady Statistic RA Percent Paced: 1 %
Brady Statistic RV Percent Paced: 12 %
Date Time Interrogation Session: 20200428060014
Implantable Lead Implant Date: 20180427
Implantable Lead Implant Date: 20180427
Implantable Lead Location: 753859
Implantable Lead Location: 753860
Implantable Pulse Generator Implant Date: 20180427
Lead Channel Impedance Value: 430 Ohm
Lead Channel Impedance Value: 460 Ohm
Lead Channel Pacing Threshold Amplitude: 0.5 V
Lead Channel Pacing Threshold Amplitude: 1 V
Lead Channel Pacing Threshold Pulse Width: 0.5 ms
Lead Channel Pacing Threshold Pulse Width: 0.5 ms
Lead Channel Sensing Intrinsic Amplitude: 0.8 mV
Lead Channel Sensing Intrinsic Amplitude: 10.9 mV
Lead Channel Setting Pacing Amplitude: 2.5 V
Lead Channel Setting Pacing Amplitude: 2.5 V
Lead Channel Setting Pacing Pulse Width: 0.5 ms
Lead Channel Setting Sensing Sensitivity: 2 mV
Pulse Gen Model: 2272
Pulse Gen Serial Number: 8900487

## 2018-11-16 ENCOUNTER — Ambulatory Visit (HOSPITAL_COMMUNITY)
Admission: RE | Admit: 2018-11-16 | Discharge: 2018-11-16 | Disposition: A | Discharge status: Home / Self Care | Payer: Medicare Other | Source: Ambulatory Visit | Attending: Internal Medicine | Admitting: Internal Medicine

## 2018-11-16 ENCOUNTER — Other Ambulatory Visit: Payer: Self-pay

## 2018-11-16 DIAGNOSIS — I2510 Atherosclerotic heart disease of native coronary artery without angina pectoris: Secondary | ICD-10-CM

## 2018-11-16 DIAGNOSIS — I5032 Chronic diastolic (congestive) heart failure: Secondary | ICD-10-CM | POA: Diagnosis not present

## 2018-11-16 DIAGNOSIS — I10 Essential (primary) hypertension: Secondary | ICD-10-CM

## 2018-11-16 DIAGNOSIS — I4819 Other persistent atrial fibrillation: Secondary | ICD-10-CM

## 2018-11-16 NOTE — Progress Notes (Signed)
Spoke w/pt via phone, she is aware, agreeable and verbalizes understanding of increasing Lasix for 3 days.  Called Tiffany w/[facility] and gave referral, they will go and see pt for VS, ReDS and general assessment and call us with readings.  Follow up appt sch for 5/14 at 1:40.

## 2018-11-16 NOTE — Addendum Note (Signed)
Encounter addended by: Scarlette Calico, RN on: 11/16/2018 11:40 AM  Actions taken: Clinical Note Signed

## 2018-11-16 NOTE — Progress Notes (Signed)
Heart Failure TeleHealth Note  Due to national recommendations of social distancing due to [place] 19, Audio/video telehealth visit is felt to be most appropriate for this patient at this time.  See MyChart message from today for patient consent regarding telehealth for [facility].  Date:  11/16/2018   ID:  [name], DOB [birth year], MRN [id number]  Location: Home  Provider location: [facility] Type of Visit: Established patient  PCP:  Colon Branch, MD  Cardiologist:  Glori Bickers, MD Primary HF: [name] EP: Lovena Le and AF Clinic  Chief Complaint: Heart Failure follow-up   History of Present Illness:  [name] is a 85 y.o. 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 LHC in 6/05 in setting of NSTEMI: ant apical AK and inf-apical AK, EF 29%, dOM2 70-80% (small). EF has recovered. Also has a h/o HTN, permanent AF with tachy-brady syndrome s/p PPM and fractured pelvis 05/2012.  Admitted 4/27-4/28/18 for evaluation of tachycardia. Had recent holter monitor that showed 6.5 second pause. Coreg stopped and referred to EP. She then developed Afib RVR. With pause, PPM was placed 11/06/16 by Dr. Lovena Le.  Noted to be in Afib with controlled ventricular rate on discharge.   She presents via Engineer, civil (consulting) for a telehealth visit today.  She had been recently followed by the General Cardiology team and AF Clinic. Says she is not feeling well. Has been short of breath and swollen for several weeks. Feels like her fluid pills are not working well. Says feet and legs very swollen and tight. Not able to weight herself. No bleeding with Xarelto.   01/2012 ECHO EF 55% 08/14/13 ECHO EF 55%, normal RV size and systolic function.   Labs (1/15): K 4.3, creatinine 1.8, BNP 49 Labs (6/17): K 4.5, creatinine 1.14 Labs (9/17): K 4.5, Creatinine 1.38 Labs (10/12/16) LK 4.3, Creatinine 1.17   Aysel F Vasconcelos denies symptoms  worrisome for COVID 19.   Past Medical History:  Diagnosis Date  . AV block, 1st degree   . CAD (coronary artery disease)    Non ST elevation 2005 ; LHC in 6/05 in setting of NSTEMI: ant apical AK and inf-apical AK, EF 29%, dOM2 70-80% (small); findings c/w Tako-Tsubo CM  . Cardiomyopathy, nonischemic ([place]) 04/2008   Likely Tako-Tsubo. Resolved. --dx'd on cath 2005. EF 29% with minimal distal CAD (small OM1 70-80).  Echo 10/09 EF 55%;  echo 01/26/12: EF 55%, mild LAE, PASP 34.  Marland Kitchen CHB (complete heart block) (HCC)    Intermittent, s/p Saint Jude PPM  . Colitis, ischemic ([place])   . Complication of anesthesia    post anesthesia excessive somnulence  . Compression fracture of C-spine ([place]) 10/10/2011   Fell at home, tx at [facility]  . Diarrhea associated with pseudomembranous colitis 6/11-17/2013   [facility]  . Dyslipidemia   . Fall    with non-healing rib fractures  . Glaucoma   . Granuloma annulare   . History of phlebitis   . Hypertension    SEVERE  . Idiopathic thrombocytopenic purpura (ITP) (HCC)   . Lower extremity edema   . Lumbar stenosis 2004   DR. MARK ROY  . Multiple pelvic fractures 05/2012   Dr Adaline Sill , [facility]  . Osteoporosis    Past Surgical History:  Procedure Laterality Date  . ABDOMINAL HYSTERECTOMY    . APPENDECTOMY    . BACK SURGERY  10/2004   LS Disc 4-5 no  sx.  . CARDIAC CATHETERIZATION     12/2003  . CATARACT EXTRACTION, BILATERAL    . COLONOSCOPY  09/2005   adenoma  . COLONOSCOPY W/ POLYPECTOMY    . 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 PACEMAKER IMPLANT N/A 11/06/2016   Procedure: St Jude Pacemaker Implant;  Surgeon: Evans Lance, MD;  Location: [place] [facility];  Service: Cardiovascular;  Laterality: N/A;  . rectal bleed  05-01-06   colonoscopy-ischemic colitis  . ROTATOR CUFF REPAIR    . VERTEBROPLASTY     Dr Gladstone Lighter     Current Outpatient Medications  Medication Sig Dispense Refill  . beta carotene w/minerals (OCUVITE) tablet Take  1 tablet by mouth daily.     . brimonidine (ALPHAGAN) 0.2 % ophthalmic solution Place 1 drop into the right eye 2 (two) times daily.      . Calcium Carbonate-Vit D-Min (CALCIUM 1200 PO) Take 1,200 mg by mouth daily with breakfast.    . carboxymethylcellulose (REFRESH TEARS) 0.5 % SOLN Place 1 drop into both eyes 2 (two) times daily.     . carvedilol (COREG) 12.5 MG tablet Take 1 1/2 tablet twice daily (18.75 mg total) 90 tablet 3  . Cholecalciferol (VITAMIN D3) 3000 UNITS TABS Take 3,000 Units by mouth daily.     . diphenhydrAMINE (BENADRYL) 25 MG tablet Take 25 mg by mouth at bedtime as needed for sleep (and sinus issues).     . docusate sodium (COLACE) 100 MG capsule Take 100 mg by mouth daily as needed for mild constipation. Stool softener     . furosemide (LASIX) 40 MG tablet Take 0.5 tablets (20 mg total) by mouth daily.    . Misc Natural Products (OSTEO BI-FLEX ADV JOINT SHIELD) TABS Take 1 tablet by mouth daily.     Marland Kitchen omeprazole (PRILOSEC) 20 MG capsule Take 20 mg by mouth daily.      . Rivaroxaban (XARELTO) 15 MG TABS tablet Take 1 tablet (15 mg total) by mouth daily with supper. 90 tablet 3  . timolol (TIMOPTIC) 0.5 % ophthalmic solution Place 1 drop into the right eye 2 (two) times daily.  5  . vitamin B-12 (CYANOCOBALAMIN) 1000 MCG tablet Take 1,000 mcg by mouth 2 (two) times daily.      No current facility-administered medications for this encounter.     Allergies:   Morphine and related; Oxycodone; Penicillins; Clindamycin/lincomycin; Colchicine; Norvasc [amlodipine besylate]; Ofloxacin; Streptomycin; Tape; Tramadol; Alendronate sodium; Amiodarone; and Plaquenil [hydroxychloroquine sulfate]   Social History:  The patient  reports that she has never smoked. She has never used smokeless tobacco. She reports that she does not drink alcohol or use drugs.   Family History:  The patient's family history includes Breast cancer in her sister and sister; Colon cancer in her father; Coronary  artery disease in her mother; Heart failure in her mother; Kidney disease in her mother; Rectal cancer in her sister; Renal cancer in her mother; Stroke in her father.   ROS:  Please see the history of present illness.   All other systems are personally reviewed and negative.   Exam:  (Video/Tele Health Call; Exam is subjective and or/visual.) General:  Speaks in full sentences but breathless Lungs: Normal respiratory effort with conversation.  Abdomen: Non-distended per patient report Extremities: Pt endorses significant BLE edema. Neuro: Alert & oriented x 3.   Recent Labs: 06/16/2018: Hemoglobin 10.8; Platelets 61 06/17/2018: Magnesium 1.9 06/23/2018: B Natriuretic Peptide 187.7 09/06/2018: BUN 22; Creatinine, Ser  1.41; Potassium 4.1; Sodium 141  Personally reviewed   Wt Readings from Last 3 Encounters:  09/06/18 75.8 kg (167 lb)  07/12/18 71.9 kg (158 lb 9.6 oz)  06/29/18 71.8 kg (158 lb 6 oz)      ASSESSMENT AND PLAN:  1. Chronic diastolic CHF: History of Takotsubo cardiomyopathy, EF has recovered. - Echo 08/14/13 EF 55-60% with normal RV.  - Volume status seems quite elevated. Will have her double lasix to 80mg  daily for next 3 days. Will arrange for [facility] to see her in am at home. Can add metolazone as needed. If SOB worse call 911 - Repeat televisit with me next wee - Reinforced fluid restriction to < 2 L daily, sodium restriction to less than 2000 mg daily, and the importance of daily weights.   2. CAD:  - Minimal branch vessel disease on cath  - No s/s ischemia - Off ASA in setting of Xarelto. Has refused statins. 3. Permanent AF with Tachybrady syndrome s/p St Jude PPM 11/06/16 Regular pulse. This patients CHA2DS2-VASc Score and unadjusted Ischemic Stroke Rate (% per year) is equal to 9.7 % stroke rate/year from a score of 6  - Followed by Dr. Lovena Le and AF [place] 4.  HTN  - she says BP much improved. [facility] to check.   COVID screen The patient  does not have any symptoms that suggest any further testing/ screening at this time.  Social distancing reinforced today.  Recommended follow-up:  As above  Relevant cardiac medications were reviewed at length with the patient today.   The patient does not have concerns regarding their medications at this time.   The following changes were made today:  As above  Today, I have spent 18 minutes with the patient with telehealth technology discussing the above issues .    Signed, Glori Bickers, MD  11/16/2018 10:28 AM  [facility] [place] [id number] [phone] (office) [phone] (fax)

## 2018-11-16 NOTE — Patient Instructions (Signed)
Increase Furosemide to 80 mg daily for 3 days   [facility] will call you and schedule a time they will come out and see you  Follow up telehealth visit sch for [facility] 5/14 at 1:40 with Dr Haroldine Laws

## 2018-11-16 NOTE — Progress Notes (Signed)
Remote pacemaker transmission.   

## 2018-11-17 ENCOUNTER — Telehealth (HOSPITAL_COMMUNITY): Payer: Self-pay

## 2018-11-17 ENCOUNTER — Other Ambulatory Visit (HOSPITAL_COMMUNITY): Payer: Self-pay | Admitting: *Deleted

## 2018-11-17 DIAGNOSIS — I429 Cardiomyopathy, unspecified: Secondary | ICD-10-CM

## 2018-11-17 NOTE — Telephone Encounter (Signed)
Received call from patient inquiring about home visits from [place]. called [facility], they report they need a new referral, order and demographics faxed to them as patient was lost to f/u and ultimately declined services in January.  New order and referral to be faxed by Nira Conn, RN.  They will contact patient to schedule home visit. Pt made aware they will call her.

## 2018-11-24 ENCOUNTER — Ambulatory Visit (HOSPITAL_COMMUNITY)
Admission: RE | Admit: 2018-11-24 | Discharge: 2018-11-24 | Disposition: A | Discharge status: Home / Self Care | Payer: Medicare Other | Source: Ambulatory Visit | Attending: Internal Medicine | Admitting: Internal Medicine

## 2018-11-24 ENCOUNTER — Other Ambulatory Visit: Payer: Self-pay

## 2018-11-24 DIAGNOSIS — I5032 Chronic diastolic (congestive) heart failure: Secondary | ICD-10-CM

## 2018-11-24 DIAGNOSIS — I10 Essential (primary) hypertension: Secondary | ICD-10-CM

## 2018-11-24 MED ORDER — FUROSEMIDE 80 MG PO TABS: 80.0000 mg | ORAL_TABLET | Freq: Every day | ORAL | 3 refills | Status: DC

## 2018-11-24 MED ORDER — METOLAZONE 2.5 MG PO TABS: 2.5000 mg | ORAL_TABLET | ORAL | 3 refills | Status: DC

## 2018-11-24 MED ORDER — POTASSIUM CHLORIDE CRYS ER 20 MEQ PO TBCR: 20.0000 meq | EXTENDED_RELEASE_TABLET | ORAL | 3 refills | Status: DC

## 2018-11-24 NOTE — Patient Instructions (Addendum)
Lab work will need to be done next week. Our [facility] Nurse will be contacting you in order to set up a visit. She will also be doing ReDS vest reading on you. This allows [place] to assess how much fluid you have.  INCREASE Furosemide to 80mg  (1 tab) daily  START Metolazone 2.5mg  tab as directed by the heart failure clinic only. Please take 1 tab tomorrow 11/25/18 and 1 tab 11/26/18.  START Potassium 30meq (2 tabs) EVERY DAY YOU TAKE Metolazone.   Please follow up with Dr. Haroldine Laws in 1 week. This is scheduled for May 26th at 3:20pm

## 2018-11-24 NOTE — Progress Notes (Signed)
Heart Failure TeleHealth Note  Due to national recommendations of social distancing due to [place] 19, Audio/video telehealth visit is felt to be most appropriate for this patient at this time.  See MyChart message from today for patient consent regarding telehealth for [name].  Date:  11/24/2018   ID:  [name], DOB Sep 01, 1922, MRN [id number]  Location: Home  Provider location: [facility] Type of Visit: Established patient  PCP:  Colon Branch, MD  Cardiologist:  Glori Bickers, MD Primary HF: Anselm Aumiller EP: [facility] Le [facility]  Chief Complaint: Heart Failure follow-up   History of Present Illness:  [name] is a 85 y.o. 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 LHC in 6/05 in setting of NSTEMI: ant apical AK and inf-apical AK, EF 29%, dOM2 70-80% (small). EF has recovered. Also has a h/o HTN, permanent AF with tachy-brady syndrome s/p PPM and fractured pelvis 05/2012.  Admitted 4/27-4/28/18 for evaluation of tachycardia. Had recent holter monitor that showed 6.5 second pause. Coreg stopped and referred to EP. She then developed Afib RVR. With pause, PPM was placed 11/06/16 by Dr. Lovena Le.  Noted to be in Afib with controlled ventricular rate on discharge.   She presents via [employer], civil (consulting) for a telehealth visit today. We had a call last week and she was volume overloaded. We increased lasix to 80 daily and she has a small benefit but now back to 40 daily. Weight up from 167 to 170. Says goal weight is 159. [facility] team has been coming to check on her. Having a lot of LE edema. Still SOB with mild activity. Some orthopnea when she first lies down but then goes away.  No bleeding with Xarelto.   01/2012 ECHO EF 55% 08/14/13 ECHO EF 55%, normal RV size and systolic function.   Labs (1/15): K 4.3, creatinine 1.8, BNP 49 Labs (6/17): K 4.5, creatinine 1.14 Labs (9/17): K 4.5, Creatinine 1.38 Labs  (10/12/16) LK 4.3, Creatinine 1.17   Costella F Shetterly denies symptoms worrisome for COVID 19.   Past Medical History:  Diagnosis Date  . AV block, 1st degree   . CAD (coronary artery disease)    Non ST elevation 2005 ; LHC in 6/05 in setting of NSTEMI: ant apical AK and inf-apical AK, EF 29%, dOM2 70-80% (small); findings c/w Tako-Tsubo CM  . Cardiomyopathy, nonischemic ([place]) 04/2008   Likely Tako-Tsubo. Resolved. --dx'd on cath 2005. EF 29% with minimal distal CAD (small OM1 70-80).  Echo 10/09 EF 55%;  echo 01/26/12: EF 55%, mild LAE, PASP 34.  Marland Kitchen CHB (complete heart block) (HCC)    Intermittent, s/p Saint Jude PPM  . Colitis, ischemic ([place])   . Complication of anesthesia    post anesthesia excessive somnulence  . Compression fracture of C-spine ([place]) 10/10/2011   Fell at home, tx at [facility]  . Diarrhea associated with pseudomembranous colitis 6/11-17/2013   [facility]  . Dyslipidemia   . Fall    with non-healing rib fractures  . Glaucoma   . Granuloma annulare   . History of phlebitis   . Hypertension    SEVERE  . Idiopathic thrombocytopenic purpura (ITP) (HCC)   . Lower extremity edema   . Lumbar stenosis 2004   DR. MARK ROY  . Multiple pelvic fractures 05/2012   Dr Adaline Sill , [facility]  . Osteoporosis    Past Surgical History:  Procedure Laterality Date  . ABDOMINAL  HYSTERECTOMY    . APPENDECTOMY    . BACK SURGERY  10/2004   LS Disc 4-5 no sx.  Marland Kitchen CARDIAC CATHETERIZATION     12/2003  . CATARACT EXTRACTION, BILATERAL    . COLONOSCOPY  09/2005   adenoma  . COLONOSCOPY W/ POLYPECTOMY    . 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 PACEMAKER IMPLANT N/A 11/06/2016   Procedure: St Jude Pacemaker Implant;  Surgeon: Evans Lance, MD;  Location: [facility];  Service: Cardiovascular;  Laterality: N/A;  . rectal bleed  05-01-06   colonoscopy-ischemic colitis  . ROTATOR CUFF REPAIR    . VERTEBROPLASTY     Dr Gladstone Lighter     Current Outpatient Medications   Medication Sig Dispense Refill  . beta carotene w/minerals (OCUVITE) tablet Take 1 tablet by mouth daily.     . brimonidine (ALPHAGAN) 0.2 % ophthalmic solution Place 1 drop into the right eye 2 (two) times daily.      . Calcium Carbonate-Vit D-Min (CALCIUM 1200 PO) Take 1,200 mg by mouth daily with breakfast.    . carboxymethylcellulose (REFRESH TEARS) 0.5 % SOLN Place 1 drop into both eyes 2 (two) times daily.     . carvedilol (COREG) 12.5 MG tablet Take 1 1/2 tablet twice daily (18.75 mg total) 90 tablet 3  . Cholecalciferol (VITAMIN D3) 3000 UNITS TABS Take 3,000 Units by mouth daily.     . diphenhydrAMINE (BENADRYL) 25 MG tablet Take 25 mg by mouth at bedtime as needed for sleep (and sinus issues).     . docusate sodium (COLACE) 100 MG capsule Take 100 mg by mouth daily as needed for mild constipation. Stool softener     . furosemide (LASIX) 40 MG tablet Take 0.5 tablets (20 mg total) by mouth daily.    . Misc Natural Products (OSTEO BI-FLEX ADV JOINT SHIELD) TABS Take 1 tablet by mouth daily.     Marland Kitchen omeprazole (PRILOSEC) 20 MG capsule Take 20 mg by mouth daily.      . Rivaroxaban (XARELTO) 15 MG TABS tablet Take 1 tablet (15 mg total) by mouth daily with supper. 90 tablet 3  . timolol (TIMOPTIC) 0.5 % ophthalmic solution Place 1 drop into the right eye 2 (two) times daily.  5  . vitamin B-12 (CYANOCOBALAMIN) 1000 MCG tablet Take 1,000 mcg by mouth 2 (two) times daily.      No current facility-administered medications for this encounter.     Allergies:   Morphine and related; Oxycodone; Penicillins; Clindamycin/lincomycin; Colchicine; Norvasc [amlodipine besylate]; Ofloxacin; Streptomycin; Tape; Tramadol; Alendronate sodium; Amiodarone; and Plaquenil [hydroxychloroquine sulfate]   Social History:  The patient  reports that she has never smoked. She has never used smokeless tobacco. She reports that she does not drink alcohol or use drugs.   Family History:  The patient's family history  includes Breast cancer in her sister and sister; Colon cancer in her father; Coronary artery disease in her mother; Heart failure in her mother; Kidney disease in her mother; Rectal cancer in her sister; Renal cancer in her mother; Stroke in her father.   ROS:  Please see the history of present illness.   All other systems are personally reviewed and negative.   Exam:  (Video/Tele Health Call; Exam is subjective and or/visual.) General:  Speaks in full sentences but breathless Lungs: Normal respiratory effort with conversation.  Abdomen: Non-distended per patient report Extremities: Pt endorses significant BLE edema. Neuro: Alert & oriented x 3.  Recent Labs: 06/16/2018: Hemoglobin 10.8; Platelets 61 06/17/2018: Magnesium 1.9 06/23/2018: B Natriuretic Peptide 187.7 09/06/2018: BUN 22; Creatinine, Ser 1.41; Potassium 4.1; Sodium 141  Personally reviewed   Wt Readings from Last 3 Encounters:  09/06/18 75.8 kg (167 lb)  07/12/18 71.9 kg (158 lb 9.6 oz)  06/29/18 71.8 kg (158 lb 6 oz)      ASSESSMENT AND PLAN:  1. Chronic diastolic CHF: History of Takotsubo cardiomyopathy, EF has recovered. - Echo 08/14/13 EF 55-60% with normal RV.  - Volume status seems to remain quite elevated. Will have her double lasix to 80mg  daily and keep it there. Will arrange for [facility] to see her in am at home. Can add metolazone as needed. If SOB worse call 911 - Repeat televisit with me next week - Reinforced fluid restriction to < 2 L daily, sodium restriction to less than 2000 mg daily, and the importance of daily weights.   2. CAD:  - Minimal branch vessel disease on cath  - No s/s ischemia - Off ASA in setting of Xarelto. Has refused statins. 3. Permanent AF with Tachybrady syndrome s/p St Jude PPM 11/06/16 Regular pulse. This patients CHA2DS2-VASc Score and unadjusted Ischemic Stroke Rate (% per year) is equal to 9.7 % stroke rate/year from a score of 6  - Followed by Dr. Lovena Le and AF [facility] 4.  HTN  - she says BP much improved. [facility] to f/u  COVID screen The patient does not have any symptoms that suggest any further testing/ screening at this time.  Social distancing reinforced today.  Recommended follow-up:  As above  Relevant cardiac medications were reviewed at length with the patient today.   The patient does not have concerns regarding their medications at this time.   The following changes were made today:  As above  Today, I have spent 17 minutes with the patient with telehealth technology discussing the above issues .    Signed, Glori Bickers, MD  11/24/2018 1:53 PM  [facility] [address] [facility] and [place] [address] [phone] (office) [phone] (fax)

## 2018-11-24 NOTE — Addendum Note (Signed)
Encounter addended by: Marlise Eves, RN on: 11/24/2018 2:53 PM  Actions taken: Pharmacy for encounter modified, Clinical Note Signed, Order list changed, Diagnosis association updated

## 2018-11-25 NOTE — Progress Notes (Signed)
Spoke to pt. Pt agreeable and verbalized understanding. No further needs at this time.

## 2018-11-25 NOTE — Addendum Note (Signed)
Encounter addended by: Marlise Eves, RN on: 11/25/2018 8:40 AM  Actions taken: Clinical Note Signed

## 2018-11-28 ENCOUNTER — Ambulatory Visit (HOSPITAL_COMMUNITY)
Admission: RE | Admit: 2018-11-28 | Discharge: 2018-11-28 | Disposition: A | Discharge status: Home / Self Care | Payer: Medicare Other | Source: Ambulatory Visit | Attending: Cardiology | Admitting: Cardiology

## 2018-11-28 ENCOUNTER — Other Ambulatory Visit: Payer: Self-pay

## 2018-11-28 ENCOUNTER — Telehealth (HOSPITAL_COMMUNITY): Payer: Self-pay | Admitting: Cardiology

## 2018-11-28 DIAGNOSIS — I5032 Chronic diastolic (congestive) heart failure: Secondary | ICD-10-CM | POA: Diagnosis not present

## 2018-11-28 NOTE — Telephone Encounter (Signed)
Received call from Vanita Ingles, RN with [facility]. She is doing a home visit to check vitals, ReDS, and BMET.  She took metolazone 2.5 mg Friday and Saturday, but she did not take lasix on those days. She did not have much improvement in symptoms or weight. She resumed lasix 40 mg daily (instead of prescribed 80 mg daily) on Sunday and weight dropped 3 lbs down to 167 lbs. She still has bilateral pitting edema, but it has improved. She remains SOB on exertion, but this is also improved.  ReDS reading is 32% today. BP 122/80, HR 84, O2 96% on RA, weight 167 lbs  Increase lasix to 80 mg daily as previously prescribed. Will see what K is today on labs. I will have [facility] wrap her legs with UNNA boots. She has f/u with Dr Haroldine Laws next week.

## 2018-11-28 NOTE — Progress Notes (Signed)
[name]       DOB: 1922/11/09  Purpose of Visit: Home Visit for ReDS clip and BMET HF provider:  [facility]  Medications: Is the patient taking all medications listed on MAR from Epic? Yes  List any medications that are not being taken correctly:  List any medication refills needed: none  Is the patient able to pick up medications? Yes  Vitals: BP:  122/80   HR: 84 irregular   Oxygen:  96%RA Weight:  167lbs        Physical Exam:  Lung sounds:   Heart sounds: irregular  Peripheral edema: yes 2+/3+ BLE  Wounds: no  Location:  Any patient concerns?  [name] says that she took metolazone as Rx'd but didn't notice a change.  She did not take furosemide during that same time.  She resumed her lasix yesterday (Sunday) and did have a wt loss of 3 lbs (170lbs to 167lbs).  She says she does feel better and can tell a difference in her swelling as it has decreased some.  She does state she gets shob w/exertion but none at rest but overall it has improved.  She says that prior to the Covid-19 pandemic she was more active having run errands to the store or on walks with friends.  She says she can tell that her legs have become more swollen and weaker since she isn't able to do these things.  I've encouraged her to walk her hallway in her home as she can several times a day, using her cane as needed.   Spoke to [place], NP at the HF clinic while I was at the home to report the findings.  [name] is to take Lasix 80mg  daily and will have a virtual visit w/Dr. Haroldine Laws later this week for reassessment.  She stated understanding. Also a [facility] nurse will be ordered to come out to wrap her legs d/t the swelling.   ReDS Vest/Clip Reading:  32%  Rhythm Strip:  Is Home Health recommended? Yes, [facility] will come to wrap her legs. If yes, state reason:   Vanita Ingles, RN 11/28/18

## 2018-11-30 LAB — BASIC METABOLIC PANEL
Anion gap: 9 (ref 5–15)
BUN: 19 mg/dL (ref 8–23)
CO2: 32 mmol/L (ref 22–32)
Calcium: 9 mg/dL (ref 8.9–10.3)
Chloride: 100 mmol/L (ref 98–111)
Creatinine, Ser: 1.43 mg/dL — ABNORMAL HIGH (ref 0.44–1.00)
GFR calc Af Amer: 36 mL/min — ABNORMAL LOW (ref >60)
GFR calc non Af Amer: 31 mL/min — ABNORMAL LOW (ref >60)
Glucose, Bld: 77 mg/dL (ref 70–99)
Potassium: 4.1 mmol/L (ref 3.5–5.1)
Sodium: 141 mmol/L (ref 135–145)

## 2018-12-06 ENCOUNTER — Ambulatory Visit (HOSPITAL_COMMUNITY)
Admission: RE | Admit: 2018-12-06 | Discharge: 2018-12-06 | Disposition: A | Discharge status: Home / Self Care | Payer: Medicare Other | Source: Ambulatory Visit | Attending: Internal Medicine | Admitting: Internal Medicine

## 2018-12-06 ENCOUNTER — Other Ambulatory Visit: Payer: Self-pay

## 2018-12-06 ENCOUNTER — Ambulatory Visit (INDEPENDENT_AMBULATORY_CARE_PROVIDER_SITE_OTHER): Payer: Medicare Other | Admitting: Internal Medicine

## 2018-12-06 ENCOUNTER — Telehealth: Payer: Self-pay | Admitting: Internal Medicine

## 2018-12-06 DIAGNOSIS — I10 Essential (primary) hypertension: Secondary | ICD-10-CM

## 2018-12-06 DIAGNOSIS — I2510 Atherosclerotic heart disease of native coronary artery without angina pectoris: Secondary | ICD-10-CM

## 2018-12-06 DIAGNOSIS — I5032 Chronic diastolic (congestive) heart failure: Secondary | ICD-10-CM

## 2018-12-06 DIAGNOSIS — M810 Age-related osteoporosis without current pathological fracture: Secondary | ICD-10-CM | POA: Diagnosis not present

## 2018-12-06 NOTE — Addendum Note (Signed)
Encounter addended by: Scarlette Calico, RN on: 12/06/2018 3:45 PM  Actions taken: Order list changed, Diagnosis association updated, Clinical Note Signed

## 2018-12-06 NOTE — Progress Notes (Signed)
Spoke w/pt, she is aware Vanita Ingles, RN w/[facility] will call her sch visit for early next week.  Ref placed for Debbie for labs and ReDS reading.  Follow up app sch for 6/22 at 2:20 telephone visit.  AVS mailed to pt

## 2018-12-06 NOTE — Telephone Encounter (Signed)
New message   Spoke w/pt about appt on 06.23.20 with Dr. Lovena Le. Pt was RS to 05.28.20 and will do a phone visit with Dr. Lovena Le. Pt phone number is listed in appt notes.       Virtual Visit Pre-Appointment Phone Call  "(Name), I am calling you today to discuss your upcoming appointment. We are currently trying to limit exposure to the virus that causes COVID-19 by seeing patients at home rather than in the office."  1. "What is the BEST phone number to call the day of the visit?" - include this in appointment notes  2. Do you have or have access to (through a family member/friend) a smartphone with video capability that we can use for your visit?" a. If yes - list this number in appt notes as cell (if different from BEST phone #) and list the appointment type as a VIDEO visit in appointment notes b. If no - list the appointment type as a PHONE visit in appointment notes  3. Confirm consent - "In the setting of the current Covid19 crisis, you are scheduled for a (phone or video) visit with your provider on (date) at (time).  Just as we do with many in-office visits, in order for you to participate in this visit, we must obtain consent.  If you'd like, I can send this to your mychart (if signed up) or email for you to review.  Otherwise, I can obtain your verbal consent now.  All virtual visits are billed to your insurance company just like a normal visit would be.  By agreeing to a virtual visit, we'd like you to understand that the technology does not allow for your provider to perform an examination, and thus may limit your provider's ability to fully assess your condition. If your provider identifies any concerns that need to be evaluated in person, we will make arrangements to do so.  Finally, though the technology is pretty good, we cannot assure that it will always work on either your or our end, and in the setting of a video visit, we may have to convert it to a phone-only visit.  In  either situation, we cannot ensure that we have a secure connection.  Are you willing to proceed?" STAFF: Did the patient verbally acknowledge consent to telehealth visit? Document YES/NO here: YES  4. Advise patient to be prepared - "Two hours prior to your appointment, go ahead and check your blood pressure, pulse, oxygen saturation, and your weight (if you have the equipment to check those) and write them all down. When your visit starts, your provider will ask you for this information. If you have an Apple Watch or Kardia device, please plan to have heart rate information ready on the day of your appointment. Please have a pen and paper handy nearby the day of the visit as well."  5. Give patient instructions for MyChart download to smartphone OR [email or web address] as below if video visit (depending on what platform provider is using)  6. Inform patient they will receive a phone call 15 minutes prior to their appointment time (may be from unknown caller ID) so they should be prepared to answer    TELEPHONE CALL NOTE  [name] has been deemed a candidate for a follow-up tele-health visit to limit community exposure during the Covid-19 pandemic. I spoke with the patient via phone to ensure availability of phone/video source, confirm preferred email & phone number, and discuss instructions and expectations.  I reminded [name] to be prepared with any vital sign and/or heart rhythm information that could potentially be obtained via home monitoring, at the time of her visit. I reminded [name] to expect a phone call prior to her visit.  [name] 12/06/2018 11:12 AM   INSTRUCTIONS FOR DOWNLOADING THE MYCHART APP TO SMARTPHONE  - The patient must first make sure to have activated MyChart and know their login information - If Apple, go to [employer] and type in MyChart in the search bar and download the app. If Android, ask patient to go to [employer] and  type in [place] in the search bar and download the app. The app is free but as with any other app downloads, their phone may require them to verify saved payment information or Apple/Android password.  - The patient will need to then log into the app with their MyChart username and password, and select [facility] as their healthcare provider to link the account. When it is time for your visit, go to the MyChart app, find appointments, and click Begin Video Visit. Be sure to Select Allow for your device to access the Microphone and Camera for your visit. You will then be connected, and your provider will be with you shortly.  **If they have any issues connecting, or need assistance please contact MyChart service desk [phone])**  **If using a computer, in order to ensure the best quality for their visit they will need to use either of the following Internet Browsers: [employer], or Google Chrome**  IF USING DOXIMITY or DOXY.ME - The patient will receive a link just prior to their visit by text.     FULL LENGTH CONSENT FOR TELE-HEALTH VISIT   I hereby voluntarily request, consent and authorize [facility] and its employed or contracted physicians, physician assistants, nurse practitioners or other licensed health care professionals (the Practitioner), to provide me with telemedicine health care services (the Services") as deemed necessary by the treating Practitioner. I acknowledge and consent to receive the Services by the Practitioner via telemedicine. I understand that the telemedicine visit will involve communicating with the Practitioner through live audiovisual communication technology and the disclosure of certain medical information by electronic transmission. I acknowledge that I have been given the opportunity to request an in-person assessment or other available alternative prior to the telemedicine visit and am voluntarily participating in the telemedicine  visit.  I understand that I have the right to withhold or withdraw my consent to the use of telemedicine in the course of my care at any time, without affecting my right to future care or treatment, and that the Practitioner or I may terminate the telemedicine visit at any time. I understand that I have the right to inspect all information obtained and/or recorded in the course of the telemedicine visit and may receive copies of available information for a reasonable fee.  I understand that some of the potential risks of receiving the Services via telemedicine include:   Delay or interruption in medical evaluation due to technological equipment failure or disruption;  Information transmitted may not be sufficient (e.g. poor resolution of images) to allow for appropriate medical decision making by the Practitioner; and/or   In rare instances, security protocols could fail, causing a breach of personal health information.  Furthermore, I acknowledge that it is my responsibility to provide information about my medical history, conditions and care that is complete and accurate to the best of my ability. I  acknowledge that Practitioner's advice, recommendations, and/or decision may be based on factors not within their control, such as incomplete or inaccurate data provided by me or distortions of diagnostic images or specimens that may result from electronic transmissions. I understand that the practice of medicine is not an exact science and that Practitioner makes no warranties or guarantees regarding treatment outcomes. I acknowledge that I will receive a copy of this consent concurrently upon execution via email to the email address I last provided but may also request a printed copy by calling the office of [facility].    I understand that my insurance will be billed for this visit.   I have read or had this consent read to me.  I understand the contents of this consent, which adequately explains  the benefits and risks of the Services being provided via telemedicine.   I have been provided ample opportunity to ask questions regarding this consent and the Services and have had my questions answered to my satisfaction.  I give my informed consent for the services to be provided through the use of telemedicine in my medical care  By participating in this telemedicine visit I agree to the above.

## 2018-12-06 NOTE — Patient Instructions (Signed)
Debbie, RN with [facility] will call you and schedule a time for her to come out to your home next week  Your physician recommends that you schedule a follow-up appointment in: Monday 6/22 at 2:20 TELEPHONE VISIT, Dr Haroldine Laws will call you

## 2018-12-06 NOTE — Progress Notes (Signed)
Heart Failure TeleHealth Note  Due to national recommendations of social distancing due to [place] 19, Audio/video telehealth visit is felt to be most appropriate for this patient at this time.  See MyChart message from today for patient consent regarding telehealth for [facility].  Date:  12/06/2018   ID:  [name], DOB 10-Dec-1922, MRN [id number]  Location: Home  Provider location: [facility] Type of Visit: Established patient  PCP:  Colon Branch, MD  Cardiologist:  Glori Bickers, MD Primary HF:  EP: Lovena Le [facility]  Chief Complaint: Heart Failure follow-up   History of Present Illness:  [name] is a 85 y.o. 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 LHC in 6/05 in setting of NSTEMI: ant apical AK and inf-apical AK, EF 29%, dOM2 70-80% (small). EF has recovered. Also has a h/o HTN, permanent AF with tachy-brady syndrome s/p PPM and fractured pelvis 05/2012.  Admitted 4/27-4/28/18 for evaluation of tachycardia. Had recent holter monitor that showed 6.5 second pause. Coreg stopped and referred to EP. She then developed Afib RVR. With pause, PPM was placed 11/06/16 by Dr. Lovena Le.  Noted to be in Afib with controlled ventricular rate on discharge.   She presents via Engineer, civil (consulting) for a telehealth visit today. We had a call 2 weeks ago and she seemed volume overloaded. I  Told her to increase lasix to 80 daily and we would send Vanita Ingles out for home visit. She took metolazone 2.5 mg Friday and Saturday, but she did not take lasix on those days. She did not have much improvement in symptoms or weight with metolazone. She resumed lasix 40 mg daily (instead of prescribed 80 mg daily) on Sunday and weight dropped 3 lbs down to 167 lbs. She still has bilateral pitting edema, but it has improved. ReDS reading on 5/18 was 32% with weight of 167. Lasix increased to 80 daily.   Says she feels  better since our last call. Edema much better. Now taking lasix 80 daily. Weight down 170 -> 162. SOB improved. Able to do ADLs. No dizziness. No orthopnea or PND.  Labs from 5/18 creatinine 1.4 k 4.1   01/2012 ECHO EF 55% 08/14/13 ECHO EF 55%, normal RV size and systolic function.   Labs (1/15): K 4.3, creatinine 1.8, BNP 49 Labs (6/17): K 4.5, creatinine 1.14 Labs (9/17): K 4.5, Creatinine 1.38 Labs (10/12/16) LK 4.3, Creatinine 1.17   [name] denies symptoms worrisome for COVID 19.   Past Medical History:  Diagnosis Date  . AV block, 1st degree   . CAD (coronary artery disease)    Non ST elevation 2005 ; LHC in 6/05 in setting of NSTEMI: ant apical AK and inf-apical AK, EF 29%, dOM2 70-80% (small); findings c/w Tako-Tsubo CM  . Cardiomyopathy, nonischemic ([place]) 04/2008   Likely Tako-Tsubo. Resolved. --dx'd on cath 2005. EF 29% with minimal distal CAD (small OM1 70-80).  Echo 10/09 EF 55%;  echo 01/26/12: EF 55%, mild LAE, PASP 34.  Marland Kitchen CHB (complete heart block) (HCC)    Intermittent, s/p Saint Jude PPM  . Colitis, ischemic ([place])   . Complication of anesthesia    post anesthesia excessive somnulence  . Compression fracture of C-spine ([place]) 10/10/2011   Fell at home, [facility]  . Diarrhea associated with pseudomembranous colitis 6/11-17/2013   [facility]  . Dyslipidemia   . Fall    with non-healing  rib fractures  . Glaucoma   . Granuloma annulare   . History of phlebitis   . Hypertension    SEVERE  . Idiopathic thrombocytopenic purpura (ITP) (HCC)   . Lower extremity edema   . Lumbar stenosis 2004   DR. MARK ROY  . Multiple pelvic fractures 05/2012   Dr Adaline Sill , [facility]  . Osteoporosis    Past Surgical History:  Procedure Laterality Date  . ABDOMINAL HYSTERECTOMY    . APPENDECTOMY    . BACK SURGERY  10/2004   LS Disc 4-5 no sx.  Marland Kitchen CARDIAC CATHETERIZATION     12/2003  . CATARACT EXTRACTION, BILATERAL    . COLONOSCOPY  09/2005   adenoma  . COLONOSCOPY  W/ POLYPECTOMY    . 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 PACEMAKER IMPLANT N/A 11/06/2016   Procedure: St Jude Pacemaker Implant;  Surgeon: Evans Lance, MD;  Location: [place] [facility];  Service: Cardiovascular;  Laterality: N/A;  . rectal bleed  05-01-06   colonoscopy-ischemic colitis  . ROTATOR CUFF REPAIR    . VERTEBROPLASTY     Dr Gladstone Lighter     Current Outpatient Medications  Medication Sig Dispense Refill  . beta carotene w/minerals (OCUVITE) tablet Take 1 tablet by mouth daily.     . brimonidine (ALPHAGAN) 0.2 % ophthalmic solution Place 1 drop into the right eye 2 (two) times daily.      . Calcium Carbonate-Vit D-Min (CALCIUM 1200 PO) Take 1,200 mg by mouth daily with breakfast.    . carboxymethylcellulose (REFRESH TEARS) 0.5 % SOLN Place 1 drop into both eyes 2 (two) times daily.     . carvedilol (COREG) 12.5 MG tablet Take 1 1/2 tablet twice daily (18.75 mg total) 90 tablet 3  . Cholecalciferol (VITAMIN D3) 3000 UNITS TABS Take 3,000 Units by mouth daily.     . diphenhydrAMINE (BENADRYL) 25 MG tablet Take 25 mg by mouth at bedtime as needed for sleep (and sinus issues).     . docusate sodium (COLACE) 100 MG capsule Take 100 mg by mouth daily as needed for mild constipation. Stool softener     . furosemide (LASIX) 80 MG tablet Take 1 tablet (80 mg total) by mouth daily. 90 tablet 3  . metolazone (ZAROXOLYN) 2.5 MG tablet Take 1 tablet (2.5 mg total) by mouth as directed. By the heart failure clinic 20 tablet 3  . Misc Natural Products (OSTEO BI-FLEX ADV JOINT SHIELD) TABS Take 1 tablet by mouth daily.     Marland Kitchen omeprazole (PRILOSEC) 20 MG capsule Take 20 mg by mouth daily.      . potassium chloride SA (K-DUR) 20 MEQ tablet Take 1 tablet (20 mEq total) by mouth as directed. By the heart failure clinic. 90 tablet 3  . Rivaroxaban (XARELTO) 15 MG TABS tablet Take 1 tablet (15 mg total) by mouth daily with supper. 90 tablet 3  . timolol (TIMOPTIC) 0.5 % ophthalmic solution Place 1  drop into the right eye 2 (two) times daily.  5  . vitamin B-12 (CYANOCOBALAMIN) 1000 MCG tablet Take 1,000 mcg by mouth 2 (two) times daily.      No current facility-administered medications for this encounter.     Allergies:   Morphine and related; Oxycodone; Penicillins; Clindamycin/lincomycin; Colchicine; Norvasc [amlodipine besylate]; Ofloxacin; Streptomycin; Tape; Tramadol; Alendronate sodium; Amiodarone; and Plaquenil [hydroxychloroquine sulfate]   Social History:  The patient  reports that she has never smoked. She has never used smokeless tobacco. She  reports that she does not drink alcohol or use drugs.   Family History:  The patient's family history includes Breast cancer in her sister and sister; Colon cancer in her father; Coronary artery disease in her mother; Heart failure in her mother; Kidney disease in her mother; Rectal cancer in her sister; Renal cancer in her mother; Stroke in her father.   ROS:  Please see the history of present illness.   All other systems are personally reviewed and negative.   Exam:  (Video/Tele Health Call; Exam is subjective and or/visual.) General:  Speaks in full sentences but breathless Lungs: Normal respiratory effort with conversation.  Abdomen: Non-distended per patient report Extremities: Pt endorses trace BLE edema. Neuro: Alert & oriented x 3.   Recent Labs: 06/16/2018: Hemoglobin 10.8; Platelets 61 06/17/2018: Magnesium 1.9 06/23/2018: B Natriuretic Peptide 187.7 11/28/2018: BUN 19; Creatinine, Ser 1.43; Potassium 4.1; Sodium 141  Personally reviewed   Wt Readings from Last 3 Encounters:  11/28/18 75.8 kg (167 lb)  09/06/18 75.8 kg (167 lb)  07/12/18 71.9 kg (158 lb 9.6 oz)      ASSESSMENT AND PLAN:  1. Chronic diastolic CHF: History of Takotsubo cardiomyopathy, EF has recovered. - Echo 08/14/13 EF 55-60% with normal RV.  - Volume status much improved. Continue lasix 80 daily. - Will have Vanita Ingles recheck ReDS and labs next  week - Repeat televisit with me 3-4 weeks - Reinforced fluid restriction to < 2 L daily, sodium restriction to less than 2000 mg daily, and the importance of daily weights.   2. CAD:  - Minimal branch vessel disease on cath  - No s/s ischemia - Off ASA in setting of Xarelto. Has refused statins. 3. Permanent AF with Tachybrady syndrome s/p St Jude PPM 11/06/16 Regular pulse. This patients CHA2DS2-VASc Score and unadjusted Ischemic Stroke Rate (% per year) is equal to 9.7 % stroke rate/year from a score of 6  - Followed by Dr. Lovena Le and [place]. Check CBC with BMET next week  4.  HTN  - she says BP much improved. Vanita Ingles to recheck next week.   COVID screen The patient does not have any symptoms that suggest any further testing/ screening at this time.  Social distancing reinforced today.  Recommended follow-up:  As above  Relevant cardiac medications were reviewed at length with the patient today.   The patient does not have concerns regarding their medications at this time.   The following changes were made today:  As above  Today, I have spent 18 minutes with the patient with telehealth technology discussing the above issues .    Signed, Glori Bickers, MD  12/06/2018 3:23 PM  [facility] [place] [address] [facility] [place] [address] [phone] (office) [phone] (fax)

## 2018-12-06 NOTE — Progress Notes (Signed)
Subjective:    Patient ID: [name], female    DOB: Oct 26, 1922, 85 y.o.   MRN: [id number]  DOS:  12/06/2018 Type of visit - description:  Attempted  to make this a video visit, due to technical difficulties from the patient side it was not possible  thus we proceeded with a Virtual Visit via Telephone    I connected with@ on 12/07/18 at  2:20 PM EDT by telephone and verified that I am speaking with the correct person using two identifiers.  THIS ENCOUNTER IS A VIRTUAL VISIT DUE TO COVID-19 - PATIENT WAS NOT SEEN IN THE OFFICE. PATIENT HAS CONSENTED TO VIRTUAL VISIT / TELEMEDICINE VISIT   Location of patient: home  Location of provider: office  I discussed the limitations, risks, security and privacy concerns of performing an evaluation and management service by telephone and the availability of in person appointments. I also discussed with the patient that there may be a patient responsible charge related to this service. The patient expressed understanding and agreed to proceed.   History of Present Illness: Routine visit In general, the patient stays is doing well. CHF: Managed by cardiology, see note from 11/24/2018.  Improving. osteoporosis: Not on Prolia   Review of Systems  Denies fever chills She remains slightly short of breath and tired but improving overall over the last 2 weeks. Had significant lower extremity edema: Improved in the last 2 weeks. Denies nausea, vomiting, diarrhea Has mild cough, at baseline.  Past Medical History:  Diagnosis Date  . AV block, 1st degree   . CAD (coronary artery disease)    Non ST elevation 2005 ; LHC in 6/05 in setting of NSTEMI: ant apical AK and inf-apical AK, EF 29%, dOM2 70-80% (small); findings c/w Tako-Tsubo CM  . Cardiomyopathy, nonischemic ([place]) 04/2008   Likely Tako-Tsubo. Resolved. --dx'd on cath 2005. EF 29% with minimal distal CAD (small OM1 70-80).  Echo 10/09 EF 55%;  echo 01/26/12: EF 55%, mild LAE, PASP 34.  Marland Kitchen  CHB (complete heart block) (HCC)    Intermittent, s/p Saint Jude PPM  . Colitis, ischemic ([place])   . Complication of anesthesia    post anesthesia excessive somnulence  . Compression fracture of C-spine ([place]) 10/10/2011   Fell at home, [facility]  . Diarrhea associated with pseudomembranous colitis 6/11-17/2013   [facility]  . Dyslipidemia   . Fall    with non-healing rib fractures  . Glaucoma   . Granuloma annulare   . History of phlebitis   . Hypertension    SEVERE  . Idiopathic thrombocytopenic purpura (ITP) (HCC)   . Lower extremity edema   . Lumbar stenosis 2004   DR. MARK ROY  . Multiple pelvic fractures 05/2012   Dr Adaline Sill , [facility]  . Osteoporosis     Past Surgical History:  Procedure Laterality Date  . ABDOMINAL HYSTERECTOMY    . APPENDECTOMY    . BACK SURGERY  10/2004   LS Disc 4-5 no sx.  Marland Kitchen CARDIAC CATHETERIZATION     12/2003  . CATARACT EXTRACTION, BILATERAL    . COLONOSCOPY  09/2005   adenoma  . COLONOSCOPY W/ POLYPECTOMY    . 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 PACEMAKER IMPLANT N/A 11/06/2016   Procedure: St Jude Pacemaker Implant;  Surgeon: Evans Lance, MD;  Location: [place] [facility];  Service: Cardiovascular;  Laterality: N/A;  . rectal bleed  05-01-06   colonoscopy-ischemic colitis  . ROTATOR  CUFF REPAIR    . VERTEBROPLASTY     Dr Gladstone Lighter    Social History   Socioeconomic History  . Marital status: Widowed    Spouse name: Not on file  . Number of children: 1  . Years of education: Not on file  . Highest education level: Not on file  Occupational History  . Occupation: retired    Fish farm manager: RETIRED  Social Needs  . Financial resource strain: Not on file  . Food insecurity:    Worry: Not on file    Inability: Not on file  . Transportation needs:    Medical: Not on file    Non-medical: Not on file  Tobacco Use  . Smoking status: Never Smoker  . Smokeless tobacco: Never Used  Substance and Sexual Activity  . Alcohol  use: No  . Drug use: No  . Sexual activity: Not Currently  Lifestyle  . Physical activity:    Days per week: Not on file    Minutes per session: Not on file  . Stress: Not on file  Relationships  . Social connections:    Talks on phone: Not on file    Gets together: Not on file    Attends religious service: Not on file    Active member of club or organization: Not on file    Attends meetings of clubs or organizations: Not on file    Relationship status: Not on file  . Intimate partner violence:    Fear of current or ex partner: Not on file    Emotionally abused: Not on file    Physically abused: Not on file    Forced sexual activity: Not on file  Other Topics Concern  . Not on file  Social History Narrative   Widowed   Lives by herself in a town house, drives    Son Lillias Difrancesco       Allergies as of 12/06/2018      Reactions   Morphine And Related Anaphylaxis, Nausea Only   Per family, made her violently ill   Oxycodone Other (See Comments)   Dizziness    Penicillins Swelling, Other (See Comments)   Joint edema Has patient had a PCN reaction causing immediate rash, facial/tongue/throat swelling, SOB or lightheadedness with hypotension: Yes Has patient had a PCN reaction causing severe rash involving mucus membranes or skin necrosis: No Has patient had a PCN reaction that required hospitalization: No Has patient had a PCN reaction occurring within the last 10 years: No If all of the above answers are "NO", then may proceed with Cephalosporin use.   Clindamycin/lincomycin Hives, Itching   Colchicine Other (See Comments)   Hair loss   Norvasc [amlodipine Besylate] Other (See Comments)   Reaction not recalled by the patient ??   Ofloxacin Other (See Comments)   Unknown   Streptomycin Other (See Comments)   "was a long time ago" reaction not recalled   Tape Other (See Comments)   SKIN IS VERY THIN AND WILL TEAR AND BRUISE EASILY!!   Tramadol Itching   Alendronate  Sodium Palpitations   Amiodarone Rash, Other (See Comments)   Possible rash and nervousness per patient   Plaquenil [hydroxychloroquine Sulfate] Rash      Medication List       Accurate as of Dec 06, 2018 11:59 PM. If you have any questions, ask your nurse or doctor.        beta carotene w/minerals tablet Take 1 tablet by mouth daily.  brimonidine 0.2 % ophthalmic solution Commonly known as:  ALPHAGAN Place 1 drop into the right eye 2 (two) times daily.   CALCIUM 1200 PO Take 1,200 mg by mouth daily with breakfast.   carvedilol 12.5 MG tablet Commonly known as:  COREG Take 1 1/2 tablet twice daily (18.75 mg total)   diphenhydrAMINE 25 MG tablet Commonly known as:  BENADRYL Take 25 mg by mouth at bedtime as needed for sleep (and sinus issues).   docusate sodium 100 MG capsule Commonly known as:  COLACE Take 100 mg by mouth daily as needed for mild constipation. Stool softener   furosemide 80 MG tablet Commonly known as:  LASIX Take 1 tablet (80 mg total) by mouth daily.   metolazone 2.5 MG tablet Commonly known as:  ZAROXOLYN Take 1 tablet (2.5 mg total) by mouth as directed. By the heart failure clinic   omeprazole 20 MG capsule Commonly known as:  PRILOSEC Take 20 mg by mouth daily.   Osteo Bi-Flex Adv Joint Shield Tabs Take 1 tablet by mouth daily.   potassium chloride SA 20 MEQ tablet Commonly known as:  K-DUR Take 1 tablet (20 mEq total) by mouth as directed. By the heart failure clinic.   Refresh Tears 0.5 % Soln Generic drug:  carboxymethylcellulose Place 1 drop into both eyes 2 (two) times daily.   Rivaroxaban 15 MG Tabs tablet Commonly known as:  XARELTO Take 1 tablet (15 mg total) by mouth daily with supper.   timolol 0.5 % ophthalmic solution Commonly known as:  TIMOPTIC Place 1 drop into the right eye 2 (two) times daily.   vitamin B-12 1000 MCG tablet Commonly known as:  CYANOCOBALAMIN Take 1,000 mcg by mouth 2 (two) times daily.    Vitamin D3 75 MCG (3000 UT) Tabs Take 3,000 Units by mouth daily.           Objective:   Physical Exam There were no vitals taken for this visit. This is a virtual phone visit, patient is alert oriented x3, does not seem to be short of breath during the conversation.    Assessment     Assessment   HTN severe CKD: creat ~1.4, 1.5 Dyslipidemia (not checking labs, patient refused medications) Hematology: --ITP-- hematology visit for 11-05-14, released to the care of PCP , check CBCs every 4 months, consult hematology if platelets <50 K --Pernicious anemia --Anemia of chronic disease CV: --CAD --Tako Tsubo cardiomyopathy --Diastolic  CHF- Dr Missy Sabins --AV block first-degree ; Paroxysmal Afib with RVR/Tachybrady syndrome s/p admission: implanted aSt Jude PPM 11/06/16 DJD: --Compression fractures --Lumbar stenosis Dr. Carloyn Manner --Multiple pelvic fractures , cervical spine compression  fracture 2013 [facility] --H/o nonhealing rib  --Osteoporosis: Dr. Agapito Games; s/p forteo, s/p  Prolia 09-2015 (declined to restart 11/2018). Last DEXA 09-29-16 OPHT: --Glaucoma  --S/p cornea transplant -- mac degeneration h/o granuloma annulare   H/o pseudomembranous colitis 2013  PLAN: HTN: Currently on Lasix, potassium, carvedilol, reports ambulatory BPs are very good. CHF: Was seen by cardiology 11/24/2018, she was felt to be volume overloaded, Lasix increased to 80 mg daily, patient reports that she also was Rx and  took 2 doses of metolazone but then stop.  She is actually improving, less short of breath, less edema.  Has a follow-up with cardiology today. Osteoporosis: Not on Prolia at this point, not interested on going back due to perceived side effects (bone aches).  Explained the benefits of Prolia, still declines. RTC 3 to 4 months, will call.,  I discussed the assessment and treatment plan with the patient. The patient was provided an opportunity to ask questions and all were answered. The patient  agreed with the plan and demonstrated an understanding of the instructions.   The patient was advised to call back or seek an in-person evaluation if the symptoms worsen or if the condition fails to improve as anticipated.  I provided 15 minutes of non-face-to-face time during this encounter.  Kathlene November, MD

## 2018-12-07 NOTE — Assessment & Plan Note (Signed)
HTN: Currently on Lasix, potassium, carvedilol, reports ambulatory BPs are very good. CHF: Was seen by cardiology 11/24/2018, she was felt to be volume overloaded, Lasix increased to 80 mg daily, patient reports that she also was Rx and  took 2 doses of metolazone but then stop.  She is actually improving, less short of breath, less edema.  Has a follow-up with cardiology today. Osteoporosis: Not on Prolia at this point, not interested on going back due to perceived side effects (bone aches).  Explained the benefits of Prolia, still declines. RTC 3 to 4 months, will call.,

## 2018-12-08 ENCOUNTER — Telehealth (INDEPENDENT_AMBULATORY_CARE_PROVIDER_SITE_OTHER): Payer: Medicare Other | Admitting: Internal Medicine

## 2018-12-08 ENCOUNTER — Other Ambulatory Visit: Payer: Self-pay

## 2018-12-08 DIAGNOSIS — Z7901 Long term (current) use of anticoagulants: Secondary | ICD-10-CM

## 2018-12-08 DIAGNOSIS — I5032 Chronic diastolic (congestive) heart failure: Secondary | ICD-10-CM

## 2018-12-08 DIAGNOSIS — I4819 Other persistent atrial fibrillation: Secondary | ICD-10-CM

## 2018-12-08 NOTE — Progress Notes (Signed)
Electrophysiology TeleHealth Note   Due to national recommendations of social distancing due to COVID 19, an audio/video telehealth visit is felt to be most appropriate for this patient at this time.  See MyChart message from today for the patient's consent to telehealth for [name].   Date:  12/08/2018   ID:  [name], DOB 1922-12-17, MRN [id number]  Location: patient's home  Provider location: [address] [place]  Evaluation Performed: Follow-up visit  PCP:  Colon Branch, MD  Cardiologist:  Glori Bickers, MD  Electrophysiologist:  Dr Lovena Le  Chief Complaint:  "I'm short of breath all of the time."  History of Present Illness:    [name] is a 85 y.o. female who presents via audio/video conferencing for a telehealth visit today. She has a h/o diastolic heart failure, atrial fib and heart block. Since last being seen in our clinic, the patient reports problems with sob and leg swelling. She also notes that her veins hurt.  Today, she denies symptoms of palpitations, chest pain, shortness of breath,  lower extremity edema, dizziness, presyncope, or syncope.  The patient is otherwise without complaint today.  The patient denies symptoms of fevers, chills, cough, or new SOB worrisome for COVID 19.  Past Medical History:  Diagnosis Date  . AV block, 1st degree   . CAD (coronary artery disease)    Non ST elevation 2005 ; LHC in 6/05 in setting of NSTEMI: ant apical AK and inf-apical AK, EF 29%, dOM2 70-80% (small); findings c/w Tako-Tsubo CM  . Cardiomyopathy, nonischemic ([place]) 04/2008   Likely Tako-Tsubo. Resolved. --dx'd on cath 2005. EF 29% with minimal distal CAD (small OM1 70-80).  Echo 10/09 EF 55%;  echo 01/26/12: EF 55%, mild LAE, PASP 34.  Marland Kitchen CHB (complete heart block) (HCC)    Intermittent, s/p Saint Jude PPM  . Colitis, ischemic ([place])   . Complication of anesthesia    post anesthesia excessive somnulence  . Compression fracture of  C-spine ([place]) 10/10/2011   Fell at home, tx at [facility]  . Diarrhea associated with pseudomembranous colitis 6/11-17/2013   [facility]  . Dyslipidemia   . Fall    with non-healing rib fractures  . Glaucoma   . Granuloma annulare   . History of phlebitis   . Hypertension    SEVERE  . Idiopathic thrombocytopenic purpura (ITP) (HCC)   . Lower extremity edema   . Lumbar stenosis 2004   DR. MARK ROY  . Multiple pelvic fractures 05/2012   Dr Adaline Sill , [facility]  . Osteoporosis     Past Surgical History:  Procedure Laterality Date  . ABDOMINAL HYSTERECTOMY    . APPENDECTOMY    . BACK SURGERY  10/2004   LS Disc 4-5 no sx.  Marland Kitchen CARDIAC CATHETERIZATION     12/2003  . CATARACT EXTRACTION, BILATERAL    . COLONOSCOPY  09/2005   adenoma  . COLONOSCOPY W/ POLYPECTOMY    . 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 PACEMAKER IMPLANT N/A 11/06/2016   Procedure: St Jude Pacemaker Implant;  Surgeon: Evans Lance, MD;  Location: [facility];  Service: Cardiovascular;  Laterality: N/A;  . rectal bleed  05-01-06   colonoscopy-ischemic colitis  . ROTATOR CUFF REPAIR    . VERTEBROPLASTY     Dr Gladstone Lighter    Current Outpatient Medications  Medication Sig Dispense Refill  . beta carotene w/minerals (OCUVITE) tablet Take 1 tablet by mouth  daily.     . brimonidine (ALPHAGAN) 0.2 % ophthalmic solution Place 1 drop into the right eye 2 (two) times daily.      . Calcium Carbonate-Vit D-Min (CALCIUM 1200 PO) Take 1,200 mg by mouth daily with breakfast.    . carboxymethylcellulose (REFRESH TEARS) 0.5 % SOLN Place 1 drop into both eyes 2 (two) times daily.     . carvedilol (COREG) 12.5 MG tablet Take 1 1/2 tablet twice daily (18.75 mg total) 90 tablet 3  . Cholecalciferol (VITAMIN D3) 3000 UNITS TABS Take 3,000 Units by mouth daily.     . diphenhydrAMINE (BENADRYL) 25 MG tablet Take 25 mg by mouth at bedtime as needed for sleep (and sinus issues).     . docusate sodium (COLACE) 100 MG capsule Take  100 mg by mouth daily as needed for mild constipation. Stool softener     . furosemide (LASIX) 80 MG tablet Take 1 tablet (80 mg total) by mouth daily. 90 tablet 3  . metolazone (ZAROXOLYN) 2.5 MG tablet Take 1 tablet (2.5 mg total) by mouth as directed. By the heart failure clinic 20 tablet 3  . Misc Natural Products (OSTEO BI-FLEX ADV JOINT SHIELD) TABS Take 1 tablet by mouth daily.     Marland Kitchen omeprazole (PRILOSEC) 20 MG capsule Take 20 mg by mouth daily.      . potassium chloride SA (K-DUR) 20 MEQ tablet Take 1 tablet (20 mEq total) by mouth as directed. By the heart failure clinic. 90 tablet 3  . Rivaroxaban (XARELTO) 15 MG TABS tablet Take 1 tablet (15 mg total) by mouth daily with supper. 90 tablet 3  . timolol (TIMOPTIC) 0.5 % ophthalmic solution Place 1 drop into the right eye 2 (two) times daily.  5  . vitamin B-12 (CYANOCOBALAMIN) 1000 MCG tablet Take 1,000 mcg by mouth 2 (two) times daily.      No current facility-administered medications for this visit.     Allergies:   Morphine and related; Oxycodone; Penicillins; Clindamycin/lincomycin; Colchicine; Norvasc [amlodipine besylate]; Ofloxacin; Streptomycin; Tape; Tramadol; Alendronate sodium; Amiodarone; and Plaquenil [hydroxychloroquine sulfate]   Social History:  The patient  reports that she has never smoked. She has never used smokeless tobacco. She reports that she does not drink alcohol or use drugs.   Family History:  The patient's family history includes Breast cancer in her sister and sister; Colon cancer in her father; Coronary artery disease in her mother; Heart failure in her mother; Kidney disease in her mother; Rectal cancer in her sister; Renal cancer in her mother; Stroke in her father.   ROS:  Please see the history of present illness.   All other systems are personally reviewed and negative.    Exam:    Vital Signs:  Wt. - 165, BP - 125/81, P - 85    Labs/Other Tests and Data Reviewed:    Recent Labs: 06/16/2018:  Hemoglobin 10.8; Platelets 61 06/17/2018: Magnesium 1.9 06/23/2018: B Natriuretic Peptide 187.7 11/28/2018: BUN 19; Creatinine, Ser 1.43; Potassium 4.1; Sodium 141   Wt Readings from Last 3 Encounters:  11/28/18 167 lb (75.8 kg)  09/06/18 167 lb (75.8 kg)  07/12/18 158 lb 9.6 oz (71.9 kg)     Other studies personally reviewed: Additional studies/ records that were reviewed today include:   Last device remote is reviewed from [facility] dated 10/19/18 which reveals normal device function, no arrhythmias except for atrial fib.   ASSESSMENT & PLAN:    1.  Atrial fib - her  rates are controlled for the most part. She was intolerant of amiodarone. 2. Chronic diastolic heart failure - her symptoms are class 2-3. She is still having trouble with peripheral edema. She will take an extra 40 mg of lasix today. 3. "vein pain" - I am not sure what could be causing this. I wonder about peripheral neuropathy. She could also have arteritis. An ESR might be useful.  4. PPM - her St. Jude DDD PM is working normally. We will recheck in several months. 5. COVID 19 screen The patient denies symptoms of COVID 19 at this time.  The importance of social distancing was discussed today.  Follow-up:  With me in 3 months Next remote: 7/20  Current medicines are reviewed at length with the patient today.   The patient does not have concerns regarding her medicines.  The following changes were made today:  none  Labs/ tests ordered today include: none No orders of the defined types were placed in this encounter.    Patient Risk:  after full review of this patients clinical status, I feel that they are at moderate risk at this time.  Today, I have spent 15 minutes with the patient with telehealth technology discussing all of the above.    Signed, Cristopher Peru, MD  12/08/2018 10:33 AM     [address] [place] [address] [phone] (office) [phone] (fax)

## 2018-12-14 ENCOUNTER — Other Ambulatory Visit: Payer: Self-pay

## 2018-12-14 ENCOUNTER — Ambulatory Visit (HOSPITAL_COMMUNITY)
Admission: RE | Admit: 2018-12-14 | Discharge: 2018-12-14 | Disposition: A | Discharge status: Home / Self Care | Payer: Medicare Other | Source: Ambulatory Visit | Attending: Internal Medicine | Admitting: Internal Medicine

## 2018-12-14 DIAGNOSIS — I5032 Chronic diastolic (congestive) heart failure: Secondary | ICD-10-CM | POA: Insufficient documentation

## 2018-12-14 LAB — CBC
HCT: 34.4 % — ABNORMAL LOW (ref 36.0–46.0)
Hemoglobin: 10.9 g/dL — ABNORMAL LOW (ref 12.0–15.0)
MCH: 30.5 pg (ref 26.0–34.0)
MCHC: 31.7 g/dL (ref 30.0–36.0)
MCV: 96.4 fL (ref 80.0–100.0)
Platelets: 66 10*3/uL — ABNORMAL LOW (ref 150–400)
RBC: 3.57 MIL/uL — ABNORMAL LOW (ref 3.87–5.11)
RDW: 14.3 % (ref 11.5–15.5)
WBC: 5.4 10*3/uL (ref 4.0–10.5)
nRBC: 0 % (ref 0.0–0.2)

## 2018-12-14 LAB — BASIC METABOLIC PANEL
Anion gap: 11 (ref 5–15)
BUN: 28 mg/dL — ABNORMAL HIGH (ref 8–23)
CO2: 28 mmol/L (ref 22–32)
Calcium: 9.2 mg/dL (ref 8.9–10.3)
Chloride: 100 mmol/L (ref 98–111)
Creatinine, Ser: 1.79 mg/dL — ABNORMAL HIGH (ref 0.44–1.00)
GFR calc Af Amer: 28 mL/min — ABNORMAL LOW (ref >60)
GFR calc non Af Amer: 24 mL/min — ABNORMAL LOW (ref >60)
Glucose, Bld: 113 mg/dL — ABNORMAL HIGH (ref 70–99)
Potassium: 3.8 mmol/L (ref 3.5–5.1)
Sodium: 139 mmol/L (ref 135–145)

## 2018-12-14 NOTE — Progress Notes (Signed)
[name]      DOB: May 16, 1923  Purpose of Visit: CBC, BMET, ReDS, VS HF provider:  Medications: Is the patient taking all medications listed on MAR from Epic? Yes  List any medications that are not being taken correctly:  List any medication refills needed:   Is the patient able to pick up medications? Yes  Vitals: BP: 100/68    HR: 75 irregular Oxygen:  97%RA Weight:  160lb        Physical Exam:  Lung sounds: clear  Heart sounds:  irregular  Peripheral edema: yes, 1-2+ but much improved from last home visit  Wounds: no Location:  Any patient concerns?  She c/o feeling tired, but not more than usual.  She did state she was doing much better over the last week and feels better overall.  She said she was having to rest frequently when walking to the bathroom or to make her bed but hasn't had to over the last week.  States her ankles are much less swollen, but she hadn't used compression stockings because no one from [facility] called to come over to bring them.   Labs drawn and brought to the HF clinic and sent to the lab for resulting.  ReDS Vest/Clip Reading:  32%  Rhythm Strip: N/A  Is Home Health recommended? No If yes, state reason:  Vanita Ingles, RN 12/14/18

## 2018-12-17 ENCOUNTER — Other Ambulatory Visit (HOSPITAL_COMMUNITY): Payer: Self-pay | Admitting: Internal Medicine

## 2018-12-19 ENCOUNTER — Other Ambulatory Visit (HOSPITAL_COMMUNITY): Payer: Self-pay

## 2018-12-27 ENCOUNTER — Other Ambulatory Visit: Payer: Self-pay | Admitting: Physician Assistant

## 2018-12-29 ENCOUNTER — Telehealth: Payer: Self-pay | Admitting: Internal Medicine

## 2018-12-29 NOTE — Telephone Encounter (Signed)
LVM for pt to call the office, we are needing to change appt in person with Dr Nani Ravens for tomorrow and cancel the appt that the pt has with Dr Larose Kells tomorrow 12-30-2018 at 1:40.

## 2018-12-30 ENCOUNTER — Other Ambulatory Visit: Payer: Self-pay

## 2018-12-30 ENCOUNTER — Encounter: Payer: Self-pay | Admitting: Family Medicine

## 2018-12-30 ENCOUNTER — Ambulatory Visit: Payer: Medicare Other | Admitting: Internal Medicine

## 2018-12-30 ENCOUNTER — Ambulatory Visit (INDEPENDENT_AMBULATORY_CARE_PROVIDER_SITE_OTHER): Payer: Medicare Other | Admitting: Family Medicine

## 2018-12-30 ENCOUNTER — Ambulatory Visit (HOSPITAL_BASED_OUTPATIENT_CLINIC_OR_DEPARTMENT_OTHER)
Admission: RE | Admit: 2018-12-30 | Discharge: 2018-12-30 | Disposition: A | Discharge status: Home / Self Care | Payer: Medicare Other | Source: Ambulatory Visit | Attending: Family Medicine | Admitting: Family Medicine

## 2018-12-30 VITALS — BP 132/80 | HR 99 | Temp 98.5°F | Ht 64.0 in | Wt 165.0 lb

## 2018-12-30 DIAGNOSIS — S52601A Unspecified fracture of lower end of right ulna, initial encounter for closed fracture: Secondary | ICD-10-CM | POA: Diagnosis not present

## 2018-12-30 DIAGNOSIS — M25532 Pain in left wrist: Secondary | ICD-10-CM | POA: Diagnosis not present

## 2018-12-30 DIAGNOSIS — M25531 Pain in right wrist: Secondary | ICD-10-CM | POA: Diagnosis not present

## 2018-12-30 DIAGNOSIS — S6992XA Unspecified injury of left wrist, hand and finger(s), initial encounter: Secondary | ICD-10-CM | POA: Diagnosis not present

## 2018-12-30 DIAGNOSIS — I2510 Atherosclerotic heart disease of native coronary artery without angina pectoris: Secondary | ICD-10-CM

## 2018-12-30 DIAGNOSIS — S63111A Subluxation of metacarpophalangeal joint of right thumb, initial encounter: Secondary | ICD-10-CM | POA: Diagnosis not present

## 2018-12-30 NOTE — Patient Instructions (Addendum)
Ice/cold pack over area for 10-15 min twice daily.  OK to take Tylenol 1000 mg (2 extra strength tabs) or 975 mg (3 regular strength tabs) every 6 hours as needed.  We will be in touch regarding your final X-ray results.  If you do not hear anything about your referral in the next 1-2 weeks, call our office and ask for an update.  Wear the splints at night and if you are going to use your hands.   Wrist and Forearm Exercises Do exercises exactly as told by your health care provider and adjust them as directed. It is normal to feel mild stretching, pulling, tightness, or discomfort as you do these exercises, but you should stop right away if you feel sudden pain or your pain gets worse.   RANGE OF MOTION EXERCISES These exercises warm up your muscles and joints and improve the movement and flexibility of your injured wrist and forearm. These exercises also help to relieve pain, numbness, and tingling. These exercises are done using the muscles in your injured wrist and forearm. Exercise A: Wrist Flexion, Active 1. With your fingers relaxed, bend your wrist forward as far as you can. 2. Hold this position for 30 seconds. Repeat 2 times. Complete this exercise 3 times per week. Exercise B: Wrist Extension, Active 1. With your fingers relaxed, bend your wrist backward as far as you can. 2. Hold this position for 30 seconds. Repeat 2 times. Complete this exercise 3 times per week. Exercise C: Supination, Active  1. Stand or sit with your arms at your sides. 2. Bend your left / right elbow to an "L" shape (90 degrees). 3. Turn your palm upward until you feel a gentle stretch on the inside of your forearm. 4. Hold this position for 30 seconds. 5. Slowly return your palm to the starting position. Repeat 2 times. Complete this exercise 3 times per week. Exercise D: Pronation, Active  1. Stand or sit with your arms at your sides. 2. Bend your left / right elbow to an "L" shape (90 degrees).  3. Turn your palm downward until you feel a gentle stretch on the top of your forearm. 4. Hold this position for 30 seconds. 5. Slowly return your palm to the starting position. Repeat 2 times. Complete this exercise once a day.  STRETCHING EXERCISES These exercises warm up your muscles and joints and improve the movement and flexibility of your injured wrist and forearm. These exercises also help to relieve pain, numbness, and tingling. These exercises are done using your healthy wrist and forearm to help stretch the muscles in your injured wrist and forearm. Exercise E: Wrist Flexion, Passive  1. Extend your left / right arm in front of you, relax your wrist, and point your fingers downward. 2. Gently push on the back of your hand. Stop when you feel a gentle stretch on the top of your forearm. 3. Hold this position for 30 seconds. Repeat 2 times. Complete this exercise 3 times per week. Exercise F: Wrist Extension, Passive  1. Extend your left / right arm in front of you and turn your palm upward. 2. Gently pull your palm and fingertips back so your fingers point downward. You should feel a gentle stretch on the palm-side of your forearm. 3. Hold this position for 30 seconds. Repeat 2 times. Complete this exercise 3 times per week. Exercise G: Forearm Rotation, Supination, Passive 1. Sit with your left / right elbow bent to an "L" shape (90 degrees)  with your forearm resting on a table. 2. Keeping your upper body and shoulder still, use your other hand to rotate your forearm palm-up until you feel a gentle to moderate stretch. 3. Hold this position for 30 seconds. 4. Slowly release the stretch and return to the starting position. Repeat 2 times. Complete this exercise 3 times per week. Exercise H: Forearm Rotation, Pronation, Passive 1. Sit with your left / right elbow bent to an "L" shape (90 degrees) with your forearm resting on a table. 2. Keeping your upper body and shoulder still,  use your other hand to rotate your forearm palm-down until you feel a gentle to moderate stretch. 3. Hold this position for 30 seconds. 4. Slowly release the stretch and return to the starting position. Repeat 2 times. Complete this exercise 3 times per week.  STRENGTHENING EXERCISES These exercises build strength and endurance in your wrist and forearm. Endurance is the ability to use your muscles for a long time, even after they get tired. Exercise I: Wrist Flexors  1. Sit with your left / right forearm supported on a table and your hand resting palm-up over the edge of the table. Your elbow should be bent to an "L" shape (about 90 degrees) and be below the level of your shoulder. 2. Hold a 3-5 lb weight in your left / right hand. Or, hold a rubber exercise band or tube in both hands, keeping your hands at the same level and hip distance apart. There should be a slight tension in the exercise band or tube. 3. Slowly curl your hand up toward your forearm. 4. Hold this position for 3 seconds. 5. Slowly lower your hand back to the starting position. Repeat 2 times. Complete this exercise 3 times per week. Exercise J: Wrist Extensors  1. Sit with your left / right forearm supported on a table and your hand resting palm-down over the edge of the table. Your elbow should be bent to an "L" shape (about 90 degrees) and be below the level of your shoulder. 2. Hold a 3-5 lb weight in your left / right hand. Or, hold a rubber exercise band or tube in both hands, keeping your hands at the same level and hip distance apart. There should be a slight tension in the exercise band or tube. 3. Slowly curl your hand up toward your forearm. 4. Hold this position for 3 seconds. 5. Slowly lower your hand back to the starting position. Repeat 2 times. Complete this exercise 3 times per week. Exercise K: Forearm Rotation, Supination  1. Sit with your left / right forearm supported on a table and your hand resting  palm-down. Your elbow should be at your side, bent to an "L" shape (about 90 degrees), and below the level of your shoulder. Keep your wrist stable and in a neutral position throughout the exercise. 2. Gently hold a lightweight hammer with your left / right hand. 3. Without moving your elbow or wrist, slowly rotate your palm upward to a thumbs-up position. 4. Hold this position for 3 seconds. 5. Slowly return your forearm to the starting position. Repeat 2 times. Complete this exercise 3 times per week. Exercise L: Forearm Rotation, Pronation  1. Sit with your left / right forearm supported on a table and your hand resting palm-up. Your elbow should be at your side, bent to an "L" shape (about 90 degrees), and below the level of your shoulder. Keep your wrist stable. Do not allow it to move  backward or forward during the exercise. 2. Gently hold a lightweight hammer with your left / right hand. 3. Without moving your elbow or wrist, slowly rotate your palm and hand upward to a thumbs-up position. 4. Hold this position for 3 seconds. 5. Slowly return your forearm to the starting position. Repeat 2 times. Complete this exercise 3 times per week. Exercise M: Grip Strengthening  1. Hold one of these items in your left / right hand: play dough, therapy putty, a dense sponge, a stress ball, or a large, rolled sock. 2. Squeeze as hard as you can without increasing pain. 3. Hold this position for 5 seconds. 4. Slowly release your grip. Repeat 2 times. Complete this exercise 3 times per week.  This information is not intended to replace advice given to you by your health care provider. Make sure you discuss any questions you have with your health care provider. Document Released: 05/13/2005 Document Revised: 03/23/2016 Document Reviewed: 03/24/2015 Elsevier Interactive Patient Education  [employer].

## 2018-12-30 NOTE — Progress Notes (Signed)
Musculoskeletal Exam  Patient: [name] DOB: March 04, 1923  DOS: 12/30/2018  SUBJECTIVE:  Chief Complaint:   Chief Complaint  Patient presents with  . Fall    2 weeks ago  . Wrist Pain    both hands, right is worse    [name] is a 85 y.o.  female for evaluation and treatment of b/l wrist pain.   Onset:  10 days ago. Fell on both hands backwards Location: ulnar side of wrist Character:  dull  Progression of issue:  is unchanged Associated symptoms: swelling, decreased ROM; no bruising on wrists Treatment: to date has been rest, heat, Advil, ice and heat.   Neurovascular symptoms: no  ROS: Musculoskeletal/Extremities: +wrist pain  Past Medical History:  Diagnosis Date  . AV block, 1st degree   . CAD (coronary artery disease)    Non ST elevation 2005 ; LHC in 6/05 in setting of NSTEMI: ant apical AK and inf-apical AK, EF 29%, dOM2 70-80% (small); findings c/w Tako-Tsubo CM  . Cardiomyopathy, nonischemic ([place]) 04/2008   Likely Tako-Tsubo. Resolved. --dx'd on cath 2005. EF 29% with minimal distal CAD (small OM1 70-80).  Echo 10/09 EF 55%;  echo 01/26/12: EF 55%, mild LAE, PASP 34.  Marland Kitchen CHB (complete heart block) (HCC)    Intermittent, s/p Saint Jude PPM  . Colitis, ischemic ([place])   . Complication of anesthesia    post anesthesia excessive somnulence  . Compression fracture of C-spine ([place]) 10/10/2011   Fell at home, tx at [facility]  . Diarrhea associated with pseudomembranous colitis 6/11-17/2013   [facility]  . Dyslipidemia   . Fall    with non-healing rib fractures  . Glaucoma   . Granuloma annulare   . History of phlebitis   . Hypertension    SEVERE  . Idiopathic thrombocytopenic purpura (ITP) (HCC)   . Lower extremity edema   . Lumbar stenosis 2004   DR. MARK ROY  . Multiple pelvic fractures 05/2012   Dr Adaline Sill , [facility]  . Osteoporosis     Objective: VITAL SIGNS: BP 132/80 (BP Location: Right Arm, Patient Position: Sitting, Cuff Size:  Normal)   Pulse 99   Temp 98.5 F (36.9 C) (Oral)   Ht 5\' 4"  (1.626 m)   Wt 165 lb (74.8 kg)   SpO2 96%   BMI 28.32 kg/m  Constitutional: Well formed, well developed. No acute distress. Cardiovascular: Brisk cap refill Thorax & Lungs: No accessory muscle use Musculoskeletal: wrists.   Normal active range of motion: yes.   Normal passive range of motion: no Tenderness to palpation: yes, over ulnar styloid process b/l  No snuffbox ttp or significant ttp over metacarpals b/l Deformity: mild edema on R medial wrist Ecchymosis: no Neurologic: Normal sensory function. No focal deficits noted.  Psychiatric: Normal mood. Age appropriate judgment and insight. Alert & oriented x 3.    Assessment:  Bilateral wrist pain - Plan: DG Wrist Complete Left, DG Wrist Complete Right, ice, heat, stretches/exercises. Will be in touch regarding official read. I think she fx'd her L distal ulna. Will refer to ortho, will cancel referral if imaging neg.   Plan: Orders as above. F/u in 2 weeks w Dr. Larose Kells prn. The patient voiced understanding and agreement to the plan.   [place], DO 12/30/18  2:56 PM

## 2019-01-02 ENCOUNTER — Ambulatory Visit (HOSPITAL_COMMUNITY)
Admission: RE | Admit: 2019-01-02 | Discharge: 2019-01-02 | Disposition: A | Discharge status: Home / Self Care | Payer: Medicare Other | Source: Ambulatory Visit | Attending: Internal Medicine | Admitting: Internal Medicine

## 2019-01-02 ENCOUNTER — Other Ambulatory Visit: Payer: Self-pay

## 2019-01-02 DIAGNOSIS — I4819 Other persistent atrial fibrillation: Secondary | ICD-10-CM | POA: Diagnosis not present

## 2019-01-02 DIAGNOSIS — I5032 Chronic diastolic (congestive) heart failure: Secondary | ICD-10-CM | POA: Diagnosis not present

## 2019-01-02 NOTE — Addendum Note (Signed)
Encounter addended by: Valeda Malm, RN on: 01/02/2019 4:02 PM  Actions taken: Clinical Note Signed

## 2019-01-02 NOTE — Patient Instructions (Signed)
NO medication changes today  Your physician recommends that you schedule a follow-up appointment in: 4 months with Dr Haroldine Laws.  Someone from his office will contact you to schedule your follow up.  At the [facility], you and your health needs are our priority. As part of our continuing mission to provide you with exceptional heart care, we have created designated Provider Care Teams. These Care Teams include your primary Cardiologist (physician) and Advanced Practice Providers (APPs- Physician Assistants and Nurse Practitioners) who all work together to provide you with the care you need, when you need it.   You may see any of the following providers on your designated Care Team at your next follow up: Marland Kitchen Dr Glori Bickers . Dr Loralie Champagne . Darrick Grinder, NP

## 2019-01-02 NOTE — Progress Notes (Addendum)
No changes noted. 4 month with DB. Message sent to [facility] to add to recall.  AVS mailed.

## 2019-01-02 NOTE — Progress Notes (Signed)
Heart Failure TeleHealth Note  Due to national recommendations of social distancing due to [place] 19, Audio/video telehealth visit is felt to be most appropriate for this patient at this time.  See MyChart message from today for patient consent regarding telehealth for [facility].  Date:  01/02/2019   ID:  [name], DOB 1922/09/04, MRN [id number]  Location: Home  Provider location: [facility] Type of Visit: Established patient  PCP:  Colon Branch, MD  Cardiologist:  Glori Bickers, MD Primary HF: Susy Placzek EP: Lovena Le [facility]  Chief Complaint: Heart Failure follow-up   History of Present Illness:  [name] is a 85 y.o. 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 LHC in 6/05 in setting of NSTEMI: ant apical AK and inf-apical AK, EF 29%, dOM2 70-80% (small). EF has recovered. Also has a h/o HTN, permanent AF with tachy-brady syndrome s/p PPM and fractured pelvis 05/2012.  Admitted 4/27-4/28/18 for evaluation of tachycardia. Had recent holter monitor that showed 6.5 second pause. Coreg stopped and referred to EP. She then developed Afib RVR. With pause, PPM was placed 11/06/16 by Dr. Lovena Le.  Noted to be in Afib with controlled ventricular rate on discharge.   She presents via Engineer, civil (consulting) for a telehealth visit today. We had a call 2 weeks ago and volume status seemed much improved. Had ReDS vest on 6/3 with reading 32%. Creatinine up 1.4 -. 1.7. Says she is much, much better. Weight ranges ~162-163. Breathing much better. No CP, SOB, edema or PND. No dizziness.    01/2012 ECHO EF 55% 08/14/13 ECHO EF 55%, normal RV size and systolic function.    [name] denies symptoms worrisome for COVID 19.   Past Medical History:  Diagnosis Date  . AV block, 1st degree   . CAD (coronary artery disease)    Non ST elevation 2005 ; LHC in 6/05 in setting of NSTEMI: ant apical AK and inf-apical  AK, EF 29%, dOM2 70-80% (small); findings c/w Tako-Tsubo CM  . Cardiomyopathy, nonischemic ([place]) 04/2008   Likely Tako-Tsubo. Resolved. --dx'd on cath 2005. EF 29% with minimal distal CAD (small OM1 70-80).  Echo 10/09 EF 55%;  echo 01/26/12: EF 55%, mild LAE, PASP 34.  Marland Kitchen CHB (complete heart block) (HCC)    Intermittent, s/p Saint Jude PPM  . Colitis, ischemic ([place])   . Complication of anesthesia    post anesthesia excessive somnulence  . Compression fracture of C-spine ([place]) 10/10/2011   Fell at home, tx at [facility]  . Diarrhea associated with pseudomembranous colitis 6/11-17/2013   [facility]  . Dyslipidemia   . Fall    with non-healing rib fractures  . Glaucoma   . Granuloma annulare   . History of phlebitis   . Hypertension    SEVERE  . Idiopathic thrombocytopenic purpura (ITP) (HCC)   . Lower extremity edema   . Lumbar stenosis 2004   DR. MARK ROY  . Multiple pelvic fractures 05/2012   Dr Adaline Sill , [facility]  . Osteoporosis    Past Surgical History:  Procedure Laterality Date  . ABDOMINAL HYSTERECTOMY    . APPENDECTOMY    . BACK SURGERY  10/2004   LS Disc 4-5 no sx.  Marland Kitchen CARDIAC CATHETERIZATION     12/2003  . CATARACT EXTRACTION, BILATERAL    . COLONOSCOPY  09/2005   adenoma  . COLONOSCOPY W/ POLYPECTOMY    . 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 PACEMAKER IMPLANT N/A 11/06/2016   Procedure: St Jude Pacemaker Implant;  Surgeon: Evans Lance, MD;  Location: [place] [facility];  Service: Cardiovascular;  Laterality: N/A;  . rectal bleed  05-01-06   colonoscopy-ischemic colitis  . ROTATOR CUFF REPAIR    . VERTEBROPLASTY     Dr Gladstone Lighter     Current Outpatient Medications  Medication Sig Dispense Refill  . beta carotene w/minerals (OCUVITE) tablet Take 1 tablet by mouth daily.     . brimonidine (ALPHAGAN) 0.2 % ophthalmic solution Place 1 drop into the right eye 2 (two) times daily.      . Calcium Carbonate-Vit D-Min (CALCIUM 1200 PO) Take 1,200 mg by mouth daily  with breakfast.    . carboxymethylcellulose (REFRESH TEARS) 0.5 % SOLN Place 1 drop into both eyes 2 (two) times daily.     . carvedilol (COREG) 12.5 MG tablet TAKE 1 1/2 TABLET TWICE DAILY (18.75 MG TOTAL) 270 tablet 1  . Cholecalciferol (VITAMIN D3) 3000 UNITS TABS Take 3,000 Units by mouth daily.     . diphenhydrAMINE (BENADRYL) 25 MG tablet Take 25 mg by mouth at bedtime as needed for sleep (and sinus issues).     . docusate sodium (COLACE) 100 MG capsule Take 100 mg by mouth daily as needed for mild constipation. Stool softener     . furosemide (LASIX) 80 MG tablet Take 1 tablet (80 mg total) by mouth daily. 90 tablet 3  . metolazone (ZAROXOLYN) 2.5 MG tablet TAKE 1 TABLET BY MOUTH AS DIRECTED BY THE HEART FAILURE CLINIC 60 tablet 1  . Misc Natural Products (OSTEO BI-FLEX ADV JOINT SHIELD) TABS Take 1 tablet by mouth daily.     Marland Kitchen omeprazole (PRILOSEC) 20 MG capsule Take 20 mg by mouth daily.      . potassium chloride SA (K-DUR) 20 MEQ tablet Take 1 tablet (20 mEq total) by mouth as directed. By the heart failure clinic. 90 tablet 3  . Rivaroxaban (XARELTO) 15 MG TABS tablet Take 1 tablet (15 mg total) by mouth daily with supper. 90 tablet 3  . timolol (TIMOPTIC) 0.5 % ophthalmic solution Place 1 drop into the right eye 2 (two) times daily.  5  . vitamin B-12 (CYANOCOBALAMIN) 1000 MCG tablet Take 1,000 mcg by mouth 2 (two) times daily.      No current facility-administered medications for this encounter.     Allergies:   Morphine and related, Oxycodone, Penicillins, Clindamycin/lincomycin, Colchicine, Norvasc [amlodipine besylate], Ofloxacin, Streptomycin, Tape, Tramadol, Alendronate sodium, Amiodarone, and Plaquenil [hydroxychloroquine sulfate]   Social History:  The patient  reports that she has never smoked. She has never used smokeless tobacco. She reports that she does not drink alcohol or use drugs.   Family History:  The patient's family history includes Breast cancer in her sister  and sister; Colon cancer in her father; Coronary artery disease in her mother; Heart failure in her mother; Kidney disease in her mother; Rectal cancer in her sister; Renal cancer in her mother; Stroke in her father.   ROS:  Please see the history of present illness.   All other systems are personally reviewed and negative.   Exam:  (Video/Tele Health Call; Exam is subjective and or/visual.) General:  Speaks in full sentences but breathless Lungs: Normal respiratory effort with conversation.  Abdomen: Non-distended per patient report Extremities: Pt endorses no edema. Neuro: Alert & oriented x 3.   Recent Labs: 06/17/2018: Magnesium 1.9 06/23/2018: B Natriuretic  Peptide 187.7 12/14/2018: BUN 28; Creatinine, Ser 1.79; Hemoglobin 10.9; Platelets 66; Potassium 3.8; Sodium 139  Personally reviewed   Wt Readings from Last 3 Encounters:  12/30/18 74.8 kg (165 lb)  12/14/18 72.6 kg (160 lb)  11/28/18 75.8 kg (167 lb)      ASSESSMENT AND PLAN:  1. Chronic diastolic CHF: History of Takotsubo cardiomyopathy, EF has recovered. - Echo 08/14/13 EF 55-60% with normal RV.  - Volume status much improved. Continue lasix 80 daily. Can take extra 40 as needed - Reinforced fluid restriction to < 2 L daily, sodium restriction to less than 2000 mg daily, and the importance of daily weights.   2. CAD:  - Minimal branch vessel disease on cath  - No s/s ischemia.  - Off ASA in setting of Xarelto. Has refused statins. 3. Permanent AF with Tachybrady syndrome s/p St Jude PPM 11/06/16 Regular pulse. This patients CHA2DS2-VASc Score and unadjusted Ischemic Stroke Rate (% per year) is equal to 9.7 % stroke rate/year from a score of 6  - Followed by Dr. Lovena Le and [place].No bleeding  4.  HTN  - she says BP much improved. Last BP check 100/68.   COVID screen The patient does not have any symptoms that suggest any further testing/ screening at this time.  Social distancing reinforced today.   Recommended follow-up:  As above  Relevant cardiac medications were reviewed at length with the patient today.   The patient does not have concerns regarding their medications at this time.   The following changes were made today:  As above  Today, I have spent 14 minutes with the patient with telehealth technology discussing the above issues .    Signed, Glori Bickers, MD  01/02/2019 3:31 PM  [facility] [place] [address] Heart and [place] [address] [phone] (office) [phone] (fax)

## 2019-01-02 NOTE — Addendum Note (Signed)
Encounter addended by: Valeda Malm, RN on: 01/02/2019 4:05 PM  Actions taken: Clinical Note Signed

## 2019-01-02 NOTE — Addendum Note (Signed)
Addended by: Sharon Seller B on: 01/02/2019 09:13 AM   Modules accepted: Orders

## 2019-01-03 ENCOUNTER — Encounter: Payer: Medicare Other | Admitting: Internal Medicine

## 2019-01-09 DIAGNOSIS — H353130 Nonexudative age-related macular degeneration, bilateral, stage unspecified: Secondary | ICD-10-CM | POA: Diagnosis not present

## 2019-01-09 DIAGNOSIS — Z79899 Other long term (current) drug therapy: Secondary | ICD-10-CM | POA: Diagnosis not present

## 2019-01-09 DIAGNOSIS — H43813 Vitreous degeneration, bilateral: Secondary | ICD-10-CM | POA: Diagnosis not present

## 2019-01-09 DIAGNOSIS — Z9889 Other specified postprocedural states: Secondary | ICD-10-CM | POA: Diagnosis not present

## 2019-01-09 DIAGNOSIS — Z947 Corneal transplant status: Secondary | ICD-10-CM | POA: Diagnosis not present

## 2019-01-09 DIAGNOSIS — H401114 Primary open-angle glaucoma, right eye, indeterminate stage: Secondary | ICD-10-CM | POA: Diagnosis not present

## 2019-01-09 DIAGNOSIS — H26492 Other secondary cataract, left eye: Secondary | ICD-10-CM | POA: Diagnosis not present

## 2019-01-12 ENCOUNTER — Ambulatory Visit: Payer: Medicare Other | Admitting: *Deleted

## 2019-01-16 ENCOUNTER — Ambulatory Visit: Payer: Medicare Other | Admitting: Internal Medicine

## 2019-01-16 DIAGNOSIS — Z0289 Encounter for other administrative examinations: Secondary | ICD-10-CM

## 2019-01-17 ENCOUNTER — Encounter: Payer: Self-pay | Admitting: Internal Medicine

## 2019-01-17 ENCOUNTER — Other Ambulatory Visit: Payer: Self-pay

## 2019-01-17 ENCOUNTER — Ambulatory Visit: Payer: Medicare Other | Admitting: *Deleted

## 2019-01-17 ENCOUNTER — Ambulatory Visit (HOSPITAL_BASED_OUTPATIENT_CLINIC_OR_DEPARTMENT_OTHER)
Admission: RE | Admit: 2019-01-17 | Discharge: 2019-01-17 | Disposition: A | Discharge status: Home / Self Care | Payer: Medicare Other | Source: Ambulatory Visit | Attending: Internal Medicine | Admitting: Internal Medicine

## 2019-01-17 ENCOUNTER — Ambulatory Visit (INDEPENDENT_AMBULATORY_CARE_PROVIDER_SITE_OTHER): Payer: Medicare Other | Admitting: Internal Medicine

## 2019-01-17 VITALS — BP 141/87 | HR 93 | Temp 97.9°F | Resp 16 | Ht 64.0 in | Wt 168.4 lb

## 2019-01-17 DIAGNOSIS — M25531 Pain in right wrist: Secondary | ICD-10-CM

## 2019-01-17 DIAGNOSIS — M25532 Pain in left wrist: Secondary | ICD-10-CM

## 2019-01-17 DIAGNOSIS — I2510 Atherosclerotic heart disease of native coronary artery without angina pectoris: Secondary | ICD-10-CM

## 2019-01-17 DIAGNOSIS — S6991XA Unspecified injury of right wrist, hand and finger(s), initial encounter: Secondary | ICD-10-CM | POA: Diagnosis not present

## 2019-01-17 MED ORDER — PREDNISONE 10 MG PO TABS: ORAL_TABLET | ORAL | 0 refills | Status: DC

## 2019-01-17 NOTE — Patient Instructions (Signed)
GO TO THE LAB : Get the blood work      STOP BY THE FIRST FLOOR:  get the XR   Take prednisone as prescribed for few days  Continue using ice  Continue using Aspercreme  Call if not gradually better in the next 2 weeks.

## 2019-01-17 NOTE — Progress Notes (Signed)
Pre visit review using our clinic review tool, if applicable. No additional management support is needed unless otherwise documented below in the visit note. 

## 2019-01-17 NOTE — Progress Notes (Signed)
Subjective:    Patient ID: [name], female    DOB: Jan 30, 1923, 85 y.o.   MRN: [id number]  DOS:  01/17/2019 Type of visit - description: Acute The patient fell   12/18/2018, at that time she felt she probably hurt her wrists. Was seen at this office 12/30/2018, x-rays were done, they show bilateral DJD changes consistent with chondrocalcinosis.  The patient is here because she continue with pain, worse on the right. It is not better compared to few weeks ago.  When asked, she admits that the wrists are puffy and slightly warm.  Also mildly TTP.  Symptoms are more noticeable in the morning. Review of Systems No further falls.  Denies any major injuries from the fall few weeks ago.  Past Medical History:  Diagnosis Date  . AV block, 1st degree   . CAD (coronary artery disease)    Non ST elevation 2005 ; LHC in 6/05 in setting of NSTEMI: ant apical AK and inf-apical AK, EF 29%, dOM2 70-80% (small); findings c/w Tako-Tsubo CM  . Cardiomyopathy, nonischemic ([place]) 04/2008   Likely Tako-Tsubo. Resolved. --dx'd on cath 2005. EF 29% with minimal distal CAD (small OM1 70-80).  Echo 10/09 EF 55%;  echo 01/26/12: EF 55%, mild LAE, PASP 34.  Marland Kitchen CHB (complete heart block) (HCC)    Intermittent, s/p Saint Jude PPM  . Colitis, ischemic ([place])   . Complication of anesthesia    post anesthesia excessive somnulence  . Compression fracture of C-spine ([place]) 10/10/2011   Fell at home, tx at [facility]  . Diarrhea associated with pseudomembranous colitis 6/11-17/2013   [facility]  . Dyslipidemia   . Fall    with non-healing rib fractures  . Glaucoma   . Granuloma annulare   . History of phlebitis   . Hypertension    SEVERE  . Idiopathic thrombocytopenic purpura (ITP) (HCC)   . Lower extremity edema   . Lumbar stenosis 2004   DR. MARK ROY  . Multiple pelvic fractures 05/2012   Dr Adaline Sill , [facility]  . Osteoporosis     Past Surgical History:  Procedure Laterality Date  . ABDOMINAL  HYSTERECTOMY    . APPENDECTOMY    . BACK SURGERY  10/2004   LS Disc 4-5 no sx.  Marland Kitchen CARDIAC CATHETERIZATION     12/2003  . CATARACT EXTRACTION, BILATERAL    . COLONOSCOPY  09/2005   adenoma  . COLONOSCOPY W/ POLYPECTOMY    . 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 PACEMAKER IMPLANT N/A 11/06/2016   Procedure: St Jude Pacemaker Implant;  Surgeon: Evans Lance, MD;  Location: [place] CV LAB;  Service: Cardiovascular;  Laterality: N/A;  . rectal bleed  05-01-06   colonoscopy-ischemic colitis  . ROTATOR CUFF REPAIR    . VERTEBROPLASTY     Dr Gladstone Lighter    Social History   Socioeconomic History  . Marital status: Widowed    Spouse name: Not on file  . Number of children: 1  . Years of education: Not on file  . Highest education level: Not on file  Occupational History  . Occupation: retired    Fish farm manager: RETIRED  Social Needs  . Financial resource strain: Not on file  . Food insecurity    Worry: Not on file    Inability: Not on file  . Transportation needs    Medical: Not on file    Non-medical: Not on file  Tobacco Use  . Smoking status: Never Smoker  .  Smokeless tobacco: Never Used  Substance and Sexual Activity  . Alcohol use: No  . Drug use: No  . Sexual activity: Not Currently  Lifestyle  . Physical activity    Days per week: Not on file    Minutes per session: Not on file  . Stress: Not on file  Relationships  . Social Herbalist on phone: Not on file    Gets together: Not on file    Attends religious service: Not on file    Active member of club or organization: Not on file    Attends meetings of clubs or organizations: Not on file    Relationship status: Not on file  . Intimate partner violence    Fear of current or ex partner: Not on file    Emotionally abused: Not on file    Physically abused: Not on file    Forced sexual activity: Not on file  Other Topics Concern  . Not on file  Social History Narrative   Widowed   Lives by herself  in a town house, drives    Son [name]       Allergies as of 01/17/2019      Reactions   Morphine And Related Anaphylaxis, Nausea Only   Per family, made her violently ill   Oxycodone Other (See Comments)   Dizziness    Penicillins Swelling, Other (See Comments)   Joint edema Has patient had a PCN reaction causing immediate rash, facial/tongue/throat swelling, SOB or lightheadedness with hypotension: Yes Has patient had a PCN reaction causing severe rash involving mucus membranes or skin necrosis: No Has patient had a PCN reaction that required hospitalization: No Has patient had a PCN reaction occurring within the last 10 years: No If all of the above answers are "NO", then may proceed with Cephalosporin use.   Clindamycin/lincomycin Hives, Itching   Colchicine Other (See Comments)   Hair loss   Norvasc [amlodipine Besylate] Other (See Comments)   Reaction not recalled by the patient ??   Ofloxacin Other (See Comments)   Unknown   Streptomycin Other (See Comments)   "was a long time ago" reaction not recalled   Tape Other (See Comments)   SKIN IS VERY THIN AND WILL TEAR AND BRUISE EASILY!!   Tramadol Itching   Alendronate Sodium Palpitations   Amiodarone Rash, Other (See Comments)   Possible rash and nervousness per patient   Plaquenil [hydroxychloroquine Sulfate] Rash      Medication List       Accurate as of January 17, 2019  2:59 PM. If you have any questions, ask your nurse or doctor.        beta carotene w/minerals tablet Take 1 tablet by mouth daily.   brimonidine 0.2 % ophthalmic solution Commonly known as: ALPHAGAN Place 1 drop into the right eye 2 (two) times daily.   CALCIUM 1200 PO Take 1,200 mg by mouth daily with breakfast.   carvedilol 12.5 MG tablet Commonly known as: COREG TAKE 1 1/2 TABLET TWICE DAILY (18.75 MG TOTAL)   diphenhydrAMINE 25 MG tablet Commonly known as: BENADRYL Take 25 mg by mouth at bedtime as needed for sleep (and sinus  issues).   docusate sodium 100 MG capsule Commonly known as: COLACE Take 100 mg by mouth daily as needed for mild constipation. Stool softener   furosemide 80 MG tablet Commonly known as: LASIX Take 1 tablet (80 mg total) by mouth daily.   metolazone 2.5 MG tablet Commonly  known as: ZAROXOLYN TAKE 1 TABLET BY MOUTH AS DIRECTED BY THE HEART FAILURE CLINIC   omeprazole 20 MG capsule Commonly known as: PRILOSEC Take 20 mg by mouth daily.   Osteo Bi-Flex Adv Joint Shield Tabs Take 1 tablet by mouth daily.   potassium chloride SA 20 MEQ tablet Commonly known as: K-DUR Take 1 tablet (20 mEq total) by mouth as directed. By the heart failure clinic.   Refresh Tears 0.5 % Soln Generic drug: carboxymethylcellulose Place 1 drop into both eyes 2 (two) times daily.   Rivaroxaban 15 MG Tabs tablet Commonly known as: XARELTO Take 1 tablet (15 mg total) by mouth daily with supper.   timolol 0.5 % ophthalmic solution Commonly known as: TIMOPTIC Place 1 drop into the right eye 2 (two) times daily.   vitamin B-12 1000 MCG tablet Commonly known as: CYANOCOBALAMIN Take 1,000 mcg by mouth 2 (two) times daily.   Vitamin D3 75 MCG (3000 UT) Tabs Take 3,000 Units by mouth daily.           Objective:   Physical Exam BP (!) 141/87 (BP Location: Left Arm, Patient Position: Sitting, Cuff Size: Small)   Pulse 93   Temp 97.9 F (36.6 C) (Oral)   Resp 16   Ht _0  (1.626 m)   Wt 168 lb 6 oz (76.4 kg)   SpO2 94%   BMI 28.90 kg/m   General:   Well developed, NAD, BMI noted. HEENT:  Normocephalic . Face symmetric, atraumatic MSK: Both wrists with bony changes consistent with DJD, no deformities per se. + Puffiness, more noticeable on the left.  Both wrists are slightly warm but not red. Range of motion is symmetrically slightly decreased. Skin: Not pale. Not jaundice Neurologic:  alert & oriented X3.  Speech normal, gait appropriate for age, assisted by a cane Psych--   Cognition and judgment appear intact.  Cooperative with normal attention span and concentration.  Behavior appropriate. No anxious or depressed appearing.      Assessment      Assessment   HTN severe CKD: creat ~1.4, 1.5 Dyslipidemia (not checking labs, patient refused medications) Hematology: --ITP-- hematology visit for 11-05-14, released to the care of PCP , check CBCs every 4 months, consult hematology if platelets <50 K --Pernicious anemia --Anemia of chronic disease CV: --CAD --Tako Tsubo cardiomyopathy --Diastolic  CHF- Dr Missy Sabins --AV block first-degree ; Paroxysmal Afib with RVR/Tachybrady syndrome s/p admission: implanted aSt Jude PPM 11/06/16 DJD: --Compression fractures --Lumbar stenosis Dr. Carloyn Manner --Multiple pelvic fractures , cervical spine compression  fracture 2013 [facility] --H/o nonhealing rib  --Osteoporosis: Dr. Agapito Games; s/p forteo, s/p  Prolia 09-2015 (declined to restart 11/2018). Last DEXA 09-29-16 OPHT: --Glaucoma  --S/p cornea transplant -- mac degeneration h/o granuloma annulare   H/o pseudomembranous colitis 2013  PLAN: Wrist pain: The patient complained of bilateral wrist pain, worse on the right, on exam there is some puffiness. she reports that at some point in her life a doctor suspected she had gout on the knee. DDX for his pain includes occult fracture, gout, or other crystal arthropathies or inflammatory arthritis. Recommend referral to Ortho for possibly local aspiration to document crystals and possibly therapeutic steroid injection.  She declined. At the end of a long conversation we agreed on:  Round of prednisone to help with the pain and inflammation Check a uric acid Continue Aspercreme Repeat x-ray of the right wrist to rule out occult fracture. Call if no better for Ortho (or rheumatology) referral  Today, I spent more than 25   min with the patient: >50% of the time counseling regards diagnostic and treatment options including Ortho  referral, possible aspiration, multiple questions answered.

## 2019-01-18 ENCOUNTER — Telehealth: Payer: Self-pay | Admitting: Internal Medicine

## 2019-01-18 LAB — URIC ACID: Uric Acid, Serum: 9.1 mg/dL — ABNORMAL HIGH (ref 2.4–7.0)

## 2019-01-18 NOTE — Telephone Encounter (Signed)
Please call the patient Uric acid elevated indicating possibly gout. X-ray of the right wrist showing a question of linear scaphoid fracture. Plan is to do same, essentially take prednisone, ice the area and wait.  If she is not better soon she will call for a Ortho referral.

## 2019-01-18 NOTE — Telephone Encounter (Signed)
Spoke w/ Pt- informed of results and recommendations. Pt verbalized understanding.  

## 2019-01-19 NOTE — Assessment & Plan Note (Signed)
Wrist pain: The patient complained of bilateral wrist pain, worse on the right, on exam there is some puffiness. she reports that at some point in her life a doctor suspected she had gout on the knee. DDX for his pain includes occult fracture, gout, or other crystal arthropathies or inflammatory arthritis. Recommend referral to Ortho for possibly local aspiration to document crystals and possibly therapeutic steroid injection.  She declined. At the end of a long conversation we agreed on:  Round of prednisone to help with the pain and inflammation Check a uric acid Continue Aspercreme Repeat x-ray of the right wrist to rule out occult fracture. Call if no better for Ortho (or rheumatology) referral

## 2019-01-26 ENCOUNTER — Telehealth: Payer: Self-pay

## 2019-01-26 NOTE — Telephone Encounter (Signed)
Typically, her CBGs are less than 214.  This may account for her blurry vision. Recommend to recheck blood sugar on Monday. Call if not gradually better See the eye doctor as recommended

## 2019-01-26 NOTE — Telephone Encounter (Signed)
FYI

## 2019-01-26 NOTE — Telephone Encounter (Signed)
Patient calling back stating blood sugar is 214.

## 2019-01-26 NOTE — Telephone Encounter (Signed)
Spoke with the patient, she is having bilateral field blurred vision. She cannot read but she is able to watch TV. The only thing different is that she is taking prednisone. Denies headache, amaurosis fugax. The wrist pain is much decreased with prednisone. I wonder if her CBG is elevated due to prednisone, that would explain the bilateral blurry vision. She will asked her son to check her CBG and let me know;  if unable to do she will call tomorrow for a nurse visit and we will do it here. Also recommend to see her eye doctor

## 2019-01-26 NOTE — Telephone Encounter (Signed)
Copied from [place] [id number]. Topic: General - Other >> Jan 26, 2019  8:43 AM Keene Breath wrote: Reason for CRM: Patient would like a call back from the nurse regarding her prednisone.  She stated she is having some vision issues and wanted to know if she should continue taking the medication.  Please call her as soon as possible to let her know.   CB# [phone]

## 2019-01-26 NOTE — Telephone Encounter (Signed)
Spoke w/ Pt- informed of recommendations. Pt verbalized understanding.  

## 2019-02-07 ENCOUNTER — Ambulatory Visit (INDEPENDENT_AMBULATORY_CARE_PROVIDER_SITE_OTHER): Payer: Medicare Other | Admitting: *Deleted

## 2019-02-07 DIAGNOSIS — I442 Atrioventricular block, complete: Secondary | ICD-10-CM

## 2019-02-07 LAB — CUP PACEART REMOTE DEVICE CHECK
Battery Remaining Longevity: 118 mo
Battery Remaining Percentage: 95.5 %
Battery Voltage: 2.99 V
Brady Statistic AP VP Percent: 1 %
Brady Statistic AP VS Percent: 1 %
Brady Statistic AS VP Percent: 12 %
Brady Statistic AS VS Percent: 87 %
Brady Statistic RA Percent Paced: 1 %
Brady Statistic RV Percent Paced: 12 %
Date Time Interrogation Session: 20200728060013
Implantable Lead Implant Date: 20180427
Implantable Lead Implant Date: 20180427
Implantable Lead Location: 753859
Implantable Lead Location: 753860
Implantable Pulse Generator Implant Date: 20180427
Lead Channel Impedance Value: 430 Ohm
Lead Channel Impedance Value: 490 Ohm
Lead Channel Pacing Threshold Amplitude: 0.5 V
Lead Channel Pacing Threshold Amplitude: 1 V
Lead Channel Pacing Threshold Pulse Width: 0.5 ms
Lead Channel Pacing Threshold Pulse Width: 0.5 ms
Lead Channel Sensing Intrinsic Amplitude: 0.8 mV
Lead Channel Sensing Intrinsic Amplitude: 11.7 mV
Lead Channel Setting Pacing Amplitude: 2.5 V
Lead Channel Setting Pacing Amplitude: 2.5 V
Lead Channel Setting Pacing Pulse Width: 0.5 ms
Lead Channel Setting Sensing Sensitivity: 2 mV
Pulse Gen Model: 2272
Pulse Gen Serial Number: 8900487

## 2019-02-20 ENCOUNTER — Encounter: Payer: Self-pay | Admitting: Cardiology

## 2019-02-20 NOTE — Progress Notes (Signed)
Remote pacemaker transmission.   

## 2019-02-28 ENCOUNTER — Other Ambulatory Visit: Payer: Self-pay

## 2019-02-28 ENCOUNTER — Ambulatory Visit: Payer: Medicare Other | Admitting: Internal Medicine

## 2019-03-01 ENCOUNTER — Encounter: Payer: Self-pay | Admitting: Internal Medicine

## 2019-03-01 ENCOUNTER — Ambulatory Visit (INDEPENDENT_AMBULATORY_CARE_PROVIDER_SITE_OTHER): Payer: Medicare Other | Admitting: Internal Medicine

## 2019-03-01 ENCOUNTER — Telehealth: Payer: Self-pay | Admitting: Internal Medicine

## 2019-03-01 VITALS — BP 146/85 | HR 88 | Temp 98.2°F | Resp 18 | Ht 64.0 in | Wt 169.2 lb

## 2019-03-01 DIAGNOSIS — I2510 Atherosclerotic heart disease of native coronary artery without angina pectoris: Secondary | ICD-10-CM | POA: Diagnosis not present

## 2019-03-01 DIAGNOSIS — I5033 Acute on chronic diastolic (congestive) heart failure: Secondary | ICD-10-CM | POA: Diagnosis not present

## 2019-03-01 DIAGNOSIS — M109 Gout, unspecified: Secondary | ICD-10-CM

## 2019-03-01 MED ORDER — COLCRYS 0.6 MG PO TABS: 0.6000 mg | ORAL_TABLET | Freq: Two times a day (BID) | ORAL | 1 refills | Status: DC | PRN

## 2019-03-01 MED ORDER — FEBUXOSTAT 40 MG PO TABS: 40.0000 mg | ORAL_TABLET | Freq: Every day | ORAL | 1 refills | Status: DC

## 2019-03-01 NOTE — Progress Notes (Signed)
Pre visit review using our clinic review tool, if applicable. No additional management support is needed unless otherwise documented below in the visit note. 

## 2019-03-01 NOTE — Progress Notes (Signed)
Subjective:    Patient ID: [name], female    DOB: [birth year], 85 y.o.   MRN: [id number]  DOS:  03/01/2019 Type of visit - description: Acute visit Was recently seen with bilateral wrist pain, felt to be gout. Was prescribed prednisone. Prednisone decreased the swelling at the wrists, helped the pain to some extent. Was not able to finish the round of prednisone due to blurred  vision.  She is here because the swelling is gradually coming back and the pain is severe, worse on the right.  Also, I noted some lower extremity edema above baseline, she states that that has been the case since she took prednisone.  Wt Readings from Last 3 Encounters:  03/01/19 169 lb 4 oz (76.8 kg)  01/17/19 168 lb 6 oz (76.4 kg)  12/30/18 165 lb (74.8 kg)     Review of Systems Denies fever chills No chest pain Has episodic shortness of breath at baseline When asked, admits to some orthopnea in the last few days. No cough  Past Medical History:  Diagnosis Date  . AV block, 1st degree   . CAD (coronary artery disease)    Non ST elevation 2005 ; LHC in 6/05 in setting of NSTEMI: ant apical AK and inf-apical AK, EF 29%, dOM2 70-80% (small); findings c/w Tako-Tsubo CM  . Cardiomyopathy, nonischemic ([place]) 04/2008   Likely Tako-Tsubo. Resolved. --dx'd on cath 2005. EF 29% with minimal distal CAD (small OM1 70-80).  Echo 10/09 EF 55%;  echo 01/26/12: EF 55%, mild LAE, PASP 34.  Marland Kitchen CHB (complete heart block) (HCC)    Intermittent, s/p Saint Jude PPM  . Colitis, ischemic ([place])   . Complication of anesthesia    post anesthesia excessive somnulence  . Compression fracture of C-spine ([place]) 10/10/2011   Fell at home, tx at [facility]  . Diarrhea associated with pseudomembranous colitis 6/11-17/2013   [facility]  . Dyslipidemia   . Fall    with non-healing rib fractures  . Glaucoma   . Granuloma annulare   . History of phlebitis   . Hypertension    SEVERE  . Idiopathic thrombocytopenic  purpura (ITP) (HCC)   . Lower extremity edema   . Lumbar stenosis 2004   DR. MARK ROY  . Multiple pelvic fractures 05/2012   Dr Adaline Sill [facility]  . Osteoporosis     Past Surgical History:  Procedure Laterality Date  . ABDOMINAL HYSTERECTOMY    . APPENDECTOMY    . BACK SURGERY  10/2004   LS Disc 4-5 no sx.  Marland Kitchen CARDIAC CATHETERIZATION     12/2003  . CATARACT EXTRACTION, BILATERAL    . COLONOSCOPY  09/2005   adenoma  . COLONOSCOPY W/ POLYPECTOMY    . 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 PACEMAKER IMPLANT N/A 11/06/2016   Procedure: St Jude Pacemaker Implant;  Surgeon: Evans Lance, MD;  Location: [place] CV LAB;  Service: Cardiovascular;  Laterality: N/A;  . rectal bleed  05-01-06   colonoscopy-ischemic colitis  . ROTATOR CUFF REPAIR    . VERTEBROPLASTY     Dr Gladstone Lighter    Social History   Socioeconomic History  . Marital status: Widowed    Spouse name: Not on file  . Number of children: 1  . Years of education: Not on file  . Highest education level: Not on file  Occupational History  . Occupation: retired    Fish farm manager: RETIRED  Social Needs  . Financial resource strain:  Not on file  . Food insecurity    Worry: Not on file    Inability: Not on file  . Transportation needs    Medical: Not on file    Non-medical: Not on file  Tobacco Use  . Smoking status: Never Smoker  . Smokeless tobacco: Never Used  Substance and Sexual Activity  . Alcohol use: No  . Drug use: No  . Sexual activity: Not Currently  Lifestyle  . Physical activity    Days per week: Not on file    Minutes per session: Not on file  . Stress: Not on file  Relationships  . Social Herbalist on phone: Not on file    Gets together: Not on file    Attends religious service: Not on file    Active member of club or organization: Not on file    Attends meetings of clubs or organizations: Not on file    Relationship status: Not on file  . Intimate partner violence    Fear of  current or ex partner: Not on file    Emotionally abused: Not on file    Physically abused: Not on file    Forced sexual activity: Not on file  Other Topics Concern  . Not on file  Social History Narrative   Widowed   Lives by herself in a town house, drives    Son Dimitri Dsouza       Allergies as of 03/01/2019      Reactions   Morphine And Related Anaphylaxis, Nausea Only   Per family, made her violently ill   Oxycodone Other (See Comments)   Dizziness    Penicillins Swelling, Other (See Comments)   Joint edema Has patient had a PCN reaction causing immediate rash, facial/tongue/throat swelling, SOB or lightheadedness with hypotension: Yes Has patient had a PCN reaction causing severe rash involving mucus membranes or skin necrosis: No Has patient had a PCN reaction that required hospitalization: No Has patient had a PCN reaction occurring within the last 10 years: No If all of the above answers are "NO", then may proceed with Cephalosporin use.   Clindamycin/lincomycin Hives, Itching   Colchicine Other (See Comments)   Hair loss   Norvasc [amlodipine Besylate] Other (See Comments)   Reaction not recalled by the patient ??   Ofloxacin Other (See Comments)   Unknown   Streptomycin Other (See Comments)   "was a long time ago" reaction not recalled   Tape Other (See Comments)   SKIN IS VERY THIN AND WILL TEAR AND BRUISE EASILY!!   Tramadol Itching   Alendronate Sodium Palpitations   Amiodarone Rash, Other (See Comments)   Possible rash and nervousness per patient   Plaquenil [hydroxychloroquine Sulfate] Rash      Medication List       Accurate as of March 01, 2019 11:59 PM. If you have any questions, ask your nurse or doctor.        STOP taking these medications   predniSONE 10 MG tablet Commonly known as: DELTASONE Stopped by: Kathlene November, MD     TAKE these medications   beta carotene w/minerals tablet Take 1 tablet by mouth daily.   brimonidine 0.2 % ophthalmic  solution Commonly known as: ALPHAGAN Place 1 drop into the right eye 2 (two) times daily.   CALCIUM 1200 PO Take 1,200 mg by mouth daily with breakfast.   carvedilol 12.5 MG tablet Commonly known as: COREG TAKE 1 1/2 TABLET TWICE DAILY (  18.75 MG TOTAL)   Colcrys 0.6 MG tablet Generic drug: colchicine Take 1 tablet (0.6 mg total) by mouth 2 (two) times daily as needed. Started by: Kathlene November, MD   diphenhydrAMINE 25 MG tablet Commonly known as: BENADRYL Take 25 mg by mouth at bedtime as needed for sleep (and sinus issues).   docusate sodium 100 MG capsule Commonly known as: COLACE Take 100 mg by mouth daily as needed for mild constipation. Stool softener   febuxostat 40 MG tablet Commonly known as: Uloric Take 1 tablet (40 mg total) by mouth daily. Started by: Kathlene November, MD   Lasix 40 MG tablet Generic drug: furosemide Take 2 tablets (80 mg total) by mouth daily. What changed: Another medication with the same name was removed. Continue taking this medication, and follow the directions you see here. Changed by: Kathlene November, MD   metolazone 2.5 MG tablet Commonly known as: ZAROXOLYN TAKE 1 TABLET BY MOUTH AS DIRECTED BY THE HEART FAILURE CLINIC   omeprazole 20 MG capsule Commonly known as: PRILOSEC Take 20 mg by mouth daily.   Osteo Bi-Flex Adv Joint Shield Tabs Take 1 tablet by mouth daily.   potassium chloride SA 20 MEQ tablet Commonly known as: K-DUR Take 1 tablet (20 mEq total) by mouth as directed. By the heart failure clinic.   Refresh Tears 0.5 % Soln Generic drug: carboxymethylcellulose Place 1 drop into both eyes 2 (two) times daily.   Rivaroxaban 15 MG Tabs tablet Commonly known as: XARELTO Take 1 tablet (15 mg total) by mouth daily with supper.   timolol 0.5 % ophthalmic solution Commonly known as: TIMOPTIC Place 1 drop into the right eye 2 (two) times daily.   vitamin B-12 1000 MCG tablet Commonly known as: CYANOCOBALAMIN Take 1,000 mcg by mouth 2  (two) times daily.   Vitamin D3 75 MCG (3000 UT) Tabs Take 3,000 Units by mouth daily.           Objective:   Physical Exam BP (!) 146/85 (BP Location: Left Arm, Patient Position: Sitting, Cuff Size: Small)   Pulse 88   Temp 98.2 F (36.8 C) (Temporal)   Resp 18   Ht _0  (1.626 m)   Wt 169 lb 4 oz (76.8 kg)   SpO2 97%   BMI 29.05 kg/m  General:   Well developed, NAD, BMI noted. HEENT:  Normocephalic . Face symmetric, atraumatic Neck: + JVD at 45 degrees Lungs:  Few crackles at bases with of increased respiratory effort, no intercostal retractions, no accessory muscle use. Heart: Irregular,  no murmur.  +/+++  pretibial edema bilaterally to the knee. Skin: Not pale. Not jaundice MSK:  Both wrists are puffy, suspect effusion.  The right side is TTP more so than the left. Also, at the right palmar aspect has palpable cord Neurologic:  alert & oriented X3.  Speech normal, gait appropriate for age and unassisted Psych--  Cognition and judgment appear intact.  Cooperative with normal attention span and concentration.  Behavior appropriate. No anxious or depressed appearing.      Assessment     Assessment   HTN severe CKD: creat ~1.4, 1.5 Dyslipidemia (not checking labs, patient refused medications) Hematology: --ITP-- hematology visit for 11-05-14, released to the care of PCP , check CBCs every 4 months, consult hematology if platelets <50 K --Pernicious anemia --Anemia of chronic disease CV: --CAD --Tako Tsubo cardiomyopathy --Diastolic  CHF- Dr Missy Sabins --AV block first-degree ; Paroxysmal Afib with RVR/Tachybrady syndrome s/p admission: implanted aSt Jude  PPM 11/06/16 DJD: --Compression fractures --Lumbar stenosis Dr. Carloyn Manner --Multiple pelvic fractures , cervical spine compression  fracture 2013 [facility] --H/o nonhealing rib  --Osteoporosis: Dr. Agapito Games; s/p forteo, s/p  Prolia 09-2015 (declined to restart 11/2018). Last DEXA 09-29-16 OPHT: --Glaucoma  --S/p  cornea transplant -- mac degeneration h/o granuloma annulare   H/o pseudomembranous colitis 2013  PLAN: Gout: See last visit, was seen with wrist pain R>L , uric acid was elevated, was Rx a round of steroids, they helped the symptoms but could not finish prednisone due to blurred vision. Has gained weight since. On exam today she still has puffiness in both wrist, tenderness on the right side. Plan: Start Uloric,  ice, rheumatology referral for confirmation.  history of colchicine allergy Avoid prednisone for now Will be unable to avoid diuretics, see next Acute on chronic CHF: Compared to the last visit, she seems to be a volume overloaded (crackles, increased edema, + JVD).  Per her own scales, weight has increased from 161pounds to 166 pounds approximately Reports she is intolerant to Zaroxolyn and is not taking it.   Plan:  BMP, BNP and CBC Increase Lasix: Current dose is 40 mg 2 tablets in the morning, new dose: 2 tablets in the morning , 1 in the afternoon until she is seen by cardiology Refer to cardiology for further medication adjustment if possible.  Daily weights Cord, right hand: See physical exam, observation RTC 2 weeks

## 2019-03-01 NOTE — Patient Instructions (Addendum)
GO TO THE LAB : Get the blood work    GO TO THE FRONT DESK Schedule your next appointment   for a checkup in 2 weeks  Gout: ICE Uloric 40 mg 1 tablet daily Colchicine twice a day until better Rheumatology referral  Heart failure Increase Lasix 80 mg: 1 in the morning as usual, half tablet in the afternoon Cardiology referral Weight yourself daily

## 2019-03-01 NOTE — Telephone Encounter (Signed)
LM to schedule 2 week f/u and lab appt

## 2019-03-02 ENCOUNTER — Telehealth: Payer: Self-pay

## 2019-03-02 LAB — CBC WITH DIFFERENTIAL/PLATELET
Basophils Absolute: 0 10*3/uL (ref 0.0–0.1)
Basophils Relative: 0.8 % (ref 0.0–3.0)
Eosinophils Absolute: 0.2 10*3/uL (ref 0.0–0.7)
Eosinophils Relative: 2.9 % (ref 0.0–5.0)
HCT: 30.4 % — ABNORMAL LOW (ref 36.0–46.0)
Hemoglobin: 10.2 g/dL — ABNORMAL LOW (ref 12.0–15.0)
Lymphocytes Relative: 18.4 % (ref 12.0–46.0)
Lymphs Abs: 1 10*3/uL (ref 0.7–4.0)
MCHC: 33.5 g/dL (ref 30.0–36.0)
MCV: 92.7 fl (ref 78.0–100.0)
Monocytes Absolute: 0.5 10*3/uL (ref 0.1–1.0)
Monocytes Relative: 8.4 % (ref 3.0–12.0)
Neutro Abs: 4 10*3/uL (ref 1.4–7.7)
Neutrophils Relative %: 69.5 % (ref 43.0–77.0)
Platelets: 56 10*3/uL — ABNORMAL LOW (ref 150.0–400.0)
RBC: 3.28 Mil/uL — ABNORMAL LOW (ref 3.87–5.11)
RDW: 15.5 % (ref 11.5–15.5)
WBC: 5.7 10*3/uL (ref 4.0–10.5)

## 2019-03-02 LAB — BASIC METABOLIC PANEL
BUN: 21 mg/dL (ref 6–23)
CO2: 26 mEq/L (ref 19–32)
Calcium: 8.8 mg/dL (ref 8.4–10.5)
Chloride: 105 mEq/L (ref 96–112)
Creatinine, Ser: 1.41 mg/dL — ABNORMAL HIGH (ref 0.40–1.20)
GFR: 34.7 mL/min — ABNORMAL LOW (ref >60.00)
Glucose, Bld: 125 mg/dL — ABNORMAL HIGH (ref 70–99)
Potassium: 3.7 mEq/L (ref 3.5–5.1)
Sodium: 142 mEq/L (ref 135–145)

## 2019-03-02 LAB — BRAIN NATRIURETIC PEPTIDE: Pro B Natriuretic peptide (BNP): 479 pg/mL — ABNORMAL HIGH (ref 0.0–100.0)

## 2019-03-02 MED ORDER — ALLOPURINOL 100 MG PO TABS: 100.0000 mg | ORAL_TABLET | Freq: Every day | ORAL | 1 refills | Status: DC

## 2019-03-02 NOTE — Telephone Encounter (Signed)
Copied from [place] [id number]. Topic: Quick Communication - See Telephone Encounter >> Mar 02, 2019  9:32 AM Loma Boston wrote: CRM for notification. See Telephone encounter for: 03/02/19. Pt was given two scripts yesterday and febuxostat (ULORIC) 40 MG tablet is $57, nurse told her to call back if it was too expensive. Also she was given a script yesterday of Colchicine and she is allergic to it. Please call her back.

## 2019-03-02 NOTE — Telephone Encounter (Signed)
LMOM for Pt informed of recommendations. Allopurinol 100mg  sent to pharmacy. Instructed Pt to call if questions/concerns.

## 2019-03-02 NOTE — Telephone Encounter (Signed)
Advise patient: My apologies about colchicine. If there is any way she could get  Uloric it will be ideal. If unable to,  then call: Allopurinol 100 mg 1 tablet daily #30 and 1 refills. Watch for side effects including a rash.

## 2019-03-02 NOTE — Assessment & Plan Note (Signed)
Gout: See last visit, was seen with wrist pain R>L , uric acid was elevated, was Rx a round of steroids, they helped the symptoms but could not finish prednisone due to blurred vision. Has gained weight since. On exam today she still has puffiness in both wrist, tenderness on the right side. Plan: Start Uloric,  ice, rheumatology referral for confirmation.  history of colchicine allergy Avoid prednisone for now Will be unable to avoid diuretics, see next Acute on chronic CHF: Compared to the last visit, she seems to be a volume overloaded (crackles, increased edema, + JVD).  Per her own scales, weight has increased from 161pounds to 166 pounds approximately Reports she is intolerant to Zaroxolyn and is not taking it.   Plan:  BMP, BNP and CBC Increase Lasix: Current dose is 40 mg 2 tablets in the morning, new dose: 2 tablets in the morning , 1 in the afternoon until she is seen by cardiology Refer to cardiology for further medication adjustment if possible.  Daily weights Cord, right hand: See physical exam, observation RTC 2 weeks

## 2019-03-14 ENCOUNTER — Ambulatory Visit (INDEPENDENT_AMBULATORY_CARE_PROVIDER_SITE_OTHER): Payer: Medicare Other | Admitting: Internal Medicine

## 2019-03-14 ENCOUNTER — Encounter: Payer: Self-pay | Admitting: Internal Medicine

## 2019-03-14 ENCOUNTER — Other Ambulatory Visit: Payer: Self-pay

## 2019-03-14 VITALS — BP 142/80 | HR 102 | Ht 64.0 in | Wt 170.0 lb

## 2019-03-14 DIAGNOSIS — I2510 Atherosclerotic heart disease of native coronary artery without angina pectoris: Secondary | ICD-10-CM

## 2019-03-14 DIAGNOSIS — I482 Chronic atrial fibrillation, unspecified: Secondary | ICD-10-CM | POA: Insufficient documentation

## 2019-03-14 DIAGNOSIS — I442 Atrioventricular block, complete: Secondary | ICD-10-CM | POA: Diagnosis not present

## 2019-03-14 DIAGNOSIS — Z950 Presence of cardiac pacemaker: Secondary | ICD-10-CM

## 2019-03-14 DIAGNOSIS — I5032 Chronic diastolic (congestive) heart failure: Secondary | ICD-10-CM

## 2019-03-14 DIAGNOSIS — I4891 Unspecified atrial fibrillation: Secondary | ICD-10-CM | POA: Diagnosis not present

## 2019-03-14 MED ORDER — WARFARIN SODIUM 3 MG PO TABS: 3.0000 mg | ORAL_TABLET | Freq: Every day | ORAL | 11 refills | Status: DC

## 2019-03-14 MED ORDER — TORSEMIDE 20 MG PO TABS: 40.0000 mg | ORAL_TABLET | Freq: Two times a day (BID) | ORAL | 3 refills | Status: DC

## 2019-03-14 NOTE — Patient Instructions (Addendum)
Medication Instructions:  Your physician has recommended you make the following change in your medication:   1.  Stop taking furosemide (lasix)  2.  Start taking torsemide 20 mg-  Take 2 tablets (40 mg) by mouth twice a day  3.  Start taking WARFARIN tonight 03/14/2019-  3 mg by mouth nightly.  4.  You will STOP taking XARELTO on March 16, 2019 that night.  March 16, 2019 you will only take WARFARIN   Labwork: None ordered.  Testing/Procedures: None ordered.  Follow-Up:  Please schedule follow up with COUMADIN clinic in one week after changing from [place] to [facility].  Your physician wants you to follow-up in: one year with Dr. Lovena Le.   You will receive a reminder letter in the mail two months in advance. If you don't receive a letter, please call our office to schedule the follow-up appointment.  Remote monitoring is used to monitor your Pacemaker from home. This monitoring reduces the number of office visits required to check your device to one time per year. It allows [place] to keep an eye on the functioning of your device to ensure it is working properly. You are scheduled for a device check from home on 05/09/2019. You may send your transmission at any time that day. If you have a wireless device, the transmission will be sent automatically. After your physician reviews your transmission, you will receive a postcard with your next transmission date.  Any Other Special Instructions Will Be Listed Below (If Applicable).  If you need a refill on your cardiac medications before your next appointment, please call your pharmacy.

## 2019-03-14 NOTE — Progress Notes (Signed)
HPI Mrs. [name] returns today for ongoing evaluation and management of her atrial fib. When I saw her a year ago we had her start amiodarone but she was intolerant and stopped her meds. She has been stable with a strategy of rate control. She notes that her legs are swollen at times. No trauma. She has dyspnea with exertion and fatigue.  Allergies  Allergen Reactions  . Morphine And Related Anaphylaxis and Nausea Only    Per family, made her violently ill  . Oxycodone Other (See Comments)    Dizziness   . Penicillins Swelling and Other (See Comments)    Joint edema Has patient had a PCN reaction causing immediate rash, facial/tongue/throat swelling, SOB or lightheadedness with hypotension: Yes Has patient had a PCN reaction causing severe rash involving mucus membranes or skin necrosis: No Has patient had a PCN reaction that required hospitalization: No Has patient had a PCN reaction occurring within the last 10 years: No If all of the above answers are "NO", then may proceed with Cephalosporin use.   . Clindamycin/Lincomycin Hives and Itching  . Colchicine Other (See Comments)    Hair loss  . Norvasc [Amlodipine Besylate] Other (See Comments)    Reaction not recalled by the patient ??  . Ofloxacin Other (See Comments)    Unknown   . Streptomycin Other (See Comments)    "was a long time ago" reaction not recalled  . Tape Other (See Comments)    SKIN IS VERY THIN AND WILL TEAR AND BRUISE EASILY!!  . Tramadol Itching  . Alendronate Sodium Palpitations  . Amiodarone Rash and Other (See Comments)    Possible rash and nervousness per patient  . Plaquenil [Hydroxychloroquine Sulfate] Rash     Current Outpatient Medications  Medication Sig Dispense Refill  . allopurinol (ZYLOPRIM) 100 MG tablet Take 1 tablet (100 mg total) by mouth daily. 30 tablet 1  . beta carotene w/minerals (OCUVITE) tablet Take 1 tablet by mouth daily.     . brimonidine (ALPHAGAN) 0.2 % ophthalmic  solution Place 1 drop into the right eye 2 (two) times daily.      . Calcium Carbonate-Vit D-Min (CALCIUM 1200 PO) Take 1,200 mg by mouth daily with breakfast.    . carboxymethylcellulose (REFRESH TEARS) 0.5 % SOLN Place 1 drop into both eyes 2 (two) times daily.     . carvedilol (COREG) 12.5 MG tablet TAKE 1 1/2 TABLET TWICE DAILY (18.75 MG TOTAL) 270 tablet 1  . Cholecalciferol (VITAMIN D3) 3000 UNITS TABS Take 3,000 Units by mouth daily.     . diphenhydrAMINE (BENADRYL) 25 MG tablet Take 25 mg by mouth at bedtime as needed for sleep (and sinus issues).     . docusate sodium (COLACE) 100 MG capsule Take 100 mg by mouth daily as needed for mild constipation. Stool softener     . metolazone (ZAROXOLYN) 2.5 MG tablet TAKE 1 TABLET BY MOUTH AS DIRECTED BY THE HEART FAILURE CLINIC 60 tablet 1  . Misc Natural Products (OSTEO BI-FLEX ADV JOINT SHIELD) TABS Take 1 tablet by mouth daily.     Marland Kitchen omeprazole (PRILOSEC) 20 MG capsule Take 20 mg by mouth daily.      . potassium chloride SA (K-DUR) 20 MEQ tablet Take 1 tablet (20 mEq total) by mouth as directed. By the heart failure clinic. 90 tablet 3  . timolol (TIMOPTIC) 0.5 % ophthalmic solution Place 1 drop into the right eye 2 (two) times daily.  5  .  vitamin B-12 (CYANOCOBALAMIN) 1000 MCG tablet Take 1,000 mcg by mouth 2 (two) times daily.     Marland Kitchen torsemide (DEMADEX) 20 MG tablet Take 2 tablets (40 mg total) by mouth 2 (two) times daily. 360 tablet 3  . warfarin (COUMADIN) 3 MG tablet Take 1 tablet (3 mg total) by mouth daily. 30 tablet 11   No current facility-administered medications for this visit.      Past Medical History:  Diagnosis Date  . AV block, 1st degree   . CAD (coronary artery disease)    Non ST elevation 2005 ; LHC in 6/05 in setting of NSTEMI: ant apical AK and inf-apical AK, EF 29%, dOM2 70-80% (small); findings c/w Tako-Tsubo CM  . Cardiomyopathy, nonischemic ([place]) 04/2008   Likely Tako-Tsubo. Resolved. --dx'd on cath 2005. EF 29%  with minimal distal CAD (small OM1 70-80).  Echo 10/09 EF 55%;  echo 01/26/12: EF 55%, mild LAE, PASP 34.  Marland Kitchen CHB (complete heart block) (HCC)    Intermittent, s/p Saint Jude PPM  . Colitis, ischemic ([place])   . Complication of anesthesia    post anesthesia excessive somnulence  . Compression fracture of C-spine ([place]) 10/10/2011   Fell at home, tx at [facility]  . Diarrhea associated with pseudomembranous colitis 6/11-17/2013   [facility]  . Dyslipidemia   . Fall    with non-healing rib fractures  . Glaucoma   . Granuloma annulare   . History of phlebitis   . Hypertension    SEVERE  . Idiopathic thrombocytopenic purpura (ITP) (HCC)   . Lower extremity edema   . Lumbar stenosis 2004   DR. MARK ROY  . Multiple pelvic fractures 05/2012   Dr Adaline Sill , [facility]  . Osteoporosis     ROS:   All systems reviewed and negative except as noted in the HPI.   Past Surgical History:  Procedure Laterality Date  . ABDOMINAL HYSTERECTOMY    . APPENDECTOMY    . BACK SURGERY  10/2004   LS Disc 4-5 no sx.  Marland Kitchen CARDIAC CATHETERIZATION     12/2003  . CATARACT EXTRACTION, BILATERAL    . COLONOSCOPY  09/2005   adenoma  . COLONOSCOPY W/ POLYPECTOMY    . 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 PACEMAKER IMPLANT N/A 11/06/2016   Procedure: St Jude Pacemaker Implant;  Surgeon: Evans Lance, MD;  Location: [place] CV LAB;  Service: Cardiovascular;  Laterality: N/A;  . rectal bleed  05-01-06   colonoscopy-ischemic colitis  . ROTATOR CUFF REPAIR    . VERTEBROPLASTY     Dr Gladstone Lighter     Family History  Problem Relation Age of Onset  . Coronary artery disease Mother   . Heart failure Mother   . Renal cancer Mother   . Kidney disease Mother   . Stroke Father        in his 82s  . Colon cancer Father   . Breast cancer Sister         X 2  . Rectal cancer Sister   . Breast cancer Sister   . Diabetes Neg Hx      Social History   Socioeconomic History  . Marital status: Widowed     Spouse name: Not on file  . Number of children: 1  . Years of education: Not on file  . Highest education level: Not on file  Occupational History  . Occupation: retired    Fish farm manager: RETIRED  Social Needs  . Financial  resource strain: Not on file  . Food insecurity    Worry: Not on file    Inability: Not on file  . Transportation needs    Medical: Not on file    Non-medical: Not on file  Tobacco Use  . Smoking status: Never Smoker  . Smokeless tobacco: Never Used  Substance and Sexual Activity  . Alcohol use: No  . Drug use: No  . Sexual activity: Not Currently  Lifestyle  . Physical activity    Days per week: Not on file    Minutes per session: Not on file  . Stress: Not on file  Relationships  . Social Herbalist on phone: Not on file    Gets together: Not on file    Attends religious service: Not on file    Active member of club or organization: Not on file    Attends meetings of clubs or organizations: Not on file    Relationship status: Not on file  . Intimate partner violence    Fear of current or ex partner: Not on file    Emotionally abused: Not on file    Physically abused: Not on file    Forced sexual activity: Not on file  Other Topics Concern  . Not on file  Social History Narrative   Widowed   Lives by herself in a town house, drives    Son Dimitri Hinderman      BP (!) 142/80   Pulse (!) 102   Ht 5\' 4"  (1.626 m)   Wt 170 lb (77.1 kg)   SpO2 99%   BMI 29.18 kg/m   Physical Exam:  Well appearing NAD HEENT: Unremarkable Neck:  No JVD, no thyromegally Lymphatics:  No adenopathy Back:  No CVA tenderness Lungs:  Clear with basilar rales HEART:  Regular rate rhythm, no murmurs, no rubs, no clicks Abd:  soft, positive bowel sounds, no organomegally, no rebound, no guarding Ext:  2 plus pulses, 2+ peripheral edema, no cyanosis, no clubbing Skin:  No rashes no nodules Neuro:  CN II through XII intact, motor grossly intact  DEVICE  Normal  device function.  See PaceArt for details.   Assess/Plan: 1. Chronic diastolic heart failure - her symptoms have worsened and she is volume overloaded despite escalating doses of lasix. I have recommended she start torsemide 40 bid. She will continue metolazone. 2. Atrial fib - her rate is well controlled. No change in meds. 3. PPM - her St. Jude device has been reprogrammed to VVIR as she is chronically in atrial fib. 4. HTN - her SBP is up a bit. She is encouraged to reduce her salt intake.  Mikle Bosworth.D.

## 2019-03-15 ENCOUNTER — Ambulatory Visit: Payer: Medicare Other | Admitting: Internal Medicine

## 2019-03-23 ENCOUNTER — Ambulatory Visit (INDEPENDENT_AMBULATORY_CARE_PROVIDER_SITE_OTHER): Payer: Medicare Other | Admitting: *Deleted

## 2019-03-23 ENCOUNTER — Other Ambulatory Visit: Payer: Self-pay

## 2019-03-23 DIAGNOSIS — I4891 Unspecified atrial fibrillation: Secondary | ICD-10-CM

## 2019-03-23 DIAGNOSIS — Z5181 Encounter for therapeutic drug level monitoring: Secondary | ICD-10-CM | POA: Diagnosis not present

## 2019-03-23 LAB — POCT INR: INR: 2.1 (ref 2.0–3.0)

## 2019-03-23 NOTE — Patient Instructions (Addendum)
A full discussion of the nature of anticoagulants has been carried out.  A benefit risk analysis has been presented to the patient, so that they understand the justification for choosing anticoagulation at this time. The need for frequent and regular monitoring, precise dosage adjustment and compliance is stressed.  Side effects of potential bleeding are discussed.  The patient should avoid any OTC items containing aspirin or ibuprofen, and should avoid great swings in general diet.  Avoid alcohol consumption.  Call if any signs of abnormal bleeding.  Description   Start taking 1/2 a tablet daily except for 1 tablet on Monday, Wednesday and Friday. Call the coumadin Clinic if for any changes in medication or upcoming procedures. [facility]in Clinic [phone]

## 2019-03-25 ENCOUNTER — Other Ambulatory Visit: Payer: Self-pay | Admitting: Internal Medicine

## 2019-03-30 ENCOUNTER — Other Ambulatory Visit: Payer: Self-pay

## 2019-03-30 ENCOUNTER — Ambulatory Visit (INDEPENDENT_AMBULATORY_CARE_PROVIDER_SITE_OTHER): Payer: Medicare Other | Admitting: Pharmacist

## 2019-03-30 DIAGNOSIS — Z5181 Encounter for therapeutic drug level monitoring: Secondary | ICD-10-CM

## 2019-03-30 DIAGNOSIS — I4891 Unspecified atrial fibrillation: Secondary | ICD-10-CM

## 2019-03-30 LAB — POCT INR: INR: 2.5 (ref 2.0–3.0)

## 2019-03-30 NOTE — Patient Instructions (Signed)
Description   Continue taking 1/2 tablet daily except for 1 tablet on Monday, Wednesday and Friday. Call the coumadin Clinic if for any changes in medication or upcoming procedures. [facility]in Clinic [phone]

## 2019-04-05 ENCOUNTER — Other Ambulatory Visit: Payer: Self-pay

## 2019-04-05 ENCOUNTER — Ambulatory Visit (INDEPENDENT_AMBULATORY_CARE_PROVIDER_SITE_OTHER): Payer: Medicare Other | Admitting: *Deleted

## 2019-04-05 DIAGNOSIS — Z5181 Encounter for therapeutic drug level monitoring: Secondary | ICD-10-CM

## 2019-04-05 DIAGNOSIS — I4891 Unspecified atrial fibrillation: Secondary | ICD-10-CM

## 2019-04-05 LAB — POCT INR: INR: 1.9 — AB (ref 2.0–3.0)

## 2019-04-05 NOTE — Patient Instructions (Addendum)
Description   Tomorrow take 1 tablet then continue taking 1/2 tablet daily except for 1 tablet on Monday, Wednesday and Friday. Recheck INR in 1 week. Call the coumadin Clinic if for any changes in medication or upcoming procedures. Coumadin Clinic [phone] Main [phone]

## 2019-04-10 ENCOUNTER — Ambulatory Visit: Payer: Medicare Other | Admitting: Internal Medicine

## 2019-04-11 DIAGNOSIS — M25531 Pain in right wrist: Secondary | ICD-10-CM | POA: Diagnosis not present

## 2019-04-11 DIAGNOSIS — R200 Anesthesia of skin: Secondary | ICD-10-CM | POA: Diagnosis not present

## 2019-04-11 DIAGNOSIS — R202 Paresthesia of skin: Secondary | ICD-10-CM | POA: Diagnosis not present

## 2019-04-13 ENCOUNTER — Ambulatory Visit (INDEPENDENT_AMBULATORY_CARE_PROVIDER_SITE_OTHER): Payer: Medicare Other | Admitting: *Deleted

## 2019-04-13 ENCOUNTER — Other Ambulatory Visit: Payer: Self-pay

## 2019-04-13 DIAGNOSIS — I4891 Unspecified atrial fibrillation: Secondary | ICD-10-CM

## 2019-04-13 DIAGNOSIS — Z5181 Encounter for therapeutic drug level monitoring: Secondary | ICD-10-CM | POA: Diagnosis not present

## 2019-04-13 LAB — POCT INR: INR: 2.7 (ref 2.0–3.0)

## 2019-04-13 NOTE — Patient Instructions (Signed)
Description   Continue taking 1/2 tablet daily except for 1 tablet on Monday, Wednesday and Friday. Recheck INR in 2 weeks. Call the coumadin Clinic if for any changes in medication or upcoming procedures. [facility]madin Clinic [phone] Main [phone]

## 2019-04-17 ENCOUNTER — Other Ambulatory Visit: Payer: Self-pay

## 2019-04-18 ENCOUNTER — Ambulatory Visit (INDEPENDENT_AMBULATORY_CARE_PROVIDER_SITE_OTHER): Payer: Medicare Other | Admitting: Internal Medicine

## 2019-04-18 DIAGNOSIS — I2510 Atherosclerotic heart disease of native coronary artery without angina pectoris: Secondary | ICD-10-CM

## 2019-04-18 DIAGNOSIS — I5032 Chronic diastolic (congestive) heart failure: Secondary | ICD-10-CM | POA: Diagnosis not present

## 2019-04-18 DIAGNOSIS — M109 Gout, unspecified: Secondary | ICD-10-CM

## 2019-04-18 NOTE — Progress Notes (Signed)
Subjective:    Patient ID: [name], female    DOB: [birth year], 85 y.o.   MRN: [id number]  DOS:  04/18/2019 Type of visit - description: Attempted  to make this a video visit, due to technical difficulties from the patient side it was not possible  thus we proceeded with a Virtual Visit via Telephone    I connected with@   by telephone and verified that I am speaking with the correct person using two identifiers.  THIS ENCOUNTER IS A VIRTUAL VISIT DUE TO COVID-19 - PATIENT WAS NOT SEEN IN THE OFFICE. PATIENT HAS CONSENTED TO VIRTUAL VISIT / TELEMEDICINE VISIT   Location of patient: home  Location of provider: office  I discussed the limitations, risks, security and privacy concerns of performing an evaluation and management service by telephone and the availability of in person appointments. I also discussed with the patient that there may be a patient responsible charge related to this service. The patient expressed understanding and agreed to proceed.   History of Present Illness: Acute Her main concern continues to be hand pain. Is worse on the right, at this time she describes the symptom as a funny feeling at the wrist and very intense pain in the hand and fingers described as a burning sensation. Went to rheumatology 04/11/2019, they ordered a nerve conduction study, they apparently did not think symptoms were related to gout. She request a referral to a Copy.  Also, was seen by cardiology 03/14/2019, medications were adjusted.  She does not feel the swelling has improved.   Review of Systems  Denies fever chills Denies any redness or rash in the hands.  Past Medical History:  Diagnosis Date  . AV block, 1st degree   . CAD (coronary artery disease)    Non ST elevation 2005 ; LHC in 6/05 in setting of NSTEMI: ant apical AK and inf-apical AK, EF 29%, dOM2 70-80% (small); findings c/w Tako-Tsubo CM  . Cardiomyopathy, nonischemic ([place]) 04/2008   Likely  Tako-Tsubo. Resolved. --dx'd on cath 2005. EF 29% with minimal distal CAD (small OM1 70-80).  Echo 10/09 EF 55%;  echo 01/26/12: EF 55%, mild LAE, PASP 34.  Marland Kitchen CHB (complete heart block) (HCC)    Intermittent, s/p Saint Jude PPM  . Colitis, ischemic ([place])   . Complication of anesthesia    post anesthesia excessive somnulence  . Compression fracture of C-spine ([place]) 10/10/2011   Fell at home, tx at [facility]  . Diarrhea associated with pseudomembranous colitis 6/11-17/2013   [facility]  . Dyslipidemia   . Fall    with non-healing rib fractures  . Glaucoma   . Granuloma annulare   . History of phlebitis   . Hypertension    SEVERE  . Idiopathic thrombocytopenic purpura (ITP) (HCC)   . Lower extremity edema   . Lumbar stenosis 2004   DR. MARK ROY  . Multiple pelvic fractures 05/2012   Dr Adaline Sill , [facility]  . Osteoporosis     Past Surgical History:  Procedure Laterality Date  . ABDOMINAL HYSTERECTOMY    . APPENDECTOMY    . BACK SURGERY  10/2004   LS Disc 4-5 no sx.  Marland Kitchen CARDIAC CATHETERIZATION     12/2003  . CATARACT EXTRACTION, BILATERAL    . COLONOSCOPY  09/2005   adenoma  . COLONOSCOPY W/ POLYPECTOMY    . 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 PACEMAKER IMPLANT N/A 11/06/2016   Procedure: St Jude Pacemaker Implant;  Surgeon: Evans Lance, MD;  Location: [facility];  Service: Cardiovascular;  Laterality: N/A;  . rectal bleed  05-01-06   colonoscopy-ischemic colitis  . ROTATOR CUFF REPAIR    . VERTEBROPLASTY     Dr Gladstone Lighter    Social History   Socioeconomic History  . Marital status: Widowed    Spouse name: Not on file  . Number of children: 1  . Years of education: Not on file  . Highest education level: Not on file  Occupational History  . Occupation: retired    Fish farm manager: RETIRED  Social Needs  . Financial resource strain: Not on file  . Food insecurity    Worry: Not on file    Inability: Not on file  . Transportation needs    Medical: Not on file     Non-medical: Not on file  Tobacco Use  . Smoking status: Never Smoker  . Smokeless tobacco: Never Used  Substance and Sexual Activity  . Alcohol use: No  . Drug use: No  . Sexual activity: Not Currently  Lifestyle  . Physical activity    Days per week: Not on file    Minutes per session: Not on file  . Stress: Not on file  Relationships  . Social Herbalist on phone: Not on file    Gets together: Not on file    Attends religious service: Not on file    Active member of club or organization: Not on file    Attends meetings of clubs or organizations: Not on file    Relationship status: Not on file  . Intimate partner violence    Fear of current or ex partner: Not on file    Emotionally abused: Not on file    Physically abused: Not on file    Forced sexual activity: Not on file  Other Topics Concern  . Not on file  Social History Narrative   Widowed   Lives by herself in a town house, drives    Son Leydi Winstead       Allergies as of 04/18/2019      Reactions   Morphine And Related Anaphylaxis, Nausea Only   Per family, made her violently ill   Oxycodone Other (See Comments)   Dizziness    Penicillins Swelling, Other (See Comments)   Joint edema Has patient had a PCN reaction causing immediate rash, facial/tongue/throat swelling, SOB or lightheadedness with hypotension: Yes Has patient had a PCN reaction causing severe rash involving mucus membranes or skin necrosis: No Has patient had a PCN reaction that required hospitalization: No Has patient had a PCN reaction occurring within the last 10 years: No If all of the above answers are "NO", then may proceed with Cephalosporin use.   Clindamycin/lincomycin Hives, Itching   Colchicine Other (See Comments)   Hair loss   Norvasc [amlodipine Besylate] Other (See Comments)   Reaction not recalled by the patient ??   Ofloxacin Other (See Comments)   Unknown   Streptomycin Other (See Comments)   "was a long time  ago" reaction not recalled   Tape Other (See Comments)   SKIN IS VERY THIN AND WILL TEAR AND BRUISE EASILY!!   Tramadol Itching   Alendronate Sodium Palpitations   Amiodarone Rash, Other (See Comments)   Possible rash and nervousness per patient   Plaquenil [hydroxychloroquine Sulfate] Rash      Medication List       Accurate as of April 18, 2019  3:39 PM. If you have any questions, ask your nurse or doctor.        allopurinol 100 MG tablet Commonly known as: ZYLOPRIM Take 1 tablet (100 mg total) by mouth daily.   beta carotene w/minerals tablet Take 1 tablet by mouth daily.   brimonidine 0.2 % ophthalmic solution Commonly known as: ALPHAGAN Place 1 drop into the right eye 2 (two) times daily.   CALCIUM 1200 PO Take 1,200 mg by mouth daily with breakfast.   carvedilol 12.5 MG tablet Commonly known as: COREG TAKE 1 1/2 TABLET TWICE DAILY (18.75 MG TOTAL)   diphenhydrAMINE 25 MG tablet Commonly known as: BENADRYL Take 25 mg by mouth at bedtime as needed for sleep (and sinus issues).   docusate sodium 100 MG capsule Commonly known as: COLACE Take 100 mg by mouth daily as needed for mild constipation. Stool softener   metolazone 2.5 MG tablet Commonly known as: ZAROXOLYN TAKE 1 TABLET BY MOUTH AS DIRECTED BY THE HEART FAILURE CLINIC   omeprazole 20 MG capsule Commonly known as: PRILOSEC Take 20 mg by mouth daily.   Osteo Bi-Flex Adv Joint Shield Tabs Take 1 tablet by mouth daily.   potassium chloride SA 20 MEQ tablet Commonly known as: KLOR-CON Take 1 tablet (20 mEq total) by mouth as directed. By the heart failure clinic.   Refresh Tears 0.5 % Soln Generic drug: carboxymethylcellulose Place 1 drop into both eyes 2 (two) times daily.   timolol 0.5 % ophthalmic solution Commonly known as: TIMOPTIC Place 1 drop into the right eye 2 (two) times daily.   torsemide 20 MG tablet Commonly known as: Demadex Take 2 tablets (40 mg total) by mouth 2 (two) times  daily.   vitamin B-12 1000 MCG tablet Commonly known as: CYANOCOBALAMIN Take 1,000 mcg by mouth 2 (two) times daily.   Vitamin D3 75 MCG (3000 UT) Tabs Take 3,000 Units by mouth daily.   warfarin 3 MG tablet Commonly known as: Coumadin Take as directed by the anticoagulation clinic. If you are unsure how to take this medication, talk to your nurse or doctor. Original instructions: Take 1 tablet (3 mg total) by mouth daily.           Objective:   Physical Exam There were no vitals taken for this visit. This is a virtual phone visit.  The patient is alert oriented x3, seems in no distress    Assessment    Assessment   HTN severe CKD: creat ~1.4, 1.5 Dyslipidemia (not checking labs, patient refused medications) Hematology: --ITP-- hematology visit for 11-05-14, released to the care of PCP , check CBCs every 4 months, consult hematology if platelets <50 K --Pernicious anemia --Anemia of chronic disease CV: --CAD --Tako Tsubo cardiomyopathy --Diastolic  CHF- Dr Missy Sabins --AV block first-degree ; Paroxysmal Afib with RVR/Tachybrady syndrome s/p admission: implanted aSt Jude PPM 11/06/16 DJD: --Compression fractures --Lumbar stenosis Dr. Carloyn Manner --Multiple pelvic fractures , cervical spine compression  fracture 2013 [facility] --H/o nonhealing rib  --Osteoporosis: Dr. Agapito Games; s/p forteo, s/p  Prolia 09-2015 (declined to restart 11/2018). Last DEXA 09-29-16 OPHT: --Glaucoma  --S/p cornea transplant -- mac degeneration h/o granuloma annulare   H/o pseudomembranous colitis 2013  PLAN: Gout?  Continue with pain at the hands, R>L, saw rheumatology, they felt it was more than neuropathic pain and ordered a nerve conduction study. Currently she is not taking Uloric (cost) or allopurinol. She has ongoing pain, request a referral to a orthopedic hand specialist which I believe is  appropriate.  Will refer her. CHF: Saw cardiology 03/14/2019, Lasix discontinued, was prescribed torsemide and  continue metolazone.  Patient does not know how much or if she is taking metolazone.  Does not feel that her weight has decreased much. We will get her to the office in the next few days, we need to weight her and labs.   I discussed the assessment and treatment plan with the patient. The patient was provided an opportunity to ask questions and all were answered. The patient agreed with the plan and demonstrated an understanding of the instructions.   The patient was advised to call back or seek an in-person evaluation if the symptoms worsen or if the condition fails to improve as anticipated.  I provided 15 minutes of non-face-to-face time during this encounter.  Kathlene November, MD

## 2019-04-19 ENCOUNTER — Encounter: Payer: Self-pay | Admitting: Internal Medicine

## 2019-04-19 ENCOUNTER — Ambulatory Visit (INDEPENDENT_AMBULATORY_CARE_PROVIDER_SITE_OTHER): Payer: Medicare Other | Admitting: Internal Medicine

## 2019-04-19 ENCOUNTER — Other Ambulatory Visit: Payer: Self-pay

## 2019-04-19 VITALS — BP 139/73 | HR 74 | Temp 97.4°F | Resp 16 | Ht 64.0 in | Wt 173.2 lb

## 2019-04-19 DIAGNOSIS — I5033 Acute on chronic diastolic (congestive) heart failure: Secondary | ICD-10-CM | POA: Diagnosis not present

## 2019-04-19 DIAGNOSIS — Z23 Encounter for immunization: Secondary | ICD-10-CM | POA: Diagnosis not present

## 2019-04-19 DIAGNOSIS — I2510 Atherosclerotic heart disease of native coronary artery without angina pectoris: Secondary | ICD-10-CM | POA: Diagnosis not present

## 2019-04-19 DIAGNOSIS — M109 Gout, unspecified: Secondary | ICD-10-CM

## 2019-04-19 DIAGNOSIS — I10 Essential (primary) hypertension: Secondary | ICD-10-CM | POA: Diagnosis not present

## 2019-04-19 NOTE — Assessment & Plan Note (Signed)
Gout?  Continue with pain at the hands, R>L, saw rheumatology, they felt it was more than neuropathic pain and ordered a nerve conduction study. Currently she is not taking Uloric (cost) or allopurinol. She has ongoing pain, request a referral to a orthopedic hand specialist which I believe is appropriate.  Will refer her. CHF: Saw cardiology 03/14/2019, Lasix discontinued, was prescribed torsemide and continue metolazone.  Patient does not know how much or if she is taking metolazone.  Does not feel that her weight has decreased much. We will get her to the office in the next few days, we need to weight her and labs.

## 2019-04-19 NOTE — Progress Notes (Signed)
Subjective:    Patient ID: [name], female    DOB: [birth year], 85 y.o.   MRN: [id number]  DOS:  04/19/2019 Type of visit - description: Acute, here with her son Alvester Chou CHF: She is taking torsemide but not metolazone.  Weight at home is around 160-1 63.  Also, Alvester Chou who is here is concerned about her memory.  She is forgetful and sometimes misinterpret conversations.  No confusion per se.  Wt Readings from Last 3 Encounters:  04/19/19 173 lb 4 oz (78.6 kg)  03/14/19 170 lb (77.1 kg)  03/01/19 169 lb 4 oz (76.8 kg)     Review of Systems No chest pain, no difficulty breathing DOE?  Hard to assess, she does not do much walking. Orthopnea?  She got a recliner 4 weeks ago and sleeps there with her head elevated.  Prior to that she was using only one pillow.   Past Medical History:  Diagnosis Date  . AV block, 1st degree   . CAD (coronary artery disease)    Non ST elevation 2005 ; LHC in 6/05 in setting of NSTEMI: ant apical AK and inf-apical AK, EF 29%, dOM2 70-80% (small); findings c/w Tako-Tsubo CM  . Cardiomyopathy, nonischemic ([place]) 04/2008   Likely Tako-Tsubo. Resolved. --dx'd on cath 2005. EF 29% with minimal distal CAD (small OM1 70-80).  Echo 10/09 EF 55%;  echo 01/26/12: EF 55%, mild LAE, PASP 34.  Marland Kitchen CHB (complete heart block) (HCC)    Intermittent, s/p Saint Jude PPM  . Colitis, ischemic ([place])   . Complication of anesthesia    post anesthesia excessive somnulence  . Compression fracture of C-spine ([place]) 10/10/2011   Fell at home, tx at [facility]  . Diarrhea associated with pseudomembranous colitis 6/11-17/2013   [facility]  . Dyslipidemia   . Fall    with non-healing rib fractures  . Glaucoma   . Granuloma annulare   . History of phlebitis   . Hypertension    SEVERE  . Idiopathic thrombocytopenic purpura (ITP) (HCC)   . Lower extremity edema   . Lumbar stenosis 2004   DR. MARK ROY  . Multiple pelvic fractures ([place]) 05/2012   Dr Adaline Sill , [facility]  .  Osteoporosis     Past Surgical History:  Procedure Laterality Date  . ABDOMINAL HYSTERECTOMY    . APPENDECTOMY    . BACK SURGERY  10/2004   LS Disc 4-5 no sx.  Marland Kitchen CARDIAC CATHETERIZATION     12/2003  . CATARACT EXTRACTION, BILATERAL    . COLONOSCOPY  09/2005   adenoma  . COLONOSCOPY W/ POLYPECTOMY    . 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 PACEMAKER IMPLANT N/A 11/06/2016   Procedure: St Jude Pacemaker Implant;  Surgeon: Evans Lance, MD;  Location: [place] [facility];  Service: Cardiovascular;  Laterality: N/A;  . rectal bleed  05-01-06   colonoscopy-ischemic colitis  . ROTATOR CUFF REPAIR    . VERTEBROPLASTY     Dr Gladstone Lighter    Social History   Socioeconomic History  . Marital status: Widowed    Spouse name: Not on file  . Number of children: 1  . Years of education: Not on file  . Highest education level: Not on file  Occupational History  . Occupation: retired    Fish farm manager: RETIRED  Social Needs  . Financial resource strain: Not on file  . Food insecurity    Worry: Not on file    Inability: Not on  file  . Transportation needs    Medical: Not on file    Non-medical: Not on file  Tobacco Use  . Smoking status: Never Smoker  . Smokeless tobacco: Never Used  Substance and Sexual Activity  . Alcohol use: No  . Drug use: No  . Sexual activity: Not Currently  Lifestyle  . Physical activity    Days per week: Not on file    Minutes per session: Not on file  . Stress: Not on file  Relationships  . Social Herbalist on phone: Not on file    Gets together: Not on file    Attends religious service: Not on file    Active member of club or organization: Not on file    Attends meetings of clubs or organizations: Not on file    Relationship status: Not on file  . Intimate partner violence    Fear of current or ex partner: Not on file    Emotionally abused: Not on file    Physically abused: Not on file    Forced sexual activity: Not on file  Other  Topics Concern  . Not on file  Social History Narrative   Widowed   Lives by herself in a town house, drives    Son Anyjah Roundtree , lives near by       Allergies as of 04/19/2019      Reactions   Morphine And Related Anaphylaxis, Nausea Only   Per family, made her violently ill   Oxycodone Other (See Comments)   Dizziness    Penicillins Swelling, Other (See Comments)   Joint edema Has patient had a PCN reaction causing immediate rash, facial/tongue/throat swelling, SOB or lightheadedness with hypotension: Yes Has patient had a PCN reaction causing severe rash involving mucus membranes or skin necrosis: No Has patient had a PCN reaction that required hospitalization: No Has patient had a PCN reaction occurring within the last 10 years: No If all of the above answers are "NO", then may proceed with Cephalosporin use.   Clindamycin/lincomycin Hives, Itching   Colchicine Other (See Comments)   Hair loss   Norvasc [amlodipine Besylate] Other (See Comments)   Reaction not recalled by the patient ??   Ofloxacin Other (See Comments)   Unknown   Streptomycin Other (See Comments)   "was a long time ago" reaction not recalled   Tape Other (See Comments)   SKIN IS VERY THIN AND WILL TEAR AND BRUISE EASILY!!   Tramadol Itching   Alendronate Sodium Palpitations   Amiodarone Rash, Other (See Comments)   Possible rash and nervousness per patient   Plaquenil [hydroxychloroquine Sulfate] Rash      Medication List       Accurate as of April 19, 2019 11:59 PM. If you have any questions, ask your nurse or doctor.        beta carotene w/minerals tablet Take 1 tablet by mouth daily.   brimonidine 0.2 % ophthalmic solution Commonly known as: ALPHAGAN Place 1 drop into the right eye 2 (two) times daily.   CALCIUM 1200 PO Take 1,200 mg by mouth daily with breakfast.   carvedilol 12.5 MG tablet Commonly known as: COREG TAKE 1 1/2 TABLET TWICE DAILY (18.75 MG TOTAL)   diphenhydrAMINE  25 MG tablet Commonly known as: BENADRYL Take 25 mg by mouth at bedtime as needed for sleep (and sinus issues).   docusate sodium 100 MG capsule Commonly known as: COLACE Take 100 mg by mouth daily  as needed for mild constipation. Stool softener   metolazone 2.5 MG tablet Commonly known as: ZAROXOLYN 1 tablet Monday Wednesday and Friday in the mornings What changed: See the new instructions. Changed by: Kathlene November, MD   omeprazole 20 MG capsule Commonly known as: PRILOSEC Take 20 mg by mouth daily.   Osteo Bi-Flex Adv Joint Shield Tabs Take 1 tablet by mouth daily.   potassium chloride SA 20 MEQ tablet Commonly known as: KLOR-CON Take 1 tablet (20 mEq total) by mouth as directed. By the heart failure clinic.   Refresh Tears 0.5 % Soln Generic drug: carboxymethylcellulose Place 1 drop into both eyes 2 (two) times daily.   timolol 0.5 % ophthalmic solution Commonly known as: TIMOPTIC Place 1 drop into the right eye 2 (two) times daily.   torsemide 20 MG tablet Commonly known as: Demadex Take 2 tablets (40 mg total) by mouth 2 (two) times daily.   vitamin B-12 1000 MCG tablet Commonly known as: CYANOCOBALAMIN Take 1,000 mcg by mouth 2 (two) times daily.   Vitamin D3 75 MCG (3000 UT) Tabs Take 3,000 Units by mouth daily.   warfarin 3 MG tablet Commonly known as: Coumadin Take as directed by the anticoagulation clinic. If you are unsure how to take this medication, talk to your nurse or doctor. Original instructions: Take 1 tablet (3 mg total) by mouth daily.           Objective:   Physical Exam BP 139/73 (BP Location: Left Arm, Patient Position: Sitting, Cuff Size: Small)   Pulse 74   Temp (!) 97.4 F (36.3 C) (Temporal)   Resp 16   Ht _0  (1.626 m)   Wt 173 lb 4 oz (78.6 kg)   SpO2 98%   BMI 29.74 kg/m  General:   Well developed, NAD, BMI noted. HEENT:  Normocephalic . Face symmetric, atraumatic + JVD at 45 degrees Lungs:  Few crackles at bases  Normal respiratory effort, no intercostal retractions, no accessory muscle use. Heart: RRR,  no murmur.  Significant pitting bilateral lower extremity edema, symmetric.   Skin: Not pale. Not jaundice.  No erythema Neurologic:  alert & oriented X3.  No formal MMSE performed but she was noted to be forgetful. Speech normal, gait: Not tested, she is in a wheelchair.  I help her transfer to the table, she did get short of breath with transferring. Psych--  Cognition and judgment appear intact.  Cooperative with normal attention span and concentration.  Behavior appropriate. No anxious or depressed appearing.      Assessment     Assessment   HTN severe CKD: creat ~1.4, 1.5 Dyslipidemia (not checking labs, patient refused medications) Hematology: --ITP-- hematology visit for 11-05-14, released to the care of PCP , check CBCs every 4 months, consult hematology if platelets <50 K --Pernicious anemia --Anemia of chronic disease CV: --CAD --Tako Tsubo cardiomyopathy --Diastolic  CHF- Dr Missy Sabins --AV block first-degree ; Paroxysmal Afib with RVR/Tachybrady syndrome s/p admission: implanted aSt Jude PPM 11/06/16 DJD: --Compression fractures --Lumbar stenosis Dr. Carloyn Manner --Multiple pelvic fractures , cervical spine compression  fracture 2013 [facility] --H/o nonhealing rib  --Osteoporosis: Dr. Agapito Games; s/p forteo, s/p  Prolia 09-2015 (declined to restart 11/2018). Last DEXA 09-29-16 OPHT: --Glaucoma  --S/p cornea transplant -- mac degeneration h/o granuloma annulare   H/o pseudomembranous colitis 2013  PLAN: CHF: The patient is volume overload, + JVD, crackles, edema. Currently on torsemide 40 mg twice a day, not taking metolazone. Plan: BMP, continue torsemide 40  mg twice a day, metolazone Monday Wednesday and Friday (this week will take 1 tomorrow), monitor weights, monitor BPs, reassess in 1 week. Gout?  Very confusing picture, has pain at the wrist but today  she reports severe pain at the  second and third right fingers.  On exam there is mild puffiness in both wrists without redness.  Encouraged to proceed with a nerve conduction study as recommended by rheumatology and see the orthopedic hand surgeon for another opinion. Currently not on allopurinol or Uloric.  Check a uric acid Flu shot today RTC 1 week

## 2019-04-19 NOTE — Progress Notes (Signed)
Pre visit review using our clinic review tool, if applicable. No additional management support is needed unless otherwise documented below in the visit note. 

## 2019-04-19 NOTE — Patient Instructions (Addendum)
GO TO THE LAB : Get the blood work     GO TO THE FRONT DESK Schedule your next appointment for a checkup next week   Continue the medications as you are and also take  Metolazone 2.5 mg: Take 1 today Wednesday, tomorrow Thursday and Friday.  Starting next week, take 1 tablet Monday Wednesday and Friday  Weight yourself every day, write it down.   Check the  blood pressure 2 or 3 times a week BP GOAL is between 110/65 and  135/85. If it is consistently higher or lower, let me know

## 2019-04-20 LAB — BASIC METABOLIC PANEL
BUN: 46 mg/dL — ABNORMAL HIGH (ref 6–23)
CO2: 23 mEq/L (ref 19–32)
Calcium: 9 mg/dL (ref 8.4–10.5)
Chloride: 103 mEq/L (ref 96–112)
Creatinine, Ser: 2.25 mg/dL — ABNORMAL HIGH (ref 0.40–1.20)
GFR: 20.23 mL/min — ABNORMAL LOW (ref >60.00)
Glucose, Bld: 95 mg/dL (ref 70–99)
Potassium: 3.3 mEq/L — ABNORMAL LOW (ref 3.5–5.1)
Sodium: 140 mEq/L (ref 135–145)

## 2019-04-20 LAB — URIC ACID: Uric Acid, Serum: 11.6 mg/dL — ABNORMAL HIGH (ref 2.4–7.0)

## 2019-04-20 NOTE — Assessment & Plan Note (Signed)
CHF: The patient is volume overload, + JVD, crackles, edema. Currently on torsemide 40 mg twice a day, not taking metolazone. Plan: BMP, continue torsemide 40 mg twice a day, metolazone Monday Wednesday and Friday (this week will take 1 tomorrow), monitor weights, monitor BPs, reassess in 1 week. Gout?  Very confusing picture, has pain at the wrist but today  she reports severe pain at the second and third right fingers.  On exam there is mild puffiness in both wrists without redness.  Encouraged to proceed with a nerve conduction study as recommended by rheumatology and see the orthopedic hand surgeon for another opinion. Currently not on allopurinol or Uloric.  Check a uric acid Flu shot today RTC 1 week

## 2019-04-22 ENCOUNTER — Other Ambulatory Visit: Payer: Self-pay | Admitting: Internal Medicine

## 2019-04-25 ENCOUNTER — Ambulatory Visit (INDEPENDENT_AMBULATORY_CARE_PROVIDER_SITE_OTHER): Payer: Medicare Other | Admitting: Internal Medicine

## 2019-04-25 ENCOUNTER — Encounter: Payer: Self-pay | Admitting: Internal Medicine

## 2019-04-25 ENCOUNTER — Other Ambulatory Visit: Payer: Self-pay

## 2019-04-25 VITALS — BP 110/51 | HR 81 | Temp 97.5°F | Resp 16 | Ht 64.0 in | Wt 171.2 lb

## 2019-04-25 DIAGNOSIS — I2510 Atherosclerotic heart disease of native coronary artery without angina pectoris: Secondary | ICD-10-CM

## 2019-04-25 DIAGNOSIS — I5033 Acute on chronic diastolic (congestive) heart failure: Secondary | ICD-10-CM

## 2019-04-25 NOTE — Patient Instructions (Addendum)
GO TO THE LAB : Get the blood work     GO TO THE FRONT DESK Schedule your next appointment      Please keep the appointments you have with cardiology on 04/27/2019 and 05/05/2019  Continue keeping a record of your weight at home  Continue the same medications except metolazone: 1 tablet Monday, Wednesday, Thursday and Friday in the mornings

## 2019-04-25 NOTE — Progress Notes (Signed)
Subjective:    Patient ID: [name], female    DOB: [birth year], 85 y.o.   MRN: [id number]  DOS:  04/25/2019 Type of visit - description: Follow-up, here with her son Alvester Chou. Good compliance with medications for CHF, she has noted that the lower extremity edema is decreased particularly at the ankles. Weight at home: 173, 170, 167, 165, 168, 165, 163  Wt Readings from Last 3 Encounters:  04/25/19 171 lb 4 oz (77.7 kg)  04/19/19 173 lb 4 oz (78.6 kg)  03/14/19 170 lb (77.1 kg)    Review of Systems Denies fever chills No chest pain or difficulty breathing at rest. No palpitations No lower extremity cramps  Past Medical History:  Diagnosis Date  . AV block, 1st degree   . CAD (coronary artery disease)    Non ST elevation 2005 ; LHC in 6/05 in setting of NSTEMI: ant apical AK and inf-apical AK, EF 29%, dOM2 70-80% (small); findings c/w Tako-Tsubo CM  . Cardiomyopathy, nonischemic ([place]) 04/2008   Likely Tako-Tsubo. Resolved. --dx'd on cath 2005. EF 29% with minimal distal CAD (small OM1 70-80).  Echo 10/09 EF 55%;  echo 01/26/12: EF 55%, mild LAE, PASP 34.  Marland Kitchen CHB (complete heart block) (HCC)    Intermittent, s/p Saint Jude PPM  . Colitis, ischemic ([place])   . Complication of anesthesia    post anesthesia excessive somnulence  . Compression fracture of C-spine ([place]) 10/10/2011   Fell at home, tx at [facility]  . Diarrhea associated with pseudomembranous colitis 6/11-17/2013   [facility]  . Dyslipidemia   . Fall    with non-healing rib fractures  . Glaucoma   . Granuloma annulare   . History of phlebitis   . Hypertension    SEVERE  . Idiopathic thrombocytopenic purpura (ITP) (HCC)   . Lower extremity edema   . Lumbar stenosis 2004   DR. MARK ROY  . Multiple pelvic fractures ([place]) 05/2012   Dr Adaline Sill , [facility]  . Osteoporosis     Past Surgical History:  Procedure Laterality Date  . ABDOMINAL HYSTERECTOMY    . APPENDECTOMY    . BACK SURGERY  10/2004   LS Disc  4-5 no sx.  Marland Kitchen CARDIAC CATHETERIZATION     12/2003  . CATARACT EXTRACTION, BILATERAL    . COLONOSCOPY  09/2005   adenoma  . COLONOSCOPY W/ POLYPECTOMY    . 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 PACEMAKER IMPLANT N/A 11/06/2016   Procedure: St Jude Pacemaker Implant;  Surgeon: Evans Lance, MD;  Location: [place] [facility];  Service: Cardiovascular;  Laterality: N/A;  . rectal bleed  05-01-06   colonoscopy-ischemic colitis  . ROTATOR CUFF REPAIR    . VERTEBROPLASTY     Dr Gladstone Lighter    Social History   Socioeconomic History  . Marital status: Widowed    Spouse name: Not on file  . Number of children: 1  . Years of education: Not on file  . Highest education level: Not on file  Occupational History  . Occupation: retired    Fish farm manager: RETIRED  Social Needs  . Financial resource strain: Not on file  . Food insecurity    Worry: Not on file    Inability: Not on file  . Transportation needs    Medical: Not on file    Non-medical: Not on file  Tobacco Use  . Smoking status: Never Smoker  . Smokeless tobacco: Never Used  Substance and Sexual  Activity  . Alcohol use: No  . Drug use: No  . Sexual activity: Not Currently  Lifestyle  . Physical activity    Days per week: Not on file    Minutes per session: Not on file  . Stress: Not on file  Relationships  . Social Herbalist on phone: Not on file    Gets together: Not on file    Attends religious service: Not on file    Active member of club or organization: Not on file    Attends meetings of clubs or organizations: Not on file    Relationship status: Not on file  . Intimate partner violence    Fear of current or ex partner: Not on file    Emotionally abused: Not on file    Physically abused: Not on file    Forced sexual activity: Not on file  Other Topics Concern  . Not on file  Social History Narrative   Widowed   Lives by herself in a town house, drives    Son Minaal Struckman , lives near by        Allergies as of 04/25/2019      Reactions   Morphine And Related Anaphylaxis, Nausea Only   Per family, made her violently ill   Oxycodone Other (See Comments)   Dizziness    Penicillins Swelling, Other (See Comments)   Joint edema Has patient had a PCN reaction causing immediate rash, facial/tongue/throat swelling, SOB or lightheadedness with hypotension: Yes Has patient had a PCN reaction causing severe rash involving mucus membranes or skin necrosis: No Has patient had a PCN reaction that required hospitalization: No Has patient had a PCN reaction occurring within the last 10 years: No If all of the above answers are "NO", then may proceed with Cephalosporin use.   Clindamycin/lincomycin Hives, Itching   Colchicine Other (See Comments)   Hair loss   Norvasc [amlodipine Besylate] Other (See Comments)   Reaction not recalled by the patient ??   Ofloxacin Other (See Comments)   Unknown   Streptomycin Other (See Comments)   "was a long time ago" reaction not recalled   Tape Other (See Comments)   SKIN IS VERY THIN AND WILL TEAR AND BRUISE EASILY!!   Tramadol Itching   Alendronate Sodium Palpitations   Amiodarone Rash, Other (See Comments)   Possible rash and nervousness per patient   Plaquenil [hydroxychloroquine Sulfate] Rash      Medication List       Accurate as of April 25, 2019  3:06 PM. If you have any questions, ask your nurse or doctor.        beta carotene w/minerals tablet Take 1 tablet by mouth daily.   brimonidine 0.2 % ophthalmic solution Commonly known as: ALPHAGAN Place 1 drop into the right eye 2 (two) times daily.   CALCIUM 1200 PO Take 1,200 mg by mouth daily with breakfast.   carvedilol 12.5 MG tablet Commonly known as: COREG TAKE 1 1/2 TABLET TWICE DAILY (18.75 MG TOTAL)   diphenhydrAMINE 25 MG tablet Commonly known as: BENADRYL Take 25 mg by mouth at bedtime as needed for sleep (and sinus issues).   docusate sodium 100 MG capsule  Commonly known as: COLACE Take 100 mg by mouth daily as needed for mild constipation. Stool softener   metolazone 2.5 MG tablet Commonly known as: ZAROXOLYN 1 tablet Monday Wednesday and Friday in the mornings   omeprazole 20 MG capsule Commonly known as: PRILOSEC Take  20 mg by mouth daily.   Osteo Bi-Flex Adv Joint Shield Tabs Take 1 tablet by mouth daily.   potassium chloride SA 20 MEQ tablet Commonly known as: KLOR-CON Take 2 tablets (40 mEq total) by mouth daily.   Refresh Tears 0.5 % Soln Generic drug: carboxymethylcellulose Place 1 drop into both eyes 2 (two) times daily.   timolol 0.5 % ophthalmic solution Commonly known as: TIMOPTIC Place 1 drop into the right eye 2 (two) times daily.   torsemide 20 MG tablet Commonly known as: Demadex Take 2 tablets (40 mg total) by mouth 2 (two) times daily.   vitamin B-12 1000 MCG tablet Commonly known as: CYANOCOBALAMIN Take 1,000 mcg by mouth 2 (two) times daily.   Vitamin D3 75 MCG (3000 UT) Tabs Take 3,000 Units by mouth daily.   warfarin 3 MG tablet Commonly known as: Coumadin Take as directed by the anticoagulation clinic. If you are unsure how to take this medication, talk to your nurse or doctor. Original instructions: Take 1 tablet (3 mg total) by mouth daily.           Objective:   Physical Exam BP (!) 110/51 (BP Location: Left Arm, Patient Position: Sitting, Cuff Size: Normal)   Pulse 81   Temp (!) 97.5 F (36.4 C) (Temporal)   Resp 16   Ht _0  (1.626 m)   Wt 171 lb 4 oz (77.7 kg)   SpO2 96%   BMI 29.39 kg/m  General:   Well developed, NAD, BMI noted. HEENT:  Normocephalic . Face symmetric, atraumatic Lungs:  + Crackles at bases Normal respiratory effort, no intercostal retractions, no accessory muscle use. Heart: RRR,  no murmur.  Edema around the ankles decreased, still edema at the calves Skin: Not pale. Not jaundice Neurologic:  alert & oriented X3.  Speech normal, gait not tested  Psych--  Cognition and judgment appear intact.  Cooperative with normal attention span and concentration.  Behavior appropriate. No anxious or depressed appearing.      Assessment     Assessment   HTN severe CKD: creat ~1.4, 1.5 Dyslipidemia (not checking labs, patient refused medications) Hematology: --ITP-- hematology visit for 11-05-14, released to the care of PCP , check CBCs every 4 months, consult hematology if platelets <50 K --Pernicious anemia --Anemia of chronic disease CV: --CAD --Tako Tsubo cardiomyopathy --Diastolic  CHF- Dr Missy Sabins --AV block first-degree ; Paroxysmal Afib with RVR/Tachybrady syndrome s/p admission: implanted aSt Jude PPM 11/06/16 DJD: --Compression fractures --Lumbar stenosis Dr. Carloyn Manner --Multiple pelvic fractures , cervical spine compression  fracture 2013 [facility] --H/o nonhealing rib  --Osteoporosis: Dr. Agapito Games; s/p forteo, s/p  Prolia 09-2015 (declined to restart 11/2018). Last DEXA 09-29-16 OPHT: --Glaucoma  --S/p cornea transplant -- mac degeneration h/o granuloma annulare   H/o pseudomembranous colitis 2013  PLAN: CHF: For her own scales she has lost ~ 10 pounds, per our scales 2 pounds.  Good compliance with torsemide 40 mg 2 tablets twice a day and Zaroxolyn Monday Wednesday and Friday.  Also takes potassium, BP today is satisfactory. Still fluid overloaded, + crackles at bases, periankle edema decreased.  Making some progress. Plan: Check a BMP, magnesium Increase torsemide to Monday, Wednesday, Thursday and Friday. Continue checking her weight at home. Has an appointment with cardiology soon. Gout?  Has not seen Ortho just yet. RTC 6 weeks

## 2019-04-25 NOTE — Progress Notes (Signed)
Pre visit review using our clinic review tool, if applicable. No additional management support is needed unless otherwise documented below in the visit note. 

## 2019-04-26 LAB — BASIC METABOLIC PANEL
BUN: 59 mg/dL — ABNORMAL HIGH (ref 6–23)
CO2: 25 mEq/L (ref 19–32)
Calcium: 8.4 mg/dL (ref 8.4–10.5)
Chloride: 102 mEq/L (ref 96–112)
Creatinine, Ser: 2.2 mg/dL — ABNORMAL HIGH (ref 0.40–1.20)
GFR: 20.76 mL/min — ABNORMAL LOW (ref >60.00)
Glucose, Bld: 138 mg/dL — ABNORMAL HIGH (ref 70–99)
Potassium: 3.5 mEq/L (ref 3.5–5.1)
Sodium: 140 mEq/L (ref 135–145)

## 2019-04-26 LAB — MAGNESIUM: Magnesium: 1.4 mg/dL — ABNORMAL LOW (ref 1.5–2.5)

## 2019-04-26 NOTE — Assessment & Plan Note (Signed)
CHF: For her own scales she has lost ~ 10 pounds, per our scales 2 pounds.  Good compliance with torsemide 40 mg 2 tablets twice a day and Zaroxolyn Monday Wednesday and Friday.  Also takes potassium, BP today is satisfactory. Still fluid overloaded, + crackles at bases, periankle edema decreased.  Making some progress. Plan: Check a BMP, magnesium Increase torsemide to Monday, Wednesday, Thursday and Friday. Continue checking her weight at home. Has an appointment with cardiology soon. Gout?  Has not seen Ortho just yet. RTC 6 weeks

## 2019-04-27 ENCOUNTER — Ambulatory Visit (INDEPENDENT_AMBULATORY_CARE_PROVIDER_SITE_OTHER): Payer: Medicare Other

## 2019-04-27 ENCOUNTER — Other Ambulatory Visit: Payer: Self-pay

## 2019-04-27 DIAGNOSIS — Z5181 Encounter for therapeutic drug level monitoring: Secondary | ICD-10-CM | POA: Diagnosis not present

## 2019-04-27 DIAGNOSIS — I4891 Unspecified atrial fibrillation: Secondary | ICD-10-CM

## 2019-04-27 LAB — POCT INR: INR: 2.9 (ref 2.0–3.0)

## 2019-04-27 NOTE — Patient Instructions (Signed)
Description   Continue on same dosage 1/2 tablet daily except for 1 tablet on Mondays, Wednesdays and Fridays.  Recheck INR in 3 weeks. Call the coumadin Clinic if for any changes in medication or upcoming procedures. Coumadin Clinic [phone] Main [phone]

## 2019-04-28 ENCOUNTER — Telehealth (HOSPITAL_COMMUNITY): Payer: Self-pay | Admitting: *Deleted

## 2019-04-28 NOTE — Telephone Encounter (Signed)
-----   Message from Jolaine Artist, MD sent at 04/27/2019  6:19 PM EDT ----- Regarding: RE: CHF [place],  [place]. Sure. We will get her in soon.   Nira Conn can you arrange a visit?  Thanks. - dan ----- Message ----- From: Colon Branch, MD Sent: 04/27/2019   4:33 PM EDT To: Jolaine Artist, MD Subject: CHF                                            Dan, I have been increasing her diuretics, she is improving some but she is still volume overload.  You are going to see her soon, if you have any suggestions please let me know. THX JP

## 2019-04-28 NOTE — Telephone Encounter (Signed)
Called and spoke w/pt, she states she is taking the Torsemide 40 mg BID and Metolazone on Mon, Wed, Thur and Fri as PCP prescribed.  However she states it does not seem to have helped her edema at all and her LE and feet are "pretty swollen" per pt.  She is sch for f/u w/Dr Bensimhon on Fri 10/23, scheduled her an appt for Mon 10/19 at 2 pm, she will confirm with her son that he can bring her and if not she will call us back.

## 2019-05-01 ENCOUNTER — Encounter (HOSPITAL_COMMUNITY): Payer: Self-pay

## 2019-05-01 ENCOUNTER — Ambulatory Visit (HOSPITAL_COMMUNITY)
Admission: RE | Admit: 2019-05-01 | Discharge: 2019-05-01 | Disposition: A | Discharge status: Home / Self Care | Payer: Medicare Other | Source: Ambulatory Visit | Attending: Internal Medicine | Admitting: Internal Medicine

## 2019-05-01 ENCOUNTER — Other Ambulatory Visit: Payer: Self-pay

## 2019-05-01 VITALS — BP 142/90 | HR 80 | Wt 171.0 lb

## 2019-05-01 DIAGNOSIS — H409 Unspecified glaucoma: Secondary | ICD-10-CM | POA: Diagnosis not present

## 2019-05-01 DIAGNOSIS — Z888 Allergy status to other drugs, medicaments and biological substances status: Secondary | ICD-10-CM | POA: Diagnosis not present

## 2019-05-01 DIAGNOSIS — E785 Hyperlipidemia, unspecified: Secondary | ICD-10-CM | POA: Insufficient documentation

## 2019-05-01 DIAGNOSIS — Z950 Presence of cardiac pacemaker: Secondary | ICD-10-CM | POA: Diagnosis not present

## 2019-05-01 DIAGNOSIS — I2510 Atherosclerotic heart disease of native coronary artery without angina pectoris: Secondary | ICD-10-CM | POA: Diagnosis not present

## 2019-05-01 DIAGNOSIS — Z79899 Other long term (current) drug therapy: Secondary | ICD-10-CM | POA: Diagnosis not present

## 2019-05-01 DIAGNOSIS — I495 Sick sinus syndrome: Secondary | ICD-10-CM | POA: Insufficient documentation

## 2019-05-01 DIAGNOSIS — N183 Chronic kidney disease, stage 3 unspecified: Secondary | ICD-10-CM | POA: Diagnosis not present

## 2019-05-01 DIAGNOSIS — I5032 Chronic diastolic (congestive) heart failure: Secondary | ICD-10-CM

## 2019-05-01 DIAGNOSIS — Z885 Allergy status to narcotic agent status: Secondary | ICD-10-CM | POA: Diagnosis not present

## 2019-05-01 DIAGNOSIS — Z7901 Long term (current) use of anticoagulants: Secondary | ICD-10-CM | POA: Diagnosis not present

## 2019-05-01 DIAGNOSIS — I4891 Unspecified atrial fibrillation: Secondary | ICD-10-CM

## 2019-05-01 DIAGNOSIS — I110 Hypertensive heart disease with heart failure: Secondary | ICD-10-CM | POA: Diagnosis not present

## 2019-05-01 DIAGNOSIS — Z881 Allergy status to other antibiotic agents status: Secondary | ICD-10-CM | POA: Insufficient documentation

## 2019-05-01 DIAGNOSIS — Z8249 Family history of ischemic heart disease and other diseases of the circulatory system: Secondary | ICD-10-CM | POA: Insufficient documentation

## 2019-05-01 DIAGNOSIS — I10 Essential (primary) hypertension: Secondary | ICD-10-CM | POA: Diagnosis not present

## 2019-05-01 DIAGNOSIS — Z880 Allergy status to penicillin: Secondary | ICD-10-CM | POA: Diagnosis not present

## 2019-05-01 DIAGNOSIS — I252 Old myocardial infarction: Secondary | ICD-10-CM | POA: Diagnosis not present

## 2019-05-01 DIAGNOSIS — D693 Immune thrombocytopenic purpura: Secondary | ICD-10-CM | POA: Diagnosis not present

## 2019-05-01 DIAGNOSIS — I4821 Permanent atrial fibrillation: Secondary | ICD-10-CM | POA: Insufficient documentation

## 2019-05-01 LAB — BASIC METABOLIC PANEL
Anion gap: 10 (ref 5–15)
BUN: 42 mg/dL — ABNORMAL HIGH (ref 8–23)
CO2: 24 mmol/L (ref 22–32)
Calcium: 9.1 mg/dL (ref 8.9–10.3)
Chloride: 102 mmol/L (ref 98–111)
Creatinine, Ser: 1.79 mg/dL — ABNORMAL HIGH (ref 0.44–1.00)
GFR calc Af Amer: 28 mL/min — ABNORMAL LOW (ref >60)
GFR calc non Af Amer: 24 mL/min — ABNORMAL LOW (ref >60)
Glucose, Bld: 110 mg/dL — ABNORMAL HIGH (ref 70–99)
Potassium: 3.9 mmol/L (ref 3.5–5.1)
Sodium: 136 mmol/L (ref 135–145)

## 2019-05-01 LAB — BRAIN NATRIURETIC PEPTIDE: B Natriuretic Peptide: 357.6 pg/mL — ABNORMAL HIGH (ref 0.0–100.0)

## 2019-05-01 MED ORDER — TORSEMIDE 20 MG PO TABS: ORAL_TABLET | ORAL | 3 refills | Status: DC

## 2019-05-01 NOTE — Patient Instructions (Signed)
INCREASE Torsemide to 80 mg in the AM and 40 mg in the PM  Labs today We will only contact you if something comes back abnormal or we need to make some changes. Otherwise no news is good news!  Your physician recommends that you keep your follow up as scheduled in two weeks.  Do the following things EVERYDAY: 1) Weigh yourself in the morning before breakfast. Write it down and keep it in a log. 2) Take your medicines as prescribed 3) Eat low salt foods-Limit salt (sodium) to 2000 mg per day.  4) Stay as active as you can everyday 5) Limit all fluids for the day to less than 2 liters  At the [facility], you and your health needs are our priority. As part of our continuing mission to provide you with exceptional heart care, we have created designated Provider Care Teams. These Care Teams include your primary Cardiologist (physician) and Advanced Practice Providers (APPs- Physician Assistants and Nurse Practitioners) who all work together to provide you with the care you need, when you need it.   You may see any of the following providers on your designated Care Team at your next follow up: Marland Kitchen Dr Glori Bickers . Dr Loralie Champagne . Darrick Grinder, NP . Lyda Jester, PA   Please be sure to bring in all your medications bottles to every appointment.

## 2019-05-01 NOTE — Progress Notes (Signed)
Date:  05/01/2019   ID:  [name], DOB 11-22-1922, MRN [id number]  Location: Home  Provider location: [facility] Type of Visit: Established patient  PCP:  Colon Branch, MD  Cardiologist:  Glori Bickers, MD Primary HF: Bensimhon EP: [facility] Le [facility]  Chief Complaint: Heart Failure follow-up   History of Present Illness:  [name] is a 85 y.o. 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 LHC in 6/05 in setting of NSTEMI: ant apical AK and inf-apical AK, EF 29%, dOM2 70-80% (small). EF has recovered. Also has a h/o HTN, permanent AF with tachy-brady syndrome s/p PPM and fractured pelvis 05/2012.  Admitted 4/27-4/28/18 for evaluation of tachycardia. Had recent holter monitor that showed 6.5 second pause. Coreg stopped and referred to EP. She then developed Afib RVR. With pause, PPM was placed 11/06/16 by Dr. Lovena Le.  Noted to be in Afib with controlled ventricular rate on discharge.   Today she returns for an acute HF visit for increased leg edema with her son. Overall feeling ok other than leg edema. About 4 weeks ago she noticed increased leg edema. She saw he primary care doctor and was switched to torsemide 40 mg twice a day and metolazone was increased to 4 times a week. Says she has good urine output. Mild SOB with exertion. Denies PND/Orthopnea. Appetite ok. Eating 8 crackers + peanut butter daily. Drinks a lot of fluid during the day. No fever or chills. Taking all medications.  01/2012 ECHO EF 55% 08/14/13 ECHO EF 55%, normal RV size and systolic function.    [name] denies symptoms worrisome for COVID 19.   Past Medical History:  Diagnosis Date  . AV block, 1st degree   . CAD (coronary artery disease)    Non ST elevation 2005 ; LHC in 6/05 in setting of NSTEMI: ant apical AK and inf-apical AK, EF 29%, dOM2 70-80% (small); findings c/w Tako-Tsubo CM  . Cardiomyopathy, nonischemic ([place])  04/2008   Likely Tako-Tsubo. Resolved. --dx'd on cath 2005. EF 29% with minimal distal CAD (small OM1 70-80).  Echo 10/09 EF 55%;  echo 01/26/12: EF 55%, mild LAE, PASP 34.  Marland Kitchen CHB (complete heart block) (HCC)    Intermittent, s/p Saint Jude PPM  . Colitis, ischemic ([place])   . Complication of anesthesia    post anesthesia excessive somnulence  . Compression fracture of C-spine ([place]) 10/10/2011   Fell at home, [facility] at [facility]  . Diarrhea associated with pseudomembranous colitis 6/11-17/2013   [facility]  . Dyslipidemia   . Fall    with non-healing rib fractures  . Glaucoma   . Granuloma annulare   . History of phlebitis   . Hypertension    SEVERE  . Idiopathic thrombocytopenic purpura (ITP) (HCC)   . Lower extremity edema   . Lumbar stenosis 2004   DR. MARK ROY  . Multiple pelvic fractures ([place]) 05/2012   Dr Adaline Sill , [facility]  . Osteoporosis    Past Surgical History:  Procedure Laterality Date  . ABDOMINAL HYSTERECTOMY    . APPENDECTOMY    . BACK SURGERY  10/2004   LS Disc 4-5 no sx.  Marland Kitchen CARDIAC CATHETERIZATION     12/2003  . CATARACT EXTRACTION, BILATERAL    . COLONOSCOPY  09/2005   adenoma  . COLONOSCOPY W/ POLYPECTOMY    . 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 PACEMAKER IMPLANT N/A  11/06/2016   Procedure: St Jude Pacemaker Implant;  Surgeon: Evans Lance, MD;  Location: [facility];  Service: Cardiovascular;  Laterality: N/A;  . rectal bleed  05-01-06   colonoscopy-ischemic colitis  . ROTATOR CUFF REPAIR    . VERTEBROPLASTY     Dr Gladstone Lighter     Current Outpatient Medications  Medication Sig Dispense Refill  . beta carotene w/minerals (OCUVITE) tablet Take 1 tablet by mouth daily.     . brimonidine (ALPHAGAN) 0.2 % ophthalmic solution Place 1 drop into the right eye 2 (two) times daily.      . Calcium Carbonate-Vit D-Min (CALCIUM 1200 PO) Take 1,200 mg by mouth daily with breakfast.    . carboxymethylcellulose (REFRESH TEARS) 0.5 % SOLN Place 1 drop into  both eyes 2 (two) times daily.     . carvedilol (COREG) 12.5 MG tablet TAKE 1 1/2 TABLET TWICE DAILY (18.75 MG TOTAL) 270 tablet 1  . Cholecalciferol (VITAMIN D3) 3000 UNITS TABS Take 3,000 Units by mouth daily.     . diphenhydrAMINE (BENADRYL) 25 MG tablet Take 25 mg by mouth at bedtime as needed for sleep (and sinus issues).     . docusate sodium (COLACE) 100 MG capsule Take 100 mg by mouth daily as needed for mild constipation. Stool softener     . metolazone (ZAROXOLYN) 2.5 MG tablet 1 tablet Monday, Wednesday, Thursday and Friday in the mornings    . Misc Natural Products (OSTEO BI-FLEX ADV JOINT SHIELD) TABS Take 1 tablet by mouth daily.     Marland Kitchen omeprazole (PRILOSEC) 20 MG capsule Take 20 mg by mouth daily.      . potassium chloride SA (KLOR-CON) 20 MEQ tablet Take 2 tablets (40 mEq total) by mouth daily.    . timolol (TIMOPTIC) 0.5 % ophthalmic solution Place 1 drop into the right eye 2 (two) times daily.  5  . torsemide (DEMADEX) 20 MG tablet Take 2 tablets (40 mg total) by mouth 2 (two) times daily. 360 tablet 3  . vitamin B-12 (CYANOCOBALAMIN) 1000 MCG tablet Take 1,000 mcg by mouth 2 (two) times daily.     Marland Kitchen warfarin (COUMADIN) 3 MG tablet Take 1 tablet (3 mg total) by mouth daily. 30 tablet 11   No current facility-administered medications for this encounter.     Allergies:   Morphine and related, Oxycodone, Penicillins, Clindamycin/lincomycin, Colchicine, Norvasc [amlodipine besylate], Ofloxacin, Streptomycin, Tape, Tramadol, Alendronate sodium, Amiodarone, and Plaquenil [hydroxychloroquine sulfate]   Social History:  The patient  reports that she has never smoked. She has never used smokeless tobacco. She reports that she does not drink alcohol or use drugs.   Family History:  The patient's family history includes Breast cancer in her sister and sister; Colon cancer in her father; Coronary artery disease in her mother; Heart failure in her mother; Kidney disease in her mother; Rectal  cancer in her sister; Renal cancer in her mother; Stroke in her father.   ROS:  Please see the history of present illness.   All other systems are personally reviewed and negative.  Vitals:   05/01/19 1418  BP: (!) 142/90  Pulse: 80  SpO2: 90%   Wt Readings from Last 3 Encounters:  05/01/19 77.6 kg (171 lb)  04/25/19 77.7 kg (171 lb 4 oz)  04/19/19 78.6 kg (173 lb 4 oz)    Exam:  General:  Elderly. No resp difficulty. Arrived in a wheel chair.  HEENT: normal Neck: supple. JVP to jaw  Carotids 2+  bilat; no bruits. No lymphadenopathy or thryomegaly appreciated. Cor: PMI nondisplaced. Irregular rate & rhythm. No rubs, gallops or murmurs. Lungs: clear Abdomen: soft, nontender, nondistended. No hepatosplenomegaly. No bruits or masses. Good bowel sounds. Extremities: no cyanosis, clubbing, rash, R and LLE 2-3+edema Neuro: alert & orientedx3, cranial nerves grossly intact. moves all 4 extremities w/o difficulty. Affect pleasant  Recent Labs: 06/23/2018: B Natriuretic Peptide 187.7 03/01/2019: Hemoglobin 10.2; Platelets 56.0; Pro B Natriuretic peptide (BNP) 479.0 04/25/2019: BUN 59; Creatinine, Ser 2.20; Magnesium 1.4; Potassium 3.5; Sodium 140  Personally reviewed   Wt Readings from Last 3 Encounters:  05/01/19 77.6 kg (171 lb)  04/25/19 77.7 kg (171 lb 4 oz)  04/19/19 78.6 kg (173 lb 4 oz)      ASSESSMENT AND PLAN:  1. Chronic diastolic CHF: History of Takotsubo cardiomyopathy, EF has recovered. - Echo 08/14/13 EF 55-60% with normal RV.  -NYHA III. Volume status elevated suspect in the setting of fluid intake + high sodium snacks. Discussed limiting fluid intake to < 2 liters and low salt food choices.  - Increase morning torsemide to 80 mg and continue 40 mg in the evening.  - Continue metolazone 4 times a week.  - Check BMET today and on Friday.  2. CAD:  - Minimal branch vessel disease on cath - No chest pain.   - No s/s ischemia.  - Off ASA in setting of Xarelto. Has  refused statins. 3. Permanent AF with Tachybrady syndrome s/p St Jude PPM 11/06/16 Regular pulse. This patients CHA2DS2-VASc Score and unadjusted Ischemic Stroke Rate (% per year) is equal to 9.7 % stroke rate/year from a score of 6  - Followed by Dr. Lovena Le and [facility] - On coumadin. No bleeding issues.  4.  HTN  A little elevated. Hopefully will improve with diuresis.   Follow up on Friday to reassess fluid status. If no improvement will need to admit for IV diuresis. She was agreeable to the plan.    Jeanmarie Hubert, NP  05/01/2019 2:21 PM  [facility] [place] [address] [facility] [place] [address] [phone] (office) [phone] (fax)

## 2019-05-05 ENCOUNTER — Encounter (HOSPITAL_COMMUNITY): Payer: Self-pay | Admitting: Internal Medicine

## 2019-05-05 ENCOUNTER — Other Ambulatory Visit: Payer: Self-pay

## 2019-05-05 ENCOUNTER — Ambulatory Visit (HOSPITAL_COMMUNITY)
Admission: RE | Admit: 2019-05-05 | Discharge: 2019-05-05 | Disposition: A | Discharge status: Home / Self Care | Payer: Medicare Other | Source: Ambulatory Visit | Attending: Internal Medicine | Admitting: Internal Medicine

## 2019-05-05 VITALS — BP 110/60 | HR 92 | Wt 166.8 lb

## 2019-05-05 DIAGNOSIS — I4821 Permanent atrial fibrillation: Secondary | ICD-10-CM | POA: Insufficient documentation

## 2019-05-05 DIAGNOSIS — E785 Hyperlipidemia, unspecified: Secondary | ICD-10-CM | POA: Diagnosis not present

## 2019-05-05 DIAGNOSIS — Z79899 Other long term (current) drug therapy: Secondary | ICD-10-CM | POA: Insufficient documentation

## 2019-05-05 DIAGNOSIS — Z8249 Family history of ischemic heart disease and other diseases of the circulatory system: Secondary | ICD-10-CM | POA: Insufficient documentation

## 2019-05-05 DIAGNOSIS — Z800 Family history of malignant neoplasm of digestive organs: Secondary | ICD-10-CM | POA: Insufficient documentation

## 2019-05-05 DIAGNOSIS — Z823 Family history of stroke: Secondary | ICD-10-CM | POA: Diagnosis not present

## 2019-05-05 DIAGNOSIS — H409 Unspecified glaucoma: Secondary | ICD-10-CM | POA: Diagnosis not present

## 2019-05-05 DIAGNOSIS — Z885 Allergy status to narcotic agent status: Secondary | ICD-10-CM | POA: Diagnosis not present

## 2019-05-05 DIAGNOSIS — M810 Age-related osteoporosis without current pathological fracture: Secondary | ICD-10-CM | POA: Diagnosis not present

## 2019-05-05 DIAGNOSIS — Z803 Family history of malignant neoplasm of breast: Secondary | ICD-10-CM | POA: Diagnosis not present

## 2019-05-05 DIAGNOSIS — Z888 Allergy status to other drugs, medicaments and biological substances status: Secondary | ICD-10-CM | POA: Diagnosis not present

## 2019-05-05 DIAGNOSIS — Z7901 Long term (current) use of anticoagulants: Secondary | ICD-10-CM | POA: Insufficient documentation

## 2019-05-05 DIAGNOSIS — R600 Localized edema: Secondary | ICD-10-CM | POA: Diagnosis not present

## 2019-05-05 DIAGNOSIS — D693 Immune thrombocytopenic purpura: Secondary | ICD-10-CM | POA: Insufficient documentation

## 2019-05-05 DIAGNOSIS — Z881 Allergy status to other antibiotic agents status: Secondary | ICD-10-CM | POA: Insufficient documentation

## 2019-05-05 DIAGNOSIS — Z950 Presence of cardiac pacemaker: Secondary | ICD-10-CM | POA: Diagnosis not present

## 2019-05-05 DIAGNOSIS — I5032 Chronic diastolic (congestive) heart failure: Secondary | ICD-10-CM

## 2019-05-05 DIAGNOSIS — I4891 Unspecified atrial fibrillation: Secondary | ICD-10-CM

## 2019-05-05 DIAGNOSIS — Z880 Allergy status to penicillin: Secondary | ICD-10-CM | POA: Insufficient documentation

## 2019-05-05 DIAGNOSIS — Z8051 Family history of malignant neoplasm of kidney: Secondary | ICD-10-CM | POA: Insufficient documentation

## 2019-05-05 DIAGNOSIS — I252 Old myocardial infarction: Secondary | ICD-10-CM | POA: Diagnosis not present

## 2019-05-05 DIAGNOSIS — I2510 Atherosclerotic heart disease of native coronary artery without angina pectoris: Secondary | ICD-10-CM

## 2019-05-05 DIAGNOSIS — I495 Sick sinus syndrome: Secondary | ICD-10-CM | POA: Insufficient documentation

## 2019-05-05 DIAGNOSIS — I110 Hypertensive heart disease with heart failure: Secondary | ICD-10-CM | POA: Insufficient documentation

## 2019-05-05 DIAGNOSIS — Z841 Family history of disorders of kidney and ureter: Secondary | ICD-10-CM | POA: Diagnosis not present

## 2019-05-05 LAB — BASIC METABOLIC PANEL WITH GFR
Anion gap: 11 (ref 5–15)
BUN: 52 mg/dL — ABNORMAL HIGH (ref 8–23)
CO2: 24 mmol/L (ref 22–32)
Calcium: 8.7 mg/dL — ABNORMAL LOW (ref 8.9–10.3)
Chloride: 105 mmol/L (ref 98–111)
Creatinine, Ser: 2.59 mg/dL — ABNORMAL HIGH (ref 0.44–1.00)
GFR calc Af Amer: 18 mL/min — ABNORMAL LOW
GFR calc non Af Amer: 15 mL/min — ABNORMAL LOW
Glucose, Bld: 152 mg/dL — ABNORMAL HIGH (ref 70–99)
Potassium: 4.1 mmol/L (ref 3.5–5.1)
Sodium: 140 mmol/L (ref 135–145)

## 2019-05-05 MED ORDER — TORSEMIDE 20 MG PO TABS: 80.0000 mg | ORAL_TABLET | Freq: Two times a day (BID) | ORAL | 5 refills | Status: DC

## 2019-05-05 NOTE — Patient Instructions (Signed)
INCREASE Torsemide to 80mg  (4 tab) twice a day  Labs today We will only contact you if something comes back abnormal or we need to make some changes. Otherwise no news is good news!  You have been referred for home health services to wrap your legs.  You will be notified if and when this visit can take place.   Your physician recommends that you schedule a follow-up appointment in: 2 weeks with an echo and an appointment with the Nurse practitioner  At the [facility], you and your health needs are our priority. As part of our continuing mission to provide you with exceptional heart care, we have created designated Provider Care Teams. These Care Teams include your primary Cardiologist (physician) and Advanced Practice Providers (APPs- Physician Assistants and Nurse Practitioners) who all work together to provide you with the care you need, when you need it.   You may see any of the following providers on your designated Care Team at your next follow up: Marland Kitchen Dr Glori Bickers . Dr Loralie Champagne . Darrick Grinder, NP . Lyda Jester, PA   Please be sure to bring in all your medications bottles to every appointment.

## 2019-05-05 NOTE — Progress Notes (Signed)
Date:  05/05/2019   ID:  [name], DOB 1922/10/21, MRN [id number]  Location: Home  Provider location: [facility] Type of Visit: Established patient  PCP:  Colon Branch, MD  Cardiologist:  Glori Bickers, MD Primary HF: Alfrieda Tarry EP: [facility]  Chief Complaint: Heart Failure follow-up   History of Present Illness:  [name] is a 85 y.o. 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 LHC in 6/05 in setting of NSTEMI: ant apical AK and inf-apical AK, EF 29%, dOM2 70-80% (small). EF has recovered. Also has a h/o HTN, permanent AF with tachy-brady syndrome s/p PPM and fractured pelvis 05/2012.  Admitted 4/27-4/28/18 for evaluation of tachycardia. Had recent holter monitor that showed 6.5 second pause. Coreg stopped and referred to EP. She then developed Afib RVR. With pause, PPM was placed 11/06/16 by Dr. Lovena Le.  Noted to be in Afib with controlled ventricular rate on discharge.   She say Amy Clegg NP-C last week returns for an acute HF visit for increased leg edema with her son. Torsemide increased from 40 bid to 80/40. And metolazone increased for 3-> 4x week. Has lost 5 pounds. Legs still very swollen. Denies SOB, orthopnea or PND. Sleeps in recliner. Last creatinine 2.2 -> 1.79    12/19 Echo EF 55-60%  01/2012 ECHO EF 55% 08/14/13 ECHO EF 55%, normal RV size and systolic function.    [name] denies symptoms worrisome for COVID 19.   Past Medical History:  Diagnosis Date  . AV block, 1st degree   . CAD (coronary artery disease)    Non ST elevation 2005 ; LHC in 6/05 in setting of NSTEMI: ant apical AK and inf-apical AK, EF 29%, dOM2 70-80% (small); findings c/w Tako-Tsubo CM  . Cardiomyopathy, nonischemic ([place]) 04/2008   Likely Tako-Tsubo. Resolved. --dx'd on cath 2005. EF 29% with minimal distal CAD (small OM1 70-80).  Echo 10/09 EF 55%;  echo 01/26/12: EF 55%, mild LAE, PASP 34.  Marland Kitchen CHB  (complete heart block) (HCC)    Intermittent, s/p Saint Jude PPM  . Colitis, ischemic ([place])   . Complication of anesthesia    post anesthesia excessive somnulence  . Compression fracture of C-spine ([place]) 10/10/2011   Fell at home, tx at [facility]  . Diarrhea associated with pseudomembranous colitis 6/11-17/2013   [facility]  . Dyslipidemia   . Fall    with non-healing rib fractures  . Glaucoma   . Granuloma annulare   . History of phlebitis   . Hypertension    SEVERE  . Idiopathic thrombocytopenic purpura (ITP) (HCC)   . Lower extremity edema   . Lumbar stenosis 2004   DR. MARK ROY  . Multiple pelvic fractures ([place]) 05/2012   Dr Adaline Sill , [facility]  . Osteoporosis    Past Surgical History:  Procedure Laterality Date  . ABDOMINAL HYSTERECTOMY    . APPENDECTOMY    . BACK SURGERY  10/2004   LS Disc 4-5 no sx.  Marland Kitchen CARDIAC CATHETERIZATION     12/2003  . CATARACT EXTRACTION, BILATERAL    . COLONOSCOPY  09/2005   adenoma  . COLONOSCOPY W/ POLYPECTOMY    . 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 PACEMAKER IMPLANT N/A 11/06/2016   Procedure: St Jude Pacemaker Implant;  Surgeon: Evans Lance, MD;  Location: [facility];  Service: Cardiovascular;  Laterality: N/A;  . rectal bleed  05-01-06   colonoscopy-ischemic colitis  . ROTATOR CUFF REPAIR    . VERTEBROPLASTY     Dr Gladstone Lighter     Current Outpatient Medications  Medication Sig Dispense Refill  . beta carotene w/minerals (OCUVITE) tablet Take 1 tablet by mouth daily.     . brimonidine (ALPHAGAN) 0.2 % ophthalmic solution Place 1 drop into the right eye 2 (two) times daily.      . Calcium Carbonate-Vit D-Min (CALCIUM 1200 PO) Take 1,200 mg by mouth daily with breakfast.    . carboxymethylcellulose (REFRESH TEARS) 0.5 % SOLN Place 1 drop into both eyes 2 (two) times daily.     . carvedilol (COREG) 12.5 MG tablet TAKE 1 1/2 TABLET TWICE DAILY (18.75 MG TOTAL) 270 tablet 1  . Cholecalciferol (VITAMIN D3) 3000 UNITS TABS  Take 3,000 Units by mouth daily.     . diphenhydrAMINE (BENADRYL) 25 MG tablet Take 25 mg by mouth at bedtime as needed for sleep (and sinus issues).     . docusate sodium (COLACE) 100 MG capsule Take 100 mg by mouth daily as needed for mild constipation. Stool softener     . metolazone (ZAROXOLYN) 2.5 MG tablet 1 tablet Monday, Wednesday, Thursday and Friday in the mornings    . Misc Natural Products (OSTEO BI-FLEX ADV JOINT SHIELD) TABS Take 1 tablet by mouth daily.     Marland Kitchen omeprazole (PRILOSEC) 20 MG capsule Take 20 mg by mouth daily.      . potassium chloride SA (KLOR-CON) 20 MEQ tablet Take 20 mEq by mouth daily.     . timolol (TIMOPTIC) 0.5 % ophthalmic solution Place 1 drop into the right eye 2 (two) times daily.  5  . torsemide (DEMADEX) 20 MG tablet Take 4 tablets (80 mg total) by mouth every morning AND 2 tablets (40 mg total) every evening. 360 tablet 3  . vitamin B-12 (CYANOCOBALAMIN) 1000 MCG tablet Take 1,000 mcg by mouth 2 (two) times daily.     Marland Kitchen warfarin (COUMADIN) 3 MG tablet Take 1 tablet (3 mg total) by mouth daily. 30 tablet 11   No current facility-administered medications for this encounter.     Allergies:   Morphine and related, Oxycodone, Penicillins, Clindamycin/lincomycin, Colchicine, Norvasc [amlodipine besylate], Ofloxacin, Prednisone, Streptomycin, Tape, Tramadol, Alendronate sodium, Amiodarone, and Plaquenil [hydroxychloroquine sulfate]   Social History:  The patient  reports that she has never smoked. She has never used smokeless tobacco. She reports that she does not drink alcohol or use drugs.   Family History:  The patient's family history includes Breast cancer in her sister and sister; Colon cancer in her father; Coronary artery disease in her mother; Heart failure in her mother; Kidney disease in her mother; Rectal cancer in her sister; Renal cancer in her mother; Stroke in her father.   ROS:  Please see the history of present illness.   All other systems are  personally reviewed and negative.  Vitals:   05/05/19 1350  BP: 110/60  Pulse: 92  SpO2: 96%   Wt Readings from Last 3 Encounters:  05/05/19 75.7 kg (166 lb 12.8 oz)  05/01/19 77.6 kg (171 lb)  04/25/19 77.7 kg (171 lb 4 oz)    Exam:  General:  Elderly. In [place]. NAD General:  Well appearing. No resp difficulty HEENT: normal Neck: supple. JVP 7-8 . Carotids 2+ bilat; no bruits. No lymphadenopathy or thryomegaly appreciated. Cor: PMI nondisplaced. Regular rate & rhythm. No rubs, gallops or murmurs. Lungs: clear Abdomen: soft, nontender, nondistended. No  hepatosplenomegaly. No bruits or masses. Good bowel sounds. Extremities: no cyanosis, clubbing, rash, 3-4+ edema from knees down bilaterally  Neuro: alert & orientedx3, cranial nerves grossly intact. moves all 4 extremities w/o difficulty. Affect pleasant   Recent Labs: 03/01/2019: Hemoglobin 10.2; Platelets 56.0; Pro B Natriuretic peptide (BNP) 479.0 04/25/2019: Magnesium 1.4 05/01/2019: B Natriuretic Peptide 357.6; BUN 42; Creatinine, Ser 1.79; Potassium 3.9; Sodium 136  Personally reviewed   Wt Readings from Last 3 Encounters:  05/05/19 75.7 kg (166 lb 12.8 oz)  05/01/19 77.6 kg (171 lb)  04/25/19 77.7 kg (171 lb 4 oz)      ASSESSMENT AND PLAN:  1. Chronic diastolic CHF: History of Takotsubo cardiomyopathy, EF has recovered. - Echo 08/14/13 EF 55-60% with normal RV. - 12/19 Echo EF 55-60%  - R>>L HF symptoms  - Still with marked LE edema. Modestly improved on high dose diuretics - Increase torsemide to 80 mg bid - Will engage HHRN to wrap legs. Advised her to keep legs elevated - Labs today - See back in 2 weeks with echo and labs  2. CAD:  - Minimal branch vessel disease on cath - No s/s ischemia - Off ASA in setting of warfarin. Has refused statins.  3. Permanent AF with Tachybrady syndrome s/p St Jude PPM 11/06/16 - Rate controlled - on warfarin no bleeding   4.  HTN  Blood pressure well controlled. Continue  current regimen.    Signed, Glori Bickers, MD  05/05/2019 2:09 PM  [facility] [address] Heart and [place] [address] [phone] (office) [phone] (fax)

## 2019-05-08 ENCOUNTER — Telehealth (HOSPITAL_COMMUNITY): Payer: Self-pay

## 2019-05-08 NOTE — Telephone Encounter (Signed)
Spoke with [place] of [place], she will evaluate patients referral for home health for compression wraps of lower extremitieis.  She will call us if she has any questions.

## 2019-05-09 ENCOUNTER — Ambulatory Visit (INDEPENDENT_AMBULATORY_CARE_PROVIDER_SITE_OTHER): Payer: Medicare Other | Admitting: *Deleted

## 2019-05-09 DIAGNOSIS — I2510 Atherosclerotic heart disease of native coronary artery without angina pectoris: Secondary | ICD-10-CM | POA: Diagnosis not present

## 2019-05-09 DIAGNOSIS — Z9861 Coronary angioplasty status: Secondary | ICD-10-CM | POA: Diagnosis not present

## 2019-05-09 DIAGNOSIS — Z9841 Cataract extraction status, right eye: Secondary | ICD-10-CM | POA: Diagnosis not present

## 2019-05-09 DIAGNOSIS — I4891 Unspecified atrial fibrillation: Secondary | ICD-10-CM | POA: Diagnosis not present

## 2019-05-09 DIAGNOSIS — H409 Unspecified glaucoma: Secondary | ICD-10-CM | POA: Diagnosis not present

## 2019-05-09 DIAGNOSIS — Z9181 History of falling: Secondary | ICD-10-CM | POA: Diagnosis not present

## 2019-05-09 DIAGNOSIS — Z950 Presence of cardiac pacemaker: Secondary | ICD-10-CM | POA: Diagnosis not present

## 2019-05-09 DIAGNOSIS — L920 Granuloma annulare: Secondary | ICD-10-CM | POA: Diagnosis not present

## 2019-05-09 DIAGNOSIS — Z7901 Long term (current) use of anticoagulants: Secondary | ICD-10-CM | POA: Diagnosis not present

## 2019-05-09 DIAGNOSIS — I5033 Acute on chronic diastolic (congestive) heart failure: Secondary | ICD-10-CM | POA: Diagnosis not present

## 2019-05-09 DIAGNOSIS — E785 Hyperlipidemia, unspecified: Secondary | ICD-10-CM | POA: Diagnosis not present

## 2019-05-09 DIAGNOSIS — I495 Sick sinus syndrome: Secondary | ICD-10-CM | POA: Diagnosis not present

## 2019-05-09 DIAGNOSIS — Z90711 Acquired absence of uterus with remaining cervical stump: Secondary | ICD-10-CM | POA: Diagnosis not present

## 2019-05-09 DIAGNOSIS — I442 Atrioventricular block, complete: Secondary | ICD-10-CM | POA: Diagnosis not present

## 2019-05-09 DIAGNOSIS — Z947 Corneal transplant status: Secondary | ICD-10-CM | POA: Diagnosis not present

## 2019-05-09 DIAGNOSIS — M810 Age-related osteoporosis without current pathological fracture: Secondary | ICD-10-CM | POA: Diagnosis not present

## 2019-05-09 DIAGNOSIS — Z9842 Cataract extraction status, left eye: Secondary | ICD-10-CM | POA: Diagnosis not present

## 2019-05-09 DIAGNOSIS — K559 Vascular disorder of intestine, unspecified: Secondary | ICD-10-CM | POA: Diagnosis not present

## 2019-05-09 DIAGNOSIS — I4821 Permanent atrial fibrillation: Secondary | ICD-10-CM | POA: Diagnosis not present

## 2019-05-09 DIAGNOSIS — Z9049 Acquired absence of other specified parts of digestive tract: Secondary | ICD-10-CM | POA: Diagnosis not present

## 2019-05-09 DIAGNOSIS — D693 Immune thrombocytopenic purpura: Secondary | ICD-10-CM | POA: Diagnosis not present

## 2019-05-09 DIAGNOSIS — I252 Old myocardial infarction: Secondary | ICD-10-CM | POA: Diagnosis not present

## 2019-05-09 DIAGNOSIS — M48061 Spinal stenosis, lumbar region without neurogenic claudication: Secondary | ICD-10-CM | POA: Diagnosis not present

## 2019-05-09 DIAGNOSIS — I110 Hypertensive heart disease with heart failure: Secondary | ICD-10-CM | POA: Diagnosis not present

## 2019-05-09 DIAGNOSIS — Z8672 Personal history of thrombophlebitis: Secondary | ICD-10-CM | POA: Diagnosis not present

## 2019-05-10 ENCOUNTER — Telehealth (HOSPITAL_COMMUNITY): Payer: Self-pay

## 2019-05-10 DIAGNOSIS — H43813 Vitreous degeneration, bilateral: Secondary | ICD-10-CM | POA: Diagnosis not present

## 2019-05-10 DIAGNOSIS — Z961 Presence of intraocular lens: Secondary | ICD-10-CM | POA: Diagnosis not present

## 2019-05-10 DIAGNOSIS — H353130 Nonexudative age-related macular degeneration, bilateral, stage unspecified: Secondary | ICD-10-CM | POA: Diagnosis not present

## 2019-05-10 DIAGNOSIS — H18519 Endothelial corneal dystrophy, unspecified eye: Secondary | ICD-10-CM | POA: Diagnosis not present

## 2019-05-10 DIAGNOSIS — H04123 Dry eye syndrome of bilateral lacrimal glands: Secondary | ICD-10-CM | POA: Diagnosis not present

## 2019-05-10 DIAGNOSIS — H35371 Puckering of macula, right eye: Secondary | ICD-10-CM | POA: Diagnosis not present

## 2019-05-10 NOTE — Telephone Encounter (Signed)
Received message from [facility] asking for orders for PT/OT evaluation as patient can utilize that service for improving strength and balance.  Left vm for her to return call to office

## 2019-05-10 NOTE — Telephone Encounter (Signed)
-----   Message from Jolaine Artist, MD sent at 05/05/2019  9:23 PM EDT ----- Creatinine up. Please repeat BMET on Monday

## 2019-05-10 NOTE — Telephone Encounter (Signed)
Spoke with patient, she said she attempted to reach someone in office after receiving call however she did not get a response.  Pt she said she has an appt tomorrow for labs. Advised we will let her know of results if abnormal

## 2019-05-11 ENCOUNTER — Ambulatory Visit (HOSPITAL_COMMUNITY)
Admission: RE | Admit: 2019-05-11 | Discharge: 2019-05-11 | Disposition: A | Discharge status: Home / Self Care | Payer: Medicare Other | Source: Ambulatory Visit | Attending: Internal Medicine | Admitting: Internal Medicine

## 2019-05-11 ENCOUNTER — Other Ambulatory Visit (HOSPITAL_COMMUNITY): Payer: Medicare Other

## 2019-05-11 ENCOUNTER — Other Ambulatory Visit: Payer: Self-pay

## 2019-05-11 ENCOUNTER — Other Ambulatory Visit (HOSPITAL_COMMUNITY): Payer: Self-pay

## 2019-05-11 DIAGNOSIS — I5032 Chronic diastolic (congestive) heart failure: Secondary | ICD-10-CM

## 2019-05-11 LAB — CUP PACEART REMOTE DEVICE CHECK
Battery Remaining Longevity: 131 mo
Battery Remaining Percentage: 95.5 %
Battery Voltage: 3.01 V
Brady Statistic RV Percent Paced: 12 %
Date Time Interrogation Session: 20201028215452
Implantable Lead Implant Date: 20180427
Implantable Lead Implant Date: 20180427
Implantable Lead Location: 753859
Implantable Lead Location: 753860
Implantable Pulse Generator Implant Date: 20180427
Lead Channel Impedance Value: 490 Ohm
Lead Channel Pacing Threshold Amplitude: 0.5 V
Lead Channel Pacing Threshold Pulse Width: 0.5 ms
Lead Channel Sensing Intrinsic Amplitude: 10.4 mV
Lead Channel Setting Pacing Amplitude: 2.5 V
Lead Channel Setting Pacing Pulse Width: 0.5 ms
Lead Channel Setting Sensing Sensitivity: 2 mV
Pulse Gen Model: 2272
Pulse Gen Serial Number: 8900487

## 2019-05-11 LAB — BASIC METABOLIC PANEL
Anion gap: 13 (ref 5–15)
BUN: 72 mg/dL — ABNORMAL HIGH (ref 8–23)
CO2: 25 mmol/L (ref 22–32)
Calcium: 9 mg/dL (ref 8.9–10.3)
Chloride: 101 mmol/L (ref 98–111)
Creatinine, Ser: 2.52 mg/dL — ABNORMAL HIGH (ref 0.44–1.00)
GFR calc Af Amer: 18 mL/min — ABNORMAL LOW (ref >60)
GFR calc non Af Amer: 16 mL/min — ABNORMAL LOW (ref >60)
Glucose, Bld: 105 mg/dL — ABNORMAL HIGH (ref 70–99)
Potassium: 4.2 mmol/L (ref 3.5–5.1)
Sodium: 139 mmol/L (ref 135–145)

## 2019-05-11 NOTE — Telephone Encounter (Signed)
Called Kim of [place], verbal given for PT/OT evaluation as she states patient is deconditioned and have balance issues on assessment.  She will continue leg wraps for 8 weeks at least.

## 2019-05-15 DIAGNOSIS — I252 Old myocardial infarction: Secondary | ICD-10-CM | POA: Diagnosis not present

## 2019-05-15 DIAGNOSIS — I2510 Atherosclerotic heart disease of native coronary artery without angina pectoris: Secondary | ICD-10-CM | POA: Diagnosis not present

## 2019-05-15 DIAGNOSIS — I110 Hypertensive heart disease with heart failure: Secondary | ICD-10-CM | POA: Diagnosis not present

## 2019-05-15 DIAGNOSIS — I5033 Acute on chronic diastolic (congestive) heart failure: Secondary | ICD-10-CM | POA: Diagnosis not present

## 2019-05-15 DIAGNOSIS — I4821 Permanent atrial fibrillation: Secondary | ICD-10-CM | POA: Diagnosis not present

## 2019-05-15 DIAGNOSIS — M79641 Pain in right hand: Secondary | ICD-10-CM | POA: Insufficient documentation

## 2019-05-15 DIAGNOSIS — I495 Sick sinus syndrome: Secondary | ICD-10-CM | POA: Diagnosis not present

## 2019-05-16 DIAGNOSIS — G5601 Carpal tunnel syndrome, right upper limb: Secondary | ICD-10-CM | POA: Diagnosis not present

## 2019-05-16 DIAGNOSIS — M79641 Pain in right hand: Secondary | ICD-10-CM | POA: Diagnosis not present

## 2019-05-17 DIAGNOSIS — I110 Hypertensive heart disease with heart failure: Secondary | ICD-10-CM | POA: Diagnosis not present

## 2019-05-17 DIAGNOSIS — I252 Old myocardial infarction: Secondary | ICD-10-CM | POA: Diagnosis not present

## 2019-05-17 DIAGNOSIS — G5601 Carpal tunnel syndrome, right upper limb: Secondary | ICD-10-CM | POA: Insufficient documentation

## 2019-05-17 DIAGNOSIS — I495 Sick sinus syndrome: Secondary | ICD-10-CM | POA: Diagnosis not present

## 2019-05-17 DIAGNOSIS — I5033 Acute on chronic diastolic (congestive) heart failure: Secondary | ICD-10-CM | POA: Diagnosis not present

## 2019-05-17 DIAGNOSIS — I2510 Atherosclerotic heart disease of native coronary artery without angina pectoris: Secondary | ICD-10-CM | POA: Diagnosis not present

## 2019-05-17 DIAGNOSIS — I4821 Permanent atrial fibrillation: Secondary | ICD-10-CM | POA: Diagnosis not present

## 2019-05-18 ENCOUNTER — Ambulatory Visit (INDEPENDENT_AMBULATORY_CARE_PROVIDER_SITE_OTHER): Payer: Medicare Other

## 2019-05-18 ENCOUNTER — Other Ambulatory Visit: Payer: Self-pay

## 2019-05-18 DIAGNOSIS — I4891 Unspecified atrial fibrillation: Secondary | ICD-10-CM

## 2019-05-18 DIAGNOSIS — Z5181 Encounter for therapeutic drug level monitoring: Secondary | ICD-10-CM | POA: Diagnosis not present

## 2019-05-18 LAB — POCT INR: INR: 1.8 — AB (ref 2.0–3.0)

## 2019-05-18 NOTE — Patient Instructions (Signed)
Description   Take an extra 1/2 tablet today, then resume same dosage 1/2 tablet daily except for 1 tablet on Mondays, Wednesdays and Fridays.  Recheck INR in 3 weeks. Call the coumadin Clinic if for any changes in medication or upcoming procedures. Coumadin Clinic [phone] Main [phone]

## 2019-05-19 DIAGNOSIS — I495 Sick sinus syndrome: Secondary | ICD-10-CM | POA: Diagnosis not present

## 2019-05-19 DIAGNOSIS — I2510 Atherosclerotic heart disease of native coronary artery without angina pectoris: Secondary | ICD-10-CM | POA: Diagnosis not present

## 2019-05-19 DIAGNOSIS — I252 Old myocardial infarction: Secondary | ICD-10-CM | POA: Diagnosis not present

## 2019-05-19 DIAGNOSIS — I110 Hypertensive heart disease with heart failure: Secondary | ICD-10-CM | POA: Diagnosis not present

## 2019-05-19 DIAGNOSIS — I4821 Permanent atrial fibrillation: Secondary | ICD-10-CM | POA: Diagnosis not present

## 2019-05-19 DIAGNOSIS — I5033 Acute on chronic diastolic (congestive) heart failure: Secondary | ICD-10-CM | POA: Diagnosis not present

## 2019-05-22 ENCOUNTER — Ambulatory Visit (HOSPITAL_COMMUNITY)
Admission: RE | Admit: 2019-05-22 | Discharge: 2019-05-22 | Disposition: A | Discharge status: Home / Self Care | Payer: Medicare Other | Source: Ambulatory Visit | Attending: Internal Medicine | Admitting: Internal Medicine

## 2019-05-22 ENCOUNTER — Encounter (HOSPITAL_COMMUNITY): Payer: Self-pay

## 2019-05-22 ENCOUNTER — Ambulatory Visit (HOSPITAL_BASED_OUTPATIENT_CLINIC_OR_DEPARTMENT_OTHER)
Admission: RE | Admit: 2019-05-22 | Discharge: 2019-05-22 | Disposition: A | Discharge status: Home / Self Care | Payer: Medicare Other | Source: Ambulatory Visit | Attending: Adult Health | Admitting: Adult Health

## 2019-05-22 ENCOUNTER — Other Ambulatory Visit: Payer: Self-pay

## 2019-05-22 VITALS — BP 110/83 | HR 84 | Wt 153.4 lb

## 2019-05-22 DIAGNOSIS — Z800 Family history of malignant neoplasm of digestive organs: Secondary | ICD-10-CM | POA: Diagnosis not present

## 2019-05-22 DIAGNOSIS — I428 Other cardiomyopathies: Secondary | ICD-10-CM | POA: Diagnosis not present

## 2019-05-22 DIAGNOSIS — H409 Unspecified glaucoma: Secondary | ICD-10-CM | POA: Insufficient documentation

## 2019-05-22 DIAGNOSIS — I4821 Permanent atrial fibrillation: Secondary | ICD-10-CM | POA: Diagnosis not present

## 2019-05-22 DIAGNOSIS — E785 Hyperlipidemia, unspecified: Secondary | ICD-10-CM | POA: Insufficient documentation

## 2019-05-22 DIAGNOSIS — Z8051 Family history of malignant neoplasm of kidney: Secondary | ICD-10-CM | POA: Insufficient documentation

## 2019-05-22 DIAGNOSIS — Z8249 Family history of ischemic heart disease and other diseases of the circulatory system: Secondary | ICD-10-CM | POA: Insufficient documentation

## 2019-05-22 DIAGNOSIS — I110 Hypertensive heart disease with heart failure: Secondary | ICD-10-CM | POA: Insufficient documentation

## 2019-05-22 DIAGNOSIS — I495 Sick sinus syndrome: Secondary | ICD-10-CM | POA: Diagnosis not present

## 2019-05-22 DIAGNOSIS — Z803 Family history of malignant neoplasm of breast: Secondary | ICD-10-CM | POA: Insufficient documentation

## 2019-05-22 DIAGNOSIS — Z823 Family history of stroke: Secondary | ICD-10-CM | POA: Insufficient documentation

## 2019-05-22 DIAGNOSIS — Z79899 Other long term (current) drug therapy: Secondary | ICD-10-CM | POA: Diagnosis not present

## 2019-05-22 DIAGNOSIS — D693 Immune thrombocytopenic purpura: Secondary | ICD-10-CM | POA: Diagnosis not present

## 2019-05-22 DIAGNOSIS — I2510 Atherosclerotic heart disease of native coronary artery without angina pectoris: Secondary | ICD-10-CM | POA: Diagnosis not present

## 2019-05-22 DIAGNOSIS — Z888 Allergy status to other drugs, medicaments and biological substances status: Secondary | ICD-10-CM | POA: Diagnosis not present

## 2019-05-22 DIAGNOSIS — Z881 Allergy status to other antibiotic agents status: Secondary | ICD-10-CM | POA: Insufficient documentation

## 2019-05-22 DIAGNOSIS — Z841 Family history of disorders of kidney and ureter: Secondary | ICD-10-CM | POA: Insufficient documentation

## 2019-05-22 DIAGNOSIS — Z950 Presence of cardiac pacemaker: Secondary | ICD-10-CM | POA: Insufficient documentation

## 2019-05-22 DIAGNOSIS — I5032 Chronic diastolic (congestive) heart failure: Secondary | ICD-10-CM

## 2019-05-22 DIAGNOSIS — Z885 Allergy status to narcotic agent status: Secondary | ICD-10-CM | POA: Insufficient documentation

## 2019-05-22 DIAGNOSIS — Z880 Allergy status to penicillin: Secondary | ICD-10-CM | POA: Diagnosis not present

## 2019-05-22 DIAGNOSIS — I252 Old myocardial infarction: Secondary | ICD-10-CM | POA: Diagnosis not present

## 2019-05-22 DIAGNOSIS — Z7901 Long term (current) use of anticoagulants: Secondary | ICD-10-CM | POA: Insufficient documentation

## 2019-05-22 LAB — BASIC METABOLIC PANEL
Anion gap: 13 (ref 5–15)
BUN: 50 mg/dL — ABNORMAL HIGH (ref 8–23)
CO2: 28 mmol/L (ref 22–32)
Calcium: 9 mg/dL (ref 8.9–10.3)
Chloride: 97 mmol/L — ABNORMAL LOW (ref 98–111)
Creatinine, Ser: 2.08 mg/dL — ABNORMAL HIGH (ref 0.44–1.00)
GFR calc Af Amer: 23 mL/min — ABNORMAL LOW (ref >60)
GFR calc non Af Amer: 20 mL/min — ABNORMAL LOW (ref >60)
Glucose, Bld: 118 mg/dL — ABNORMAL HIGH (ref 70–99)
Potassium: 4.4 mmol/L (ref 3.5–5.1)
Sodium: 138 mmol/L (ref 135–145)

## 2019-05-22 NOTE — Patient Instructions (Signed)
It was great to see you today! No medication changes are needed at this time.  Labs today We will only contact you if something comes back abnormal or we need to make some changes. Otherwise no news is good news!  Your physician recommends that you schedule a follow-up appointment in: 4-6 weeks with Dr Haroldine Laws or  in the Advanced Practitioners (PA/NP) Clinic   Do the following things EVERYDAY: 1) Weigh yourself in the morning before breakfast. Write it down and keep it in a log. 2) Take your medicines as prescribed 3) Eat low salt foods-Limit salt (sodium) to 2000 mg per day.  4) Stay as active as you can everyday 5) Limit all fluids for the day to less than 2 liters  At the [facility], you and your health needs are our priority. As part of our continuing mission to provide you with exceptional heart care, we have created designated Provider Care Teams. These Care Teams include your primary Cardiologist (physician) and Advanced Practice Providers (APPs- Physician Assistants and Nurse Practitioners) who all work together to provide you with the care you need, when you need it.   You may see any of the following providers on your designated Care Team at your next follow up: Marland Kitchen Dr Glori Bickers . Dr Loralie Champagne . Darrick Grinder, NP . Lyda Jester, PA   Please be sure to bring in all your medications bottles to every appointment.

## 2019-05-22 NOTE — Progress Notes (Signed)
Date:  05/22/2019   ID:  [name], DOB 12/22/1922, MRN [id number]  Location: Home  Provider location: [facility] Type of Visit: Established patient  PCP:  Colon Branch, MD  Cardiologist:  Glori Bickers, MD Primary HF: [place] EP: [facility]  Chief Complaint: Heart Failure follow-up   History of Present Illness:  [name] is a 85 y.o. 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 LHC in 6/05 in setting of NSTEMI: ant apical AK and inf-apical AK, EF 29%, dOM2 70-80% (small). EF has recovered. Also has a h/o HTN, permanent AF with tachy-brady syndrome s/p PPM and fractured pelvis 05/2012.  Admitted 4/27-4/28/18 for evaluation of tachycardia. Had recent holter monitor that showed 6.5 second pause. Coreg stopped and referred to EP. She then developed Afib RVR. With pause, PPM was placed 11/06/16 by Dr. Lovena Le.  Noted to be in Afib with controlled ventricular rate on discharge.   Seen recently on 10/19 and again 10/26 for volume overload w/ subsequent up titration of diuretic regimen. On 10/26,  Torsemide was further increased to 80 mg bid. Also takes metolazone 4 days a week. Home health was also ordered to assist w/ wrapping of legs to help w/ edema.  She presents back for f/u. Here with son. Doing ok from breathing standpoint. Gets a little short of breath if walking up inclines but no significant dyspnea w/ flat surfaces. She does occassionally feel dizzy and weak but no syncope/ near syncope or falls. HH helped w unna boots and edema has improved. She does complain of nocturnal leg cramps. Compliant w/ K supplementation. Wt overall improved, down from 166 >>153 lb. BP 110/83. She got a repeat 2D echo done today. Results pending.    12/19 Echo EF 55-60%  01/2012 ECHO EF 55% 08/14/13 ECHO EF 55%, normal RV size and systolic function.    ZAVIERA SIDDIQUI denies symptoms worrisome for COVID 19.   Past  Medical History:  Diagnosis Date  . AV block, 1st degree   . CAD (coronary artery disease)    Non ST elevation 2005 ; LHC in 6/05 in setting of NSTEMI: ant apical AK and inf-apical AK, EF 29%, dOM2 70-80% (small); findings c/w Tako-Tsubo CM  . Cardiomyopathy, nonischemic ([place]) 04/2008   Likely Tako-Tsubo. Resolved. --dx'd on cath 2005. EF 29% with minimal distal CAD (small OM1 70-80).  Echo 10/09 EF 55%;  echo 01/26/12: EF 55%, mild LAE, PASP 34.  Marland Kitchen CHB (complete heart block) (HCC)    Intermittent, s/p Saint Jude PPM  . Colitis, ischemic ([place])   . Complication of anesthesia    post anesthesia excessive somnulence  . Compression fracture of C-spine ([place]) 10/10/2011   Fell at home, tx at [facility]  . Diarrhea associated with pseudomembranous colitis 6/11-17/2013   [facility]  . Dyslipidemia   . Fall    with non-healing rib fractures  . Glaucoma   . Granuloma annulare   . History of phlebitis   . Hypertension    SEVERE  . Idiopathic thrombocytopenic purpura (ITP) (HCC)   . Lower extremity edema   . Lumbar stenosis 2004   DR. MARK ROY  . Multiple pelvic fractures ([place]) 05/2012   Dr Adaline Sill , [facility]  . Osteoporosis    Past Surgical History:  Procedure Laterality Date  . ABDOMINAL HYSTERECTOMY    . APPENDECTOMY    . BACK SURGERY  10/2004   LS  Disc 4-5 no sx.  Marland Kitchen CARDIAC CATHETERIZATION     12/2003  . CATARACT EXTRACTION, BILATERAL    . COLONOSCOPY  09/2005   adenoma  . COLONOSCOPY W/ POLYPECTOMY    . 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 PACEMAKER IMPLANT N/A 11/06/2016   Procedure: St Jude Pacemaker Implant;  Surgeon: Evans Lance, MD;  Location: [facility];  Service: Cardiovascular;  Laterality: N/A;  . rectal bleed  05-01-06   colonoscopy-ischemic colitis  . ROTATOR CUFF REPAIR    . VERTEBROPLASTY     Dr Gladstone Lighter     Current Outpatient Medications  Medication Sig Dispense Refill  . beta carotene w/minerals (OCUVITE) tablet Take 1 tablet by mouth daily.      . brimonidine (ALPHAGAN) 0.2 % ophthalmic solution Place 1 drop into the right eye 2 (two) times daily.      . Calcium Carbonate-Vit D-Min (CALCIUM 1200 PO) Take 1,200 mg by mouth daily with breakfast.    . carboxymethylcellulose (REFRESH TEARS) 0.5 % SOLN Place 1 drop into both eyes 2 (two) times daily.     . carvedilol (COREG) 12.5 MG tablet TAKE 1 1/2 TABLET TWICE DAILY (18.75 MG TOTAL) 270 tablet 1  . Cholecalciferol (VITAMIN D3) 3000 UNITS TABS Take 3,000 Units by mouth daily.     . diphenhydrAMINE (BENADRYL) 25 MG tablet Take 25 mg by mouth at bedtime as needed for sleep (and sinus issues).     . docusate sodium (COLACE) 100 MG capsule Take 100 mg by mouth daily as needed for mild constipation. Stool softener     . metolazone (ZAROXOLYN) 2.5 MG tablet 1 tablet Monday, Wednesday, Thursday and Friday in the mornings    . Misc Natural Products (OSTEO BI-FLEX ADV JOINT SHIELD) TABS Take 1 tablet by mouth daily.     Marland Kitchen omeprazole (PRILOSEC) 20 MG capsule Take 20 mg by mouth daily.      . potassium chloride SA (KLOR-CON) 20 MEQ tablet Take 20 mEq by mouth daily.     . timolol (TIMOPTIC) 0.5 % ophthalmic solution Place 1 drop into the right eye 2 (two) times daily.  5  . torsemide (DEMADEX) 20 MG tablet Take 4 tablets (80 mg total) by mouth 2 (two) times daily. 240 tablet 5  . vitamin B-12 (CYANOCOBALAMIN) 1000 MCG tablet Take 1,000 mcg by mouth 2 (two) times daily.     Marland Kitchen warfarin (COUMADIN) 3 MG tablet Take 1 tablet (3 mg total) by mouth daily. 30 tablet 11   No current facility-administered medications for this encounter.     Allergies:   Morphine and related, Oxycodone, Penicillins, Clindamycin/lincomycin, Colchicine, Norvasc [amlodipine besylate], Ofloxacin, Prednisone, Streptomycin, Tape, Tramadol, Alendronate sodium, Amiodarone, and Plaquenil [hydroxychloroquine sulfate]   Social History:  The patient  reports that she has never smoked. She has never used smokeless tobacco. She reports  that she does not drink alcohol or use drugs.   Family History:  The patient's family history includes Breast cancer in her sister and sister; Colon cancer in her father; Coronary artery disease in her mother; Heart failure in her mother; Kidney disease in her mother; Rectal cancer in her sister; Renal cancer in her mother; Stroke in her father.   ROS:  Please see the history of present illness.   All other systems are personally reviewed and negative.  Vitals:   05/22/19 1501  BP: 110/83  Pulse: 84  SpO2: 98%   Wt Readings from Last 3 Encounters:  05/22/19 69.6 kg (153 lb 6.4 oz)  05/05/19 75.7 kg (166 lb 12.8 oz)  05/01/19 77.6 kg (171 lb)    Exam:  PHYSICAL EXAM: General:  Well appearing elderly WF. No respiratory difficulty HEENT: normal Neck: supple. no JVD. Carotids 2+ bilat; no bruits. No lymphadenopathy or thyromegaly appreciated. Cor: PMI nondisplaced. Regular rate & rhythm. No rubs, gallops or murmurs. Lungs: faint rt sided crackles at the base, left lung clear Abdomen: soft, nontender, nondistended. No hepatosplenomegaly. No bruits or masses. Good bowel sounds. Extremities: no cyanosis, clubbing, rash, edema, bilateral unna boots present  Neuro: alert & oriented x 3, cranial nerves grossly intact. moves all 4 extremities w/o difficulty. Affect pleasant.   Recent Labs: 03/01/2019: Hemoglobin 10.2; Platelets 56.0; Pro B Natriuretic peptide (BNP) 479.0 04/25/2019: Magnesium 1.4 05/01/2019: B Natriuretic Peptide 357.6 05/11/2019: BUN 72; Creatinine, Ser 2.52; Potassium 4.2; Sodium 139  Personally reviewed   Wt Readings from Last 3 Encounters:  05/22/19 69.6 kg (153 lb 6.4 oz)  05/05/19 75.7 kg (166 lb 12.8 oz)  05/01/19 77.6 kg (171 lb)      ASSESSMENT AND PLAN:  1. Chronic diastolic CHF: History of Takotsubo cardiomyopathy, EF has recovered. - Echo 08/14/13 EF 55-60% with normal RV. - 12/19 Echo EF 55-60%  - R>>L HF symptoms  - wt improved w/ diuretic increase,  down from 166 >>153 lb. LEE improved. No significant dyspnea.  - Continue torsemide 80 bid + metolazone 4 days a week  - Continue w/ unna boots as needed for edema. H/H to assist - Repeat BMP today. - Repeat Echo done today, results pending.   2. CAD:  - Minimal branch vessel disease on cath - No s/s ischemia - Off ASA in setting of warfarin. Has refused statins.  3. Permanent AF with Tachybrady syndrome s/p St Jude PPM 11/06/16 - Rate controlled - on warfarin. Denies bleeding and falls  4.  HTN  - Blood pressure well controlled. Continue current regimen. -check BMP today     F/u again in 4-6 weeks   Signed, Lyda Jester, PA-C  05/22/2019 3:35 PM  Advanced [facility] Failure [place] [address] Heart and [place] [address] [phone] (office) [phone] (fax)

## 2019-05-22 NOTE — Progress Notes (Signed)
  Echocardiogram 2D Echocardiogram has been performed.  Darlina Sicilian M 05/22/2019, 2:40 PM

## 2019-05-23 DIAGNOSIS — I4821 Permanent atrial fibrillation: Secondary | ICD-10-CM | POA: Diagnosis not present

## 2019-05-23 DIAGNOSIS — I2510 Atherosclerotic heart disease of native coronary artery without angina pectoris: Secondary | ICD-10-CM | POA: Diagnosis not present

## 2019-05-23 DIAGNOSIS — I252 Old myocardial infarction: Secondary | ICD-10-CM | POA: Diagnosis not present

## 2019-05-23 DIAGNOSIS — I5033 Acute on chronic diastolic (congestive) heart failure: Secondary | ICD-10-CM | POA: Diagnosis not present

## 2019-05-23 DIAGNOSIS — I110 Hypertensive heart disease with heart failure: Secondary | ICD-10-CM | POA: Diagnosis not present

## 2019-05-23 DIAGNOSIS — I495 Sick sinus syndrome: Secondary | ICD-10-CM | POA: Diagnosis not present

## 2019-05-24 ENCOUNTER — Telehealth (HOSPITAL_COMMUNITY): Payer: Self-pay

## 2019-05-24 DIAGNOSIS — I2510 Atherosclerotic heart disease of native coronary artery without angina pectoris: Secondary | ICD-10-CM | POA: Diagnosis not present

## 2019-05-24 DIAGNOSIS — I110 Hypertensive heart disease with heart failure: Secondary | ICD-10-CM | POA: Diagnosis not present

## 2019-05-24 DIAGNOSIS — I5033 Acute on chronic diastolic (congestive) heart failure: Secondary | ICD-10-CM | POA: Diagnosis not present

## 2019-05-24 DIAGNOSIS — I4821 Permanent atrial fibrillation: Secondary | ICD-10-CM | POA: Diagnosis not present

## 2019-05-24 DIAGNOSIS — I495 Sick sinus syndrome: Secondary | ICD-10-CM | POA: Diagnosis not present

## 2019-05-24 DIAGNOSIS — I252 Old myocardial infarction: Secondary | ICD-10-CM | POA: Diagnosis not present

## 2019-05-24 NOTE — Telephone Encounter (Signed)
Pt called requesting recent lab and echo results. Results provided.  Verbalized understanding.

## 2019-05-29 DIAGNOSIS — I110 Hypertensive heart disease with heart failure: Secondary | ICD-10-CM | POA: Diagnosis not present

## 2019-05-29 DIAGNOSIS — I252 Old myocardial infarction: Secondary | ICD-10-CM | POA: Diagnosis not present

## 2019-05-29 DIAGNOSIS — I2510 Atherosclerotic heart disease of native coronary artery without angina pectoris: Secondary | ICD-10-CM | POA: Diagnosis not present

## 2019-05-29 DIAGNOSIS — I495 Sick sinus syndrome: Secondary | ICD-10-CM | POA: Diagnosis not present

## 2019-05-29 DIAGNOSIS — I4821 Permanent atrial fibrillation: Secondary | ICD-10-CM | POA: Diagnosis not present

## 2019-05-29 DIAGNOSIS — I5033 Acute on chronic diastolic (congestive) heart failure: Secondary | ICD-10-CM | POA: Diagnosis not present

## 2019-05-30 DIAGNOSIS — I2510 Atherosclerotic heart disease of native coronary artery without angina pectoris: Secondary | ICD-10-CM | POA: Diagnosis not present

## 2019-05-30 DIAGNOSIS — I4821 Permanent atrial fibrillation: Secondary | ICD-10-CM | POA: Diagnosis not present

## 2019-05-30 DIAGNOSIS — I5033 Acute on chronic diastolic (congestive) heart failure: Secondary | ICD-10-CM | POA: Diagnosis not present

## 2019-05-30 DIAGNOSIS — I110 Hypertensive heart disease with heart failure: Secondary | ICD-10-CM | POA: Diagnosis not present

## 2019-05-30 DIAGNOSIS — I495 Sick sinus syndrome: Secondary | ICD-10-CM | POA: Diagnosis not present

## 2019-05-30 DIAGNOSIS — I252 Old myocardial infarction: Secondary | ICD-10-CM | POA: Diagnosis not present

## 2019-05-30 NOTE — Progress Notes (Signed)
Remote pacemaker transmission.   

## 2019-05-31 DIAGNOSIS — I5033 Acute on chronic diastolic (congestive) heart failure: Secondary | ICD-10-CM | POA: Diagnosis not present

## 2019-05-31 DIAGNOSIS — I2510 Atherosclerotic heart disease of native coronary artery without angina pectoris: Secondary | ICD-10-CM | POA: Diagnosis not present

## 2019-05-31 DIAGNOSIS — I495 Sick sinus syndrome: Secondary | ICD-10-CM | POA: Diagnosis not present

## 2019-05-31 DIAGNOSIS — I252 Old myocardial infarction: Secondary | ICD-10-CM | POA: Diagnosis not present

## 2019-05-31 DIAGNOSIS — I4821 Permanent atrial fibrillation: Secondary | ICD-10-CM | POA: Diagnosis not present

## 2019-05-31 DIAGNOSIS — I110 Hypertensive heart disease with heart failure: Secondary | ICD-10-CM | POA: Diagnosis not present

## 2019-06-05 ENCOUNTER — Ambulatory Visit: Payer: Medicare Other | Admitting: Internal Medicine

## 2019-06-05 DIAGNOSIS — I4821 Permanent atrial fibrillation: Secondary | ICD-10-CM | POA: Diagnosis not present

## 2019-06-05 DIAGNOSIS — I252 Old myocardial infarction: Secondary | ICD-10-CM | POA: Diagnosis not present

## 2019-06-05 DIAGNOSIS — I5033 Acute on chronic diastolic (congestive) heart failure: Secondary | ICD-10-CM | POA: Diagnosis not present

## 2019-06-05 DIAGNOSIS — I110 Hypertensive heart disease with heart failure: Secondary | ICD-10-CM | POA: Diagnosis not present

## 2019-06-05 DIAGNOSIS — I495 Sick sinus syndrome: Secondary | ICD-10-CM | POA: Diagnosis not present

## 2019-06-05 DIAGNOSIS — I2510 Atherosclerotic heart disease of native coronary artery without angina pectoris: Secondary | ICD-10-CM | POA: Diagnosis not present

## 2019-06-06 ENCOUNTER — Ambulatory Visit: Payer: Medicare Other | Admitting: Internal Medicine

## 2019-06-06 DIAGNOSIS — I495 Sick sinus syndrome: Secondary | ICD-10-CM | POA: Diagnosis not present

## 2019-06-06 DIAGNOSIS — I4821 Permanent atrial fibrillation: Secondary | ICD-10-CM | POA: Diagnosis not present

## 2019-06-06 DIAGNOSIS — I252 Old myocardial infarction: Secondary | ICD-10-CM | POA: Diagnosis not present

## 2019-06-06 DIAGNOSIS — I2510 Atherosclerotic heart disease of native coronary artery without angina pectoris: Secondary | ICD-10-CM | POA: Diagnosis not present

## 2019-06-06 DIAGNOSIS — I5033 Acute on chronic diastolic (congestive) heart failure: Secondary | ICD-10-CM | POA: Diagnosis not present

## 2019-06-06 DIAGNOSIS — I110 Hypertensive heart disease with heart failure: Secondary | ICD-10-CM | POA: Diagnosis not present

## 2019-06-07 ENCOUNTER — Other Ambulatory Visit: Payer: Self-pay

## 2019-06-07 ENCOUNTER — Ambulatory Visit (INDEPENDENT_AMBULATORY_CARE_PROVIDER_SITE_OTHER): Payer: Medicare Other | Admitting: *Deleted

## 2019-06-07 DIAGNOSIS — I4891 Unspecified atrial fibrillation: Secondary | ICD-10-CM

## 2019-06-07 DIAGNOSIS — Z5181 Encounter for therapeutic drug level monitoring: Secondary | ICD-10-CM

## 2019-06-07 LAB — POCT INR: INR: 1.6 — AB (ref 2.0–3.0)

## 2019-06-07 NOTE — Patient Instructions (Addendum)
Description   Take an extra 1/2 tablet today, change your dose to 1 tablet daily except for 1/2 tablet on Sundays, Tuesdays, Thursdays.  Recheck INR in 2 weeks. Continue your protein drinks 1-2 a day. Call the coumadin Clinic if for any changes in medication or upcoming procedures. Coumadin Clinic [phone] Main [phone]

## 2019-06-08 DIAGNOSIS — I495 Sick sinus syndrome: Secondary | ICD-10-CM | POA: Diagnosis not present

## 2019-06-08 DIAGNOSIS — I442 Atrioventricular block, complete: Secondary | ICD-10-CM | POA: Diagnosis not present

## 2019-06-08 DIAGNOSIS — E785 Hyperlipidemia, unspecified: Secondary | ICD-10-CM | POA: Diagnosis not present

## 2019-06-08 DIAGNOSIS — Z9049 Acquired absence of other specified parts of digestive tract: Secondary | ICD-10-CM | POA: Diagnosis not present

## 2019-06-08 DIAGNOSIS — Z947 Corneal transplant status: Secondary | ICD-10-CM | POA: Diagnosis not present

## 2019-06-08 DIAGNOSIS — H409 Unspecified glaucoma: Secondary | ICD-10-CM | POA: Diagnosis not present

## 2019-06-08 DIAGNOSIS — Z90711 Acquired absence of uterus with remaining cervical stump: Secondary | ICD-10-CM | POA: Diagnosis not present

## 2019-06-08 DIAGNOSIS — I4821 Permanent atrial fibrillation: Secondary | ICD-10-CM | POA: Diagnosis not present

## 2019-06-08 DIAGNOSIS — I110 Hypertensive heart disease with heart failure: Secondary | ICD-10-CM | POA: Diagnosis not present

## 2019-06-08 DIAGNOSIS — Z9181 History of falling: Secondary | ICD-10-CM | POA: Diagnosis not present

## 2019-06-08 DIAGNOSIS — M48061 Spinal stenosis, lumbar region without neurogenic claudication: Secondary | ICD-10-CM | POA: Diagnosis not present

## 2019-06-08 DIAGNOSIS — Z9842 Cataract extraction status, left eye: Secondary | ICD-10-CM | POA: Diagnosis not present

## 2019-06-08 DIAGNOSIS — L920 Granuloma annulare: Secondary | ICD-10-CM | POA: Diagnosis not present

## 2019-06-08 DIAGNOSIS — I2510 Atherosclerotic heart disease of native coronary artery without angina pectoris: Secondary | ICD-10-CM | POA: Diagnosis not present

## 2019-06-08 DIAGNOSIS — K559 Vascular disorder of intestine, unspecified: Secondary | ICD-10-CM | POA: Diagnosis not present

## 2019-06-08 DIAGNOSIS — Z9861 Coronary angioplasty status: Secondary | ICD-10-CM | POA: Diagnosis not present

## 2019-06-08 DIAGNOSIS — Z9841 Cataract extraction status, right eye: Secondary | ICD-10-CM | POA: Diagnosis not present

## 2019-06-08 DIAGNOSIS — Z7901 Long term (current) use of anticoagulants: Secondary | ICD-10-CM | POA: Diagnosis not present

## 2019-06-08 DIAGNOSIS — I5033 Acute on chronic diastolic (congestive) heart failure: Secondary | ICD-10-CM | POA: Diagnosis not present

## 2019-06-08 DIAGNOSIS — I252 Old myocardial infarction: Secondary | ICD-10-CM | POA: Diagnosis not present

## 2019-06-08 DIAGNOSIS — M810 Age-related osteoporosis without current pathological fracture: Secondary | ICD-10-CM | POA: Diagnosis not present

## 2019-06-08 DIAGNOSIS — Z8672 Personal history of thrombophlebitis: Secondary | ICD-10-CM | POA: Diagnosis not present

## 2019-06-08 DIAGNOSIS — Z950 Presence of cardiac pacemaker: Secondary | ICD-10-CM | POA: Diagnosis not present

## 2019-06-08 DIAGNOSIS — D693 Immune thrombocytopenic purpura: Secondary | ICD-10-CM | POA: Diagnosis not present

## 2019-06-12 DIAGNOSIS — I5033 Acute on chronic diastolic (congestive) heart failure: Secondary | ICD-10-CM | POA: Diagnosis not present

## 2019-06-12 DIAGNOSIS — I4821 Permanent atrial fibrillation: Secondary | ICD-10-CM | POA: Diagnosis not present

## 2019-06-12 DIAGNOSIS — I252 Old myocardial infarction: Secondary | ICD-10-CM | POA: Diagnosis not present

## 2019-06-12 DIAGNOSIS — H353134 Nonexudative age-related macular degeneration, bilateral, advanced atrophic with subfoveal involvement: Secondary | ICD-10-CM | POA: Diagnosis not present

## 2019-06-12 DIAGNOSIS — I2510 Atherosclerotic heart disease of native coronary artery without angina pectoris: Secondary | ICD-10-CM | POA: Diagnosis not present

## 2019-06-12 DIAGNOSIS — H5213 Myopia, bilateral: Secondary | ICD-10-CM | POA: Diagnosis not present

## 2019-06-12 DIAGNOSIS — I495 Sick sinus syndrome: Secondary | ICD-10-CM | POA: Diagnosis not present

## 2019-06-12 DIAGNOSIS — I110 Hypertensive heart disease with heart failure: Secondary | ICD-10-CM | POA: Diagnosis not present

## 2019-06-13 ENCOUNTER — Telehealth: Payer: Self-pay | Admitting: Internal Medicine

## 2019-06-13 ENCOUNTER — Other Ambulatory Visit: Payer: Self-pay | Admitting: Internal Medicine

## 2019-06-13 DIAGNOSIS — R269 Unspecified abnormalities of gait and mobility: Secondary | ICD-10-CM

## 2019-06-13 NOTE — Telephone Encounter (Signed)
Please advise- okay to give order?

## 2019-06-13 NOTE — Telephone Encounter (Signed)
First call the patient to be sure she requested it.  If she did, okay to give the order.

## 2019-06-13 NOTE — Telephone Encounter (Signed)
Home Health Verbal Orders - Caller/Agency:OT [facility]/ Santina Evans Number: [phone] Requesting Orders    Front wheel rolling walker for getting to bathroom and due to feeling unsteady / please call [facility] once order is placed or sent off

## 2019-06-13 NOTE — Telephone Encounter (Signed)
Call was from [facility] that we set up for Pt. LMOM w/ verbal orders for walker. Instructed [facility] to call w/ fax number if written order needs to be faxed.

## 2019-06-14 ENCOUNTER — Telehealth (HOSPITAL_COMMUNITY): Payer: Self-pay

## 2019-06-14 NOTE — Telephone Encounter (Signed)
Pt reports she does take her BP daily. She reports it was 100/60 which is normal for her.  Advised of recommendations to hold diuretics for 1-2 days and monitor closely.  She was amenable to seeing PA/NP in clinic next week. appt made for Tuesday. Pt and relative verbalized understanding and appreciation.

## 2019-06-14 NOTE — Telephone Encounter (Signed)
Called to leave the fax # as requested - [place] Fax# [phone] If there is any other information needed, please call [facility] at [phone]

## 2019-06-14 NOTE — Telephone Encounter (Signed)
Order/Rx faxed to number provided.

## 2019-06-14 NOTE — Telephone Encounter (Signed)
Spoke with patient, she notes that she believes her water pills are making her weak and lose weight.  She notes that she is not voiding more than usual and she is eating at baseline.  She is taking torsemide 80mg  bid and metolazone 2.5 three times a week.  She does not have any heart failure symptoms presently.  No cp, sob, or edema. Please advise if any changes need to be made

## 2019-06-14 NOTE — Addendum Note (Signed)
Addended byDamita Dunnings D on: 06/14/2019 11:51 AM   Modules accepted: Orders

## 2019-06-14 NOTE — Telephone Encounter (Signed)
Pt LM on vm yesterday after office hours saying that she has lost 4 lbs in a week and is weak due to increased dose of lasix.  LM for patient to return call to office to discuss.

## 2019-06-14 NOTE — Telephone Encounter (Signed)
Have her check her BP to make sure that she is not hypotensive. She may be a bit dehydrated. She can hold her diuretics for 1-2 days while monitoring wt closely. See about getting her a f/u on APP schedule. Can also get labs to check BMP and CBC.

## 2019-06-20 ENCOUNTER — Encounter (HOSPITAL_COMMUNITY): Payer: Medicare Other

## 2019-06-22 ENCOUNTER — Ambulatory Visit (INDEPENDENT_AMBULATORY_CARE_PROVIDER_SITE_OTHER): Payer: Medicare Other | Admitting: *Deleted

## 2019-06-22 ENCOUNTER — Other Ambulatory Visit: Payer: Self-pay

## 2019-06-22 DIAGNOSIS — Z5181 Encounter for therapeutic drug level monitoring: Secondary | ICD-10-CM

## 2019-06-22 DIAGNOSIS — I4891 Unspecified atrial fibrillation: Secondary | ICD-10-CM | POA: Diagnosis not present

## 2019-06-22 LAB — POCT INR: INR: 8 — AB (ref 2.0–3.0)

## 2019-06-22 LAB — PROTIME-INR
INR: 7.6 (ref 0.9–1.2)
Prothrombin Time: 79.9 s — ABNORMAL HIGH (ref 9.1–12.0)

## 2019-06-22 NOTE — Progress Notes (Signed)
Instructed pt to go to lab. Son with pt and informed them to seek medical attention if pt has any bleeding. Asked pt and son not to take any warfarin until coumadin clinic calls them with further dosing instructions.   Lab called and stated pt's INR was 7.6, called pt's son per pt's request and LMOM.  Called pt and LMOM.  Son called back and gave him updated dosing instructions.

## 2019-06-22 NOTE — Patient Instructions (Addendum)
Description    Called and spoke to pt's son instructed him that pt should not take any coumadin until she comes to her appointment on Monday (12/14) and receives further dosing instructions.   Normal dose: 1 tablet daily except for 1/2 tablet on Sundays, Tuesdays, Thursdays.  Recheck INR in 2 weeks. Continue your protein drinks 1-2 a day. Call the coumadin Clinic if for any changes in medication or upcoming procedures. Coumadin Clinic [phone] Main [phone]

## 2019-06-23 ENCOUNTER — Telehealth (HOSPITAL_COMMUNITY): Payer: Self-pay | Admitting: *Deleted

## 2019-06-23 NOTE — Telephone Encounter (Signed)
Home health RN w/[facility] called to report patient is more fatigued w/ exertion, her bp drops when she stands, and her weight is down 4lbs.  Patient is taking all medications as prescribed. RN asked if we had any medication changes or recommendations.   Routed to [facility] for advice

## 2019-06-26 ENCOUNTER — Encounter (HOSPITAL_COMMUNITY): Payer: Medicare Other

## 2019-06-26 ENCOUNTER — Encounter (HOSPITAL_COMMUNITY): Payer: Self-pay

## 2019-06-26 ENCOUNTER — Other Ambulatory Visit: Payer: Self-pay

## 2019-06-26 ENCOUNTER — Ambulatory Visit (HOSPITAL_COMMUNITY)
Admission: RE | Admit: 2019-06-26 | Discharge: 2019-06-26 | Disposition: A | Discharge status: Home / Self Care | Payer: Medicare Other | Source: Ambulatory Visit | Attending: Adult Health | Admitting: Adult Health

## 2019-06-26 ENCOUNTER — Ambulatory Visit (INDEPENDENT_AMBULATORY_CARE_PROVIDER_SITE_OTHER): Payer: Medicare Other | Admitting: Pharmacist

## 2019-06-26 VITALS — BP 101/63 | HR 80 | Wt 144.4 lb

## 2019-06-26 DIAGNOSIS — I5032 Chronic diastolic (congestive) heart failure: Secondary | ICD-10-CM | POA: Insufficient documentation

## 2019-06-26 DIAGNOSIS — Z7901 Long term (current) use of anticoagulants: Secondary | ICD-10-CM | POA: Insufficient documentation

## 2019-06-26 DIAGNOSIS — R5383 Other fatigue: Secondary | ICD-10-CM

## 2019-06-26 DIAGNOSIS — R0602 Shortness of breath: Secondary | ICD-10-CM | POA: Diagnosis not present

## 2019-06-26 DIAGNOSIS — I4891 Unspecified atrial fibrillation: Secondary | ICD-10-CM | POA: Diagnosis not present

## 2019-06-26 DIAGNOSIS — Z79899 Other long term (current) drug therapy: Secondary | ICD-10-CM | POA: Insufficient documentation

## 2019-06-26 DIAGNOSIS — Z950 Presence of cardiac pacemaker: Secondary | ICD-10-CM | POA: Insufficient documentation

## 2019-06-26 DIAGNOSIS — I2510 Atherosclerotic heart disease of native coronary artery without angina pectoris: Secondary | ICD-10-CM | POA: Insufficient documentation

## 2019-06-26 DIAGNOSIS — E785 Hyperlipidemia, unspecified: Secondary | ICD-10-CM | POA: Diagnosis not present

## 2019-06-26 DIAGNOSIS — I495 Sick sinus syndrome: Secondary | ICD-10-CM | POA: Diagnosis not present

## 2019-06-26 DIAGNOSIS — I110 Hypertensive heart disease with heart failure: Secondary | ICD-10-CM | POA: Diagnosis not present

## 2019-06-26 DIAGNOSIS — D693 Immune thrombocytopenic purpura: Secondary | ICD-10-CM | POA: Insufficient documentation

## 2019-06-26 DIAGNOSIS — H409 Unspecified glaucoma: Secondary | ICD-10-CM | POA: Diagnosis not present

## 2019-06-26 DIAGNOSIS — I252 Old myocardial infarction: Secondary | ICD-10-CM | POA: Insufficient documentation

## 2019-06-26 DIAGNOSIS — I4821 Permanent atrial fibrillation: Secondary | ICD-10-CM | POA: Insufficient documentation

## 2019-06-26 DIAGNOSIS — Z5181 Encounter for therapeutic drug level monitoring: Secondary | ICD-10-CM

## 2019-06-26 DIAGNOSIS — I4819 Other persistent atrial fibrillation: Secondary | ICD-10-CM | POA: Diagnosis not present

## 2019-06-26 DIAGNOSIS — Z8249 Family history of ischemic heart disease and other diseases of the circulatory system: Secondary | ICD-10-CM | POA: Insufficient documentation

## 2019-06-26 LAB — CBC
HCT: 35.8 % — ABNORMAL LOW (ref 36.0–46.0)
Hemoglobin: 11.6 g/dL — ABNORMAL LOW (ref 12.0–15.0)
MCH: 31.1 pg (ref 26.0–34.0)
MCHC: 32.4 g/dL (ref 30.0–36.0)
MCV: 96 fL (ref 80.0–100.0)
Platelets: DECREASED 10*3/uL (ref 150–400)
RBC: 3.73 MIL/uL — ABNORMAL LOW (ref 3.87–5.11)
RDW: 14 % (ref 11.5–15.5)
WBC: 9.1 10*3/uL (ref 4.0–10.5)
nRBC: 0 % (ref 0.0–0.2)

## 2019-06-26 LAB — POCT INR: INR: 3.4 — AB (ref 2.0–3.0)

## 2019-06-26 LAB — BASIC METABOLIC PANEL
Anion gap: 16 — ABNORMAL HIGH (ref 5–15)
BUN: 83 mg/dL — ABNORMAL HIGH (ref 8–23)
CO2: 23 mmol/L (ref 22–32)
Calcium: 8.9 mg/dL (ref 8.9–10.3)
Chloride: 98 mmol/L (ref 98–111)
Creatinine, Ser: 2.42 mg/dL — ABNORMAL HIGH (ref 0.44–1.00)
GFR calc Af Amer: 19 mL/min — ABNORMAL LOW (ref >60)
GFR calc non Af Amer: 17 mL/min — ABNORMAL LOW (ref >60)
Glucose, Bld: 230 mg/dL — ABNORMAL HIGH (ref 70–99)
Potassium: 3.8 mmol/L (ref 3.5–5.1)
Sodium: 137 mmol/L (ref 135–145)

## 2019-06-26 LAB — BRAIN NATRIURETIC PEPTIDE: B Natriuretic Peptide: 222.6 pg/mL — ABNORMAL HIGH (ref 0.0–100.0)

## 2019-06-26 MED ORDER — CARVEDILOL 12.5 MG PO TABS: 12.5000 mg | ORAL_TABLET | Freq: Two times a day (BID) | ORAL | 1 refills | Status: DC

## 2019-06-26 NOTE — Progress Notes (Signed)
Date:  06/26/2019   ID:  [name], DOB Jan 28, 1923, MRN [id number]  Location: Home  Provider location: [facility] Type of Visit: Established patient  PCP:  Colon Branch, MD  Cardiologist:  Glori Bickers, MD Primary HF: Bensimhon EP: [facility]  Chief Complaint: Heart Failure follow-up   History of Present Illness: KADIATOU COSSON is a 85 y.o. 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 LHC in 6/05 in setting of NSTEMI: ant apical AK and inf-apical AK, EF 29%, dOM2 70-80% (small). EF has recovered. Also has a h/o HTN, permanent AF with tachy-brady syndrome s/p PPM and fractured pelvis 05/2012.  Admitted 4/27-4/28/18 for evaluation of tachycardia. Had recent holter monitor that showed 6.5 second pause. Coreg stopped and referred to EP. She then developed Afib RVR. With pause, PPM was placed 11/06/16 by Dr. Lovena Le.  Noted to be in Afib with controlled ventricular rate on discharge.   Seen recently on 10/19 and again 10/26 for volume overload w/ subsequent up titration of diuretic regimen. On 10/26,  Torsemide was further increased to 80 mg bid. Also takes metolazone 4 days a week. Home health was also ordered to assist w/ wrapping of legs to help w/ edema.  Today she returns for HF follow up with her sone. .Overall feeling fair. Weight at home has been trending down from 148--->141. No energy. Mild SOB with exertion. Denies PND/Orthopnea. Appetite terrible. No fever or chills. Taking all medications. She is able to take all medications independently. Lives alone. Has HH 2 days a week.   01/2012 ECHO EF 55% 08/14/13 ECHO EF 55%, normal RV size and systolic function.  12/19 Echo EF 55-60%  Rainah F Goleman denies symptoms worrisome for COVID 19.   Past Medical History:  Diagnosis Date  . AV block, 1st degree   . CAD (coronary artery disease)    Non ST elevation 2005 ; LHC in 6/05 in setting of NSTEMI: ant  apical AK and inf-apical AK, EF 29%, dOM2 70-80% (small); findings c/w Tako-Tsubo CM  . Cardiomyopathy, nonischemic ([place]) 04/2008   Likely Tako-Tsubo. Resolved. --dx'd on cath 2005. EF 29% with minimal distal CAD (small OM1 70-80).  Echo 10/09 EF 55%;  echo 01/26/12: EF 55%, mild LAE, PASP 34.  Marland Kitchen CHB (complete heart block) (HCC)    Intermittent, s/p Saint Jude PPM  . Colitis, ischemic ([place])   . Complication of anesthesia    post anesthesia excessive somnulence  . Compression fracture of C-spine ([place]) 10/10/2011   Fell at home, tx at [facility]  . Diarrhea associated with pseudomembranous colitis 6/11-17/2013   [facility]  . Dyslipidemia   . Fall    with non-healing rib fractures  . Glaucoma   . Granuloma annulare   . History of phlebitis   . Hypertension    SEVERE  . Idiopathic thrombocytopenic purpura (ITP) (HCC)   . Lower extremity edema   . Lumbar stenosis 2004   DR. MARK ROY  . Multiple pelvic fractures ([place]) 05/2012   Dr Adaline Sill , [facility]  . Osteoporosis    Past Surgical History:  Procedure Laterality Date  . ABDOMINAL HYSTERECTOMY    . APPENDECTOMY    . BACK SURGERY  10/2004   LS Disc 4-5 no sx.  Marland Kitchen CARDIAC CATHETERIZATION     12/2003  . CATARACT EXTRACTION, BILATERAL    . COLONOSCOPY  09/2005   adenoma  . COLONOSCOPY W/ POLYPECTOMY    .  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 PACEMAKER IMPLANT N/A 11/06/2016   Procedure: St Jude Pacemaker Implant;  Surgeon: Evans Lance, MD;  Location: [facility];  Service: Cardiovascular;  Laterality: N/A;  . rectal bleed  05-01-06   colonoscopy-ischemic colitis  . ROTATOR CUFF REPAIR    . VERTEBROPLASTY     Dr Gladstone Lighter     Current Outpatient Medications  Medication Sig Dispense Refill  . beta carotene w/minerals (OCUVITE) tablet Take 1 tablet by mouth daily.     . brimonidine (ALPHAGAN) 0.2 % ophthalmic solution Place 1 drop into the right eye 2 (two) times daily.      . Calcium Carbonate-Vit D-Min (CALCIUM 1200  PO) Take 1,200 mg by mouth daily with breakfast.    . carboxymethylcellulose (REFRESH TEARS) 0.5 % SOLN Place 1 drop into both eyes 2 (two) times daily.     . carvedilol (COREG) 12.5 MG tablet TAKE 1.5 TABLET TWICE DAILY (18.75 MG TOTAL) 270 tablet 1  . Cholecalciferol (VITAMIN D3) 3000 UNITS TABS Take 3,000 Units by mouth daily.     . diphenhydrAMINE (BENADRYL) 25 MG tablet Take 25 mg by mouth at bedtime as needed for sleep (and sinus issues).     . docusate sodium (COLACE) 100 MG capsule Take 100 mg by mouth daily as needed for mild constipation. Stool softener     . metolazone (ZAROXOLYN) 2.5 MG tablet 1 tablet Monday, Wednesday, Thursday and Friday in the mornings    . Misc Natural Products (OSTEO BI-FLEX ADV JOINT SHIELD) TABS Take 1 tablet by mouth daily.     Marland Kitchen omeprazole (PRILOSEC) 20 MG capsule Take 20 mg by mouth daily.      . potassium chloride SA (KLOR-CON) 20 MEQ tablet Take 20 mEq by mouth daily.     . timolol (TIMOPTIC) 0.5 % ophthalmic solution Place 1 drop into the right eye 2 (two) times daily.  5  . torsemide (DEMADEX) 20 MG tablet Take 4 tablets (80 mg total) by mouth 2 (two) times daily. 240 tablet 5  . vitamin B-12 (CYANOCOBALAMIN) 1000 MCG tablet Take 1,000 mcg by mouth 2 (two) times daily.     Marland Kitchen warfarin (COUMADIN) 3 MG tablet Take 1 tablet (3 mg total) by mouth daily. 30 tablet 11   No current facility-administered medications for this encounter.    Allergies:   Morphine and related, Oxycodone, Penicillins, Clindamycin/lincomycin, Colchicine, Norvasc [amlodipine besylate], Ofloxacin, Prednisone, Streptomycin, Tape, Tramadol, Alendronate sodium, Amiodarone, and Plaquenil [hydroxychloroquine sulfate]   Social History:  The patient  reports that she has never smoked. She has never used smokeless tobacco. She reports that she does not drink alcohol or use drugs.   Family History:  The patient's family history includes Breast cancer in her sister and sister; Colon cancer in  her father; Coronary artery disease in her mother; Heart failure in her mother; Kidney disease in her mother; Rectal cancer in her sister; Renal cancer in her mother; Stroke in her father.   ROS:  Please see the history of present illness.   All other systems are personally reviewed and negative.  Vitals:   06/26/19 1455  BP: 101/63  Pulse: 80  SpO2: 100%   Wt Readings from Last 3 Encounters:  06/26/19 65.5 kg (144 lb 6.4 oz)  05/22/19 69.6 kg (153 lb 6.4 oz)  05/05/19 75.7 kg (166 lb 12.8 oz)    Exam:  PHYSICAL EXAM: General:  Arrived in a wheel chair. Elderly. No  resp difficulty HEENT: normal Neck: supple. no JVD. Carotids 2+ bilat; no bruits. No lymphadenopathy or thryomegaly appreciated. Cor: PMI nondisplaced. Regular rate & rhythm. No rubs, gallops or murmurs. Lungs: clear Abdomen: soft, nontender, nondistended. No hepatosplenomegaly. No bruits or masses. Good bowel sounds. Extremities: no cyanosis, clubbing, rash, R and LLE trace edema Neuro: alert & orientedx3, cranial nerves grossly intact. moves all 4 extremities w/o difficulty. Affect pleasant .   Recent Labs: 03/01/2019: Hemoglobin 10.2; Platelets 56.0; Pro B Natriuretic peptide (BNP) 479.0 04/25/2019: Magnesium 1.4 05/01/2019: B Natriuretic Peptide 357.6 05/22/2019: BUN 50; Creatinine, Ser 2.08; Potassium 4.4; Sodium 138  Personally reviewed   Wt Readings from Last 3 Encounters:  06/26/19 65.5 kg (144 lb 6.4 oz)  05/22/19 69.6 kg (153 lb 6.4 oz)  05/05/19 75.7 kg (166 lb 12.8 oz)      ASSESSMENT AND PLAN:  1. Chronic diastolic CHF: History of Takotsubo cardiomyopathy, EF has recovered. - Echo 05/2019 EF 55-60%  - R>>L HF symptoms  - NYHA III. Volume status low. Hold metolazone for now. Stop potassium. Suspect her weight should level out around 146-148 pounds.  - Hold torsemide tonight and tomorrow then resume torsemide 80 mg twice a day.  - Cut back carvedilol to 12.5 mg twice a day.  - Check BMET and BNP  today.  2. CAD:  - Minimal branch vessel disease on cath -No chest pain.  - Off ASA in setting of warfarin. Has refused statins.  3. Permanent AF with Tachybrady syndrome s/p St Jude PPM 11/06/16 -rate controlled.  - on warfarin. Denies bleeding and falls  4.  HTN   Stable.   5. Fatigue.  Check CBC, BMET today.    Follow up in 3 weeks. I have asked her to call HF clinic should her weight go up or if she is more short of breath.    Jeanmarie Hubert, NP  06/26/2019 3:09 PM  [facility] [place] [address] Heart and [place] [address] [phone] (office) [phone] (fax)

## 2019-06-26 NOTE — Patient Instructions (Signed)
DECREASE Carvedilol to 12.5 mg, one tab twice daily STOP Potassium STOP Metolazone  HOLD Torsemide today and tomorrow, restart 06/28/19 at normal dose of 80 mg twice daily  Labs today We will only contact you if something comes back abnormal or we need to make some changes. Otherwise no news is good news!  Your physician recommends that you schedule a follow-up appointment in: 3 weeks  in the Advanced Practitioners (PA/NP) Clinic   Do the following things EVERYDAY: 1) Weigh yourself in the morning before breakfast. Write it down and keep it in a log. 2) Take your medicines as prescribed 3) Eat low salt foods--Limit salt (sodium) to 2000 mg per day.  4) Stay as active as you can everyday 5) Limit all fluids for the day to less than 2 liters  At the [facility], you and your health needs are our priority. As part of our continuing mission to provide you with exceptional heart care, we have created designated Provider Care Teams. These Care Teams include your primary Cardiologist (physician) and Advanced Practice Providers (APPs- Physician Assistants and Nurse Practitioners) who all work together to provide you with the care you need, when you need it.   You may see any of the following providers on your designated Care Team at your next follow up: Marland Kitchen Dr Glori Bickers . Dr Loralie Champagne . Darrick Grinder, NP . Lyda Jester, PA . Audry Riles, PharmD   Please be sure to bring in all your medications bottles to every appointment.

## 2019-06-26 NOTE — Patient Instructions (Signed)
Hold coumadin today and then take 1 tablet daily except for 1/2 tablet on Sundays, Tuesdays, Thursdays.  Recheck INR in 2 weeks. Continue your protein drinks 1-2 a day. Call the coumadin Clinic if for any changes in medication or upcoming procedures. Coumadin Clinic [phone] Main [phone]

## 2019-06-26 NOTE — Telephone Encounter (Signed)
Can reduce coreg dose down to 12.5 mg bid. ?Any weight changes. If wt has been stable and if no signs of volume overload (LEE or wt gain), may reduce Torsemide dose down from 80 mg bid to 80/40. Check to see when the last time she use metolazone. She may be dehydrated. See if home health can check a BMP and BMP and send results to [place].

## 2019-06-27 DIAGNOSIS — M79641 Pain in right hand: Secondary | ICD-10-CM | POA: Diagnosis not present

## 2019-06-27 DIAGNOSIS — G5601 Carpal tunnel syndrome, right upper limb: Secondary | ICD-10-CM | POA: Diagnosis not present

## 2019-07-05 ENCOUNTER — Other Ambulatory Visit: Payer: Self-pay | Admitting: Internal Medicine

## 2019-07-08 DIAGNOSIS — Z8672 Personal history of thrombophlebitis: Secondary | ICD-10-CM | POA: Diagnosis not present

## 2019-07-08 DIAGNOSIS — L920 Granuloma annulare: Secondary | ICD-10-CM | POA: Diagnosis not present

## 2019-07-08 DIAGNOSIS — I442 Atrioventricular block, complete: Secondary | ICD-10-CM | POA: Diagnosis not present

## 2019-07-08 DIAGNOSIS — H409 Unspecified glaucoma: Secondary | ICD-10-CM | POA: Diagnosis not present

## 2019-07-08 DIAGNOSIS — Z950 Presence of cardiac pacemaker: Secondary | ICD-10-CM | POA: Diagnosis not present

## 2019-07-08 DIAGNOSIS — I2510 Atherosclerotic heart disease of native coronary artery without angina pectoris: Secondary | ICD-10-CM | POA: Diagnosis not present

## 2019-07-08 DIAGNOSIS — I4821 Permanent atrial fibrillation: Secondary | ICD-10-CM | POA: Diagnosis not present

## 2019-07-08 DIAGNOSIS — Z9861 Coronary angioplasty status: Secondary | ICD-10-CM | POA: Diagnosis not present

## 2019-07-08 DIAGNOSIS — L89312 Pressure ulcer of right buttock, stage 2: Secondary | ICD-10-CM | POA: Diagnosis not present

## 2019-07-08 DIAGNOSIS — Z7901 Long term (current) use of anticoagulants: Secondary | ICD-10-CM | POA: Diagnosis not present

## 2019-07-08 DIAGNOSIS — M810 Age-related osteoporosis without current pathological fracture: Secondary | ICD-10-CM | POA: Diagnosis not present

## 2019-07-08 DIAGNOSIS — K559 Vascular disorder of intestine, unspecified: Secondary | ICD-10-CM | POA: Diagnosis not present

## 2019-07-08 DIAGNOSIS — I252 Old myocardial infarction: Secondary | ICD-10-CM | POA: Diagnosis not present

## 2019-07-08 DIAGNOSIS — L89322 Pressure ulcer of left buttock, stage 2: Secondary | ICD-10-CM | POA: Diagnosis not present

## 2019-07-08 DIAGNOSIS — Z90711 Acquired absence of uterus with remaining cervical stump: Secondary | ICD-10-CM | POA: Diagnosis not present

## 2019-07-08 DIAGNOSIS — I5033 Acute on chronic diastolic (congestive) heart failure: Secondary | ICD-10-CM | POA: Diagnosis not present

## 2019-07-08 DIAGNOSIS — Z947 Corneal transplant status: Secondary | ICD-10-CM | POA: Diagnosis not present

## 2019-07-08 DIAGNOSIS — I110 Hypertensive heart disease with heart failure: Secondary | ICD-10-CM | POA: Diagnosis not present

## 2019-07-08 DIAGNOSIS — I5181 Takotsubo syndrome: Secondary | ICD-10-CM | POA: Diagnosis not present

## 2019-07-08 DIAGNOSIS — D693 Immune thrombocytopenic purpura: Secondary | ICD-10-CM | POA: Diagnosis not present

## 2019-07-08 DIAGNOSIS — Z9049 Acquired absence of other specified parts of digestive tract: Secondary | ICD-10-CM | POA: Diagnosis not present

## 2019-07-08 DIAGNOSIS — M48061 Spinal stenosis, lumbar region without neurogenic claudication: Secondary | ICD-10-CM | POA: Diagnosis not present

## 2019-07-08 DIAGNOSIS — Z9181 History of falling: Secondary | ICD-10-CM | POA: Diagnosis not present

## 2019-07-08 DIAGNOSIS — I495 Sick sinus syndrome: Secondary | ICD-10-CM | POA: Diagnosis not present

## 2019-07-08 DIAGNOSIS — E785 Hyperlipidemia, unspecified: Secondary | ICD-10-CM | POA: Diagnosis not present

## 2019-07-10 ENCOUNTER — Ambulatory Visit (INDEPENDENT_AMBULATORY_CARE_PROVIDER_SITE_OTHER): Payer: Medicare Other | Admitting: *Deleted

## 2019-07-10 ENCOUNTER — Other Ambulatory Visit: Payer: Self-pay

## 2019-07-10 DIAGNOSIS — Z5181 Encounter for therapeutic drug level monitoring: Secondary | ICD-10-CM

## 2019-07-10 DIAGNOSIS — I4891 Unspecified atrial fibrillation: Secondary | ICD-10-CM

## 2019-07-10 LAB — POCT INR: INR: 2.8 (ref 2.0–3.0)

## 2019-07-10 NOTE — Patient Instructions (Signed)
Description   Continue to take 1 tablet daily except for 1/2 tablet on Sundays, Tuesdays, Thursdays.  Recheck INR in 3 weeks. Continue your protein drinks 1-2 a day. Call the coumadin Clinic if for any changes in medication or upcoming procedures. Coumadin Clinic [phone] Main [phone]

## 2019-07-11 DIAGNOSIS — I2510 Atherosclerotic heart disease of native coronary artery without angina pectoris: Secondary | ICD-10-CM | POA: Diagnosis not present

## 2019-07-11 DIAGNOSIS — I110 Hypertensive heart disease with heart failure: Secondary | ICD-10-CM | POA: Diagnosis not present

## 2019-07-11 DIAGNOSIS — I252 Old myocardial infarction: Secondary | ICD-10-CM | POA: Diagnosis not present

## 2019-07-11 DIAGNOSIS — L89312 Pressure ulcer of right buttock, stage 2: Secondary | ICD-10-CM | POA: Diagnosis not present

## 2019-07-11 DIAGNOSIS — I5033 Acute on chronic diastolic (congestive) heart failure: Secondary | ICD-10-CM | POA: Diagnosis not present

## 2019-07-11 DIAGNOSIS — L89322 Pressure ulcer of left buttock, stage 2: Secondary | ICD-10-CM | POA: Diagnosis not present

## 2019-07-13 DIAGNOSIS — L89322 Pressure ulcer of left buttock, stage 2: Secondary | ICD-10-CM | POA: Diagnosis not present

## 2019-07-13 DIAGNOSIS — I2510 Atherosclerotic heart disease of native coronary artery without angina pectoris: Secondary | ICD-10-CM | POA: Diagnosis not present

## 2019-07-13 DIAGNOSIS — I110 Hypertensive heart disease with heart failure: Secondary | ICD-10-CM | POA: Diagnosis not present

## 2019-07-13 DIAGNOSIS — L89312 Pressure ulcer of right buttock, stage 2: Secondary | ICD-10-CM | POA: Diagnosis not present

## 2019-07-13 DIAGNOSIS — I5033 Acute on chronic diastolic (congestive) heart failure: Secondary | ICD-10-CM | POA: Diagnosis not present

## 2019-07-13 DIAGNOSIS — I252 Old myocardial infarction: Secondary | ICD-10-CM | POA: Diagnosis not present

## 2019-07-15 ENCOUNTER — Other Ambulatory Visit (HOSPITAL_COMMUNITY): Payer: Self-pay | Admitting: Internal Medicine

## 2019-07-17 DIAGNOSIS — L89312 Pressure ulcer of right buttock, stage 2: Secondary | ICD-10-CM | POA: Diagnosis not present

## 2019-07-17 DIAGNOSIS — I110 Hypertensive heart disease with heart failure: Secondary | ICD-10-CM | POA: Diagnosis not present

## 2019-07-17 DIAGNOSIS — I2510 Atherosclerotic heart disease of native coronary artery without angina pectoris: Secondary | ICD-10-CM | POA: Diagnosis not present

## 2019-07-17 DIAGNOSIS — L89322 Pressure ulcer of left buttock, stage 2: Secondary | ICD-10-CM | POA: Diagnosis not present

## 2019-07-17 DIAGNOSIS — I252 Old myocardial infarction: Secondary | ICD-10-CM | POA: Diagnosis not present

## 2019-07-17 DIAGNOSIS — I5033 Acute on chronic diastolic (congestive) heart failure: Secondary | ICD-10-CM | POA: Diagnosis not present

## 2019-07-20 ENCOUNTER — Encounter (HOSPITAL_COMMUNITY): Payer: Self-pay

## 2019-07-20 ENCOUNTER — Other Ambulatory Visit: Payer: Self-pay

## 2019-07-20 ENCOUNTER — Ambulatory Visit (HOSPITAL_COMMUNITY)
Admission: RE | Admit: 2019-07-20 | Discharge: 2019-07-20 | Disposition: A | Discharge status: Home / Self Care | Payer: Medicare Other | Source: Ambulatory Visit | Attending: Cardiology | Admitting: Cardiology

## 2019-07-20 VITALS — BP 118/72 | HR 92 | Wt 151.4 lb

## 2019-07-20 DIAGNOSIS — E785 Hyperlipidemia, unspecified: Secondary | ICD-10-CM | POA: Insufficient documentation

## 2019-07-20 DIAGNOSIS — Z7901 Long term (current) use of anticoagulants: Secondary | ICD-10-CM | POA: Diagnosis not present

## 2019-07-20 DIAGNOSIS — H409 Unspecified glaucoma: Secondary | ICD-10-CM | POA: Diagnosis not present

## 2019-07-20 DIAGNOSIS — I110 Hypertensive heart disease with heart failure: Secondary | ICD-10-CM | POA: Insufficient documentation

## 2019-07-20 DIAGNOSIS — I495 Sick sinus syndrome: Secondary | ICD-10-CM | POA: Insufficient documentation

## 2019-07-20 DIAGNOSIS — I5032 Chronic diastolic (congestive) heart failure: Secondary | ICD-10-CM

## 2019-07-20 DIAGNOSIS — I252 Old myocardial infarction: Secondary | ICD-10-CM | POA: Diagnosis not present

## 2019-07-20 DIAGNOSIS — I4821 Permanent atrial fibrillation: Secondary | ICD-10-CM | POA: Diagnosis not present

## 2019-07-20 DIAGNOSIS — Z79899 Other long term (current) drug therapy: Secondary | ICD-10-CM | POA: Insufficient documentation

## 2019-07-20 DIAGNOSIS — I2510 Atherosclerotic heart disease of native coronary artery without angina pectoris: Secondary | ICD-10-CM | POA: Diagnosis not present

## 2019-07-20 DIAGNOSIS — Z950 Presence of cardiac pacemaker: Secondary | ICD-10-CM | POA: Insufficient documentation

## 2019-07-20 DIAGNOSIS — D693 Immune thrombocytopenic purpura: Secondary | ICD-10-CM | POA: Diagnosis not present

## 2019-07-20 DIAGNOSIS — Z8249 Family history of ischemic heart disease and other diseases of the circulatory system: Secondary | ICD-10-CM | POA: Insufficient documentation

## 2019-07-20 LAB — BASIC METABOLIC PANEL
Anion gap: 16 — ABNORMAL HIGH (ref 5–15)
BUN: 57 mg/dL — ABNORMAL HIGH (ref 8–23)
CO2: 23 mmol/L (ref 22–32)
Calcium: 8.6 mg/dL — ABNORMAL LOW (ref 8.9–10.3)
Chloride: 100 mmol/L (ref 98–111)
Creatinine, Ser: 2.27 mg/dL — ABNORMAL HIGH (ref 0.44–1.00)
GFR calc Af Amer: 21 mL/min — ABNORMAL LOW (ref >60)
GFR calc non Af Amer: 18 mL/min — ABNORMAL LOW (ref >60)
Glucose, Bld: 105 mg/dL — ABNORMAL HIGH (ref 70–99)
Potassium: 3.3 mmol/L — ABNORMAL LOW (ref 3.5–5.1)
Sodium: 139 mmol/L (ref 135–145)

## 2019-07-20 LAB — CBC
HCT: 33.6 % — ABNORMAL LOW (ref 36.0–46.0)
Hemoglobin: 10.8 g/dL — ABNORMAL LOW (ref 12.0–15.0)
MCH: 30.9 pg (ref 26.0–34.0)
MCHC: 32.1 g/dL (ref 30.0–36.0)
MCV: 96.3 fL (ref 80.0–100.0)
Platelets: 66 10*3/uL — ABNORMAL LOW (ref 150–400)
RBC: 3.49 MIL/uL — ABNORMAL LOW (ref 3.87–5.11)
RDW: 15 % (ref 11.5–15.5)
WBC: 7.5 10*3/uL (ref 4.0–10.5)
nRBC: 0 % (ref 0.0–0.2)

## 2019-07-20 MED ORDER — TORSEMIDE 20 MG PO TABS: 80.0000 mg | ORAL_TABLET | Freq: Two times a day (BID) | ORAL | 5 refills | Status: DC

## 2019-07-20 NOTE — Patient Instructions (Addendum)
Lab work done today. We will notify you of any abnormal lab work. No news is good news!  CHANGE Torsemide 80mg  two times daily. YOU MAKE TAKE 1 EXTRA tab of Torsemide in the morning for a weight gain of 3 pounds or greater in 24 hours.  Please follow up with the [facility] in 6 weeks.  At the [facility], you and your health needs are our priority. As part of our continuing mission to provide you with exceptional heart care, we have created designated Provider Care Teams. These Care Teams include your primary Cardiologist (physician) and Advanced Practice Providers (APPs- Physician Assistants and Nurse Practitioners) who all work together to provide you with the care you need, when you need it.   You may see any of the following providers on your designated Care Team at your next follow up: Marland Kitchen Dr Glori Bickers . Dr Loralie Champagne . Darrick Grinder, NP . Lyda Jester, PA . Audry Riles, PharmD   Please be sure to bring in all your medications bottles to every appointment.

## 2019-07-20 NOTE — Progress Notes (Signed)
Date:  07/20/2019   ID:  [name], DOB [birth year], MRN [id number]  Location: Home  Provider location: [facility] Type of Visit: Established patient  PCP:  Colon Branch, MD  Cardiologist:  Glori Bickers, MD Primary HF: Bensimhon EP: [facility]  Chief Complaint: Heart Failure follow-up   History of Present Illness: [name] is a 86 y.o. 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 LHC in 6/05 in setting of NSTEMI: ant apical AK and inf-apical AK, EF 29%, dOM2 70-80% (small). EF has recovered. Also has a h/o HTN, permanent AF with tachy-brady syndrome s/p PPM and fractured pelvis 05/2012.  Admitted 4/27-4/28/18 for evaluation of tachycardia. Had recent holter monitor that showed 6.5 second pause. Coreg stopped and referred to EP. She then developed Afib RVR. With pause, PPM was placed 11/06/16 by Dr. Lovena Le.  Noted to be in Afib with controlled ventricular rate on discharge.   Seen recently on 10/19 and again 10/26 for volume overload w/ subsequent up titration of diuretic regimen. On 10/26,  Torsemide was further increased to 80 mg bid. Also takes metolazone 4 days a week. Home health was also ordered to assist w/ wrapping of legs to help w/ edema.  Seen back in clinic on 06/26/19. Overall feeling fair. Weight decreased from 148--->141 lb. No energy. Mild SOB with exertion. Denied PND/Orthopnea. Appetite terrible. No fever or chills. She was felt to be hypovolemic from over diuresis.  Her meds were adjusted. Metolazone was discontinued and she was instructed to hold torsemide x 2 days then resume 80 mg bid. Also instructed to reduce coreg down to 12.5 mg bid.   Presents back to clinic for f/u. Here with her son. Feels better. More energy. Appetite improved. Her son has notice a big difference. Wts at home have been stable ~142-145 lb. No resting dyspnea. Has stable mild exertional dyspnea w/ ambulation around  home (unchanged). Has trace bilateral LEE. It is difficult for her to apply compression stockings at home. BP 118/72 before meds. ReDs clip 37%.    Cardiac Studies 01/2012 ECHO EF 55%  08/14/13 ECHO EF 55%, normal RV size and systolic function.   12/19 Echo EF 55-60%  [name] denies symptoms worrisome for COVID 19.   Past Medical History:  Diagnosis Date  . AV block, 1st degree   . CAD (coronary artery disease)    Non ST elevation 2005 ; LHC in 6/05 in setting of NSTEMI: ant apical AK and inf-apical AK, EF 29%, dOM2 70-80% (small); findings c/w Tako-Tsubo CM  . Cardiomyopathy, nonischemic ([place]) 04/2008   Likely Tako-Tsubo. Resolved. --dx'd on cath 2005. EF 29% with minimal distal CAD (small OM1 70-80).  Echo 10/09 EF 55%;  echo 01/26/12: EF 55%, mild LAE, PASP 34.  Marland Kitchen CHB (complete heart block) (HCC)    Intermittent, s/p Saint Jude PPM  . Colitis, ischemic ([place])   . Complication of anesthesia    post anesthesia excessive somnulence  . Compression fracture of C-spine ([place]) 10/10/2011   Fell at home, tx at [facility]  . Diarrhea associated with pseudomembranous colitis 6/11-17/2013   [facility]  . Dyslipidemia   . Fall    with non-healing rib fractures  . Glaucoma   . Granuloma annulare   . History of phlebitis   . Hypertension    SEVERE  . Idiopathic thrombocytopenic purpura (ITP) (HCC)   . Lower extremity edema   .  Lumbar stenosis 2004   DR. MARK ROY  . Multiple pelvic fractures ([place]) 05/2012   Dr Adaline Sill , [facility]  . Osteoporosis    Past Surgical History:  Procedure Laterality Date  . ABDOMINAL HYSTERECTOMY    . APPENDECTOMY    . BACK SURGERY  10/2004   LS Disc 4-5 no sx.  Marland Kitchen CARDIAC CATHETERIZATION     12/2003  . CATARACT EXTRACTION, BILATERAL    . COLONOSCOPY  09/2005   adenoma  . COLONOSCOPY W/ POLYPECTOMY    . 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 PACEMAKER IMPLANT N/A 11/06/2016   Procedure: St Jude Pacemaker Implant;  Surgeon: Evans Lance, MD;   Location: [facility];  Service: Cardiovascular;  Laterality: N/A;  . rectal bleed  05-01-06   colonoscopy-ischemic colitis  . ROTATOR CUFF REPAIR    . VERTEBROPLASTY     Dr Gladstone Lighter     Current Outpatient Medications  Medication Sig Dispense Refill  . brimonidine (ALPHAGAN) 0.2 % ophthalmic solution Place 1 drop into the right eye 2 (two) times daily.      . Calcium Carbonate-Vit D-Min (CALCIUM 1200 PO) Take 1,200 mg by mouth daily with breakfast.    . carboxymethylcellulose (REFRESH TEARS) 0.5 % SOLN Place 1 drop into both eyes 2 (two) times daily.     . carvedilol (COREG) 12.5 MG tablet Take 1 tablet (12.5 mg total) by mouth 2 (two) times daily with a meal. 270 tablet 1  . Cholecalciferol (VITAMIN D3) 3000 UNITS TABS Take 3,000 Units by mouth daily.     . diphenhydrAMINE (BENADRYL) 25 MG tablet Take 25 mg by mouth at bedtime as needed for sleep (and sinus issues).     . docusate sodium (COLACE) 100 MG capsule Take 100 mg by mouth daily as needed for mild constipation. Stool softener     . Misc Natural Products (OSTEO BI-FLEX ADV JOINT SHIELD) TABS Take 1 tablet by mouth daily.     Marland Kitchen omeprazole (PRILOSEC) 20 MG capsule Take 20 mg by mouth daily.      . timolol (TIMOPTIC) 0.5 % ophthalmic solution Place 1 drop into the right eye 2 (two) times daily.  5  . torsemide (DEMADEX) 20 MG tablet Take 4 tablets (80 mg total) by mouth 2 (two) times daily. 240 tablet 5  . vitamin B-12 (CYANOCOBALAMIN) 1000 MCG tablet Take 1,000 mcg by mouth 2 (two) times daily.     Marland Kitchen warfarin (COUMADIN) 3 MG tablet Take 1 tablet (3 mg total) by mouth daily. 30 tablet 11  . beta carotene w/minerals (OCUVITE) tablet Take 1 tablet by mouth daily.      No current facility-administered medications for this encounter.    Allergies:   Morphine and related, Oxycodone, Penicillins, Clindamycin/lincomycin, Colchicine, Norvasc [amlodipine besylate], Ofloxacin, Prednisone, Streptomycin, Tape, Tramadol, Alendronate  sodium, Amiodarone, and Plaquenil [hydroxychloroquine sulfate]   Social History:  The patient  reports that she has never smoked. She has never used smokeless tobacco. She reports that she does not drink alcohol or use drugs.   Family History:  The patient's family history includes Breast cancer in her sister and sister; Colon cancer in her father; Coronary artery disease in her mother; Heart failure in her mother; Kidney disease in her mother; Rectal cancer in her sister; Renal cancer in her mother; Stroke in her father.   ROS:  Please see the history of present illness.   All other systems are personally reviewed and negative.  Vitals:  07/20/19 1331  BP: 118/72  Pulse: 92  SpO2: 93%   Wt Readings from Last 3 Encounters:  07/20/19 68.7 kg (151 lb 6.4 oz)  06/26/19 65.5 kg (144 lb 6.4 oz)  05/22/19 69.6 kg (153 lb 6.4 oz)   ReDS Vest / Clip - 07/20/19 1300      ReDS Vest / Clip   Station Marker  B    Ruler Value  21    ReDS Value Range  Moderate volume overload    ReDS Actual Value  37      PHYSICAL EXAM: General:  Thin elderly WF. In wheelchair. Well appearing. No respiratory difficulty HEENT: normal Neck: supple. no JVD. Carotids 2+ bilat; no bruits. No lymphadenopathy or thyromegaly appreciated. Cor: PMI nondisplaced. Regular rate & rhythm. No rubs, gallops or murmurs. Lungs: clear Abdomen: soft, nontender, nondistended. No hepatosplenomegaly. No bruits or masses. Good bowel sounds. Extremities: no cyanosis, clubbing, rash, trace bilateral LE edema Neuro: alert & oriented x 3, cranial nerves grossly intact. moves all 4 extremities w/o difficulty. Affect pleasant.   Recent Labs: 03/01/2019: Pro B Natriuretic peptide (BNP) 479.0 04/25/2019: Magnesium 1.4 06/26/2019: B Natriuretic Peptide 222.6; BUN 83; Creatinine, Ser 2.42; Hemoglobin 11.6; Platelets PLATELET CLUMPS NOTED ON SMEAR, COUNT APPEARS DECREASED; Potassium 3.8; Sodium 137  Personally reviewed   Wt Readings from  Last 3 Encounters:  07/20/19 68.7 kg (151 lb 6.4 oz)  06/26/19 65.5 kg (144 lb 6.4 oz)  05/22/19 69.6 kg (153 lb 6.4 oz)      ASSESSMENT AND PLAN:  1. Chronic diastolic CHF: History of Takotsubo cardiomyopathy, EF has recovered. - Echo 05/2019 EF 55-60%  - R>>L HF symptoms  - NYHA III. Volume ok (previously felt to be hypovolemic and diuretics reduced). Her home wts have returned to baseline ~142-145 lb. Symptoms improved w/ diuretic change but has mild bilateral  LEE on exam. ReDs Clip 37%. BP 118/72 prior to taking any meds today.   - Will continue current torsemide dosing, 80 mg bid. Pt instructed to adjust dose based on wt gain. If she gain > 3 lb in 24 hrs, she will take an additional 20 mg w/ am dose (100 mg/ 80) and will notify our office if wt does not return to baseline - Continue carvedilol to 12.5 mg twice a day.  - Check BMET and pro BNP today.   2. CAD:  - Minimal branch vessel disease on cath -No s/s of ischemia  - Off ASA in setting of warfarin. Has refused statins.  3. Permanent AF with Tachybrady syndrome s/p St Jude PPM 11/06/16 -rate controlled.  - on warfarin. Denies bleeding and falls - Check CBC today   4.  HTN   -Stable.  - continue current regimen    F/u in 6 wks.    Signed, Lyda Jester, PA-C  07/20/2019 2:06 PM  [facility] [place] [address] Heart and [place] [address] [phone] (office) [phone] (fax)

## 2019-07-21 ENCOUNTER — Telehealth (HOSPITAL_COMMUNITY): Payer: Self-pay

## 2019-07-21 DIAGNOSIS — I5033 Acute on chronic diastolic (congestive) heart failure: Secondary | ICD-10-CM | POA: Diagnosis not present

## 2019-07-21 DIAGNOSIS — L89312 Pressure ulcer of right buttock, stage 2: Secondary | ICD-10-CM | POA: Diagnosis not present

## 2019-07-21 DIAGNOSIS — I2510 Atherosclerotic heart disease of native coronary artery without angina pectoris: Secondary | ICD-10-CM | POA: Diagnosis not present

## 2019-07-21 DIAGNOSIS — I5032 Chronic diastolic (congestive) heart failure: Secondary | ICD-10-CM

## 2019-07-21 DIAGNOSIS — L89322 Pressure ulcer of left buttock, stage 2: Secondary | ICD-10-CM | POA: Diagnosis not present

## 2019-07-21 DIAGNOSIS — I110 Hypertensive heart disease with heart failure: Secondary | ICD-10-CM | POA: Diagnosis not present

## 2019-07-21 DIAGNOSIS — I252 Old myocardial infarction: Secondary | ICD-10-CM | POA: Diagnosis not present

## 2019-07-21 MED ORDER — POTASSIUM CHLORIDE CRYS ER 20 MEQ PO TBCR: 20.0000 meq | EXTENDED_RELEASE_TABLET | Freq: Every day | ORAL | 3 refills | Status: DC

## 2019-07-21 NOTE — Telephone Encounter (Signed)
-----   Message from Consuelo Pandy, [place] sent at 07/21/2019 11:27 AM EST ----- Renal function stable. K low. Add 20 mEq Kdur daily to regimen. Repeat BMP in 7-10 days.

## 2019-07-21 NOTE — Telephone Encounter (Signed)
Called pt to review recent lab work. Pt agreeable to med changes and f/u labs. meds sent to preferred pharmacy.

## 2019-07-24 DIAGNOSIS — I252 Old myocardial infarction: Secondary | ICD-10-CM | POA: Diagnosis not present

## 2019-07-24 DIAGNOSIS — L89322 Pressure ulcer of left buttock, stage 2: Secondary | ICD-10-CM | POA: Diagnosis not present

## 2019-07-24 DIAGNOSIS — I5033 Acute on chronic diastolic (congestive) heart failure: Secondary | ICD-10-CM | POA: Diagnosis not present

## 2019-07-24 DIAGNOSIS — I2510 Atherosclerotic heart disease of native coronary artery without angina pectoris: Secondary | ICD-10-CM | POA: Diagnosis not present

## 2019-07-24 DIAGNOSIS — L89312 Pressure ulcer of right buttock, stage 2: Secondary | ICD-10-CM | POA: Diagnosis not present

## 2019-07-24 DIAGNOSIS — I110 Hypertensive heart disease with heart failure: Secondary | ICD-10-CM | POA: Diagnosis not present

## 2019-07-24 LAB — CUP PACEART INCLINIC DEVICE CHECK
Date Time Interrogation Session: 20200901164556
Implantable Lead Implant Date: 20180427
Implantable Lead Implant Date: 20180427
Implantable Lead Location: 753859
Implantable Lead Location: 753860
Implantable Pulse Generator Implant Date: 20180427
Pulse Gen Model: 2272
Pulse Gen Serial Number: 8900487

## 2019-07-28 DIAGNOSIS — L89312 Pressure ulcer of right buttock, stage 2: Secondary | ICD-10-CM | POA: Diagnosis not present

## 2019-07-28 DIAGNOSIS — I2510 Atherosclerotic heart disease of native coronary artery without angina pectoris: Secondary | ICD-10-CM | POA: Diagnosis not present

## 2019-07-28 DIAGNOSIS — I252 Old myocardial infarction: Secondary | ICD-10-CM | POA: Diagnosis not present

## 2019-07-28 DIAGNOSIS — L89322 Pressure ulcer of left buttock, stage 2: Secondary | ICD-10-CM | POA: Diagnosis not present

## 2019-07-28 DIAGNOSIS — I5033 Acute on chronic diastolic (congestive) heart failure: Secondary | ICD-10-CM | POA: Diagnosis not present

## 2019-07-28 DIAGNOSIS — I110 Hypertensive heart disease with heart failure: Secondary | ICD-10-CM | POA: Diagnosis not present

## 2019-07-31 DIAGNOSIS — I252 Old myocardial infarction: Secondary | ICD-10-CM | POA: Diagnosis not present

## 2019-07-31 DIAGNOSIS — L89312 Pressure ulcer of right buttock, stage 2: Secondary | ICD-10-CM | POA: Diagnosis not present

## 2019-07-31 DIAGNOSIS — I5033 Acute on chronic diastolic (congestive) heart failure: Secondary | ICD-10-CM | POA: Diagnosis not present

## 2019-07-31 DIAGNOSIS — I2510 Atherosclerotic heart disease of native coronary artery without angina pectoris: Secondary | ICD-10-CM | POA: Diagnosis not present

## 2019-07-31 DIAGNOSIS — L89322 Pressure ulcer of left buttock, stage 2: Secondary | ICD-10-CM | POA: Diagnosis not present

## 2019-07-31 DIAGNOSIS — I110 Hypertensive heart disease with heart failure: Secondary | ICD-10-CM | POA: Diagnosis not present

## 2019-08-03 ENCOUNTER — Other Ambulatory Visit: Payer: Self-pay

## 2019-08-03 ENCOUNTER — Ambulatory Visit (INDEPENDENT_AMBULATORY_CARE_PROVIDER_SITE_OTHER): Payer: Medicare Other | Admitting: *Deleted

## 2019-08-03 ENCOUNTER — Ambulatory Visit (HOSPITAL_COMMUNITY)
Admission: RE | Admit: 2019-08-03 | Discharge: 2019-08-03 | Disposition: A | Discharge status: Home / Self Care | Payer: Medicare Other | Source: Ambulatory Visit | Attending: Cardiology | Admitting: Cardiology

## 2019-08-03 DIAGNOSIS — I5032 Chronic diastolic (congestive) heart failure: Secondary | ICD-10-CM | POA: Insufficient documentation

## 2019-08-03 DIAGNOSIS — Z5181 Encounter for therapeutic drug level monitoring: Secondary | ICD-10-CM

## 2019-08-03 DIAGNOSIS — I4891 Unspecified atrial fibrillation: Secondary | ICD-10-CM

## 2019-08-03 LAB — BASIC METABOLIC PANEL
Anion gap: 11 (ref 5–15)
BUN: 72 mg/dL — ABNORMAL HIGH (ref 8–23)
CO2: 25 mmol/L (ref 22–32)
Calcium: 9 mg/dL (ref 8.9–10.3)
Chloride: 100 mmol/L (ref 98–111)
Creatinine, Ser: 2.08 mg/dL — ABNORMAL HIGH (ref 0.44–1.00)
GFR calc Af Amer: 23 mL/min — ABNORMAL LOW (ref >60)
GFR calc non Af Amer: 20 mL/min — ABNORMAL LOW (ref >60)
Glucose, Bld: 112 mg/dL — ABNORMAL HIGH (ref 70–99)
Potassium: 3.9 mmol/L (ref 3.5–5.1)
Sodium: 136 mmol/L (ref 135–145)

## 2019-08-03 LAB — POCT INR: INR: 4.8 — AB (ref 2.0–3.0)

## 2019-08-03 NOTE — Patient Instructions (Addendum)
Description   Hold warfarin today and tomorrow, then continue to take 1/2 a tablet daily, except for 1 tablet on Monday, Wednesday and Friday. Continue your protein drinks 1-2 a week. Recheck INR in 2 weeks. Call the coumadin Clinic if for any changes in medication or upcoming procedures. Coumadin Clinic [phone] Main [phone]

## 2019-08-04 ENCOUNTER — Telehealth (HOSPITAL_COMMUNITY): Payer: Self-pay

## 2019-08-04 DIAGNOSIS — I110 Hypertensive heart disease with heart failure: Secondary | ICD-10-CM | POA: Diagnosis not present

## 2019-08-04 DIAGNOSIS — I252 Old myocardial infarction: Secondary | ICD-10-CM | POA: Diagnosis not present

## 2019-08-04 DIAGNOSIS — I5033 Acute on chronic diastolic (congestive) heart failure: Secondary | ICD-10-CM | POA: Diagnosis not present

## 2019-08-04 DIAGNOSIS — I2510 Atherosclerotic heart disease of native coronary artery without angina pectoris: Secondary | ICD-10-CM | POA: Diagnosis not present

## 2019-08-04 DIAGNOSIS — L89322 Pressure ulcer of left buttock, stage 2: Secondary | ICD-10-CM | POA: Diagnosis not present

## 2019-08-04 DIAGNOSIS — I5022 Chronic systolic (congestive) heart failure: Secondary | ICD-10-CM

## 2019-08-04 DIAGNOSIS — L89312 Pressure ulcer of right buttock, stage 2: Secondary | ICD-10-CM | POA: Diagnosis not present

## 2019-08-04 NOTE — Telephone Encounter (Signed)
Received call from [place]. States that pt has significant swelling in legs from Monday. Pt denies shortness of breath. States that pt may be having resistance to Torsemide because she will take morning dose at 7 am and it doesn't kick in until the afternoon at her afternoon dose and that her afternoon dose makes her stay up at night urinating. Advice?

## 2019-08-04 NOTE — Telephone Encounter (Signed)
She can increase her AM dose of torsemide to 100 mg for a few days. Continue 80 mg qPM.   Do 100 qam/ 80qpm x 2 days + Kdur 40 mEq x 2 days. Then return back to torsemide 80 bid + 20 Meq kdur there after. If swelling does not improve by Monday, increase torsemide to 100 mg bid and come in mid week for labs, would check BNP and BMP.   Monitor wt closely.  Add compression stockings Avoid Salt Elevate legs above level of heart to help w/ edema, when time allows.    Also let pt known that I reviewed her BMP from yesterday. SCr and K stable.   Lyda Jester, PA-C

## 2019-08-07 ENCOUNTER — Other Ambulatory Visit: Payer: Self-pay

## 2019-08-07 DIAGNOSIS — I5033 Acute on chronic diastolic (congestive) heart failure: Secondary | ICD-10-CM | POA: Diagnosis not present

## 2019-08-07 DIAGNOSIS — I110 Hypertensive heart disease with heart failure: Secondary | ICD-10-CM | POA: Diagnosis not present

## 2019-08-07 DIAGNOSIS — Z947 Corneal transplant status: Secondary | ICD-10-CM | POA: Diagnosis not present

## 2019-08-07 DIAGNOSIS — I252 Old myocardial infarction: Secondary | ICD-10-CM | POA: Diagnosis not present

## 2019-08-07 DIAGNOSIS — M810 Age-related osteoporosis without current pathological fracture: Secondary | ICD-10-CM | POA: Diagnosis not present

## 2019-08-07 DIAGNOSIS — H409 Unspecified glaucoma: Secondary | ICD-10-CM | POA: Diagnosis not present

## 2019-08-07 DIAGNOSIS — L920 Granuloma annulare: Secondary | ICD-10-CM | POA: Diagnosis not present

## 2019-08-07 DIAGNOSIS — E785 Hyperlipidemia, unspecified: Secondary | ICD-10-CM | POA: Diagnosis not present

## 2019-08-07 DIAGNOSIS — Z950 Presence of cardiac pacemaker: Secondary | ICD-10-CM | POA: Diagnosis not present

## 2019-08-07 DIAGNOSIS — L89312 Pressure ulcer of right buttock, stage 2: Secondary | ICD-10-CM | POA: Diagnosis not present

## 2019-08-07 DIAGNOSIS — I442 Atrioventricular block, complete: Secondary | ICD-10-CM | POA: Diagnosis not present

## 2019-08-07 DIAGNOSIS — Z7901 Long term (current) use of anticoagulants: Secondary | ICD-10-CM | POA: Diagnosis not present

## 2019-08-07 DIAGNOSIS — D693 Immune thrombocytopenic purpura: Secondary | ICD-10-CM | POA: Diagnosis not present

## 2019-08-07 DIAGNOSIS — I495 Sick sinus syndrome: Secondary | ICD-10-CM | POA: Diagnosis not present

## 2019-08-07 DIAGNOSIS — I4821 Permanent atrial fibrillation: Secondary | ICD-10-CM | POA: Diagnosis not present

## 2019-08-07 DIAGNOSIS — Z8672 Personal history of thrombophlebitis: Secondary | ICD-10-CM | POA: Diagnosis not present

## 2019-08-07 DIAGNOSIS — I5181 Takotsubo syndrome: Secondary | ICD-10-CM | POA: Diagnosis not present

## 2019-08-07 DIAGNOSIS — Z90711 Acquired absence of uterus with remaining cervical stump: Secondary | ICD-10-CM | POA: Diagnosis not present

## 2019-08-07 DIAGNOSIS — Z9181 History of falling: Secondary | ICD-10-CM | POA: Diagnosis not present

## 2019-08-07 DIAGNOSIS — K559 Vascular disorder of intestine, unspecified: Secondary | ICD-10-CM | POA: Diagnosis not present

## 2019-08-07 DIAGNOSIS — M48061 Spinal stenosis, lumbar region without neurogenic claudication: Secondary | ICD-10-CM | POA: Diagnosis not present

## 2019-08-07 DIAGNOSIS — Z9049 Acquired absence of other specified parts of digestive tract: Secondary | ICD-10-CM | POA: Diagnosis not present

## 2019-08-07 DIAGNOSIS — Z9861 Coronary angioplasty status: Secondary | ICD-10-CM | POA: Diagnosis not present

## 2019-08-07 DIAGNOSIS — I2510 Atherosclerotic heart disease of native coronary artery without angina pectoris: Secondary | ICD-10-CM | POA: Diagnosis not present

## 2019-08-07 DIAGNOSIS — L89322 Pressure ulcer of left buttock, stage 2: Secondary | ICD-10-CM | POA: Diagnosis not present

## 2019-08-07 NOTE — Telephone Encounter (Signed)
LATE ENTRY 08/04/19   Called nurse back and relayed verbal orders. Verbalized understanding.

## 2019-08-07 NOTE — Telephone Encounter (Signed)
HHRN called back stating pt only lost 1lbs and that her legs are still very swollen. Pt did wear compression hose all weekend but legs are still the same as Friday.  RN instructed to give pt torsemide 100mg  bid with 40bid of K until her lab visit on Wednesday.

## 2019-08-07 NOTE — Addendum Note (Signed)
Addended by: Harvie Junior on: 08/07/2019 02:16 PM   Modules accepted: Orders

## 2019-08-08 ENCOUNTER — Other Ambulatory Visit: Payer: Self-pay

## 2019-08-08 ENCOUNTER — Ambulatory Visit (INDEPENDENT_AMBULATORY_CARE_PROVIDER_SITE_OTHER): Payer: Medicare Other | Admitting: *Deleted

## 2019-08-08 ENCOUNTER — Encounter: Payer: Self-pay | Admitting: Internal Medicine

## 2019-08-08 ENCOUNTER — Ambulatory Visit (INDEPENDENT_AMBULATORY_CARE_PROVIDER_SITE_OTHER): Payer: Medicare Other | Admitting: Internal Medicine

## 2019-08-08 VITALS — BP 95/55 | HR 87 | Temp 97.2°F | Resp 16 | Ht 64.0 in

## 2019-08-08 DIAGNOSIS — I5032 Chronic diastolic (congestive) heart failure: Secondary | ICD-10-CM

## 2019-08-08 DIAGNOSIS — L603 Nail dystrophy: Secondary | ICD-10-CM

## 2019-08-08 DIAGNOSIS — M2550 Pain in unspecified joint: Secondary | ICD-10-CM

## 2019-08-08 DIAGNOSIS — I4891 Unspecified atrial fibrillation: Secondary | ICD-10-CM | POA: Diagnosis not present

## 2019-08-08 MED ORDER — DICLOFENAC SODIUM 1 % EX GEL: 2.0000 g | Freq: Four times a day (QID) | CUTANEOUS | 1 refills | Status: AC | PRN

## 2019-08-08 NOTE — Progress Notes (Signed)
Subjective:    Patient ID: [name], female    DOB: 10-31-1922, 86 y.o.   MRN: [id number]  DOS:  08/08/2019 Type of visit - description: Acute Having pain at the feet, mostly at the right great toe when the nail hits the shoe. Denies redness, swelling, discharge.  Today he also reports generalized pain: "Every bone hurt". From her wrist to her fingers, ankles, knees, etc. Tylenol is not controlling her pain enough.   Wt Readings from Last 3 Encounters:  07/20/19 151 lb 6.4 oz (68.7 kg)  06/26/19 144 lb 6.4 oz (65.5 kg)  05/22/19 153 lb 6.4 oz (69.6 kg)     Review of Systems   Denies fever chills.  No headache No weight loss  She denies chest pain, difficulty breathing.  No cough   Past Medical History:  Diagnosis Date  . AV block, 1st degree   . CAD (coronary artery disease)    Non ST elevation 2005 ; LHC in 6/05 in setting of NSTEMI: ant apical AK and inf-apical AK, EF 29%, dOM2 70-80% (small); findings c/w Tako-Tsubo CM  . Cardiomyopathy, nonischemic ([place]) 04/2008   Likely Tako-Tsubo. Resolved. --dx'd on cath 2005. EF 29% with minimal distal CAD (small OM1 70-80).  Echo 10/09 EF 55%;  echo 01/26/12: EF 55%, mild LAE, PASP 34.  Marland Kitchen CHB (complete heart block) (HCC)    Intermittent, s/p Saint Jude PPM  . Colitis, ischemic ([place])   . Complication of anesthesia    post anesthesia excessive somnulence  . Compression fracture of C-spine ([place]) 10/10/2011   Fell at home, tx at [facility]  . Diarrhea associated with pseudomembranous colitis 6/11-17/2013   [facility]  . Dyslipidemia   . Fall    with non-healing rib fractures  . Glaucoma   . Granuloma annulare   . History of phlebitis   . Hypertension    SEVERE  . Idiopathic thrombocytopenic purpura (ITP) (HCC)   . Lower extremity edema   . Lumbar stenosis 2004   DR. MARK ROY  . Multiple pelvic fractures ([place]) 05/2012   Dr Adaline Sill , [facility]  . Osteoporosis     Past Surgical History:  Procedure Laterality Date   . ABDOMINAL HYSTERECTOMY    . APPENDECTOMY    . BACK SURGERY  10/2004   LS Disc 4-5 no sx.  Marland Kitchen CARDIAC CATHETERIZATION     12/2003  . CATARACT EXTRACTION, BILATERAL    . COLONOSCOPY  09/2005   adenoma  . COLONOSCOPY W/ POLYPECTOMY    . 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 PACEMAKER IMPLANT N/A 11/06/2016   Procedure: St Jude Pacemaker Implant;  Surgeon: Evans Lance, MD;  Location: [facility];  Service: Cardiovascular;  Laterality: N/A;  . rectal bleed  05-01-06   colonoscopy-ischemic colitis  . ROTATOR CUFF REPAIR    . VERTEBROPLASTY     Dr Gladstone Lighter        Objective:   Physical Exam BP (!) 86/41 (BP Location: Left Arm, Patient Position: Sitting, Cuff Size: Small)   Pulse 91   Temp (!) 97.2 F (36.2 C) (Temporal)   Resp 16   Ht _0  (1.626 m)   SpO2 99%   BMI 25.99 kg/m  General:   Well developed, NAD, BMI noted. HEENT:  Normocephalic . Face symmetric, atraumatic Lungs:  CTA B Normal respiratory effort, no intercostal retractions, no accessory muscle use. Heart: RRR,  no murmur.  + Pretibial edema bilaterally  Lower extremities:  Toenails are dystrophic, mostly on the right side. Pedal pulses present. No obvious cellulitis or other infections. MSK: Right hand and wrist with changes of DJD Left hand and wrist: Changes with DJD and also some puffiness but no redness, warmness. neurologic:  alert & oriented X3.  Speech normal, gait not tested Psych--  Cognition and judgment appear intact.  Cooperative with normal attention span and concentration.  Behavior appropriate. No anxious or depressed appearing.      Assessment     Assessment   HTN severe CKD: creat ~1.4, 1.5 Dyslipidemia (not checking labs, patient refused medications) Hematology: --ITP-- hematology visit for 11-05-14, released to the care of PCP , check CBCs every 4 months, consult hematology if platelets <50 K --Pernicious anemia --Anemia of chronic disease CV: --CAD --Tako  Tsubo cardiomyopathy --Diastolic  CHF- Dr Missy Sabins --AV block first-degree ; Paroxysmal Afib with RVR/Tachybrady syndrome s/p admission: implanted aSt Jude PPM 11/06/16 DJD: --Compression fractures --Lumbar stenosis Dr. Carloyn Manner --Multiple pelvic fractures , cervical spine compression  fracture 2013 [facility] --H/o nonhealing rib  --Osteoporosis: Dr. Agapito Games; s/p forteo, s/p  Prolia 09-2015 (declined to restart 11/2018). Last DEXA 09-29-16 OPHT: --Glaucoma  --S/p cornea transplant -- mac degeneration h/o granuloma annulare   H/o pseudomembranous colitis 2013  PLAN: Dystrophic toenails: The dystrophic nails are causing pain when she uses a shoe, needs a wider shoe and referred to podiatry.  Generalized joint pain: Please see previous comments under "gout". Today, she reports generalized pain, "every joint hurts" particularly in the hands. Back on 02-2019 was referred to rheumatology.  She reports that she did go but I do not see any records. Subsequently she saw Dr. Amedeo Plenty, she was diagnosed with CTS, generalized arthritis. Currently on Tylenol which does not help much.  Has multiple allergies including oxycodone, tramadol, Plaquenil morphine, etc. Plan: Continue Tylenol, diclofenac topical, get records from rheumatology and consider re referral.Other option includeds Cymbalta, gabapentin CHF: Patient is currently taking torsemide 100 mg twice a day (I was under the impression that she needed 80 mg twice a day), BP is low today in the 80s, 90s, I communicate with Clegg NP from the CHF clinic, she recommended to decrease Coreg to 6.25  mg BID.  I also recommend to check BPs daily and call the CHF clinic.  36 minutes.     This visit occurred during the SARS-CoV-2 public health emergency.  Safety protocols were in place, including screening questions prior to the visit, additional usage of staff PPE, and extensive cleaning of exam room while observing appropriate contact time as indicated for  disinfecting solutions.

## 2019-08-08 NOTE — Progress Notes (Signed)
Pre visit review using our clinic review tool, if applicable. No additional management support is needed unless otherwise documented below in the visit note. 

## 2019-08-08 NOTE — Patient Instructions (Signed)
For pain:  Tylenol 500 mg 2 tablets every 8 hours as needed  Diclofenac topical 3-4 times a day as needed   Please adjust your medication: Decrease carvedilol 12.5 mg to: Half tablet daily twice a day.  Continue monitoring your blood pressure and weight and call the cardiology office within a week and let them know  how your blood pressure and weight are.

## 2019-08-09 ENCOUNTER — Ambulatory Visit (HOSPITAL_COMMUNITY)
Admission: RE | Admit: 2019-08-09 | Discharge: 2019-08-09 | Disposition: A | Discharge status: Home / Self Care | Payer: Medicare Other | Source: Ambulatory Visit | Attending: Internal Medicine | Admitting: Internal Medicine

## 2019-08-09 DIAGNOSIS — I5022 Chronic systolic (congestive) heart failure: Secondary | ICD-10-CM

## 2019-08-09 LAB — BASIC METABOLIC PANEL
Anion gap: 13 (ref 5–15)
BUN: 60 mg/dL — ABNORMAL HIGH (ref 8–23)
CO2: 28 mmol/L (ref 22–32)
Calcium: 9 mg/dL (ref 8.9–10.3)
Chloride: 96 mmol/L — ABNORMAL LOW (ref 98–111)
Creatinine, Ser: 2.51 mg/dL — ABNORMAL HIGH (ref 0.44–1.00)
GFR calc Af Amer: 18 mL/min — ABNORMAL LOW (ref >60)
GFR calc non Af Amer: 16 mL/min — ABNORMAL LOW (ref >60)
Glucose, Bld: 128 mg/dL — ABNORMAL HIGH (ref 70–99)
Potassium: 4 mmol/L (ref 3.5–5.1)
Sodium: 137 mmol/L (ref 135–145)

## 2019-08-09 LAB — CUP PACEART REMOTE DEVICE CHECK
Battery Remaining Longevity: 132 mo
Battery Remaining Percentage: 95.5 %
Battery Voltage: 2.99 V
Brady Statistic RV Percent Paced: 6.9 %
Date Time Interrogation Session: 20210127095725
Implantable Lead Implant Date: 20180427
Implantable Lead Implant Date: 20180427
Implantable Lead Location: 753859
Implantable Lead Location: 753860
Implantable Pulse Generator Implant Date: 20180427
Lead Channel Impedance Value: 510 Ohm
Lead Channel Pacing Threshold Amplitude: 0.5 V
Lead Channel Pacing Threshold Pulse Width: 0.5 ms
Lead Channel Sensing Intrinsic Amplitude: 11.1 mV
Lead Channel Setting Pacing Amplitude: 2.5 V
Lead Channel Setting Pacing Pulse Width: 0.5 ms
Lead Channel Setting Sensing Sensitivity: 2 mV
Pulse Gen Model: 2272
Pulse Gen Serial Number: 8900487

## 2019-08-09 LAB — BRAIN NATRIURETIC PEPTIDE: B Natriuretic Peptide: 259.6 pg/mL — ABNORMAL HIGH (ref 0.0–100.0)

## 2019-08-10 NOTE — Assessment & Plan Note (Signed)
Dystrophic toenails: The dystrophic nails are causing pain when she uses a shoe, needs a wider shoe and referred to podiatry.  Generalized joint pain: Please see previous comments under "gout". Today, she reports generalized pain, "every joint hurts" particularly in the hands. Back on 02-2019 was referred to rheumatology.  She reports that she did go but I do not see any records. Subsequently she saw Dr. Amedeo Plenty, she was diagnosed with CTS, generalized arthritis. Currently on Tylenol which does not help much.  Has multiple allergies including oxycodone, tramadol, Plaquenil morphine, etc. Plan: Continue Tylenol, diclofenac topical, get records from rheumatology and consider re referral.Other option includeds Cymbalta, gabapentin CHF: Patient is currently taking torsemide 100 mg twice a day (I was under the impression that she needed 80 mg twice a day), BP is low today in the 80s, 90s, I communicate with Clegg NP from the CHF clinic, she recommended to decrease Coreg to 6.25  mg BID.  I also recommend to check BPs daily and call the CHF clinic.

## 2019-08-11 DIAGNOSIS — I252 Old myocardial infarction: Secondary | ICD-10-CM | POA: Diagnosis not present

## 2019-08-11 DIAGNOSIS — L89322 Pressure ulcer of left buttock, stage 2: Secondary | ICD-10-CM | POA: Diagnosis not present

## 2019-08-11 DIAGNOSIS — I5033 Acute on chronic diastolic (congestive) heart failure: Secondary | ICD-10-CM | POA: Diagnosis not present

## 2019-08-11 DIAGNOSIS — L89312 Pressure ulcer of right buttock, stage 2: Secondary | ICD-10-CM | POA: Diagnosis not present

## 2019-08-11 DIAGNOSIS — I2510 Atherosclerotic heart disease of native coronary artery without angina pectoris: Secondary | ICD-10-CM | POA: Diagnosis not present

## 2019-08-11 DIAGNOSIS — I110 Hypertensive heart disease with heart failure: Secondary | ICD-10-CM | POA: Diagnosis not present

## 2019-08-14 DIAGNOSIS — L89312 Pressure ulcer of right buttock, stage 2: Secondary | ICD-10-CM | POA: Diagnosis not present

## 2019-08-14 DIAGNOSIS — I2510 Atherosclerotic heart disease of native coronary artery without angina pectoris: Secondary | ICD-10-CM | POA: Diagnosis not present

## 2019-08-14 DIAGNOSIS — I110 Hypertensive heart disease with heart failure: Secondary | ICD-10-CM | POA: Diagnosis not present

## 2019-08-14 DIAGNOSIS — I5033 Acute on chronic diastolic (congestive) heart failure: Secondary | ICD-10-CM | POA: Diagnosis not present

## 2019-08-14 DIAGNOSIS — I252 Old myocardial infarction: Secondary | ICD-10-CM | POA: Diagnosis not present

## 2019-08-14 DIAGNOSIS — L89322 Pressure ulcer of left buttock, stage 2: Secondary | ICD-10-CM | POA: Diagnosis not present

## 2019-08-15 ENCOUNTER — Other Ambulatory Visit: Payer: Self-pay

## 2019-08-15 ENCOUNTER — Ambulatory Visit (INDEPENDENT_AMBULATORY_CARE_PROVIDER_SITE_OTHER): Payer: Medicare Other | Admitting: Podiatry

## 2019-08-15 ENCOUNTER — Encounter: Payer: Self-pay | Admitting: Podiatry

## 2019-08-15 VITALS — BP 118/74 | HR 94 | Resp 16

## 2019-08-15 DIAGNOSIS — I739 Peripheral vascular disease, unspecified: Secondary | ICD-10-CM | POA: Diagnosis not present

## 2019-08-15 DIAGNOSIS — D689 Coagulation defect, unspecified: Secondary | ICD-10-CM | POA: Diagnosis not present

## 2019-08-15 DIAGNOSIS — L97511 Non-pressure chronic ulcer of other part of right foot limited to breakdown of skin: Secondary | ICD-10-CM

## 2019-08-15 DIAGNOSIS — L03031 Cellulitis of right toe: Secondary | ICD-10-CM

## 2019-08-16 ENCOUNTER — Ambulatory Visit (INDEPENDENT_AMBULATORY_CARE_PROVIDER_SITE_OTHER): Payer: Medicare Other

## 2019-08-16 DIAGNOSIS — I4891 Unspecified atrial fibrillation: Secondary | ICD-10-CM | POA: Diagnosis not present

## 2019-08-16 DIAGNOSIS — Z5181 Encounter for therapeutic drug level monitoring: Secondary | ICD-10-CM

## 2019-08-16 LAB — POCT INR: INR: 1.9 — AB (ref 2.0–3.0)

## 2019-08-16 NOTE — Progress Notes (Signed)
Subjective:  Patient ID: [name], female    DOB: 1922-07-30,  MRN: [id number] HPI Chief Complaint  Patient presents with  . Toe Pain    Hallux right - tender around toenail, can't cut the nails herself, 2nd toe(tip) right - small wound  . New Patient (Initial Visit)    86 y.o. female presents with the above complaint.   ROS: Denies fever chills nausea vomiting muscle aches pains calf pain back pain chest pain shortness of breath.  Past Medical History:  Diagnosis Date  . AV block, 1st degree   . CAD (coronary artery disease)    Non ST elevation 2005 ; LHC in 6/05 in setting of NSTEMI: ant apical AK and inf-apical AK, EF 29%, dOM2 70-80% (small); findings c/w Tako-Tsubo CM  . Cardiomyopathy, nonischemic ([place]) 04/2008   Likely Tako-Tsubo. Resolved. --dx'd on cath 2005. EF 29% with minimal distal CAD (small OM1 70-80).  Echo 10/09 EF 55%;  echo 01/26/12: EF 55%, mild LAE, PASP 34.  Marland Kitchen CHB (complete heart block) (HCC)    Intermittent, s/p Saint Jude PPM  . Colitis, ischemic ([place])   . Complication of anesthesia    post anesthesia excessive somnulence  . Compression fracture of C-spine ([place]) 10/10/2011   Fell at home, tx at [facility]  . Diarrhea associated with pseudomembranous colitis 6/11-17/2013   [facility]  . Dyslipidemia   . Fall    with non-healing rib fractures  . Glaucoma   . Granuloma annulare   . History of phlebitis   . Hypertension    SEVERE  . Idiopathic thrombocytopenic purpura (ITP) (HCC)   . Lower extremity edema   . Lumbar stenosis 2004   DR. MARK ROY  . Multiple pelvic fractures ([place]) 05/2012   Dr Adaline Sill , [facility]  . Osteoporosis    Past Surgical History:  Procedure Laterality Date  . ABDOMINAL HYSTERECTOMY    . APPENDECTOMY    . BACK SURGERY  10/2004   LS Disc 4-5 no sx.  Marland Kitchen CARDIAC CATHETERIZATION     12/2003  . CATARACT EXTRACTION, BILATERAL    . COLONOSCOPY  09/2005   adenoma  . COLONOSCOPY W/ POLYPECTOMY    . CORNEAL TRANSPLANT   11-27-02   left eye  . PACEMAKER IMPLANT N/A 11/06/2016   Procedure: St Jude Pacemaker Implant;  Surgeon: Evans Lance, MD;  Location: [facility];  Service: Cardiovascular;  Laterality: N/A;  . rectal bleed  05-01-06   colonoscopy-ischemic colitis  . ROTATOR CUFF REPAIR    . VERTEBROPLASTY     Dr Gladstone Lighter    Current Outpatient Medications:  .  beta carotene w/minerals (OCUVITE) tablet, Take 1 tablet by mouth daily. , Disp: , Rfl:  .  brimonidine (ALPHAGAN) 0.2 % ophthalmic solution, Place 1 drop into the right eye 2 (two) times daily.  , Disp: , Rfl:  .  Calcium Carbonate-Vit D-Min (CALCIUM 1200 PO), Take 1,200 mg by mouth daily with breakfast., Disp: , Rfl:  .  carboxymethylcellulose (REFRESH TEARS) 0.5 % SOLN, Place 1 drop into both eyes 2 (two) times daily. , Disp: , Rfl:  .  carvedilol (COREG) 12.5 MG tablet, Take 0.5 tablets (6.25 mg total) by mouth 2 (two) times daily with a meal., Disp: , Rfl:  .  Cholecalciferol (VITAMIN D3) 3000 UNITS TABS, Take 3,000 Units by mouth daily. , Disp: , Rfl:  .  diclofenac Sodium (VOLTAREN) 1 % GEL, Apply 2 g topically 4 (four) times daily as needed., Disp: 150  g, Rfl: 1 .  diphenhydrAMINE (BENADRYL) 25 MG tablet, Take 25 mg by mouth at bedtime as needed for sleep (and sinus issues). , Disp: , Rfl:  .  docusate sodium (COLACE) 100 MG capsule, Take 100 mg by mouth daily as needed for mild constipation. Stool softener , Disp: , Rfl:  .  Misc Natural Products (OSTEO BI-FLEX ADV JOINT SHIELD) TABS, Take 1 tablet by mouth daily. , Disp: , Rfl:  .  omeprazole (PRILOSEC) 20 MG capsule, Take 20 mg by mouth daily.  , Disp: , Rfl:  .  potassium chloride SA (KLOR-CON) 20 MEQ tablet, Take 1 tablet (20 mEq total) by mouth 2 (two) times daily., Disp: , Rfl:  .  timolol (TIMOPTIC) 0.5 % ophthalmic solution, Place 1 drop into the right eye 2 (two) times daily., Disp: , Rfl: 5 .  torsemide (DEMADEX) 20 MG tablet, Take 4 tablets (80 mg total) by mouth 2 (two)  times daily. May take 1 extra tab for weight gain of 3lbs in 24 hours (Patient taking differently: Take 5 tabs in AM and 5 tabs in PM.), Disp: 240 tablet, Rfl: 5 .  vitamin B-12 (CYANOCOBALAMIN) 1000 MCG tablet, Take 1,000 mcg by mouth 2 (two) times daily. , Disp: , Rfl:  .  warfarin (COUMADIN) 3 MG tablet, Take 1 tablet (3 mg total) by mouth daily., Disp: 30 tablet, Rfl: 11  Allergies  Allergen Reactions  . Morphine And Related Anaphylaxis and Nausea Only    Per family, made her violently ill  . Oxycodone Other (See Comments)    Dizziness   . Penicillins Swelling and Other (See Comments)    Joint edema Has patient had a PCN reaction causing immediate rash, facial/tongue/throat swelling, SOB or lightheadedness with hypotension: Yes Has patient had a PCN reaction causing severe rash involving mucus membranes or skin necrosis: No Has patient had a PCN reaction that required hospitalization: No Has patient had a PCN reaction occurring within the last 10 years: No If all of the above answers are "NO", then may proceed with Cephalosporin use.   . Clindamycin/Lincomycin Hives and Itching  . Colchicine Other (See Comments)    Hair loss  . Norvasc [Amlodipine Besylate] Other (See Comments)    Reaction not recalled by the patient ??  . Ofloxacin Other (See Comments)    Unknown   . Prednisone Other (See Comments)    Blurred vision  . Streptomycin Other (See Comments)    "was a long time ago" reaction not recalled  . Tape Other (See Comments)    SKIN IS VERY THIN AND WILL TEAR AND BRUISE EASILY!!  . Tramadol Itching  . Alendronate Sodium Palpitations  . Amiodarone Rash and Other (See Comments)    Possible rash and nervousness per patient  . Plaquenil [Hydroxychloroquine Sulfate] Rash   Review of Systems Objective:   Vitals:   08/15/19 1457  BP: 118/74  Pulse: 94  Resp: 16    General: Well developed, nourished, in no acute distress, alert and oriented x3   Dermatological: Skin  is warm, dry and supple bilateral. Nails x 10 are well maintained; remaining integument appears unremarkable at this time. There are no open sores, no preulcerative lesions, no rash or signs of infection present.  Toenails are long thick yellow dystrophic clinically mycotic.  Sharply incurvated nail margin tibial border hallux right once debrided demonstrates a small pocket of purulence which was cleaned out thoroughly today did not probe to bone.  Small superficial preulcerative lesion distal  aspect of hammertoe second right currently noncomplicated  Vascular: Dorsalis Pedis artery and Posterior Tibial artery pedal pulses are 2/4 bilateral with immedate capillary fill time. Pedal hair growth present. No varicosities and no lower extremity edema present bilateral.   Neruologic: Grossly intact via light touch bilateral. Vibratory intact via tuning fork bilateral. Protective threshold with Semmes Wienstein monofilament intact to all pedal sites bilateral. Patellar and Achilles deep tendon reflexes 2+ bilateral. No Babinski or clonus noted bilateral.   Musculoskeletal: No gross boney pedal deformities bilateral.  Other than hammertoe deformities.  No pain, crepitus, or limitation noted with foot and ankle range of motion bilateral. Muscular strength 5/5 in all groups tested bilateral.  Gait: Unassisted, Nonantalgic.    Radiographs:  None taken  Assessment & Plan:   Assessment: Hammertoes bilateral.  Distal clavus second digit right foot early preulcerative lesion.  Peripheral vascular disease both arterial and venous paronychia ingrown nail hallux right.  Pain in limb secondary to onychomycosis bilateral.    Plan: Discussed etiology pathology conservative versus surgical therapies.  At this point I feel is the necessity to have her arterial flow checked it is obvious that she has venous congestion.  Preulcerative lesion to the second digit of the right foot most likely associated with arterial  insufficiency.  Also placed a buttress pad beneath the toes to keep the toes off the floor to help prevent breakdown.  I debrided her nails and also performed today's I&D to the tibial border of the hallux right the demonstrate a small abscess which did not probe deep to bone but did probe into the soft tissues.  This was done without anesthetic and she tolerated the procedure well.  She and her son were instructed on soaking this Epson salts and warm water and we will follow-up with her for routine nail care and once the vascular evaluation is complete.     Gerado Nabers T. [place], [place]

## 2019-08-16 NOTE — Patient Instructions (Signed)
Description   Take 1 tablet tomorrow, then resume same dosage 1/2 a tablet daily, except for 1 tablet on Mondays, Wednesdays and Fridays. Continue your protein drinks 1-2 a week. Recheck INR in 3 weeks. Call the coumadin Clinic if for any changes in medication or upcoming procedures. [facility]in Clinic [phone] Main [phone]

## 2019-08-18 ENCOUNTER — Telehealth (HOSPITAL_COMMUNITY): Payer: Self-pay

## 2019-08-18 DIAGNOSIS — I252 Old myocardial infarction: Secondary | ICD-10-CM | POA: Diagnosis not present

## 2019-08-18 DIAGNOSIS — L89312 Pressure ulcer of right buttock, stage 2: Secondary | ICD-10-CM | POA: Diagnosis not present

## 2019-08-18 DIAGNOSIS — I2510 Atherosclerotic heart disease of native coronary artery without angina pectoris: Secondary | ICD-10-CM | POA: Diagnosis not present

## 2019-08-18 DIAGNOSIS — I5033 Acute on chronic diastolic (congestive) heart failure: Secondary | ICD-10-CM | POA: Diagnosis not present

## 2019-08-18 DIAGNOSIS — I110 Hypertensive heart disease with heart failure: Secondary | ICD-10-CM | POA: Diagnosis not present

## 2019-08-18 DIAGNOSIS — L89322 Pressure ulcer of left buttock, stage 2: Secondary | ICD-10-CM | POA: Diagnosis not present

## 2019-08-18 NOTE — Telephone Encounter (Signed)

## 2019-08-21 ENCOUNTER — Other Ambulatory Visit: Payer: Self-pay

## 2019-08-21 ENCOUNTER — Ambulatory Visit (HOSPITAL_COMMUNITY)
Admission: RE | Admit: 2019-08-21 | Discharge: 2019-08-21 | Disposition: A | Discharge status: Home / Self Care | Payer: Medicare Other | Source: Ambulatory Visit | Attending: Podiatry | Admitting: Podiatry

## 2019-08-21 DIAGNOSIS — L97511 Non-pressure chronic ulcer of other part of right foot limited to breakdown of skin: Secondary | ICD-10-CM | POA: Diagnosis not present

## 2019-08-21 DIAGNOSIS — I739 Peripheral vascular disease, unspecified: Secondary | ICD-10-CM | POA: Insufficient documentation

## 2019-08-22 DIAGNOSIS — L89322 Pressure ulcer of left buttock, stage 2: Secondary | ICD-10-CM | POA: Diagnosis not present

## 2019-08-22 DIAGNOSIS — I5033 Acute on chronic diastolic (congestive) heart failure: Secondary | ICD-10-CM | POA: Diagnosis not present

## 2019-08-22 DIAGNOSIS — L89312 Pressure ulcer of right buttock, stage 2: Secondary | ICD-10-CM | POA: Diagnosis not present

## 2019-08-22 DIAGNOSIS — I110 Hypertensive heart disease with heart failure: Secondary | ICD-10-CM | POA: Diagnosis not present

## 2019-08-22 DIAGNOSIS — I252 Old myocardial infarction: Secondary | ICD-10-CM | POA: Diagnosis not present

## 2019-08-22 DIAGNOSIS — I2510 Atherosclerotic heart disease of native coronary artery without angina pectoris: Secondary | ICD-10-CM | POA: Diagnosis not present

## 2019-08-24 DIAGNOSIS — I252 Old myocardial infarction: Secondary | ICD-10-CM | POA: Diagnosis not present

## 2019-08-24 DIAGNOSIS — I5033 Acute on chronic diastolic (congestive) heart failure: Secondary | ICD-10-CM | POA: Diagnosis not present

## 2019-08-24 DIAGNOSIS — I110 Hypertensive heart disease with heart failure: Secondary | ICD-10-CM | POA: Diagnosis not present

## 2019-08-24 DIAGNOSIS — L89312 Pressure ulcer of right buttock, stage 2: Secondary | ICD-10-CM | POA: Diagnosis not present

## 2019-08-24 DIAGNOSIS — L89322 Pressure ulcer of left buttock, stage 2: Secondary | ICD-10-CM | POA: Diagnosis not present

## 2019-08-24 DIAGNOSIS — I2510 Atherosclerotic heart disease of native coronary artery without angina pectoris: Secondary | ICD-10-CM | POA: Diagnosis not present

## 2019-08-28 DIAGNOSIS — L89312 Pressure ulcer of right buttock, stage 2: Secondary | ICD-10-CM | POA: Diagnosis not present

## 2019-08-28 DIAGNOSIS — L89322 Pressure ulcer of left buttock, stage 2: Secondary | ICD-10-CM | POA: Diagnosis not present

## 2019-08-28 DIAGNOSIS — I252 Old myocardial infarction: Secondary | ICD-10-CM | POA: Diagnosis not present

## 2019-08-28 DIAGNOSIS — I110 Hypertensive heart disease with heart failure: Secondary | ICD-10-CM | POA: Diagnosis not present

## 2019-08-28 DIAGNOSIS — I5033 Acute on chronic diastolic (congestive) heart failure: Secondary | ICD-10-CM | POA: Diagnosis not present

## 2019-08-28 DIAGNOSIS — I2510 Atherosclerotic heart disease of native coronary artery without angina pectoris: Secondary | ICD-10-CM | POA: Diagnosis not present

## 2019-08-31 ENCOUNTER — Encounter (HOSPITAL_COMMUNITY): Payer: Medicare Other | Admitting: Internal Medicine

## 2019-09-01 DIAGNOSIS — I5033 Acute on chronic diastolic (congestive) heart failure: Secondary | ICD-10-CM | POA: Diagnosis not present

## 2019-09-01 DIAGNOSIS — I110 Hypertensive heart disease with heart failure: Secondary | ICD-10-CM | POA: Diagnosis not present

## 2019-09-01 DIAGNOSIS — I2510 Atherosclerotic heart disease of native coronary artery without angina pectoris: Secondary | ICD-10-CM | POA: Diagnosis not present

## 2019-09-01 DIAGNOSIS — L89312 Pressure ulcer of right buttock, stage 2: Secondary | ICD-10-CM | POA: Diagnosis not present

## 2019-09-01 DIAGNOSIS — L89322 Pressure ulcer of left buttock, stage 2: Secondary | ICD-10-CM | POA: Diagnosis not present

## 2019-09-01 DIAGNOSIS — I252 Old myocardial infarction: Secondary | ICD-10-CM | POA: Diagnosis not present

## 2019-09-04 DIAGNOSIS — I5033 Acute on chronic diastolic (congestive) heart failure: Secondary | ICD-10-CM | POA: Diagnosis not present

## 2019-09-04 DIAGNOSIS — L89322 Pressure ulcer of left buttock, stage 2: Secondary | ICD-10-CM | POA: Diagnosis not present

## 2019-09-04 DIAGNOSIS — L89312 Pressure ulcer of right buttock, stage 2: Secondary | ICD-10-CM | POA: Diagnosis not present

## 2019-09-04 DIAGNOSIS — I252 Old myocardial infarction: Secondary | ICD-10-CM | POA: Diagnosis not present

## 2019-09-04 DIAGNOSIS — I110 Hypertensive heart disease with heart failure: Secondary | ICD-10-CM | POA: Diagnosis not present

## 2019-09-04 DIAGNOSIS — I2510 Atherosclerotic heart disease of native coronary artery without angina pectoris: Secondary | ICD-10-CM | POA: Diagnosis not present

## 2019-09-05 ENCOUNTER — Ambulatory Visit (HOSPITAL_BASED_OUTPATIENT_CLINIC_OR_DEPARTMENT_OTHER)
Admission: RE | Admit: 2019-09-05 | Discharge: 2019-09-05 | Disposition: A | Discharge status: Home / Self Care | Payer: Medicare Other | Source: Ambulatory Visit | Attending: Internal Medicine | Admitting: Internal Medicine

## 2019-09-05 ENCOUNTER — Other Ambulatory Visit: Payer: Self-pay

## 2019-09-05 VITALS — BP 107/72 | HR 92 | Wt 150.0 lb

## 2019-09-05 DIAGNOSIS — I252 Old myocardial infarction: Secondary | ICD-10-CM | POA: Insufficient documentation

## 2019-09-05 DIAGNOSIS — I5032 Chronic diastolic (congestive) heart failure: Secondary | ICD-10-CM | POA: Diagnosis not present

## 2019-09-05 DIAGNOSIS — I4811 Longstanding persistent atrial fibrillation: Secondary | ICD-10-CM

## 2019-09-05 DIAGNOSIS — N184 Chronic kidney disease, stage 4 (severe): Secondary | ICD-10-CM | POA: Diagnosis not present

## 2019-09-05 DIAGNOSIS — I495 Sick sinus syndrome: Secondary | ICD-10-CM | POA: Insufficient documentation

## 2019-09-05 DIAGNOSIS — I110 Hypertensive heart disease with heart failure: Secondary | ICD-10-CM | POA: Insufficient documentation

## 2019-09-05 DIAGNOSIS — I442 Atrioventricular block, complete: Secondary | ICD-10-CM | POA: Diagnosis not present

## 2019-09-05 DIAGNOSIS — I132 Hypertensive heart and chronic kidney disease with heart failure and with stage 5 chronic kidney disease, or end stage renal disease: Secondary | ICD-10-CM | POA: Diagnosis not present

## 2019-09-05 DIAGNOSIS — J984 Other disorders of lung: Secondary | ICD-10-CM | POA: Diagnosis not present

## 2019-09-05 DIAGNOSIS — Z79899 Other long term (current) drug therapy: Secondary | ICD-10-CM | POA: Insufficient documentation

## 2019-09-05 DIAGNOSIS — I5033 Acute on chronic diastolic (congestive) heart failure: Secondary | ICD-10-CM | POA: Insufficient documentation

## 2019-09-05 DIAGNOSIS — Z20822 Contact with and (suspected) exposure to covid-19: Secondary | ICD-10-CM | POA: Diagnosis not present

## 2019-09-05 DIAGNOSIS — N183 Chronic kidney disease, stage 3 unspecified: Secondary | ICD-10-CM

## 2019-09-05 DIAGNOSIS — I2510 Atherosclerotic heart disease of native coronary artery without angina pectoris: Secondary | ICD-10-CM | POA: Insufficient documentation

## 2019-09-05 DIAGNOSIS — Z8249 Family history of ischemic heart disease and other diseases of the circulatory system: Secondary | ICD-10-CM | POA: Insufficient documentation

## 2019-09-05 DIAGNOSIS — H409 Unspecified glaucoma: Secondary | ICD-10-CM | POA: Insufficient documentation

## 2019-09-05 DIAGNOSIS — R600 Localized edema: Secondary | ICD-10-CM | POA: Diagnosis not present

## 2019-09-05 DIAGNOSIS — Z7901 Long term (current) use of anticoagulants: Secondary | ICD-10-CM

## 2019-09-05 DIAGNOSIS — E785 Hyperlipidemia, unspecified: Secondary | ICD-10-CM | POA: Insufficient documentation

## 2019-09-05 DIAGNOSIS — M810 Age-related osteoporosis without current pathological fracture: Secondary | ICD-10-CM | POA: Insufficient documentation

## 2019-09-05 DIAGNOSIS — R0602 Shortness of breath: Secondary | ICD-10-CM | POA: Insufficient documentation

## 2019-09-05 DIAGNOSIS — Z950 Presence of cardiac pacemaker: Secondary | ICD-10-CM | POA: Insufficient documentation

## 2019-09-05 DIAGNOSIS — D693 Immune thrombocytopenic purpura: Secondary | ICD-10-CM | POA: Insufficient documentation

## 2019-09-05 DIAGNOSIS — I509 Heart failure, unspecified: Secondary | ICD-10-CM | POA: Diagnosis not present

## 2019-09-05 DIAGNOSIS — I4821 Permanent atrial fibrillation: Secondary | ICD-10-CM | POA: Insufficient documentation

## 2019-09-05 LAB — BASIC METABOLIC PANEL
Anion gap: 15 (ref 5–15)
BUN: 73 mg/dL — ABNORMAL HIGH (ref 8–23)
CO2: 23 mmol/L (ref 22–32)
Calcium: 9.5 mg/dL (ref 8.9–10.3)
Chloride: 98 mmol/L (ref 98–111)
Creatinine, Ser: 2.76 mg/dL — ABNORMAL HIGH (ref 0.44–1.00)
GFR calc Af Amer: 16 mL/min — ABNORMAL LOW (ref >60)
GFR calc non Af Amer: 14 mL/min — ABNORMAL LOW (ref >60)
Glucose, Bld: 98 mg/dL (ref 70–99)
Potassium: 4.8 mmol/L (ref 3.5–5.1)
Sodium: 136 mmol/L (ref 135–145)

## 2019-09-05 LAB — PROTIME-INR
INR: 4.2 (ref 0.8–1.2)
Prothrombin Time: 40.8 seconds — ABNORMAL HIGH (ref 11.4–15.2)

## 2019-09-05 LAB — BRAIN NATRIURETIC PEPTIDE: B Natriuretic Peptide: 374 pg/mL — ABNORMAL HIGH (ref 0.0–100.0)

## 2019-09-05 NOTE — Progress Notes (Signed)
Date:  09/05/2019   ID:  [name], DOB 1922/10/01, MRN [id number]  Location: Home  Provider location: [name] Type of Visit: Established patient  PCP:  Daisy Branch, MD  Cardiologist:  Daisy Bickers, MD Primary HF: [name] EP: [name] and AFib Fry  Chief Complaint: Heart Failure follow-up   History of Present Illness: [name] is a 86 y.o. 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 LHC in 6/05 in setting of NSTEMI: ant apical AK and inf-apical AK, EF 29%, dOM2 70-80% (small). EF has recovered. Also has a h/o HTN, permanent AF with tachy-brady syndrome s/p Fry and fractured pelvis 05/2012.  Admitted 4/27-4/28/18 for evaluation of tachycardia. Had recent holter monitor that showed 6.5 second pause. Coreg stopped and referred to EP. She then developed Afib RVR. With pause, Fry was placed 11/06/16 by Dr. Lovena Fry.  Noted to be in Afib with controlled ventricular rate on discharge.   Seen recently on 10/19 and again 10/26 for volume overload w/ subsequent up titration of diuretic regimen. On 10/26,  Torsemide was further increased to 80 mg bid. Also takes metolazone 4 days a week. Home health was also ordered to assist w/ wrapping of legs to help w/ edema.  Seen back in Fry on 06/26/19. Weight decreased from 148--->141 lb.  She was felt to be hypovolemic from over diuresis.  Her meds were adjusted. Metolazone was discontinued and she was instructed to hold torsemide x 2 days then resume 80 mg bid. Also instructed to reduce coreg down to 12.5 mg bid.   Today she returns for HF follow up. Having ongoing issues with volume overload. She has been taking torsemide 100 mg twice a day with poor results. She has had ongoing leg edema. Overall feeling terrible. SOB with exertion.  Denies PND/Orthopnea. Appetite ok. She has trouble  preparing her meals. She has been eating beanie weenies and fritos but says this is not  daily.  Intolerant leg wraps. No fever or chills. Weight at home 149-155 pounds. Taking all medications. She lives alone but has a very supportive son. Followed by [name] one-two times per week.   Cardiac Studies 01/2012 ECHO EF 55%  08/14/13 ECHO EF 55%, normal RV size and systolic function.   12/19 Echo EF 55-60%  [name] denies symptoms worrisome for COVID 19.   Past Medical History:  Diagnosis Date  . AV block, 1st degree   . CAD (coronary artery disease)    Non Daisy elevation 2005 ; LHC in 6/05 in setting of NSTEMI: ant apical AK and inf-apical AK, EF 29%, dOM2 70-80% (small); findings c/w Tako-Tsubo CM  . Cardiomyopathy, nonischemic ([name]) 04/2008   Likely Tako-Tsubo. Resolved. --dx'd on cath 2005. EF 29% with minimal distal CAD (small OM1 70-80).  Echo 10/09 EF 55%;  echo 01/26/12: EF 55%, mild LAE, PASP 34.  Marland Kitchen CHB (complete heart block) (HCC)    Intermittent, s/p [name]  . Colitis, ischemic ([name])   . Complication of anesthesia    post anesthesia excessive somnulence  . Compression fracture of C-spine ([name]) 10/10/2011   Fell at home, tx at [name]  . Diarrhea associated with pseudomembranous colitis 6/11-17/2013   [name]  . Dyslipidemia   . Fall    with non-healing rib fractures  . Glaucoma   . Granuloma annulare   . History of phlebitis   . Hypertension    SEVERE  .  Idiopathic thrombocytopenic purpura (ITP) (HCC)   . Lower extremity edema   . Lumbar stenosis 2004   [name]  . Multiple pelvic fractures ([name]) 05/2012   Dr [name] , [name]  . Osteoporosis    Past Surgical History:  Procedure Laterality Date  . ABDOMINAL HYSTERECTOMY    . APPENDECTOMY    . BACK SURGERY  10/2004   LS Disc 4-5 no sx.  Marland Kitchen CARDIAC CATHETERIZATION     12/2003  . CATARACT EXTRACTION, BILATERAL    . COLONOSCOPY  09/2005   adenoma  . COLONOSCOPY W/ POLYPECTOMY    . 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 PACEMAKER IMPLANT N/A 11/06/2016   Procedure: Daisy Jude  Pacemaker Implant;  Surgeon: Daisy Lance, MD;  Location: [name];  Service: Cardiovascular;  Laterality: N/A;  . rectal bleed  05-01-06   colonoscopy-ischemic colitis  . ROTATOR CUFF REPAIR    . VERTEBROPLASTY     Dr Gladstone Lighter     Current Outpatient Medications  Medication Sig Dispense Refill  . beta carotene w/minerals (OCUVITE) tablet Take 1 tablet by mouth daily.     . brimonidine (ALPHAGAN) 0.2 % ophthalmic solution Place 1 drop into the right eye 2 (two) times daily.      . Calcium Carbonate-Vit D-Min (CALCIUM 1200 PO) Take 1,200 mg by mouth daily with breakfast.    . carboxymethylcellulose (REFRESH TEARS) 0.5 % SOLN Place 1 drop into both eyes 2 (two) times daily.     . carvedilol (COREG) 12.5 MG tablet Take 0.5 tablets (6.25 mg total) by mouth 2 (two) times daily with a meal.    . Cholecalciferol (VITAMIN D3) 3000 UNITS TABS Take 3,000 Units by mouth daily.     . diclofenac Sodium (VOLTAREN) 1 % GEL Apply 2 g topically 4 (four) times daily as needed. 150 g 1  . diphenhydrAMINE (BENADRYL) 25 MG tablet Take 25 mg by mouth at bedtime as needed for sleep (and sinus issues).     . docusate sodium (COLACE) 100 MG capsule Take 100 mg by mouth daily as needed for mild constipation. Stool softener     . Misc Natural Products (OSTEO BI-FLEX ADV JOINT SHIELD) TABS Take 1 tablet by mouth daily.     Marland Kitchen omeprazole (PRILOSEC) 20 MG capsule Take 20 mg by mouth daily.      . potassium chloride SA (KLOR-CON) 20 MEQ tablet Take 1 tablet (20 mEq total) by mouth 2 (two) times daily.    . timolol (TIMOPTIC) 0.5 % ophthalmic solution Place 1 drop into the right eye 2 (two) times daily.  5  . torsemide (DEMADEX) 20 MG tablet Take 4 tablets (80 mg total) by mouth 2 (two) times daily. May take 1 extra tab for weight gain of 3lbs in 24 hours (Patient taking differently: Take 5 tabs in AM and 5 tabs in PM.) 240 tablet 5  . vitamin B-12 (CYANOCOBALAMIN) 1000 MCG tablet Take 1,000 mcg by mouth 2 (two)  times daily.     Marland Kitchen warfarin (COUMADIN) 3 MG tablet Take 1 tablet (3 mg total) by mouth daily. 30 tablet 11   No current facility-administered medications for this encounter.    Allergies:   Morphine and related, Oxycodone, Penicillins, Clindamycin/lincomycin, Colchicine, Norvasc [amlodipine besylate], Ofloxacin, Prednisone, Streptomycin, Tape, Tramadol, Alendronate sodium, Amiodarone, and Plaquenil [hydroxychloroquine sulfate]   Social History:  The patient  reports that she has never smoked. She has never used smokeless tobacco. She reports that  she does not drink alcohol or use drugs.   Family History:  The patient's family history includes Breast cancer in her sister and sister; Daisy cancer in her father; Coronary artery disease in her mother; Heart failure in her mother; Kidney disease in her mother; Rectal cancer in her sister; Renal cancer in her mother; Stroke in her father.   ROS:  Please see the history of present illness.   All other systems are personally reviewed and negative.  Vitals:   09/05/19 1542  BP: 107/72  Pulse: 92  SpO2: 96%   Wt Readings from Last 3 Encounters:  09/05/19 68 kg (150 lb)  07/20/19 68.7 kg (151 lb 6.4 oz)  06/26/19 65.5 kg (144 lb 6.4 oz)    PHYSICAL EXAM: General:  Arrived in a wheel chair. Appears chronically ill. No resp difficulty HEENT: normal Neck: supple. JVP to jaw . Carotids 2+ bilat; no bruits. No lymphadenopathy or thryomegaly appreciated. Cor: PMI nondisplaced. Irregular rate & rhythm. No rubs, gallops or murmurs. Lungs: clear Abdomen: soft, nontender, nondistended. No hepatosplenomegaly. No bruits or masses. Good bowel sounds. Extremities: no cyanosis, clubbing, rash, R and LLE 3+ edema Neuro: alert & orientedx3, cranial nerves grossly intact. moves all 4 extremities w/o difficulty. Affect pleasant  Recent Labs: 03/01/2019: Pro B Natriuretic peptide (BNP) 479.0 04/25/2019: Magnesium 1.4 07/20/2019: Hemoglobin 10.8; Platelets  66 08/09/2019: B Natriuretic Peptide 259.6; BUN 60; Creatinine, Ser 2.51; Potassium 4.0; Sodium 137   ASSESSMENT AND PLAN:  1. Acute on Chronic diastolic CHF: History of Takotsubo cardiomyopathy, EF has recovered. - Echo 05/2019 EF 55-60%  - R>>L HF symptoms  - NYHA III. Marked volume overload. Will need to give 80 mg IV lasix for the next 2 days then restart torsemide 100 mg twice a day.  - Continue carvedilol to 6.25 mg twice a day.  - Check BMET and pro BNP today.   2. CAD:  - Minimal Fry vessel disease on cath -No chest pain - Off ASA in setting of warfarin. Has refused statins.  3. Permanent AF with Tachybrady syndrome s/p Daisy Jude Fry 11/06/16 -Rate controlled.  - on warfarin.  - Check INR today  4.  HTN   -Stable.  - continue current regimen    Follow up next week to reassess volume status. Refer to [facility] for IV lasix x2 days. Needs close follow up to assess response. Discussed low salt food choices and limiting fluid intake.   Needs Personal Care services.   Jeanmarie Hubert, NP  09/05/2019 3:44 PM  [facility] [address] [facility] [place] [address] [phone] (office) [phone] (fax)

## 2019-09-05 NOTE — Patient Instructions (Signed)
Lab work done today. We will notify you of any abnormal lab work. No news is good news!  HOLD Torsemide Wednesday and Thursday. RESTART Friday.  Please follow up weekly for 3 weeks with the [facility].  At the [facility], you and your health needs are our priority. As part of our continuing mission to provide you with exceptional heart care, we have created designated Provider Care Teams. These Care Teams include your primary Cardiologist (physician) and Advanced Practice Providers (APPs- Physician Assistants and Nurse Practitioners) who all work together to provide you with the care you need, when you need it.   You may see any of the following providers on your designated Care Team at your next follow up: Marland Kitchen Dr Glori Bickers . Dr Loralie Champagne . Darrick Grinder, NP . Lyda Jester, PA . Audry Riles, PharmD   Please be sure to bring in all your medications bottles to every appointment.

## 2019-09-06 ENCOUNTER — Encounter (HOSPITAL_COMMUNITY): Payer: Self-pay

## 2019-09-06 ENCOUNTER — Inpatient Hospital Stay (HOSPITAL_COMMUNITY)
Admission: EM | Admit: 2019-09-06 | Discharge: 2019-09-12 | DRG: 291 | Disposition: A | Discharge status: Skilled Nursing Facility | Payer: Medicare Other | Attending: Internal Medicine | Admitting: Internal Medicine

## 2019-09-06 ENCOUNTER — Inpatient Hospital Stay (HOSPITAL_COMMUNITY): Payer: Medicare Other

## 2019-09-06 ENCOUNTER — Ambulatory Visit (INDEPENDENT_AMBULATORY_CARE_PROVIDER_SITE_OTHER): Payer: Medicare Other

## 2019-09-06 ENCOUNTER — Other Ambulatory Visit: Payer: Self-pay

## 2019-09-06 ENCOUNTER — Emergency Department (HOSPITAL_COMMUNITY): Payer: Medicare Other

## 2019-09-06 DIAGNOSIS — Z880 Allergy status to penicillin: Secondary | ICD-10-CM | POA: Diagnosis not present

## 2019-09-06 DIAGNOSIS — K761 Chronic passive congestion of liver: Secondary | ICD-10-CM | POA: Diagnosis present

## 2019-09-06 DIAGNOSIS — I428 Other cardiomyopathies: Secondary | ICD-10-CM | POA: Diagnosis present

## 2019-09-06 DIAGNOSIS — I441 Atrioventricular block, second degree: Secondary | ICD-10-CM | POA: Diagnosis not present

## 2019-09-06 DIAGNOSIS — Z5181 Encounter for therapeutic drug level monitoring: Secondary | ICD-10-CM | POA: Diagnosis not present

## 2019-09-06 DIAGNOSIS — I2510 Atherosclerotic heart disease of native coronary artery without angina pectoris: Secondary | ICD-10-CM | POA: Diagnosis present

## 2019-09-06 DIAGNOSIS — H409 Unspecified glaucoma: Secondary | ICD-10-CM | POA: Diagnosis present

## 2019-09-06 DIAGNOSIS — Z8249 Family history of ischemic heart disease and other diseases of the circulatory system: Secondary | ICD-10-CM | POA: Diagnosis not present

## 2019-09-06 DIAGNOSIS — E785 Hyperlipidemia, unspecified: Secondary | ICD-10-CM | POA: Diagnosis present

## 2019-09-06 DIAGNOSIS — N183 Chronic kidney disease, stage 3 unspecified: Secondary | ICD-10-CM | POA: Diagnosis not present

## 2019-09-06 DIAGNOSIS — R791 Abnormal coagulation profile: Secondary | ICD-10-CM | POA: Diagnosis present

## 2019-09-06 DIAGNOSIS — Z9842 Cataract extraction status, left eye: Secondary | ICD-10-CM

## 2019-09-06 DIAGNOSIS — M2550 Pain in unspecified joint: Secondary | ICD-10-CM | POA: Diagnosis not present

## 2019-09-06 DIAGNOSIS — Z800 Family history of malignant neoplasm of digestive organs: Secondary | ICD-10-CM

## 2019-09-06 DIAGNOSIS — Z7401 Bed confinement status: Secondary | ICD-10-CM | POA: Diagnosis not present

## 2019-09-06 DIAGNOSIS — I4811 Longstanding persistent atrial fibrillation: Secondary | ICD-10-CM | POA: Diagnosis not present

## 2019-09-06 DIAGNOSIS — Z885 Allergy status to narcotic agent status: Secondary | ICD-10-CM | POA: Diagnosis not present

## 2019-09-06 DIAGNOSIS — I495 Sick sinus syndrome: Secondary | ICD-10-CM | POA: Diagnosis present

## 2019-09-06 DIAGNOSIS — D631 Anemia in chronic kidney disease: Secondary | ICD-10-CM | POA: Diagnosis present

## 2019-09-06 DIAGNOSIS — I4821 Permanent atrial fibrillation: Secondary | ICD-10-CM

## 2019-09-06 DIAGNOSIS — Z20822 Contact with and (suspected) exposure to covid-19: Secondary | ICD-10-CM | POA: Diagnosis present

## 2019-09-06 DIAGNOSIS — I429 Cardiomyopathy, unspecified: Secondary | ICD-10-CM | POA: Diagnosis not present

## 2019-09-06 DIAGNOSIS — Z66 Do not resuscitate: Secondary | ICD-10-CM | POA: Diagnosis present

## 2019-09-06 DIAGNOSIS — R2243 Localized swelling, mass and lump, lower limb, bilateral: Secondary | ICD-10-CM | POA: Diagnosis not present

## 2019-09-06 DIAGNOSIS — I130 Hypertensive heart and chronic kidney disease with heart failure and stage 1 through stage 4 chronic kidney disease, or unspecified chronic kidney disease: Secondary | ICD-10-CM

## 2019-09-06 DIAGNOSIS — Z9841 Cataract extraction status, right eye: Secondary | ICD-10-CM

## 2019-09-06 DIAGNOSIS — R600 Localized edema: Secondary | ICD-10-CM

## 2019-09-06 DIAGNOSIS — I132 Hypertensive heart and chronic kidney disease with heart failure and with stage 5 chronic kidney disease, or end stage renal disease: Secondary | ICD-10-CM | POA: Diagnosis present

## 2019-09-06 DIAGNOSIS — L89312 Pressure ulcer of right buttock, stage 2: Secondary | ICD-10-CM | POA: Diagnosis present

## 2019-09-06 DIAGNOSIS — R41841 Cognitive communication deficit: Secondary | ICD-10-CM | POA: Diagnosis not present

## 2019-09-06 DIAGNOSIS — R531 Weakness: Secondary | ICD-10-CM | POA: Diagnosis not present

## 2019-09-06 DIAGNOSIS — G629 Polyneuropathy, unspecified: Secondary | ICD-10-CM | POA: Diagnosis present

## 2019-09-06 DIAGNOSIS — I959 Hypotension, unspecified: Secondary | ICD-10-CM | POA: Diagnosis not present

## 2019-09-06 DIAGNOSIS — D696 Thrombocytopenia, unspecified: Secondary | ICD-10-CM | POA: Diagnosis present

## 2019-09-06 DIAGNOSIS — I252 Old myocardial infarction: Secondary | ICD-10-CM | POA: Diagnosis not present

## 2019-09-06 DIAGNOSIS — M7989 Other specified soft tissue disorders: Secondary | ICD-10-CM | POA: Diagnosis not present

## 2019-09-06 DIAGNOSIS — I5033 Acute on chronic diastolic (congestive) heart failure: Secondary | ICD-10-CM

## 2019-09-06 DIAGNOSIS — R5381 Other malaise: Secondary | ICD-10-CM | POA: Diagnosis not present

## 2019-09-06 DIAGNOSIS — Z7901 Long term (current) use of anticoagulants: Secondary | ICD-10-CM | POA: Diagnosis not present

## 2019-09-06 DIAGNOSIS — Z881 Allergy status to other antibiotic agents status: Secondary | ICD-10-CM | POA: Diagnosis not present

## 2019-09-06 DIAGNOSIS — R2681 Unsteadiness on feet: Secondary | ICD-10-CM | POA: Diagnosis not present

## 2019-09-06 DIAGNOSIS — Z90710 Acquired absence of both cervix and uterus: Secondary | ICD-10-CM

## 2019-09-06 DIAGNOSIS — I482 Chronic atrial fibrillation, unspecified: Secondary | ICD-10-CM | POA: Diagnosis not present

## 2019-09-06 DIAGNOSIS — M1990 Unspecified osteoarthritis, unspecified site: Secondary | ICD-10-CM | POA: Diagnosis not present

## 2019-09-06 DIAGNOSIS — Z803 Family history of malignant neoplasm of breast: Secondary | ICD-10-CM | POA: Diagnosis not present

## 2019-09-06 DIAGNOSIS — N184 Chronic kidney disease, stage 4 (severe): Secondary | ICD-10-CM | POA: Diagnosis not present

## 2019-09-06 DIAGNOSIS — Z950 Presence of cardiac pacemaker: Secondary | ICD-10-CM | POA: Diagnosis present

## 2019-09-06 DIAGNOSIS — Z823 Family history of stroke: Secondary | ICD-10-CM

## 2019-09-06 DIAGNOSIS — J984 Other disorders of lung: Secondary | ICD-10-CM | POA: Diagnosis not present

## 2019-09-06 DIAGNOSIS — L8990 Pressure ulcer of unspecified site, unspecified stage: Secondary | ICD-10-CM | POA: Insufficient documentation

## 2019-09-06 DIAGNOSIS — I442 Atrioventricular block, complete: Secondary | ICD-10-CM | POA: Diagnosis present

## 2019-09-06 DIAGNOSIS — Z791 Long term (current) use of non-steroidal anti-inflammatories (NSAID): Secondary | ICD-10-CM

## 2019-09-06 DIAGNOSIS — I509 Heart failure, unspecified: Secondary | ICD-10-CM | POA: Diagnosis not present

## 2019-09-06 DIAGNOSIS — R52 Pain, unspecified: Secondary | ICD-10-CM | POA: Diagnosis not present

## 2019-09-06 DIAGNOSIS — L89322 Pressure ulcer of left buttock, stage 2: Secondary | ICD-10-CM | POA: Diagnosis present

## 2019-09-06 DIAGNOSIS — I110 Hypertensive heart disease with heart failure: Secondary | ICD-10-CM | POA: Diagnosis not present

## 2019-09-06 DIAGNOSIS — I440 Atrioventricular block, first degree: Secondary | ICD-10-CM | POA: Diagnosis present

## 2019-09-06 DIAGNOSIS — Z947 Corneal transplant status: Secondary | ICD-10-CM

## 2019-09-06 DIAGNOSIS — I10 Essential (primary) hypertension: Secondary | ICD-10-CM | POA: Diagnosis present

## 2019-09-06 DIAGNOSIS — N179 Acute kidney failure, unspecified: Secondary | ICD-10-CM

## 2019-09-06 DIAGNOSIS — Z888 Allergy status to other drugs, medicaments and biological substances status: Secondary | ICD-10-CM

## 2019-09-06 DIAGNOSIS — Z833 Family history of diabetes mellitus: Secondary | ICD-10-CM

## 2019-09-06 DIAGNOSIS — R32 Unspecified urinary incontinence: Secondary | ICD-10-CM | POA: Diagnosis not present

## 2019-09-06 DIAGNOSIS — M6281 Muscle weakness (generalized): Secondary | ICD-10-CM | POA: Diagnosis not present

## 2019-09-06 DIAGNOSIS — Z79899 Other long term (current) drug therapy: Secondary | ICD-10-CM

## 2019-09-06 DIAGNOSIS — Z8679 Personal history of other diseases of the circulatory system: Secondary | ICD-10-CM | POA: Diagnosis not present

## 2019-09-06 HISTORY — DX: Unspecified atrial fibrillation: I48.91

## 2019-09-06 HISTORY — DX: Heart failure, unspecified: I50.9

## 2019-09-06 HISTORY — DX: Disorder of kidney and ureter, unspecified: N28.9

## 2019-09-06 LAB — HEPATIC FUNCTION PANEL
ALT: 12 U/L (ref 0–44)
AST: 18 U/L (ref 15–41)
Albumin: 2.8 g/dL — ABNORMAL LOW (ref 3.5–5.0)
Alkaline Phosphatase: 99 U/L (ref 38–126)
Bilirubin, Direct: 0.3 mg/dL — ABNORMAL HIGH (ref 0.0–0.2)
Indirect Bilirubin: 0.8 mg/dL (ref 0.3–0.9)
Total Bilirubin: 1.1 mg/dL (ref 0.3–1.2)
Total Protein: 6.4 g/dL — ABNORMAL LOW (ref 6.5–8.1)

## 2019-09-06 LAB — CBC WITH DIFFERENTIAL/PLATELET
Abs Immature Granulocytes: 0.09 10*3/uL — ABNORMAL HIGH (ref 0.00–0.07)
Basophils Absolute: 0 10*3/uL (ref 0.0–0.1)
Basophils Relative: 0 %
Eosinophils Absolute: 0.3 10*3/uL (ref 0.0–0.5)
Eosinophils Relative: 3 %
HCT: 32.8 % — ABNORMAL LOW (ref 36.0–46.0)
Hemoglobin: 10.6 g/dL — ABNORMAL LOW (ref 12.0–15.0)
Immature Granulocytes: 1 %
Lymphocytes Relative: 12 %
Lymphs Abs: 1.1 10*3/uL (ref 0.7–4.0)
MCH: 31.6 pg (ref 26.0–34.0)
MCHC: 32.3 g/dL (ref 30.0–36.0)
MCV: 97.9 fL (ref 80.0–100.0)
Monocytes Absolute: 0.5 10*3/uL (ref 0.1–1.0)
Monocytes Relative: 5 %
Neutro Abs: 7 10*3/uL (ref 1.7–7.7)
Neutrophils Relative %: 79 %
Platelets: 37 10*3/uL — ABNORMAL LOW (ref 150–400)
RBC: 3.35 MIL/uL — ABNORMAL LOW (ref 3.87–5.11)
RDW: 15.2 % (ref 11.5–15.5)
WBC: 9 10*3/uL (ref 4.0–10.5)
nRBC: 0 % (ref 0.0–0.2)

## 2019-09-06 LAB — BASIC METABOLIC PANEL
Anion gap: 15 (ref 5–15)
BUN: 67 mg/dL — ABNORMAL HIGH (ref 8–23)
CO2: 26 mmol/L (ref 22–32)
Calcium: 9.7 mg/dL (ref 8.9–10.3)
Chloride: 97 mmol/L — ABNORMAL LOW (ref 98–111)
Creatinine, Ser: 2.78 mg/dL — ABNORMAL HIGH (ref 0.44–1.00)
GFR calc Af Amer: 16 mL/min — ABNORMAL LOW (ref >60)
GFR calc non Af Amer: 14 mL/min — ABNORMAL LOW (ref >60)
Glucose, Bld: 103 mg/dL — ABNORMAL HIGH (ref 70–99)
Potassium: 4.4 mmol/L (ref 3.5–5.1)
Sodium: 138 mmol/L (ref 135–145)

## 2019-09-06 LAB — BRAIN NATRIURETIC PEPTIDE: B Natriuretic Peptide: 258.7 pg/mL — ABNORMAL HIGH (ref 0.0–100.0)

## 2019-09-06 LAB — SARS CORONAVIRUS 2 (TAT 6-24 HRS): SARS Coronavirus 2: NEGATIVE

## 2019-09-06 LAB — PROTIME-INR
INR: 4.5 (ref 0.8–1.2)
Prothrombin Time: 42.6 seconds — ABNORMAL HIGH (ref 11.4–15.2)

## 2019-09-06 MED ORDER — ONDANSETRON HCL 4 MG/2ML IJ SOLN: 4.0000 mg | Freq: Four times a day (QID) | INTRAMUSCULAR | Status: DC | PRN

## 2019-09-06 MED ORDER — SODIUM CHLORIDE 0.9 % IV SOLN
250.0000 mL | INTRAVENOUS | Status: DC | PRN
  Administered 2019-09-11: 11:00:00 250 mL via INTRAVENOUS

## 2019-09-06 MED ORDER — BRIMONIDINE TARTRATE 0.2 % OP SOLN
1.0000 [drp] | Freq: Two times a day (BID) | OPHTHALMIC | Status: DC
  Administered 2019-09-06 – 2019-09-12 (×12): 1 [drp] via OPHTHALMIC
  Filled 2019-09-06: qty 5

## 2019-09-06 MED ORDER — WARFARIN SODIUM 3 MG PO TABS: 3.0000 mg | ORAL_TABLET | Freq: Every day | ORAL | Status: DC

## 2019-09-06 MED ORDER — VITAMIN B-12 1000 MCG PO TABS
1000.0000 ug | ORAL_TABLET | Freq: Two times a day (BID) | ORAL | Status: DC
  Administered 2019-09-06 – 2019-09-12 (×12): 1000 ug via ORAL
  Filled 2019-09-06 (×13): qty 1

## 2019-09-06 MED ORDER — DOCUSATE SODIUM 100 MG PO CAPS: 100.0000 mg | ORAL_CAPSULE | Freq: Every day | ORAL | Status: DC | PRN

## 2019-09-06 MED ORDER — FUROSEMIDE 10 MG/ML IJ SOLN
80.0000 mg | Freq: Once | INTRAMUSCULAR | Status: AC
  Administered 2019-09-06: 80 mg via INTRAVENOUS
  Filled 2019-09-06: qty 8

## 2019-09-06 MED ORDER — ACETAMINOPHEN 325 MG PO TABS
650.0000 mg | ORAL_TABLET | ORAL | Status: DC | PRN
  Administered 2019-09-06 – 2019-09-12 (×12): 650 mg via ORAL
  Filled 2019-09-06 (×13): qty 2

## 2019-09-06 MED ORDER — HYPROMELLOSE (GONIOSCOPIC) 2.5 % OP SOLN
1.0000 [drp] | Freq: Two times a day (BID) | OPHTHALMIC | Status: DC
  Administered 2019-09-06 – 2019-09-12 (×12): 1 [drp] via OPHTHALMIC
  Filled 2019-09-06: qty 15

## 2019-09-06 MED ORDER — SODIUM CHLORIDE 0.9% FLUSH: 3.0000 mL | INTRAVENOUS | Status: DC | PRN

## 2019-09-06 MED ORDER — PANTOPRAZOLE SODIUM 40 MG PO TBEC
40.0000 mg | DELAYED_RELEASE_TABLET | Freq: Every day | ORAL | Status: DC
  Administered 2019-09-06 – 2019-09-12 (×7): 40 mg via ORAL
  Filled 2019-09-06: qty 1
  Filled 2019-09-06: qty 2
  Filled 2019-09-06 (×6): qty 1

## 2019-09-06 MED ORDER — SODIUM CHLORIDE 0.9% FLUSH
3.0000 mL | Freq: Two times a day (BID) | INTRAVENOUS | Status: DC
  Administered 2019-09-06 – 2019-09-11 (×10): 3 mL via INTRAVENOUS

## 2019-09-06 MED ORDER — WARFARIN - PHARMACIST DOSING INPATIENT: Freq: Every day | Status: DC

## 2019-09-06 MED ORDER — PROSIGHT PO TABS
1.0000 | ORAL_TABLET | Freq: Every day | ORAL | Status: DC
  Administered 2019-09-07 – 2019-09-12 (×6): 1 via ORAL
  Filled 2019-09-06 (×7): qty 1

## 2019-09-06 MED ORDER — CARVEDILOL 6.25 MG PO TABS
6.2500 mg | ORAL_TABLET | Freq: Two times a day (BID) | ORAL | Status: DC
  Administered 2019-09-06 – 2019-09-12 (×11): 6.25 mg via ORAL
  Filled 2019-09-06 (×12): qty 1

## 2019-09-06 MED ORDER — TIMOLOL MALEATE 0.5 % OP SOLN
1.0000 [drp] | Freq: Two times a day (BID) | OPHTHALMIC | Status: DC
  Administered 2019-09-06 – 2019-09-12 (×12): 1 [drp] via OPHTHALMIC
  Filled 2019-09-06: qty 5

## 2019-09-06 MED ORDER — FUROSEMIDE 10 MG/ML IJ SOLN
80.0000 mg | Freq: Every day | INTRAMUSCULAR | Status: DC
  Administered 2019-09-07 – 2019-09-08 (×2): 80 mg via INTRAVENOUS
  Filled 2019-09-06 (×2): qty 8

## 2019-09-06 NOTE — Progress Notes (Signed)
Orthopedic Tech Progress Note Patient Details:  [name] 1923-05-16 [id number]  Ortho Devices Type of Ortho Device: Haematologist Ortho Device/Splint Location: BILATERALL LEGS Ortho Device/Splint Interventions: Ordered, Application   Post Interventions Patient Tolerated: Well Instructions Provided: Care of device, Adjustment of device   [name] 09/06/2019, 6:52 PM

## 2019-09-06 NOTE — H&P (Signed)
History and Physical    [name] [id number] DOB: June 18, 1923 DOA: 09/06/2019  PCP: Colon Branch, MD (Confirm with patient/family/NH records and if not entered, this has to be entered at [facility] point of entry) Patient coming from: Home  I have personally briefly reviewed patient's old medical records in [place]  Chief Complaint: Leg swelling  HPI: [name] is a 86 y.o. female with medical history significant of h/o Tako-Tsubo Cardiomyopathy dx in 2005, EF has been within normal limits for last 2 readings in 2-years. Also has a h/o HTN, permanent AF with tachy-brady syndrome s/p PPM, CKD stage III (progress to stage IV in December 2020) and fractured pelvis 05/2012. Patient has been followed by CHF team as outpatient, and recently her torsemide was increased to 80 mgbid. Also takes metolazone 4 days a week.  Patient was last seen in clinic on 06/26/19 with significant weight loss and improvement of swelling. Her meds were adjusted. Metolazone was discontinued and coreg down to 12.5 mg bid.  Yesterday, and went back to see her cardiologist, impression was patient is fluid overloaded and recommend IV diuresis, and patient was sent to the ED. patient reported that she has been feeling more swelling of her legs, but denied any significant increase of short of breath.  And she also reported significant weight gaining.  She said she still responds to torsemide, but she seems to urinate more after the evening dosage of torsemide than the morning ones.  No chest pain, no cough, no diarrhea, no abdominal pain or back pain.  She also has a CKD, but never followed any nephrologist in her past.  ED Course: BNP 258. CXR shows no overt edema. WBC nl 9.0. Afebrile. K 4.4. SCr 2.78. INR supratherapeutic at 4.5. Hgb 10.6 c/w baseline  Review of Systems: As per HPI otherwise 10 point review of systems negative.    Past Medical History:  Diagnosis Date  . Atrial fibrillation ([place])   . AV  block, 1st degree   . CAD (coronary artery disease)    Non ST elevation 2005 ; LHC in 6/05 in setting of NSTEMI: ant apical AK and inf-apical AK, EF 29%, dOM2 70-80% (small); findings c/w Tako-Tsubo CM  . Cardiomyopathy, nonischemic ([place]) 04/2008   Likely Tako-Tsubo. Resolved. --dx'd on cath 2005. EF 29% with minimal distal CAD (small OM1 70-80).  Echo 10/09 EF 55%;  echo 01/26/12: EF 55%, mild LAE, PASP 34.  Marland Kitchen CHB (complete heart block) (HCC)    Intermittent, s/p Saint Jude PPM  . CHF (congestive heart failure) ([place])   . Colitis, ischemic ([place])   . Complication of anesthesia    post anesthesia excessive somnulence  . Compression fracture of C-spine ([place]) 10/10/2011   Fell at home, tx at [facility]  . Diarrhea associated with pseudomembranous colitis 6/11-17/2013   [facility]  . Dyslipidemia   . Fall    with non-healing rib fractures  . Glaucoma   . Granuloma annulare   . History of phlebitis   . Hypertension    SEVERE  . Idiopathic thrombocytopenic purpura (ITP) (HCC)   . Lower extremity edema   . Lumbar stenosis 2004   DR. MARK ROY  . Multiple pelvic fractures ([place]) 05/2012   Dr Adaline Sill , [facility]  . Osteoporosis   . Renal disorder    Stage 3 kidney disease    Past Surgical History:  Procedure Laterality Date  . ABDOMINAL HYSTERECTOMY    . APPENDECTOMY    .  BACK SURGERY  10/2004   LS Disc 4-5 no sx.  Marland Kitchen CARDIAC CATHETERIZATION     12/2003  . CATARACT EXTRACTION, BILATERAL    . COLONOSCOPY  09/2005   adenoma  . COLONOSCOPY W/ POLYPECTOMY    . 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 PACEMAKER IMPLANT N/A 11/06/2016   Procedure: St Jude Pacemaker Implant;  Surgeon: Evans Lance, MD;  Location: [facility];  Service: Cardiovascular;  Laterality: N/A;  . rectal bleed  05-01-06   colonoscopy-ischemic colitis  . ROTATOR CUFF REPAIR    . VERTEBROPLASTY     Dr Gladstone Lighter     reports that she has never smoked. She has never used smokeless tobacco. She reports that she  does not drink alcohol or use drugs.  Allergies  Allergen Reactions  . Morphine And Related Anaphylaxis and Nausea Only    Per family, made her violently ill  . Oxycodone Other (See Comments)    Dizziness   . Penicillins Swelling and Other (See Comments)    Joint edema Has patient had a PCN reaction causing immediate rash, facial/tongue/throat swelling, SOB or lightheadedness with hypotension: Yes Has patient had a PCN reaction causing severe rash involving mucus membranes or skin necrosis: No Has patient had a PCN reaction that required hospitalization: No Has patient had a PCN reaction occurring within the last 10 years: No If all of the above answers are "NO", then may proceed with Cephalosporin use.   . Clindamycin/Lincomycin Hives and Itching  . Colchicine Other (See Comments)    Hair loss  . Norvasc [Amlodipine Besylate] Other (See Comments)    Reaction not recalled by the patient ??  . Ofloxacin Other (See Comments)    Unknown   . Prednisone Other (See Comments)    Blurred vision  . Streptomycin Other (See Comments)    "was a long time ago" reaction not recalled  . Tape Other (See Comments)    SKIN IS VERY THIN AND WILL TEAR AND BRUISE EASILY!!  . Tramadol Itching  . Alendronate Sodium Palpitations  . Amiodarone Rash and Other (See Comments)    Possible rash and nervousness per patient  . Plaquenil [Hydroxychloroquine Sulfate] Rash    Family History  Problem Relation Age of Onset  . Coronary artery disease Mother   . Heart failure Mother   . Renal cancer Mother   . Kidney disease Mother   . Stroke Father        in his 72s  . Colon cancer Father   . Breast cancer Sister         X 2  . Rectal cancer Sister   . Breast cancer Sister   . Diabetes Neg Hx      Prior to Admission medications   Medication Sig Start Date End Date Taking? Authorizing Provider  beta carotene w/minerals (OCUVITE) tablet Take 1 tablet by mouth daily.     [provider]    brimonidine (ALPHAGAN) 0.2 % ophthalmic solution Place 1 drop into the right eye 2 (two) times daily.      [provider]  Calcium Carbonate-Vit D-Min (CALCIUM 1200 PO) Take 1,200 mg by mouth daily with breakfast.    [provider]  carboxymethylcellulose (REFRESH TEARS) 0.5 % SOLN Place 1 drop into both eyes 2 (two) times daily.     [provider]  carvedilol (COREG) 12.5 MG tablet Take 0.5 tablets (6.25 mg total) by mouth 2 (two) times daily with a  meal. 08/08/19   Colon Branch, MD  Cholecalciferol (VITAMIN D3) 3000 UNITS TABS Take 3,000 Units by mouth daily.     [provider]  diclofenac Sodium (VOLTAREN) 1 % GEL Apply 2 g topically 4 (four) times daily as needed. 08/08/19   Colon Branch, MD  diphenhydrAMINE (BENADRYL) 25 MG tablet Take 25 mg by mouth at bedtime as needed for sleep (and sinus issues).     [provider]  docusate sodium (COLACE) 100 MG capsule Take 100 mg by mouth daily as needed for mild constipation. Stool softener     [provider]  Misc Natural Products (OSTEO BI-FLEX ADV JOINT SHIELD) TABS Take 1 tablet by mouth daily.     [provider]  omeprazole (PRILOSEC) 20 MG capsule Take 20 mg by mouth daily.      [provider]  potassium chloride SA (KLOR-CON) 20 MEQ tablet Take 1 tablet (20 mEq total) by mouth 2 (two) times daily. 08/08/19   Colon Branch, MD  timolol (TIMOPTIC) 0.5 % ophthalmic solution Place 1 drop into the right eye 2 (two) times daily. 05/31/18   [provider]  torsemide (DEMADEX) 20 MG tablet Take 4 tablets (80 mg total) by mouth 2 (two) times daily. May take 1 extra tab for weight gain of 3lbs in 24 hours Patient taking differently: Take 5 tabs in AM and 5 tabs in PM. 07/20/19   Rosita Fire, Wilmer Floor, PA-C  vitamin B-12 (CYANOCOBALAMIN) 1000 MCG tablet Take 1,000 mcg by mouth 2 (two) times daily.     [provider]  warfarin (COUMADIN) 3 MG tablet Take 1 tablet (3 mg  total) by mouth daily. 03/14/19   Evans Lance, MD    Physical Exam: Vitals:   09/06/19 1500 09/06/19 1515 09/06/19 1530 09/06/19 1545  BP: 124/71 133/81 126/78 127/75  Pulse: 96 97 96 95  Resp: 14 16 16 14   SpO2: 98% 98% 99% 99%    Constitutional: NAD, calm, comfortable Vitals:   09/06/19 1500 09/06/19 1515 09/06/19 1530 09/06/19 1545  BP: 124/71 133/81 126/78 127/75  Pulse: 96 97 96 95  Resp: 14 16 16 14   SpO2: 98% 98% 99% 99%   Eyes: PERRL, lids and conjunctivae normal ENMT: Mucous membranes are moist. Posterior pharynx clear of any exudate or lesions.Normal dentition.  Neck: normal, supple, JVD about 6 cm above clavicles Respiratory: clear to auscultation bilaterally, no wheezing, no crackles. Normal respiratory effort. No accessory muscle use.  Cardiovascular: Regular rate and rhythm, no murmurs / rubs / gallops. 2+ extremity edema. 2+ pedal pulses. No carotid bruits.  Abdomen: no tenderness, no masses palpated. No hepatosplenomegaly. Bowel sounds positive.  Musculoskeletal: no clubbing / cyanosis. No joint deformity upper and lower extremities. Good ROM, no contractures. Normal muscle tone.  Skin: no rashes, lesions, ulcers. No induration Neurologic: CN 2-12 grossly intact. Sensation intact, DTR normal. Strength 5/5 in all 4.  Psychiatric: Normal judgment and insight. Alert and oriented x 3. Normal mood.     Labs on Admission: I have personally reviewed following labs and imaging studies  CBC: Recent Labs  Lab 09/06/19 1413  WBC 9.0  NEUTROABS 7.0  HGB 10.6*  HCT 32.8*  MCV 97.9  PLT 37*   Basic Metabolic Panel: Recent Labs  Lab 09/05/19 1612 09/06/19 1413  NA 136 138  K 4.8 4.4  CL 98 97*  CO2 23 26  GLUCOSE 98 103*  BUN 73* 67*  CREATININE 2.76* 2.78*  CALCIUM  9.5 9.7   GFR: Estimated Creatinine Clearance: 11.7 mL/min (A) (by C-G formula based on SCr of 2.78 mg/dL (H)). Liver Function Tests: No results for input(s): AST, ALT, ALKPHOS, BILITOT,  PROT, ALBUMIN in the last 168 hours. No results for input(s): LIPASE, AMYLASE in the last 168 hours. No results for input(s): AMMONIA in the last 168 hours. Coagulation Profile: Recent Labs  Lab 09/05/19 1612 09/06/19 1413  INR 4.2* 4.5*   Cardiac Enzymes: No results for input(s): CKTOTAL, CKMB, CKMBINDEX, TROPONINI in the last 168 hours. BNP (last 3 results) Recent Labs    03/01/19 1517  PROBNP 479.0*   HbA1C: No results for input(s): HGBA1C in the last 72 hours. CBG: No results for input(s): GLUCAP in the last 168 hours. Lipid Profile: No results for input(s): CHOL, HDL, LDLCALC, TRIG, CHOLHDL, LDLDIRECT in the last 72 hours. Thyroid Function Tests: No results for input(s): TSH, T4TOTAL, FREET4, T3FREE, THYROIDAB in the last 72 hours. Anemia Panel: No results for input(s): VITAMINB12, FOLATE, FERRITIN, TIBC, IRON, RETICCTPCT in the last 72 hours. Urine analysis:    Component Value Date/Time   COLORURINE YELLOW 09/23/2015 2210   APPEARANCEUR CLOUDY (A) 09/23/2015 2210   LABSPEC 1.017 09/23/2015 2210   PHURINE 6.5 09/23/2015 2210   GLUCOSEU NEGATIVE 09/23/2015 2210   GLUCOSEU NEGATIVE 01/19/2012 1526   HGBUR SMALL (A) 09/23/2015 2210   BILIRUBINUR NEGATIVE 09/23/2015 2210   KETONESUR 15 (A) 09/23/2015 2210   PROTEINUR NEGATIVE 09/23/2015 2210   UROBILINOGEN 0.2 01/19/2012 1526   NITRITE POSITIVE (A) 09/23/2015 2210   LEUKOCYTESUR MODERATE (A) 09/23/2015 2210    Radiological Exams on Admission: DG Chest Port 1 View  Result Date: 09/06/2019 CLINICAL DATA:  Lower extremity edema. History of congestive heart failure EXAM: PORTABLE CHEST 1 VIEW COMPARISON:  June 16, 2018. FINDINGS: There is mild scarring in the right base. There is no edema or airspace opacity. Heart is upper normal in size with pulmonary vascularity normal. Pacemaker leads are attached to the right atrium and right ventricle. There is aortic atherosclerosis. No adenopathy. There old healed rib  fractures on the right. There is a focal hiatal hernia. There is arthropathy in each shoulder. IMPRESSION: Scarring right base. No edema or airspace opacity. Stable cardiac silhouette. Pacemaker leads attached to right atrium and right ventricle. Aortic Atherosclerosis (ICD10-I70.0). Hiatal hernia present. Evidence of arthropathy in the shoulders and old rib trauma on the right. Electronically Signed   By: Lowella Grip III M.D.   On: 09/06/2019 14:56    EKG: Independently reviewed.  A. fib  Assessment/Plan Active Problems:   * No active hospital problems. *   Acute on chronic diastolic CHF decompensation Reviewed her most recent echo last year and 2 years ago, showing normal LVEF 55 to 60%, and no significant right-sided failure either, and no significant valvular issue.  Discussed with PA from cardio, who recommends 80 mg IV Lasix x 2 days, then transition to higher dose of torsemide 100 mg bid there after. Patient creatinine level has been steadily creeping up since August 2020, overall she is still fluid overload, and as of now aggressive diuresis still a priority.  However will check her albumin level, and send a UA to make sure she does not have concurrent intrinsic/secondary kidney issue.  She is not interested in HD given her age.  chronic A. Fib Patient was on Xarelto but lost coverage and switched to Coumadin last summer.  Consult transition care team. Continue Coreg.  AKI on CKD stage III-IV  As above, send UA and also send a renal ultrasound. Still fluid overload, diuresis first and re-check BMP tomorrow  Coumadin induced coagulopathy Hold Coumadin for today Recheck INR tomorrow.  HTN Fairly controlled continue current regimen.     DVT prophylaxis: Suprapubic INR Code Status: DNR Family Communication: None at bedside Disposition Plan: Discuss with cardiology regarding discharge plan Consults called: Cardiology PA Trish Admission status: Telemetry  admission   Lequita Halt MD [facility] Pager [phone]    09/06/2019, 4:46 PM

## 2019-09-06 NOTE — Consult Note (Addendum)
Advanced Heart Failure Team Consult Note   Primary Physician: Colon Branch, MD PCP-Cardiologist:  Glori Bickers, MD  Reason for Consultation: acute on chronic diastolic HF  HPI:    [name] is seen today for evaluation of acute on chronic diastolic heart failure at the request of Dr. Johnney Killian, Emergency Medicine.   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 LHC in 6/05 in setting of NSTEMI: ant apical AK and inf-apical AK, EF 29%, dOM2 70-80% (small). EF has recovered. Also has a h/o HTN, permanent AF with tachy-brady syndrome s/p PPM and fractured pelvis 05/2012.  Admitted 4/27-4/28/18 for evaluation of tachycardia. Had recent holter monitor that showed 6.5 second pause. Coreg stopped and referred to EP. She then developed Afib RVR. With pause, PPM was placed 11/06/16 by Dr. Lovena Le. Noted to be in Afib with controlled ventricular rate on discharge.   Seen recently on 10/19 and again 10/26 for volume overload w/ subsequent up titration of diuretic regimen. On 10/26,  Torsemide was further increased to 80 mgbid. Also takes metolazone 4 days a week. Home health was also ordered to assist w/ wrapping of legs to help w/ edema.  Seen back in clinic on 06/26/19. Weight decreased from 148--->141 lb.  She was felt to be hypovolemic from over diuresis.  Her meds were adjusted. Metolazone was discontinued and she was instructed to hold torsemide x 2 days then resume 80 mg bid. Also instructed to reduce coreg down to 12.5 mg bid.   Had return visit yesterday. Marked volume overload w/ NYHA class III symptoms. She was referred to [place] w/ plans to treat w/ 80 mg IV Lasix x 2 days, then transition to higher dose of torsemide 100 mg bid there after. Arrangements were made but she was afraid of falling getting up to use the restroom due to frequent urination and came to ED.   BNP 258. CXR shows no overt edema. WBC nl 9.0.  Afebrile. K 4.4. SCr 2.78. INR supratherapeutic at 4.5. Hgb 10.6 c/w baseline    Echo 05/2019: EF 55-60%. Mild LVH. RV systolic function mildly reduced. Mild AI/ sclerosis w/o stenosis, trace MR    Review of Systems: [y] = yes, [ ]  = no   . General: Weight gain [ ] ; Weight loss [ ] ; Anorexia [ ] ; Fatigue [ ] ; Fever [ ] ; Chills [ ] ; Weakness [ ]   . Cardiac: Chest pain/pressure [ ] ; Resting SOB [ ] ; Exertional SOB [ ] ; Orthopnea [ ] ; Pedal Edema [ ] ; Palpitations [ ] ; Syncope [ ] ; Presyncope [ ] ; Paroxysmal nocturnal dyspnea[ ]   . Pulmonary: Cough [ ] ; Wheezing[ ] ; Hemoptysis[ ] ; Sputum [ ] ; Snoring [ ]   . GI: Vomiting[ ] ; Dysphagia[ ] ; Melena[ ] ; Hematochezia [ ] ; Heartburn[ ] ; Abdominal pain [ ] ; Constipation [ ] ; Diarrhea [ ] ; BRBPR [ ]   . GU: Hematuria[ ] ; Dysuria [ ] ; Nocturia[ ]   . Vascular: Pain in legs with walking [ ] ; Pain in feet with lying flat [ ] ; Non-healing sores [ ] ; Stroke [ ] ; TIA [ ] ; Slurred speech [ ] ;  . Neuro: Headaches[ ] ; Vertigo[ ] ; Seizures[ ] ; Paresthesias[ ] ;Blurred vision [ ] ; Diplopia [ ] ; Vision changes [ ]   . Ortho/Skin: Arthritis [ ] ; Joint pain [ ] ; Muscle pain [ ] ; Joint swelling [ ] ; Back Pain [ ] ; Rash [ ]   . Psych: Depression[ ] ; Anxiety[ ]   . Heme: Bleeding problems [ ] ;  Clotting disorders [ ] ; Anemia [ ]   . Endocrine: Diabetes [ ] ; Thyroid dysfunction[ ]   Home Medications Prior to Admission medications   Medication Sig Start Date End Date Taking? Authorizing Provider  beta carotene w/minerals (OCUVITE) tablet Take 1 tablet by mouth daily.     [provider]  brimonidine (ALPHAGAN) 0.2 % ophthalmic solution Place 1 drop into the right eye 2 (two) times daily.      [provider]  Calcium Carbonate-Vit D-Min (CALCIUM 1200 PO) Take 1,200 mg by mouth daily with breakfast.    [provider]  carboxymethylcellulose (REFRESH TEARS) 0.5 % SOLN Place 1 drop into both eyes 2 (two) times daily.     [provider]    carvedilol (COREG) 12.5 MG tablet Take 0.5 tablets (6.25 mg total) by mouth 2 (two) times daily with a meal. 08/08/19   Colon Branch, MD  Cholecalciferol (VITAMIN D3) 3000 UNITS TABS Take 3,000 Units by mouth daily.     [provider]  diclofenac Sodium (VOLTAREN) 1 % GEL Apply 2 g topically 4 (four) times daily as needed. 08/08/19   Colon Branch, MD  diphenhydrAMINE (BENADRYL) 25 MG tablet Take 25 mg by mouth at bedtime as needed for sleep (and sinus issues).     [provider]  docusate sodium (COLACE) 100 MG capsule Take 100 mg by mouth daily as needed for mild constipation. Stool softener     [provider]  Misc Natural Products (OSTEO BI-FLEX ADV JOINT SHIELD) TABS Take 1 tablet by mouth daily.     [provider]  omeprazole (PRILOSEC) 20 MG capsule Take 20 mg by mouth daily.      [provider]  potassium chloride SA (KLOR-CON) 20 MEQ tablet Take 1 tablet (20 mEq total) by mouth 2 (two) times daily. 08/08/19   Colon Branch, MD  timolol (TIMOPTIC) 0.5 % ophthalmic solution Place 1 drop into the right eye 2 (two) times daily. 05/31/18   [provider]  torsemide (DEMADEX) 20 MG tablet Take 4 tablets (80 mg total) by mouth 2 (two) times daily. May take 1 extra tab for weight gain of 3lbs in 24 hours Patient taking differently: Take 5 tabs in AM and 5 tabs in PM. 07/20/19   Rosita Fire, Wilmer Floor, PA-C  vitamin B-12 (CYANOCOBALAMIN) 1000 MCG tablet Take 1,000 mcg by mouth 2 (two) times daily.     [provider]  warfarin (COUMADIN) 3 MG tablet Take 1 tablet (3 mg total) by mouth daily. 03/14/19   Evans Lance, MD    Past Medical History: Past Medical History:  Diagnosis Date  . Atrial fibrillation ([place])   . AV block, 1st degree   . CAD (coronary artery disease)    Non ST elevation 2005 ; LHC in 6/05 in setting of NSTEMI: ant apical AK and inf-apical AK, EF 29%, dOM2 70-80% (small); findings c/w Tako-Tsubo CM  . Cardiomyopathy,  nonischemic ([place]) 04/2008   Likely Tako-Tsubo. Resolved. --dx'd on cath 2005. EF 29% with minimal distal CAD (small OM1 70-80).  Echo 10/09 EF 55%;  echo 01/26/12: EF 55%, mild LAE, PASP 34.  Marland Kitchen CHB (complete heart block) (HCC)    Intermittent, s/p Saint Jude PPM  . CHF (congestive heart failure) ([place])   . Colitis, ischemic ([place])   . Complication of anesthesia    post anesthesia excessive somnulence  . Compression fracture of C-spine ([place]) 10/10/2011   Fell at home, tx at [place]  .  Diarrhea associated with pseudomembranous colitis 6/11-17/2013   [facility]  . Dyslipidemia   . Fall    with non-healing rib fractures  . Glaucoma   . Granuloma annulare   . History of phlebitis   . Hypertension    SEVERE  . Idiopathic thrombocytopenic purpura (ITP) (HCC)   . Lower extremity edema   . Lumbar stenosis 2004   DR. MARK ROY  . Multiple pelvic fractures ([place]) 05/2012   Dr Adaline Sill , [facility]  . Osteoporosis   . Renal disorder    Stage 3 kidney disease    Past Surgical History: Past Surgical History:  Procedure Laterality Date  . ABDOMINAL HYSTERECTOMY    . APPENDECTOMY    . BACK SURGERY  10/2004   LS Disc 4-5 no sx.  Marland Kitchen CARDIAC CATHETERIZATION     12/2003  . CATARACT EXTRACTION, BILATERAL    . COLONOSCOPY  09/2005   adenoma  . COLONOSCOPY W/ POLYPECTOMY    . 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 PACEMAKER IMPLANT N/A 11/06/2016   Procedure: St Jude Pacemaker Implant;  Surgeon: Evans Lance, MD;  Location: [facility];  Service: Cardiovascular;  Laterality: N/A;  . rectal bleed  05-01-06   colonoscopy-ischemic colitis  . ROTATOR CUFF REPAIR    . VERTEBROPLASTY     Dr Gladstone Lighter    Family History: Family History  Problem Relation Age of Onset  . Coronary artery disease Mother   . Heart failure Mother   . Renal cancer Mother   . Kidney disease Mother   . Stroke Father        in his 19s  . Colon cancer Father   . Breast cancer Sister         X 2  . Rectal  cancer Sister   . Breast cancer Sister   . Diabetes Neg Hx     Social History: Social History   Socioeconomic History  . Marital status: Widowed    Spouse name: Not on file  . Number of children: 1  . Years of education: Not on file  . Highest education level: Not on file  Occupational History  . Occupation: retired    Fish farm manager: RETIRED  Tobacco Use  . Smoking status: Never Smoker  . Smokeless tobacco: Never Used  Substance and Sexual Activity  . Alcohol use: No  . Drug use: No  . Sexual activity: Not Currently  Other Topics Concern  . Not on file  Social History Narrative   Widowed   Lives by herself in a town house, drives    Son Deta Dipasquale , lives near by    Social Determinants of [employer] Strain:   . Difficulty of Paying Living Expenses: Not on file  Food Insecurity:   . Worried About Charity fundraiser in the Last Year: Not on file  . Ran Out of Food in the Last Year: Not on file  Transportation Needs:   . Lack of Transportation (Medical): Not on file  . Lack of Transportation (Non-Medical): Not on file  Physical Activity:   . Days of Exercise per Week: Not on file  . Minutes of Exercise per Session: Not on file  Stress:   . Feeling of Stress : Not on file  Social Connections:   . Frequency of Communication with Friends and Family: Not on file  . Frequency of Social Gatherings with Friends and Family: Not on file  . Attends  Religious Services: Not on file  . Active Member of Clubs or Organizations: Not on file  . Attends Archivist Meetings: Not on file  . Marital Status: Not on file    Allergies:  Allergies  Allergen Reactions  . Morphine And Related Anaphylaxis and Nausea Only    Per family, made her violently ill  . Oxycodone Other (See Comments)    Dizziness   . Penicillins Swelling and Other (See Comments)    Joint edema Has patient had a PCN reaction causing immediate rash, facial/tongue/throat swelling, SOB or  lightheadedness with hypotension: Yes Has patient had a PCN reaction causing severe rash involving mucus membranes or skin necrosis: No Has patient had a PCN reaction that required hospitalization: No Has patient had a PCN reaction occurring within the last 10 years: No If all of the above answers are "NO", then may proceed with Cephalosporin use.   . Clindamycin/Lincomycin Hives and Itching  . Colchicine Other (See Comments)    Hair loss  . Norvasc [Amlodipine Besylate] Other (See Comments)    Reaction not recalled by the patient ??  . Ofloxacin Other (See Comments)    Unknown   . Prednisone Other (See Comments)    Blurred vision  . Streptomycin Other (See Comments)    "was a long time ago" reaction not recalled  . Tape Other (See Comments)    SKIN IS VERY THIN AND WILL TEAR AND BRUISE EASILY!!  . Tramadol Itching  . Alendronate Sodium Palpitations  . Amiodarone Rash and Other (See Comments)    Possible rash and nervousness per patient  . Plaquenil [Hydroxychloroquine Sulfate] Rash    Objective:    Vital Signs:   Pulse Rate:  [95-99] 95 (02/24 1545) Resp:  [14-19] 14 (02/24 1545) BP: (124-133)/(71-96) 127/75 (02/24 1545) SpO2:  [96 %-99 %] 99 % (02/24 1545)    Weight change: There were no vitals filed for this visit.  Intake/Output:  No intake or output data in the 24 hours ending 09/06/19 1713    Physical Exam    General:  Elderly WF. Well appearing. No resp difficulty, laying flat HEENT: normal Neck: supple. Elevated JVP . Carotids 2+ bilat; no bruits. No lymphadenopathy or thyromegaly appreciated. Cor: PMI nondisplaced. Irregularly irregular rhythm, regular rate. No rubs, gallops or murmurs. Lungs: clear  Abdomen: soft, nontender, nondistended. No hepatosplenomegaly. No bruits or masses. Good bowel sounds. Extremities: no cyanosis, clubbing, rash, 3+ bilateral LE edema up to knees Neuro: alert & orientedx3, cranial nerves grossly intact. moves all 4 extremities  w/o difficulty. Affect pleasant   Telemetry   Atrial fibrillation 90s   EKG    Atrial fibrillation 98 bpm   Labs   Basic Metabolic Panel: Recent Labs  Lab 09/05/19 1612 09/06/19 1413  NA 136 138  K 4.8 4.4  CL 98 97*  CO2 23 26  GLUCOSE 98 103*  BUN 73* 67*  CREATININE 2.76* 2.78*  CALCIUM 9.5 9.7    Liver Function Tests: No results for input(s): AST, ALT, ALKPHOS, BILITOT, PROT, ALBUMIN in the last 168 hours. No results for input(s): LIPASE, AMYLASE in the last 168 hours. No results for input(s): AMMONIA in the last 168 hours.  CBC: Recent Labs  Lab 09/06/19 1413  WBC 9.0  NEUTROABS 7.0  HGB 10.6*  HCT 32.8*  MCV 97.9  PLT 37*    Cardiac Enzymes: No results for input(s): CKTOTAL, CKMB, CKMBINDEX, TROPONINI in the last 168 hours.  BNP: BNP (last 3 results) Recent  Labs    08/09/19 1420 09/05/19 1612 09/06/19 1413  BNP 259.6* 374.0* 258.7*    ProBNP (last 3 results) Recent Labs    03/01/19 1517  PROBNP 479.0*     CBG: No results for input(s): GLUCAP in the last 168 hours.  Coagulation Studies: Recent Labs    09/05/19 1612 09/06/19 1413  LABPROT 40.8* 42.6*  INR 4.2* 4.5*     Imaging   DG Chest Port 1 View  Result Date: 09/06/2019 CLINICAL DATA:  Lower extremity edema. History of congestive heart failure EXAM: PORTABLE CHEST 1 VIEW COMPARISON:  June 16, 2018. FINDINGS: There is mild scarring in the right base. There is no edema or airspace opacity. Heart is upper normal in size with pulmonary vascularity normal. Pacemaker leads are attached to the right atrium and right ventricle. There is aortic atherosclerosis. No adenopathy. There old healed rib fractures on the right. There is a focal hiatal hernia. There is arthropathy in each shoulder. IMPRESSION: Scarring right base. No edema or airspace opacity. Stable cardiac silhouette. Pacemaker leads attached to right atrium and right ventricle. Aortic Atherosclerosis (ICD10-I70.0). Hiatal  hernia present. Evidence of arthropathy in the shoulders and old rib trauma on the right. Electronically Signed   By: Lowella Grip III M.D.   On: 09/06/2019 14:56     Medications:     Current Medications:    Infusions:     Assessment/Plan    1. Acute on Chronic diastolic CHF: History of Takotsubo cardiomyopathy, EF has recovered. - Echo 05/2019 EF 55-60%, RV mildly reduced  - R>>L HF symptoms, mainly systemic congestion. CXR w/o overt edema  - Marked volume overload.  - Recommend IV Lasix for diuresis, 80 bid - Add Unna boots for edema - Monitor renal function closely, baseline stage IV CKD  2. CAD: -Minimal branch vessel disease on cath - No chest pain  - intolerant of statins - no ASA due to coumadin  3.Permanent AF with Tachybrady syndrome s/p St Jude PPM 11/06/16 -Rate controlled.  - Continue coreg 6.25 mg bid - Hold coumadin for supratheaputic INR (4.6)  4.HTN   - normotensive in ED  - continue home regimen, Coreg 6.25 bid - monitor pressure w/ diuresis    5. AKI on CKD, Stage IV-V: - baseline SCr ~2.0-2.5 - 2.78 on admit - monitor w/ IV Lasix. Daily BMPs   6. Supratherapeutic INR:  - on coumadin for Afib - INR 4.6 (hgb stable at 10.6- baseline), likely 2/2 hepatic congestion from CHF - hold coumadin. Monitor for bleeding    Length of Stay: 0  Lyda Jester, PA-C  09/06/2019, 5:13 PM  Advanced Heart Failure Team Pager [phone] (M-F; 7a - 4p)  Please contact [facility] for night-coverage after hours (4p -7a ) and weekends on [email or web address]  Patient seen and examined with the above-signed Advanced Practice Provider and/or Housestaff. I personally reviewed laboratory data, imaging studies and relevant notes. I independently examined the patient and formulated the important aspects of the plan. I have edited the note to reflect any of my changes or salient points. I have personally discussed the plan with the patient and/or  family.  86 y/o woman with diastolic HF, chronic AF and CKD IV now presents with marked volume overload with R>L symptoms. Responding well to IV lasix. INR high likely due to passive hepatic congestion in setting of HF.   General:  Elderly fraillappearing. No resp difficulty HEENT: normal Neck: supple. JVP to ear Carotids 2+ bilat; no bruits. No lymphadenopathy  or thryomegaly appreciated. Cor: PMI nondisplaced. Irregular rate & rhythm. No rubs, gallops or murmurs. Lungs: clear Abdomen: soft, nontender, nondistended. No hepatosplenomegaly. No bruits or masses. Good bowel sounds. Extremities: no cyanosis, clubbing, rash, 3+ edema into thighs Neuro: alert & orientedx3, cranial nerves grossly intact. moves all 4 extremities w/o difficulty. Affect pleasant  She has marked volume overload with R>L symptoms. Responding well to IV lasix. Will continue. Place UNNA boots. Suspect renal function will improve with decongestion. Hold coumadin for now.   Glori Bickers, MD  6:15 PM

## 2019-09-06 NOTE — Progress Notes (Signed)
ANTICOAGULATION CONSULT NOTE - Initial Consult  Pharmacy Consult for warfarin Indication: atrial fibrillation  Allergies  Allergen Reactions  . Morphine And Related Anaphylaxis and Nausea Only    Per family, made her violently ill  . Oxycodone Other (See Comments)    Dizziness   . Penicillins Swelling and Other (See Comments)    Joint edema Has patient had a PCN reaction causing immediate rash, facial/tongue/throat swelling, SOB or lightheadedness with hypotension: Yes Has patient had a PCN reaction causing severe rash involving mucus membranes or skin necrosis: No Has patient had a PCN reaction that required hospitalization: No Has patient had a PCN reaction occurring within the last 10 years: No If all of the above answers are "NO", then may proceed with Cephalosporin use.   . Clindamycin/Lincomycin Hives and Itching  . Colchicine Other (See Comments)    Hair loss  . Norvasc [Amlodipine Besylate] Other (See Comments)    Reaction not recalled by the patient ??  . Ofloxacin Other (See Comments)    Unknown   . Prednisone Other (See Comments)    Blurred vision  . Streptomycin Other (See Comments)    "was a long time ago" reaction not recalled  . Tape Other (See Comments)    SKIN IS VERY THIN AND WILL TEAR AND BRUISE EASILY!!  . Tramadol Itching  . Alendronate Sodium Palpitations  . Amiodarone Rash and Other (See Comments)    Possible rash and nervousness per patient  . Plaquenil [Hydroxychloroquine Sulfate] Rash    Patient Measurements:   Heparin Dosing Weight:   Vital Signs: BP: 127/75 (02/24 1545) Pulse Rate: 95 (02/24 1545)  Labs: Recent Labs    09/05/19 1612 09/06/19 1413  HGB  --  10.6*  HCT  --  32.8*  PLT  --  37*  LABPROT 40.8* 42.6*  INR 4.2* 4.5*  CREATININE 2.76* 2.78*    Estimated Creatinine Clearance: 11.7 mL/min (A) (by C-G formula based on SCr of 2.78 mg/dL (H)).   Medical History: Past Medical History:  Diagnosis Date  . Atrial  fibrillation ([place])   . AV block, 1st degree   . CAD (coronary artery disease)    Non ST elevation 2005 ; LHC in 6/05 in setting of NSTEMI: ant apical AK and inf-apical AK, EF 29%, dOM2 70-80% (small); findings c/w Tako-Tsubo CM  . Cardiomyopathy, nonischemic ([place]) 04/2008   Likely Tako-Tsubo. Resolved. --dx'd on cath 2005. EF 29% with minimal distal CAD (small OM1 70-80).  Echo 10/09 EF 55%;  echo 01/26/12: EF 55%, mild LAE, PASP 34.  Marland Kitchen CHB (complete heart block) (HCC)    Intermittent, s/p Saint Jude PPM  . CHF (congestive heart failure) ([place])   . Colitis, ischemic ([place])   . Complication of anesthesia    post anesthesia excessive somnulence  . Compression fracture of C-spine ([place]) 10/10/2011   Fell at home, tx at [facility]  . Diarrhea associated with pseudomembranous colitis 6/11-17/2013   [facility]  . Dyslipidemia   . Fall    with non-healing rib fractures  . Glaucoma   . Granuloma annulare   . History of phlebitis   . Hypertension    SEVERE  . Idiopathic thrombocytopenic purpura (ITP) (HCC)   . Lower extremity edema   . Lumbar stenosis 2004   DR. MARK ROY  . Multiple pelvic fractures ([place]) 05/2012   Dr Adaline Sill , [facility]  . Osteoporosis   . Renal disorder    Stage 3 kidney disease    Medications:  Scheduled:  . brimonidine  1 drop Right Eye BID  . carvedilol  6.25 mg Oral BID WC  . [START ON 09/07/2019] furosemide  80 mg Intravenous Daily  . hydroxypropyl methylcellulose / hypromellose  1 drop Both Eyes BID  . [START ON 09/07/2019] multivitamin  1 tablet Oral Daily  . pantoprazole  40 mg Oral Daily  . sodium chloride flush  3 mL Intravenous Q12H  . timolol  1 drop Right Eye BID  . vitamin B-12  1,000 mcg Oral BID   Infusions:  . sodium chloride      Assessment: 86 y/o female with a PMH significant for atrial fibrillation on warfarin PTA. Home regimen has been recently changed to warfarin 1.5 mg on Thur/Sat/Sun/Tues and 3 mg on Fri/Mon.   Patients INR today is  supratherapeutic at 4.5 today. Hgb low at 10.6. Platelets low at 37. No bleeding noted at this time.   Goal of Therapy:  INR 2-3 Monitor platelets by anticoagulation protocol: Yes   Plan:  - Will hold warfarin dose this evening - Monitor INR daily for dose adjustments - Monitor CBC and for s/sx of bleeding  Agnes Lawrence, PharmD PGY1 Pharmacy Resident

## 2019-09-06 NOTE — ED Provider Notes (Signed)
[facility] EMERGENCY DEPARTMENT Provider Note   CSN: [id number] Arrival date & time: 09/06/19  1405     History Chief Complaint  Patient presents with  . Leg Swelling    bilateral     KILIAN BIESCHKE is a 86 y.o. female.  The history is provided by the patient and medical records. No language interpreter was used.     86 year old female with history of CHF with an EF of 55-60%, CAD, atrial fibrillation s/p PPM, chronic kidney disease brought here via EMS for evaluation of leg swelling.  Patient report for the past 2 weeks she has noticed increased bilateral leg swelling.  She also endorsed tightness sensation to both legs due to the swelling.  He she is having increased trouble ambulating despite using a walker because of the swelling in her legs.  She does not complain of any significant fever chills no significant shortness of breath, abdominal pain, back pain, focal numbness or focal weakness.  She was seen by her heart failure clinic yesterday for her complaint.  She was noted to have mild volume overload and was recommended to receive 80 mg of IV Lasix for the next 2 days and then restart torsemide 100 mg twice a day.  She decides to come here today because patient lives at home and was afraid that she may fall due to having to use the bathroom frequently if she is on increased Lasix.  No recent COVID-19 exposure.  Past Medical History:  Diagnosis Date  . Atrial fibrillation ([place])   . AV block, 1st degree   . CAD (coronary artery disease)    Non ST elevation 2005 ; [facility] in 6/05 in setting of NSTEMI: ant apical AK and inf-apical AK, EF 29%, dOM2 70-80% (small); findings c/w Tako-Tsubo CM  . Cardiomyopathy, nonischemic ([place]) 04/2008   Likely Tako-Tsubo. Resolved. --dx'd on cath 2005. EF 29% with minimal distal CAD (small OM1 70-80).  Echo 10/09 EF 55%;  echo 01/26/12: EF 55%, mild LAE, PASP 34.  Marland Kitchen CHB (complete heart block) (HCC)    Intermittent, s/p Saint Jude  PPM  . CHF (congestive heart failure) ([place])   . Colitis, ischemic ([place])   . Complication of anesthesia    post anesthesia excessive somnulence  . Compression fracture of C-spine ([place]) 10/10/2011   Fell at home, tx at [facility]  . Diarrhea associated with pseudomembranous colitis 6/11-17/2013   [facility]  . Dyslipidemia   . Fall    with non-healing rib fractures  . Glaucoma   . Granuloma annulare   . History of phlebitis   . Hypertension    SEVERE  . Idiopathic thrombocytopenic purpura (ITP) (HCC)   . Lower extremity edema   . Lumbar stenosis 2004   DR. MARK ROY  . Multiple pelvic fractures ([place]) 05/2012   Dr Adaline Sill , [facility]  . Osteoporosis   . Renal disorder    Stage 3 kidney disease    Patient Active Problem List   Diagnosis Date Noted  . Carpal tunnel syndrome of right wrist 05/17/2019  . Pain in right hand 05/15/2019  . Encounter for therapeutic drug monitoring 03/23/2019  . Pacemaker 03/14/2019  . Atrial fibrillation ([place]) 03/14/2019  . Complete heart block ([place]) 11/06/2016  . Palpitations 10/27/2016  . Coronary artery disease 04/29/2016  . Indigestion 09/25/2015  . PCP NOTES >>>>>>>>>>>>>>>>>> 04/09/2015  . *CKD (chronic kidney disease) stage 3, GFR 30-59 ml/min 08/14/2013  . Ectopic atrial rhythm 08/14/2013  . CHF (congestive  heart failure), NYHA class II, chronic, diastolic ([place]) 99991111  . Fractures involving multiple body regions 12/22/2011  . Idiopathic thrombocytopenic purpura ([place]) 09/25/2011  . Edema   . ARTHRALGIA 11/14/2009  . CARDIOMYOPATHY, PRIMARY, DILATED 10/07/2009  . URINARY INCONTINENCE 03/11/2009  . *ANEMIA, PERNICIOUS 05/22/2008  . Takotsubo syndrome 05/22/2008  . SUPRAVENTRICULAR TACHYCARDIA, PAROXYSMAL, HX OF 05/22/2008  . POPLITEAL CYST 11/07/2007  . *Essential hypertension 09/19/2007  . PHLEBITIS 09/19/2007  . ISCHEMIC COLITIS, HX OF 09/19/2007  . 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 *Osteoporosis 08/19/2006  . COLONIC POLYPS, HX  OF 08/19/2006    Past Surgical History:  Procedure Laterality Date  . ABDOMINAL HYSTERECTOMY    . APPENDECTOMY    . BACK SURGERY  10/2004   LS Disc 4-5 no sx.  Marland Kitchen CARDIAC CATHETERIZATION     12/2003  . CATARACT EXTRACTION, BILATERAL    . COLONOSCOPY  09/2005   adenoma  . COLONOSCOPY W/ POLYPECTOMY    . 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 PACEMAKER IMPLANT N/A 11/06/2016   Procedure: St Jude Pacemaker Implant;  Surgeon: Evans Lance, MD;  Location: [facility];  Service: Cardiovascular;  Laterality: N/A;  . rectal bleed  05-01-06   colonoscopy-ischemic colitis  . ROTATOR CUFF REPAIR    . VERTEBROPLASTY     Dr Gladstone Lighter     OB History   No obstetric history on file.     Family History  Problem Relation Age of Onset  . Coronary artery disease Mother   . Heart failure Mother   . Renal cancer Mother   . Kidney disease Mother   . Stroke Father        in his 28s  . Colon cancer Father   . Breast cancer Sister         X 2  . Rectal cancer Sister   . Breast cancer Sister   . Diabetes Neg Hx     Social History   Tobacco Use  . Smoking status: Never Smoker  . Smokeless tobacco: Never Used  Substance Use Topics  . Alcohol use: No  . Drug use: No    Home Medications Prior to Admission medications   Medication Sig Start Date End Date Taking? Authorizing Provider  beta carotene w/minerals (OCUVITE) tablet Take 1 tablet by mouth daily.     [provider]  brimonidine (ALPHAGAN) 0.2 % ophthalmic solution Place 1 drop into the right eye 2 (two) times daily.      [provider]  Calcium Carbonate-Vit D-Min (CALCIUM 1200 PO) Take 1,200 mg by mouth daily with breakfast.    [provider]  carboxymethylcellulose (REFRESH TEARS) 0.5 % SOLN Place 1 drop into both eyes 2 (two) times daily.     [provider]  carvedilol (COREG) 12.5 MG tablet Take 0.5 tablets (6.25 mg total) by mouth 2 (two) times daily with a meal. 08/08/19   Colon Branch, MD  Cholecalciferol (VITAMIN D3) 3000 UNITS TABS Take 3,000 Units by mouth daily.     [provider]  diclofenac Sodium (VOLTAREN) 1 % GEL Apply 2 g topically 4 (four) times daily as needed. 08/08/19   Colon Branch, MD  diphenhydrAMINE (BENADRYL) 25 MG tablet Take 25 mg by mouth at bedtime as needed for sleep (and sinus issues).     [provider]  docusate sodium (COLACE) 100 MG capsule Take 100 mg by mouth daily as needed for mild constipation. Stool softener  [provider]  Misc Natural Products (OSTEO BI-FLEX ADV JOINT SHIELD) TABS Take 1 tablet by mouth daily.     [provider]  omeprazole (PRILOSEC) 20 MG capsule Take 20 mg by mouth daily.      [provider]  potassium chloride SA (KLOR-CON) 20 MEQ tablet Take 1 tablet (20 mEq total) by mouth 2 (two) times daily. 08/08/19   Colon Branch, MD  timolol (TIMOPTIC) 0.5 % ophthalmic solution Place 1 drop into the right eye 2 (two) times daily. 05/31/18   [provider]  torsemide (DEMADEX) 20 MG tablet Take 4 tablets (80 mg total) by mouth 2 (two) times daily. May take 1 extra tab for weight gain of 3lbs in 24 hours Patient taking differently: Take 5 tabs in AM and 5 tabs in PM. 07/20/19   Rosita Fire, Wilmer Floor, PA-C  vitamin B-12 (CYANOCOBALAMIN) 1000 MCG tablet Take 1,000 mcg by mouth 2 (two) times daily.     [provider]  warfarin (COUMADIN) 3 MG tablet Take 1 tablet (3 mg total) by mouth daily. 03/14/19   Evans Lance, MD    Allergies    Morphine and related, Oxycodone, Penicillins, Clindamycin/lincomycin, Colchicine, Norvasc [amlodipine besylate], Ofloxacin, Prednisone, Streptomycin, Tape, Tramadol, Alendronate sodium, Amiodarone, and Plaquenil [hydroxychloroquine sulfate]  Review of Systems   Review of Systems  All other systems reviewed and are negative.   Physical Exam Updated Vital Signs BP 127/75   Pulse 95   Resp 14   SpO2 99%   Physical  Exam Vitals and nursing note reviewed.  Constitutional:      General: She is not in acute distress.    Appearance: She is well-developed.     Comments: Elderly female resting in bed in no acute discomfort.  HENT:     Head: Atraumatic.  Eyes:     Conjunctiva/sclera: Conjunctivae normal.  Neck:     Comments: Faint JVD Cardiovascular:     Rate and Rhythm: Tachycardia present. Rhythm irregular.     Heart sounds: No murmur.  Pulmonary:     Effort: Pulmonary effort is normal.     Breath sounds: Normal breath sounds. No wheezing, rhonchi or rales.  Abdominal:     Palpations: Abdomen is soft.  Musculoskeletal:        General: Swelling (Bilateral lower extremities: 2+ pedal edema extending towards mid thigh with intact pedal pulses.  Leg compartments soft no significant erythema or warmth.) present.     Cervical back: Neck supple.  Skin:    Findings: No rash.  Neurological:     Mental Status: She is alert and oriented to person, place, and time.  Psychiatric:        Mood and Affect: Mood normal.     ED Results / Procedures / Treatments   Labs (all labs ordered are listed, but only abnormal results are displayed) Labs Reviewed  BASIC METABOLIC PANEL - Abnormal; Notable for the following components:      Result Value   Chloride 97 (*)    Glucose, Bld 103 (*)    BUN 67 (*)    Creatinine, Ser 2.78 (*)    GFR calc non Af Amer 14 (*)    GFR calc Af Amer 16 (*)    All other components within normal limits  CBC WITH DIFFERENTIAL/PLATELET - Abnormal; Notable for the following components:   RBC 3.35 (*)    Hemoglobin 10.6 (*)    HCT 32.8 (*)    Platelets 37 (*)  Abs Immature Granulocytes 0.09 (*)    All other components within normal limits  BRAIN NATRIURETIC PEPTIDE - Abnormal; Notable for the following components:   B Natriuretic Peptide 258.7 (*)    All other components within normal limits  PROTIME-INR - Abnormal; Notable for the following components:   Prothrombin Time  42.6 (*)    INR 4.5 (*)    All other components within normal limits  SARS CORONAVIRUS 2 (TAT 6-24 HRS)    EKG None  ED ECG REPORT   Date: 09/06/2019  Rate: 98  Rhythm: atrial fibrillation  QRS Axis: normal  Intervals: normal  ST/T Wave abnormalities: normal  Conduction Disutrbances:none  Narrative Interpretation:   Old EKG Reviewed: unchanged  I have personally reviewed the EKG tracing and agree with the computerized printout as noted.   Radiology DG Chest Port 1 View  Result Date: 09/06/2019 CLINICAL DATA:  Lower extremity edema. History of congestive heart failure EXAM: PORTABLE CHEST 1 VIEW COMPARISON:  June 16, 2018. FINDINGS: There is mild scarring in the right base. There is no edema or airspace opacity. Heart is upper normal in size with pulmonary vascularity normal. Pacemaker leads are attached to the right atrium and right ventricle. There is aortic atherosclerosis. No adenopathy. There old healed rib fractures on the right. There is a focal hiatal hernia. There is arthropathy in each shoulder. IMPRESSION: Scarring right base. No edema or airspace opacity. Stable cardiac silhouette. Pacemaker leads attached to right atrium and right ventricle. Aortic Atherosclerosis (ICD10-I70.0). Hiatal hernia present. Evidence of arthropathy in the shoulders and old rib trauma on the right. Electronically Signed   By: Lowella Grip III M.D.   On: 09/06/2019 14:56    Procedures Procedures (including critical care time)  Medications Ordered in ED Medications  furosemide (LASIX) injection 80 mg (80 mg Intravenous Given 09/06/19 1608)    ED Course  I have reviewed the triage vital signs and the nursing notes.  Pertinent labs & imaging results that were available during my care of the patient were reviewed by me and considered in my medical decision making (see chart for details).    MDM Rules/Calculators/A&P                      BP 127/75   Pulse 95   Resp 14   SpO2 99%    Final Clinical Impression(s) / ED Diagnoses Final diagnoses:  Bilateral lower extremity edema  Acute on chronic congestive heart failure, unspecified heart failure type ([place])    Rx / DC Orders ED Discharge Orders    None     4:39 PM Patient with history of CHF who is here due to progressive worsening bilateral leg swelling now having difficulty ambulating due to pain and swelling.  Was seen at the heart failure clinic yesterday for her complaint and per prior note and have for review, patient is supposed to receive Lasix 80 mg IV for 2 days and then resume her normal course of treatment.  She is here due to concerns that she would likely fall and injure herself if she has to use the bathroom on a frequent basis after receiving IV Lasix.  She lives at home by herself.  She does have significant edema involving her lower extremities however she does not have any significant shortness of breath and no evidence concerning for DVT or cellulitis.  Evidence of impaired renal functions with BUN 67, creatinine 2.78 similar to baseline.  BNP is 258.7.  She is supratherapeutic with INR of 4.5.  Chest x-ray today without edema or airspace opacity.  EKG unremarkable.  Appreciate consultation from [facility] hospitalist, Dr. Roosevelt Locks who agrees to admit patient for ops in order for her to receive IV Lasix as treatment of her peripheral edema.  COVID-19 screening test ordered.   The cardiac monitor revealed atrial fibrillation as interpreted by me. The cardiac monitor was ordered secondary to the patient's history of a-fib and to monitor the patient for dysrhythmia   [name] was evaluated in Emergency Department on 09/06/2019 for the symptoms described in the history of present illness. She was evaluated in the context of the global COVID-19 pandemic, which necessitated consideration that the patient might be at risk for infection with the SARS-CoV-2 virus that causes COVID-19. Institutional protocols and  algorithms that pertain to the evaluation of patients at risk for COVID-19 are in a state of rapid change based on information released by regulatory bodies including the CDC and federal and state organizations. These policies and algorithms were followed during the patient's care in the ED.    Domenic Moras, PA-C 09/06/19 1644    Charlesetta Shanks, MD 09/08/19 [phone]

## 2019-09-06 NOTE — ED Triage Notes (Signed)
Per [facility]: Pt from home, family states pt take "fluid pills and her legs are swollen". Pt is in part of the heart failure clinic. No difficulty breathing. Pt here to "get fluid pulled off". Home nurse wanted pt transported to ED.

## 2019-09-06 NOTE — ED Notes (Signed)
Pt transported to US

## 2019-09-06 NOTE — Patient Instructions (Addendum)
  Description   Skip today's dosage of Coumadin, then per Jackelyn Poling RN with [facility] pt is going to the hospital to be admitted for further diuresis.  Will await hospital discharge to schedule pt follow-up.  Previous dosage regimen is 1/2 a tablet daily, except for 1 tablet on Mondays, Wednesdays and Fridays. Continue your protein drinks 1-2 a week. Recheck INR in 1 weeks. Call the coumadin Clinic if for any changes in medication or upcoming procedures. Coumadin Clinic [phone] Main [phone]

## 2019-09-07 DIAGNOSIS — L8990 Pressure ulcer of unspecified site, unspecified stage: Secondary | ICD-10-CM | POA: Insufficient documentation

## 2019-09-07 LAB — BASIC METABOLIC PANEL
Anion gap: 12 (ref 5–15)
BUN: 63 mg/dL — ABNORMAL HIGH (ref 8–23)
CO2: 29 mmol/L (ref 22–32)
Calcium: 9.2 mg/dL (ref 8.9–10.3)
Chloride: 97 mmol/L — ABNORMAL LOW (ref 98–111)
Creatinine, Ser: 2.38 mg/dL — ABNORMAL HIGH (ref 0.44–1.00)
GFR calc Af Amer: 20 mL/min — ABNORMAL LOW (ref >60)
GFR calc non Af Amer: 17 mL/min — ABNORMAL LOW (ref >60)
Glucose, Bld: 79 mg/dL (ref 70–99)
Potassium: 3.9 mmol/L (ref 3.5–5.1)
Sodium: 138 mmol/L (ref 135–145)

## 2019-09-07 LAB — CBC
HCT: 28.2 % — ABNORMAL LOW (ref 36.0–46.0)
Hemoglobin: 9.2 g/dL — ABNORMAL LOW (ref 12.0–15.0)
MCH: 31.7 pg (ref 26.0–34.0)
MCHC: 32.6 g/dL (ref 30.0–36.0)
MCV: 97.2 fL (ref 80.0–100.0)
Platelets: UNDETERMINED 10*3/uL (ref 150–400)
RBC: 2.9 MIL/uL — ABNORMAL LOW (ref 3.87–5.11)
RDW: 15 % (ref 11.5–15.5)
WBC: 6 10*3/uL (ref 4.0–10.5)
nRBC: 0 % (ref 0.0–0.2)

## 2019-09-07 LAB — TSH: TSH: 2.949 u[IU]/mL (ref 0.350–4.500)

## 2019-09-07 LAB — PROTIME-INR
INR: 5.1 (ref 0.8–1.2)
Prothrombin Time: 47.6 seconds — ABNORMAL HIGH (ref 11.4–15.2)

## 2019-09-07 LAB — MAGNESIUM: Magnesium: 2 mg/dL (ref 1.7–2.4)

## 2019-09-07 NOTE — Progress Notes (Signed)
PROGRESS NOTE    [name]  [id number]:[id number] DOB: 1923/03/08 DOA: 09/06/2019 PCP: Colon Branch, MD   Brief Narrative:   86 year old lady with prior h/o NICM, hypertension, permanent atrial fibrillation with tachybradycardia syndrome s/p pacemaker placement, chronic diastolic heart failure, NSTEMI presents to ED for worsening shortness of breath.  He was admitted to [facility] for acute on chronic diastolic heart failure.  Cardiology consulted for management of CHF.  Assessment & Plan:   Principal Problem:   Acute on chronic diastolic (congestive) heart failure (HCC) Active Problems:   *Essential hypertension   CKD (chronic kidney disease) stage 4, GFR 15-29 ml/min (HCC)   AKI (acute kidney injury) (HCC)   Complete heart block (HCC)   Pacemaker   Chronic atrial fibrillation (HCC)   Chronic anticoagulation   Supratherapeutic INR   Pressure injury of skin  Acute on chronic diastolic heart failure Admitted to telemetry and started the patient on IV Lasix 80 mg twice daily.  Last echocardiogram in November 2020 showed left ventricular ejection fraction of 55 to 60%. Chest x-ray does not show any pulmonary edema at this time.  Appreciate cardiology input Continue with daily weights, strict intake and output and fluid restriction.  Patient had Unna boots placed yesterday but she requested to be taken off the Unna boots as she reports numbness of her feet.    History of coronary artery disease Patient denies any chest pain at this time.    Chronic atrial fibrillation, tachybradycardia syndrome s/p pacemaker in 2018 Rate controlled with Coreg. Patient is on Coumadin at home currently has supratherapeutic INR of 5. No obvious signs of bleeding.    Essential hypertension Blood pressure parameters are soft this afternoon.  Continue to monitor.  Stage IV CKD Creatinine at 2.38 and improving.    Mild normocytic anemia Hemoglobin at 10.6.  Continue to monitor       Pressure  injury present on admission. Pressure Injury 09/06/19 Buttocks Right;Left Stage 2 -  Partial thickness loss of dermis presenting as a shallow open injury with a red, pink wound bed without slough. (Active)  09/06/19 1815  Location: Buttocks  Location Orientation: Right;Left  Staging: Stage 2 -  Partial thickness loss of dermis presenting as a shallow open injury with a red, pink wound bed without slough.  Wound Description (Comments):   Present on Admission: Yes    Chronic thrombocytopenia Platelets are 37,000.  No obvious signs of bleeding.  Continue to monitor     DVT prophylaxis: (Lovenox/Heparin/SCD's/anticoagulated/None (if comfort care) Code Status: (Full/Partial - specify details) Family Communication: (Specify name, relationship & date discussed. NO "discussed with patient") Disposition Plan:  . Patient came from:            . Anticipated d/c place: . Barriers to d/c OR conditions which need to be met to effect a safe d/c:   Consultants:   Cardiology   Procedures: none.   Antimicrobials: None.   Subjective: No chest pain. Sob improved.  No nausea or vomiting.   Objective: Vitals:   09/07/19 0531 09/07/19 0723 09/07/19 0747 09/07/19 1155  BP: (!) 99/59 125/89  (!) 93/58  Pulse: 86 74  72  Resp: 19   18  Temp: 97.9 F (36.6 C) 97.7 F (36.5 C)  97.6 F (36.4 C)  TempSrc: Oral Oral  Oral  SpO2: 98% 98%  96%  Weight: 67 kg  65.4 kg   Height:        Intake/Output Summary (Last 24  hours) at 09/07/2019 1455 Last data filed at 09/07/2019 1300 Gross per 24 hour  Intake 940 ml  Output 2750 ml  Net -1810 ml   Filed Weights   09/06/19 1813 09/07/19 0531 09/07/19 0747  Weight: 67 kg 67 kg 65.4 kg    Examination:  General exam: Frail elderly lady, appears calm and comfortable, not in any kind of distress Respiratory system: Clear to auscultation. Respiratory effort normal. Cardiovascular system: S1 & S2 heard, irregularly irregular, no  murmurs Gastrointestinal system: Abdomen is nondistended, soft and non tender. Normal bowel sounds heard. Central nervous system: Alert and oriented. No focal neurological deficits. Extremities: Bilateral lower extremities wrapped in Unna boots Skin: stage 2 buttock pressure ulcer Psychiatry: Mood & affect appropriate.     Data Reviewed: I have personally reviewed following labs and imaging studies  CBC: Recent Labs  Lab 09/06/19 1413  WBC 9.0  NEUTROABS 7.0  HGB 10.6*  HCT 32.8*  MCV 97.9  PLT 37*   Basic Metabolic Panel: Recent Labs  Lab 09/05/19 1612 09/06/19 1413 09/07/19 0457  NA 136 138 138  K 4.8 4.4 3.9  CL 98 97* 97*  CO2 _0 GLUCOSE 98 103* 79  BUN 73* 67* 63*  CREATININE 2.76* 2.78* 2.38*  CALCIUM 9.5 9.7 9.2  MG  --   --  2.0   GFR: Estimated Creatinine Clearance: 12.5 mL/min (A) (by C-G formula based on SCr of 2.38 mg/dL (H)). Liver Function Tests: Recent Labs  Lab 09/06/19 1831  AST 18  ALT 12  ALKPHOS 99  BILITOT 1.1  PROT 6.4*  ALBUMIN 2.8*   No results for input(s): LIPASE, AMYLASE in the last 168 hours. No results for input(s): AMMONIA in the last 168 hours. Coagulation Profile: Recent Labs  Lab 09/05/19 1612 09/06/19 1413 09/07/19 0922  INR 4.2* 4.5* 5.1*   Cardiac Enzymes: No results for input(s): CKTOTAL, CKMB, CKMBINDEX, TROPONINI in the last 168 hours. BNP (last 3 results) Recent Labs    03/01/19 1517  PROBNP 479.0*   HbA1C: No results for input(s): HGBA1C in the last 72 hours. CBG: No results for input(s): GLUCAP in the last 168 hours. Lipid Profile: No results for input(s): CHOL, HDL, LDLCALC, TRIG, CHOLHDL, LDLDIRECT in the last 72 hours. Thyroid Function Tests: Recent Labs    09/07/19 0457  TSH 2.949   Anemia Panel: No results for input(s): VITAMINB12, FOLATE, FERRITIN, TIBC, IRON, RETICCTPCT in the last 72 hours. Sepsis Labs: No results for input(s): PROCALCITON, LATICACIDVEN in the last 168  hours.  Recent Results (from the past 240 hour(s))  SARS CORONAVIRUS 2 (TAT 6-24 HRS) Nasopharyngeal Nasopharyngeal Swab     Status: None   Collection Time: 09/06/19  4:14 PM   Specimen: Nasopharyngeal Swab  Result Value Ref Range Status   SARS Coronavirus 2 NEGATIVE NEGATIVE Final    Comment: (NOTE) SARS-CoV-2 target nucleic acids are NOT DETECTED. The SARS-CoV-2 RNA is generally detectable in upper and lower respiratory specimens during the acute phase of infection. Negative results do not preclude SARS-CoV-2 infection, do not rule out co-infections with other pathogens, and should not be used as the sole basis for treatment or other patient management decisions. Negative results must be combined with clinical observations, patient history, and epidemiological information. The expected result is Negative. Fact Sheet for Patients: SugarRoll.be Fact Sheet for Healthcare Providers: https://www.woods-mathews.com/ This test is not yet approved or cleared by the [place] FDA and  has been authorized for detection and/or diagnosis of  SARS-CoV-2 by FDA under an Emergency Use Authorization (EUA). This EUA will remain  in effect (meaning this test can be used) for the duration of the COVID-19 declaration under Section 56 4(b)(1) of the Act, 21 U.S.C. section 360bbb-3(b)(1), unless the authorization is terminated or revoked sooner. Performed at [facility], [place] [address] [place], [place] [address]          Radiology Studies: US RENAL  Result Date: 09/06/2019 CLINICAL DATA:  Acute kidney injury. EXAM: RENAL / URINARY TRACT ULTRASOUND COMPLETE COMPARISON:  06/23/2018 FINDINGS: Right Kidney: Renal measurements: 9.3 x 3.5 x 5.3 cm = volume: 90 mL. Increased parenchymal echogenicity with cortical thinning. No mass or hydronephrosis visualized. Left Kidney: Renal measurements: 8.8 x 4.2 x 4.1 cm = volume: 79 mL. Increased parenchymal  echogenicity with cortical thinning. No mass or hydronephrosis visualized. Bladder: Appears normal for degree of bladder distention. Other: Enlarged spleen measuring 13.8 cm in length with a volume of 570 cc. IMPRESSION: 1. Medical renal disease.  No hydronephrosis. 2. Splenomegaly. Electronically Signed   By: Logan Bores M.D.   On: 09/06/2019 17:49   DG Chest Port 1 View  Result Date: 09/06/2019 CLINICAL DATA:  Lower extremity edema. History of congestive heart failure EXAM: PORTABLE CHEST 1 VIEW COMPARISON:  June 16, 2018. FINDINGS: There is mild scarring in the right base. There is no edema or airspace opacity. Heart is upper normal in size with pulmonary vascularity normal. Pacemaker leads are attached to the right atrium and right ventricle. There is aortic atherosclerosis. No adenopathy. There old healed rib fractures on the right. There is a focal hiatal hernia. There is arthropathy in each shoulder. IMPRESSION: Scarring right base. No edema or airspace opacity. Stable cardiac silhouette. Pacemaker leads attached to right atrium and right ventricle. Aortic Atherosclerosis (ICD10-I70.0). Hiatal hernia present. Evidence of arthropathy in the shoulders and old rib trauma on the right. Electronically Signed   By: Lowella Grip III M.D.   On: 09/06/2019 14:56        Scheduled Meds: . brimonidine  1 drop Right Eye BID  . carvedilol  6.25 mg Oral BID WC  . furosemide  80 mg Intravenous Daily  . hydroxypropyl methylcellulose / hypromellose  1 drop Both Eyes BID  . multivitamin  1 tablet Oral Daily  . pantoprazole  40 mg Oral Daily  . sodium chloride flush  3 mL Intravenous Q12H  . timolol  1 drop Right Eye BID  . vitamin B-12  1,000 mcg Oral BID  . Warfarin - Pharmacist Dosing Inpatient   Does not apply q1800   Continuous Infusions: . sodium chloride       LOS: 1 day     Hosie Poisson, MD [facility]   To contact the attending provider between 7A-7P or the covering  provider during after hours 7P-7A, please log into the web site [email or web address] and access using universal [place] password for that web site. If you do not have the password, please call the hospital operator.  09/07/2019, 2:55 PM

## 2019-09-07 NOTE — Care Management (Signed)
Per Andrey Farmer. W/[facility] [phone]  Co-pay for Eliquis 2.107m. and 559m Bid 30 day supply $35.00. 90 day supply $105.00 retail and mail order.  No- PA required Deductible not met. Tier 3 Pharmarcy : [facility].  Ref. [id number]

## 2019-09-07 NOTE — TOC Progression Note (Addendum)
Transition of Care [facility]) - Progression Note    Patient Details  Name: CINTYA FORGIE MRN: [id number] Date of Birth: 1922-08-09  [facility]) CM/SW Contact  Zenon Mayo, RN Phone Number: 09/07/2019, 3:59 PM  Clinical Narrative:    Patient states she lives alone, she has a lift chair, she states she has someone that comes by once a month to help clean her house. She states her son and her neighbors go to mailbox to get her mail for her.  She states she is active with [facility] for RN and OT.  She would like to continue with [facility].  Cory with [facility] notified, he is checking to see if she is till active with [facility]. Will need HHRN, HHPT, HHOT orders prior to dc. Benefit check for eliquis is in process also.   Cory with [facility] confirmed she is active with them for RN, and OT, will need to add PT.         Expected Discharge Plan and Services                                                 Social Determinants of Health (SDOH) Interventions    Readmission Risk Interventions No flowsheet data found.

## 2019-09-07 NOTE — Plan of Care (Signed)
?  Problem: Education: ?Goal: Ability to demonstrate management of disease process will improve ?Outcome: Progressing ?Goal: Ability to verbalize understanding of medication therapies will improve ?Outcome: Progressing ?Goal: Individualized Educational Video(s) ?Outcome: Progressing ?  ?Problem: Activity: ?Goal: Capacity to carry out activities will improve ?Outcome: Progressing ?  ?Problem: Cardiac: ?Goal: Ability to achieve and maintain adequate cardiopulmonary perfusion will improve ?Outcome: Progressing ?  ?Problem: Education: ?Goal: Knowledge of General Education information will improve ?Description: Including pain rating scale, medication(s)/side effects and non-pharmacologic comfort measures ?Outcome: Progressing ?  ?Problem: Health Behavior/Discharge Planning: ?Goal: Ability to manage health-related needs will improve ?Outcome: Progressing ?  ?Problem: Clinical Measurements: ?Goal: Ability to maintain clinical measurements within normal limits will improve ?Outcome: Progressing ?Goal: Will remain free from infection ?Outcome: Progressing ?Goal: Diagnostic test results will improve ?Outcome: Progressing ?Goal: Respiratory complications will improve ?Outcome: Progressing ?Goal: Cardiovascular complication will be avoided ?Outcome: Progressing ?  ?Problem: Nutrition: ?Goal: Adequate nutrition will be maintained ?Outcome: Progressing ?  ?Problem: Coping: ?Goal: Level of anxiety will decrease ?Outcome: Progressing ?  ?Problem: Elimination: ?Goal: Will not experience complications related to bowel motility ?Outcome: Progressing ?Goal: Will not experience complications related to urinary retention ?Outcome: Progressing ?  ?Problem: Pain Managment: ?Goal: General experience of comfort will improve ?Outcome: Progressing ?  ?Problem: Safety: ?Goal: Ability to remain free from injury will improve ?Outcome: Progressing ?  ?Problem: Skin Integrity: ?Goal: Risk for impaired skin integrity will decrease ?Outcome:  Progressing ?  ?

## 2019-09-07 NOTE — Progress Notes (Signed)
[place] for warfarin Indication: atrial fibrillation  Allergies  Allergen Reactions  . Morphine And Related Anaphylaxis and Nausea Only    Per family, made her violently ill  . Oxycodone Other (See Comments)    Dizziness   . Penicillins Swelling and Other (See Comments)    Joint edema Has patient had a PCN reaction causing immediate rash, facial/tongue/throat swelling, SOB or lightheadedness with hypotension: Yes Has patient had a PCN reaction causing severe rash involving mucus membranes or skin necrosis: No Has patient had a PCN reaction that required hospitalization: No Has patient had a PCN reaction occurring within the last 10 years: No If all of the above answers are "NO", then may proceed with Cephalosporin use.   . Clindamycin/Lincomycin Hives and Itching  . Colchicine Other (See Comments)    Hair loss  . Norvasc [Amlodipine Besylate] Other (See Comments)    Reaction not recalled by the patient ??  . Ofloxacin Other (See Comments)    Unknown   . Prednisone Other (See Comments)    Blurred vision  . Streptomycin Other (See Comments)    "was a long time ago" reaction not recalled  . Tape Other (See Comments)    SKIN IS VERY THIN AND WILL TEAR AND BRUISE EASILY!!  . Tramadol Itching  . Alendronate Sodium Palpitations  . Amiodarone Rash and Other (See Comments)    Possible rash and nervousness per patient  . Plaquenil [Hydroxychloroquine Sulfate] Rash    Patient Measurements: Height: 5\' 4"  (162.6 cm) Weight: 144 lb 1.6 oz (65.4 kg) IBW/kg (Calculated) : 54.7 Heparin Dosing Weight:   Vital Signs: Temp: 97.7 F (36.5 C) (02/25 0723) Temp Source: Oral (02/25 0723) BP: 125/89 (02/25 0723) Pulse Rate: 74 (02/25 0723)  Labs: Recent Labs    09/05/19 1612 09/06/19 1413 09/07/19 0457 09/07/19 0922  HGB  --  10.6*  --   --   HCT  --  32.8*  --   --   PLT  --  37*  --   --   LABPROT 40.8* 42.6*  --  47.6*  INR 4.2* 4.5*   --  5.1*  CREATININE 2.76* 2.78* 2.38*  --     Estimated Creatinine Clearance: 12.5 mL/min (A) (by C-G formula based on SCr of 2.38 mg/dL (H)).   Medical History: Past Medical History:  Diagnosis Date  . Atrial fibrillation ([place])   . AV block, 1st degree   . CAD (coronary artery disease)    Non ST elevation 2005 ; [facility] in 6/05 in setting of NSTEMI: ant apical AK and inf-apical AK, EF 29%, dOM2 70-80% (small); findings c/w Tako-Tsubo CM  . Cardiomyopathy, nonischemic ([place]) 04/2008   Likely Tako-Tsubo. Resolved. --dx'd on cath 2005. EF 29% with minimal distal CAD (small OM1 70-80).  Echo 10/09 EF 55%;  echo 01/26/12: EF 55%, mild LAE, PASP 34.  Marland Kitchen CHB (complete heart block) (HCC)    Intermittent, s/p Saint Jude PPM  . CHF (congestive heart failure) ([place])   . Colitis, ischemic ([place])   . Complication of anesthesia    post anesthesia excessive somnulence  . Compression fracture of C-spine ([place]) 10/10/2011   Fell at home, tx at [facility]  . Diarrhea associated with pseudomembranous colitis 6/11-17/2013   [facility]  . Dyslipidemia   . Fall    with non-healing rib fractures  . Glaucoma   . Granuloma annulare   . History of phlebitis   . Hypertension    SEVERE  .  Idiopathic thrombocytopenic purpura (ITP) (HCC)   . Lower extremity edema   . Lumbar stenosis 2004   DR. MARK ROY  . Multiple pelvic fractures ([place]) 05/2012   Dr Adaline Sill , [facility]  . Osteoporosis   . Renal disorder    Stage 3 kidney disease    Medications:  Scheduled:  . brimonidine  1 drop Right Eye BID  . carvedilol  6.25 mg Oral BID WC  . furosemide  80 mg Intravenous Daily  . hydroxypropyl methylcellulose / hypromellose  1 drop Both Eyes BID  . multivitamin  1 tablet Oral Daily  . pantoprazole  40 mg Oral Daily  . sodium chloride flush  3 mL Intravenous Q12H  . timolol  1 drop Right Eye BID  . vitamin B-12  1,000 mcg Oral BID  . Warfarin - Pharmacist Dosing Inpatient   Does not apply q1800   Infusions:  .  sodium chloride      Assessment: [name] on warfarin PTA for AFib admitted with volume overload. INR 4.5 on admit, no active bleeding, H/H stable. Note recent dose adjustment for elevated INRs outpatient.  INR remains elevated at 5.1 this morning, no S/Sx bleeding per nursing.  *Home dose = 3mg  MWF, 1.5mg  TTSS  Goal of Therapy:  INR 2-3 Monitor platelets by anticoagulation protocol: Yes   Plan:  -Hold warfarin again tonight -Watch closely for S/Sx bleeding - CBC tomorrow   Arrie Senate, PharmD, BCPS Clinical Pharmacist [phone] Please check AMION for all [place] numbers 09/07/2019

## 2019-09-07 NOTE — Progress Notes (Signed)
Ace wrap removed from legs bilaterally MD Dr.Akula gave permission for them to be removed patient c/o legs numb,cramping and hurting since they were put on she requested they be removed.

## 2019-09-07 NOTE — Progress Notes (Addendum)
Advanced Heart Failure Rounding Note  PCP-Cardiologist: Glori Bickers, MD   Subjective:    Admitted with marked volume overload and started on IV lasix. Weight unchanged but negative 2 liters.   Creatinine trending down 2.8>2.4.    Denies SOB. Denies chest pain.    Objective:   Weight Range: 67 kg Body mass index is 25.35 kg/m.   Vital Signs:   Temp:  [97.5 F (36.4 C)-97.9 F (36.6 C)] 97.7 F (36.5 C) (02/25 0723) Pulse Rate:  [74-102] 74 (02/25 0723) Resp:  [12-19] 19 (02/25 0531) BP: (99-145)/(59-97) 125/89 (02/25 0723) SpO2:  [96 %-100 %] 98 % (02/25 0723) Weight:  [67 kg] 67 kg (02/25 0531) Last BM Date: 09/05/19  Weight change: Filed Weights   09/06/19 1813 09/07/19 0531  Weight: 67 kg 67 kg    Intake/Output:   Intake/Output Summary (Last 24 hours) at 09/07/2019 0725 Last data filed at 09/07/2019 0430 Gross per 24 hour  Intake 240 ml  Output 2250 ml  Net -2010 ml      Physical Exam    General:  In bed.  No resp difficulty HEENT: Normal Neck: Supple. JVP to jaw  . Carotids 2+ bilat; no bruits. No lymphadenopathy or thyromegaly appreciated. Cor: PMI nondisplaced. Irregular rate & rhythm. No rubs, gallops or murmurs. Lungs: Clear Abdomen: Soft, nontender, nondistended. No hepatosplenomegaly. No bruits or masses. Good bowel sounds. Extremities: No cyanosis, clubbing, rash, R and LLE unna boots  Neuro: Alert & orientedx3, cranial nerves grossly intact. moves all 4 extremities w/o difficulty. Affect pleasant   Telemetry   A fib   EKG   n/a  Labs    CBC Recent Labs    09/06/19 1413  WBC 9.0  NEUTROABS 7.0  HGB 10.6*  HCT 32.8*  MCV 97.9  PLT 37*   Basic Metabolic Panel Recent Labs    09/06/19 1413 09/07/19 0457  NA 138 138  K 4.4 3.9  CL 97* 97*  CO2 26 29  GLUCOSE 103* 79  BUN 67* 63*  CREATININE 2.78* 2.38*  CALCIUM 9.7 9.2  MG  --  2.0   Liver Function Tests Recent Labs    09/06/19 1831  AST 18  ALT 12    ALKPHOS 99  BILITOT 1.1  PROT 6.4*  ALBUMIN 2.8*   No results for input(s): LIPASE, AMYLASE in the last 72 hours. Cardiac Enzymes No results for input(s): CKTOTAL, CKMB, CKMBINDEX, TROPONINI in the last 72 hours.  BNP: BNP (last 3 results) Recent Labs    08/09/19 1420 09/05/19 1612 09/06/19 1413  BNP 259.6* 374.0* 258.7*    ProBNP (last 3 results) Recent Labs    03/01/19 1517  PROBNP 479.0*     D-Dimer No results for input(s): DDIMER in the last 72 hours. Hemoglobin A1C No results for input(s): HGBA1C in the last 72 hours. Fasting Lipid Panel No results for input(s): CHOL, HDL, LDLCALC, TRIG, CHOLHDL, LDLDIRECT in the last 72 hours. Thyroid Function Tests Recent Labs    09/07/19 0457  TSH 2.949    Other results:   Imaging    US RENAL  Result Date: 09/06/2019 CLINICAL DATA:  Acute kidney injury. EXAM: RENAL / URINARY TRACT ULTRASOUND COMPLETE COMPARISON:  06/23/2018 FINDINGS: Right Kidney: Renal measurements: 9.3 x 3.5 x 5.3 cm = volume: 90 mL. Increased parenchymal echogenicity with cortical thinning. No mass or hydronephrosis visualized. Left Kidney: Renal measurements: 8.8 x 4.2 x 4.1 cm = volume: 79 mL. Increased parenchymal echogenicity with cortical thinning.  No mass or hydronephrosis visualized. Bladder: Appears normal for degree of bladder distention. Other: Enlarged spleen measuring 13.8 cm in length with a volume of 570 cc. IMPRESSION: 1. Medical renal disease.  No hydronephrosis. 2. Splenomegaly. Electronically Signed   By: Logan Bores M.D.   On: 09/06/2019 17:49   DG Chest Port 1 View  Result Date: 09/06/2019 CLINICAL DATA:  Lower extremity edema. History of congestive heart failure EXAM: PORTABLE CHEST 1 VIEW COMPARISON:  June 16, 2018. FINDINGS: There is mild scarring in the right base. There is no edema or airspace opacity. Heart is upper normal in size with pulmonary vascularity normal. Pacemaker leads are attached to the right atrium and right  ventricle. There is aortic atherosclerosis. No adenopathy. There old healed rib fractures on the right. There is a focal hiatal hernia. There is arthropathy in each shoulder. IMPRESSION: Scarring right base. No edema or airspace opacity. Stable cardiac silhouette. Pacemaker leads attached to right atrium and right ventricle. Aortic Atherosclerosis (ICD10-I70.0). Hiatal hernia present. Evidence of arthropathy in the shoulders and old rib trauma on the right. Electronically Signed   By: Lowella Grip III M.D.   On: 09/06/2019 14:56      Medications:     Scheduled Medications: . brimonidine  1 drop Right Eye BID  . carvedilol  6.25 mg Oral BID WC  . furosemide  80 mg Intravenous Daily  . hydroxypropyl methylcellulose / hypromellose  1 drop Both Eyes BID  . multivitamin  1 tablet Oral Daily  . pantoprazole  40 mg Oral Daily  . sodium chloride flush  3 mL Intravenous Q12H  . timolol  1 drop Right Eye BID  . vitamin B-12  1,000 mcg Oral BID  . Warfarin - Pharmacist Dosing Inpatient   Does not apply q1800     Infusions: . sodium chloride       PRN Medications:  sodium chloride, acetaminophen, docusate sodium, ondansetron (ZOFRAN) IV, sodium chloride flush     Assessment/Plan   1.Acute onChronic diastolic [id number] of Takotsubo cardiomyopathy, EF has recovered. - Echo 05/2019 EF 55-60%, RV mildly reduced  - R>>L HF symptoms, mainly systemic congestion. CXR w/o overt edema  - Remains volume overloaded.  - Continue IV lasix 80 mg twice a day - Needs daily standing weights.  - Continue unna boots.    2. CAD: -Minimal branch vessel disease on cath - No chest pain  - intolerant of statins - no ASA due to coumadin  3.Permanent AF with Tachybrady syndrome s/p St Jude PPM 11/06/16 -Rate controlled. - Continue coreg 6.25 mg bid - Hold coumadin for supratheaputic INR (4.6) - CHeck INR   4.HTN  - normotensive in ED  - continue home regimen, Coreg 6.25 bid -  monitor pressure w/ diuresis   5. AKI on CKD, Stage IV-V: - baseline SCr ~2.0-2.5 - 2.78 on admit. Trending down today -->2.4    6. Supratherapeutic INR:  - on coumadin for Afib - INR 4.6 (hgb stable at 10.6- baseline), likely 2/2 hepatic congestion from CHF - hold coumadin. Monitor for bleeding  Consult PT Length of Stay: 1  Amy Clegg, NP  09/07/2019, 7:25 AM  Advanced Heart Failure Team Pager [phone] (M-F; [place])  Please contact [facility] for night-coverage after hours (4p -7a ) and weekends on [email or web address]  Patient seen and examined with the above-signed Advanced Practice Provider and/or Housestaff. I personally reviewed laboratory data, imaging studies and relevant notes. I independently examined the patient and formulated the  important aspects of the plan. I have edited the note to reflect any of my changes or salient points. I have personally discussed the plan with the patient and/or family.  Diuresis picking up but remains volume overloaded. Creatinine improving. INR up to 4.6. Coumadin on hold. Remains in chronic AF - rate controlled.   On exam elderly weak NAD JVP to jaw Cor IRR 2+ edema  Continue IV diuresis. Add metolazone as needed. Continue to hold warfarin. INR likely up due to passive liver congestion. Discussed dosing with PharmD personally.  Glori Bickers, MD  8:01 PM

## 2019-09-07 NOTE — Progress Notes (Signed)
CRITICAL VALUE ALERT  Critical Value:  INR 5.1  Date & Time Notied:  09/07/19@1008   Provider Notified: Akula,VIjaya MD  Orders Received/Actions taken: observe patient for any bleeding. Pharmacist informed also as they are in charge of Coumadin dosing. No other orders noted.

## 2019-09-07 NOTE — Plan of Care (Signed)
  Problem: Activity: Goal: Capacity to carry out activities will improve Outcome: Progressing   Problem: Clinical Measurements: Goal: Respiratory complications will improve Outcome: Progressing   Problem: Safety: Goal: Ability to remain free from injury will improve Outcome: Progressing   

## 2019-09-08 LAB — CBC
HCT: 27.8 % — ABNORMAL LOW (ref 36.0–46.0)
Hemoglobin: 9.1 g/dL — ABNORMAL LOW (ref 12.0–15.0)
MCH: 31.2 pg (ref 26.0–34.0)
MCHC: 32.7 g/dL (ref 30.0–36.0)
MCV: 95.2 fL (ref 80.0–100.0)
Platelets: 37 10*3/uL — ABNORMAL LOW (ref 150–400)
RBC: 2.92 MIL/uL — ABNORMAL LOW (ref 3.87–5.11)
RDW: 14.6 % (ref 11.5–15.5)
WBC: 8.1 10*3/uL (ref 4.0–10.5)
nRBC: 0 % (ref 0.0–0.2)

## 2019-09-08 LAB — PROTIME-INR
INR: 1.9 — ABNORMAL HIGH (ref 0.8–1.2)
INR: 3.1 — ABNORMAL HIGH (ref 0.8–1.2)
Prothrombin Time: 21.6 seconds — ABNORMAL HIGH (ref 11.4–15.2)
Prothrombin Time: 32.3 seconds — ABNORMAL HIGH (ref 11.4–15.2)

## 2019-09-08 LAB — BASIC METABOLIC PANEL
Anion gap: 10 (ref 5–15)
BUN: 65 mg/dL — ABNORMAL HIGH (ref 8–23)
CO2: 30 mmol/L (ref 22–32)
Calcium: 8.9 mg/dL (ref 8.9–10.3)
Chloride: 95 mmol/L — ABNORMAL LOW (ref 98–111)
Creatinine, Ser: 2.65 mg/dL — ABNORMAL HIGH (ref 0.44–1.00)
GFR calc Af Amer: 17 mL/min — ABNORMAL LOW (ref >60)
GFR calc non Af Amer: 15 mL/min — ABNORMAL LOW (ref >60)
Glucose, Bld: 106 mg/dL — ABNORMAL HIGH (ref 70–99)
Potassium: 3.9 mmol/L (ref 3.5–5.1)
Sodium: 135 mmol/L (ref 135–145)

## 2019-09-08 MED ORDER — POTASSIUM CHLORIDE CRYS ER 20 MEQ PO TBCR
40.0000 meq | EXTENDED_RELEASE_TABLET | Freq: Once | ORAL | Status: AC
  Administered 2019-09-08: 40 meq via ORAL
  Filled 2019-09-08: qty 2

## 2019-09-08 MED ORDER — WARFARIN SODIUM 1 MG PO TABS
1.0000 mg | ORAL_TABLET | Freq: Once | ORAL | Status: AC
  Administered 2019-09-08: 1 mg via ORAL
  Filled 2019-09-08: qty 1

## 2019-09-08 MED ORDER — METOLAZONE 2.5 MG PO TABS
2.5000 mg | ORAL_TABLET | Freq: Once | ORAL | Status: AC
  Administered 2019-09-08: 2.5 mg via ORAL
  Filled 2019-09-08: qty 1

## 2019-09-08 MED ORDER — FUROSEMIDE 10 MG/ML IJ SOLN
80.0000 mg | Freq: Two times a day (BID) | INTRAMUSCULAR | Status: DC
  Administered 2019-09-08 – 2019-09-11 (×6): 80 mg via INTRAVENOUS
  Filled 2019-09-08 (×7): qty 8

## 2019-09-08 NOTE — Progress Notes (Signed)
[facility] [place] for warfarin Indication: atrial fibrillation  Allergies  Allergen Reactions  . Morphine And Related Anaphylaxis and Nausea Only    Per family, made her violently ill  . Oxycodone Other (See Comments)    Dizziness   . Penicillins Swelling and Other (See Comments)    Joint edema Has patient had a PCN reaction causing immediate rash, facial/tongue/throat swelling, SOB or lightheadedness with hypotension: Yes Has patient had a PCN reaction causing severe rash involving mucus membranes or skin necrosis: No Has patient had a PCN reaction that required hospitalization: No Has patient had a PCN reaction occurring within the last 10 years: No If all of the above answers are "NO", then may proceed with Cephalosporin use.   . Clindamycin/Lincomycin Hives and Itching  . Colchicine Other (See Comments)    Hair loss  . Norvasc [Amlodipine Besylate] Other (See Comments)    Reaction not recalled by the patient ??  . Ofloxacin Other (See Comments)    Unknown   . Prednisone Other (See Comments)    Blurred vision  . Streptomycin Other (See Comments)    "was a long time ago" reaction not recalled  . Tape Other (See Comments)    SKIN IS VERY THIN AND WILL TEAR AND BRUISE EASILY!!  . Tramadol Itching  . Alendronate Sodium Palpitations  . Amiodarone Rash and Other (See Comments)    Possible rash and nervousness per patient  . Plaquenil [Hydroxychloroquine Sulfate] Rash    Patient Measurements: Height: 5\' 4"  (162.6 cm) Weight: 149 lb 6.4 oz (67.8 kg) IBW/kg (Calculated) : 54.7 Heparin Dosing Weight:   Vital Signs: Temp: 97.1 F (36.2 C) (02/26 1208) Temp Source: Oral (02/26 1208) BP: 129/63 (02/26 1208) Pulse Rate: 83 (02/26 1208)  Labs: Recent Labs    09/06/19 1413 09/06/19 1413 09/07/19 0457 09/07/19 0922 09/07/19 1822 09/08/19 0450 09/08/19 1309  HGB 10.6*   < >  --   --  9.2* 9.1*  --   HCT 32.8*  --   --   --  28.2* 27.8*  --    PLT 37*  --   --   --  PLATELET CLUMPS NOTED ON SMEAR, UNABLE TO ESTIMATE 37*  --   LABPROT 42.6*   < >  --  47.6*  --  21.6* 32.3*  INR 4.5*   < >  --  5.1*  --  1.9* 3.1*  CREATININE 2.78*  --  2.38*  --   --  2.65*  --    < > = values in this interval not displayed.    Estimated Creatinine Clearance: 12.3 mL/min (A) (by C-G formula based on SCr of 2.65 mg/dL (H)).   Medical History: Past Medical History:  Diagnosis Date  . Atrial fibrillation ([place])   . AV block, 1st degree   . CAD (coronary artery disease)    Non ST elevation 2005 ; LHC in 6/05 in setting of NSTEMI: ant apical AK and inf-apical AK, EF 29%, dOM2 70-80% (small); findings c/w Tako-Tsubo CM  . Cardiomyopathy, nonischemic ([place]) 04/2008   Likely Tako-Tsubo. Resolved. --dx'd on cath 2005. EF 29% with minimal distal CAD (small OM1 70-80).  Echo 10/09 EF 55%;  echo 01/26/12: EF 55%, mild LAE, PASP 34.  Marland Kitchen CHB (complete heart block) (HCC)    Intermittent, s/p Saint Jude PPM  . CHF (congestive heart failure) ([place])   . Colitis, ischemic ([place])   . Complication of anesthesia    post anesthesia excessive  somnulence  . Compression fracture of C-spine ([place]) 10/10/2011   Fell at home, tx at [facility]  . Diarrhea associated with pseudomembranous colitis 6/11-17/2013   [facility]  . Dyslipidemia   . Fall    with non-healing rib fractures  . Glaucoma   . Granuloma annulare   . History of phlebitis   . Hypertension    SEVERE  . Idiopathic thrombocytopenic purpura (ITP) (HCC)   . Lower extremity edema   . Lumbar stenosis 2004   DR. MARK ROY  . Multiple pelvic fractures ([place]) 05/2012   Dr Adaline Sill , [facility]  . Osteoporosis   . Renal disorder    Stage 3 kidney disease    Medications:  Scheduled:  . brimonidine  1 drop Right Eye BID  . carvedilol  6.25 mg Oral BID WC  . furosemide  80 mg Intravenous BID  . hydroxypropyl methylcellulose / hypromellose  1 drop Both Eyes BID  . multivitamin  1 tablet Oral Daily  .  pantoprazole  40 mg Oral Daily  . sodium chloride flush  3 mL Intravenous Q12H  . timolol  1 drop Right Eye BID  . vitamin B-12  1,000 mcg Oral BID  . Warfarin - Pharmacist Dosing Inpatient   Does not apply q1800   Infusions:  . sodium chloride      Assessment: [name] on warfarin PTA for AFib admitted with volume overload. INR 4.5 on admit, no active bleeding, H/H stable. Note recent dose adjustment for elevated INRs outpatient.  INR down 5.1 > 1.9 (?) this AM, redrawn to confirm quick drop, was 3.1 on repeat check.  *Home dose = 3mg  MWF, 1.5mg  TTSS  No overt bleeding or complications noted.  Goal of Therapy:  INR 2-3 Monitor platelets by anticoagulation protocol: Yes   Plan:  -Anticipate further INR drop tomorrow - will give Coumadin 1 mg x 1 tonight. -Watch closely for S/Sx bleeding - CBC tomorrow   [place] Crane, Roylene Reason, [facility] Clinical Pharmacist Phone [phone]  09/08/2019 3:00 PM

## 2019-09-08 NOTE — Progress Notes (Signed)
PROGRESS NOTE    [name]  [id number] DOB: 12-Nov-1922 DOA: 09/06/2019 PCP: Colon Branch, MD   Brief Narrative:   86 year old lady with prior h/o NICM, hypertension, permanent atrial fibrillation with tachybradycardia syndrome s/p pacemaker placement, chronic diastolic heart failure, NSTEMI presents to ED for worsening shortness of breath.  He was admitted to [facility] for acute on chronic diastolic heart failure.  Cardiology consulted for management of CHF.  Assessment & Plan:   Principal Problem:   Acute on chronic diastolic (congestive) heart failure (HCC) Active Problems:   *Essential hypertension   CKD (chronic kidney disease) stage 4, GFR 15-29 ml/min (HCC)   AKI (acute kidney injury) (HCC)   Complete heart block (HCC)   Pacemaker   Chronic atrial fibrillation (HCC)   Chronic anticoagulation   Supratherapeutic INR   Pressure injury of skin  Acute on chronic diastolic heart failure Admitted to telemetry and started the patient on IV Lasix 80 mg twice daily and metolazone 2.5 mg 1 dose given yesterday.   Last echocardiogram in November 2020 showed left ventricular ejection fraction of 55 to 60%. Chest x-ray does not show any pulmonary edema at this time.  Appreciate cardiology input Continue with daily weights, strict intake and output and fluid restriction.   . Patient has diuresed about 850 mL urine output over the last 24 hours. Her creatinine slightly worsened from 2.3-2.6.     History of coronary artery disease Patient denies any chest pain or shortness of breath at this time.    Chronic atrial fibrillation, tachybradycardia syndrome s/p pacemaker in 2018 Rate controlled with Coreg INR is 3.1, globin 8 as per pharmacy No obvious signs of bleeding.    Essential hypertension Blood pressure parameters are soft this afternoon.  Continue to monitor.  Stage IV CKD Slight worsening of creatinine from 2.3-2.6 today, probably from diuresis. Continue to  monitor    Mild normocytic anemia Hemoglobin baseline between 9-10 and stable continue to monitor.   Pressure injury present on admission. Pressure Injury 09/06/19 Buttocks Right;Left Stage 2 -  Partial thickness loss of dermis presenting as a shallow open injury with a red, pink wound bed without slough. (Active)  09/06/19 1815  Location: Buttocks  Location Orientation: Right;Left  Staging: Stage 2 -  Partial thickness loss of dermis presenting as a shallow open injury with a red, pink wound bed without slough.  Wound Description (Comments):   Present on Admission: Yes    Chronic thrombocytopenia Platelets are 37,000.  No obvious signs of bleeding.  Continue to monitor     DVT prophylaxis: SCDs/Coumadin Code Status: DNR Family Communication: None at bedside  disposition Plan:  . Patient from home, possibly discharge home with home health.   Consultants:   Cardiology   Procedures: none.   Antimicrobials: None.   Subjective: Breathing has improved, no chest pain or sob.  Would like to work with PT.   Objective: Vitals:   09/08/19 0122 09/08/19 0329 09/08/19 0912 09/08/19 1208  BP:  92/76 110/60 129/63  Pulse:  82  83  Resp:  19  20  Temp:  97.7 F (36.5 C)  (!) 97.1 F (36.2 C)  TempSrc:  Oral  Oral  SpO2:  96%  98%  Weight: 67.8 kg     Height:        Intake/Output Summary (Last 24 hours) at 09/08/2019 1341 Last data filed at 09/08/2019 0123 Gross per 24 hour  Intake 480 ml  Output 350 ml  Net 130 ml   Filed Weights   09/07/19 0531 09/07/19 0747 09/08/19 0122  Weight: 67 kg 65.4 kg 67.8 kg    Examination:  General exam: Frail elderly lady, not on oxygen and not in any kind of distress at this time Respiratory system: Decreased air entry at bases, no wheezing or rhonchi Cardiovascular system: S1-S2 heard, irregularly irregular, no JVD, no murmurs Gastrointestinal system: Abdomen is soft, nontender, nondistended, bowel sounds normal Central  nervous system: Alert and oriented to place and person and able to answer all questions and following commands. Extremities: Trace leg edema present Skin: stage 2 buttock pressure ulcer Psychiatry: Mood is appropriate    Data Reviewed: I have personally reviewed following labs and imaging studies  CBC: Recent Labs  Lab 09/06/19 1413 09/07/19 1822 09/08/19 0450  WBC 9.0 6.0 8.1  NEUTROABS 7.0  --   --   HGB 10.6* 9.2* 9.1*  HCT 32.8* 28.2* 27.8*  MCV 97.9 97.2 95.2  PLT 37* PLATELET CLUMPS NOTED ON SMEAR, UNABLE TO ESTIMATE 37*   Basic Metabolic Panel: Recent Labs  Lab 09/05/19 1612 09/06/19 1413 09/07/19 0457 09/08/19 0450  NA 136 138 138 135  K 4.8 4.4 3.9 3.9  CL 98 97* 97* 95*  CO2 23 26 29 30   GLUCOSE 98 103* 79 106*  BUN 73* 67* 63* 65*  CREATININE 2.76* 2.78* 2.38* 2.65*  CALCIUM 9.5 9.7 9.2 8.9  MG  --   --  2.0  --    GFR: Estimated Creatinine Clearance: 12.3 mL/min (A) (by C-G formula based on SCr of 2.65 mg/dL (H)). Liver Function Tests: Recent Labs  Lab 09/06/19 1831  AST 18  ALT 12  ALKPHOS 99  BILITOT 1.1  PROT 6.4*  ALBUMIN 2.8*   No results for input(s): LIPASE, AMYLASE in the last 168 hours. No results for input(s): AMMONIA in the last 168 hours. Coagulation Profile: Recent Labs  Lab 09/05/19 1612 09/06/19 1413 09/07/19 0922 09/08/19 0450 09/08/19 1309  INR 4.2* 4.5* 5.1* 1.9* 3.1*   Cardiac Enzymes: No results for input(s): CKTOTAL, CKMB, CKMBINDEX, TROPONINI in the last 168 hours. BNP (last 3 results) Recent Labs    03/01/19 1517  PROBNP 479.0*   HbA1C: No results for input(s): HGBA1C in the last 72 hours. CBG: No results for input(s): GLUCAP in the last 168 hours. Lipid Profile: No results for input(s): CHOL, HDL, LDLCALC, TRIG, CHOLHDL, LDLDIRECT in the last 72 hours. Thyroid Function Tests: Recent Labs    09/07/19 0457  TSH 2.949   Anemia Panel: No results for input(s): VITAMINB12, FOLATE, FERRITIN, TIBC, IRON,  RETICCTPCT in the last 72 hours. Sepsis Labs: No results for input(s): PROCALCITON, LATICACIDVEN in the last 168 hours.  Recent Results (from the past 240 hour(s))  SARS CORONAVIRUS 2 (TAT 6-24 HRS) Nasopharyngeal Nasopharyngeal Swab     Status: None   Collection Time: 09/06/19  4:14 PM   Specimen: Nasopharyngeal Swab  Result Value Ref Range Status   SARS Coronavirus 2 NEGATIVE NEGATIVE Final    Comment: (NOTE) SARS-CoV-2 target nucleic acids are NOT DETECTED. The SARS-CoV-2 RNA is generally detectable in upper and lower respiratory specimens during the acute phase of infection. Negative results do not preclude SARS-CoV-2 infection, do not rule out co-infections with other pathogens, and should not be used as the sole basis for treatment or other patient management decisions. Negative results must be combined with clinical observations, patient history, and epidemiological information. The expected result is Negative. Fact Sheet for Patients:  SugarRoll.be Fact Sheet for Healthcare Providers: https://www.woods-mathews.com/ This test is not yet approved or cleared by the [place] FDA and  has been authorized for detection and/or diagnosis of SARS-CoV-2 by FDA under an Emergency Use Authorization (EUA). This EUA will remain  in effect (meaning this test can be used) for the duration of the COVID-19 declaration under Section 56 4(b)(1) of the Act, 21 U.S.C. section 360bbb-3(b)(1), unless the authorization is terminated or revoked sooner. Performed at [facility], [place] [address] [place], [place] [address]          Radiology Studies: US RENAL  Result Date: 09/06/2019 CLINICAL DATA:  Acute kidney injury. EXAM: RENAL / URINARY TRACT ULTRASOUND COMPLETE COMPARISON:  06/23/2018 FINDINGS: Right Kidney: Renal measurements: 9.3 x 3.5 x 5.3 cm = volume: 90 mL. Increased parenchymal echogenicity with cortical thinning. No mass or  hydronephrosis visualized. Left Kidney: Renal measurements: 8.8 x 4.2 x 4.1 cm = volume: 79 mL. Increased parenchymal echogenicity with cortical thinning. No mass or hydronephrosis visualized. Bladder: Appears normal for degree of bladder distention. Other: Enlarged spleen measuring 13.8 cm in length with a volume of 570 cc. IMPRESSION: 1. Medical renal disease.  No hydronephrosis. 2. Splenomegaly. Electronically Signed   By: Logan Bores M.D.   On: 09/06/2019 17:49   DG Chest Port 1 View  Result Date: 09/06/2019 CLINICAL DATA:  Lower extremity edema. History of congestive heart failure EXAM: PORTABLE CHEST 1 VIEW COMPARISON:  June 16, 2018. FINDINGS: There is mild scarring in the right base. There is no edema or airspace opacity. Heart is upper normal in size with pulmonary vascularity normal. Pacemaker leads are attached to the right atrium and right ventricle. There is aortic atherosclerosis. No adenopathy. There old healed rib fractures on the right. There is a focal hiatal hernia. There is arthropathy in each shoulder. IMPRESSION: Scarring right base. No edema or airspace opacity. Stable cardiac silhouette. Pacemaker leads attached to right atrium and right ventricle. Aortic Atherosclerosis (ICD10-I70.0). Hiatal hernia present. Evidence of arthropathy in the shoulders and old rib trauma on the right. Electronically Signed   By: Lowella Grip III M.D.   On: 09/06/2019 14:56        Scheduled Meds: . brimonidine  1 drop Right Eye BID  . carvedilol  6.25 mg Oral BID WC  . furosemide  80 mg Intravenous BID  . hydroxypropyl methylcellulose / hypromellose  1 drop Both Eyes BID  . metolazone  2.5 mg Oral Once  . multivitamin  1 tablet Oral Daily  . pantoprazole  40 mg Oral Daily  . sodium chloride flush  3 mL Intravenous Q12H  . timolol  1 drop Right Eye BID  . vitamin B-12  1,000 mcg Oral BID  . Warfarin - Pharmacist Dosing Inpatient   Does not apply q1800   Continuous Infusions: .  sodium chloride       LOS: 2 days     Hosie Poisson, MD [facility]   To contact the attending provider between 7A-7P or the covering provider during after hours 7P-7A, please log into the web site [email or web address] and access using universal [place] password for that web site. If you do not have the password, please call the hospital operator.  09/08/2019, 1:41 PM

## 2019-09-08 NOTE — Evaluation (Signed)
Physical Therapy Evaluation Patient Details Name: [name] MRN: [id number] DOB: [birth year] Today's Date: 09/08/2019   History of Present Illness  86 y.o. female admitted on 09/06/19 for acute on chronic diastolic heart failure.  Pt with significant PMH of lumbar stenosis, CHF, CAD, A-flutter, lumbar stenosis, HTN, c-spine fxs, back surgery, pacemaker.    Clinical Impression  Pt is weak and wobbly on her feet, but is likely not far from her baseline.  She is considering SNF for rehab, but is fearful of getting COVID there.  She is going to talk to her son and a friend who offered to come help her when she got home from the hospital to see if they can help provide care.  If she does go home she would benefit from [facility] program which would maximize her home services.   PT to follow acutely for deficits listed below.     Follow Up Recommendations SNF    Equipment Recommendations  None recommended by PT    Recommendations for Other Services OT consult     Precautions / Restrictions Precautions Precautions: Fall Precaution Comments: h/o falls      Mobility  Bed Mobility Overal bed mobility: Needs Assistance Bed Mobility: Supine to Sit     Supine to sit: Min assist;HOB elevated     General bed mobility comments: Min assist to support trunk with HOB elevated and strong use of rails.  Pt reports she sleeps in her lift chair, so bed mobility is difficult.   Transfers Overall transfer level: Needs assistance Equipment used: Rolling walker (2 wheeled) Transfers: Sit to/from Stand Sit to Stand: Min guard         General transfer comment: Min guard assist to stand from elevated bed (simulating lift chair) for balance and stabilit of RW.   Ambulation/Gait Ambulation/Gait assistance: Min guard Gait Distance (Feet): 25 Feet Assistive device: Rolling walker (2 wheeled) Gait Pattern/deviations: Step-through pattern;Shuffle;Trunk flexed     General Gait Details:  Pt with slow, shuffling gait pattern, likely her baseline, min guard assist for safety when up on her feet. Strategically placed resting chairs, but not needed able to make it to the door and back to the far side of the bed.          Balance Overall balance assessment: Needs assistance Sitting-balance support: Feet supported;Bilateral upper extremity supported;No upper extremity supported Sitting balance-Leahy Scale: Good     Standing balance support: Bilateral upper extremity supported Standing balance-Leahy Scale: Fair Standing balance comment: Pt able to assist in pulling up her underwear, which is difficult given her R UE weakness.                              Pertinent Vitals/Pain Pain Assessment: No/denies pain    Home Living Family/patient expects to be discharged to:: Private residence Living Arrangements: Alone Available Help at Discharge: Family;Available PRN/intermittently;Friend(s) Type of Home: House Home Access: Stairs to enter   [employer] of Steps: 1 Home Layout: One level Home Equipment: Walker - 2 wheels Additional Comments: pt reports that she has glasses for close up vision, but her vision is poor.      Prior Function Level of Independence: Independent with assistive device(s);Needs assistance   Gait / Transfers Assistance Needed: uses RW for household gait, son drives her to appointments, gets her groceries.            Hand Dominance   Dominant Hand: Right  Extremity/Trunk Assessment   Upper Extremity Assessment Upper Extremity Assessment: (reports R hand "nerve injury from fall last June)    Lower Extremity Assessment Lower Extremity Assessment: Generalized weakness((+) edema, tender to touch)    Cervical / Trunk Assessment Cervical / Trunk Assessment: Other exceptions Cervical / Trunk Exceptions: kyphotic h/o lumbar surgery, h/o cervical spine fxs.   Communication   Communication: No difficulties  Cognition  Arousal/Alertness: Awake/alert Behavior During Therapy: WFL for tasks assessed/performed Overall Cognitive Status: Within Functional Limits for tasks assessed                                               Assessment/Plan    PT Assessment Patient needs continued PT services  PT Problem List Decreased strength;Decreased activity tolerance;Decreased balance;Decreased mobility;Decreased knowledge of use of DME       PT Treatment Interventions DME instruction;Gait training;Stair training;Functional mobility training;Therapeutic activities;Therapeutic exercise;Balance training;Cognitive remediation;Patient/family education    PT Goals (Current goals can be found in the Care Plan section)  Acute Rehab PT Goals Patient Stated Goal: to be able to dress herself better PT Goal Formulation: With patient Time For Goal Achievement: 09/22/19 Potential to Achieve Goals: Good    Frequency Min 3X/week   Barriers to discharge Decreased caregiver support         AM-PAC PT "6 Clicks" Mobility  Outcome Measure Help needed turning from your back to your side while in a flat bed without using bedrails?: A Little Help needed moving from lying on your back to sitting on the side of a flat bed without using bedrails?: A Little Help needed moving to and from a bed to a chair (including a wheelchair)?: A Little Help needed standing up from a chair using your arms (e.g., wheelchair or bedside chair)?: A Little Help needed to walk in hospital room?: A Little Help needed climbing 3-5 steps with a railing? : A Little 6 Click Score: 18    End of Session   Activity Tolerance: Patient tolerated treatment well Patient left: in bed;with call bell/phone within reach   PT Visit Diagnosis: Muscle weakness (generalized) (M62.81);Difficulty in walking, not elsewhere classified (R26.2)    Time: [id number] PT Time Calculation (min) (ACUTE ONLY): 60 min             Verdene Lennert, PT,  DPT  Acute Rehabilitation [phone] pager #[phone] office  @ [email or web address]: [phone]   PT Evaluation $PT Eval Moderate Complexity: 1 Mod PT Treatments $Gait Training: 8-22 mins $Therapeutic Activity: 23-37 mins        09/08/2019, 5:16 PM

## 2019-09-08 NOTE — Progress Notes (Addendum)
Advanced Heart Failure Rounding Note  PCP-Cardiologist: Glori Bickers, MD   Subjective:    Admitted with marked volume overload and started on IV lasix. Weight up 5 pounds?   Yesterday diuresed with 80 mg IV lasix x1.    Creatinine trending down 2.8>2.4>2.6 .    Complaining of leg pain. Denies SOB. Frustrated about urinary incontinence.    Objective:   Weight Range: 67.8 kg Body mass index is 25.64 kg/m.   Vital Signs:   Temp:  [97.4 F (36.3 C)-97.7 F (36.5 C)] 97.7 F (36.5 C) (02/26 0329) Pulse Rate:  [72-82] 82 (02/26 0329) Resp:  [18-20] 19 (02/26 0329) BP: (92-112)/(56-76) 110/60 (02/26 0912) SpO2:  [96 %-100 %] 96 % (02/26 0329) Weight:  [67.8 kg] 67.8 kg (02/26 0122) Last BM Date: 09/07/19  Weight change: Filed Weights   09/07/19 0531 09/07/19 0747 09/08/19 0122  Weight: 67 kg 65.4 kg 67.8 kg    Intake/Output:   Intake/Output Summary (Last 24 hours) at 09/08/2019 1134 Last data filed at 09/08/2019 0123 Gross per 24 hour  Intake 700 ml  Output 350 ml  Net 350 ml      Physical Exam    General:  No resp difficulty. Sitting in the chair.  HEENT: normal Neck: supple. JVP to jaw. Carotids 2+ bilat; no bruits. No lymphadenopathy or thryomegaly appreciated. Cor: PMI nondisplaced. Irregular rate & rhythm. No rubs, gallops or murmurs. Lungs: clear Abdomen: soft, nontender, nondistended. No hepatosplenomegaly. No bruits or masses. Good bowel sounds. Extremities: no cyanosis, clubbing, rash, R and LLE 1+  Neuro: alert & orientedx3, cranial nerves grossly intact. moves all 4 extremities w/o difficulty. Affect pleasant   Telemetry    A Fib 70-80s   EKG   n/a  Labs    CBC Recent Labs    09/06/19 1413 09/06/19 1413 09/07/19 1822 09/08/19 0450  WBC 9.0   < > 6.0 8.1  NEUTROABS 7.0  --   --   --   HGB 10.6*   < > 9.2* 9.1*  HCT 32.8*   < > 28.2* 27.8*  MCV 97.9   < > 97.2 95.2  PLT 37*   < > PLATELET CLUMPS NOTED ON SMEAR, UNABLE TO  ESTIMATE 37*   < > = values in this interval not displayed.   Basic Metabolic Panel Recent Labs    09/07/19 0457 09/08/19 0450  NA 138 135  K 3.9 3.9  CL 97* 95*  CO2 29 30  GLUCOSE 79 106*  BUN 63* 65*  CREATININE 2.38* 2.65*  CALCIUM 9.2 8.9  MG 2.0  --    Liver Function Tests Recent Labs    09/06/19 1831  AST 18  ALT 12  ALKPHOS 99  BILITOT 1.1  PROT 6.4*  ALBUMIN 2.8*   No results for input(s): LIPASE, AMYLASE in the last 72 hours. Cardiac Enzymes No results for input(s): CKTOTAL, CKMB, CKMBINDEX, TROPONINI in the last 72 hours.  BNP: BNP (last 3 results) Recent Labs    08/09/19 1420 09/05/19 1612 09/06/19 1413  BNP 259.6* 374.0* 258.7*    ProBNP (last 3 results) Recent Labs    03/01/19 1517  PROBNP 479.0*     D-Dimer No results for input(s): DDIMER in the last 72 hours. Hemoglobin A1C No results for input(s): HGBA1C in the last 72 hours. Fasting Lipid Panel No results for input(s): CHOL, HDL, LDLCALC, TRIG, CHOLHDL, LDLDIRECT in the last 72 hours. Thyroid Function Tests Recent Labs    09/07/19 0457  TSH 2.949    Other results:   Imaging    No results found.   Medications:     Scheduled Medications: . brimonidine  1 drop Right Eye BID  . carvedilol  6.25 mg Oral BID WC  . furosemide  80 mg Intravenous Daily  . hydroxypropyl methylcellulose / hypromellose  1 drop Both Eyes BID  . multivitamin  1 tablet Oral Daily  . pantoprazole  40 mg Oral Daily  . sodium chloride flush  3 mL Intravenous Q12H  . timolol  1 drop Right Eye BID  . vitamin B-12  1,000 mcg Oral BID  . Warfarin - Pharmacist Dosing Inpatient   Does not apply q1800    Infusions: . sodium chloride      PRN Medications: sodium chloride, acetaminophen, docusate sodium, ondansetron (ZOFRAN) IV, sodium chloride flush     Assessment/Plan   1.Acute onChronic diastolic [id number] of Takotsubo cardiomyopathy, EF has recovered. - Echo 05/2019 EF 55-60%, RV  mildly reduced  - R>>L HF symptoms, mainly systemic congestion. CXR w/o overt edema  - Remains volume overloaded.  - Increase lasix to 80 mg twice a day.  - Needs daily standing weights.  - Intolerant unna boots..    2. CAD: -Minimal branch vessel disease on cath - No chest pain  - intolerant of statins - no ASA due to coumadin  3.Permanent AF with Tachybrady syndrome s/p St Jude PPM 11/06/16 -Rate controlled. - Continue coreg 6.25 mg bid -- INR down 1.9. Restart coumadin if repeat INR ok. Discussed with pharmacy.    4.HTN  - Stable.  - Continue Coreg 6.25 bid -   5. AKI on CKD, Stage IV-V: - baseline SCr ~2.0-2.5 - 2. 8 on admit. Todays creatinine 2.65.    6. Supratherapeutic INR:  - on coumadin for Afib - INR 1.9 today  (hgb stable at 10.6- baseline), likely 2/2 hepatic congestion from CHF -  Monitor for bleeding  Repeat INR ? Rapid drop.    Length of Stay: 2  Darrick Grinder, NP  09/08/2019, 11:34 AM  Advanced Heart Failure Team Pager [phone] (M-F; 7a - 4p)  Please contact [facility] for night-coverage after hours (4p -7a ) and weekends on [email or web address]  Patient seen and examined with the above-signed Advanced Practice Provider and/or Housestaff. I personally reviewed laboratory data, imaging studies and relevant notes. I independently examined the patient and formulated the important aspects of the plan. I have edited the note to reflect any of my changes or salient points. I have personally discussed the plan with the patient and/or family.  JVP 10 Cor IRR Lungs CTA Ab soft NT/ND Ext 2+ edema  Has R> L HF. Only modest diuresis on IV lasix. Weight up. Will increase lasix and give one dose metolazone. Having hard time tolerating TED hose and UNNA boots. Renal function relatively stable.   INR 3.1. Likely can restart low-dose coumadin soon. (INR up due to hepatic congestion)  Discussed dosing with PharmD personally. Platelets remain low. No bleeding.    Glori Bickers, MD  4:37 PM

## 2019-09-09 LAB — BASIC METABOLIC PANEL
Anion gap: 12 (ref 5–15)
BUN: 67 mg/dL — ABNORMAL HIGH (ref 8–23)
CO2: 29 mmol/L (ref 22–32)
Calcium: 9 mg/dL (ref 8.9–10.3)
Chloride: 93 mmol/L — ABNORMAL LOW (ref 98–111)
Creatinine, Ser: 2.51 mg/dL — ABNORMAL HIGH (ref 0.44–1.00)
GFR calc Af Amer: 18 mL/min — ABNORMAL LOW (ref >60)
GFR calc non Af Amer: 16 mL/min — ABNORMAL LOW (ref >60)
Glucose, Bld: 89 mg/dL (ref 70–99)
Potassium: 3.9 mmol/L (ref 3.5–5.1)
Sodium: 134 mmol/L — ABNORMAL LOW (ref 135–145)

## 2019-09-09 LAB — PROTIME-INR
INR: 2.6 — ABNORMAL HIGH (ref 0.8–1.2)
Prothrombin Time: 27.6 seconds — ABNORMAL HIGH (ref 11.4–15.2)

## 2019-09-09 MED ORDER — WARFARIN SODIUM 1 MG PO TABS
1.5000 mg | ORAL_TABLET | Freq: Once | ORAL | Status: AC
  Administered 2019-09-09: 1.5 mg via ORAL
  Filled 2019-09-09: qty 1

## 2019-09-09 NOTE — Progress Notes (Addendum)
Advanced Heart Failure Rounding Note  PCP-Cardiologist: Glori Bickers, MD   Subjective:    Breathing is better at rest   Still weak  Gives out with activity (minimal acitivty)  No CP    Objective:   Weight Range: 66.8 kg Body mass index is 25.28 kg/m.   Vital Signs:   Temp:  [97.1 F (36.2 C)-97.4 F (36.3 C)] 97.4 F (36.3 C) (02/27 0439) Pulse Rate:  [77-83] 77 (02/27 0439) Resp:  [18-20] 18 (02/27 0439) BP: (101-129)/(59-63) 101/59 (02/27 0439) SpO2:  [92 %-99 %] 99 % (02/27 0439) Weight:  [66.8 kg] 66.8 kg (02/27 0500) Last BM Date: 09/08/19  Weight change: Filed Weights   09/07/19 0747 09/08/19 0122 09/09/19 0500  Weight: 65.4 kg 67.8 kg 66.8 kg    Intake/Output:   Intake/Output Summary (Last 24 hours) at 09/09/2019 0915 Last data filed at 09/09/2019 0504 Gross per 24 hour  Intake 480 ml  Output 800 ml  Net -320 ml    Net net 2. L   Physical Exam    General:  No resp difficulty. Sitting in the chair.  HEENT: normal Neck: supple. JVP increased  .  Cor: PMI nondisplaced. Irregular rate & rhythm. No rubs, gallops or murmurs. Lungs: clear bilaterally  Abdomen: soft, nontender, nondistended. No hepatosplenomegaly. No bruits or masses. Good bowel sounds. Extremities: no cyanosis, clubbing, rash,  1+ LE edema  Neuro: alert & orientedx3, cranial nerves grossly intact. moves all 4 extremities w/o difficulty. Affect pleasant   Telemetry   Afib   70s  Personally reviewed   EKG   n/a  Labs    CBC Recent Labs    09/06/19 1413 09/06/19 1413 09/07/19 1822 09/08/19 0450  WBC 9.0   < > 6.0 8.1  NEUTROABS 7.0  --   --   --   HGB 10.6*   < > 9.2* 9.1*  HCT 32.8*   < > 28.2* 27.8*  MCV 97.9   < > 97.2 95.2  PLT 37*   < > PLATELET CLUMPS NOTED ON SMEAR, UNABLE TO ESTIMATE 37*   < > = values in this interval not displayed.   Basic Metabolic Panel Recent Labs    09/07/19 0457 09/07/19 0457 09/08/19 0450 09/09/19 0639  NA 138   < > 135 134*    K 3.9   < > 3.9 3.9  CL 97*   < > 95* 93*  CO2 29   < > 30 29  GLUCOSE 79   < > 106* 89  BUN 63*   < > 65* 67*  CREATININE 2.38*   < > 2.65* 2.51*  CALCIUM 9.2   < > 8.9 9.0  MG 2.0  --   --   --    < > = values in this interval not displayed.   Liver Function Tests Recent Labs    09/06/19 1831  AST 18  ALT 12  ALKPHOS 99  BILITOT 1.1  PROT 6.4*  ALBUMIN 2.8*   No results for input(s): LIPASE, AMYLASE in the last 72 hours. Cardiac Enzymes No results for input(s): CKTOTAL, CKMB, CKMBINDEX, TROPONINI in the last 72 hours.  BNP: BNP (last 3 results) Recent Labs    08/09/19 1420 09/05/19 1612 09/06/19 1413  BNP 259.6* 374.0* 258.7*    ProBNP (last 3 results) Recent Labs    03/01/19 1517  PROBNP 479.0*     D-Dimer No results for input(s): DDIMER in the last 72 hours. Hemoglobin A1C No  results for input(s): HGBA1C in the last 72 hours. Fasting Lipid Panel No results for input(s): CHOL, HDL, LDLCALC, TRIG, CHOLHDL, LDLDIRECT in the last 72 hours. Thyroid Function Tests Recent Labs    09/07/19 0457  TSH 2.949    Other results:   Imaging    No results found.   Medications:     Scheduled Medications: . brimonidine  1 drop Right Eye BID  . carvedilol  6.25 mg Oral BID WC  . furosemide  80 mg Intravenous BID  . hydroxypropyl methylcellulose / hypromellose  1 drop Both Eyes BID  . multivitamin  1 tablet Oral Daily  . pantoprazole  40 mg Oral Daily  . sodium chloride flush  3 mL Intravenous Q12H  . timolol  1 drop Right Eye BID  . vitamin B-12  1,000 mcg Oral BID  . warfarin  1.5 mg Oral ONCE-1800  . Warfarin - Pharmacist Dosing Inpatient   Does not apply q1800    Infusions: . sodium chloride      PRN Medications: sodium chloride, acetaminophen, docusate sodium, ondansetron (ZOFRAN) IV, sodium chloride flush     Assessment/Plan   1.Acute onChronic diastolic [id number] of Takotsubo cardiomyopathy, EF has recovered. - Echo 05/2019  EF 55-60%, RV mildly reduced  -Remains volume overloaded but improving  Would continue IV diuresis    2. CAD: -Minimal branch vessel disease on cath - No chest pain  - intolerant of statins - no ASA due to coumadin  3.Permanent AF with Tachybrady syndrome s/p St Jude PPM 11/06/16 -Rate controlled. - Continue coreg 6.25 mg bid -- INR down 1.9. Restart coumadin if repeat INR ok. Discussed with pharmacy.    4.HTN  - BP is controlled   - Continue Coreg 6.25 bid -   5. AKI on CKD, Stage IV-V: - baseline SCr ~2.0-2.5  Today it is 2.51      6. Hx supratherapeutic INR    INR today 2.6  7  Plt   Plt count 37K yesterday    Hgb is stable   8  Dispo:  Pt lives alone  Son lives 5 min away   She does not think in current state she can do this   Will review with PT and SW    Length of Stay: 3  Dorris Carnes, MD  09/09/2019, 9:15 AM

## 2019-09-09 NOTE — Progress Notes (Addendum)
PROGRESS NOTE    [name]  [id number] DOB: 1923/04/05 DOA: 09/06/2019 PCP: Colon Branch, MD   Brief Narrative:   86 year old lady with prior h/o NICM, hypertension, permanent atrial fibrillation with tachybradycardia syndrome s/p pacemaker placement, chronic diastolic heart failure, NSTEMI presents to ED for worsening shortness of breath.  He was admitted to [facility] for acute on chronic diastolic heart failure.  Cardiology consulted for management of CHF. Patient seen and examined at bedside reports bilateral leg pain and does not feel like she can go home with home PT and is agreeable to SNF.  Assessment & Plan:   Principal Problem:   Acute on chronic diastolic (congestive) heart failure (HCC) Active Problems:   *Essential hypertension   CKD (chronic kidney disease) stage 4, GFR 15-29 ml/min (HCC)   AKI (acute kidney injury) (HCC)   Complete heart block (HCC)   Pacemaker   Chronic atrial fibrillation (HCC)   Chronic anticoagulation   Supratherapeutic INR   Pressure injury of skin  Acute on chronic diastolic heart failure Admitted to telemetry and started the patient on IV Lasix 80 mg twice daily    Last echocardiogram in November 2020 showed left ventricular ejection fraction of 55 to 60%. Chest x-ray does not show any pulmonary edema at this time.  Appreciate cardiology input Continue with daily weights, strict intake and output and fluid restriction.   Creatinine stable at 2.5, diuresed about 1.1 L overnight.  Patient continues to be fluid overload.   History of coronary artery disease Denies any chest pain at this time    Chronic atrial fibrillation, tachybradycardia syndrome s/p pacemaker in 2018 Rate controlled with Coreg Therapeutic INR of 2.6 No obvious signs of bleeding.    Essential hypertension Well-controlled blood pressure parameters.  Stage IV CKD Creatinine stable at 2.  5.  Continue to monitor    Mild normocytic anemia Hemoglobin baseline  between 9-10 and stable continue to monitor.  Repeat CBC in the morning   Pressure injury present on admission. Pressure Injury 09/06/19 Buttocks Right;Left Stage 2 -  Partial thickness loss of dermis presenting as a shallow open injury with a red, pink wound bed without slough. (Active)  09/06/19 1815  Location: Buttocks  Location Orientation: Right;Left  Staging: Stage 2 -  Partial thickness loss of dermis presenting as a shallow open injury with a red, pink wound bed without slough.  Wound Description (Comments):   Present on Admission: Yes    Chronic thrombocytopenia Platelets are 37,000.  No obvious signs of bleeding.  Continue to monitor     DVT prophylaxis: SCDs/Coumadin Code Status: DNR Family Communication: None at bedside  disposition Plan:  . Patient from home, possibly DC to SNF   Consultants:   Cardiology   Procedures: none.   Antimicrobials: None.   Subjective: Patient reports her legs are hurting and sore.  Breathing has improved, no chest pain  Objective: Vitals:   09/08/19 1936 09/09/19 0439 09/09/19 0500 09/09/19 1202  BP: 110/62 (!) 101/59  107/69  Pulse: 78 77  66  Resp: 19 18  20   Temp: (!) 97.4 F (36.3 C) (!) 97.4 F (36.3 C)  (!) 97.4 F (36.3 C)  TempSrc: Oral Oral  Oral  SpO2: 92% 99%  97%  Weight:   66.8 kg   Height:        Intake/Output Summary (Last 24 hours) at 09/09/2019 1422 Last data filed at 09/09/2019 0900 Gross per 24 hour  Intake 480 ml  Output 600 ml  Net -120 ml   Filed Weights   09/07/19 0747 09/08/19 0122 09/09/19 0500  Weight: 65.4 kg 67.8 kg 66.8 kg    Examination:  General exam: Frail elderly lady on room air, not in any kind of distress at this time. Respiratory system: Diminished air entry at bases, no wheezing or rhonchi, tachypnea on ambulation Cardiovascular system: S1-S2 heard, irregularly irregular, no JVD, improving pedal edema Gastrointestinal system: Abdomen is soft, nontender, nondistended bowel  sounds normal Central nervous system: Alert and oriented, following commands, grossly nonfocal Extremities: Improving leg edema Skin: Stage II buttock pressure ulcer Psychiatry: Mood is appropriate    Data Reviewed: I have personally reviewed following labs and imaging studies  CBC: Recent Labs  Lab 09/06/19 1413 09/07/19 1822 09/08/19 0450  WBC 9.0 6.0 8.1  NEUTROABS 7.0  --   --   HGB 10.6* 9.2* 9.1*  HCT 32.8* 28.2* 27.8*  MCV 97.9 97.2 95.2  PLT 37* PLATELET CLUMPS NOTED ON SMEAR, UNABLE TO ESTIMATE 37*   Basic Metabolic Panel: Recent Labs  Lab 09/05/19 1612 09/06/19 1413 09/07/19 0457 09/08/19 0450 09/09/19 0639  NA 136 138 138 135 134*  K 4.8 4.4 3.9 3.9 3.9  CL 98 97* 97* 95* 93*  CO2 23 26 29 30 29   GLUCOSE 98 103* 79 106* 89  BUN 73* 67* 63* 65* 67*  CREATININE 2.76* 2.78* 2.38* 2.65* 2.51*  CALCIUM 9.5 9.7 9.2 8.9 9.0  MG  --   --  2.0  --   --    GFR: Estimated Creatinine Clearance: 12.9 mL/min (A) (by C-G formula based on SCr of 2.51 mg/dL (H)). Liver Function Tests: Recent Labs  Lab 09/06/19 1831  AST 18  ALT 12  ALKPHOS 99  BILITOT 1.1  PROT 6.4*  ALBUMIN 2.8*   No results for input(s): LIPASE, AMYLASE in the last 168 hours. No results for input(s): AMMONIA in the last 168 hours. Coagulation Profile: Recent Labs  Lab 09/06/19 1413 09/07/19 0922 09/08/19 0450 09/08/19 1309 09/09/19 0639  INR 4.5* 5.1* 1.9* 3.1* 2.6*   Cardiac Enzymes: No results for input(s): CKTOTAL, CKMB, CKMBINDEX, TROPONINI in the last 168 hours. BNP (last 3 results) Recent Labs    03/01/19 1517  PROBNP 479.0*   HbA1C: No results for input(s): HGBA1C in the last 72 hours. CBG: No results for input(s): GLUCAP in the last 168 hours. Lipid Profile: No results for input(s): CHOL, HDL, LDLCALC, TRIG, CHOLHDL, LDLDIRECT in the last 72 hours. Thyroid Function Tests: Recent Labs    09/07/19 0457  TSH 2.949   Anemia Panel: No results for input(s):  VITAMINB12, FOLATE, FERRITIN, TIBC, IRON, RETICCTPCT in the last 72 hours. Sepsis Labs: No results for input(s): PROCALCITON, LATICACIDVEN in the last 168 hours.  Recent Results (from the past 240 hour(s))  SARS CORONAVIRUS 2 (TAT 6-24 HRS) Nasopharyngeal Nasopharyngeal Swab     Status: None   Collection Time: 09/06/19  4:14 PM   Specimen: Nasopharyngeal Swab  Result Value Ref Range Status   SARS Coronavirus 2 NEGATIVE NEGATIVE Final    Comment: (NOTE) SARS-CoV-2 target nucleic acids are NOT DETECTED. The SARS-CoV-2 RNA is generally detectable in upper and lower respiratory specimens during the acute phase of infection. Negative results do not preclude SARS-CoV-2 infection, do not rule out co-infections with other pathogens, and should not be used as the sole basis for treatment or other patient management decisions. Negative results must be combined with clinical observations, patient history, and epidemiological  information. The expected result is Negative. Fact Sheet for Patients: SugarRoll.be Fact Sheet for Healthcare Providers: https://www.woods-mathews.com/ This test is not yet approved or cleared by the [place] FDA and  has been authorized for detection and/or diagnosis of SARS-CoV-2 by FDA under an Emergency Use Authorization (EUA). This EUA will remain  in effect (meaning this test can be used) for the duration of the COVID-19 declaration under Section 56 4(b)(1) of the Act, 21 U.S.C. section 360bbb-3(b)(1), unless the authorization is terminated or revoked sooner. Performed at [facility], [place] [address] [place], [place] [address]          Radiology Studies: No results found.      Scheduled Meds: . brimonidine  1 drop Right Eye BID  . carvedilol  6.25 mg Oral BID WC  . furosemide  80 mg Intravenous BID  . hydroxypropyl methylcellulose / hypromellose  1 drop Both Eyes BID  . multivitamin  1 tablet Oral  Daily  . pantoprazole  40 mg Oral Daily  . sodium chloride flush  3 mL Intravenous Q12H  . timolol  1 drop Right Eye BID  . vitamin B-12  1,000 mcg Oral BID  . warfarin  1.5 mg Oral ONCE-1800  . Warfarin - Pharmacist Dosing Inpatient   Does not apply q1800   Continuous Infusions: . sodium chloride       LOS: 3 days     Hosie Poisson, MD [facility]   To contact the attending provider between 7A-7P or the covering provider during after hours 7P-7A, please log into the web site [email or web address] and access using universal [place] password for that web site. If you do not have the password, please call the hospital operator.  09/09/2019, 2:22 PM

## 2019-09-09 NOTE — Progress Notes (Signed)
[place] for warfarin Indication: atrial fibrillation  Allergies  Allergen Reactions  . Morphine And Related Anaphylaxis and Nausea Only    Per family, made her violently ill  . Oxycodone Other (See Comments)    Dizziness   . Penicillins Swelling and Other (See Comments)    Joint edema Has patient had a PCN reaction causing immediate rash, facial/tongue/throat swelling, SOB or lightheadedness with hypotension: Yes Has patient had a PCN reaction causing severe rash involving mucus membranes or skin necrosis: No Has patient had a PCN reaction that required hospitalization: No Has patient had a PCN reaction occurring within the last 10 years: No If all of the above answers are "NO", then may proceed with Cephalosporin use.   . Clindamycin/Lincomycin Hives and Itching  . Colchicine Other (See Comments)    Hair loss  . Norvasc [Amlodipine Besylate] Other (See Comments)    Reaction not recalled by the patient ??  . Ofloxacin Other (See Comments)    Unknown   . Prednisone Other (See Comments)    Blurred vision  . Streptomycin Other (See Comments)    "was a long time ago" reaction not recalled  . Tape Other (See Comments)    SKIN IS VERY THIN AND WILL TEAR AND BRUISE EASILY!!  . Tramadol Itching  . Alendronate Sodium Palpitations  . Amiodarone Rash and Other (See Comments)    Possible rash and nervousness per patient  . Plaquenil [Hydroxychloroquine Sulfate] Rash    Patient Measurements: Height: 5\' 4"  (162.6 cm) Weight: 147 lb 4.8 oz (66.8 kg) IBW/kg (Calculated) : 54.7 Heparin Dosing Weight:   Vital Signs: Temp: 97.4 F (36.3 C) (02/27 0439) Temp Source: Oral (02/27 0439) BP: 101/59 (02/27 0439) Pulse Rate: 77 (02/27 0439)  Labs: Recent Labs    09/06/19 1413 09/06/19 1413 09/07/19 0457 09/07/19 0922 09/07/19 1822 09/08/19 0450 09/08/19 1309 09/09/19 0639  HGB 10.6*   < >  --   --  9.2* 9.1*  --   --   HCT 32.8*  --   --    --  28.2* 27.8*  --   --   PLT 37*  --   --   --  PLATELET CLUMPS NOTED ON SMEAR, UNABLE TO ESTIMATE 37*  --   --   LABPROT 42.6*  --   --    < >  --  21.6* 32.3* 27.6*  INR 4.5*  --   --    < >  --  1.9* 3.1* 2.6*  CREATININE 2.78*   < > 2.38*  --   --  2.65*  --  2.51*   < > = values in this interval not displayed.    Estimated Creatinine Clearance: 12.9 mL/min (A) (by C-G formula based on SCr of 2.51 mg/dL (H)).   Medical History: Past Medical History:  Diagnosis Date  . Atrial fibrillation ([place])   . AV block, 1st degree   . CAD (coronary artery disease)    Non ST elevation 2005 ; LHC in 6/05 in setting of NSTEMI: ant apical AK and inf-apical AK, EF 29%, dOM2 70-80% (small); findings c/w Tako-Tsubo CM  . Cardiomyopathy, nonischemic ([place]) 04/2008   Likely Tako-Tsubo. Resolved. --dx'd on cath 2005. EF 29% with minimal distal CAD (small OM1 70-80).  Echo 10/09 EF 55%;  echo 01/26/12: EF 55%, mild LAE, PASP 34.  Marland Kitchen CHB (complete heart block) (HCC)    Intermittent, s/p Saint Jude PPM  . CHF (congestive heart failure) (  [place])   . Colitis, ischemic ([place])   . Complication of anesthesia    post anesthesia excessive somnulence  . Compression fracture of C-spine ([place]) 10/10/2011   Fell at home, tx at [facility]  . Diarrhea associated with pseudomembranous colitis 6/11-17/2013   [facility]  . Dyslipidemia   . Fall    with non-healing rib fractures  . Glaucoma   . Granuloma annulare   . History of phlebitis   . Hypertension    SEVERE  . Idiopathic thrombocytopenic purpura (ITP) (HCC)   . Lower extremity edema   . Lumbar stenosis 2004   DR. MARK ROY  . Multiple pelvic fractures ([place]) 05/2012   Dr Adaline Sill , [facility]  . Osteoporosis   . Renal disorder    Stage 3 kidney disease    Medications:  Scheduled:  . brimonidine  1 drop Right Eye BID  . carvedilol  6.25 mg Oral BID WC  . furosemide  80 mg Intravenous BID  . hydroxypropyl methylcellulose / hypromellose  1 drop Both Eyes BID   . multivitamin  1 tablet Oral Daily  . pantoprazole  40 mg Oral Daily  . sodium chloride flush  3 mL Intravenous Q12H  . timolol  1 drop Right Eye BID  . vitamin B-12  1,000 mcg Oral BID  . Warfarin - Pharmacist Dosing Inpatient   Does not apply q1800   Infusions:  . sodium chloride      Assessment: [name] on warfarin PTA for AFib admitted with volume overload. INR 4.5 on admit, no active bleeding, H/H stable. Note recent dose adjustment for elevated INRs outpatient.  INR down from 3.1 to 2.6, which is within goal range.  *Home dose = 3mg  MWF, 1.5mg  TTSS  No overt bleeding or complications noted. CBC is stable but platelets are low at 37. Pt appears to have chronic thrombocytopenia.  Goal of Therapy:  INR 2-3 Monitor platelets by anticoagulation protocol: Yes   Plan:  -Anticipate INR to start stabilizing after restart of dosing yesterday. will give Coumadin 1.5 mg x 1 tonight. -Monitor daily INR and CBC -Watch closely for S/Sx bleeding   Sherren Kerns, PharmD PGY1 Acute Care Pharmacy Resident  09/09/2019 7:52 AM

## 2019-09-10 LAB — CBC
HCT: 26.8 % — ABNORMAL LOW (ref 36.0–46.0)
Hemoglobin: 8.7 g/dL — ABNORMAL LOW (ref 12.0–15.0)
MCH: 31.2 pg (ref 26.0–34.0)
MCHC: 32.5 g/dL (ref 30.0–36.0)
MCV: 96.1 fL (ref 80.0–100.0)
Platelets: 41 10*3/uL — ABNORMAL LOW (ref 150–400)
RBC: 2.79 MIL/uL — ABNORMAL LOW (ref 3.87–5.11)
RDW: 14.8 % (ref 11.5–15.5)
WBC: 9.5 10*3/uL (ref 4.0–10.5)
nRBC: 0 % (ref 0.0–0.2)

## 2019-09-10 LAB — BASIC METABOLIC PANEL
Anion gap: 10 (ref 5–15)
BUN: 67 mg/dL — ABNORMAL HIGH (ref 8–23)
CO2: 33 mmol/L — ABNORMAL HIGH (ref 22–32)
Calcium: 9.2 mg/dL (ref 8.9–10.3)
Chloride: 89 mmol/L — ABNORMAL LOW (ref 98–111)
Creatinine, Ser: 2.55 mg/dL — ABNORMAL HIGH (ref 0.44–1.00)
GFR calc Af Amer: 18 mL/min — ABNORMAL LOW (ref >60)
GFR calc non Af Amer: 16 mL/min — ABNORMAL LOW (ref >60)
Glucose, Bld: 99 mg/dL (ref 70–99)
Potassium: 3.6 mmol/L (ref 3.5–5.1)
Sodium: 132 mmol/L — ABNORMAL LOW (ref 135–145)

## 2019-09-10 LAB — PROTIME-INR
INR: 2.2 — ABNORMAL HIGH (ref 0.8–1.2)
Prothrombin Time: 24.3 seconds — ABNORMAL HIGH (ref 11.4–15.2)

## 2019-09-10 MED ORDER — METOLAZONE 2.5 MG PO TABS
2.5000 mg | ORAL_TABLET | Freq: Every day | ORAL | Status: DC
  Administered 2019-09-10 – 2019-09-11 (×2): 2.5 mg via ORAL
  Filled 2019-09-10 (×3): qty 1

## 2019-09-10 MED ORDER — WARFARIN SODIUM 1 MG PO TABS
1.5000 mg | ORAL_TABLET | Freq: Once | ORAL | Status: AC
  Administered 2019-09-10: 1.5 mg via ORAL
  Filled 2019-09-10: qty 1

## 2019-09-10 MED ORDER — POTASSIUM CHLORIDE CRYS ER 20 MEQ PO TBCR
30.0000 meq | EXTENDED_RELEASE_TABLET | Freq: Once | ORAL | Status: AC
  Administered 2019-09-10: 30 meq via ORAL
  Filled 2019-09-10: qty 1

## 2019-09-10 NOTE — Evaluation (Signed)
Occupational Therapy Evaluation Patient Details Name: [name] MRN: [id number] DOB: 04/20/1923 Today's Date: 09/10/2019    History of Present Illness 86 y.o. female admitted on 09/06/19 for acute on chronic diastolic heart failure.  Pt with significant PMH of lumbar stenosis, CHF, CAD, A-flutter, lumbar stenosis, HTN, c-spine fxs, back surgery, pacemaker.     Clinical Impression   Pt PTA: pt living alone and performing own ADL and mobility. Pt's son assisting with IADLs. Pt currently limited by decreased activity tolerance, decreased strength and decreased ability to perform self care tasks safely. Pt modA overall for ADL and minA overall for mobility. Pt requiring rest break at sink in sitting; pt unable to properly perform pericare without assist and unable to perform LB dressing without assist. Pt would greatly benefit from continued OT skilled services for ADL, mobility and safety in SNF setting. OT following acutely.     Follow Up Recommendations  SNF(if pt not agreeable to SNF then home first program with [facility])    Equipment Recommendations  3 in 1 bedside commode    Recommendations for Other Services       Precautions / Restrictions Precautions Precautions: Fall Precaution Comments: h/o falls Restrictions Weight Bearing Restrictions: No      Mobility Bed Mobility Overal bed mobility: Needs Assistance Bed Mobility: Supine to Sit     Supine to sit: Min assist;HOB elevated     General bed mobility comments: MinA for trunk elevation, scooting to EOB and assist with BLEs to EOB  Transfers Overall transfer level: Needs assistance Equipment used: Rolling walker (2 wheeled) Transfers: Sit to/from Stand Sit to Stand: Min assist         General transfer comment: momentum iwth minA for power up     Balance Overall balance assessment: Needs assistance Sitting-balance support: Feet supported;Bilateral upper extremity supported;No upper extremity  supported Sitting balance-Leahy Scale: Good     Standing balance support: Bilateral upper extremity supported Standing balance-Leahy Scale: Fair                             ADL either performed or assessed with clinical judgement   ADL Overall ADL's : Needs assistance/impaired Eating/Feeding: Modified independent;Sitting   Grooming: Min guard;Standing Grooming Details (indicate cue type and reason): pt able to stand ~1 minute before requiring to sit down Upper Body Bathing: Set up;Sitting   Lower Body Bathing: Moderate assistance;Sitting/lateral leans;Sit to/from stand;Cueing for safety   Upper Body Dressing : Set up;Sitting   Lower Body Dressing: Moderate assistance;Cueing for safety;Sitting/lateral leans;Sit to/from stand   Toilet Transfer: Minimal assistance;Ambulation;RW Toilet Transfer Details (indicate cue type and reason): minA for power up Toileting- Clothing Manipulation and Hygiene: Moderate assistance;Cueing for safety;Sitting/lateral lean;Sit to/from stand       Functional mobility during ADLs: Minimal assistance;Rolling walker;Cueing for safety(safety cues required around obstacles) General ADL Comments: Pt limited by decreased activity tolerance, decreased strength and decreased ability to perform self care tasks safely.     Vision Baseline Vision/History: Wears glasses Wears Glasses: At all times Patient Visual Report: No change from baseline Vision Assessment?: Vision impaired- to be further tested in functional context Additional Comments: Pt reports poor vision at baseline. Pt able to read clock and open ADL containers.     Perception     Praxis      Pertinent Vitals/Pain Pain Assessment: No/denies pain     Hand Dominance Right   Extremity/Trunk Assessment Upper Extremity Assessment Upper Extremity  Assessment: Generalized weakness;RUE deficits/detail;LUE deficits/detail RUE Deficits / Details: loss of sensation in areas of hand due to  previous nerve injury LUE Deficits / Details: loss of sensation in areas of hand due to previous nerve injury   Lower Extremity Assessment Lower Extremity Assessment: Generalized weakness   Cervical / Trunk Assessment Cervical / Trunk Assessment: Other exceptions Cervical / Trunk Exceptions: kyphotic h/o lumbar surgery, h/o cervical spine fxs.    Communication Communication Communication: No difficulties   Cognition Arousal/Alertness: Awake/alert Behavior During Therapy: WFL for tasks assessed/performed Overall Cognitive Status: Within Functional Limits for tasks assessed                                     General Comments  VSS on RA. HR in 90s throughout session    Exercises     Shoulder Instructions      Home Living Family/patient expects to be discharged to:: Private residence Living Arrangements: Alone Available Help at Discharge: Family;Available PRN/intermittently;Friend(s) Type of Home: House Home Access: Stairs to enter [employer] of Steps: 1   Home Layout: One level         Bathroom Toilet: Standard     Home Equipment: Walker - 2 wheels          Prior Functioning/Environment Level of Independence: Independent with assistive device(s);Needs assistance  Gait / Transfers Assistance Needed: uses RW for household gait, son drives her to appointments, gets her groceries.  ADL's / Homemaking Assistance Needed: performs all ADL/IADL by self. Reports light cooking at home; son buys groceries and provides transport            OT Problem List: Decreased strength;Decreased activity tolerance;Impaired balance (sitting and/or standing);Decreased safety awareness;Pain;Impaired UE functional use;Decreased knowledge of precautions;Decreased coordination      OT Treatment/Interventions: Self-care/ADL training;Therapeutic exercise;Energy conservation;DME and/or AE instruction;Therapeutic activities;Patient/family education;Balance training     OT Goals(Current goals can be found in the care plan section) Acute Rehab OT Goals Patient Stated Goal: to be able to take of myself OT Goal Formulation: With patient Time For Goal Achievement: 10/01/2019 Potential to Achieve Goals: Good ADL Goals Pt Will Perform Grooming: with supervision;standing Pt Will Perform Lower Body Dressing: with min guard assist;sit to/from stand Pt Will Transfer to Toilet: with supervision;ambulating Pt Will Perform Toileting - Clothing Manipulation and hygiene: with min guard assist;sitting/lateral leans;sit to/from stand Pt/caregiver will Perform Home Exercise Program: Increased strength;Both right and left upper extremity;With minimal assist Additional ADL Goal #1: pt will increase standing tolerance x5 mins in prep for ADL tasks  OT Frequency: Min 2X/week   Barriers to D/C:            Co-evaluation              AM-PAC OT "6 Clicks" Daily Activity     Outcome Measure Help from another person eating meals?: None Help from another person taking care of personal grooming?: A Little Help from another person toileting, which includes using toliet, bedpan, or urinal?: A Lot Help from another person bathing (including washing, rinsing, drying)?: A Lot Help from another person to put on and taking off regular upper body clothing?: A Little Help from another person to put on and taking off regular lower body clothing?: A Lot 6 Click Score: 16   End of Session Equipment Utilized During Treatment: Gait belt;Rolling walker Nurse Communication: Mobility status  Activity Tolerance: Patient tolerated treatment well Patient left:  in chair;with call bell/phone within reach;with chair alarm set  OT Visit Diagnosis: Unsteadiness on feet (R26.81);Muscle weakness (generalized) (M62.81)                Time: [id number] OT Time Calculation (min): 26 min Charges:  OT General Charges $OT Visit: 1 Visit OT Evaluation $OT Eval Moderate Complexity: 1 Mod OT  Treatments $Self Care/Home Management : 8-22 mins  Jefferey Pica, OTR/L Acute Rehabilitation Services Pager: [phone] Office: [phone]   Aerianna Losey C 09/10/2019, 3:30 PM

## 2019-09-10 NOTE — Progress Notes (Signed)
Advanced Heart Failure Rounding Note  PCP-Cardiologist: Glori Bickers, MD   Subjective:    Still SOB when moves around   "[facility] wont my weight go down"  No CP   Objective:   Weight Range: 65.5 kg Body mass index is 24.77 kg/m.   Vital Signs:   Temp:  [97.4 F (36.3 C)-97.6 F (36.4 C)] 97.5 F (36.4 C) (02/28 0434) Pulse Rate:  [66-77] 77 (02/28 0434) Resp:  [15-20] 15 (02/28 0434) BP: (107-117)/(59-69) 110/63 (02/28 0434) SpO2:  [96 %-97 %] 96 % (02/28 0434) Weight:  [65.5 kg] 65.5 kg (02/28 0454) Last BM Date: 09/09/19  Weight change: Filed Weights   09/08/19 0122 09/09/19 0500 09/10/19 0454  Weight: 67.8 kg 66.8 kg 65.5 kg    Intake/Output:   Intake/Output Summary (Last 24 hours) at 09/10/2019 0617 Last data filed at 09/10/2019 0000 Gross per 24 hour  Intake 720 ml  Output 1901 ml  Net -1181 ml    Net net  3.2. L   Physical Exam    General:  No resp difficulty. Sitting in the chair.  HEENT: normal Neck: s. JVP increased  .  Cor: . Irregular rate & rhythm. No murmurs   Lungs: Rales at bases   Abdomen: soft, nontender, nondistended. No hepatosplenomegaly. Extremities: no cyanosis, clubbing, rash,  1+ LE edema  Neuro: alert & orientedx3, cranial nerves grossly intact. moves all 4 extremities w/o difficulty. Affect pleasant   Telemetry    Adfib 60s  Personally reviewed   EKG   n/a  Labs    CBC Recent Labs    09/08/19 0450 09/10/19 0335  WBC 8.1 9.5  HGB 9.1* 8.7*  HCT 27.8* 26.8*  MCV 95.2 96.1  PLT 37* 41*   Basic Metabolic Panel Recent Labs    09/09/19 0639 09/10/19 0335  NA 134* 132*  K 3.9 3.6  CL 93* 89*  CO2 29 33*  GLUCOSE 89 99  BUN 67* 67*  CREATININE 2.51* 2.55*  CALCIUM 9.0 9.2   Liver Function Tests No results for input(s): AST, ALT, ALKPHOS, BILITOT, PROT, ALBUMIN in the last 72 hours. No results for input(s): LIPASE, AMYLASE in the last 72 hours. Cardiac Enzymes No results for input(s): CKTOTAL, CKMB,  CKMBINDEX, TROPONINI in the last 72 hours.  BNP: BNP (last 3 results) Recent Labs    08/09/19 1420 09/05/19 1612 09/06/19 1413  BNP 259.6* 374.0* 258.7*    ProBNP (last 3 results) Recent Labs    03/01/19 1517  PROBNP 479.0*     D-Dimer No results for input(s): DDIMER in the last 72 hours. Hemoglobin A1C No results for input(s): HGBA1C in the last 72 hours. Fasting Lipid Panel No results for input(s): CHOL, HDL, LDLCALC, TRIG, CHOLHDL, LDLDIRECT in the last 72 hours. Thyroid Function Tests No results for input(s): TSH, T4TOTAL, T3FREE, THYROIDAB in the last 72 hours.  Invalid input(s): FREET3  Other results:   Imaging    No results found.   Medications:     Scheduled Medications: . brimonidine  1 drop Right Eye BID  . carvedilol  6.25 mg Oral BID WC  . furosemide  80 mg Intravenous BID  . hydroxypropyl methylcellulose / hypromellose  1 drop Both Eyes BID  . multivitamin  1 tablet Oral Daily  . pantoprazole  40 mg Oral Daily  . sodium chloride flush  3 mL Intravenous Q12H  . timolol  1 drop Right Eye BID  . vitamin B-12  1,000 mcg Oral BID  .  Warfarin - Pharmacist Dosing Inpatient   Does not apply q1800    Infusions: . sodium chloride      PRN Medications: sodium chloride, acetaminophen, docusate sodium, ondansetron (ZOFRAN) IV, sodium chloride flush     Assessment/Plan   1.Acute onChronic diastolic [id number] of Takotsubo cardiomyopathy, EF has recovered. - Echo 05/2019 EF 55-60%, RV mildly reduced  -Still with volume increse on exam   Will add Zaroxolyn to regimen and follow    2. CAD: -Minimal branch vessel disease on cath - No chest pain  - intolerant of statins - no ASA due to coumadin  3.Permanent AF with Tachybrady syndrome s/p St Jude PPM 11/06/16   Rates OK on current regimen of Coreg   Keep on warfarin  -  4.HTN  - BP is controlled    -   5. AKI on CKD, Stage IV-V: - baseline SCr ~2.0-2.5  Today it  Relatively  stable at 2.55      7  Plt   Plt count 41K     8  Dispo:  Pt lives alone  Son lives 5 min away   She does not think in current state she can do this   Will review with PT and SW    She would do better with assistance   Fatigues with activity    Length of Stay: 4  Dorris Carnes, MD  09/10/2019, 6:17 AM

## 2019-09-10 NOTE — Progress Notes (Signed)
[place] for warfarin Indication: atrial fibrillation  Allergies  Allergen Reactions  . Morphine And Related Anaphylaxis and Nausea Only    Per family, made her violently ill  . Oxycodone Other (See Comments)    Dizziness   . Penicillins Swelling and Other (See Comments)    Joint edema Has patient had a PCN reaction causing immediate rash, facial/tongue/throat swelling, SOB or lightheadedness with hypotension: Yes Has patient had a PCN reaction causing severe rash involving mucus membranes or skin necrosis: No Has patient had a PCN reaction that required hospitalization: No Has patient had a PCN reaction occurring within the last 10 years: No If all of the above answers are "NO", then may proceed with Cephalosporin use.   . Clindamycin/Lincomycin Hives and Itching  . Colchicine Other (See Comments)    Hair loss  . Norvasc [Amlodipine Besylate] Other (See Comments)    Reaction not recalled by the patient ??  . Ofloxacin Other (See Comments)    Unknown   . Prednisone Other (See Comments)    Blurred vision  . Streptomycin Other (See Comments)    "was a long time ago" reaction not recalled  . Tape Other (See Comments)    SKIN IS VERY THIN AND WILL TEAR AND BRUISE EASILY!!  . Tramadol Itching  . Alendronate Sodium Palpitations  . Amiodarone Rash and Other (See Comments)    Possible rash and nervousness per patient  . Plaquenil [Hydroxychloroquine Sulfate] Rash    Patient Measurements: Height: 5\' 4"  (162.6 cm) Weight: 144 lb 4.8 oz (65.5 kg) IBW/kg (Calculated) : 54.7 Heparin Dosing Weight:   Vital Signs: Temp: 97.5 F (36.4 C) (02/28 0434) Temp Source: Oral (02/28 0434) BP: 110/63 (02/28 0434) Pulse Rate: 77 (02/28 0434)  Labs: Recent Labs    09/07/19 0922 09/07/19 1822 09/08/19 0450 09/08/19 0450 09/08/19 1309 09/09/19 0639 09/10/19 0335  HGB   < > 9.2* 9.1*  --   --   --  8.7*  HCT  --  28.2* 27.8*  --   --   --  26.8*   PLT  --  PLATELET CLUMPS NOTED ON SMEAR, UNABLE TO ESTIMATE 37*  --   --   --  41*  LABPROT   < >  --  21.6*   < > 32.3* 27.6* 24.3*  INR   < >  --  1.9*   < > 3.1* 2.6* 2.2*  CREATININE  --   --  2.65*  --   --  2.51* 2.55*   < > = values in this interval not displayed.    Estimated Creatinine Clearance: 11.6 mL/min (A) (by C-G formula based on SCr of 2.55 mg/dL (H)).   Medical History: Past Medical History:  Diagnosis Date  . Atrial fibrillation ([place])   . AV block, 1st degree   . CAD (coronary artery disease)    Non ST elevation 2005 ; LHC in 6/05 in setting of NSTEMI: ant apical AK and inf-apical AK, EF 29%, dOM2 70-80% (small); findings c/w Tako-Tsubo CM  . Cardiomyopathy, nonischemic ([place]) 04/2008   Likely Tako-Tsubo. Resolved. --dx'd on cath 2005. EF 29% with minimal distal CAD (small OM1 70-80).  Echo 10/09 EF 55%;  echo 01/26/12: EF 55%, mild LAE, PASP 34.  Marland Kitchen CHB (complete heart block) (HCC)    Intermittent, s/p Saint Jude PPM  . CHF (congestive heart failure) ([place])   . Colitis, ischemic ([place])   . Complication of anesthesia    post  anesthesia excessive somnulence  . Compression fracture of C-spine ([place]) 10/10/2011   Fell at home, tx at [facility]  . Diarrhea associated with pseudomembranous colitis 6/11-17/2013   [facility]  . Dyslipidemia   . Fall    with non-healing rib fractures  . Glaucoma   . Granuloma annulare   . History of phlebitis   . Hypertension    SEVERE  . Idiopathic thrombocytopenic purpura (ITP) (HCC)   . Lower extremity edema   . Lumbar stenosis 2004   DR. MARK ROY  . Multiple pelvic fractures ([place]) 05/2012   Dr Adaline Sill , [facility]  . Osteoporosis   . Renal disorder    Stage 3 kidney disease    Medications:  Scheduled:  . brimonidine  1 drop Right Eye BID  . carvedilol  6.25 mg Oral BID WC  . furosemide  80 mg Intravenous BID  . hydroxypropyl methylcellulose / hypromellose  1 drop Both Eyes BID  . multivitamin  1 tablet Oral Daily  .  pantoprazole  40 mg Oral Daily  . sodium chloride flush  3 mL Intravenous Q12H  . timolol  1 drop Right Eye BID  . vitamin B-12  1,000 mcg Oral BID  . Warfarin - Pharmacist Dosing Inpatient   Does not apply q1800   Infusions:  . sodium chloride      Assessment: [name] on warfarin PTA for AFib admitted with volume overload. INR 4.5 on admit, no active bleeding, H/H stable. Note recent dose adjustment for elevated INRs outpatient.  INR down from 2.6 to 2.2, which is within goal range.  *Home dose = 3mg  MWF, 1.5mg  TTSS  No overt bleeding or complications noted. CBC is stable but platelets are low at 41. Pt appears to have chronic thrombocytopenia.  Goal of Therapy:  INR 2-3 Monitor platelets by anticoagulation protocol: Yes   Plan:  -Anticipate INR to start stabilizing after restart of dosing for the last 2 days. will give Coumadin 1.5 mg x 1 tonight.  -Monitor daily INR and CBC -Watch closely for S/Sx bleeding   Sherren Kerns, PharmD PGY1 Acute Care Pharmacy Resident  09/10/2019 7:04 AM

## 2019-09-10 NOTE — Progress Notes (Addendum)
PROGRESS NOTE    [name]  [id number] DOB: 1923/06/15 DOA: 09/06/2019 PCP: Colon Branch, MD   Brief Narrative:   86 year old lady with prior h/o NICM, hypertension, permanent atrial fibrillation with tachybradycardia syndrome s/p pacemaker placement, chronic diastolic heart failure, NSTEMI presents to ED for worsening shortness of breath.  He was admitted to [facility] for acute on chronic diastolic heart failure.  Cardiology consulted for management of CHF. Patient seen and examined at bedside reports bilateral leg pain and does not feel like she can go home with home PT and is agreeable to SNF. PT reports pain in the legs.   Assessment & Plan:   Principal Problem:   Acute on chronic diastolic (congestive) heart failure (HCC) Active Problems:   *Essential hypertension   CKD (chronic kidney disease) stage 4, GFR 15-29 ml/min (HCC)   AKI (acute kidney injury) (HCC)   Complete heart block (HCC)   Pacemaker   Chronic atrial fibrillation (HCC)   Chronic anticoagulation   Supratherapeutic INR   Pressure injury of skin  Acute on chronic diastolic heart failure Admitted to telemetry and started the patient on IV Lasix 80 mg twice daily    Last echocardiogram in November 2020 showed left ventricular ejection fraction of 55 to 60%. Chest x-ray does not show any pulmonary edema at this time.  Appreciate cardiology input Continue with daily weights, strict intake and output and fluid restriction.   Creatinine stable at 2.55, diuresed about 1.1 L overnight.  Improving slowly.    Bilateral lower extremity pain: Pain control and PT evaluation.    History of coronary artery disease No chest pain or sob.    Chronic atrial fibrillation, tachybradycardia syndrome s/p pacemaker in 2018 Rate controlled with Coreg Therapeutic INR is 2.2  No obvious signs of bleeding.    Essential hypertension Well controlled.   Stage IV CKD Creatinine stable at 2.55  Continue to  monitor    Mild normocytic anemia Hemoglobin baseline between 9-10 and stable continue to monitor.  Hemoglobin at  8.7 today.  No obvious signs of bleeding.    Pressure injury present on admission. Pressure Injury 09/06/19 Buttocks Right;Left Stage 2 -  Partial thickness loss of dermis presenting as a shallow open injury with a red, pink wound bed without slough. (Active)  09/06/19 1815  Location: Buttocks  Location Orientation: Right;Left  Staging: Stage 2 -  Partial thickness loss of dermis presenting as a shallow open injury with a red, pink wound bed without slough.  Wound Description (Comments):   Present on Admission: Yes    Chronic thrombocytopenia Platelets are41,,000.  No obvious signs of bleeding.  Continue to monitor     DVT prophylaxis: SCDs/Coumadin Code Status: DNR Family Communication: None at bedside  disposition Plan:  . Patient from home, possibly DC to SNF when appropriately diuresed. She is currently on IV lasix.    Consultants:   Cardiology   Procedures: none.   Antimicrobials: None.   Subjective: Legs are sore but better than yesterday.   Objective: Vitals:   09/10/19 0434 09/10/19 0454 09/10/19 1137 09/10/19 1628  BP: 110/63  (!) 101/59 112/66  Pulse: 77  65 76  Resp: 15  20 20   Temp: (!) 97.5 F (36.4 C)  97.6 F (36.4 C) (!) 97.4 F (36.3 C)  TempSrc: Oral  Oral Oral  SpO2: 96%  97% 98%  Weight:  65.5 kg    Height:        Intake/Output Summary (Last  24 hours) at 09/10/2019 1800 Last data filed at 09/10/2019 1731 Gross per 24 hour  Intake 240 ml  Output 1850 ml  Net -1610 ml   Filed Weights   09/08/19 0122 09/09/19 0500 09/10/19 0454  Weight: 67.8 kg 66.8 kg 65.5 kg    Examination:  General exam: no distress noted alert and comfortable.  Respiratory system: air entry fair , no wheezing.  Cardiovascular system: s1s2, irregularly irregular.  Gastrointestinal system: Abdomen is soft NT ND BS+ Central nervous system:alert  and oriented.  Extremities: improving leg edema.  Skin: Stage II buttock pressure ulcer Psychiatry: mood is appropriate.     Data Reviewed: I have personally reviewed following labs and imaging studies  CBC: Recent Labs  Lab 09/06/19 1413 09/07/19 1822 09/08/19 0450 09/10/19 0335  WBC 9.0 6.0 8.1 9.5  NEUTROABS 7.0  --   --   --   HGB 10.6* 9.2* 9.1* 8.7*  HCT 32.8* 28.2* 27.8* 26.8*  MCV 97.9 97.2 95.2 96.1  PLT 37* PLATELET CLUMPS NOTED ON SMEAR, UNABLE TO ESTIMATE 37* 41*   Basic Metabolic Panel: Recent Labs  Lab 09/06/19 1413 09/07/19 0457 09/08/19 0450 09/09/19 0639 09/10/19 0335  NA 138 138 135 134* 132*  K 4.4 3.9 3.9 3.9 3.6  CL 97* 97* 95* 93* 89*  CO2 26 29 30 29  33*  GLUCOSE 103* 79 106* 89 99  BUN 67* 63* 65* 67* 67*  CREATININE 2.78* 2.38* 2.65* 2.51* 2.55*  CALCIUM 9.7 9.2 8.9 9.0 9.2  MG  --  2.0  --   --   --    GFR: Estimated Creatinine Clearance: 11.6 mL/min (A) (by C-G formula based on SCr of 2.55 mg/dL (H)). Liver Function Tests: Recent Labs  Lab 09/06/19 1831  AST 18  ALT 12  ALKPHOS 99  BILITOT 1.1  PROT 6.4*  ALBUMIN 2.8*   No results for input(s): LIPASE, AMYLASE in the last 168 hours. No results for input(s): AMMONIA in the last 168 hours. Coagulation Profile: Recent Labs  Lab 09/07/19 0922 09/08/19 0450 09/08/19 1309 09/09/19 0639 09/10/19 0335  INR 5.1* 1.9* 3.1* 2.6* 2.2*   Cardiac Enzymes: No results for input(s): CKTOTAL, CKMB, CKMBINDEX, TROPONINI in the last 168 hours. BNP (last 3 results) Recent Labs    03/01/19 1517  PROBNP 479.0*   HbA1C: No results for input(s): HGBA1C in the last 72 hours. CBG: No results for input(s): GLUCAP in the last 168 hours. Lipid Profile: No results for input(s): CHOL, HDL, LDLCALC, TRIG, CHOLHDL, LDLDIRECT in the last 72 hours. Thyroid Function Tests: No results for input(s): TSH, T4TOTAL, FREET4, T3FREE, THYROIDAB in the last 72 hours. Anemia Panel: No results for  input(s): VITAMINB12, FOLATE, FERRITIN, TIBC, IRON, RETICCTPCT in the last 72 hours. Sepsis Labs: No results for input(s): PROCALCITON, LATICACIDVEN in the last 168 hours.  Recent Results (from the past 240 hour(s))  SARS CORONAVIRUS 2 (TAT 6-24 HRS) Nasopharyngeal Nasopharyngeal Swab     Status: None   Collection Time: 09/06/19  4:14 PM   Specimen: Nasopharyngeal Swab  Result Value Ref Range Status   SARS Coronavirus 2 NEGATIVE NEGATIVE Final    Comment: (NOTE) SARS-CoV-2 target nucleic acids are NOT DETECTED. The SARS-CoV-2 RNA is generally detectable in upper and lower respiratory specimens during the acute phase of infection. Negative results do not preclude SARS-CoV-2 infection, do not rule out co-infections with other pathogens, and should not be used as the sole basis for treatment or other patient management decisions. Negative results  must be combined with clinical observations, patient history, and epidemiological information. The expected result is Negative. Fact Sheet for Patients: SugarRoll.be Fact Sheet for Healthcare Providers: https://www.woods-mathews.com/ This test is not yet approved or cleared by the [place] FDA and  has been authorized for detection and/or diagnosis of SARS-CoV-2 by FDA under an Emergency Use Authorization (EUA). This EUA will remain  in effect (meaning this test can be used) for the duration of the COVID-19 declaration under Section 56 4(b)(1) of the Act, 21 U.S.C. section 360bbb-3(b)(1), unless the authorization is terminated or revoked sooner. Performed at [facility], [place] [address], [place], [place] [address]          Radiology Studies: No results found.      Scheduled Meds: . brimonidine  1 drop Right Eye BID  . carvedilol  6.25 mg Oral BID WC  . furosemide  80 mg Intravenous BID  . hydroxypropyl methylcellulose / hypromellose  1 drop Both Eyes BID  . metolazone  2.5 mg  Oral Daily  . multivitamin  1 tablet Oral Daily  . pantoprazole  40 mg Oral Daily  . sodium chloride flush  3 mL Intravenous Q12H  . timolol  1 drop Right Eye BID  . vitamin B-12  1,000 mcg Oral BID  . Warfarin - Pharmacist Dosing Inpatient   Does not apply q1800   Continuous Infusions: . sodium chloride       LOS: 4 days     Hosie Poisson, MD [facility]   To contact the attending provider between 7A-7P or the covering provider during after hours 7P-7A, please log into the web site [email or web address] and access using universal [place] password for that web site. If you do not have the password, please call the hospital operator.  09/10/2019, 6:00 PM

## 2019-09-11 ENCOUNTER — Inpatient Hospital Stay (HOSPITAL_COMMUNITY): Payer: Medicare Other

## 2019-09-11 DIAGNOSIS — Z7901 Long term (current) use of anticoagulants: Secondary | ICD-10-CM

## 2019-09-11 DIAGNOSIS — M7989 Other specified soft tissue disorders: Secondary | ICD-10-CM

## 2019-09-11 DIAGNOSIS — R791 Abnormal coagulation profile: Secondary | ICD-10-CM

## 2019-09-11 DIAGNOSIS — N184 Chronic kidney disease, stage 4 (severe): Secondary | ICD-10-CM

## 2019-09-11 DIAGNOSIS — I482 Chronic atrial fibrillation, unspecified: Secondary | ICD-10-CM

## 2019-09-11 DIAGNOSIS — I10 Essential (primary) hypertension: Secondary | ICD-10-CM

## 2019-09-11 DIAGNOSIS — R600 Localized edema: Secondary | ICD-10-CM

## 2019-09-11 DIAGNOSIS — R52 Pain, unspecified: Secondary | ICD-10-CM

## 2019-09-11 LAB — CK: Total CK: <5 U/L — ABNORMAL LOW (ref 38–234)

## 2019-09-11 LAB — PROTIME-INR
INR: 1.8 — ABNORMAL HIGH (ref 0.8–1.2)
Prothrombin Time: 21.1 seconds — ABNORMAL HIGH (ref 11.4–15.2)

## 2019-09-11 LAB — MAGNESIUM: Magnesium: 1.7 mg/dL (ref 1.7–2.4)

## 2019-09-11 LAB — BASIC METABOLIC PANEL
Anion gap: 15 (ref 5–15)
BUN: 74 mg/dL — ABNORMAL HIGH (ref 8–23)
CO2: 33 mmol/L — ABNORMAL HIGH (ref 22–32)
Calcium: 9.4 mg/dL (ref 8.9–10.3)
Chloride: 86 mmol/L — ABNORMAL LOW (ref 98–111)
Creatinine, Ser: 2.78 mg/dL — ABNORMAL HIGH (ref 0.44–1.00)
GFR calc Af Amer: 16 mL/min — ABNORMAL LOW (ref >60)
GFR calc non Af Amer: 14 mL/min — ABNORMAL LOW (ref >60)
Glucose, Bld: 107 mg/dL — ABNORMAL HIGH (ref 70–99)
Potassium: 3.7 mmol/L (ref 3.5–5.1)
Sodium: 134 mmol/L — ABNORMAL LOW (ref 135–145)

## 2019-09-11 MED ORDER — POTASSIUM CHLORIDE CRYS ER 20 MEQ PO TBCR
40.0000 meq | EXTENDED_RELEASE_TABLET | Freq: Once | ORAL | Status: AC
  Administered 2019-09-11: 40 meq via ORAL
  Filled 2019-09-11: qty 2

## 2019-09-11 MED ORDER — WARFARIN SODIUM 3 MG PO TABS
3.0000 mg | ORAL_TABLET | Freq: Once | ORAL | Status: AC
  Administered 2019-09-11: 3 mg via ORAL
  Filled 2019-09-11: qty 1

## 2019-09-11 MED ORDER — TORSEMIDE 20 MG PO TABS
80.0000 mg | ORAL_TABLET | Freq: Two times a day (BID) | ORAL | Status: DC
  Filled 2019-09-11: qty 4

## 2019-09-11 MED ORDER — IBUPROFEN 200 MG PO TABS
200.0000 mg | ORAL_TABLET | Freq: Once | ORAL | Status: AC
  Administered 2019-09-11: 200 mg via ORAL
  Filled 2019-09-11: qty 1

## 2019-09-11 MED ORDER — MAGNESIUM SULFATE 2 GM/50ML IV SOLN
2.0000 g | Freq: Once | INTRAVENOUS | Status: AC
  Administered 2019-09-11: 2 g via INTRAVENOUS
  Filled 2019-09-11: qty 50

## 2019-09-11 MED ORDER — IBUPROFEN 200 MG PO TABS: 200.0000 mg | ORAL_TABLET | Freq: Once | ORAL | Status: DC

## 2019-09-11 NOTE — Progress Notes (Signed)
[place] for warfarin Indication: atrial fibrillation  Allergies  Allergen Reactions  . Morphine And Related Anaphylaxis and Nausea Only    Per family, made her violently ill  . Oxycodone Other (See Comments)    Dizziness   . Penicillins Swelling and Other (See Comments)    Joint edema Has patient had a PCN reaction causing immediate rash, facial/tongue/throat swelling, SOB or lightheadedness with hypotension: Yes Has patient had a PCN reaction causing severe rash involving mucus membranes or skin necrosis: No Has patient had a PCN reaction that required hospitalization: No Has patient had a PCN reaction occurring within the last 10 years: No If all of the above answers are "NO", then may proceed with Cephalosporin use.   . Clindamycin/Lincomycin Hives and Itching  . Colchicine Other (See Comments)    Hair loss  . Norvasc [Amlodipine Besylate] Other (See Comments)    Reaction not recalled by the patient ??  . Ofloxacin Other (See Comments)    Unknown   . Prednisone Other (See Comments)    Blurred vision  . Streptomycin Other (See Comments)    "was a long time ago" reaction not recalled  . Tape Other (See Comments)    SKIN IS VERY THIN AND WILL TEAR AND BRUISE EASILY!!  . Tramadol Itching  . Alendronate Sodium Palpitations  . Amiodarone Rash and Other (See Comments)    Possible rash and nervousness per patient  . Plaquenil [Hydroxychloroquine Sulfate] Rash    Patient Measurements: Height: 5\' 4"  (162.6 cm) Weight: 142 lb 6.4 oz (64.6 kg)(scale c) IBW/kg (Calculated) : 54.7 Heparin Dosing Weight:   Vital Signs: Temp: 97.7 F (36.5 C) (03/01 0927) Temp Source: Oral (03/01 0927) BP: 111/60 (03/01 0927) Pulse Rate: 84 (03/01 0927)  Labs: Recent Labs    09/09/19 0639 09/10/19 0335 09/11/19 0343  HGB  --  8.7*  --   HCT  --  26.8*  --   PLT  --  41*  --   LABPROT 27.6* 24.3* 21.1*  INR 2.6* 2.2* 1.8*  CREATININE 2.51* 2.55*  2.78*    Estimated Creatinine Clearance: 10.7 mL/min (A) (by C-G formula based on SCr of 2.78 mg/dL (H)).   Medical History: Past Medical History:  Diagnosis Date  . Atrial fibrillation ([place])   . AV block, 1st degree   . CAD (coronary artery disease)    Non ST elevation 2005 ; LHC in 6/05 in setting of NSTEMI: ant apical AK and inf-apical AK, EF 29%, dOM2 70-80% (small); findings c/w Tako-Tsubo CM  . Cardiomyopathy, nonischemic ([place]) 04/2008   Likely Tako-Tsubo. Resolved. --dx'd on cath 2005. EF 29% with minimal distal CAD (small OM1 70-80).  Echo 10/09 EF 55%;  echo 01/26/12: EF 55%, mild LAE, PASP 34.  Marland Kitchen CHB (complete heart block) (HCC)    Intermittent, s/p Saint Jude PPM  . CHF (congestive heart failure) ([place])   . Colitis, ischemic ([place])   . Complication of anesthesia    post anesthesia excessive somnulence  . Compression fracture of C-spine ([place]) 10/10/2011   Fell at home, tx at [facility]  . Diarrhea associated with pseudomembranous colitis 6/11-17/2013   [facility]  . Dyslipidemia   . Fall    with non-healing rib fractures  . Glaucoma   . Granuloma annulare   . History of phlebitis   . Hypertension    SEVERE  . Idiopathic thrombocytopenic purpura (ITP) (HCC)   . Lower extremity edema   . Lumbar stenosis 2004  DR. Glenna Fellows  . Multiple pelvic fractures ([place]) 05/2012   Dr Adaline Sill , [facility]  . Osteoporosis   . Renal disorder    Stage 3 kidney disease    Medications:  Scheduled:  . brimonidine  1 drop Right Eye BID  . carvedilol  6.25 mg Oral BID WC  . hydroxypropyl methylcellulose / hypromellose  1 drop Both Eyes BID  . multivitamin  1 tablet Oral Daily  . pantoprazole  40 mg Oral Daily  . sodium chloride flush  3 mL Intravenous Q12H  . timolol  1 drop Right Eye BID  . vitamin B-12  1,000 mcg Oral BID  . warfarin  3 mg Oral ONCE-1800  . Warfarin - Pharmacist Dosing Inpatient   Does not apply q1800   Infusions:  . sodium chloride    . magnesium sulfate bolus  IVPB      Assessment: [name] on warfarin PTA for AFib admitted with volume overload. INR 4.5 on admit, no active bleeding, H/H stable. Note recent dose adjustment for elevated INRs outpatient.  INR down from 2.6 to 1.9  *Home dose = 3mg  MWF, 1.5mg  TTSS  No overt bleeding or complications noted. CBC is stable but platelets are low. Pt appears to have chronic thrombocytopenia.  Goal of Therapy:  INR 2-3 Monitor platelets by anticoagulation protocol: Yes   Plan:  -Warfarin 3 mg x 1 tonight. -Monitor daily INR and CBC -Watch closely for S/Sx bleeding   Marguerite Olea, [facility] Clinical Pharmacist Phone [phone]  09/11/2019 11:01 AM

## 2019-09-11 NOTE — Progress Notes (Signed)
Bilateral lower extremity venous duplex exam completed.  Preliminary results can be found under CV proc under chart review.  09/11/2019 2:37 PM  Matsue Strom, K., RDMS, RVT

## 2019-09-11 NOTE — Progress Notes (Signed)
PROGRESS NOTE    [name]  [id number] DOB: 1922/08/01 DOA: 09/06/2019 PCP: Colon Branch, MD   Brief Narrative:   86 year old lady with prior h/o NICM, hypertension, permanent atrial fibrillation with tachybradycardia syndrome s/p pacemaker placement, chronic diastolic heart failure, NSTEMI presents to ED for worsening shortness of breath.  He was admitted to [facility] for acute on chronic diastolic heart failure.  Cardiology consulted for management of CHF. Patient seen and examined at bedside reports bilateral leg pain and does not feel like she can go home with home PT and is agreeable to SNF. PT reports pain in the legs.  Venous duplex of the lower extremities ordered, CK levels ordered.  Assessment & Plan:   Principal Problem:   Acute on chronic diastolic (congestive) heart failure (HCC) Active Problems:   *Essential hypertension   CKD (chronic kidney disease) stage 4, GFR 15-29 ml/min (HCC)   AKI (acute kidney injury) (HCC)   Complete heart block (HCC)   Pacemaker   Chronic atrial fibrillation (HCC)   Chronic anticoagulation   Supratherapeutic INR   Pressure injury of skin  Acute on chronic diastolic heart failure Admitted to telemetry and started the patient on IV Lasix 80 mg twice daily, diuresed appropriately cardiology transitioned IV Lasix to oral torsemide 80 mg twice daily   Last echocardiogram in November 2020 showed left ventricular ejection fraction of 55 to 60%. Chest x-ray does not show any pulmonary edema at this time.   Recommend to continue with daily weights, fluid restriction and strict intake and output.  Bilateral lower extremity pain: Venous duplex of the lower extremities to evaluate for DVT, pain control and PT evaluation.   History of coronary artery disease Patient denies any chest pain or shortness of breath at this time   Chronic atrial fibrillation, tachybradycardia syndrome s/p pacemaker in 2018 Rate controlled with  Coreg Subtherapeutic INR with 1.8 No obvious signs of bleeding.    Essential hypertension Blood pressure measured parameters are soft this morning.  Continue to monitor  Stage IV CKD Slight worsening of creatinine from 2.5-2.7, possibly from diuresis.  Continue to monitor IV Lasix was discontinued.  And she was transitioned to oral torsemide 80 mg twice daily.  Repeat BMP in the morning    Mild normocytic anemia Hemoglobin baseline between 9-10 and stable continue to monitor.  Hemoglobin at  8.7 today.  No obvious signs of bleeding.  Repeat CBC in the morning   Pressure injury present on admission. Pressure Injury 09/06/19 Buttocks Right;Left Stage 2 -  Partial thickness loss of dermis presenting as a shallow open injury with a red, pink wound bed without slough. (Active)  09/06/19 1815  Location: Buttocks  Location Orientation: Right;Left  Staging: Stage 2 -  Partial thickness loss of dermis presenting as a shallow open injury with a red, pink wound bed without slough.  Wound Description (Comments):   Present on Admission: Yes    Chronic thrombocytopenia Platelets are 41,000.  No signs of bleeding continue to monitor     DVT prophylaxis: SCDs/Coumadin Code Status: DNR Family Communication: None at bedside  disposition Plan:  Patient from home, possibly DC to SNF when bed is available.  Patient appears to be stable to be discharged to SNF patient not stable for discharge home at this time. Consultants:   Cardiology   Procedures: none.   Antimicrobials: None.   Subjective: Requesting a stronger pain medication than Tylenol, no chest pain or shortness of breath.,  No nausea  vomiting abdominal pain, headache or dizziness.  Objective: Vitals:   09/11/19 0450 09/11/19 0927 09/11/19 1310 09/11/19 1319  BP: (!) 99/58 111/60 (!) 88/56 (!) 84/48  Pulse: 70 84 80   Resp: 18 18 11    Temp: 97.8 F (36.6 C) 97.7 F (36.5 C) (!) 97.2 F (36.2 C)   TempSrc: Oral Oral  Oral   SpO2: 94% 95% 99%   Weight: 64.6 kg     Height:        Intake/Output Summary (Last 24 hours) at 09/11/2019 1722 Last data filed at 09/11/2019 1500 Gross per 24 hour  Intake 777.86 ml  Output 1150 ml  Net -372.14 ml   Filed Weights   09/09/19 0500 09/10/19 0454 09/11/19 0450  Weight: 66.8 kg 65.5 kg 64.6 kg    Examination:  General exam: Alert and comfortable, no distress noted Respiratory system: Bilateral air entry fair, no wheezing or rhonchi, no tachypnea,  Cardiovascular system: S1-S2 heard, regular rate rhythm, no JVD, leg tenderness present Gastrointestinal system: Abdomen is soft, nontender, nondistended, bowel sounds normal Central nervous system: Alert, oriented x4 and able to answer all questions appropriately and follow commands. Extremities: Leg tenderness present Skin: Stage II buttock pressure ulcer Psychiatry: Mood is appropriate    Data Reviewed: I have personally reviewed following labs and imaging studies  CBC: Recent Labs  Lab 09/06/19 1413 09/07/19 1822 09/08/19 0450 09/10/19 0335  WBC 9.0 6.0 8.1 9.5  NEUTROABS 7.0  --   --   --   HGB 10.6* 9.2* 9.1* 8.7*  HCT 32.8* 28.2* 27.8* 26.8*  MCV 97.9 97.2 95.2 96.1  PLT 37* PLATELET CLUMPS NOTED ON SMEAR, UNABLE TO ESTIMATE 37* 41*   Basic Metabolic Panel: Recent Labs  Lab 09/07/19 0457 09/08/19 0450 09/09/19 0639 09/10/19 0335 09/11/19 0343  NA 138 135 134* 132* 134*  K 3.9 3.9 3.9 3.6 3.7  CL 97* 95* 93* 89* 86*  CO2 29 30 29  33* 33*  GLUCOSE 79 106* 89 99 107*  BUN 63* 65* 67* 67* 74*  CREATININE 2.38* 2.65* 2.51* 2.55* 2.78*  CALCIUM 9.2 8.9 9.0 9.2 9.4  MG 2.0  --   --   --  1.7   GFR: Estimated Creatinine Clearance: 10.7 mL/min (A) (by C-G formula based on SCr of 2.78 mg/dL (H)). Liver Function Tests: Recent Labs  Lab 09/06/19 1831  AST 18  ALT 12  ALKPHOS 99  BILITOT 1.1  PROT 6.4*  ALBUMIN 2.8*   No results for input(s): LIPASE, AMYLASE in the last 168 hours. No  results for input(s): AMMONIA in the last 168 hours. Coagulation Profile: Recent Labs  Lab 09/08/19 0450 09/08/19 1309 09/09/19 0639 09/10/19 0335 09/11/19 0343  INR 1.9* 3.1* 2.6* 2.2* 1.8*   Cardiac Enzymes: Recent Labs  Lab 09/11/19 0343  CKTOTAL <5*   BNP (last 3 results) Recent Labs    03/01/19 1517  PROBNP 479.0*   HbA1C: No results for input(s): HGBA1C in the last 72 hours. CBG: No results for input(s): GLUCAP in the last 168 hours. Lipid Profile: No results for input(s): CHOL, HDL, LDLCALC, TRIG, CHOLHDL, LDLDIRECT in the last 72 hours. Thyroid Function Tests: No results for input(s): TSH, T4TOTAL, FREET4, T3FREE, THYROIDAB in the last 72 hours. Anemia Panel: No results for input(s): VITAMINB12, FOLATE, FERRITIN, TIBC, IRON, RETICCTPCT in the last 72 hours. Sepsis Labs: No results for input(s): PROCALCITON, LATICACIDVEN in the last 168 hours.  Recent Results (from the past 240 hour(s))  SARS CORONAVIRUS 2 (  TAT 6-24 HRS) Nasopharyngeal Nasopharyngeal Swab     Status: None   Collection Time: 09/06/19  4:14 PM   Specimen: Nasopharyngeal Swab  Result Value Ref Range Status   SARS Coronavirus 2 NEGATIVE NEGATIVE Final    Comment: (NOTE) SARS-CoV-2 target nucleic acids are NOT DETECTED. The SARS-CoV-2 RNA is generally detectable in upper and lower respiratory specimens during the acute phase of infection. Negative results do not preclude SARS-CoV-2 infection, do not rule out co-infections with other pathogens, and should not be used as the sole basis for treatment or other patient management decisions. Negative results must be combined with clinical observations, patient history, and epidemiological information. The expected result is Negative. Fact Sheet for Patients: SugarRoll.be Fact Sheet for Healthcare Providers: https://www.woods-mathews.com/ This test is not yet approved or cleared by the [place] FDA and   has been authorized for detection and/or diagnosis of SARS-CoV-2 by FDA under an Emergency Use Authorization (EUA). This EUA will remain  in effect (meaning this test can be used) for the duration of the COVID-19 declaration under Section 56 4(b)(1) of the Act, 21 U.S.C. section 360bbb-3(b)(1), unless the authorization is terminated or revoked sooner. Performed at [facility], [place] [address] [place], [place] [address]          Radiology Studies: VAS [place] LOWER EXTREMITY VENOUS (DVT)  Result Date: 09/11/2019  Lower Venous DVTStudy Indications: Pain, and Swelling.  Comparison Study: No prior exam. Performing Technologist: [place] ARDMS, RVT  Examination Guidelines: A complete evaluation includes B-mode imaging, spectral Doppler, color Doppler, and power Doppler as needed of all accessible portions of each vessel. Bilateral testing is considered an integral part of a complete examination. Limited examinations for reoccurring indications may be performed as noted. The reflux portion of the exam is performed with the patient in reverse Trendelenburg.  +---------+---------------+---------+-----------+----------+--------------+ RIGHT    CompressibilityPhasicitySpontaneityPropertiesThrombus Aging +---------+---------------+---------+-----------+----------+--------------+ CFV      Full           Yes      Yes                                 +---------+---------------+---------+-----------+----------+--------------+ SFJ      Full                                                        +---------+---------------+---------+-----------+----------+--------------+ FV Prox  Full                                                        +---------+---------------+---------+-----------+----------+--------------+ FV Mid   Full                                                        +---------+---------------+---------+-----------+----------+--------------+ FV DistalFull                                                         +---------+---------------+---------+-----------+----------+--------------+  PFV      Full                                                        +---------+---------------+---------+-----------+----------+--------------+ POP      Full           Yes      Yes                                 +---------+---------------+---------+-----------+----------+--------------+ PTV      Full                                                        +---------+---------------+---------+-----------+----------+--------------+ PERO     Full                                                        +---------+---------------+---------+-----------+----------+--------------+   +---------+---------------+---------+-----------+----------+--------------+ LEFT     CompressibilityPhasicitySpontaneityPropertiesThrombus Aging +---------+---------------+---------+-----------+----------+--------------+ CFV      Full           Yes      Yes                                 +---------+---------------+---------+-----------+----------+--------------+ SFJ      Full                                                        +---------+---------------+---------+-----------+----------+--------------+ FV Prox  Full                                                        +---------+---------------+---------+-----------+----------+--------------+ FV Mid   Full                                                        +---------+---------------+---------+-----------+----------+--------------+ FV DistalFull                                                        +---------+---------------+---------+-----------+----------+--------------+ PFV      Full                                                        +---------+---------------+---------+-----------+----------+--------------+  POP      Full           Yes      Yes                                  +---------+---------------+---------+-----------+----------+--------------+ PTV      Full                                                        +---------+---------------+---------+-----------+----------+--------------+ PERO     Full                                                        +---------+---------------+---------+-----------+----------+--------------+     Summary: BILATERAL: - No evidence of deep vein thrombosis seen in the lower extremities, bilaterally.  RIGHT: - No cystic structure found in the popliteal fossa.  LEFT: - No cystic structure found in the popliteal fossa.  *See table(s) above for measurements and observations.    Preliminary         Scheduled Meds: . brimonidine  1 drop Right Eye BID  . carvedilol  6.25 mg Oral BID WC  . hydroxypropyl methylcellulose / hypromellose  1 drop Both Eyes BID  . multivitamin  1 tablet Oral Daily  . pantoprazole  40 mg Oral Daily  . sodium chloride flush  3 mL Intravenous Q12H  . timolol  1 drop Right Eye BID  . [START ON 09/12/2019] torsemide  80 mg Oral BID  . vitamin B-12  1,000 mcg Oral BID  . warfarin  3 mg Oral ONCE-1800  . Warfarin - Pharmacist Dosing Inpatient   Does not apply q1800   Continuous Infusions: . sodium chloride 250 mL (09/11/19 1129)     LOS: 5 days     Hosie Poisson, MD [facility]   To contact the attending provider between 7A-7P or the covering provider during after hours 7P-7A, please log into the web site [email or web address] and access using universal [place] password for that web site. If you do not have the password, please call the hospital operator.  09/11/2019, 5:22 PM

## 2019-09-11 NOTE — Progress Notes (Signed)
Pt's BP was 84/48 at 13:19. Asymptomatic. Notified Cardiology.

## 2019-09-11 NOTE — TOC Initial Note (Addendum)
Transition of Care [facility]) - Initial/Assessment Note    Patient Details  Name: [name] MRN: [id number] Date of Birth: 05/19/1923  [facility] CM/SW Contact:    Zenon Mayo, RN Phone Number: 09/11/2019, 4:41 PM  Clinical Narrative:                 NCM spoke with patient, she states she would like to go to SNF , states she has been to [facility] before and would like to go to [facility].  She would like NCM to contact her son also.  NCM gave this informaton to [place] regarding [facility] to fax information, patient has given permission for information to be faxed out to facilities. NCM informed Biochemist, clinical to order covid test. NCM contacted son, Alvester Chou to inform him that patient wants to go to SNF and she perfers [facility].  NCM faxed to [facility] [phone].  Expected Discharge Plan: Skilled Nursing Facility Barriers to Discharge: Continued Medical Work up   Patient Goals and CMS Choice Patient states their goals for this hospitalization and ongoing recovery are:: go to SNF to get rehab CMS Medicare.gov Compare Post Acute Care list provided to:: Patient Choice offered to / list presented to : Patient  Expected Discharge Plan and Services Expected Discharge Plan: [place]   Discharge Planning Services: CM Consult Post Acute Care Choice: [place] Living arrangements for the past 2 months: Single Family Home                                      Prior Living Arrangements/Services Living arrangements for the past 2 months: Single Family Home Lives with:: Self Patient language and need for interpreter reviewed:: Yes Do you feel safe going back to the place where you live?: Yes      Need for Family Participation in Patient Care: Yes (Comment) Care giver support system in place?: Yes (comment)   Criminal Activity/Legal Involvement Pertinent to Current Situation/Hospitalization: No - Comment as  needed  Activities of Daily Living      Permission Sought/Granted                  Emotional Assessment Appearance:: Appears stated age Attitude/Demeanor/Rapport: Engaged Affect (typically observed): Appropriate Orientation: : Oriented to Self, Oriented to Place, Oriented to  Time, Oriented to Situation Alcohol / Substance Use: Not Applicable Psych Involvement: No (comment)  Admission diagnosis:  CHF (congestive heart failure) (HCC) [I50.9] AKI (acute kidney injury) ([place]) [N17.9] Bilateral lower extremity edema [R60.0] Acute on chronic congestive heart failure, unspecified heart failure type ([place]) [I50.9] Patient Active Problem List   Diagnosis Date Noted  . Pressure injury of skin 09/07/2019  . CHF (congestive heart failure) ([place]) 09/06/2019  . Chronic anticoagulation 09/06/2019  . Supratherapeutic INR 09/06/2019  . Carpal tunnel syndrome of right wrist 05/17/2019  . Pain in right hand 05/15/2019  . Encounter for therapeutic drug monitoring 03/23/2019  . Pacemaker 03/14/2019  . Chronic atrial fibrillation ([place]) 03/14/2019  . Complete heart block ([place]) 11/06/2016  . Palpitations 10/27/2016  . Coronary artery disease 04/29/2016  . AKI (acute kidney injury) ([place]) 09/25/2015  . Indigestion 09/25/2015  . PCP NOTES >>>>>>>>>>>>>>>>>> 04/09/2015  . CKD (chronic kidney disease) stage 4, GFR 15-29 ml/min (HCC) 08/14/2013  . Ectopic atrial rhythm 08/14/2013  . Acute on chronic diastolic (congestive) heart failure ([place])  03/03/2012  . Fractures involving multiple body regions 12/22/2011  . Idiopathic thrombocytopenic purpura ([place]) 09/25/2011  . Edema   . ARTHRALGIA 11/14/2009  . CARDIOMYOPATHY, PRIMARY, DILATED 10/07/2009  . URINARY INCONTINENCE 03/11/2009  . *ANEMIA, PERNICIOUS 05/22/2008  . Takotsubo syndrome 05/22/2008  . SUPRAVENTRICULAR TACHYCARDIA, PAROXYSMAL, HX OF 05/22/2008  . POPLITEAL CYST 11/07/2007  . *Essential hypertension 09/19/2007  . PHLEBITIS  09/19/2007  . ISCHEMIC COLITIS, HX OF 09/19/2007  . 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 *Osteoporosis 08/19/2006  . COLONIC POLYPS, HX OF 08/19/2006   PCP:  Colon Branch, MD Pharmacy:   [id number] [place], [place] [address] Phone: [phone] Fax: [phone]     Social Determinants of Health (SDOH) Interventions    Readmission Risk Interventions No flowsheet data found.

## 2019-09-11 NOTE — Progress Notes (Signed)
Physical Therapy Treatment Patient Details Name: [name] MRN: [id number] DOB: [birth year] Today's Date: 09/11/2019    History of Present Illness 86 y.o. female admitted on 09/06/19 for acute on chronic diastolic heart failure.  Pt with significant PMH of lumbar stenosis, CHF, CAD, A-flutter, lumbar stenosis, HTN, c-spine fxs, back surgery, pacemaker.      PT Comments    Patient progressing with mobility slowly.  Tolerated better than when she got up earlier today per her report due to LE pain.  She lost her balance backing up to the chair and needed min A to prevent falling.  Feel she remains appropriate for SNF level rehab upon d/c.    Follow Up Recommendations  SNF     Equipment Recommendations  None recommended by PT    Recommendations for Other Services       Precautions / Restrictions Precautions Precautions: Fall Precaution Comments: h/o falls    Mobility  Bed Mobility Overal bed mobility: Needs Assistance Bed Mobility: Rolling;Sidelying to Sit   Sidelying to sit: Min assist Supine to sit: Min assist     General bed mobility comments: cues for technique, assist for turning hips to roll and to lift trunk  Transfers Overall transfer level: Needs assistance Equipment used: Rolling walker (2 wheeled) Transfers: Sit to/from [facility] Sit to Stand: Min assist;From elevated surface Stand pivot transfers: Min guard       General transfer comment: rocking forward to assist with sit to stand and min A for anterior weight shift; stand step to [facility] with A for balance with RW  Ambulation/Gait Ambulation/Gait assistance: Min guard;Supervision;Min assist Gait Distance (Feet): 25 Feet Assistive device: Rolling walker (2 wheeled) Gait Pattern/deviations: Shuffle;Decreased stride length;Trunk flexed;Step-through pattern     General Gait Details: to door and back around to bed, pt with S for 70% of the time ambulating, but min A for backing to chair  due to posterior LOB   Stairs             Wheelchair Mobility    Modified Rankin (Stroke Patients Only)       Balance Overall balance assessment: Needs assistance Sitting-balance support: Feet supported Sitting balance-Leahy Scale: Good     Standing balance support: Bilateral upper extremity supported Standing balance-Leahy Scale: Poor Standing balance comment: standing with UE support assisted for hygiene, assist for donning depends                            Cognition Arousal/Alertness: Awake/alert Behavior During Therapy: WFL for tasks assessed/performed Overall Cognitive Status: Within Functional Limits for tasks assessed                                        Exercises      General Comments General comments (skin integrity, edema, etc.): VSS with activity      Pertinent Vitals/Pain Pain Assessment: Faces Faces Pain Scale: Hurts little more Pain Location: both legs Pain Descriptors / Indicators: Aching;Tender;Discomfort Pain Intervention(s): Monitored during session;Repositioned    Home Living                      Prior Function            PT Goals (current goals can now be found in the care plan section) Progress towards PT goals: Progressing toward goals  Frequency    Min 3X/week      PT Plan Current plan remains appropriate    Co-evaluation              AM-PAC PT "6 Clicks" Mobility   Outcome Measure  Help needed turning from your back to your side while in a flat bed without using bedrails?: A Little Help needed moving from lying on your back to sitting on the side of a flat bed without using bedrails?: A Little Help needed moving to and from a bed to a chair (including a wheelchair)?: A Little Help needed standing up from a chair using your arms (e.g., wheelchair or bedside chair)?: A Little Help needed to walk in hospital room?: A Little Help needed climbing 3-5 steps with a railing? :  A Little 6 Click Score: 18    End of Session Equipment Utilized During Treatment: Gait belt Activity Tolerance: Patient tolerated treatment well Patient left: in chair;with call bell/phone within reach;with chair alarm set Nurse Communication: Mobility status;Other (comment)(need for back to bed when she calls) PT Visit Diagnosis: Muscle weakness (generalized) (M62.81);Difficulty in walking, not elsewhere classified (R26.2)     Time: 1000-1035 PT Time Calculation (min) (ACUTE ONLY): 35 min  Charges:  $Gait Training: 8-22 mins $Therapeutic Activity: 8-22 mins                     [name], [place] [phone] 09/11/2019    [name] 09/11/2019, 12:54 PM

## 2019-09-11 NOTE — Progress Notes (Addendum)
Advanced Heart Failure Rounding Note  PCP-Cardiologist: Glori Bickers, MD   Subjective:    Admitted with marked volume overload and started on IV lasix. Weight down from 149-->142  pounds.     Complaining of leg pain. Denies SOB. Says she wants to go to rehab.    Objective:   Weight Range: 64.6 kg Body mass index is 24.44 kg/m.   Vital Signs:   Temp:  [97.4 F (36.3 C)-97.8 F (36.6 C)] 97.7 F (36.5 C) (03/01 0927) Pulse Rate:  [65-84] 84 (03/01 0927) Resp:  [18-20] 18 (03/01 0927) BP: (99-118)/(58-66) 111/60 (03/01 0927) SpO2:  [94 %-98 %] 95 % (03/01 0927) Weight:  [64.6 kg] 64.6 kg (03/01 0450) Last BM Date: 09/10/19  Weight change: Filed Weights   09/09/19 0500 09/10/19 0454 09/11/19 0450  Weight: 66.8 kg 65.5 kg 64.6 kg    Intake/Output:   Intake/Output Summary (Last 24 hours) at 09/11/2019 1036 Last data filed at 09/10/2019 2300 Gross per 24 hour  Intake 600 ml  Output 1150 ml  Net -550 ml      Physical Exam    General:  Elderly. No resp difficulty. Sitting in the chair.  HEENT: normal Neck: supple. JVP 6-7. Carotids 2+ bilat; no bruits. No lymphadenopathy or thryomegaly appreciated. Cor: PMI nondisplaced. Irregular rate & rhythm. No rubs, gallops or murmurs. Lungs: clear Abdomen: soft, nontender, nondistended. No hepatosplenomegaly. No bruits or masses. Good bowel sounds. Extremities: no cyanosis, clubbing, rash, edema Neuro: alert & orientedx3, cranial nerves grossly intact. moves all 4 extremities w/o difficulty. Affect pleasant   Telemetry    Afib 70-80s Personally reviewed   EKG   n/a  Labs    CBC Recent Labs    09/10/19 0335  WBC 9.5  HGB 8.7*  HCT 26.8*  MCV 96.1  PLT 41*   Basic Metabolic Panel Recent Labs    09/10/19 0335 09/11/19 0343  NA 132* 134*  K 3.6 3.7  CL 89* 86*  CO2 33* 33*  GLUCOSE 99 107*  BUN 67* 74*  CREATININE 2.55* 2.78*  CALCIUM 9.2 9.4  MG  --  1.7   Liver Function Tests No  results for input(s): AST, ALT, ALKPHOS, BILITOT, PROT, ALBUMIN in the last 72 hours. No results for input(s): LIPASE, AMYLASE in the last 72 hours. Cardiac Enzymes No results for input(s): CKTOTAL, CKMB, CKMBINDEX, TROPONINI in the last 72 hours.  BNP: BNP (last 3 results) Recent Labs    08/09/19 1420 09/05/19 1612 09/06/19 1413  BNP 259.6* 374.0* 258.7*    ProBNP (last 3 results) Recent Labs    03/01/19 1517  PROBNP 479.0*     D-Dimer No results for input(s): DDIMER in the last 72 hours. Hemoglobin A1C No results for input(s): HGBA1C in the last 72 hours. Fasting Lipid Panel No results for input(s): CHOL, HDL, LDLCALC, TRIG, CHOLHDL, LDLDIRECT in the last 72 hours. Thyroid Function Tests No results for input(s): TSH, T4TOTAL, T3FREE, THYROIDAB in the last 72 hours.  Invalid input(s): FREET3  Other results:   Imaging    No results found.   Medications:     Scheduled Medications: . brimonidine  1 drop Right Eye BID  . carvedilol  6.25 mg Oral BID WC  . furosemide  80 mg Intravenous BID  . hydroxypropyl methylcellulose / hypromellose  1 drop Both Eyes BID  . ibuprofen  200 mg Oral Once  . metolazone  2.5 mg Oral Daily  . multivitamin  1 tablet Oral  Daily  . pantoprazole  40 mg Oral Daily  . sodium chloride flush  3 mL Intravenous Q12H  . timolol  1 drop Right Eye BID  . vitamin B-12  1,000 mcg Oral BID  . Warfarin - Pharmacist Dosing Inpatient   Does not apply q1800    Infusions: . sodium chloride      PRN Medications: sodium chloride, acetaminophen, docusate sodium, ondansetron (ZOFRAN) IV, sodium chloride flush     Assessment/Plan   1.Acute onChronic diastolic [id number] of Takotsubo cardiomyopathy, EF has recovered. - Echo 05/2019 EF 55-60%, RV mildly reduced  - R>>L HF symptoms, mainly systemic congestion. CXR w/o overt edema  - Volume status improved. Stop IV lasix and metolazone.  -Start torsemide 80 mg twice a day tomorrow.  -  Intolerant unna boots.    2. CAD: -Minimal branch vessel disease on cath - No chest pain.  - intolerant of statins - no ASA due to coumadin  3.Permanent AF with Tachybrady syndrome s/p [employer] PPM 11/06/16 -Rate controlled. - Continue coreg 6.25 mg bid -- INR down 1.8  - Continue coumadin.  Discussed with pharmacy.    4.HTN  - Stable.  - Continue Coreg 6.25 bid   5. AKI on CKD, Stage IV-V: - baseline SCr ~2.0-2.5 - 2.8 on admit. Todays creatinine 2.8  - Stop IV lasix + metolazone today.    6. Supratherapeutic INR:  - on coumadin for Afib - INR 1.8.   Planning for rehab and she is agreeable. HF Team will set up follow up. Has appointment on 3/9   Length of Stay: [place], NP  09/11/2019, 10:36 AM  Advanced Heart Failure Team Pager [phone] (M-F; 7a - 4p)  Please contact [facility] for night-coverage after hours (4p -7a ) and weekends on [email or web address]  Patient seen and examined with the above-signed Advanced Practice Provider and/or Housestaff. I personally reviewed laboratory data, imaging studies and relevant notes. I independently examined the patient and formulated the important aspects of the plan. I have edited the note to reflect any of my changes or salient points. I have personally discussed the plan with the patient and/or family.  Volume status improved. Creatinine slightly up from baseline. Remains weak.   Wanting to go to SNF   On exam  JVP 6-7 Cor IRR Lungs clear Ab soft NT Ext no edema  Ok for SNF when bed available.   Glori Bickers, MD  11:59 AM

## 2019-09-11 NOTE — Plan of Care (Signed)
  Problem: Education: Goal: Ability to demonstrate management of disease process will improve Outcome: Progressing   Problem: Cardiac: Goal: Ability to achieve and maintain adequate cardiopulmonary perfusion will improve Outcome: Progressing   Problem: Health Behavior/Discharge Planning: Goal: Ability to manage health-related needs will improve Outcome: Progressing   Problem: Safety: Goal: Ability to remain free from injury will improve Outcome: Progressing

## 2019-09-11 NOTE — NC FL2 (Signed)
[place] LEVEL OF CARE SCREENING TOOL     IDENTIFICATION  Patient Name: [name] Birthdate: [birth year] Sex: female Admission Date (Current Location): 09/06/2019  [facility] and [place] Number:  Herbalist and Address:  [facility] [place], [place] [address], [place], [place] [address]      Provider Number: [id number]  Attending Physician Name and Address:  Hosie Poisson, MD  Relative Name and Phone Number:  Bailie Courtney [phone]    Current Level of Care: Hospital Recommended Level of Care: [place] Prior Approval Number:    Date Approved/Denied:   PASRR Number: [id number]  Discharge Plan: SNF    Current Diagnoses: Patient Active Problem List   Diagnosis Date Noted  . Pressure injury of skin 09/07/2019  . CHF (congestive heart failure) ([place]) 09/06/2019  . Chronic anticoagulation 09/06/2019  . Supratherapeutic INR 09/06/2019  . Carpal tunnel syndrome of right wrist 05/17/2019  . Pain in right hand 05/15/2019  . Encounter for therapeutic drug monitoring 03/23/2019  . Pacemaker 03/14/2019  . Chronic atrial fibrillation ([place]) 03/14/2019  . Complete heart block ([place]) 11/06/2016  . Palpitations 10/27/2016  . Coronary artery disease 04/29/2016  . AKI (acute kidney injury) ([place]) 09/25/2015  . Indigestion 09/25/2015  . PCP NOTES >>>>>>>>>>>>>>>>>> 04/09/2015  . CKD (chronic kidney disease) stage 4, GFR 15-29 ml/min (HCC) 08/14/2013  . Ectopic atrial rhythm 08/14/2013  . Acute on chronic diastolic (congestive) heart failure ([place]) 03/03/2012  . Fractures involving multiple body regions 12/22/2011  . Idiopathic thrombocytopenic purpura ([place]) 09/25/2011  . Edema   . ARTHRALGIA 11/14/2009  . CARDIOMYOPATHY, PRIMARY, DILATED 10/07/2009  . URINARY INCONTINENCE 03/11/2009  . *ANEMIA, PERNICIOUS 05/22/2008  . Takotsubo syndrome 05/22/2008  . SUPRAVENTRICULAR TACHYCARDIA, PAROXYSMAL, HX OF 05/22/2008  . POPLITEAL  CYST 11/07/2007  . *Essential hypertension 09/19/2007  . PHLEBITIS 09/19/2007  . ISCHEMIC COLITIS, HX OF 09/19/2007  . 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 *Osteoporosis 08/19/2006  . COLONIC POLYPS, HX OF 08/19/2006    Orientation RESPIRATION BLADDER Height & Weight     Self, Time, Situation, Place    Incontinent, External catheter Weight: 64.6 kg(scale c) Height:  5\' 4"  (162.6 cm)  BEHAVIORAL SYMPTOMS/MOOD NEUROLOGICAL BOWEL NUTRITION STATUS      Continent Diet(See DC summary)  AMBULATORY STATUS COMMUNICATION OF NEEDS Skin   Extensive Assist Verbally PU Stage and Appropriate Care   PU Stage 2 Dressing: (PRN)                   Personal Care Assistance Level of Assistance  Bathing, Feeding, Dressing Bathing Assistance: Limited assistance Feeding assistance: Limited assistance Dressing Assistance: Limited assistance     Functional Limitations Info  Sight, Hearing, Speech Sight Info: Adequate Hearing Info: Adequate Speech Info: Adequate    SPECIAL CARE FACTORS FREQUENCY  PT (By licensed PT), OT (By licensed OT)     PT Frequency: 5x/week OT Frequency: 5x/week            Contractures Contractures Info: Not present    Additional Factors Info  Code Status, Allergies Code Status Info: DNR Allergies Info: Morphine And Related, Oxycodone, Penicillins, Clindamycin/lincomycin, Colchicine, Norvasc Amlodipine Besylate, Ofloxacin, Prednisone, Streptomycin, Tape, Tramadol, Alendronate Sodium, Amiodarone, Plaquenil Hydroxychloroquine Sulfate           Current Medications (09/11/2019):  This is the current hospital active medication list Current Facility-Administered Medications  Medication Dose Route Frequency Provider Last Rate Last Admin  . 0.9 %  sodium chloride  infusion  250 mL Intravenous PRN Wynetta Fines T, MD 10 mL/hr at 09/11/19 1129 250 mL at 09/11/19 1129  . acetaminophen (TYLENOL) tablet 650 mg  650 mg Oral Q4H PRN Lequita Halt, MD   650 mg at 09/11/19 0329  .  brimonidine (ALPHAGAN) 0.2 % ophthalmic solution 1 drop  1 drop Right Eye BID Lequita Halt, MD   1 drop at 09/11/19 0949  . carvedilol (COREG) tablet 6.25 mg  6.25 mg Oral BID WC Wynetta Fines T, MD   6.25 mg at 09/11/19 0929  . docusate sodium (COLACE) capsule 100 mg  100 mg Oral Daily PRN Wynetta Fines T, MD      . hydroxypropyl methylcellulose / hypromellose (ISOPTO TEARS / GONIOVISC) 2.5 % ophthalmic solution 1 drop  1 drop Both Eyes BID Wynetta Fines T, MD   1 drop at 09/11/19 0948  . multivitamin (PROSIGHT) tablet 1 tablet  1 tablet Oral Daily Lequita Halt, MD   1 tablet at 09/11/19 [phone]  . ondansetron (ZOFRAN) injection 4 mg  4 mg Intravenous Q6H PRN Wynetta Fines T, MD      . pantoprazole (PROTONIX) EC tablet 40 mg  40 mg Oral Daily Wynetta Fines T, MD   40 mg at 09/11/19 0929  . sodium chloride flush (NS) 0.9 % injection 3 mL  3 mL Intravenous Q12H Wynetta Fines T, MD   3 mL at 09/11/19 0950  . sodium chloride flush (NS) 0.9 % injection 3 mL  3 mL Intravenous PRN Wynetta Fines T, MD      . timolol (TIMOPTIC) 0.5 % ophthalmic solution 1 drop  1 drop Right Eye BID Lequita Halt, MD   1 drop at 09/11/19 0947  . [START ON 09/12/2019] torsemide (DEMADEX) tablet 80 mg  80 mg Oral BID Clegg, Amy D, NP      . vitamin B-12 (CYANOCOBALAMIN) tablet 1,000 mcg  1,000 mcg Oral BID Wynetta Fines T, MD   1,000 mcg at 09/11/19 0929  . warfarin (COUMADIN) tablet 3 mg  3 mg Oral ONCE-1800 Carney, Gay Filler, RPH      . Warfarin - Pharmacist Dosing Inpatient   Does not apply [id number] Lequita Halt, MD   Given at 09/08/19 1719     Discharge Medications: Please see discharge summary for a list of discharge medications.  Relevant Imaging Results:  Relevant Lab Results:   Additional Information [place]  Zenon Mayo, RN

## 2019-09-12 ENCOUNTER — Encounter (HOSPITAL_COMMUNITY): Payer: Medicare Other

## 2019-09-12 DIAGNOSIS — K5900 Constipation, unspecified: Secondary | ICD-10-CM | POA: Diagnosis not present

## 2019-09-12 DIAGNOSIS — Z515 Encounter for palliative care: Secondary | ICD-10-CM | POA: Diagnosis not present

## 2019-09-12 DIAGNOSIS — E875 Hyperkalemia: Secondary | ICD-10-CM | POA: Diagnosis present

## 2019-09-12 DIAGNOSIS — I482 Chronic atrial fibrillation, unspecified: Secondary | ICD-10-CM | POA: Diagnosis not present

## 2019-09-12 DIAGNOSIS — N183 Chronic kidney disease, stage 3 unspecified: Secondary | ICD-10-CM | POA: Diagnosis present

## 2019-09-12 DIAGNOSIS — M1990 Unspecified osteoarthritis, unspecified site: Secondary | ICD-10-CM | POA: Diagnosis not present

## 2019-09-12 DIAGNOSIS — K859 Acute pancreatitis without necrosis or infection, unspecified: Secondary | ICD-10-CM | POA: Diagnosis present

## 2019-09-12 DIAGNOSIS — N281 Cyst of kidney, acquired: Secondary | ICD-10-CM | POA: Diagnosis present

## 2019-09-12 DIAGNOSIS — D689 Coagulation defect, unspecified: Secondary | ICD-10-CM | POA: Diagnosis present

## 2019-09-12 DIAGNOSIS — I4891 Unspecified atrial fibrillation: Secondary | ICD-10-CM | POA: Diagnosis present

## 2019-09-12 DIAGNOSIS — R6521 Severe sepsis with septic shock: Secondary | ICD-10-CM | POA: Diagnosis present

## 2019-09-12 DIAGNOSIS — Z20822 Contact with and (suspected) exposure to covid-19: Secondary | ICD-10-CM | POA: Diagnosis present

## 2019-09-12 DIAGNOSIS — R2681 Unsteadiness on feet: Secondary | ICD-10-CM | POA: Diagnosis not present

## 2019-09-12 DIAGNOSIS — M2550 Pain in unspecified joint: Secondary | ICD-10-CM | POA: Diagnosis not present

## 2019-09-12 DIAGNOSIS — A4150 Gram-negative sepsis, unspecified: Secondary | ICD-10-CM | POA: Diagnosis present

## 2019-09-12 DIAGNOSIS — D649 Anemia, unspecified: Secondary | ICD-10-CM | POA: Diagnosis not present

## 2019-09-12 DIAGNOSIS — Z8679 Personal history of other diseases of the circulatory system: Secondary | ICD-10-CM | POA: Diagnosis not present

## 2019-09-12 DIAGNOSIS — B957 Other staphylococcus as the cause of diseases classified elsewhere: Secondary | ICD-10-CM | POA: Diagnosis present

## 2019-09-12 DIAGNOSIS — Z950 Presence of cardiac pacemaker: Secondary | ICD-10-CM | POA: Diagnosis not present

## 2019-09-12 DIAGNOSIS — I4821 Permanent atrial fibrillation: Secondary | ICD-10-CM | POA: Diagnosis not present

## 2019-09-12 DIAGNOSIS — I509 Heart failure, unspecified: Secondary | ICD-10-CM | POA: Diagnosis not present

## 2019-09-12 DIAGNOSIS — D693 Immune thrombocytopenic purpura: Secondary | ICD-10-CM | POA: Diagnosis present

## 2019-09-12 DIAGNOSIS — J9811 Atelectasis: Secondary | ICD-10-CM | POA: Diagnosis not present

## 2019-09-12 DIAGNOSIS — I495 Sick sinus syndrome: Secondary | ICD-10-CM | POA: Diagnosis present

## 2019-09-12 DIAGNOSIS — Z7401 Bed confinement status: Secondary | ICD-10-CM | POA: Diagnosis not present

## 2019-09-12 DIAGNOSIS — R680 Hypothermia, not associated with low environmental temperature: Secondary | ICD-10-CM | POA: Diagnosis present

## 2019-09-12 DIAGNOSIS — I5032 Chronic diastolic (congestive) heart failure: Secondary | ICD-10-CM | POA: Diagnosis not present

## 2019-09-12 DIAGNOSIS — B9620 Unspecified Escherichia coli [E. coli] as the cause of diseases classified elsewhere: Secondary | ICD-10-CM | POA: Diagnosis present

## 2019-09-12 DIAGNOSIS — I5043 Acute on chronic combined systolic (congestive) and diastolic (congestive) heart failure: Secondary | ICD-10-CM | POA: Diagnosis present

## 2019-09-12 DIAGNOSIS — L89616 Pressure-induced deep tissue damage of right heel: Secondary | ICD-10-CM | POA: Diagnosis present

## 2019-09-12 DIAGNOSIS — I472 Ventricular tachycardia: Secondary | ICD-10-CM | POA: Diagnosis not present

## 2019-09-12 DIAGNOSIS — I959 Hypotension, unspecified: Secondary | ICD-10-CM | POA: Diagnosis not present

## 2019-09-12 DIAGNOSIS — I5033 Acute on chronic diastolic (congestive) heart failure: Secondary | ICD-10-CM | POA: Diagnosis not present

## 2019-09-12 DIAGNOSIS — R41841 Cognitive communication deficit: Secondary | ICD-10-CM | POA: Diagnosis not present

## 2019-09-12 DIAGNOSIS — R5381 Other malaise: Secondary | ICD-10-CM | POA: Diagnosis not present

## 2019-09-12 DIAGNOSIS — I130 Hypertensive heart and chronic kidney disease with heart failure and stage 1 through stage 4 chronic kidney disease, or unspecified chronic kidney disease: Secondary | ICD-10-CM | POA: Diagnosis present

## 2019-09-12 DIAGNOSIS — G9340 Encephalopathy, unspecified: Secondary | ICD-10-CM | POA: Diagnosis present

## 2019-09-12 DIAGNOSIS — T68XXXA Hypothermia, initial encounter: Secondary | ICD-10-CM | POA: Diagnosis not present

## 2019-09-12 DIAGNOSIS — N179 Acute kidney failure, unspecified: Secondary | ICD-10-CM | POA: Diagnosis not present

## 2019-09-12 DIAGNOSIS — Z66 Do not resuscitate: Secondary | ICD-10-CM | POA: Diagnosis present

## 2019-09-12 DIAGNOSIS — I498 Other specified cardiac arrhythmias: Secondary | ICD-10-CM | POA: Diagnosis not present

## 2019-09-12 DIAGNOSIS — Z7901 Long term (current) use of anticoagulants: Secondary | ICD-10-CM | POA: Diagnosis not present

## 2019-09-12 DIAGNOSIS — A419 Sepsis, unspecified organism: Secondary | ICD-10-CM | POA: Diagnosis not present

## 2019-09-12 DIAGNOSIS — R531 Weakness: Secondary | ICD-10-CM | POA: Diagnosis not present

## 2019-09-12 DIAGNOSIS — N184 Chronic kidney disease, stage 4 (severe): Secondary | ICD-10-CM | POA: Diagnosis not present

## 2019-09-12 DIAGNOSIS — I429 Cardiomyopathy, unspecified: Secondary | ICD-10-CM | POA: Diagnosis not present

## 2019-09-12 DIAGNOSIS — X58XXXA Exposure to other specified factors, initial encounter: Secondary | ICD-10-CM | POA: Diagnosis not present

## 2019-09-12 DIAGNOSIS — L89156 Pressure-induced deep tissue damage of sacral region: Secondary | ICD-10-CM | POA: Diagnosis present

## 2019-09-12 DIAGNOSIS — G2581 Restless legs syndrome: Secondary | ICD-10-CM | POA: Diagnosis not present

## 2019-09-12 DIAGNOSIS — M6281 Muscle weakness (generalized): Secondary | ICD-10-CM | POA: Diagnosis not present

## 2019-09-12 DIAGNOSIS — N390 Urinary tract infection, site not specified: Secondary | ICD-10-CM | POA: Diagnosis present

## 2019-09-12 DIAGNOSIS — I441 Atrioventricular block, second degree: Secondary | ICD-10-CM | POA: Diagnosis not present

## 2019-09-12 DIAGNOSIS — G629 Polyneuropathy, unspecified: Secondary | ICD-10-CM | POA: Diagnosis not present

## 2019-09-12 DIAGNOSIS — I10 Essential (primary) hypertension: Secondary | ICD-10-CM | POA: Diagnosis not present

## 2019-09-12 DIAGNOSIS — I428 Other cardiomyopathies: Secondary | ICD-10-CM | POA: Diagnosis present

## 2019-09-12 DIAGNOSIS — I248 Other forms of acute ischemic heart disease: Secondary | ICD-10-CM | POA: Diagnosis present

## 2019-09-12 DIAGNOSIS — D696 Thrombocytopenia, unspecified: Secondary | ICD-10-CM | POA: Diagnosis not present

## 2019-09-12 LAB — BASIC METABOLIC PANEL
Anion gap: 12 (ref 5–15)
BUN: 80 mg/dL — ABNORMAL HIGH (ref 8–23)
CO2: 32 mmol/L (ref 22–32)
Calcium: 9.5 mg/dL (ref 8.9–10.3)
Chloride: 88 mmol/L — ABNORMAL LOW (ref 98–111)
Creatinine, Ser: 2.99 mg/dL — ABNORMAL HIGH (ref 0.44–1.00)
GFR calc Af Amer: 15 mL/min — ABNORMAL LOW (ref >60)
GFR calc non Af Amer: 13 mL/min — ABNORMAL LOW (ref >60)
Glucose, Bld: 90 mg/dL (ref 70–99)
Potassium: 4 mmol/L (ref 3.5–5.1)
Sodium: 132 mmol/L — ABNORMAL LOW (ref 135–145)

## 2019-09-12 LAB — CBC
HCT: 29.4 % — ABNORMAL LOW (ref 36.0–46.0)
Hemoglobin: 9.8 g/dL — ABNORMAL LOW (ref 12.0–15.0)
MCH: 31.6 pg (ref 26.0–34.0)
MCHC: 33.3 g/dL (ref 30.0–36.0)
MCV: 94.8 fL (ref 80.0–100.0)
Platelets: 52 10*3/uL — ABNORMAL LOW (ref 150–400)
RBC: 3.1 MIL/uL — ABNORMAL LOW (ref 3.87–5.11)
RDW: 14.8 % (ref 11.5–15.5)
WBC: 7.8 10*3/uL (ref 4.0–10.5)
nRBC: 0 % (ref 0.0–0.2)

## 2019-09-12 LAB — PROTIME-INR
INR: 1.7 — ABNORMAL HIGH (ref 0.8–1.2)
Prothrombin Time: 20.3 seconds — ABNORMAL HIGH (ref 11.4–15.2)

## 2019-09-12 LAB — SARS CORONAVIRUS 2 (TAT 6-24 HRS): SARS Coronavirus 2: NEGATIVE

## 2019-09-12 LAB — MAGNESIUM: Magnesium: 1.9 mg/dL (ref 1.7–2.4)

## 2019-09-12 MED ORDER — APIXABAN 2.5 MG PO TABS
2.5000 mg | ORAL_TABLET | Freq: Two times a day (BID) | ORAL | Status: DC
  Administered 2019-09-12: 2.5 mg via ORAL
  Filled 2019-09-12: qty 1

## 2019-09-12 MED ORDER — TORSEMIDE 20 MG PO TABS: 80.0000 mg | ORAL_TABLET | Freq: Two times a day (BID) | ORAL | 5 refills | Status: AC

## 2019-09-12 MED ORDER — DICLOFENAC SODIUM 1 % EX GEL
2.0000 g | Freq: Four times a day (QID) | CUTANEOUS | Status: DC | PRN
  Administered 2019-09-12: 2 g via TOPICAL
  Filled 2019-09-12: qty 100

## 2019-09-12 MED ORDER — APIXABAN 2.5 MG PO TABS: 2.5000 mg | ORAL_TABLET | Freq: Two times a day (BID) | ORAL | 2 refills | Status: AC

## 2019-09-12 MED ORDER — WARFARIN SODIUM 3 MG PO TABS: 3.0000 mg | ORAL_TABLET | Freq: Once | ORAL | Status: DC

## 2019-09-12 NOTE — TOC Progression Note (Addendum)
Transition of Care [facility]) - Progression Note    Patient Details  Name: [name] MRN: [id number] Date of Birth: 05-Jan-1923  [facility]) CM/SW Contact  Zenon Mayo, RN Phone Number: 09/12/2019, 10:26 AM  Clinical Narrative:    NCM awaiting to hear back from [place] with [facility] to see if they can take patient today. NCM received call from [place] at [facility] , they can take patient today for SNF,  She will be going to room 8102.  Staff RN to call report to [phone] or [phone].     Expected Discharge Plan: [place] Barriers to Discharge: Continued Medical Work up  Expected Discharge Plan and Services Expected Discharge Plan: [place]   Discharge Planning Services: CM Consult Post Acute Care Choice: [place] Living arrangements for the past 2 months: Single Family Home                                       Social Determinants of Health (SDOH) Interventions    Readmission Risk Interventions No flowsheet data found.

## 2019-09-12 NOTE — Discharge Summary (Signed)
Physician Discharge Summary  [name] [id number] DOB: 1922-11-05 DOA: 09/06/2019  PCP: Colon Branch, MD  Admit date: 09/06/2019 Discharge date: 09/12/2019  Admitted From: Home.  Disposition:  SNF  Recommendations for Outpatient Follow-up:  1. Follow up with PCP in 1-2 weeks 2. Please obtain BMP/CBC in one week 3. Please follow up with cardiology as scheduled.     Discharge Condition:Guarded.  CODE STATUS:DNR  Diet recommendation: Heart Healthy   Brief/Interim Summary: 86 year old lady with prior h/o NICM, hypertension, permanent atrial fibrillation with tachybradycardia syndrome s/p pacemaker placement, chronic diastolic heart failure, NSTEMI presents to ED for worsening shortness of breath.  He was admitted to [facility] for acute on chronic diastolic heart failure.  Cardiology consulted for management of CHF. Patient seen and examined at bedside reports bilateral leg pain and does not feel like she can go home with home PT and is agreeable to SNF. PT reports pain in the legs.  Venous duplex of the lower extremities ordered,and have been negative for DVT.   Discharge Diagnoses:  Principal Problem:   Acute on chronic diastolic (congestive) heart failure (HCC) Active Problems:   *Essential hypertension   CKD (chronic kidney disease) stage 4, GFR 15-29 ml/min (HCC)   AKI (acute kidney injury) (HCC)   Complete heart block (HCC)   Pacemaker   Chronic atrial fibrillation (HCC)   Chronic anticoagulation   Supratherapeutic INR   Pressure injury of skin   Acute on chronic diastolic heart failure Admitted to telemetry and started the patient on IV Lasix 80 mg twice daily, diuresed appropriately cardiology transitioned IV Lasix to oral torsemide 80 mg twice daily to start from tomorrow.    Last echocardiogram in November 2020 showed left ventricular ejection fraction of 55 to 60%. Chest x-ray does not show any pulmonary edema at this time.   Recommend to continue with daily  weights, fluid restriction and strict intake and output.  Bilateral lower extremity pain: Venous duplex of the lower extremities to evaluate for DVT, it was negative.Marland Kitchen Marland KitchenCK levels wnl.  Suspect neuropathy. Pt on voltragen , resume the same.    History of coronary artery disease Patient denies any chest pain or shortness of breath at this time   Chronic atrial fibrillation, tachybradycardia syndrome s/p pacemaker in 2018 Rate controlled with Coreg Subtherapeutic INR with 1.7, change from coumadin to eliquis on discharge.  No obvious signs of bleeding.    Essential hypertension Blood pressure measured parameters are soft this morning.  Continue to monitor.   Stage IV CKD Slight worsening of creatinine from 2.5-2.9, possibly from diuresis.  Continue to monitor IV Lasix was discontinued.  And she was transitioned to oral torsemide 80 mg twice daily starting tomorrow.     Mild normocytic anemia Hemoglobin baseline between 9-10 and stable continue to monitor.  No obvious signs of bleeding.  Repeat CBC in the morning   Pressure injury present on admission. Pressure Injury 09/06/19 Buttocks Right;Left Stage 2 -  Partial thickness loss of dermis presenting as a shallow open injury with a red, pink wound bed without slough. (Active)  09/06/19 1815  Location: Buttocks  Location Orientation: Right;Left  Staging: Stage 2 -  Partial thickness loss of dermis presenting as a shallow open injury with a red, pink wound bed without slough.  Wound Description (Comments):   Present on Admission: Yes    Chronic thrombocytopenia Platelets are 52,000.  No signs of bleeding continue to monitor    Discharge Instructions  Discharge Instructions    (  HEART FAILURE PATIENTS) Call MD:  Anytime you have any of the following symptoms: 1) 3 pound weight gain in 24 hours or 5 pounds in 1 week 2) shortness of breath, with or without a dry hacking cough 3) swelling in the hands, feet or  stomach 4) if you have to sleep on extra pillows at night in order to breathe.   Complete by: As directed    Diet - low sodium heart healthy   Complete by: As directed    Discharge instructions   Complete by: As directed    Please follow up with PCP in one week. Please follow up with cardiology as scheduled.     Allergies as of 09/12/2019      Reactions   Morphine And Related Anaphylaxis, Nausea Only   Per family, made her violently ill   Oxycodone Other (See Comments)   Dizziness    Penicillins Swelling, Other (See Comments)   Joint edema Has patient had a PCN reaction causing immediate rash, facial/tongue/throat swelling, SOB or lightheadedness with hypotension: Yes Has patient had a PCN reaction causing severe rash involving mucus membranes or skin necrosis: No Has patient had a PCN reaction that required hospitalization: No Has patient had a PCN reaction occurring within the last 10 years: No If all of the above answers are "NO", then may proceed with Cephalosporin use.   Clindamycin/lincomycin Hives, Itching   Colchicine Other (See Comments)   Hair loss   Norvasc [amlodipine Besylate] Other (See Comments)   Reaction not recalled by the patient ??   Ofloxacin Other (See Comments)   Unknown   Prednisone Other (See Comments)   Blurred vision   Streptomycin Other (See Comments)   "was a long time ago" reaction not recalled   Tape Other (See Comments)   SKIN IS VERY THIN AND WILL TEAR AND BRUISE EASILY!!   Tramadol Itching   Alendronate Sodium Palpitations   Amiodarone Rash, Other (See Comments)   Possible rash and nervousness per patient   Plaquenil [hydroxychloroquine Sulfate] Rash      Medication List    STOP taking these medications   diphenhydrAMINE 25 MG tablet Commonly known as: BENADRYL   warfarin 3 MG tablet Commonly known as: Coumadin     TAKE these medications   apixaban 2.5 MG Tabs tablet Commonly known as: ELIQUIS Take 1 tablet (2.5 mg total) by mouth  2 (two) times daily.   beta carotene w/minerals tablet Take 1 tablet by mouth daily.   brimonidine 0.2 % ophthalmic solution Commonly known as: ALPHAGAN Place 1 drop into the right eye 2 (two) times daily.   CALCIUM 1200 PO Take 1,200 mg by mouth daily with breakfast.   carvedilol 12.5 MG tablet Commonly known as: COREG Take 0.5 tablets (6.25 mg total) by mouth 2 (two) times daily with a meal.   diclofenac Sodium 1 % Gel Commonly known as: VOLTAREN Apply 2 g topically 4 (four) times daily as needed. What changed: reasons to take this   docusate sodium 100 MG capsule Commonly known as: COLACE Take 100 mg by mouth daily as needed for mild constipation. Stool softener   omeprazole 20 MG capsule Commonly known as: PRILOSEC Take 20 mg by mouth daily before breakfast.   Osteo Bi-Flex Adv Joint Shield Tabs Take 1 tablet by mouth daily.   potassium chloride SA 20 MEQ tablet Commonly known as: KLOR-CON Take 1 tablet (20 mEq total) by mouth 2 (two) times daily.   Refresh Tears 0.5 % Soln  Generic drug: carboxymethylcellulose Place 1 drop into both eyes 2 (two) times daily.   timolol 0.5 % ophthalmic solution Commonly known as: TIMOPTIC Place 1 drop into the right eye 2 (two) times daily.   torsemide 20 MG tablet Commonly known as: Demadex Take 4 tablets (80 mg total) by mouth 2 (two) times daily. May take 1 extra tab for weight gain of 3lbs in 24 hours Start taking on: September 13, 2019 What changed:   how much to take  additional instructions   vitamin B-12 1000 MCG tablet Commonly known as: CYANOCOBALAMIN Take 1,000 mcg by mouth 2 (two) times daily.   Vitamin D3 75 MCG (3000 UT) Tabs Take 3,000 Units by mouth daily.      Follow-up Information    Colon Branch, MD. Schedule an appointment as soon as possible for a visit in 1 week(s).   Specialty: Internal Medicine Contact information: [address] [place] [address] [phone]         Evans Lance, MD .   Specialty: Cardiology Contact information: [phone] [address] [place] [address] [phone]        Bensimhon, Shaune Pascal, MD .   Specialty: Cardiology Contact information: [place] [address] [phone]          Allergies  Allergen Reactions  . Morphine And Related Anaphylaxis and Nausea Only    Per family, made her violently ill  . Oxycodone Other (See Comments)    Dizziness   . Penicillins Swelling and Other (See Comments)    Joint edema Has patient had a PCN reaction causing immediate rash, facial/tongue/throat swelling, SOB or lightheadedness with hypotension: Yes Has patient had a PCN reaction causing severe rash involving mucus membranes or skin necrosis: No Has patient had a PCN reaction that required hospitalization: No Has patient had a PCN reaction occurring within the last 10 years: No If all of the above answers are "NO", then may proceed with Cephalosporin use.   . Clindamycin/Lincomycin Hives and Itching  . Colchicine Other (See Comments)    Hair loss  . Norvasc [Amlodipine Besylate] Other (See Comments)    Reaction not recalled by the patient ??  . Ofloxacin Other (See Comments)    Unknown   . Prednisone Other (See Comments)    Blurred vision  . Streptomycin Other (See Comments)    "was a long time ago" reaction not recalled  . Tape Other (See Comments)    SKIN IS VERY THIN AND WILL TEAR AND BRUISE EASILY!!  . Tramadol Itching  . Alendronate Sodium Palpitations  . Amiodarone Rash and Other (See Comments)    Possible rash and nervousness per patient  . Plaquenil [Hydroxychloroquine Sulfate] Rash    Consultations:  cardiology   Procedures/Studies: US RENAL  Result Date: 09/06/2019 CLINICAL DATA:  Acute kidney injury. EXAM: RENAL / URINARY TRACT ULTRASOUND COMPLETE COMPARISON:  06/23/2018 FINDINGS: Right Kidney: Renal measurements: 9.3 x 3.5 x 5.3 cm = volume: 90 mL.  Increased parenchymal echogenicity with cortical thinning. No mass or hydronephrosis visualized. Left Kidney: Renal measurements: 8.8 x 4.2 x 4.1 cm = volume: 79 mL. Increased parenchymal echogenicity with cortical thinning. No mass or hydronephrosis visualized. Bladder: Appears normal for degree of bladder distention. Other: Enlarged spleen measuring 13.8 cm in length with a volume of 570 cc. IMPRESSION: 1. Medical renal disease.  No hydronephrosis. 2. Splenomegaly. Electronically Signed   By: Seymour Bars.D.  On: 09/06/2019 17:49   DG Chest Port 1 View  Result Date: 09/06/2019 CLINICAL DATA:  Lower extremity edema. History of congestive heart failure EXAM: PORTABLE CHEST 1 VIEW COMPARISON:  June 16, 2018. FINDINGS: There is mild scarring in the right base. There is no edema or airspace opacity. Heart is upper normal in size with pulmonary vascularity normal. Pacemaker leads are attached to the right atrium and right ventricle. There is aortic atherosclerosis. No adenopathy. There old healed rib fractures on the right. There is a focal hiatal hernia. There is arthropathy in each shoulder. IMPRESSION: Scarring right base. No edema or airspace opacity. Stable cardiac silhouette. Pacemaker leads attached to right atrium and right ventricle. Aortic Atherosclerosis (ICD10-I70.0). Hiatal hernia present. Evidence of arthropathy in the shoulders and old rib trauma on the right. Electronically Signed   By: Lowella Grip III M.D.   On: 09/06/2019 14:56   VAS [place] ABI WITH/WO TBI  Result Date: 08/21/2019 LOWER EXTREMITY DOPPLER STUDY Indications: Peripheral artery disease. High Risk Factors: Hypertension, no history of smoking.  Performing Technologist: Delorise Shiner RVT  Examination Guidelines: A complete evaluation includes at minimum, Doppler waveform signals and systolic blood pressure reading at the level of bilateral brachial, anterior tibial, and posterior tibial arteries, when vessel segments are  accessible. Bilateral testing is considered an integral part of a complete examination. Photoelectric Plethysmograph (PPG) waveforms and toe systolic pressure readings are included as required and additional duplex testing as needed. Limited examinations for reoccurring indications may be performed as noted.  ABI Findings: +---------+------------------+-----+----------+--------+ Right    Rt Pressure (mmHg)IndexWaveform  Comment  +---------+------------------+-----+----------+--------+ Brachial 121                                       +---------+------------------+-----+----------+--------+ ATA      255               2.11                    +---------+------------------+-----+----------+--------+ PTA      255               2.11 monophasic         +---------+------------------+-----+----------+--------+ DP                              monophasic         +---------+------------------+-----+----------+--------+ Great Toe34                0.28                    +---------+------------------+-----+----------+--------+ +---------+------------------+-----+----------+-------+ Left     Lt Pressure (mmHg)IndexWaveform  Comment +---------+------------------+-----+----------+-------+ Brachial 116                                      +---------+------------------+-----+----------+-------+ ATA      255               2.11                   +---------+------------------+-----+----------+-------+ PTA      118               0.98 monophasic        +---------+------------------+-----+----------+-------+ DP  monophasic        +---------+------------------+-----+----------+-------+ Great Toe42                0.35                   +---------+------------------+-----+----------+-------+ +-------+----------------+-----------+------------+------------+ ABI/TBIToday's ABI     Today's TBIPrevious ABIPrevious TBI  +-------+----------------+-----------+------------+------------+ Right  Non compressible0.28                                +-------+----------------+-----------+------------+------------+ Left   Non compressible0.35                                +-------+----------------+-----------+------------+------------+  Summary: Right: Resting right ankle-brachial index indicates noncompressible right lower extremity arteries. The right toe-brachial index is abnormal. RT great toe pressure = 34 mmHg. Left: Resting left ankle-brachial index indicates noncompressible left lower extremity arteries. The left toe-brachial index is abnormal. LT Great toe pressure = 42 mmHg.  *See table(s) above for measurements and observations.  Electronically signed by Harold Barban MD on 08/21/2019 at 4:31:19 PM.    Final    VAS [place] LOWER EXTREMITY VENOUS (DVT)  Result Date: 09/11/2019  Lower Venous DVTStudy Indications: Pain, and Swelling.  Comparison Study: No prior exam. Performing Technologist: Baldwin Crown ARDMS, RVT  Examination Guidelines: A complete evaluation includes B-mode imaging, spectral Doppler, color Doppler, and power Doppler as needed of all accessible portions of each vessel. Bilateral testing is considered an integral part of a complete examination. Limited examinations for reoccurring indications may be performed as noted. The reflux portion of the exam is performed with the patient in reverse Trendelenburg.  +---------+---------------+---------+-----------+----------+--------------+ RIGHT    CompressibilityPhasicitySpontaneityPropertiesThrombus Aging +---------+---------------+---------+-----------+----------+--------------+ CFV      Full           Yes      Yes                                 +---------+---------------+---------+-----------+----------+--------------+ SFJ      Full                                                         +---------+---------------+---------+-----------+----------+--------------+ FV Prox  Full                                                        +---------+---------------+---------+-----------+----------+--------------+ FV Mid   Full                                                        +---------+---------------+---------+-----------+----------+--------------+ FV DistalFull                                                        +---------+---------------+---------+-----------+----------+--------------+  PFV      Full                                                        +---------+---------------+---------+-----------+----------+--------------+ POP      Full           Yes      Yes                                 +---------+---------------+---------+-----------+----------+--------------+ PTV      Full                                                        +---------+---------------+---------+-----------+----------+--------------+ PERO     Full                                                        +---------+---------------+---------+-----------+----------+--------------+   +---------+---------------+---------+-----------+----------+--------------+ LEFT     CompressibilityPhasicitySpontaneityPropertiesThrombus Aging +---------+---------------+---------+-----------+----------+--------------+ CFV      Full           Yes      Yes                                 +---------+---------------+---------+-----------+----------+--------------+ SFJ      Full                                                        +---------+---------------+---------+-----------+----------+--------------+ FV Prox  Full                                                        +---------+---------------+---------+-----------+----------+--------------+ FV Mid   Full                                                         +---------+---------------+---------+-----------+----------+--------------+ FV DistalFull                                                        +---------+---------------+---------+-----------+----------+--------------+ PFV      Full                                                        +---------+---------------+---------+-----------+----------+--------------+  POP      Full           Yes      Yes                                 +---------+---------------+---------+-----------+----------+--------------+ PTV      Full                                                        +---------+---------------+---------+-----------+----------+--------------+ PERO     Full                                                        +---------+---------------+---------+-----------+----------+--------------+     Summary: BILATERAL: - No evidence of deep vein thrombosis seen in the lower extremities, bilaterally.  RIGHT: - No cystic structure found in the popliteal fossa.  LEFT: - No cystic structure found in the popliteal fossa.  *See table(s) above for measurements and observations. Electronically signed by Ruta Hinds MD on 09/11/2019 at 5:55:03 PM.    Final       Subjective:  Some tenderness int he lower extremities.  Discharge Exam: Vitals:   09/12/19 0928 09/12/19 1130  BP: (!) 93/56 94/60  Pulse: 75 74  Resp:  16  Temp: (!) 97.4 F (36.3 C) (!) 97.4 F (36.3 C)  SpO2: 95% 95%   Vitals:   09/11/19 1947 09/12/19 0436 09/12/19 0928 09/12/19 1130  BP: 112/63 107/67 (!) 93/56 94/60  Pulse: 73 86 75 74  Resp: 18 18  16   Temp: 98.6 F (37 C) (!) 97.5 F (36.4 C) (!) 97.4 F (36.3 C) (!) 97.4 F (36.3 C)  TempSrc:  Oral Oral Oral  SpO2: 97% 97% 95% 95%  Weight:  64.3 kg    Height:        General: Pt is alert, awake, not in acute distress Cardiovascular: RRR, S1/S2 +, no rubs, no gallops Respiratory: CTA bilaterally, no wheezing, no rhonchi Abdominal: Soft, NT, ND,  bowel sounds + Extremities: no edema, no cyanosis    The results of significant diagnostics from this hospitalization (including imaging, microbiology, ancillary and laboratory) are listed below for reference.     Microbiology: Recent Results (from the past 240 hour(s))  SARS CORONAVIRUS 2 (TAT 6-24 HRS) Nasopharyngeal Nasopharyngeal Swab     Status: None   Collection Time: 09/06/19  4:14 PM   Specimen: Nasopharyngeal Swab  Result Value Ref Range Status   SARS Coronavirus 2 NEGATIVE NEGATIVE Final    Comment: (NOTE) SARS-CoV-2 target nucleic acids are NOT DETECTED. The SARS-CoV-2 RNA is generally detectable in upper and lower respiratory specimens during the acute phase of infection. Negative results do not preclude SARS-CoV-2 infection, do not rule out co-infections with other pathogens, and should not be used as the sole basis for treatment or other patient management decisions. Negative results must be combined with clinical observations, patient history, and epidemiological information. The expected result is Negative. Fact Sheet for Patients: SugarRoll.be Fact Sheet for Healthcare Providers: https://www.woods-mathews.com/ This test is not yet approved or cleared by the [place] FDA and  has  been authorized for detection and/or diagnosis of SARS-CoV-2 by FDA under an Emergency Use Authorization (EUA). This EUA will remain  in effect (meaning this test can be used) for the duration of the COVID-19 declaration under Section 56 4(b)(1) of the Act, 21 U.S.C. section 360bbb-3(b)(1), unless the authorization is terminated or revoked sooner. Performed at [facility], [place] [address] [place], [place] [address]   SARS CORONAVIRUS 2 (TAT 6-24 HRS) Nasopharyngeal Nasopharyngeal Swab     Status: None   Collection Time: 09/11/19  8:12 PM   Specimen: Nasopharyngeal Swab  Result Value Ref Range Status   SARS Coronavirus 2 NEGATIVE  NEGATIVE Final    Comment: (NOTE) SARS-CoV-2 target nucleic acids are NOT DETECTED. The SARS-CoV-2 RNA is generally detectable in upper and lower respiratory specimens during the acute phase of infection. Negative results do not preclude SARS-CoV-2 infection, do not rule out co-infections with other pathogens, and should not be used as the sole basis for treatment or other patient management decisions. Negative results must be combined with clinical observations, patient history, and epidemiological information. The expected result is Negative. Fact Sheet for Patients: SugarRoll.be Fact Sheet for Healthcare Providers: https://www.woods-mathews.com/ This test is not yet approved or cleared by the [place] FDA and  has been authorized for detection and/or diagnosis of SARS-CoV-2 by FDA under an Emergency Use Authorization (EUA). This EUA will remain  in effect (meaning this test can be used) for the duration of the COVID-19 declaration under Section 56 4(b)(1) of the Act, 21 U.S.C. section 360bbb-3(b)(1), unless the authorization is terminated or revoked sooner. Performed at [facility], [place] [address], [place], [place] [address]      Labs: BNP (last 3 results) Recent Labs    08/09/19 1420 09/05/19 1612 09/06/19 1413  BNP 259.6* 374.0* [id number]*   Basic Metabolic Panel: Recent Labs  Lab 09/07/19 0457 09/07/19 0457 09/08/19 0450 09/09/19 0639 09/10/19 0335 09/11/19 0343 09/12/19 0530  NA 138   < > 135 134* 132* 134* 132*  K 3.9   < > 3.9 3.9 3.6 3.7 4.0  CL 97*   < > 95* 93* 89* 86* 88*  CO2 29   < > 30 29 33* 33* 32  GLUCOSE 79   < > 106* 89 99 107* 90  BUN 63*   < > 65* 67* 67* 74* 80*  CREATININE 2.38*   < > 2.65* 2.51* 2.55* 2.78* 2.99*  CALCIUM 9.2   < > 8.9 9.0 9.2 9.4 9.5  MG 2.0  --   --   --   --  1.7 1.9   < > = values in this interval not displayed.   Liver Function Tests: Recent Labs  Lab  09/06/19 1831  AST 18  ALT 12  ALKPHOS 99  BILITOT 1.1  PROT 6.4*  ALBUMIN 2.8*   No results for input(s): LIPASE, AMYLASE in the last 168 hours. No results for input(s): AMMONIA in the last 168 hours. CBC: Recent Labs  Lab 09/06/19 1413 09/07/19 1822 09/08/19 0450 09/10/19 0335 09/12/19 0530  WBC 9.0 6.0 8.1 9.5 7.8  NEUTROABS 7.0  --   --   --   --   HGB 10.6* 9.2* 9.1* 8.7* 9.8*  HCT 32.8* 28.2* 27.8* 26.8* 29.4*  MCV 97.9 97.2 95.2 96.1 94.8  PLT 37* PLATELET CLUMPS NOTED ON SMEAR, UNABLE TO ESTIMATE 37* 41* 52*   Cardiac Enzymes: Recent Labs  Lab 09/11/19 0343  CKTOTAL <5*  BNP: Invalid input(s): POCBNP CBG: No results for input(s): GLUCAP in the last 168 hours. D-Dimer No results for input(s): DDIMER in the last 72 hours. Hgb A1c No results for input(s): HGBA1C in the last 72 hours. Lipid Profile No results for input(s): CHOL, HDL, LDLCALC, TRIG, CHOLHDL, LDLDIRECT in the last 72 hours. Thyroid function studies No results for input(s): TSH, T4TOTAL, T3FREE, THYROIDAB in the last 72 hours.  Invalid input(s): FREET3 Anemia work up No results for input(s): VITAMINB12, FOLATE, FERRITIN, TIBC, IRON, RETICCTPCT in the last 72 hours. Urinalysis    Component Value Date/Time   COLORURINE YELLOW 09/23/2015 2210   APPEARANCEUR CLOUDY (A) 09/23/2015 2210   LABSPEC 1.017 09/23/2015 2210   PHURINE 6.5 09/23/2015 2210   GLUCOSEU NEGATIVE 09/23/2015 2210   GLUCOSEU NEGATIVE 01/19/2012 1526   HGBUR SMALL (A) 09/23/2015 2210   BILIRUBINUR NEGATIVE 09/23/2015 2210   KETONESUR 15 (A) 09/23/2015 2210   PROTEINUR NEGATIVE 09/23/2015 2210   UROBILINOGEN 0.2 01/19/2012 1526   NITRITE POSITIVE (A) 09/23/2015 2210   LEUKOCYTESUR MODERATE (A) 09/23/2015 2210   Sepsis Labs Invalid input(s): PROCALCITONIN,  WBC,  LACTICIDVEN Microbiology Recent Results (from the past 240 hour(s))  SARS CORONAVIRUS 2 (TAT 6-24 HRS) Nasopharyngeal Nasopharyngeal Swab     Status: None    Collection Time: 09/06/19  4:14 PM   Specimen: Nasopharyngeal Swab  Result Value Ref Range Status   SARS Coronavirus 2 NEGATIVE NEGATIVE Final    Comment: (NOTE) SARS-CoV-2 target nucleic acids are NOT DETECTED. The SARS-CoV-2 RNA is generally detectable in upper and lower respiratory specimens during the acute phase of infection. Negative results do not preclude SARS-CoV-2 infection, do not rule out co-infections with other pathogens, and should not be used as the sole basis for treatment or other patient management decisions. Negative results must be combined with clinical observations, patient history, and epidemiological information. The expected result is Negative. Fact Sheet for Patients: SugarRoll.be Fact Sheet for Healthcare Providers: https://www.woods-mathews.com/ This test is not yet approved or cleared by the [place] FDA and  has been authorized for detection and/or diagnosis of SARS-CoV-2 by FDA under an Emergency Use Authorization (EUA). This EUA will remain  in effect (meaning this test can be used) for the duration of the COVID-19 declaration under Section 56 4(b)(1) of the Act, 21 U.S.C. section 360bbb-3(b)(1), unless the authorization is terminated or revoked sooner. Performed at [facility], [place] [address], [place], [place] [address]   SARS CORONAVIRUS 2 (TAT 6-24 HRS) Nasopharyngeal Nasopharyngeal Swab     Status: None   Collection Time: 09/11/19  8:12 PM   Specimen: Nasopharyngeal Swab  Result Value Ref Range Status   SARS Coronavirus 2 NEGATIVE NEGATIVE Final    Comment: (NOTE) SARS-CoV-2 target nucleic acids are NOT DETECTED. The SARS-CoV-2 RNA is generally detectable in upper and lower respiratory specimens during the acute phase of infection. Negative results do not preclude SARS-CoV-2 infection, do not rule out co-infections with other pathogens, and should not be used as the sole basis for treatment  or other patient management decisions. Negative results must be combined with clinical observations, patient history, and epidemiological information. The expected result is Negative. Fact Sheet for Patients: SugarRoll.be Fact Sheet for Healthcare Providers: https://www.woods-mathews.com/ This test is not yet approved or cleared by the [place] FDA and  has been authorized for detection and/or diagnosis of SARS-CoV-2 by FDA under an Emergency Use Authorization (EUA). This EUA will remain  in effect (meaning this test can be used)  for the duration of the COVID-19 declaration under Section 56 4(b)(1) of the Act, 21 U.S.C. section 360bbb-3(b)(1), unless the authorization is terminated or revoked sooner. Performed at [facility], [place] [address], [place], [place] [address]      Time coordinating discharge: 34 minutes.  SIGNED:   Hosie Poisson, MD  [facility] 09/12/2019, 11:40 AM

## 2019-09-12 NOTE — Progress Notes (Signed)
Paged [place] at [phone].Patient requesting ibuprofen for pain (hands & feet).stated works better than Tylenol. Upon followup Voltaren gel added. Upon callback no ibuprofen given renal function.   Called report to [place] at [facility] at 1307. Patient discharged with PTAR approx 1430. AVS and DNR provided to PTAR.

## 2019-09-12 NOTE — TOC Transition Note (Addendum)
Transition of Care [facility]) - CM/SW Discharge Note   Patient Details  Name: [name] MRN: [id number] Date of Birth: 1923/04/21  Transition of Care [facility]) CM/SW Contact:  Zenon Mayo, RN Phone Number: 09/12/2019, 1:02 PM   Clinical Narrative:    Patient for dc to [facility] today, Hilda Blades at [facility] , they can take patient today for SNF,  She will be going to room 8102.  Staff RN to call report to [phone] or [phone]. NCM notified Hester Mates and patient of dc to [facility] today.  Ptar scheduled for 2pm pickup. Ambulance forms on unit.  NCM gave patient the 30 day free eliquis coupon card also.      Final next level of care: Skilled Nursing Facility Barriers to Discharge: No Barriers Identified   Patient Goals and CMS Choice Patient states their goals for this hospitalization and ongoing recovery are:: SNF CMS Medicare.gov Compare Post Acute Care list provided to:: Patient Choice offered to / list presented to : Patient  Discharge Placement              Patient chooses bed at: [facility] Patient to be transferred to facility by: [place] Name of family member notified: Murle Hereford (son) Patient and family notified of of transfer: 09/12/19  Discharge Plan and Services   Discharge Planning Services: CM Consult Post Acute Care Choice: [place]          DME Arranged: (NA)         HH Arranged: NA          Social Determinants of Health (SDOH) Interventions     Readmission Risk Interventions No flowsheet data found.

## 2019-09-12 NOTE — Progress Notes (Signed)
[place] for warfarin Indication: atrial fibrillation  Allergies  Allergen Reactions  . Morphine And Related Anaphylaxis and Nausea Only    Per family, made her violently ill  . Oxycodone Other (See Comments)    Dizziness   . Penicillins Swelling and Other (See Comments)    Joint edema Has patient had a PCN reaction causing immediate rash, facial/tongue/throat swelling, SOB or lightheadedness with hypotension: Yes Has patient had a PCN reaction causing severe rash involving mucus membranes or skin necrosis: No Has patient had a PCN reaction that required hospitalization: No Has patient had a PCN reaction occurring within the last 10 years: No If all of the above answers are "NO", then may proceed with Cephalosporin use.   . Clindamycin/Lincomycin Hives and Itching  . Colchicine Other (See Comments)    Hair loss  . Norvasc [Amlodipine Besylate] Other (See Comments)    Reaction not recalled by the patient ??  . Ofloxacin Other (See Comments)    Unknown   . Prednisone Other (See Comments)    Blurred vision  . Streptomycin Other (See Comments)    "was a long time ago" reaction not recalled  . Tape Other (See Comments)    SKIN IS VERY THIN AND WILL TEAR AND BRUISE EASILY!!  . Tramadol Itching  . Alendronate Sodium Palpitations  . Amiodarone Rash and Other (See Comments)    Possible rash and nervousness per patient  . Plaquenil [Hydroxychloroquine Sulfate] Rash    Patient Measurements: Height: 5\' 4"  (162.6 cm) Weight: 141 lb 12.8 oz (64.3 kg) IBW/kg (Calculated) : 54.7 Heparin Dosing Weight:   Vital Signs: Temp: 97.4 F (36.3 C) (03/02 0928) Temp Source: Oral (03/02 0928) BP: 93/56 (03/02 0928) Pulse Rate: 75 (03/02 0928)  Labs: Recent Labs    09/10/19 0335 09/11/19 0343 09/12/19 0530  HGB 8.7*  --  9.8*  HCT 26.8*  --  29.4*  PLT 41*  --  52*  LABPROT 24.3* 21.1* 20.3*  INR 2.2* 1.8* 1.7*  CREATININE 2.55* 2.78* 2.99*   CKTOTAL  --  <5*  --     Estimated Creatinine Clearance: 9.9 mL/min (A) (by C-G formula based on SCr of 2.99 mg/dL (H)).   Medical History: Past Medical History:  Diagnosis Date  . Atrial fibrillation ([place])   . AV block, 1st degree   . CAD (coronary artery disease)    Non ST elevation 2005 ; LHC in 6/05 in setting of NSTEMI: ant apical AK and inf-apical AK, EF 29%, dOM2 70-80% (small); findings c/w Tako-Tsubo CM  . Cardiomyopathy, nonischemic ([place]) 04/2008   Likely Tako-Tsubo. Resolved. --dx'd on cath 2005. EF 29% with minimal distal CAD (small OM1 70-80).  Echo 10/09 EF 55%;  echo 01/26/12: EF 55%, mild LAE, PASP 34.  Marland Kitchen CHB (complete heart block) (HCC)    Intermittent, s/p Saint Jude PPM  . CHF (congestive heart failure) ([place])   . Colitis, ischemic ([place])   . Complication of anesthesia    post anesthesia excessive somnulence  . Compression fracture of C-spine ([place]) 10/10/2011   Fell at home, tx at [facility]  . Diarrhea associated with pseudomembranous colitis 6/11-17/2013   [facility]  . Dyslipidemia   . Fall    with non-healing rib fractures  . Glaucoma   . Granuloma annulare   . History of phlebitis   . Hypertension    SEVERE  . Idiopathic thrombocytopenic purpura (ITP) (HCC)   . Lower extremity edema   . Lumbar  stenosis 2004   DR. MARK ROY  . Multiple pelvic fractures ([place]) 05/2012   Dr Adaline Sill , [facility]  . Osteoporosis   . Renal disorder    Stage 3 kidney disease    Medications:  Scheduled:  . brimonidine  1 drop Right Eye BID  . carvedilol  6.25 mg Oral BID WC  . hydroxypropyl methylcellulose / hypromellose  1 drop Both Eyes BID  . multivitamin  1 tablet Oral Daily  . pantoprazole  40 mg Oral Daily  . sodium chloride flush  3 mL Intravenous Q12H  . timolol  1 drop Right Eye BID  . vitamin B-12  1,000 mcg Oral BID  . warfarin  3 mg Oral ONCE-1800  . Warfarin - Pharmacist Dosing Inpatient   Does not apply q1800   Infusions:  . sodium chloride 250 mL  (09/11/19 1129)    Assessment: [name] on warfarin PTA for AFib admitted with volume overload. INR 4.5 on admit, no active bleeding, H/H stable. Note recent dose adjustment for elevated INRs outpatient.  INR down to 1.7  *Home dose = 3mg  MWF, 1.5mg  TTSS  No overt bleeding or complications noted. CBC is stable but platelets are low. Pt appears to have chronic thrombocytopenia.  Goal of Therapy:  INR 2-3 Monitor platelets by anticoagulation protocol: Yes   Plan:  -Warfarin 3 mg x 1 tonight. -Monitor daily INR and CBC -Watch closely for S/Sx bleeding   [name], [facility] Clinical Pharmacist Phone [phone]  09/12/2019 9:43 AM

## 2019-09-12 NOTE — Progress Notes (Addendum)
Advanced Heart Failure Rounding Note  PCP-Cardiologist: Glori Bickers, MD   Subjective:    Admitted with marked volume overload and started on IV lasix. Weight down from 149-->141.8  pounds.     Complaining of leg pain. Denies SOB.    Objective:   Weight Range: 64.3 kg Body mass index is 24.34 kg/m.   Vital Signs:   Temp:  [97.2 F (36.2 C)-98.6 F (37 C)] 97.5 F (36.4 C) (03/02 0436) Pulse Rate:  [73-86] 86 (03/02 0436) Resp:  [11-18] 18 (03/02 0436) BP: (84-112)/(48-67) 107/67 (03/02 0436) SpO2:  [95 %-99 %] 97 % (03/02 0436) Weight:  [64.3 kg] 64.3 kg (03/02 0436) Last BM Date: 09/10/19  Weight change: Filed Weights   09/10/19 0454 09/11/19 0450 09/12/19 0436  Weight: 65.5 kg 64.6 kg 64.3 kg    Intake/Output:   Intake/Output Summary (Last 24 hours) at 09/12/2019 0923 Last data filed at 09/12/2019 0842 Gross per 24 hour  Intake 537.86 ml  Output 875 ml  Net -337.14 ml      Physical Exam    General:  Elderly frail No resp difficulty HEENT: normal anicteric  Neck: supple. JVP 5-6.  Carotids 2+ bilat; no bruits. No lymphadenopathy or thryomegaly appreciated. Cor: PMI nondisplaced. Irregular rate & rhythm. No rubs, gallops or murmurs. Lungs: clear Abdomen: soft, nontender, nondistended. No hepatosplenomegaly. No bruits or masses. Good bowel sounds. Extremities: no cyanosis, clubbing, rash, edema Neuro: alert & oriented x 3, cranial nerves grossly intact. moves all 4 extremities w/o difficulty. Affect pleasant    Telemetry   A fib 70-80s personally reviewed.    EKG   n/a  Labs    CBC Recent Labs    09/10/19 0335 09/12/19 0530  WBC 9.5 7.8  HGB 8.7* 9.8*  HCT 26.8* 29.4*  MCV 96.1 94.8  PLT 41* 52*   Basic Metabolic Panel Recent Labs    09/11/19 0343 09/12/19 0530  NA 134* 132*  K 3.7 4.0  CL 86* 88*  CO2 33* 32  GLUCOSE 107* 90  BUN 74* 80*  CREATININE 2.78* 2.99*  CALCIUM 9.4 9.5  MG 1.7 1.9   Liver Function  Tests No results for input(s): AST, ALT, ALKPHOS, BILITOT, PROT, ALBUMIN in the last 72 hours. No results for input(s): LIPASE, AMYLASE in the last 72 hours. Cardiac Enzymes Recent Labs    09/11/19 0343  CKTOTAL <5*    BNP: BNP (last 3 results) Recent Labs    08/09/19 1420 09/05/19 1612 09/06/19 1413  BNP 259.6* 374.0* 258.7*    ProBNP (last 3 results) Recent Labs    03/01/19 1517  PROBNP 479.0*     D-Dimer No results for input(s): DDIMER in the last 72 hours. Hemoglobin A1C No results for input(s): HGBA1C in the last 72 hours. Fasting Lipid Panel No results for input(s): CHOL, HDL, LDLCALC, TRIG, CHOLHDL, LDLDIRECT in the last 72 hours. Thyroid Function Tests No results for input(s): TSH, T4TOTAL, T3FREE, THYROIDAB in the last 72 hours.  Invalid input(s): FREET3  Other results:   Imaging    VAS [place] LOWER EXTREMITY VENOUS (DVT)  Result Date: 09/11/2019  Lower Venous DVTStudy Indications: Pain, and Swelling.  Comparison Study: No prior exam. Performing Technologist: Baldwin Crown ARDMS, RVT  Examination Guidelines: A complete evaluation includes B-mode imaging, spectral Doppler, color Doppler, and power Doppler as needed of all accessible portions of each vessel. Bilateral testing is considered an integral part of a complete examination. Limited examinations for reoccurring indications may  be performed as noted. The reflux portion of the exam is performed with the patient in reverse Trendelenburg.  +---------+---------------+---------+-----------+----------+--------------+ RIGHT    CompressibilityPhasicitySpontaneityPropertiesThrombus Aging +---------+---------------+---------+-----------+----------+--------------+ CFV      Full           Yes      Yes                                 +---------+---------------+---------+-----------+----------+--------------+ SFJ      Full                                                         +---------+---------------+---------+-----------+----------+--------------+ FV Prox  Full                                                        +---------+---------------+---------+-----------+----------+--------------+ FV Mid   Full                                                        +---------+---------------+---------+-----------+----------+--------------+ FV DistalFull                                                        +---------+---------------+---------+-----------+----------+--------------+ PFV      Full                                                        +---------+---------------+---------+-----------+----------+--------------+ POP      Full           Yes      Yes                                 +---------+---------------+---------+-----------+----------+--------------+ PTV      Full                                                        +---------+---------------+---------+-----------+----------+--------------+ PERO     Full                                                        +---------+---------------+---------+-----------+----------+--------------+   +---------+---------------+---------+-----------+----------+--------------+ LEFT     CompressibilityPhasicitySpontaneityPropertiesThrombus Aging +---------+---------------+---------+-----------+----------+--------------+ CFV      Full  Yes      Yes                                 +---------+---------------+---------+-----------+----------+--------------+ SFJ      Full                                                        +---------+---------------+---------+-----------+----------+--------------+ FV Prox  Full                                                        +---------+---------------+---------+-----------+----------+--------------+ FV Mid   Full                                                         +---------+---------------+---------+-----------+----------+--------------+ FV DistalFull                                                        +---------+---------------+---------+-----------+----------+--------------+ PFV      Full                                                        +---------+---------------+---------+-----------+----------+--------------+ POP      Full           Yes      Yes                                 +---------+---------------+---------+-----------+----------+--------------+ PTV      Full                                                        +---------+---------------+---------+-----------+----------+--------------+ PERO     Full                                                        +---------+---------------+---------+-----------+----------+--------------+     Summary: BILATERAL: - No evidence of deep vein thrombosis seen in the lower extremities, bilaterally.  RIGHT: - No cystic structure found in the popliteal fossa.  LEFT: - No cystic structure found in the popliteal fossa.  *See table(s) above for measurements and observations. Electronically signed by Ruta Hinds MD on 09/11/2019 at 5:55:03 PM.    Final      Medications:  Scheduled Medications: . brimonidine  1 drop Right Eye BID  . carvedilol  6.25 mg Oral BID WC  . hydroxypropyl methylcellulose / hypromellose  1 drop Both Eyes BID  . multivitamin  1 tablet Oral Daily  . pantoprazole  40 mg Oral Daily  . sodium chloride flush  3 mL Intravenous Q12H  . timolol  1 drop Right Eye BID  . torsemide  80 mg Oral BID  . vitamin B-12  1,000 mcg Oral BID  . Warfarin - Pharmacist Dosing Inpatient   Does not apply q1800    Infusions: . sodium chloride 250 mL (09/11/19 1129)    PRN Medications: sodium chloride, acetaminophen, docusate sodium, ondansetron (ZOFRAN) IV, sodium chloride flush     Assessment/Plan   1.Acute onChronic diastolic [id number] of Takotsubo  cardiomyopathy, EF has recovered. - Echo 05/2019 EF 55-60%, RV mildly reduced  - R>>L HF symptoms, mainly systemic congestion. CXR w/o overt edema  -Volume status stable. Creatinine/BUN up.  - Hold diuretics and start torsemide 80 mg twice a day tomorrow.  - Intolerant unna boots.    2. CAD: -Minimal branch vessel disease on cath - No chest pain.  - intolerant of statins - no ASA due to coumadin  3.Permanent AF with Tachybrady syndrome s/p St Jude PPM 11/06/16 -Rate controlled. - Continue coreg 6.25 mg bid -- INR down 1.87 - Continue coumadin.  Discussed with pharmacy.    4.HTN  - Stable.  - Continue Coreg 6.25 bid   5. AKI on CKD, Stage IV-V: - baseline SCr ~2.0-2.5 - 2.8 on admit. Todays creatinine 2.99 - Hold diuretics today.    6. Supratherapeutic INR:  - on coumadin for Afib - INR 1.7   Planning for rehab and she is agreeable. HF Team will set up follow up. Has appointment on 3/9   Length of Stay: [place], NP  09/12/2019, 9:23 AM  Advanced Heart Failure Team Pager [phone] (M-F; 7a - 4p)  Please contact [facility] for night-coverage after hours (4p -7a ) and weekends on [email or web address]   Patient seen and examined with the above-signed Advanced Practice Provider and/or Housestaff. I personally reviewed laboratory data, imaging studies and relevant notes. I independently examined the patient and formulated the important aspects of the plan. I have edited the note to reflect any of my changes or salient points. I have personally discussed the plan with the patient and/or family.  Volume status looks good. [place] for d/c from out standpoint. Agree with change to Eliquis 2.5 bid given difficulties with coumadin. Will f/u in HF Clinic.   Glori Bickers, MD  2:48 PM

## 2019-09-12 NOTE — Progress Notes (Signed)
[place] for warfarin > switch to Eliquis Indication: atrial fibrillation  Allergies  Allergen Reactions  . Morphine And Related Anaphylaxis and Nausea Only    Per family, made her violently ill  . Oxycodone Other (See Comments)    Dizziness   . Penicillins Swelling and Other (See Comments)    Joint edema Has patient had a PCN reaction causing immediate rash, facial/tongue/throat swelling, SOB or lightheadedness with hypotension: Yes Has patient had a PCN reaction causing severe rash involving mucus membranes or skin necrosis: No Has patient had a PCN reaction that required hospitalization: No Has patient had a PCN reaction occurring within the last 10 years: No If all of the above answers are "NO", then may proceed with Cephalosporin use.   . Clindamycin/Lincomycin Hives and Itching  . Colchicine Other (See Comments)    Hair loss  . Norvasc [Amlodipine Besylate] Other (See Comments)    Reaction not recalled by the patient ??  . Ofloxacin Other (See Comments)    Unknown   . Prednisone Other (See Comments)    Blurred vision  . Streptomycin Other (See Comments)    "was a long time ago" reaction not recalled  . Tape Other (See Comments)    SKIN IS VERY THIN AND WILL TEAR AND BRUISE EASILY!!  . Tramadol Itching  . Alendronate Sodium Palpitations  . Amiodarone Rash and Other (See Comments)    Possible rash and nervousness per patient  . Plaquenil [Hydroxychloroquine Sulfate] Rash    Patient Measurements: Height: 5\' 4"  (162.6 cm) Weight: 141 lb 12.8 oz (64.3 kg) IBW/kg (Calculated) : 54.7 Heparin Dosing Weight:   Vital Signs: Temp: 97.4 F (36.3 C) (03/02 1130) Temp Source: Oral (03/02 1130) BP: 94/60 (03/02 1130) Pulse Rate: 74 (03/02 1130)  Labs: Recent Labs    09/10/19 0335 09/11/19 0343 09/12/19 0530  HGB 8.7*  --  9.8*  HCT 26.8*  --  29.4*  PLT 41*  --  52*  LABPROT 24.3* 21.1* 20.3*  INR 2.2* 1.8* 1.7*  CREATININE  2.55* 2.78* 2.99*  CKTOTAL  --  <5*  --     Estimated Creatinine Clearance: 9.9 mL/min (A) (by C-G formula based on SCr of 2.99 mg/dL (H)).   Medical History: Past Medical History:  Diagnosis Date  . Atrial fibrillation ([place])   . AV block, 1st degree   . CAD (coronary artery disease)    Non ST elevation 2005 ; LHC in 6/05 in setting of NSTEMI: ant apical AK and inf-apical AK, EF 29%, dOM2 70-80% (small); findings c/w Tako-Tsubo CM  . Cardiomyopathy, nonischemic ([place]) 04/2008   Likely Tako-Tsubo. Resolved. --dx'd on cath 2005. EF 29% with minimal distal CAD (small OM1 70-80).  Echo 10/09 EF 55%;  echo 01/26/12: EF 55%, mild LAE, PASP 34.  Marland Kitchen CHB (complete heart block) (HCC)    Intermittent, s/p Saint Jude PPM  . CHF (congestive heart failure) ([place])   . Colitis, ischemic ([place])   . Complication of anesthesia    post anesthesia excessive somnulence  . Compression fracture of C-spine ([place]) 10/10/2011   Fell at home, tx at [facility]  . Diarrhea associated with pseudomembranous colitis 6/11-17/2013   [facility]  . Dyslipidemia   . Fall    with non-healing rib fractures  . Glaucoma   . Granuloma annulare   . History of phlebitis   . Hypertension    SEVERE  . Idiopathic thrombocytopenic purpura (ITP) (HCC)   . Lower extremity edema   .  Lumbar stenosis 2004   DR. MARK ROY  . Multiple pelvic fractures ([place]) 05/2012   Dr Adaline Sill , [facility]  . Osteoporosis   . Renal disorder    Stage 3 kidney disease    Medications:  Scheduled:  . apixaban  2.5 mg Oral BID  . brimonidine  1 drop Right Eye BID  . carvedilol  6.25 mg Oral BID WC  . hydroxypropyl methylcellulose / hypromellose  1 drop Both Eyes BID  . multivitamin  1 tablet Oral Daily  . pantoprazole  40 mg Oral Daily  . sodium chloride flush  3 mL Intravenous Q12H  . timolol  1 drop Right Eye BID  . vitamin B-12  1,000 mcg Oral BID   Infusions:  . sodium chloride 250 mL (09/11/19 1129)    Assessment: [name] on warfarin PTA  for AFib admitted with volume overload. INR 4.5 on admit, no active bleeding, H/H stable. Note recent dose adjustment for elevated INRs outpatient.  INR down to 1.7  *Home dose = 3mg  MWF, 1.5mg  TTSS  No overt bleeding or complications noted. CBC is stable but platelets are low. Pt appears to have chronic thrombocytopenia.  Pharmacy asked to switch to Eliquis today.  INR subtherapeutic, so can start Eliquis today.  Age > 80 and Scr > 1.5, will need reduced dosing.  Goal of Therapy:  INR 2-3 Monitor platelets by anticoagulation protocol: Yes   Plan:  -Stop Warfarin -Start Eliquis 2.5 mg BID.  Marguerite Ole[place], [facility] Clinical Pharmacist Phone [phone]  09/12/2019 11:38 AM

## 2019-09-12 NOTE — Care Management Important Message (Signed)
Important Message  Patient Details  Name: [name] MRN: [id number] Date of Birth: [birth year]   Medicare Important Message Given:  Yes     Shelda Altes 09/12/2019, 1:23 PM

## 2019-09-15 DIAGNOSIS — G2581 Restless legs syndrome: Secondary | ICD-10-CM | POA: Diagnosis not present

## 2019-09-15 DIAGNOSIS — G629 Polyneuropathy, unspecified: Secondary | ICD-10-CM | POA: Diagnosis not present

## 2019-09-15 DIAGNOSIS — N183 Chronic kidney disease, stage 3 unspecified: Secondary | ICD-10-CM | POA: Diagnosis not present

## 2019-09-15 DIAGNOSIS — I5032 Chronic diastolic (congestive) heart failure: Secondary | ICD-10-CM | POA: Diagnosis not present

## 2019-09-18 ENCOUNTER — Telehealth (HOSPITAL_COMMUNITY): Payer: Self-pay | Admitting: Cardiology

## 2019-09-18 DIAGNOSIS — G629 Polyneuropathy, unspecified: Secondary | ICD-10-CM | POA: Diagnosis not present

## 2019-09-18 DIAGNOSIS — K5900 Constipation, unspecified: Secondary | ICD-10-CM | POA: Diagnosis not present

## 2019-09-18 DIAGNOSIS — A4150 Gram-negative sepsis, unspecified: Secondary | ICD-10-CM | POA: Diagnosis not present

## 2019-09-18 DIAGNOSIS — R5381 Other malaise: Secondary | ICD-10-CM | POA: Diagnosis not present

## 2019-09-18 DIAGNOSIS — A419 Sepsis, unspecified organism: Secondary | ICD-10-CM | POA: Diagnosis not present

## 2019-09-18 DIAGNOSIS — X58XXXA Exposure to other specified factors, initial encounter: Secondary | ICD-10-CM | POA: Diagnosis not present

## 2019-09-18 DIAGNOSIS — K859 Acute pancreatitis without necrosis or infection, unspecified: Secondary | ICD-10-CM | POA: Diagnosis not present

## 2019-09-18 DIAGNOSIS — I248 Other forms of acute ischemic heart disease: Secondary | ICD-10-CM | POA: Diagnosis not present

## 2019-09-18 DIAGNOSIS — I472 Ventricular tachycardia: Secondary | ICD-10-CM | POA: Diagnosis not present

## 2019-09-18 DIAGNOSIS — J9811 Atelectasis: Secondary | ICD-10-CM | POA: Diagnosis not present

## 2019-09-18 DIAGNOSIS — N183 Chronic kidney disease, stage 3 unspecified: Secondary | ICD-10-CM | POA: Diagnosis not present

## 2019-09-18 DIAGNOSIS — Z20822 Contact with and (suspected) exposure to covid-19: Secondary | ICD-10-CM | POA: Diagnosis not present

## 2019-09-18 DIAGNOSIS — I482 Chronic atrial fibrillation, unspecified: Secondary | ICD-10-CM | POA: Diagnosis not present

## 2019-09-18 DIAGNOSIS — I498 Other specified cardiac arrhythmias: Secondary | ICD-10-CM | POA: Diagnosis not present

## 2019-09-18 DIAGNOSIS — Z950 Presence of cardiac pacemaker: Secondary | ICD-10-CM | POA: Diagnosis not present

## 2019-09-18 DIAGNOSIS — R6521 Severe sepsis with septic shock: Secondary | ICD-10-CM | POA: Diagnosis not present

## 2019-09-18 DIAGNOSIS — I10 Essential (primary) hypertension: Secondary | ICD-10-CM | POA: Diagnosis not present

## 2019-09-18 DIAGNOSIS — I495 Sick sinus syndrome: Secondary | ICD-10-CM | POA: Diagnosis not present

## 2019-09-18 DIAGNOSIS — R531 Weakness: Secondary | ICD-10-CM | POA: Diagnosis not present

## 2019-09-18 DIAGNOSIS — T68XXXA Hypothermia, initial encounter: Secondary | ICD-10-CM | POA: Diagnosis not present

## 2019-09-18 DIAGNOSIS — I130 Hypertensive heart and chronic kidney disease with heart failure and stage 1 through stage 4 chronic kidney disease, or unspecified chronic kidney disease: Secondary | ICD-10-CM | POA: Diagnosis not present

## 2019-09-18 DIAGNOSIS — I5032 Chronic diastolic (congestive) heart failure: Secondary | ICD-10-CM | POA: Diagnosis not present

## 2019-09-18 DIAGNOSIS — D649 Anemia, unspecified: Secondary | ICD-10-CM | POA: Diagnosis not present

## 2019-09-18 DIAGNOSIS — G2581 Restless legs syndrome: Secondary | ICD-10-CM | POA: Diagnosis not present

## 2019-09-18 DIAGNOSIS — I5043 Acute on chronic combined systolic (congestive) and diastolic (congestive) heart failure: Secondary | ICD-10-CM | POA: Diagnosis not present

## 2019-09-18 DIAGNOSIS — N179 Acute kidney failure, unspecified: Secondary | ICD-10-CM | POA: Diagnosis not present

## 2019-09-18 NOTE — Telephone Encounter (Signed)
Please ask him to contact MD at SNF. Needs to go ED if concerned about stroke.   Doranne Schmutz NP-C  4:02 PM

## 2019-09-18 NOTE — Telephone Encounter (Signed)
pts son called with multiple concerns regarding decline in health. Son reports she is not herself Increase in confusion Increase in weakness Tremors have started  Son has concerns for stroke and would like to speak with provider (Amy Clegg,NP)

## 2019-09-19 ENCOUNTER — Encounter (HOSPITAL_COMMUNITY): Payer: Medicare Other

## 2019-09-19 DIAGNOSIS — K8020 Calculus of gallbladder without cholecystitis without obstruction: Secondary | ICD-10-CM | POA: Diagnosis not present

## 2019-09-19 DIAGNOSIS — Z20822 Contact with and (suspected) exposure to covid-19: Secondary | ICD-10-CM | POA: Diagnosis present

## 2019-09-19 DIAGNOSIS — I517 Cardiomegaly: Secondary | ICD-10-CM | POA: Diagnosis not present

## 2019-09-19 DIAGNOSIS — R1310 Dysphagia, unspecified: Secondary | ICD-10-CM | POA: Diagnosis not present

## 2019-09-19 DIAGNOSIS — R5381 Other malaise: Secondary | ICD-10-CM | POA: Diagnosis not present

## 2019-09-19 DIAGNOSIS — I495 Sick sinus syndrome: Secondary | ICD-10-CM | POA: Diagnosis present

## 2019-09-19 DIAGNOSIS — Z7401 Bed confinement status: Secondary | ICD-10-CM | POA: Diagnosis not present

## 2019-09-19 DIAGNOSIS — I248 Other forms of acute ischemic heart disease: Secondary | ICD-10-CM | POA: Diagnosis present

## 2019-09-19 DIAGNOSIS — D689 Coagulation defect, unspecified: Secondary | ICD-10-CM | POA: Diagnosis present

## 2019-09-19 DIAGNOSIS — N281 Cyst of kidney, acquired: Secondary | ICD-10-CM | POA: Diagnosis present

## 2019-09-19 DIAGNOSIS — A4150 Gram-negative sepsis, unspecified: Secondary | ICD-10-CM | POA: Diagnosis present

## 2019-09-19 DIAGNOSIS — B957 Other staphylococcus as the cause of diseases classified elsewhere: Secondary | ICD-10-CM | POA: Diagnosis present

## 2019-09-19 DIAGNOSIS — N3289 Other specified disorders of bladder: Secondary | ICD-10-CM | POA: Diagnosis not present

## 2019-09-19 DIAGNOSIS — E43 Unspecified severe protein-calorie malnutrition: Secondary | ICD-10-CM | POA: Diagnosis not present

## 2019-09-19 DIAGNOSIS — I10 Essential (primary) hypertension: Secondary | ICD-10-CM | POA: Diagnosis not present

## 2019-09-19 DIAGNOSIS — I5043 Acute on chronic combined systolic (congestive) and diastolic (congestive) heart failure: Secondary | ICD-10-CM | POA: Diagnosis not present

## 2019-09-19 DIAGNOSIS — I361 Nonrheumatic tricuspid (valve) insufficiency: Secondary | ICD-10-CM | POA: Diagnosis not present

## 2019-09-19 DIAGNOSIS — R6521 Severe sepsis with septic shock: Secondary | ICD-10-CM | POA: Diagnosis not present

## 2019-09-19 DIAGNOSIS — D693 Immune thrombocytopenic purpura: Secondary | ICD-10-CM | POA: Diagnosis present

## 2019-09-19 DIAGNOSIS — Z66 Do not resuscitate: Secondary | ICD-10-CM | POA: Diagnosis present

## 2019-09-19 DIAGNOSIS — N2889 Other specified disorders of kidney and ureter: Secondary | ICD-10-CM | POA: Diagnosis not present

## 2019-09-19 DIAGNOSIS — A419 Sepsis, unspecified organism: Secondary | ICD-10-CM | POA: Diagnosis not present

## 2019-09-19 DIAGNOSIS — K859 Acute pancreatitis without necrosis or infection, unspecified: Secondary | ICD-10-CM | POA: Diagnosis present

## 2019-09-19 DIAGNOSIS — I5042 Chronic combined systolic (congestive) and diastolic (congestive) heart failure: Secondary | ICD-10-CM | POA: Diagnosis not present

## 2019-09-19 DIAGNOSIS — B9620 Unspecified Escherichia coli [E. coli] as the cause of diseases classified elsewhere: Secondary | ICD-10-CM | POA: Diagnosis present

## 2019-09-19 DIAGNOSIS — Z515 Encounter for palliative care: Secondary | ICD-10-CM | POA: Diagnosis not present

## 2019-09-19 DIAGNOSIS — I428 Other cardiomyopathies: Secondary | ICD-10-CM | POA: Diagnosis present

## 2019-09-19 DIAGNOSIS — N390 Urinary tract infection, site not specified: Secondary | ICD-10-CM | POA: Diagnosis present

## 2019-09-19 DIAGNOSIS — I4891 Unspecified atrial fibrillation: Secondary | ICD-10-CM | POA: Diagnosis not present

## 2019-09-19 DIAGNOSIS — G9340 Encephalopathy, unspecified: Secondary | ICD-10-CM | POA: Diagnosis present

## 2019-09-19 DIAGNOSIS — R680 Hypothermia, not associated with low environmental temperature: Secondary | ICD-10-CM | POA: Diagnosis present

## 2019-09-19 DIAGNOSIS — M2550 Pain in unspecified joint: Secondary | ICD-10-CM | POA: Diagnosis not present

## 2019-09-19 DIAGNOSIS — I130 Hypertensive heart and chronic kidney disease with heart failure and stage 1 through stage 4 chronic kidney disease, or unspecified chronic kidney disease: Secondary | ICD-10-CM | POA: Diagnosis not present

## 2019-09-19 DIAGNOSIS — N183 Chronic kidney disease, stage 3 unspecified: Secondary | ICD-10-CM | POA: Diagnosis not present

## 2019-09-19 DIAGNOSIS — I482 Chronic atrial fibrillation, unspecified: Secondary | ICD-10-CM | POA: Diagnosis not present

## 2019-09-19 DIAGNOSIS — I878 Other specified disorders of veins: Secondary | ICD-10-CM | POA: Diagnosis not present

## 2019-09-19 DIAGNOSIS — N179 Acute kidney failure, unspecified: Secondary | ICD-10-CM | POA: Diagnosis not present

## 2019-09-19 DIAGNOSIS — L89616 Pressure-induced deep tissue damage of right heel: Secondary | ICD-10-CM | POA: Diagnosis present

## 2019-09-19 DIAGNOSIS — Z436 Encounter for attention to other artificial openings of urinary tract: Secondary | ICD-10-CM | POA: Diagnosis not present

## 2019-09-19 DIAGNOSIS — E875 Hyperkalemia: Secondary | ICD-10-CM | POA: Diagnosis present

## 2019-09-19 DIAGNOSIS — L89626 Pressure-induced deep tissue damage of left heel: Secondary | ICD-10-CM | POA: Diagnosis not present

## 2019-09-19 DIAGNOSIS — I5020 Unspecified systolic (congestive) heart failure: Secondary | ICD-10-CM | POA: Diagnosis not present

## 2019-09-19 DIAGNOSIS — L89156 Pressure-induced deep tissue damage of sacral region: Secondary | ICD-10-CM | POA: Diagnosis present

## 2019-09-19 NOTE — Telephone Encounter (Signed)
Patients son Alvester Chou aware [name] (wife) via [phone].  Patient was transported to [facility] late last night -sepsis, wound on foot, b/p bottoms out -pt a DNR  Wife will call with updates at later time

## 2019-09-26 ENCOUNTER — Encounter (HOSPITAL_COMMUNITY): Payer: Medicare Other

## 2019-10-12 DEATH — deceased

## 2019-11-03 ENCOUNTER — Ambulatory Visit: Payer: Medicare Other | Admitting: Podiatry
